# Patient Record
Sex: Female | Born: 1950 | Race: White | Hispanic: No | Marital: Single | Smoking: Former smoker
Health system: Southern US, Community
[De-identification: ages and names within clinical notes are randomized; demographics above are authoritative.]

## PROBLEM LIST (undated history)

## (undated) DIAGNOSIS — G629 Polyneuropathy, unspecified: Secondary | ICD-10-CM

## (undated) DIAGNOSIS — R0602 Shortness of breath: Secondary | ICD-10-CM

## (undated) DIAGNOSIS — I509 Heart failure, unspecified: Secondary | ICD-10-CM

## (undated) DIAGNOSIS — J45909 Unspecified asthma, uncomplicated: Secondary | ICD-10-CM

## (undated) DIAGNOSIS — Z9289 Personal history of other medical treatment: Secondary | ICD-10-CM

## (undated) DIAGNOSIS — R000 Tachycardia, unspecified: Secondary | ICD-10-CM

## (undated) DIAGNOSIS — G8929 Other chronic pain: Secondary | ICD-10-CM

## (undated) DIAGNOSIS — J449 Chronic obstructive pulmonary disease, unspecified: Secondary | ICD-10-CM

## (undated) DIAGNOSIS — I10 Essential (primary) hypertension: Secondary | ICD-10-CM

## (undated) DIAGNOSIS — M1990 Unspecified osteoarthritis, unspecified site: Secondary | ICD-10-CM

## (undated) DIAGNOSIS — F419 Anxiety disorder, unspecified: Secondary | ICD-10-CM

## (undated) DIAGNOSIS — IMO0002 Reserved for concepts with insufficient information to code with codable children: Secondary | ICD-10-CM

## (undated) DIAGNOSIS — I4891 Unspecified atrial fibrillation: Secondary | ICD-10-CM

## (undated) DIAGNOSIS — E785 Hyperlipidemia, unspecified: Secondary | ICD-10-CM

## (undated) HISTORY — DX: Unspecified atrial fibrillation: I48.91

## (undated) HISTORY — DX: Other chronic pain: G89.29

## (undated) HISTORY — DX: Reserved for concepts with insufficient information to code with codable children: IMO0002

## (undated) HISTORY — DX: Hyperlipidemia, unspecified: E78.5

## (undated) HISTORY — DX: Tachycardia, unspecified: R00.0

## (undated) HISTORY — DX: Essential (primary) hypertension: I10

## (undated) HISTORY — PX: OTHER SURGICAL HISTORY: SHX169

## (undated) HISTORY — PX: ABDOMINAL HYSTERECTOMY: SHX81

## (undated) HISTORY — DX: Personal history of other medical treatment: Z92.89

## (undated) HISTORY — DX: Unspecified osteoarthritis, unspecified site: M19.90

## (undated) HISTORY — PX: CARPAL TUNNEL RELEASE: SHX101

## (undated) HISTORY — DX: Chronic obstructive pulmonary disease, unspecified: J44.9

## (undated) HISTORY — DX: Anxiety disorder, unspecified: F41.9

## (undated) HISTORY — DX: Unspecified asthma, uncomplicated: J45.909

## (undated) HISTORY — PX: TUBAL LIGATION: SHX77

## (undated) HISTORY — PX: TONSILLECTOMY: SUR1361

---

## 2002-04-16 ENCOUNTER — Encounter: Payer: Self-pay | Admitting: Emergency Medicine

## 2002-04-16 ENCOUNTER — Emergency Department (HOSPITAL_COMMUNITY): Admission: EM | Admit: 2002-04-16 | Discharge: 2002-04-17 | Payer: Self-pay | Admitting: Emergency Medicine

## 2002-08-14 ENCOUNTER — Encounter: Payer: Self-pay | Admitting: Family Medicine

## 2002-08-14 ENCOUNTER — Ambulatory Visit (HOSPITAL_COMMUNITY): Admission: RE | Admit: 2002-08-14 | Discharge: 2002-08-14 | Payer: Self-pay | Admitting: Family Medicine

## 2003-01-03 ENCOUNTER — Encounter: Payer: Self-pay | Admitting: Family Medicine

## 2003-01-03 ENCOUNTER — Ambulatory Visit (HOSPITAL_COMMUNITY): Admission: RE | Admit: 2003-01-03 | Discharge: 2003-01-03 | Payer: Self-pay | Admitting: Family Medicine

## 2003-01-25 ENCOUNTER — Encounter (HOSPITAL_COMMUNITY): Admission: RE | Admit: 2003-01-25 | Discharge: 2003-02-24 | Payer: Self-pay | Admitting: Specialist

## 2003-09-03 ENCOUNTER — Ambulatory Visit (HOSPITAL_COMMUNITY): Admission: RE | Admit: 2003-09-03 | Discharge: 2003-09-03 | Payer: Self-pay | Admitting: Family Medicine

## 2003-09-07 ENCOUNTER — Ambulatory Visit (HOSPITAL_COMMUNITY): Admission: RE | Admit: 2003-09-07 | Discharge: 2003-09-07 | Payer: Self-pay | Admitting: Family Medicine

## 2004-01-25 ENCOUNTER — Emergency Department (HOSPITAL_COMMUNITY): Admission: EM | Admit: 2004-01-25 | Discharge: 2004-01-26 | Payer: Self-pay | Admitting: Emergency Medicine

## 2004-05-22 ENCOUNTER — Ambulatory Visit (HOSPITAL_COMMUNITY)
Admission: RE | Admit: 2004-05-22 | Discharge: 2004-05-22 | Payer: Self-pay | Admitting: Physical Medicine and Rehabilitation

## 2005-01-29 ENCOUNTER — Ambulatory Visit (HOSPITAL_COMMUNITY)
Admission: RE | Admit: 2005-01-29 | Discharge: 2005-01-29 | Payer: Self-pay | Admitting: Physical Medicine and Rehabilitation

## 2005-07-27 ENCOUNTER — Ambulatory Visit (HOSPITAL_COMMUNITY): Admission: RE | Admit: 2005-07-27 | Discharge: 2005-07-27 | Payer: Self-pay | Admitting: Neurosurgery

## 2005-07-29 ENCOUNTER — Ambulatory Visit (HOSPITAL_COMMUNITY): Admission: RE | Admit: 2005-07-29 | Discharge: 2005-07-29 | Payer: Self-pay | Admitting: Neurosurgery

## 2007-02-21 ENCOUNTER — Emergency Department (HOSPITAL_COMMUNITY): Admission: EM | Admit: 2007-02-21 | Discharge: 2007-02-21 | Payer: Self-pay | Admitting: Emergency Medicine

## 2007-03-30 ENCOUNTER — Ambulatory Visit: Payer: Self-pay | Admitting: Orthopedic Surgery

## 2008-02-21 ENCOUNTER — Emergency Department (HOSPITAL_COMMUNITY): Admission: EM | Admit: 2008-02-21 | Discharge: 2008-02-21 | Payer: Self-pay | Admitting: Emergency Medicine

## 2008-05-30 ENCOUNTER — Ambulatory Visit (HOSPITAL_COMMUNITY): Admission: RE | Admit: 2008-05-30 | Discharge: 2008-05-30 | Payer: Self-pay | Admitting: Internal Medicine

## 2008-06-11 ENCOUNTER — Emergency Department (HOSPITAL_COMMUNITY): Admission: EM | Admit: 2008-06-11 | Discharge: 2008-06-11 | Payer: Self-pay | Admitting: Emergency Medicine

## 2008-09-14 ENCOUNTER — Inpatient Hospital Stay (HOSPITAL_COMMUNITY): Admission: EM | Admit: 2008-09-14 | Discharge: 2008-09-15 | Payer: Self-pay | Admitting: Emergency Medicine

## 2009-03-06 ENCOUNTER — Encounter: Admission: RE | Admit: 2009-03-06 | Discharge: 2009-03-06 | Payer: Self-pay | Admitting: Neurosurgery

## 2009-05-10 ENCOUNTER — Emergency Department (HOSPITAL_COMMUNITY): Admission: EM | Admit: 2009-05-10 | Discharge: 2009-05-10 | Payer: Self-pay | Admitting: Emergency Medicine

## 2009-11-16 IMAGING — RF IR MYELOGRAM [PERSON_NAME]
13 of 22 series · 13 of 22 positions shown · IV contrast (omnipaque)
Comparison: MRI examination lumbar spine 02/21/2008
COMPARISON: None

MYELOGRAM  INJECTION
TECHNIQUE: Informed consent was obtained from the patient prior to
the procedure, including potential complications of headache,
allergy, infection and pain. Specific instructions were given
regarding 24 hour bedrest post procedure to prevent post-LP
headache.  A timeout procedure was performed.  With the patient
prone, the lower back was prepped with Betadine.  1% Lidocaine was
used for local anesthesia.  Lumbar puncture was performed by the
radiologist at the L3 level using a 22 gauge needle with return of
clear CSF.  15 cc of Omnipaque 180 was injected into the
subarachnoid space .
CLINICAL DATA: The patient has undergone previous surgery.  She
has low back pain predominantly extending down her left leg.
TECHNIQUE: Multidetector CT imaging of the lumbar spine was
performed following myelography.  Multiplanar CT image
reconstructions were also generated.
CLINICAL DATA: Neck pain extending into the left arm.
TECHNIQUE: Multidetector CT imaging of the cervical spine was

[Series 1: (hospital) · 1 of 1 slices shown]
[im 1/1]
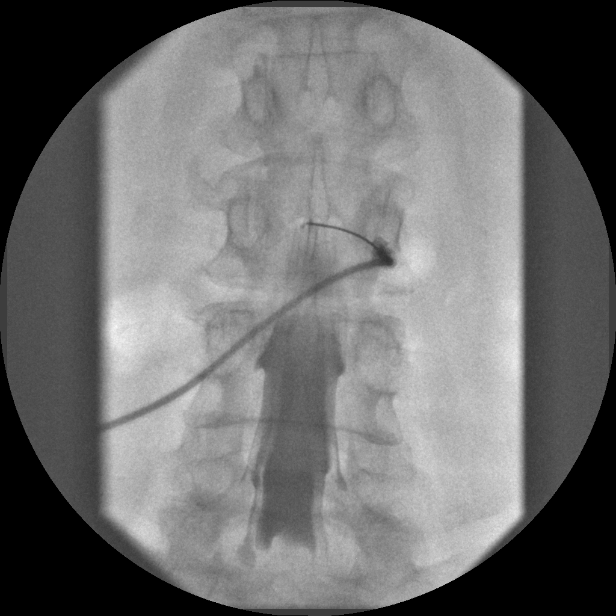

[Series 3: myelogram  white · 1 of 1 slices shown (1 of 12)]
[im 1/1]
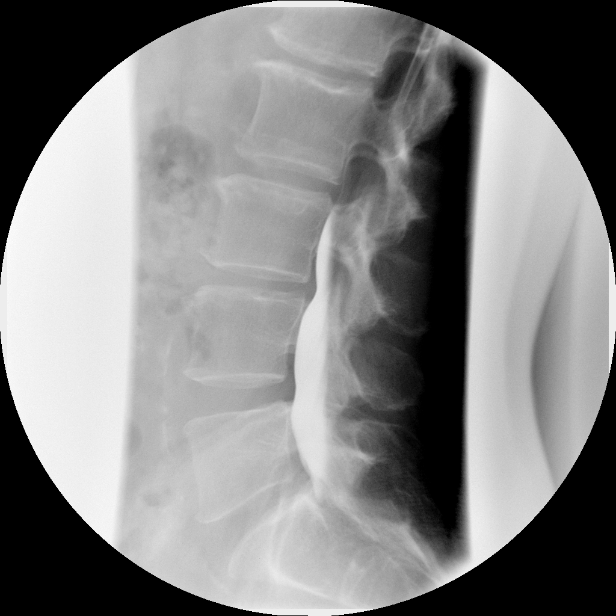

[Series 5: myelogram  white · 1 of 1 slices shown (2 of 12)]
[im 1/1]
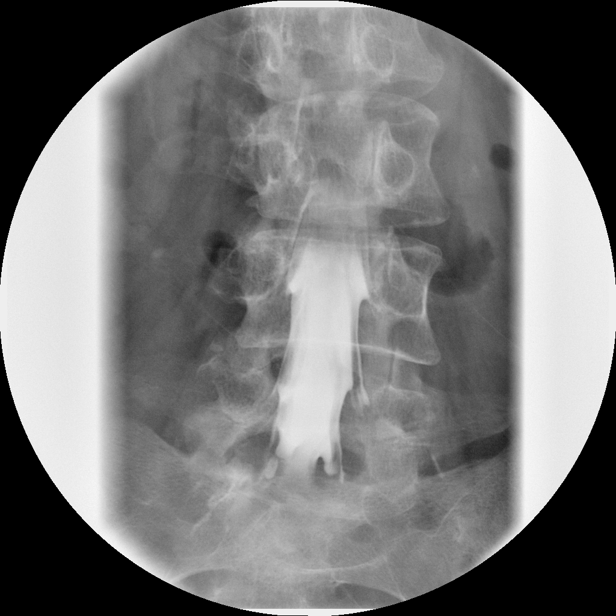

[Series 6: myelogram  white · 1 of 1 slices shown (3 of 12)]
[im 1/1]
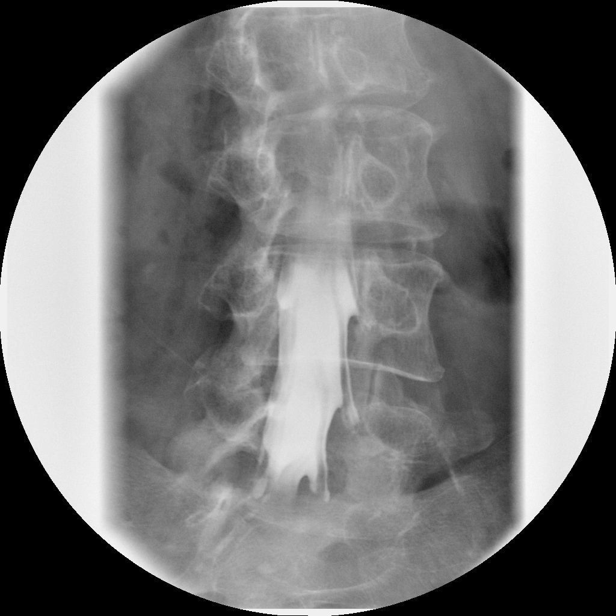

[Series 8: myelogram  white · 1 of 1 slices shown (4 of 12)]
[im 1/1]
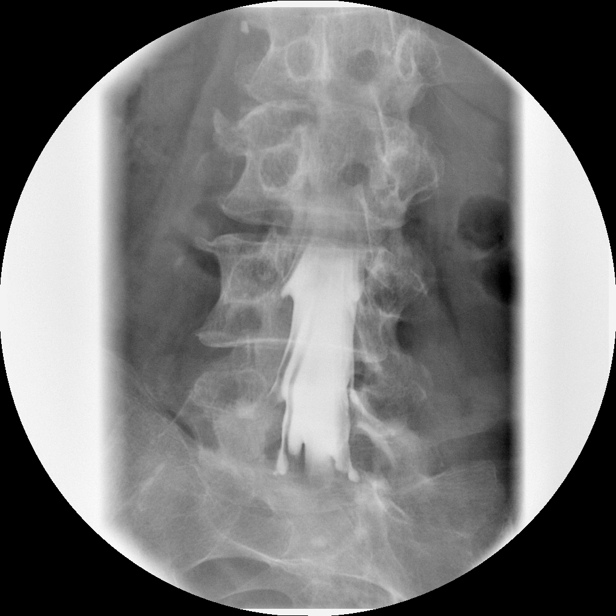

[Series 10: myelogram  white · 1 of 1 slices shown (5 of 12)]
[im 1/1]
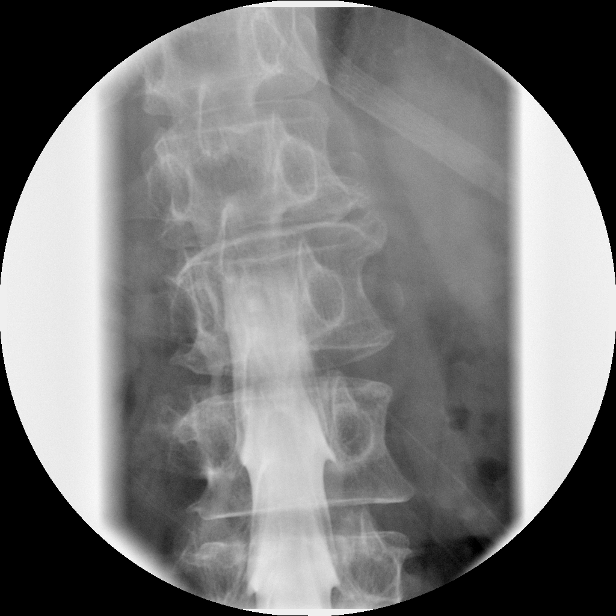

[Series 12: myelogram  white · 1 of 1 slices shown (6 of 12)]
[im 1/1]
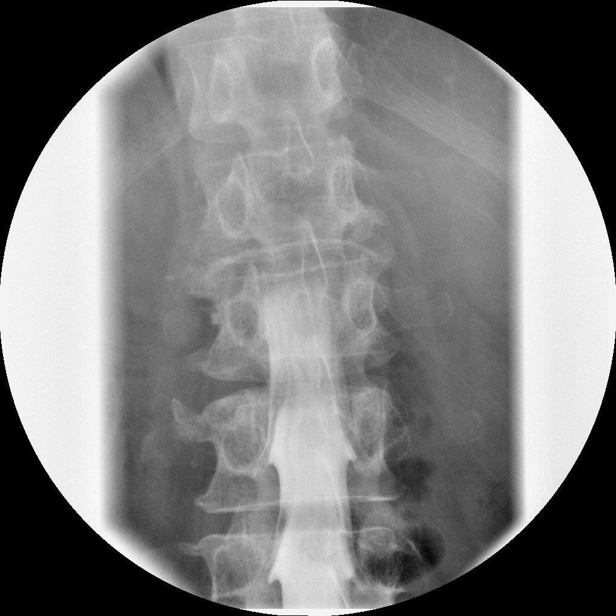

[Series 13: myelogram  white · 1 of 1 slices shown (7 of 12)]
[im 1/1]
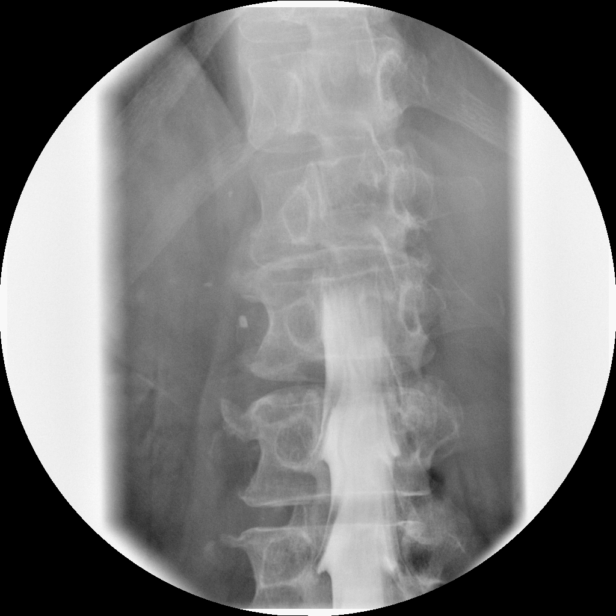

[Series 15: myelogram  white · 1 of 1 slices shown (8 of 12)]
[im 1/1]
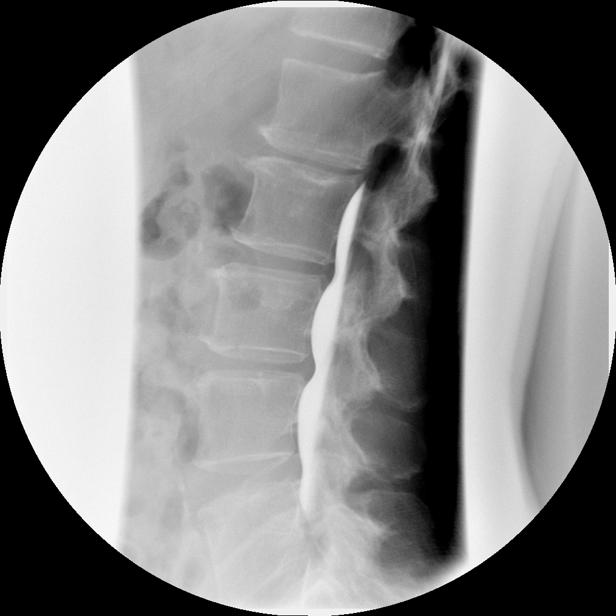

[Series 17: myelogram  white · 1 of 1 slices shown (9 of 12)]
[im 1/1]
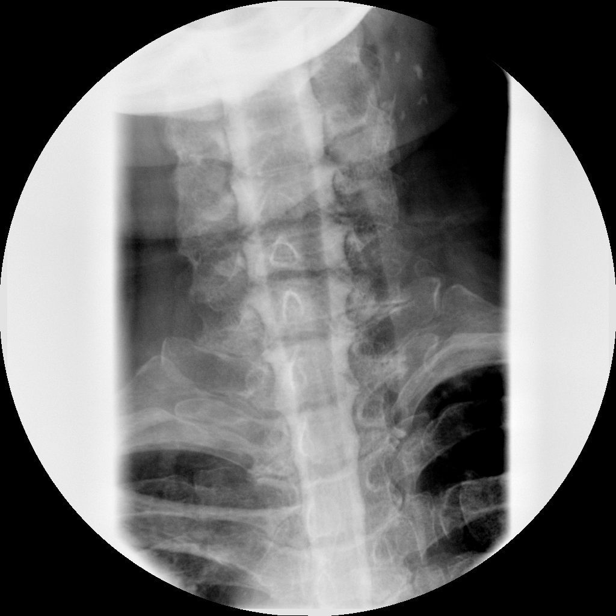

[Series 18: myelogram  white · 1 of 1 slices shown (10 of 12)]
[im 1/1]
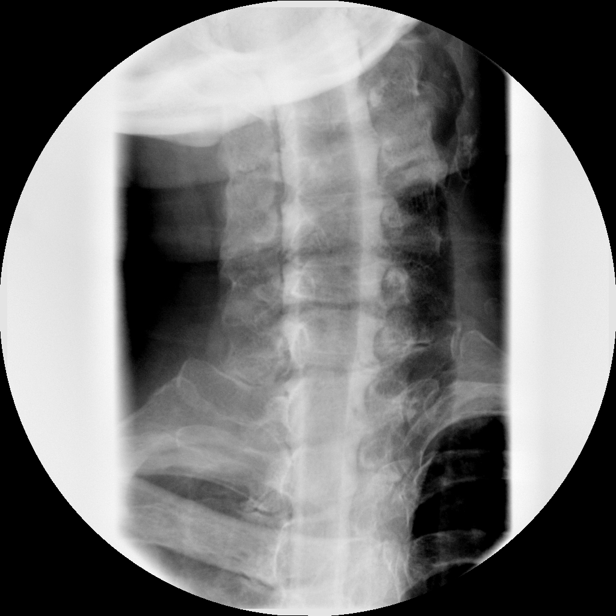

[Series 20: myelogram  white · 1 of 1 slices shown (11 of 12)]
[im 1/1]
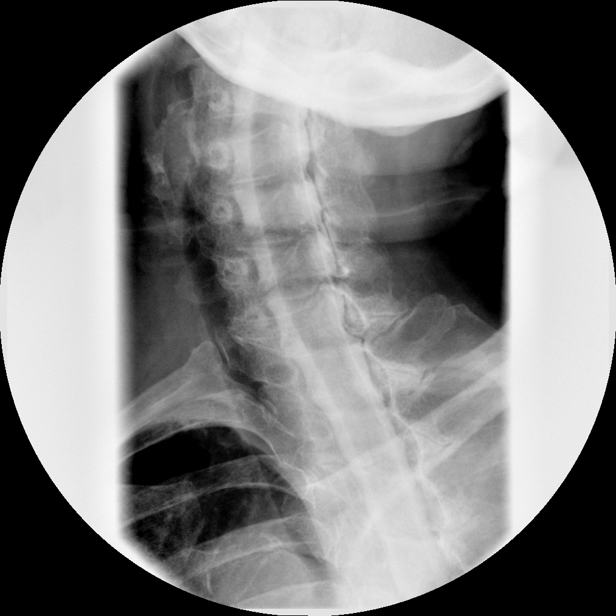

[Series 22: myelogram  white · 1 of 1 slices shown (12 of 12)]
[im 1/1]
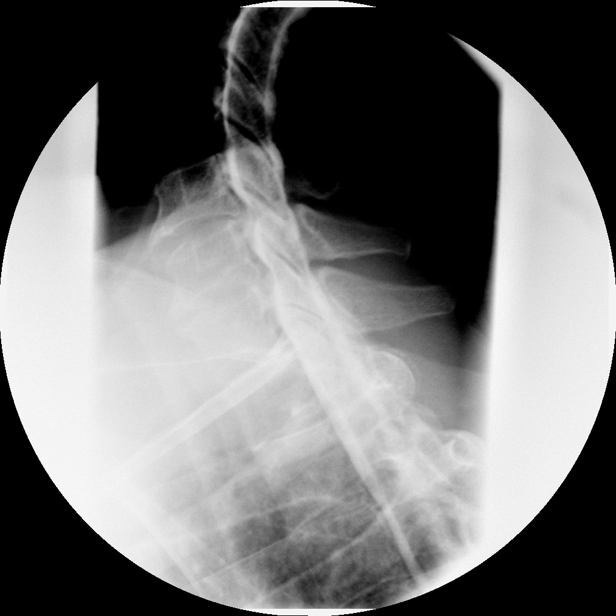

[13 of 22 positions shown; findings below may reference images not displayed]

IMPRESSION: Successful injection of  intrathecal contrast for myelography.

MYELOGRAM LUMBAR
FINDINGS: Disc protrusions L3-L4, L4-L5, L5-S1.  There is no
appreciable listhesis.  The nerve roots fill normally.  No
extradural defects.

Fluoroscopy Time: 2.04 minutes
IMPRESSION: 1.  Mild degenerative changes.  No extradural defects identified.
CT to follow.

CT MYELOGRAPHY LUMBAR SPINE
FINDINGS: Diffuse degenerative disc disease most notable L1-L2, L2-
L3, L3-L4.  At L5-S1, disc material extends into the right L5
foramen.

L1-2: Mild facet degenerative changes.

L2-3: Disc bulge with facet degenerative changes.

L3-4: Diffuse disc bulge.  Mild facet degenerative changes.

L4-5: Mild facet degenerative changes.  No central or foraminal
stenosis.

L5-S1: Right foraminal disc protrusion.  This abuts the L5 root.
This corresponds to the finding seen on the patient's MR
examination.  Facet degenerative changes bilaterally without
central stenosis.
IMPRESSION: 1. L5-S1 right foraminal disc protrusion.  This is similar
appearance to the MR examination.
2.  Mild diffuse degenerative changes.  No central or foraminal
stenosis.

MYELOGRAM CERVICAL
FINDINGS: Truncation of the C5 and C6 roots bilaterally.
Degenerative disc disease is appreciated at the C5-C6 level.  Disc
height loss with post uncinate spurring and central ventral defect
is identified.  The ventral defect is also appreciated at C6-C7.

Fluoroscopy Time: 2.04 minutes
IMPRESSION: Degenerative disc disease C5-C6, C6-C7.  Truncation of the
corresponding root sleeves.  CT to follow.

CT MYELOGRAPHY CERVICAL SPINE
The individual disc spaces were examined as follows:

C2-3:  Degenerative disc disease C5-C6, C6-C7 with post uncinate
spurring.  Mild osseous encroachment left C5-C6 and C6-C7 foramina.
On the right, there is no significant osseous encroachment.

C3-4:  Left-sided facet degenerative changes.

C4-5:  Facet degenerative changes right greater than left.

C5-6:  Left paracentral disc protrusion with uncinate spurring.
This does result in truncation of the left C6 root sleeve
centrally.  The right foramen is widely patent.  No central
stenosis.

C6-7:  Uncinate spurring results in mild truncation of the root
sleeves bilaterally.  No appreciable foraminal stenosis.  Shallow
central disc protrusion without mass effect.

C7-T1:  Facet degenerative changes.
IMPRESSION: 1.  C5-C6 disc osteophyte complex left results in truncation of the
left C6 root sleeve.
2.  C6-C7 uncinate spurring.  Mild truncation of the root sleeves
without appreciable foraminal stenosis.

## 2010-01-14 ENCOUNTER — Emergency Department (HOSPITAL_COMMUNITY): Admission: EM | Admit: 2010-01-14 | Discharge: 2010-01-14 | Payer: Self-pay | Admitting: Emergency Medicine

## 2011-01-18 ENCOUNTER — Emergency Department (HOSPITAL_COMMUNITY): Payer: MEDICARE

## 2011-01-18 ENCOUNTER — Emergency Department (HOSPITAL_COMMUNITY)
Admission: EM | Admit: 2011-01-18 | Discharge: 2011-01-18 | Disposition: A | Discharge status: Home / Self Care | Payer: MEDICARE | Attending: Emergency Medicine | Admitting: Emergency Medicine

## 2011-01-18 DIAGNOSIS — G8929 Other chronic pain: Secondary | ICD-10-CM | POA: Insufficient documentation

## 2011-01-18 DIAGNOSIS — M25559 Pain in unspecified hip: Secondary | ICD-10-CM | POA: Insufficient documentation

## 2011-01-18 DIAGNOSIS — M549 Dorsalgia, unspecified: Secondary | ICD-10-CM | POA: Insufficient documentation

## 2011-03-10 NOTE — H&P (Signed)
Mary [name], SPEZIA                   ACCOUNT NO.:  [id number]   MEDICAL RECORD NO.:  [id number]          PATIENT TYPE:  INP   LOCATION:  [id number]                          FACILITY:  [facility]   PHYSICIAN:  Barbette Merino, M.D.      DATE OF BIRTH:  04-08-1951   DATE OF ADMISSION:  09/14/2008  DATE OF DISCHARGE:  11/21/2009LH                              HISTORY & PHYSICAL   PRIMARY CARE PHYSICIAN:  Dr. Gerarda Fraction, cardiologist,  [facility].   PRESENTING COMPLAINT:  Chest pain and diaphoresis.   HISTORY OF PRESENT ILLNESS:  The patient is a 60 year old diabetic,  hypertensive, and with dyslipidemia who also has history of panic  attacks that woke up this morning with severe chest discomfort,  diaphoresis, and felt like she was going to pass out.  The patient has  had episodes of panic attacks, but they have not been this bad.  She  was worried and called her family members who brought her to the  hospital.  She denied any radiation of her pain.  Denied any headache.  No cough, but she did have some shortness of breath.  Denied any GI  symptoms and no fever.   PAST MEDICAL HISTORY:  Significant for the diabetes, hypertension,  dyslipidemia, panic attacks, degenerative disk disease, COPD, tobacco  abuse, previous chest pain about 7 years ago where she had a negative  stress test.   ALLERGIES:  She is allergic to BENADRYL.   MEDICATIONS:  1. Glucophage 500 mg daily.  2. Aspirin 81 mg daily.  3. Xanax 0.5 mg p.o. p.r.n.  4. Glipizide 2.5 mg daily.  5. Percocet 7.5/325 one tablet daily.  6. Albuterol inhaler.  7. Pravastatin 40 mg daily.   SOCIAL HISTORY:  The patient lives at home.  She lives alone.  Denied  any alcohol use.  No IV drug use.  She smokes about half-to-one pack per  day.   FAMILY HISTORY:  Significant for coronary artery disease and diabetes.   REVIEW OF SYSTEMS:  A 12-point review of systems is negative except per  HPI.   PHYSICAL EXAMINATION:   VITAL SIGNS:  Temperature is 98.1, blood pressure  122/57, pulse is 98, respiratory rate 20, and her sats 100% on room air.  GENERAL:  She is awake, alert, and oriented, in no acute distress, but  looks anxious.  HEENT:  PERRL.  EOMI.  NECK:  Supple.  No JVD, no lymphadenopathy.  RESPIRATORY:  She has good air entry bilaterally.  No wheezes, no rales.  CARDIOVASCULAR SYSTEM:  The patient has S1 and S2.  No murmur.  ABDOMEN:  Soft, nontender with positive bowel sounds.  EXTREMITIES:  No edema, cyanosis, or clubbing.   LABORATORY DATA:  White count of 11.2, hemoglobin 15.2 with platelet  count of 211.  She has normal differentials.  PT 13.1, INR 1.0.  Sodium  142, potassium 4.1, chloride 105, CO2 29, glucose 133, BUN 7, and  creatinine 0.52.  Initial cardiac enzymes are negative.  EKG is normal  sinus rhythm with no significant ST-T wave  changes.  Chest x-ray showed  minimal chronic bronchitic changes.   ASSESSMENT:  Therefore, this is a 60 year old female with significant  risk factors for coronary artery disease presenting with chest pain.  Her risk factors include diabetes, hypertension, tobacco smoking, family  history, and dyslipidemia.  The differential is panic attacks as the  patient has ongoing panic attacks and that could have caused the  patient's symptoms today.   PLAN:  1. Chest pain.  We will admit the patient for rule out.  She will be      in full observation.  If her cardiac enzymes come back as normal,      we will still schedule her with [facility] to have her stress test.  The patient has significant risk      factors for coronary artery disease and requires that she gets      further testing.  2. Diabetes.  We will check hemoglobin A1c and also continue with her      medications.  We will hold her metformin while in hospital, give      her glipizide instead of 2.5 I will give her 5 mg in the interim.  3. Dyslipidemia.  I will  check fasting lipid panel again and continue      with her current medications, mainly pravastatin.  4. Other medical problems are quite stable at this point and I will      continue with her home regimen.  I will discharge the patient when      she is finally stable.  We will try and get a 2D echo also,      possibly prior to discharge.      Barbette Merino, M.D.  Electronically Signed     LG/MEDQ  D:  09/15/2008  T:  09/15/2008  Job:  [id number]

## 2011-03-11 ENCOUNTER — Encounter (HOSPITAL_COMMUNITY): Payer: Self-pay

## 2011-03-11 ENCOUNTER — Other Ambulatory Visit (HOSPITAL_COMMUNITY): Payer: Self-pay | Admitting: Cardiovascular Disease

## 2011-03-11 ENCOUNTER — Ambulatory Visit (HOSPITAL_COMMUNITY)
Admission: RE | Admit: 2011-03-11 | Discharge: 2011-03-11 | Disposition: A | Discharge status: Home / Self Care | Payer: MEDICARE | Source: Ambulatory Visit | Attending: Cardiovascular Disease | Admitting: Cardiovascular Disease

## 2011-03-11 DIAGNOSIS — R0602 Shortness of breath: Secondary | ICD-10-CM | POA: Insufficient documentation

## 2011-03-11 DIAGNOSIS — F17200 Nicotine dependence, unspecified, uncomplicated: Secondary | ICD-10-CM | POA: Insufficient documentation

## 2011-05-26 ENCOUNTER — Encounter: Payer: Self-pay | Admitting: Family Medicine

## 2011-05-26 ENCOUNTER — Ambulatory Visit (INDEPENDENT_AMBULATORY_CARE_PROVIDER_SITE_OTHER): Payer: Medicare Other | Admitting: Family Medicine

## 2011-05-26 VITALS — BP 110/70 | HR 116 | Ht 67.0 in | Wt 114.0 lb

## 2011-05-26 DIAGNOSIS — IMO0002 Reserved for concepts with insufficient information to code with codable children: Secondary | ICD-10-CM

## 2011-05-26 DIAGNOSIS — R634 Abnormal weight loss: Secondary | ICD-10-CM | POA: Insufficient documentation

## 2011-05-26 DIAGNOSIS — E785 Hyperlipidemia, unspecified: Secondary | ICD-10-CM

## 2011-05-26 DIAGNOSIS — I4891 Unspecified atrial fibrillation: Secondary | ICD-10-CM

## 2011-05-26 DIAGNOSIS — E119 Type 2 diabetes mellitus without complications: Secondary | ICD-10-CM

## 2011-05-26 DIAGNOSIS — G8929 Other chronic pain: Secondary | ICD-10-CM

## 2011-05-26 LAB — COMPREHENSIVE METABOLIC PANEL
Alkaline Phosphatase: 44 U/L (ref 39–117)
BUN: 11 mg/dL (ref 6–23)
Calcium: 10.4 mg/dL (ref 8.4–10.5)
Chloride: 102 mEq/L (ref 96–112)
Potassium: 4.6 mEq/L (ref 3.5–5.3)
Sodium: 139 mEq/L (ref 135–145)

## 2011-05-26 LAB — HEMOGLOBIN A1C: Mean Plasma Glucose: 143 mg/dL — ABNORMAL HIGH (ref <117)

## 2011-05-26 LAB — TSH: TSH: 0.805 u[IU]/mL (ref 0.350–4.500)

## 2011-05-26 MED ORDER — ALBUTEROL SULFATE HFA 108 (90 BASE) MCG/ACT IN AERS: 2.0000 | INHALATION_SPRAY | Freq: Four times a day (QID) | RESPIRATORY_TRACT | Status: DC | PRN

## 2011-05-26 MED ORDER — OXYCODONE-ACETAMINOPHEN 5-325 MG PO TABS: 1.0000 | ORAL_TABLET | Freq: Four times a day (QID) | ORAL | Status: DC | PRN

## 2011-05-26 NOTE — Patient Instructions (Signed)
Please reschedule your colonoscopy this is very important Continue your current medications We will call your lab results Next visit in 1 month to discuss your sleep and mood

## 2011-05-26 NOTE — Progress Notes (Signed)
  Subjective:    Patient ID: Mary [name], female    DOB: 02-Jul-1951, 60 y.o.   MRN: [id number]  HPI  Here to establish care Weight loss- has lost approx 80 pounds in 8 months, pt was having work-up with her previous PCP, mammogram was normal, CXR- neg, labs unremarkablecolonscopy- has not been done, pt states weight loss is unintentional  A fib- followed by Dr. Claiborne Billings, last visit in June, currently on Pradaxa and ASA 81mg , feels heart fluttering occ, espc when she is anxious  Hyperlipidemia- pravastatin was increased to 80mg   A day by Dr, Claiborne Billings at last visit  Anxiety-Multiple deaths in family,  daughter passed in April, pt very upset about this , feels very anxious and feels like she has panic attacks, has been on Xanax, does not feel she is depressed and does not want to take daily medication at this time, denies SI, but is upset she only has one brother left in family and he is very sick Chronic pain- needs refill on meds, chronic back and leg pain, reviewed medical records  Review of Systems - per above, +fatigue and weakness, denies N/V, CP, SOB occ, denies change in bowel or bladder, no recent injury or illness     Objective:   Physical Exam GEN-Crying when discussing her history and family, no cardiopulomonary distress, alert and oriented Neck- no carotid bruit HEENT- PERRL, EOMI, patial dentures on bottom, edentoulous on top row, MMM CVS- Irregular, Irregular rhythem, tachycardic (HR 110) RSP-CTAB ABD- Soft, NT, ND, NABS, no masses felt EXT- no edema Psych- crying during visit,appears anxious, no evidence of SI or hallucinations      Assessment & Plan:   No problem-specific assessment & plan notes found for this encounter.

## 2011-05-27 ENCOUNTER — Encounter: Payer: Self-pay | Admitting: Family Medicine

## 2011-05-27 ENCOUNTER — Telehealth: Payer: Self-pay | Admitting: *Deleted

## 2011-05-27 DIAGNOSIS — G8929 Other chronic pain: Secondary | ICD-10-CM | POA: Insufficient documentation

## 2011-05-27 MED ORDER — SODIUM CHLORIDE 0.45 % IV SOLN: Freq: Once | INTRAVENOUS | Status: DC

## 2011-05-27 NOTE — Assessment & Plan Note (Signed)
Obtain A1C with labs No change to meds

## 2011-05-27 NOTE — Assessment & Plan Note (Signed)
Check Direct LDL on statin and LFT

## 2011-05-27 NOTE — Assessment & Plan Note (Addendum)
Placed on pain contract Refilled Perocoet If her pain is not controlled will send to pain management

## 2011-05-27 NOTE — Assessment & Plan Note (Signed)
No rate controlling meds taken this AM, explained importance of these meds Continue Pradaxa per cards

## 2011-05-27 NOTE — Telephone Encounter (Signed)
Message copied by Christia Reading on Wed May 27, 2011  1:43 PM ------      Message from: Vic Blackbird F      Created: Wed May 27, 2011 11:47 AM      Regarding: Lab results       Please let Ms. Bick know that her labs were normal.      Her thyroid level, kidney function, liver test were normal.      Her A1C was 6.6 which is very good- keep taking the same medications for diabetes      Her cholesterol number was good      No change to medications at this time      She needs to schedule the colonscopy

## 2011-05-27 NOTE — Assessment & Plan Note (Signed)
Unclear cause but she has multiple Co-morbidites, she needs to have Colonoscopy done and this will be rescheduled by pt CXR neg, in smoker Normal Mammo 4/12 per records Will obtain TSH, routine labs  Her weight may also be contributed to her mood

## 2011-05-28 ENCOUNTER — Ambulatory Visit (HOSPITAL_COMMUNITY): Admission: RE | Admit: 2011-05-28 | Payer: Medicare Other | Source: Ambulatory Visit | Admitting: Internal Medicine

## 2011-05-28 ENCOUNTER — Encounter (INDEPENDENT_AMBULATORY_CARE_PROVIDER_SITE_OTHER): Payer: Medicare Other | Admitting: Internal Medicine

## 2011-05-28 ENCOUNTER — Encounter (HOSPITAL_COMMUNITY): Admission: RE | Payer: Self-pay | Source: Ambulatory Visit

## 2011-05-28 ENCOUNTER — Telehealth: Payer: Self-pay | Admitting: Family Medicine

## 2011-05-28 SURGERY — COLONOSCOPY
Anesthesia: Moderate Sedation

## 2011-05-28 NOTE — Telephone Encounter (Signed)
Message copied by Christia Reading on Thu May 28, 2011  8:59 AM ------      Message from: Vic Blackbird F      Created: Wed May 27, 2011 11:47 AM      Regarding: Lab results       Please let Ms. Care know that her labs were normal.      Her thyroid level, kidney function, liver test were normal.      Her A1C was 6.6 which is very good- keep taking the same medications for diabetes      Her cholesterol number was good      No change to medications at this time      She needs to schedule the colonscopy

## 2011-06-26 ENCOUNTER — Encounter: Payer: Self-pay | Admitting: Family Medicine

## 2011-06-26 ENCOUNTER — Telehealth: Payer: Self-pay | Admitting: Family Medicine

## 2011-06-26 ENCOUNTER — Ambulatory Visit (INDEPENDENT_AMBULATORY_CARE_PROVIDER_SITE_OTHER): Payer: Medicare Other | Admitting: Family Medicine

## 2011-06-26 DIAGNOSIS — Z23 Encounter for immunization: Secondary | ICD-10-CM

## 2011-06-26 DIAGNOSIS — F419 Anxiety disorder, unspecified: Secondary | ICD-10-CM

## 2011-06-26 DIAGNOSIS — G8929 Other chronic pain: Secondary | ICD-10-CM

## 2011-06-26 DIAGNOSIS — E119 Type 2 diabetes mellitus without complications: Secondary | ICD-10-CM

## 2011-06-26 DIAGNOSIS — J449 Chronic obstructive pulmonary disease, unspecified: Secondary | ICD-10-CM

## 2011-06-26 DIAGNOSIS — F411 Generalized anxiety disorder: Secondary | ICD-10-CM

## 2011-06-26 DIAGNOSIS — Z293 Encounter for prophylactic fluoride administration: Secondary | ICD-10-CM

## 2011-06-26 MED ORDER — ALBUTEROL SULFATE HFA 108 (90 BASE) MCG/ACT IN AERS: 2.0000 | INHALATION_SPRAY | RESPIRATORY_TRACT | Status: DC | PRN

## 2011-06-26 MED ORDER — OXYCODONE-ACETAMINOPHEN 5-325 MG PO TABS: 1.0000 | ORAL_TABLET | Freq: Four times a day (QID) | ORAL | Status: DC | PRN

## 2011-06-26 MED ORDER — INFLUENZA VAC TYPES A & B PF IM SUSP: 0.5000 mL | Freq: Once | INTRAMUSCULAR | Status: DC

## 2011-06-26 MED ORDER — ALPRAZOLAM 1 MG PO TABS: 1.0000 mg | ORAL_TABLET | Freq: Two times a day (BID) | ORAL | Status: DC | PRN

## 2011-06-26 NOTE — Telephone Encounter (Signed)
She told me in the room she wanted a symbicort inhaler called in. She already has albuterol

## 2011-06-26 NOTE — Patient Instructions (Addendum)
Stop the Glipizide Continue the Metformin once a day For your evening blood sugar, make sure it is 2 hours after you eat- it should less than 180 Call me if the evening blood sugars are above 200 more than 3 days in a row Your A1C was 6.6%  F/U in November for your diabetes

## 2011-06-26 NOTE — Progress Notes (Signed)
  Subjective:    Patient ID: Mary [name], female    DOB: September 14, 1951, 60 y.o.   MRN: [id number]  HPI  Diabetes Mellitus- CBG fasting  90-140 , tolerating medications, she has been taking 1/4 tablet of the Glipizide , taking Metformin once  a day , notes bedtime CBG can be as low as 60, she states this happens after she takes the Glipizide and it makes her jittery and weak. Labs- reviewed - A1C is 6.6   Afib- started on Rythmol by her cardiologist  Anxiety - needs refill on Xanax and pain medication today. Feels like she is coping okay. Her brother is in  SNF for rehab s/p his amputation. She is trying to hand in there and help him during this time. She thinks going to visit him daily gives her something to do, and gives her purpose, she has also been using prayer and her bible to give her strength. Denies SI, does not feel very depressed at this time.  COPD- asked if she needs symbicort, she does not wheeze very often, is not SOB with daily activites, her friend told her about, no change in cough or sputum production She has rescheduled her colononscopy    Review of Systems        GEN- +weight gain, no recent illness         CVS- no chest pain, improved palpitations         Psych- per above    Objective:   Physical Exam         GEN- NAD, alert and oriented x 3         CVS- Irregular, Irregular rhythem, normal rate         RESP- occ wheeze with forced expiration, otherwise clear         Psych- Not depressed appearing, no overly anxious today, good eye contact, normal speech, normal affect.       Assessment & Plan:

## 2011-06-26 NOTE — Telephone Encounter (Signed)
We discussed this, she can use albuterol for now

## 2011-06-26 NOTE — Telephone Encounter (Signed)
Albuterol was already sent in

## 2011-06-27 ENCOUNTER — Encounter: Payer: Self-pay | Admitting: Family Medicine

## 2011-06-27 DIAGNOSIS — J449 Chronic obstructive pulmonary disease, unspecified: Secondary | ICD-10-CM | POA: Insufficient documentation

## 2011-06-27 DIAGNOSIS — Z9289 Personal history of other medical treatment: Secondary | ICD-10-CM

## 2011-06-27 DIAGNOSIS — F419 Anxiety disorder, unspecified: Secondary | ICD-10-CM | POA: Insufficient documentation

## 2011-06-27 HISTORY — DX: Personal history of other medical treatment: Z92.89

## 2011-06-27 NOTE — Assessment & Plan Note (Signed)
She does not have require typical COPD meds at this time, will monitor as her symptoms are rare, will use albuterol at this time  FLu shot given, if she has more problems would add inhaled corticosteroid

## 2011-06-27 NOTE — Assessment & Plan Note (Signed)
Refilled meds, pt mental state was much improved during todays visit. Encouraged family time as this seems to help

## 2011-06-27 NOTE — Assessment & Plan Note (Signed)
Refilled pain meds 

## 2011-06-27 NOTE — Assessment & Plan Note (Signed)
Will d/c her glipizide secondary to the hypoglycemia, A1C is at goal

## 2011-07-08 ENCOUNTER — Telehealth: Payer: Self-pay | Admitting: Family Medicine

## 2011-07-08 MED ORDER — ZOLPIDEM TARTRATE 10 MG PO TABS: 10.0000 mg | ORAL_TABLET | Freq: Every evening | ORAL | Status: AC | PRN

## 2011-07-08 NOTE — Telephone Encounter (Signed)
Called patient left message

## 2011-07-08 NOTE — Telephone Encounter (Signed)
Patient would like something to help her sleep at night called into [facility]

## 2011-07-08 NOTE — Telephone Encounter (Signed)
I will give her a short course of Ambien, she should only use as needed, not every night

## 2011-07-21 LAB — BASIC METABOLIC PANEL
Calcium: 9.7
Chloride: 101
Creatinine, Ser: 0.47
GFR calc Af Amer: >60
GFR calc non Af Amer: >60

## 2011-07-21 LAB — D-DIMER, QUANTITATIVE: D-Dimer, Quant: 0.29

## 2011-07-21 LAB — CK: Total CK: 47

## 2011-07-22 ENCOUNTER — Telehealth: Payer: Self-pay | Admitting: Family Medicine

## 2011-07-23 NOTE — Telephone Encounter (Signed)
Patient notified

## 2011-07-23 NOTE — Telephone Encounter (Signed)
Can she pick up rx on Friday?

## 2011-07-23 NOTE — Telephone Encounter (Signed)
She can pick it up Friday

## 2011-07-24 MED ORDER — OXYCODONE-ACETAMINOPHEN 5-325 MG PO TABS: 1.0000 | ORAL_TABLET | Freq: Four times a day (QID) | ORAL | Status: DC | PRN

## 2011-07-24 NOTE — Telephone Encounter (Signed)
Addended by: Vic Blackbird F on: 07/24/2011 08:06 AM   Modules accepted: Orders

## 2011-07-28 LAB — CBC
HCT: 36.3
HCT: 45.9
Hemoglobin: 12.2
Hemoglobin: 15.2 — ABNORMAL HIGH
MCHC: 33.2
MCV: 92
Platelets: 158
Platelets: 211
RBC: 4.99
RDW: 13.2
WBC: 11.2 — ABNORMAL HIGH
WBC: 8.9

## 2011-07-28 LAB — DIFFERENTIAL
Basophils Absolute: 0
Basophils Absolute: 0.1
Basophils Relative: 0
Basophils Relative: 1
Eosinophils Absolute: 0
Eosinophils Absolute: 0.1
Eosinophils Relative: 0
Eosinophils Relative: 1
Lymphocytes Relative: 27
Lymphs Abs: 3
Monocytes Absolute: 0.4
Monocytes Absolute: 0.5
Monocytes Relative: 5
Monocytes Relative: 5
Neutro Abs: 4.6
Neutro Abs: 7.6
Neutrophils Relative %: 68

## 2011-07-28 LAB — GLUCOSE, CAPILLARY
Glucose-Capillary: 137 — ABNORMAL HIGH
Glucose-Capillary: 149 — ABNORMAL HIGH
Glucose-Capillary: 156 — ABNORMAL HIGH
Glucose-Capillary: 56 — ABNORMAL LOW

## 2011-07-28 LAB — COMPREHENSIVE METABOLIC PANEL
ALT: 9
Albumin: 3.4 — ABNORMAL LOW
Alkaline Phosphatase: 48
BUN: 8
Chloride: 106
Potassium: 3.5
Sodium: 142
Total Bilirubin: 0.5
Total Protein: 5.5 — ABNORMAL LOW

## 2011-07-28 LAB — LIPID PANEL
Triglycerides: 206 — ABNORMAL HIGH
VLDL: 41 — ABNORMAL HIGH

## 2011-07-28 LAB — CARDIAC PANEL(CRET KIN+CKTOT+MB+TROPI)
CK, MB: 0.5
Relative Index: INVALID
Relative Index: INVALID
Total CK: 19
Total CK: 20
Troponin I: 0.02
Troponin I: 0.02

## 2011-07-28 LAB — BASIC METABOLIC PANEL WITH GFR
BUN: 7
Calcium: 10.2
Creatinine, Ser: 0.52
GFR calc Af Amer: >60
GFR calc non Af Amer: >60

## 2011-07-28 LAB — BASIC METABOLIC PANEL
CO2: 29
Chloride: 105
Glucose, Bld: 133 — ABNORMAL HIGH
Potassium: 4.1
Sodium: 142

## 2011-07-28 LAB — PROTIME-INR
INR: 1
Prothrombin Time: 13.2

## 2011-07-28 LAB — POCT CARDIAC MARKERS
CKMB, poc: <1 — ABNORMAL LOW
Myoglobin, poc: 17.8
Troponin i, poc: <0.05

## 2011-07-28 LAB — TSH: TSH: 0.879

## 2011-07-28 LAB — B-NATRIURETIC PEPTIDE (CONVERTED LAB): Pro B Natriuretic peptide (BNP): 66

## 2011-07-28 LAB — APTT: aPTT: 30

## 2011-07-28 LAB — RAPID URINE DRUG SCREEN, HOSP PERFORMED: Benzodiazepines: POSITIVE — AB

## 2011-08-18 ENCOUNTER — Telehealth: Payer: Self-pay | Admitting: Family Medicine

## 2011-08-19 MED ORDER — OXYCODONE-ACETAMINOPHEN 5-325 MG PO TABS: 1.0000 | ORAL_TABLET | Freq: Four times a day (QID) | ORAL | Status: DC | PRN

## 2011-08-19 NOTE — Telephone Encounter (Signed)
Placed in narcotics drawer

## 2011-08-25 ENCOUNTER — Other Ambulatory Visit: Payer: Self-pay | Admitting: Family Medicine

## 2011-08-26 LAB — CBC WITH DIFFERENTIAL/PLATELET
Basophils Absolute: 0.1 10*3/uL (ref 0.0–0.1)
Basophils Relative: 1 % (ref 0–1)
Eosinophils Relative: 1 % (ref 0–5)
HCT: 38.9 % (ref 36.0–46.0)
Lymphocytes Relative: 37 % (ref 12–46)
MCHC: 32.6 g/dL (ref 30.0–36.0)
Monocytes Absolute: 0.5 10*3/uL (ref 0.1–1.0)
Neutro Abs: 4.1 10*3/uL (ref 1.7–7.7)
Platelets: 162 10*3/uL (ref 150–400)
RDW: 14.1 % (ref 11.5–15.5)
WBC: 7.6 10*3/uL (ref 4.0–10.5)

## 2011-09-02 ENCOUNTER — Encounter: Payer: Self-pay | Admitting: Family Medicine

## 2011-09-03 ENCOUNTER — Ambulatory Visit (INDEPENDENT_AMBULATORY_CARE_PROVIDER_SITE_OTHER): Payer: Medicare Other | Admitting: Family Medicine

## 2011-09-03 ENCOUNTER — Encounter: Payer: Self-pay | Admitting: Family Medicine

## 2011-09-03 DIAGNOSIS — R634 Abnormal weight loss: Secondary | ICD-10-CM

## 2011-09-03 DIAGNOSIS — J449 Chronic obstructive pulmonary disease, unspecified: Secondary | ICD-10-CM

## 2011-09-03 DIAGNOSIS — R5381 Other malaise: Secondary | ICD-10-CM

## 2011-09-03 DIAGNOSIS — E119 Type 2 diabetes mellitus without complications: Secondary | ICD-10-CM

## 2011-09-03 DIAGNOSIS — E785 Hyperlipidemia, unspecified: Secondary | ICD-10-CM

## 2011-09-03 DIAGNOSIS — R5383 Other fatigue: Secondary | ICD-10-CM

## 2011-09-03 DIAGNOSIS — Z23 Encounter for immunization: Secondary | ICD-10-CM

## 2011-09-03 LAB — HEMOGLOBIN A1C
Hgb A1c MFr Bld: 7.4 % — ABNORMAL HIGH (ref <5.7)
Mean Plasma Glucose: 166 mg/dL — ABNORMAL HIGH (ref <117)

## 2011-09-03 LAB — BASIC METABOLIC PANEL
BUN: 8 mg/dL (ref 6–23)
Creat: 0.57 mg/dL (ref 0.50–1.10)
Potassium: 4 mEq/L (ref 3.5–5.3)

## 2011-09-03 NOTE — Patient Instructions (Signed)
F/U with Dr Buelah Manis in 19months  I suggest that you consider an anti depresant   No change in dose of the xanax, this will be refilled  For 3 months. Continue at the same pharmacy you get the other medications at  You need to commit to quitting smoking

## 2011-09-03 NOTE — Progress Notes (Signed)
  Subjective:    Patient ID: Mary [name], female    DOB: February 05, 1951, 60 y.o.   MRN: [id number]  HPI The PT is here for follow up and re-evaluation of chronic medical conditions, medication management and review of any available recent lab and radiology data.  Preventive health is updated, specifically  Cancer screening and Immunization.  Though she insists she had a mammogram this year no record currently in the chart, will f/u.Wants to "put off" colonscopy  Questions or concerns regarding consultations or procedures which the PT has had in the interim are  addressed. The PT denies any adverse reactions to current medications since the last visit.  There are no new concerns.  There are no specific complaints      Review of Systems See HPI Denies recent fever or chills. Denies sinus pressure, nasal congestion, ear pain or sore throat. Denies chest congestion, productive cough or wheezing.Continues to smoke, but trying to quit Denies chest pains, palpitations and leg swelling Denies abdominal pain, nausea, vomiting,diarrhea or constipation.   Denies dysuria, frequency, hesitancy or incontinence. Chronic back pain Denies headaches, seizures, numbness, or tingling. C/o  depression, anxiety and  Insomnia, all unchanged, only wants xanax, refuses antidepressant. Not suicidal or homicidal. Lost her daughter earlier this year at age 56 Denies skin break down or rash.        Objective:   Physical Exam Patient alert and oriented and in no cardiopulmonary distress.  HEENT: No facial asymmetry, EOMI, no sinus tenderness,  oropharynx pink and moist.  Neck supple no adenopathy.  Chest: Decreased air entry throughout, few wheezes, no crackles CVS: S1, S2 no murmurs, no S3.  ABD: Soft non tender. Bowel sounds normal.  Ext: No edema  MS: Adequate though decreased ROM spine, shoulders, hips and knees.  Skin: Intact, no ulcerations or rash noted.  Psych: Good eye contact, normal affect.  Memory intact mildly anxious not  depressed appearing.  CNS: CN 2-12 intact, power, tone and sensation normal throughout.        Assessment & Plan:

## 2011-09-04 MED ORDER — METFORMIN HCL 500 MG PO TABS: 500.0000 mg | ORAL_TABLET | Freq: Two times a day (BID) | ORAL | Status: DC

## 2011-09-04 NOTE — Assessment & Plan Note (Signed)
Uncontrolled, increase metformin to twice daily

## 2011-09-04 NOTE — Assessment & Plan Note (Signed)
Improved, weight gain since last visit

## 2011-09-04 NOTE — Assessment & Plan Note (Signed)
Low fat diet discussed an encouraged, fasting lipid panel past due

## 2011-09-04 NOTE — Assessment & Plan Note (Signed)
Deteriorating with persistence of smoking,trying to quit

## 2011-09-08 NOTE — Progress Notes (Signed)
Addended by: Eual Fines on: 09/08/2011 01:34 PM   Modules accepted: Orders

## 2011-09-15 ENCOUNTER — Telehealth: Payer: Self-pay | Admitting: Family Medicine

## 2011-09-15 NOTE — Telephone Encounter (Signed)
Patient will be able to collect on the 26th.

## 2011-09-21 ENCOUNTER — Telehealth: Payer: Self-pay | Admitting: Family Medicine

## 2011-09-21 ENCOUNTER — Other Ambulatory Visit: Payer: Self-pay

## 2011-09-21 MED ORDER — OXYCODONE-ACETAMINOPHEN 5-325 MG PO TABS: 1.0000 | ORAL_TABLET | Freq: Four times a day (QID) | ORAL | Status: DC | PRN

## 2011-09-21 NOTE — Telephone Encounter (Signed)
Pt aware it is ready

## 2011-10-09 ENCOUNTER — Other Ambulatory Visit: Payer: Self-pay

## 2011-10-09 MED ORDER — OXYCODONE-ACETAMINOPHEN 5-325 MG PO TABS: 1.0000 | ORAL_TABLET | Freq: Four times a day (QID) | ORAL | Status: DC | PRN

## 2011-10-12 ENCOUNTER — Telehealth: Payer: Self-pay | Admitting: Family Medicine

## 2011-10-12 NOTE — Telephone Encounter (Signed)
Aware she can come this week

## 2011-11-17 ENCOUNTER — Telehealth: Payer: Self-pay | Admitting: Family Medicine

## 2011-11-18 ENCOUNTER — Other Ambulatory Visit: Payer: Self-pay

## 2011-11-18 MED ORDER — OXYCODONE-ACETAMINOPHEN 5-325 MG PO TABS: 1.0000 | ORAL_TABLET | Freq: Four times a day (QID) | ORAL | Status: DC | PRN

## 2011-11-18 NOTE — Telephone Encounter (Signed)
Okay to fill? 

## 2011-11-18 NOTE — Telephone Encounter (Signed)
Came and collected

## 2011-11-18 NOTE — Telephone Encounter (Signed)
Can she get it today?

## 2011-12-01 ENCOUNTER — Telehealth: Payer: Self-pay | Admitting: Family Medicine

## 2011-12-01 MED ORDER — ALPRAZOLAM 1 MG PO TABS: 1.0000 mg | ORAL_TABLET | Freq: Two times a day (BID) | ORAL | Status: DC | PRN

## 2011-12-01 NOTE — Telephone Encounter (Signed)
Printed for Dr to sign

## 2011-12-03 ENCOUNTER — Ambulatory Visit: Payer: Medicare Other | Admitting: Family Medicine

## 2011-12-08 ENCOUNTER — Ambulatory Visit (INDEPENDENT_AMBULATORY_CARE_PROVIDER_SITE_OTHER): Payer: Medicare Other | Admitting: Family Medicine

## 2011-12-08 ENCOUNTER — Encounter: Payer: Self-pay | Admitting: Family Medicine

## 2011-12-08 DIAGNOSIS — R634 Abnormal weight loss: Secondary | ICD-10-CM

## 2011-12-08 DIAGNOSIS — F419 Anxiety disorder, unspecified: Secondary | ICD-10-CM

## 2011-12-08 DIAGNOSIS — IMO0002 Reserved for concepts with insufficient information to code with codable children: Secondary | ICD-10-CM

## 2011-12-08 DIAGNOSIS — J449 Chronic obstructive pulmonary disease, unspecified: Secondary | ICD-10-CM

## 2011-12-08 DIAGNOSIS — E119 Type 2 diabetes mellitus without complications: Secondary | ICD-10-CM

## 2011-12-08 DIAGNOSIS — G8929 Other chronic pain: Secondary | ICD-10-CM

## 2011-12-08 DIAGNOSIS — F411 Generalized anxiety disorder: Secondary | ICD-10-CM

## 2011-12-08 DIAGNOSIS — F329 Major depressive disorder, single episode, unspecified: Secondary | ICD-10-CM

## 2011-12-08 DIAGNOSIS — E785 Hyperlipidemia, unspecified: Secondary | ICD-10-CM

## 2011-12-08 DIAGNOSIS — I4891 Unspecified atrial fibrillation: Secondary | ICD-10-CM

## 2011-12-08 DIAGNOSIS — F3289 Other specified depressive episodes: Secondary | ICD-10-CM

## 2011-12-08 DIAGNOSIS — F32A Depression, unspecified: Secondary | ICD-10-CM

## 2011-12-08 MED ORDER — ALPRAZOLAM 1 MG PO TABS: 1.0000 mg | ORAL_TABLET | Freq: Three times a day (TID) | ORAL | Status: DC | PRN

## 2011-12-08 MED ORDER — OXYCODONE-ACETAMINOPHEN 5-325 MG PO TABS: 1.0000 | ORAL_TABLET | Freq: Four times a day (QID) | ORAL | Status: DC | PRN

## 2011-12-08 MED ORDER — FLUOXETINE HCL 10 MG PO TABS: 10.0000 mg | ORAL_TABLET | Freq: Every day | ORAL | Status: DC

## 2011-12-08 MED ORDER — BUDESONIDE-FORMOTEROL FUMARATE 160-4.5 MCG/ACT IN AERO: 2.0000 | INHALATION_SPRAY | Freq: Two times a day (BID) | RESPIRATORY_TRACT | Status: DC

## 2011-12-08 NOTE — Patient Instructions (Signed)
For your COPD start the new inhaler symbicort For your mood start the prozac, your xanax has been increased to three times a day Get your labs done- we will call with results and fax a copy to Dr. Evette Georges office F/U in 3 weeks

## 2011-12-08 NOTE — Progress Notes (Signed)
  Subjective:    Patient ID: Mary [name], female    DOB: 08-09-1951, 61 y.o.   MRN: [id number]  HPI  DM- takes CBG twice a day, fasting 120-130, Metformin twice a day  A fib- on pradaxa, CCB abd Beta Blocker, had A fib 2 weeks ago  Anxiety- would like medications increased, continues to deal with the stress of loosing her family members, her brother is very sick and he is the last family she has left, she continues to go to church to fellowship with members, feels depressed and sad a lot, no SI  Chornic pain- pain worse at times, taking meds as prescribed  Hyperlipidemia- pravastatin increased to 80mg  by cardiologist  COPD- wheezing more than usual especially at night, feels SOB, using albuterol more, no change in sputum production, continues to smoke   Review of Systems  GEN- denies fatigue, fever, weight loss,weakness, recent illness HEENT- denies eye drainage, change in vision, nasal discharge, CVS- denies chest pain,+ palpitations RESP- + SOB, +cough,+ wheeze ABD- denies N/V, change in stools, abd pain GU- denies dysuria, hematuria, dribbling, incontinence MSK- + joint pain, muscle aches, injury Neuro- denies headache, dizziness, syncope, seizure activity      Objective:   Physical Exam GEN-Crying when discussing her history and family, no cardiopulomonary distress, alert and oriented Neck- no carotid bruit HEENT- PERRL, EOMI, patial dentures on bottom, edentoulous on top row, MMM CVS- Irregular, Irregular rhythem,  RSP-bilateral wheeze- expiratory, normal WOB, no retractions, good air movement ABD- Soft, NT, ND, NABS, no masses felt EXT- no edema Psych- not anxious appearing, depressed affect, no evidence of SI or hallucinations         Assessment & Plan:

## 2011-12-09 ENCOUNTER — Encounter: Payer: Self-pay | Admitting: Family Medicine

## 2011-12-09 DIAGNOSIS — F329 Major depressive disorder, single episode, unspecified: Secondary | ICD-10-CM | POA: Insufficient documentation

## 2011-12-09 DIAGNOSIS — F32A Depression, unspecified: Secondary | ICD-10-CM | POA: Insufficient documentation

## 2011-12-09 NOTE — Assessment & Plan Note (Signed)
Pt is dealing with a lot family issues and her own health. Will start treatment for depression.

## 2011-12-09 NOTE — Assessment & Plan Note (Signed)
Deteriorated pt also depressed Start prozac Xanax increased to TID

## 2011-12-09 NOTE — Assessment & Plan Note (Signed)
Obtain A1C and routine labs, continue metformin

## 2011-12-09 NOTE — Assessment & Plan Note (Signed)
Weight improving, pt is putting off colonoscopy because she has no one to help her during procedure

## 2011-12-09 NOTE — Assessment & Plan Note (Signed)
Continue pravastatin 

## 2011-12-09 NOTE — Assessment & Plan Note (Signed)
No change to meds.

## 2011-12-09 NOTE — Assessment & Plan Note (Signed)
Followed by cardiology, rate controlled

## 2011-12-09 NOTE — Assessment & Plan Note (Signed)
Start symbicort

## 2011-12-11 LAB — BASIC METABOLIC PANEL
BUN: 7 mg/dL (ref 6–23)
Calcium: 9.8 mg/dL (ref 8.4–10.5)
Glucose, Bld: 126 mg/dL — ABNORMAL HIGH (ref 70–99)

## 2011-12-11 LAB — CBC
HCT: 38.9 % (ref 36.0–46.0)
Hemoglobin: 12.7 g/dL (ref 12.0–15.0)
MCH: 31.3 pg (ref 26.0–34.0)
MCV: 95.8 fL (ref 78.0–100.0)
RBC: 4.06 MIL/uL (ref 3.87–5.11)

## 2011-12-11 LAB — LIPID PANEL
Cholesterol: 115 mg/dL (ref 0–200)
Triglycerides: 51 mg/dL (ref <150)

## 2011-12-11 LAB — HEMOGLOBIN A1C: Hgb A1c MFr Bld: 6.9 % — ABNORMAL HIGH (ref <5.7)

## 2011-12-29 ENCOUNTER — Encounter: Payer: Self-pay | Admitting: Family Medicine

## 2011-12-29 ENCOUNTER — Ambulatory Visit (INDEPENDENT_AMBULATORY_CARE_PROVIDER_SITE_OTHER): Payer: Medicare Other | Admitting: Family Medicine

## 2011-12-29 VITALS — BP 104/74 | HR 78 | Resp 16 | Ht 67.0 in | Wt 131.4 lb

## 2011-12-29 DIAGNOSIS — F32A Depression, unspecified: Secondary | ICD-10-CM

## 2011-12-29 DIAGNOSIS — F419 Anxiety disorder, unspecified: Secondary | ICD-10-CM

## 2011-12-29 DIAGNOSIS — F411 Generalized anxiety disorder: Secondary | ICD-10-CM

## 2011-12-29 DIAGNOSIS — F329 Major depressive disorder, single episode, unspecified: Secondary | ICD-10-CM

## 2011-12-29 DIAGNOSIS — F3289 Other specified depressive episodes: Secondary | ICD-10-CM

## 2011-12-29 MED ORDER — OXYCODONE-ACETAMINOPHEN 5-325 MG PO TABS: 1.0000 | ORAL_TABLET | Freq: Four times a day (QID) | ORAL | Status: DC | PRN

## 2011-12-29 MED ORDER — FLUOXETINE HCL 20 MG PO TABS: 10.0000 mg | ORAL_TABLET | Freq: Every day | ORAL | Status: DC

## 2011-12-29 NOTE — Patient Instructions (Signed)
Increase your prozac to 20mg  a day, take 2 of the tablets until you run out then start the new prescription Pain medication refill given today Continue the xanax F/U 3 months

## 2011-12-30 ENCOUNTER — Telehealth: Payer: Self-pay | Admitting: Family Medicine

## 2011-12-30 ENCOUNTER — Other Ambulatory Visit: Payer: Self-pay

## 2011-12-30 MED ORDER — FUROSEMIDE 20 MG PO TABS: 20.0000 mg | ORAL_TABLET | ORAL | Status: DC | PRN

## 2011-12-30 NOTE — Telephone Encounter (Signed)
Med refilled.

## 2011-12-31 ENCOUNTER — Encounter: Payer: Self-pay | Admitting: Family Medicine

## 2011-12-31 NOTE — Progress Notes (Signed)
  Subjective:    Patient ID: Mary [name], female    DOB: 1951-02-13, 61 y.o.   MRN: [id number]  HPI   Patient presents to followup anxiety and depression. Her last visit she had increased feelings of sadness and felt very overwhelmed with her family situation. Her brother is very ill and she has lost 2 other family members within the past year including her daughter. She was started on Prozac low-dose however does not feel any different. She is taking her Xanax at the increased dose of 1 mg 3 times a day which is helping.She continues to rely on her church members for support as well as prayer. She has a lot of stress from her brothers wife as she calls a lot and is often helpless because of his many medical problems. Her brother and 1 other sister are the only family she has left and this makes her very fearful she will have no one left for support, family gatherings etc.   Review of Systems - per above      Objective:   Physical Exam GEN- NAD, alert and oriented x 3, pleasant Psych- normal affect, not anxious appearing, no depressed affect today, no apparent SI       Assessment & Plan:

## 2011-12-31 NOTE — Assessment & Plan Note (Signed)
Continue xanax at increased dose. Pt tolerating medication well.

## 2011-12-31 NOTE — Assessment & Plan Note (Signed)
>   15 minutes spent on counseling Increase prozac to 20mg , I think she is doing well overall in the setting of many deaths so close to her. She continues to get up daily and perform her activities and she is striving to keep her health in order

## 2012-02-06 ENCOUNTER — Other Ambulatory Visit: Payer: Self-pay | Admitting: Family Medicine

## 2012-02-09 ENCOUNTER — Telehealth: Payer: Self-pay | Admitting: Family Medicine

## 2012-02-09 MED ORDER — OXYCODONE-ACETAMINOPHEN 5-325 MG PO TABS: 1.0000 | ORAL_TABLET | Freq: Four times a day (QID) | ORAL | Status: DC | PRN

## 2012-02-09 NOTE — Telephone Encounter (Signed)
Noted and will print

## 2012-03-11 ENCOUNTER — Telehealth: Payer: Self-pay | Admitting: Family Medicine

## 2012-03-14 ENCOUNTER — Other Ambulatory Visit: Payer: Self-pay

## 2012-03-14 MED ORDER — OXYCODONE-ACETAMINOPHEN 5-325 MG PO TABS: 1.0000 | ORAL_TABLET | Freq: Four times a day (QID) | ORAL | Status: DC | PRN

## 2012-03-15 ENCOUNTER — Other Ambulatory Visit: Payer: Self-pay

## 2012-03-15 MED ORDER — ALPRAZOLAM 1 MG PO TABS: 1.0000 mg | ORAL_TABLET | Freq: Three times a day (TID) | ORAL | Status: DC | PRN

## 2012-03-15 NOTE — Telephone Encounter (Signed)
Printed and available

## 2012-03-30 ENCOUNTER — Other Ambulatory Visit: Payer: Self-pay

## 2012-03-30 ENCOUNTER — Telehealth: Payer: Self-pay | Admitting: Family Medicine

## 2012-03-30 MED ORDER — FLUOXETINE HCL 20 MG PO TABS: 10.0000 mg | ORAL_TABLET | Freq: Every day | ORAL | Status: DC

## 2012-03-30 NOTE — Telephone Encounter (Signed)
Med refilled.

## 2012-04-12 ENCOUNTER — Telehealth: Payer: Self-pay | Admitting: Family Medicine

## 2012-04-12 NOTE — Telephone Encounter (Signed)
Noted  

## 2012-04-14 ENCOUNTER — Other Ambulatory Visit: Payer: Self-pay | Admitting: Family Medicine

## 2012-04-14 ENCOUNTER — Ambulatory Visit (INDEPENDENT_AMBULATORY_CARE_PROVIDER_SITE_OTHER): Payer: Medicare Other | Admitting: Family Medicine

## 2012-04-14 ENCOUNTER — Encounter: Payer: Self-pay | Admitting: Family Medicine

## 2012-04-14 VITALS — BP 130/82 | HR 76 | Resp 18 | Ht 67.0 in | Wt 135.0 lb

## 2012-04-14 DIAGNOSIS — Z720 Tobacco use: Secondary | ICD-10-CM

## 2012-04-14 DIAGNOSIS — L821 Other seborrheic keratosis: Secondary | ICD-10-CM | POA: Insufficient documentation

## 2012-04-14 DIAGNOSIS — G8929 Other chronic pain: Secondary | ICD-10-CM

## 2012-04-14 DIAGNOSIS — F32A Depression, unspecified: Secondary | ICD-10-CM

## 2012-04-14 DIAGNOSIS — Z79899 Other long term (current) drug therapy: Secondary | ICD-10-CM

## 2012-04-14 DIAGNOSIS — F3289 Other specified depressive episodes: Secondary | ICD-10-CM

## 2012-04-14 DIAGNOSIS — J449 Chronic obstructive pulmonary disease, unspecified: Secondary | ICD-10-CM

## 2012-04-14 DIAGNOSIS — F17200 Nicotine dependence, unspecified, uncomplicated: Secondary | ICD-10-CM

## 2012-04-14 DIAGNOSIS — F329 Major depressive disorder, single episode, unspecified: Secondary | ICD-10-CM

## 2012-04-14 DIAGNOSIS — I4891 Unspecified atrial fibrillation: Secondary | ICD-10-CM

## 2012-04-14 DIAGNOSIS — E785 Hyperlipidemia, unspecified: Secondary | ICD-10-CM

## 2012-04-14 DIAGNOSIS — D229 Melanocytic nevi, unspecified: Secondary | ICD-10-CM

## 2012-04-14 DIAGNOSIS — E119 Type 2 diabetes mellitus without complications: Secondary | ICD-10-CM

## 2012-04-14 DIAGNOSIS — D239 Other benign neoplasm of skin, unspecified: Secondary | ICD-10-CM

## 2012-04-14 DIAGNOSIS — IMO0002 Reserved for concepts with insufficient information to code with codable children: Secondary | ICD-10-CM

## 2012-04-14 LAB — CBC
MCH: 29.5 pg (ref 26.0–34.0)
MCV: 92.8 fL (ref 78.0–100.0)
Platelets: 196 10*3/uL (ref 150–400)
RDW: 15.3 % (ref 11.5–15.5)
WBC: 8.8 10*3/uL (ref 4.0–10.5)

## 2012-04-14 LAB — COMPREHENSIVE METABOLIC PANEL
ALT: 8 U/L (ref 0–35)
CO2: 29 mEq/L (ref 19–32)
Chloride: 100 mEq/L (ref 96–112)
Potassium: 4.8 mEq/L (ref 3.5–5.3)
Sodium: 139 mEq/L (ref 135–145)
Total Bilirubin: 0.3 mg/dL (ref 0.3–1.2)
Total Protein: 7.1 g/dL (ref 6.0–8.3)

## 2012-04-14 MED ORDER — OXYCODONE-ACETAMINOPHEN 5-325 MG PO TABS: 1.0000 | ORAL_TABLET | Freq: Four times a day (QID) | ORAL | Status: DC | PRN

## 2012-04-14 NOTE — Assessment & Plan Note (Signed)
Currently stable.

## 2012-04-14 NOTE — Assessment & Plan Note (Signed)
I think the "liver spots" that she is worried about are  keratoses

## 2012-04-14 NOTE — Assessment & Plan Note (Signed)
She's been doing well with good rate control. She is followup with cardiology next month continue pradaxa

## 2012-04-14 NOTE — Assessment & Plan Note (Addendum)
Last A1c less than 7. Will repeat today continue Glucophage Allergies to discuss started on low-dose ACE inhibitor for renal protection after following up her labs

## 2012-04-14 NOTE — Assessment & Plan Note (Addendum)
Urine drug screen sent,  pain medications refill

## 2012-04-14 NOTE — Assessment & Plan Note (Signed)
Check direct LDL. Her last LDL was 61 which was at goal

## 2012-04-14 NOTE — Assessment & Plan Note (Signed)
I think that she should stay on the SSRI in the setting of her brother's recent passing. She has been doing well on both Prozac and Xanax. I'll reassess her in another 3 months and see she can start to taper off of the Prozac

## 2012-04-14 NOTE — Assessment & Plan Note (Signed)
She has a few atypical moles. She's had some removed greater than 20 years ago. They're to on her lower left back and I think need to be removed. Will send her to dermatology to be evaluated

## 2012-04-14 NOTE — Progress Notes (Signed)
  Subjective:    Patient ID: Mary [name], female    DOB: February 08, 1951, 61 y.o.   MRN: [id number]  HPI Patient here to followup chronic medical problems. Medications reviewed. She is followup with her cardiologist next month. She's been doing well overall. She's concerned about some spots on her leg which she thinks her liver spots.    Review of Systems   GEN- denies fatigue, fever, weight loss,weakness, recent illness HEENT- denies eye drainage, change in vision, nasal discharge, CVS- denies chest pain, palpitations RESP- denies SOB, cough, wheeze ABD- denies N/V, change in stools, abd pain GU- denies dysuria, hematuria, dribbling, incontinence MSK-+joint pain, muscle aches, injury Neuro- denies headache, dizziness, syncope, seizure activity      Objective:   Physical Exam GEN- NAD alert and oriented x3  HEENT- PERRL, EOMI, patial dentures on bottom, edentoulous on top row, MMM CVS- Irregular, Irregular rhythem, no murmur RSP-few expiratory wheze, normal WOB, no retractions, good air movement ABD- Soft, NT, ND, NABS, no masses felt EXT- no edema Psych- not anxious appearing, depressed affect, no evidence of SI or hallucinations Skin- multiple nevi, seborrhoic keratotis, left lower back 2 atypical moles with clearing on borders, increased pigementation       Assessment & Plan:

## 2012-04-14 NOTE — Patient Instructions (Signed)
Get the labs done we will call with results Continue current medications Referral to dermatology will be done for the moles F/U 3 months Cancel July appt

## 2012-04-15 ENCOUNTER — Other Ambulatory Visit: Payer: Self-pay

## 2012-04-15 LAB — LIPID PANEL
HDL: 43 mg/dL (ref >39)
Total CHOL/HDL Ratio: 2.9 Ratio
Triglycerides: 121 mg/dL (ref <150)

## 2012-04-15 LAB — HEMOGLOBIN A1C
Hgb A1c MFr Bld: 7.4 % — ABNORMAL HIGH (ref <5.7)
Mean Plasma Glucose: 160 mg/dL — ABNORMAL HIGH (ref <117)

## 2012-04-15 MED ORDER — GLUCOSE BLOOD VI STRP: ORAL_STRIP | Status: DC

## 2012-04-15 MED ORDER — ACCU-CHEK MULTICLIX LANCETS MISC: Status: DC

## 2012-04-15 NOTE — Addendum Note (Signed)
Addended by: Denman George B on: 04/15/2012 10:15 AM   Modules accepted: Orders

## 2012-04-16 LAB — DRUG SCREEN, URINE
Barbiturate Quant, Ur: NEGATIVE
Cocaine Metabolites: NEGATIVE
Creatinine,U: 107.39 mg/dL
Marijuana Metabolite: NEGATIVE
Opiates: POSITIVE — AB
Phencyclidine (PCP): NEGATIVE

## 2012-04-22 ENCOUNTER — Other Ambulatory Visit: Payer: Self-pay

## 2012-04-22 MED ORDER — ONETOUCH ULTRASOFT LANCETS MISC: Status: DC

## 2012-04-26 ENCOUNTER — Ambulatory Visit: Payer: Medicare Other | Admitting: Family Medicine

## 2012-05-02 ENCOUNTER — Other Ambulatory Visit: Payer: Self-pay

## 2012-05-02 MED ORDER — METFORMIN HCL 500 MG PO TABS: ORAL_TABLET | ORAL | Status: DC

## 2012-05-09 ENCOUNTER — Other Ambulatory Visit: Payer: Self-pay

## 2012-05-09 ENCOUNTER — Telehealth: Payer: Self-pay | Admitting: Family Medicine

## 2012-05-09 MED ORDER — OXYCODONE-ACETAMINOPHEN 5-325 MG PO TABS: 1.0000 | ORAL_TABLET | Freq: Four times a day (QID) | ORAL | Status: DC | PRN

## 2012-05-09 MED ORDER — ALPRAZOLAM 1 MG PO TABS
1.0000 mg | ORAL_TABLET | Freq: Three times a day (TID) | ORAL | Status: DC | PRN
Start: 2012-05-09 — End: 2012-06-08

## 2012-05-09 NOTE — Telephone Encounter (Signed)
Script ready for pick up. Called and left message for pt.

## 2012-06-06 ENCOUNTER — Telehealth: Payer: Self-pay | Admitting: Family Medicine

## 2012-06-06 NOTE — Telephone Encounter (Signed)
message

## 2012-06-07 ENCOUNTER — Telehealth: Payer: Self-pay | Admitting: Family Medicine

## 2012-06-08 ENCOUNTER — Other Ambulatory Visit: Payer: Self-pay

## 2012-06-08 MED ORDER — ALPRAZOLAM 1 MG PO TABS: 1.0000 mg | ORAL_TABLET | Freq: Three times a day (TID) | ORAL | Status: DC | PRN

## 2012-06-08 MED ORDER — OXYCODONE-ACETAMINOPHEN 5-325 MG PO TABS: 1.0000 | ORAL_TABLET | Freq: Four times a day (QID) | ORAL | Status: DC | PRN

## 2012-06-08 NOTE — Telephone Encounter (Signed)
Scripts printed and pt can collect on 8/15

## 2012-06-28 ENCOUNTER — Other Ambulatory Visit: Payer: Self-pay | Admitting: Family Medicine

## 2012-07-01 ENCOUNTER — Other Ambulatory Visit: Payer: Self-pay

## 2012-07-01 MED ORDER — ALPRAZOLAM 1 MG PO TABS: 1.0000 mg | ORAL_TABLET | Freq: Three times a day (TID) | ORAL | Status: DC | PRN

## 2012-07-01 MED ORDER — OXYCODONE-ACETAMINOPHEN 5-325 MG PO TABS: 1.0000 | ORAL_TABLET | Freq: Four times a day (QID) | ORAL | Status: DC | PRN

## 2012-07-05 ENCOUNTER — Telehealth: Payer: Self-pay | Admitting: Family Medicine

## 2012-07-05 NOTE — Telephone Encounter (Signed)
Patient aware that rx is ready.

## 2012-07-15 ENCOUNTER — Encounter: Payer: Self-pay | Admitting: Family Medicine

## 2012-07-15 ENCOUNTER — Ambulatory Visit (INDEPENDENT_AMBULATORY_CARE_PROVIDER_SITE_OTHER): Payer: Medicare Other | Admitting: Family Medicine

## 2012-07-15 VITALS — BP 118/80 | HR 73 | Resp 16 | Ht 67.0 in | Wt 142.1 lb

## 2012-07-15 DIAGNOSIS — F411 Generalized anxiety disorder: Secondary | ICD-10-CM

## 2012-07-15 DIAGNOSIS — F17200 Nicotine dependence, unspecified, uncomplicated: Secondary | ICD-10-CM

## 2012-07-15 DIAGNOSIS — R0981 Nasal congestion: Secondary | ICD-10-CM

## 2012-07-15 DIAGNOSIS — J449 Chronic obstructive pulmonary disease, unspecified: Secondary | ICD-10-CM

## 2012-07-15 DIAGNOSIS — E119 Type 2 diabetes mellitus without complications: Secondary | ICD-10-CM

## 2012-07-15 DIAGNOSIS — G8929 Other chronic pain: Secondary | ICD-10-CM

## 2012-07-15 DIAGNOSIS — Z23 Encounter for immunization: Secondary | ICD-10-CM

## 2012-07-15 DIAGNOSIS — F419 Anxiety disorder, unspecified: Secondary | ICD-10-CM

## 2012-07-15 DIAGNOSIS — E785 Hyperlipidemia, unspecified: Secondary | ICD-10-CM

## 2012-07-15 DIAGNOSIS — J3489 Other specified disorders of nose and nasal sinuses: Secondary | ICD-10-CM

## 2012-07-15 DIAGNOSIS — Z720 Tobacco use: Secondary | ICD-10-CM

## 2012-07-15 MED ORDER — ALPRAZOLAM 1 MG PO TABS: 1.0000 mg | ORAL_TABLET | Freq: Three times a day (TID) | ORAL | Status: DC | PRN

## 2012-07-15 MED ORDER — OXYCODONE-ACETAMINOPHEN 5-325 MG PO TABS: 1.0000 | ORAL_TABLET | Freq: Four times a day (QID) | ORAL | Status: DC | PRN

## 2012-07-15 NOTE — Patient Instructions (Addendum)
Continue current medications A1C will be needed before next visit Flu shot given Use nasal saline for nasal stuffiness, try humidifier if air dry Oct pain and nerve medications given today, not to be filled until Oct 5th  F/U 4 months

## 2012-07-16 LAB — BASIC METABOLIC PANEL
BUN: 6 mg/dL (ref 6–23)
CO2: 29 mEq/L (ref 19–32)
Chloride: 100 mEq/L (ref 96–112)
Creat: 0.57 mg/dL (ref 0.50–1.10)
Glucose, Bld: 103 mg/dL — ABNORMAL HIGH (ref 70–99)

## 2012-07-17 ENCOUNTER — Encounter: Payer: Self-pay | Admitting: Family Medicine

## 2012-07-17 DIAGNOSIS — R0981 Nasal congestion: Secondary | ICD-10-CM | POA: Insufficient documentation

## 2012-07-17 MED ORDER — METFORMIN HCL 1000 MG PO TABS: ORAL_TABLET | ORAL | Status: DC

## 2012-07-17 NOTE — Assessment & Plan Note (Signed)
Currently stable, she continues to smoke

## 2012-07-17 NOTE — Assessment & Plan Note (Signed)
Unchanged, continue to encourage cessation

## 2012-07-17 NOTE — Assessment & Plan Note (Signed)
Cholesterol at goal, LDL 66

## 2012-07-17 NOTE — Progress Notes (Signed)
  Subjective:    Patient ID: Mary [name], female    DOB: 1951-04-14, 61 y.o.   MRN: [id number]  HPI  PT here to f/u chronic medical problems, she has had some nasal stuffiness and feels fullness in right ear, no fever, no drainag, no cough. Needs flu shot today No recent chest pain. Breathing has been good DM- blood sugars have been good, no readings with her today.  Review of Systems  GEN- denies fatigue, fever, weight loss,weakness, recent illness HEENT- denies eye drainage, change in vision, nasal discharge, CVS- denies chest pain, palpitations RESP- denies SOB, cough, wheeze ABD- denies N/V, change in stools, abd pain GU- denies dysuria, hematuria, dribbling, incontinence MSK- + joint pain, muscle aches, injury Neuro- denies headache, dizziness, syncope, seizure activity      Objective:   Physical Exam GEN- NAD alert and oriented x3  HEENT- PERRL, EOMI, oropharynx clear, MMM, nares clear, TM clear bilat, no effusion, no sinus tenderness Neck- supple, no LAD CVS- Irregular, Irregular rhythm, no murmur RSP-CTAB no wheeze EXT- no edema Psych- not anxious appearing, depressed affect, no evidence of SI or hallucinations       Assessment & Plan:

## 2012-07-17 NOTE — Assessment & Plan Note (Signed)
Stable on meds, no change

## 2012-07-17 NOTE — Assessment & Plan Note (Signed)
Pt has been compliant, meds refilled

## 2012-07-17 NOTE — Assessment & Plan Note (Signed)
A1C shows deterioration, will plan to increase metformin to 1000mg  BID as tolerated

## 2012-07-17 NOTE — Assessment & Plan Note (Signed)
Supportive care, humidier, nasal saline

## 2012-08-22 ENCOUNTER — Telehealth: Payer: Self-pay | Admitting: Family Medicine

## 2012-08-22 MED ORDER — OXYCODONE-ACETAMINOPHEN 5-325 MG PO TABS: 1.0000 | ORAL_TABLET | Freq: Four times a day (QID) | ORAL | Status: DC | PRN

## 2012-08-22 MED ORDER — ALPRAZOLAM 1 MG PO TABS: 1.0000 mg | ORAL_TABLET | Freq: Three times a day (TID) | ORAL | Status: DC | PRN

## 2012-08-22 NOTE — Telephone Encounter (Signed)
Printed for Dr to sign

## 2012-08-22 NOTE — Telephone Encounter (Signed)
Pt aware.

## 2012-08-26 ENCOUNTER — Ambulatory Visit (INDEPENDENT_AMBULATORY_CARE_PROVIDER_SITE_OTHER): Payer: Medicare Other | Admitting: Family Medicine

## 2012-08-26 ENCOUNTER — Encounter: Payer: Self-pay | Admitting: Family Medicine

## 2012-08-26 VITALS — BP 110/82 | HR 87 | Resp 15 | Ht 67.0 in | Wt 139.0 lb

## 2012-08-26 DIAGNOSIS — M5137 Other intervertebral disc degeneration, lumbosacral region: Secondary | ICD-10-CM

## 2012-08-26 DIAGNOSIS — M5136 Other intervertebral disc degeneration, lumbar region: Secondary | ICD-10-CM

## 2012-08-26 DIAGNOSIS — M51369 Other intervertebral disc degeneration, lumbar region without mention of lumbar back pain or lower extremity pain: Secondary | ICD-10-CM

## 2012-08-26 DIAGNOSIS — E119 Type 2 diabetes mellitus without complications: Secondary | ICD-10-CM

## 2012-08-26 DIAGNOSIS — M51379 Other intervertebral disc degeneration, lumbosacral region without mention of lumbar back pain or lower extremity pain: Secondary | ICD-10-CM

## 2012-08-26 DIAGNOSIS — J449 Chronic obstructive pulmonary disease, unspecified: Secondary | ICD-10-CM

## 2012-08-26 DIAGNOSIS — K529 Noninfective gastroenteritis and colitis, unspecified: Secondary | ICD-10-CM

## 2012-08-26 DIAGNOSIS — G8929 Other chronic pain: Secondary | ICD-10-CM

## 2012-08-26 DIAGNOSIS — J4489 Other specified chronic obstructive pulmonary disease: Secondary | ICD-10-CM

## 2012-08-26 DIAGNOSIS — K5289 Other specified noninfective gastroenteritis and colitis: Secondary | ICD-10-CM

## 2012-08-26 MED ORDER — OXYCODONE-ACETAMINOPHEN 7.5-325 MG PO TABS: 1.0000 | ORAL_TABLET | Freq: Four times a day (QID) | ORAL | Status: DC | PRN

## 2012-08-26 MED ORDER — PROMETHAZINE HCL 12.5 MG PO TABS: 12.5000 mg | ORAL_TABLET | Freq: Four times a day (QID) | ORAL | Status: DC | PRN

## 2012-08-26 MED ORDER — PROMETHAZINE HCL 25 MG/ML IJ SOLN: 25.0000 mg | Freq: Once | INTRAMUSCULAR | Status: DC

## 2012-08-26 MED ORDER — PROMETHAZINE HCL 25 MG/ML IJ SOLN
25.0000 mg | Freq: Once | INTRAMUSCULAR | Status: AC
  Administered 2012-08-26: 25 mg via INTRAMUSCULAR

## 2012-08-26 NOTE — Progress Notes (Signed)
  Subjective:    Patient ID: Mary [name], female    DOB: 06-28-1951, 61 y.o.   MRN: [id number]  HPI Patient presents with nausea with emesis times one this morning nonbloody/ nonbilious. She's also had some mild diarrhea for the past week. In the setting of this she denies fever but has had increased body aches and worsening back pain. Her pain is worse on the left side with radiation to the left hip and down the left leg. She's had episodes like this before. She's currently on Percocet 5 325 states has not helped. She's also used Voltaren gel with no improvement.    Review of Systems  GEN- + fatigue, fever, weight loss,weakness, recent illness HEENT- denies eye drainage, change in vision, nasal discharge, CVS- denies chest pain, palpitations RESP- denies SOB, cough, wheeze ABD-+ N/V,+ change in stools, abd pain GU- denies dysuria, hematuria, dribbling, incontinence MSK- + joint pain, muscle aches, injury Neuro- denies headache, dizziness, syncope, seizure activity      Objective:   Physical Exam GEN- NAD alert and oriented x3 , fatigued appearing, walks with cane HEENT- PERRL, EOMI, oropharynx clear, MMM, nares clear, TM clear bilat, no effusion,  Neck- supple, no LAD CVS- Irregular, Irregular rhythm, no murmur RSP-few bilateral wheeze, no rhonchi, normal WOB ABD-NABS,soft, NT,ND Back- TTP lumbar and into left buttocks, decreased ROM ,+ SLR left side  EXT- no edema       Assessment & Plan:

## 2012-08-26 NOTE — Assessment & Plan Note (Signed)
Metformin was increased to 1000 mg twice a day recently. Her blood sugars were doing well before her illness started

## 2012-08-26 NOTE — Assessment & Plan Note (Signed)
Chronic back pain. She is a nonsurgical candidate. I will increase her pain medications of Percocet 7.5 mg. I believe her pain is worse in the setting of her viral illness

## 2012-08-26 NOTE — Assessment & Plan Note (Signed)
Mild viral gastroenteritis. Will give her Phenergan here in the office for nausea. She is tolerating fluids and small meals. We'll also start her on Phenergan tablets as needed. Exam benign

## 2012-08-26 NOTE — Assessment & Plan Note (Signed)
She's currently symptom-free however her exam shows some wheezing. Continue current medications advised her to use Symbicort twice a day

## 2012-08-26 NOTE — Patient Instructions (Addendum)
Nausea medication given New higher dose of pain medication Call if you do not improve Keep up with fluids to avoid dehydration  Viral Gastroenteritis Viral gastroenteritis is also called stomach flu. This illness is caused by a certain type of germ (virus). It can cause sudden watery poop (diarrhea) and throwing up (vomiting). This can cause you to lose body fluids (dehydration). This illness usually lasts for 3 to 8 days. It usually goes away on its own. HOME CARE    Drink enough fluids to keep your pee (urine) clear or pale yellow. Drink small amounts of fluids often.   Ask your doctor how to replace body fluid losses (rehydration).   Avoid:   Foods high in sugar.   Alcohol.   Bubbly (carbonated) drinks.   Tobacco.   Juice.   Caffeine drinks.   Very hot or cold fluids.   Fatty, greasy foods.   Eating too much at one time.   Dairy products until 24 to 48 hours after your watery poop stops.   You may eat foods with active cultures (probiotics). They can be found in some yogurts and supplements.   Wash your hands well to avoid spreading the illness.   Only take medicines as told by your doctor. Do not give aspirin to children. Do not take medicines for watery poop (antidiarrheals).   Ask your doctor if you should keep taking your regular medicines.   Keep all doctor visits as told.  GET HELP RIGHT AWAY IF:    You cannot keep fluids down.   You do not pee at least once every 6 to 8 hours.   You are short of breath.   You see blood in your poop or throw up. This may look like coffee grounds.   You have belly (abdominal) pain that gets worse or is just in one small spot (localized).   You keep throwing up or having watery poop.   You have a fever.   The patient is a child younger than 3 months, and he or she has a fever.   The patient is a child older than 3 months, and he or she has a fever and problems that do not go away.   The patient is a child older  than 3 months, and he or she has a fever and problems that suddenly get worse.   The patient is a baby, and he or she has no tears when crying.  MAKE SURE YOU:    Understand these instructions.   Will watch your condition.   Will get help right away if you are not doing well or get worse.  Document Released: 03/30/2008 Document Revised: 01/04/2012 Document Reviewed: 07/29/2011 [facility] Patient Information 2013 [place].

## 2012-08-26 NOTE — Assessment & Plan Note (Signed)
Per above increase Percocet

## 2012-08-26 NOTE — Addendum Note (Signed)
Addended by: Eual Fines on: 08/26/2012 02:04 PM   Modules accepted: Orders

## 2012-08-29 ENCOUNTER — Ambulatory Visit: Payer: Medicare Other | Admitting: Family Medicine

## 2012-09-10 ENCOUNTER — Encounter (HOSPITAL_COMMUNITY): Payer: Self-pay

## 2012-09-10 ENCOUNTER — Inpatient Hospital Stay (HOSPITAL_COMMUNITY): Payer: Medicare Other

## 2012-09-10 ENCOUNTER — Emergency Department (HOSPITAL_COMMUNITY): Payer: Medicare Other

## 2012-09-10 ENCOUNTER — Inpatient Hospital Stay (HOSPITAL_COMMUNITY)
Admission: EM | Admit: 2012-09-10 | Discharge: 2012-09-13 | DRG: 291 | Disposition: A | Discharge status: Home / Self Care | Payer: Medicare Other | Attending: Pulmonary Disease | Admitting: Pulmonary Disease

## 2012-09-10 DIAGNOSIS — Z720 Tobacco use: Secondary | ICD-10-CM

## 2012-09-10 DIAGNOSIS — L821 Other seborrheic keratosis: Secondary | ICD-10-CM

## 2012-09-10 DIAGNOSIS — J811 Chronic pulmonary edema: Secondary | ICD-10-CM

## 2012-09-10 DIAGNOSIS — Z79899 Other long term (current) drug therapy: Secondary | ICD-10-CM

## 2012-09-10 DIAGNOSIS — E876 Hypokalemia: Secondary | ICD-10-CM | POA: Diagnosis present

## 2012-09-10 DIAGNOSIS — E785 Hyperlipidemia, unspecified: Secondary | ICD-10-CM

## 2012-09-10 DIAGNOSIS — I5030 Unspecified diastolic (congestive) heart failure: Secondary | ICD-10-CM

## 2012-09-10 DIAGNOSIS — R0981 Nasal congestion: Secondary | ICD-10-CM

## 2012-09-10 DIAGNOSIS — K529 Noninfective gastroenteritis and colitis, unspecified: Secondary | ICD-10-CM

## 2012-09-10 DIAGNOSIS — R634 Abnormal weight loss: Secondary | ICD-10-CM

## 2012-09-10 DIAGNOSIS — I10 Essential (primary) hypertension: Secondary | ICD-10-CM | POA: Diagnosis present

## 2012-09-10 DIAGNOSIS — F411 Generalized anxiety disorder: Secondary | ICD-10-CM | POA: Diagnosis present

## 2012-09-10 DIAGNOSIS — I4891 Unspecified atrial fibrillation: Secondary | ICD-10-CM | POA: Diagnosis present

## 2012-09-10 DIAGNOSIS — R0609 Other forms of dyspnea: Secondary | ICD-10-CM

## 2012-09-10 DIAGNOSIS — G8929 Other chronic pain: Secondary | ICD-10-CM

## 2012-09-10 DIAGNOSIS — I5033 Acute on chronic diastolic (congestive) heart failure: Principal | ICD-10-CM | POA: Diagnosis present

## 2012-09-10 DIAGNOSIS — F419 Anxiety disorder, unspecified: Secondary | ICD-10-CM

## 2012-09-10 DIAGNOSIS — R0902 Hypoxemia: Secondary | ICD-10-CM

## 2012-09-10 DIAGNOSIS — J9600 Acute respiratory failure, unspecified whether with hypoxia or hypercapnia: Secondary | ICD-10-CM

## 2012-09-10 DIAGNOSIS — J45901 Unspecified asthma with (acute) exacerbation: Secondary | ICD-10-CM | POA: Diagnosis present

## 2012-09-10 DIAGNOSIS — D229 Melanocytic nevi, unspecified: Secondary | ICD-10-CM

## 2012-09-10 DIAGNOSIS — J441 Chronic obstructive pulmonary disease with (acute) exacerbation: Secondary | ICD-10-CM | POA: Diagnosis present

## 2012-09-10 DIAGNOSIS — F17200 Nicotine dependence, unspecified, uncomplicated: Secondary | ICD-10-CM | POA: Diagnosis present

## 2012-09-10 DIAGNOSIS — F329 Major depressive disorder, single episode, unspecified: Secondary | ICD-10-CM

## 2012-09-10 DIAGNOSIS — I509 Heart failure, unspecified: Secondary | ICD-10-CM | POA: Diagnosis present

## 2012-09-10 DIAGNOSIS — J449 Chronic obstructive pulmonary disease, unspecified: Secondary | ICD-10-CM

## 2012-09-10 DIAGNOSIS — D649 Anemia, unspecified: Secondary | ICD-10-CM | POA: Diagnosis present

## 2012-09-10 DIAGNOSIS — E119 Type 2 diabetes mellitus without complications: Secondary | ICD-10-CM | POA: Diagnosis present

## 2012-09-10 DIAGNOSIS — R0603 Acute respiratory distress: Secondary | ICD-10-CM

## 2012-09-10 DIAGNOSIS — M5136 Other intervertebral disc degeneration, lumbar region: Secondary | ICD-10-CM

## 2012-09-10 HISTORY — DX: Heart failure, unspecified: I50.9

## 2012-09-10 HISTORY — DX: Shortness of breath: R06.02

## 2012-09-10 LAB — CBC WITH DIFFERENTIAL/PLATELET
Basophils Absolute: 0 10*3/uL (ref 0.0–0.1)
Eosinophils Relative: 1 % (ref 0–5)
Lymphocytes Relative: 16 % (ref 12–46)
Neutro Abs: 6.8 10*3/uL (ref 1.7–7.7)
Platelets: 209 10*3/uL (ref 150–400)
RDW: 15.2 % (ref 11.5–15.5)
WBC: 8.9 10*3/uL (ref 4.0–10.5)

## 2012-09-10 LAB — BLOOD GAS, ARTERIAL
Acid-Base Excess: 4.8 mmol/L — ABNORMAL HIGH (ref 0.0–2.0)
Delivery systems: POSITIVE
Expiratory PAP: 6
FIO2: 40 %
O2 Content: 4 L/min
O2 Saturation: 95.4 %
Patient temperature: 37
Patient temperature: 37
pH, Arterial: 7.41 (ref 7.350–7.450)

## 2012-09-10 LAB — COMPREHENSIVE METABOLIC PANEL
ALT: 6 U/L (ref 0–35)
AST: 10 U/L (ref 0–37)
Albumin: 3.7 g/dL (ref 3.5–5.2)
CO2: 30 mEq/L (ref 19–32)
Calcium: 9.7 mg/dL (ref 8.4–10.5)
Chloride: 100 mEq/L (ref 96–112)
GFR calc non Af Amer: >90 mL/min (ref >90)
Sodium: 138 mEq/L (ref 135–145)

## 2012-09-10 LAB — URINALYSIS, ROUTINE W REFLEX MICROSCOPIC
Bilirubin Urine: NEGATIVE
Leukocytes, UA: NEGATIVE
Nitrite: NEGATIVE
Specific Gravity, Urine: 1.01 (ref 1.005–1.030)
pH: 7 (ref 5.0–8.0)

## 2012-09-10 LAB — TROPONIN I: Troponin I: <0.3 ng/mL (ref <0.30)

## 2012-09-10 LAB — LACTIC ACID, PLASMA: Lactic Acid, Venous: 1.6 mmol/L (ref 0.5–2.2)

## 2012-09-10 LAB — PRO B NATRIURETIC PEPTIDE: Pro B Natriuretic peptide (BNP): 4239 pg/mL — ABNORMAL HIGH (ref 0–125)

## 2012-09-10 MED ORDER — DABIGATRAN ETEXILATE MESYLATE 150 MG PO CAPS
150.0000 mg | ORAL_CAPSULE | Freq: Two times a day (BID) | ORAL | Status: DC
Start: 2012-09-10 — End: 2012-09-13
  Administered 2012-09-10 – 2012-09-13 (×6): 150 mg via ORAL
  Filled 2012-09-10 (×7): qty 1

## 2012-09-10 MED ORDER — METHYLPREDNISOLONE SODIUM SUCC 125 MG IJ SOLR
125.0000 mg | Freq: Once | INTRAMUSCULAR | Status: AC
  Administered 2012-09-10: 125 mg via INTRAVENOUS
  Filled 2012-09-10: qty 2

## 2012-09-10 MED ORDER — IPRATROPIUM BROMIDE 0.02 % IN SOLN
0.5000 mg | Freq: Once | RESPIRATORY_TRACT | Status: AC
  Administered 2012-09-10: 0.5 mg via RESPIRATORY_TRACT
  Filled 2012-09-10: qty 2.5

## 2012-09-10 MED ORDER — DILTIAZEM HCL 60 MG PO TABS
60.0000 mg | ORAL_TABLET | Freq: Three times a day (TID) | ORAL | Status: DC
  Administered 2012-09-10 – 2012-09-11 (×2): 60 mg via ORAL
  Filled 2012-09-10 (×5): qty 1

## 2012-09-10 MED ORDER — FUROSEMIDE 10 MG/ML IJ SOLN
40.0000 mg | Freq: Three times a day (TID) | INTRAMUSCULAR | Status: AC
  Administered 2012-09-10 – 2012-09-11 (×2): 40 mg via INTRAVENOUS
  Filled 2012-09-10 (×2): qty 4

## 2012-09-10 MED ORDER — SODIUM CHLORIDE 0.9 % IV BOLUS (SEPSIS)
250.0000 mL | Freq: Once | INTRAVENOUS | Status: AC
  Administered 2012-09-10: 10:00:00 via INTRAVENOUS

## 2012-09-10 MED ORDER — LORAZEPAM 2 MG/ML IJ SOLN
INTRAMUSCULAR | Status: AC
  Administered 2012-09-10: 0.5 mg
  Filled 2012-09-10: qty 1

## 2012-09-10 MED ORDER — ALPRAZOLAM 0.5 MG PO TABS
0.5000 mg | ORAL_TABLET | Freq: Three times a day (TID) | ORAL | Status: DC | PRN
  Administered 2012-09-10 – 2012-09-13 (×5): 0.5 mg via ORAL
  Filled 2012-09-10 (×5): qty 1

## 2012-09-10 MED ORDER — ALBUTEROL SULFATE (5 MG/ML) 0.5% IN NEBU
5.0000 mg | INHALATION_SOLUTION | Freq: Once | RESPIRATORY_TRACT | Status: AC
  Administered 2012-09-10: 5 mg via RESPIRATORY_TRACT
  Filled 2012-09-10: qty 1

## 2012-09-10 MED ORDER — DILTIAZEM HCL 25 MG/5ML IV SOLN
10.0000 mg | Freq: Once | INTRAVENOUS | Status: AC
  Administered 2012-09-10: 10 mg via INTRAVENOUS

## 2012-09-10 MED ORDER — PANTOPRAZOLE SODIUM 40 MG IV SOLR
40.0000 mg | INTRAVENOUS | Status: DC
  Administered 2012-09-10: 40 mg via INTRAVENOUS
  Filled 2012-09-10 (×2): qty 40

## 2012-09-10 MED ORDER — ACETAMINOPHEN 325 MG PO TABS
650.0000 mg | ORAL_TABLET | Freq: Four times a day (QID) | ORAL | Status: DC | PRN
  Administered 2012-09-10 – 2012-09-11 (×2): 650 mg via ORAL
  Filled 2012-09-10 (×2): qty 2

## 2012-09-10 MED ORDER — ALBUTEROL SULFATE (5 MG/ML) 0.5% IN NEBU
2.5000 mg | INHALATION_SOLUTION | RESPIRATORY_TRACT | Status: DC
  Administered 2012-09-10 – 2012-09-11 (×4): 2.5 mg via RESPIRATORY_TRACT
  Filled 2012-09-10 (×4): qty 0.5

## 2012-09-10 MED ORDER — SODIUM CHLORIDE 0.9 % IV SOLN
INTRAVENOUS | Status: DC
  Administered 2012-09-10: 23:00:00 via INTRAVENOUS
  Administered 2012-09-11: 100 mL/h via INTRAVENOUS

## 2012-09-10 MED ORDER — DILTIAZEM HCL 25 MG/5ML IV SOLN
10.0000 mg | Freq: Once | INTRAVENOUS | Status: AC
  Administered 2012-09-10: 10 mg via INTRAVENOUS
  Filled 2012-09-10: qty 5

## 2012-09-10 MED ORDER — DILTIAZEM HCL 100 MG IV SOLR
5.0000 mg/h | Freq: Once | INTRAVENOUS | Status: AC
  Administered 2012-09-10: 10 mg/h via INTRAVENOUS
  Filled 2012-09-10: qty 100

## 2012-09-10 MED ORDER — SODIUM CHLORIDE 0.9 % IV SOLN: INTRAVENOUS | Status: DC

## 2012-09-10 MED ORDER — POTASSIUM CHLORIDE CRYS ER 20 MEQ PO TBCR
40.0000 meq | EXTENDED_RELEASE_TABLET | Freq: Once | ORAL | Status: AC
  Administered 2012-09-10: 40 meq via ORAL
  Filled 2012-09-10: qty 2

## 2012-09-10 MED ORDER — FUROSEMIDE 10 MG/ML IJ SOLN
80.0000 mg | Freq: Once | INTRAMUSCULAR | Status: AC
  Administered 2012-09-10: 80 mg via INTRAVENOUS
  Filled 2012-09-10: qty 8

## 2012-09-10 MED ORDER — DEXTROSE 5 % IV SOLN
5.0000 mg/h | INTRAVENOUS | Status: DC
  Administered 2012-09-10: 15 mg/h via INTRAVENOUS
  Administered 2012-09-11: 10 mg/h via INTRAVENOUS
  Administered 2012-09-11: 15 mg/h via INTRAVENOUS
  Filled 2012-09-10 (×5): qty 100

## 2012-09-10 MED ORDER — MAGNESIUM SULFATE 40 MG/ML IJ SOLN
2.0000 g | Freq: Once | INTRAMUSCULAR | Status: AC
  Administered 2012-09-10: 2 g via INTRAVENOUS
  Filled 2012-09-10: qty 50

## 2012-09-10 MED ORDER — IPRATROPIUM BROMIDE 0.02 % IN SOLN
0.5000 mg | RESPIRATORY_TRACT | Status: DC
  Administered 2012-09-10 – 2012-09-11 (×4): 0.5 mg via RESPIRATORY_TRACT
  Filled 2012-09-10 (×4): qty 2.5

## 2012-09-10 MED ORDER — ALBUTEROL SULFATE (5 MG/ML) 0.5% IN NEBU: 2.5000 mg | INHALATION_SOLUTION | RESPIRATORY_TRACT | Status: DC | PRN

## 2012-09-10 MED ORDER — BUDESONIDE-FORMOTEROL FUMARATE 160-4.5 MCG/ACT IN AERO
2.0000 | INHALATION_SPRAY | Freq: Two times a day (BID) | RESPIRATORY_TRACT | Status: DC
  Administered 2012-09-10 – 2012-09-13 (×6): 2 via RESPIRATORY_TRACT
  Filled 2012-09-10: qty 6

## 2012-09-10 NOTE — ED Notes (Signed)
[facility] here to get PT.

## 2012-09-10 NOTE — Progress Notes (Signed)
PULMONARY  / CRITICAL CARE MEDICINE  Name: Mary [name] MRN: [id number] DOB: 09-28-1951    LOS: 0  REFERRING MD :  EDP at [facility]   CHIEF COMPLAINT:  Resp Distress   BRIEF PATIENT DESCRIPTION:  61 yo female presented to [facility] ER w/ resp distress found to have COPD exacerbation complicated by A-Fib RVR , requiring BIPAP and Cardizem drip .  Transferred to [facility] w/ PCCM admission to ICU 11/16   LINES / TUBES:   CULTURES:   ANTIBIOTICS:   SIGNIFICANT EVENTS:  11/16 >tranx to [facility] from [facility]   LEVEL OF CARE:  ICU  PRIMARY SERVICE:  PCCM  CONSULTANTS:   CODE STATUS: Full  DIET:  NPO  DVT Px:  HEP  GI Px:  PPI   HISTORY OF PRESENT ILLNESS:   The patient presented with the shortness of breath via  EMS to [facility] ED  Upon arrival to the ED she was in atrial fib with a heart rate around 133. Atrial fib was addressed with Cardizem 10 mg IV push heart rate came down to around 100. Patient at home had oxygen saturation on room air 86%. Started on 4 L and when she arrived here she was in or above 90%. Patient felt better from her breathing standpoint. After the Cardizem as her heart rate got better. Patient slowly started to get more short of breath she was given albuterol Atrovent and she started with some wheezing she progressively got worse patient was reevaluated chest x-ray repeated lungs sounded wet chest x-ray confirms mild congestive heart failure. Her heart rate was back up to the 130s. Patient's oxygen saturation on 4 L of oxygen was dropping down into the 81% range.  At this point patient started on BiPAP IV Lasix 80 mg given, repeat Cardizem at 10 mL grams IV push and started on a Cardizem drip to control heart rate. Patient's initial blood gas showed a pH of 7.35 PCO2 of 55. That was when the patient was feeling fine and not having any raw breathing problems on the oxygen.  At this point in time patient was ready for admission, after getting the Lasix the BiPAP and repeat Cardizem heart rate still  around upper 120s however patient's breathing improved significantly. Repeat arterial blood gas showed improvement in the pH is 7.4 and improvement in the PCO2 to 47.  Pt was transferred to [facility] for ICU for further evaluation/Tx / w/ PCCM admission     PAST MEDICAL HISTORY :  Past Medical History  Diagnosis Date  . Diabetes mellitus   . Arthritis   . Anxiety   . Chronic pain     Bacl pain, Disc L5-S1- Dr. Joya Salm in [place]  . Hyperlipidemia   . Hypertension   . Tachycardia   . Bronchial asthma   . Atrial fibrillation   . COPD (chronic obstructive pulmonary disease)   . DDD (degenerative disc disease)     Radicular symptoms   Past Surgical History  Procedure Date  . Carpal tunnel release     x 2  . Breast cyst removed   . Abdominal hysterectomy     partial  . Tubal ligation   . Tonsillectomy    Prior to Admission medications   Medication Sig Start Date End Date Taking? Authorizing Provider  albuterol (PROVENTIL HFA;VENTOLIN HFA) 108 (90 BASE) MCG/ACT inhaler Inhale 2 puffs into the lungs every 4 (four) hours as needed. Shortness of Breath 06/26/11  Yes Alycia Rossetti, MD  ALPRAZolam Duanne Moron)  1 MG tablet Take 1 tablet (1 mg total) by mouth 3 (three) times daily as needed. 08/22/12  Yes Alycia Rossetti, MD  aspirin 81 MG tablet Take 81 mg by mouth daily.     Yes Historical Provider, MD  budesonide-formoterol (SYMBICORT) 160-4.5 MCG/ACT inhaler Inhale 2 puffs into the lungs 2 (two) times daily. 12/08/11 12/07/12 Yes Alycia Rossetti, MD  calcium gluconate 500 MG tablet Take 500 mg by mouth 2 (two) times daily.     Yes Historical Provider, MD  dabigatran (PRADAXA) 150 MG CAPS Take 150 mg by mouth 2 (two) times daily.     Yes Historical Provider, MD  diltiazem (TIAZAC) 240 MG 24 hr capsule Take 240 mg by mouth daily.   Yes Historical Provider, MD  FLUoxetine (PROZAC) 10 MG capsule Take 10 mg by mouth daily.   Yes Historical Provider, MD  furosemide (LASIX) 20 MG tablet Take 20 mg by  mouth as needed. Swelling 12/30/11  Yes Alycia Rossetti, MD  Liniments [facility] PAIN RELIEF PATCH) PADS Apply 1 each topically daily as needed. Pain   Yes Historical Provider, MD  metFORMIN (GLUCOPHAGE) 500 MG tablet Take 500 mg by mouth 2 (two) times daily.   Yes Historical Provider, MD  metoprolol tartrate (LOPRESSOR) 25 MG tablet Take 25-50 mg by mouth 2 (two) times daily. Takes 50mg  in the morning and 25mg  in the evening.   Yes Historical Provider, MD  Multiple Vitamin (MULTIVITAMIN WITH MINERALS) TABS Take 1 tablet by mouth daily.   Yes Historical Provider, MD  oxyCODONE-acetaminophen (PERCOCET/ROXICET) 5-325 MG per tablet Take 1 tablet by mouth every 4 (four) hours as needed. Pain   Yes Historical Provider, MD  pravastatin (PRAVACHOL) 80 MG tablet Take 80 mg by mouth at bedtime.     Yes Historical Provider, MD  promethazine (PHENERGAN) 12.5 MG tablet Take 1 tablet (12.5 mg total) by mouth every 6 (six) hours as needed for nausea. 08/26/12  Yes Alycia Rossetti, MD  sodium chloride (OCEAN) 0.65 % nasal spray Place 1 spray into the nose as needed. Congestion   Yes Historical Provider, MD   Allergies  Allergen Reactions  . Adhesive (Tape) Other (See Comments)    Tears skin off.   . Latex Other (See Comments)    In tape, tears skin off.  . Zocor (Simvastatin - High Dose) Nausea And Vomiting    FAMILY HISTORY:  Family History  Problem Relation Age of Onset  . Diabetes Mother   . Heart disease Mother   . Diabetes Sister   . Hypertension Sister   . Hyperlipidemia Sister   . Diabetes Brother   . Heart disease Brother   . Diabetes Brother   . Heart disease Brother    SOCIAL HISTORY:  reports that she has been smoking.  She does not have any smokeless tobacco history on file. She reports that she does not drink alcohol or use illicit drugs.  REVIEW OF SYSTEMS:   Unable to obtain , as she is on BIPAP    INTERVAL HISTORY:   VITAL SIGNS: Temp:  [97.6 F (36.4 C)] 97.6 F (36.4 C)  (11/16 0852) Pulse Rate:  [94-145] 115  (11/16 1629) Resp:  [18-32] 22  (11/16 1629) BP: (108-161)/(84-110) 140/87 mmHg (11/16 1629) SpO2:  [86 %-98 %] 95 % (11/16 1629) FiO2 (%):  [40 %] 40 % (11/16 1629) Weight:  [136 lb (61.689 kg)] 136 lb (61.689 kg) (11/16 0848) HEMODYNAMICS:   VENTILATOR SETTINGS: Vent Mode:  [-]  FiO2 (%):  [40 %] 40 % INTAKE / OUTPUT: Intake/Output      11/15 0701 - 11/16 0700 11/16 0701 - 11/17 0700   Urine (mL/kg/hr)  2250 (3.4)   Total Output  2250   Net  -2250          PHYSICAL EXAMINATION: General:  Alert on BIPAP  Neuro:  A/ox 3 , follows commands  HEENT:  BIPAP  Cardiovascular: Irreg  Lungs:  Decreased BS , bibasilar crackles  Abdomen:  Obese , soft /nt  Musculoskeletal:  Intact  Skin:  Intact    LABS:  Lab 09/10/12 1445 09/10/12 1322 09/10/12 1249 09/10/12 1131 09/10/12 0943  HGB -- -- -- -- 11.7*  WBC -- -- -- -- 8.9  PLT -- -- -- -- 209  NA -- -- -- -- 138  K -- -- -- -- 4.4  CL -- -- -- -- 100  CO2 -- -- -- -- 30  GLUCOSE -- -- -- -- 107*  BUN -- -- -- -- 5*  CREATININE -- -- -- -- 0.39*  CALCIUM -- -- -- -- 9.7  MG -- -- -- -- --  PHOS -- -- -- -- --  AST -- -- -- -- 10  ALT -- -- -- -- 6  ALKPHOS -- -- -- -- 61  BILITOT -- -- -- -- 0.5  PROT -- -- -- -- 7.0  ALBUMIN -- -- -- -- 3.7  APTT -- -- -- -- --  INR -- -- -- -- 1.38  LATICACIDVEN -- -- 1.6 -- --  TROPONINI -- <0.30 -- -- <0.30  PROCALCITON -- -- -- -- --  PROBNP -- -- -- -- 4239.0*  O2SATVEN -- -- -- -- --  PHART 7.410 -- -- 7.352 --  PCO2ART 47.2* -- -- 55.7* --  PO2ART 70.5* -- -- 78.0* --    Lab 09/10/12 1627 09/10/12 1151  GLUCAP 185* 116*    IMAGING: Cxr Appearance of the chest suggests mild congestive heart failure   ECG: atrial fib   DIAGNOSES: Active Problems:  * No active hospital problems. *    ASSESSMENT / PLAN:  PULMONARY  ASSESSMENT : Acute Resp failure w/ COPD exacerbation and volume overload   PLAN:   BIPAP support prn    Check ABG  Pulmonary hygiene w/ nebs  Hold on abx for now , watch fever /wbc       CARDIOVASCULAR  ASSESSMENT: Atrial Fib w/ RVR  Enzymes neg   PLAN:  Cont  cardizem IV  Cont on Pradaxa  Lasix x 2   RENAL  ASSESSMENT:  Monitor for electrolyte imbalances  PLAN:   Monitor bmet   GASTROINTESTINAL  ASSESSMENT:   PLAN:   PPI   HEMATOLOGIC  ASSESSMENT:  No active issues  PLAN:  Monitor cbc   INFECTIOUS  ASSESSMENT:  No obvious infectious source for now  PLAN:   Monitor wbc /fever curve Hold abx for now   ENDOCRINE  ASSESSMENT:  Hx of DM  PLAN:   SSI  Hold oral agents for now   NEUROLOGIC   ASSESSMENT:  Anxiety  PLAN:   Xanax As needed  (decreased dose)    CLINICAL SUMMARY:  I have personally obtained a history, examined the patient, evaluated laboratory and imaging results, formulated the assessment and plan and placed orders.  09/10/2012, 5:52 PM  Will admit to the ICU for observation overnight, start PO cardizem to attempt and get off the drip.  Will start anti-HTN as needed  depending on stabilization of rhythem, no need for abx or steroids, this is all cardiac, diureses, K replacement and AM labs.  Echo and BNP ordered and pending.  Patient seen and examined, agree with above note.  I dictated the care and orders written for this patient under my direction.  Jennet Maduro, M.D. [phone]

## 2012-09-10 NOTE — ED Notes (Signed)
Pt called out and stated, " Im having trouble breathing" Sats 88% on 4L at this time. Pt received 1 breathing tx. resp 30 and short. Dr. Rogene Houston made aware.

## 2012-09-10 NOTE — ED Notes (Signed)
Pt breathing still labored and sats ranging 83-84%. Dr. Rogene Houston in to assess pt. Resp now here for bi-pap placement.

## 2012-09-10 NOTE — ED Notes (Signed)
Patients CBG was 116

## 2012-09-10 NOTE — ED Notes (Signed)
EMS reports pt c/o sob x 2 days.  Pt also c/o feeling like something is stuck in throat.  Reprots tried to swallow a pill this morning but vomited.  Pt also c/o "soreness in chest"   Since yesterday.  EMS reports pt was afib with HR  Up to 146.  Also reports expiratory wheeze.  EMS arrived to find pt's o2 on room air was 86% and with 4 liters 02 increased to 100%.  CBG 176

## 2012-09-10 NOTE — ED Notes (Signed)
Dr.zackowski to see pt.

## 2012-09-10 NOTE — Progress Notes (Signed)
Pt shob and increased HR with on and off bed pan. HR into the 140s pt desats to 90. Placed foley cath

## 2012-09-10 NOTE — ED Notes (Signed)
Pt resting in bed, heart rate decreased into 90's

## 2012-09-10 NOTE — ED Provider Notes (Addendum)
History   This chart was scribed for Mary Kung, MD, by Farrel Conners scribe. The patient was seen in room APA14/APA14 and the patient's care was started at [place].    CSN: [id number]  Arrival date & time 09/10/12  [id number]   First MD Initiated Contact with Patient 09/10/12 [phone]      Chief Complaint  Patient presents with  . Shortness of Breath    (Consider location/radiation/quality/duration/timing/severity/associated sxs/prior treatment) HPI Comments: Mary [name] is a 61 y.o. female brought in by ambulance, who presents to the Emergency Department complaining of gradually worsening, moderate SOB that began yesterday afternoon. She reports associated tachycardia that began at 7 am. She reports an associated headache, productive cough, and 5 episodes of diarrhea yesterday and 1 today. Her sister reports that her legs started swelling a few months ago. Her sister reports that she has associated intermittent chest pain that began about a week ago. She denies any associated fevers, vomiting, or dysuria. She has a h/o of asthma, but no h/o of MI. She states that takes aspirin, diltiazem, and albuterol, but she does not use oxygen at home.   PCP is Dr. Buelah Manis. Cardiologist is Dr. Georgina Peer.   Past Medical History  Diagnosis Date  . Diabetes mellitus   . Arthritis   . Anxiety   . Chronic pain     Bacl pain, Disc L5-S1- Dr. Joya Salm in [place]  . Hyperlipidemia   . Hypertension   . Tachycardia   . Bronchial asthma   . Atrial fibrillation   . COPD (chronic obstructive pulmonary disease)   . DDD (degenerative disc disease)     Radicular symptoms    Past Surgical History  Procedure Date  . Carpal tunnel release     x 2  . Breast cyst removed   . Abdominal hysterectomy     partial  . Tubal ligation   . Tonsillectomy     Family History  Problem Relation Age of Onset  . Diabetes Mother   . Heart disease Mother   . Diabetes Sister   . Hypertension Sister   . Hyperlipidemia  Sister   . Diabetes Brother   . Heart disease Brother   . Diabetes Brother   . Heart disease Brother     History  Substance Use Topics  . Smoking status: Current Every Day Smoker -- 0.5 packs/day  . Smokeless tobacco: Not on file  . Alcohol Use: No    OB History    Grav Para Term Preterm Abortions TAB SAB Ect Mult Living                  Review of Systems  Constitutional: Negative for fever.  Respiratory: Positive for shortness of breath.   Cardiovascular: Positive for chest pain.  Gastrointestinal: Positive for diarrhea. Negative for vomiting.  Genitourinary: Negative for dysuria.  All other systems reviewed and are negative.    Allergies  Adhesive; Latex; and Zocor  Home Medications   Current Outpatient Rx  Name  Route  Sig  Dispense  Refill  . ALBUTEROL SULFATE HFA 108 (90 BASE) MCG/ACT IN AERS   Inhalation   Inhale 2 puffs into the lungs every 4 (four) hours as needed. Shortness of Breath         . ALPRAZOLAM 1 MG PO TABS   Oral   Take 1 tablet (1 mg total) by mouth 3 (three) times daily as needed.   90 tablet   0   . ASPIRIN 81  MG PO TABS   Oral   Take 81 mg by mouth daily.           . BUDESONIDE-FORMOTEROL FUMARATE 160-4.5 MCG/ACT IN AERO   Inhalation   Inhale 2 puffs into the lungs 2 (two) times daily.   1 Inhaler   6   . CALCIUM GLUCONATE 500 MG PO TABS   Oral   Take 500 mg by mouth 2 (two) times daily.           Marland Kitchen DABIGATRAN ETEXILATE MESYLATE 150 MG PO CAPS   Oral   Take 150 mg by mouth 2 (two) times daily.           Marland Kitchen DILTIAZEM HCL ER BEADS 240 MG PO CP24   Oral   Take 240 mg by mouth daily.         Marland Kitchen FLUOXETINE HCL 10 MG PO CAPS   Oral   Take 10 mg by mouth daily.         . FUROSEMIDE 20 MG PO TABS   Oral   Take 20 mg by mouth as needed. Swelling         . SALONPAS PAIN RELIEF PATCH EX PADS   Apply externally   Apply 1 each topically daily as needed. Pain         . METFORMIN HCL 500 MG PO TABS   Oral   Take  500 mg by mouth 2 (two) times daily.         Marland Kitchen METOPROLOL TARTRATE 25 MG PO TABS   Oral   Take 25-50 mg by mouth 2 (two) times daily. Takes 50mg  in the morning and 25mg  in the evening.         . ADULT MULTIVITAMIN W/MINERALS CH   Oral   Take 1 tablet by mouth daily.         . OXYCODONE-ACETAMINOPHEN 5-325 MG PO TABS   Oral   Take 1 tablet by mouth every 4 (four) hours as needed. Pain         . PRAVASTATIN SODIUM 80 MG PO TABS   Oral   Take 80 mg by mouth at bedtime.           Marland Kitchen PROMETHAZINE HCL 12.5 MG PO TABS   Oral   Take 1 tablet (12.5 mg total) by mouth every 6 (six) hours as needed for nausea.   30 tablet   0   . SALINE NASAL SPRAY 0.65 % NA SOLN   Nasal   Place 1 spray into the nose as needed. Congestion           BP 154/99  Pulse 123  Temp 97.6 F (36.4 C) (Oral)  Resp 28  Ht 5\' 7"  (1.702 m)  Wt 136 lb (61.689 kg)  BMI 21.30 kg/m2  SpO2 94%  Physical Exam  Constitutional: She is oriented to person, place, and time. She appears well-developed and well-nourished.  HENT:  Mouth/Throat: Mucous membranes are dry.  Cardiovascular: Regular rhythm.  Tachycardia present.   Pulmonary/Chest: She has no wheezes. She has rhonchi.       She has bilateral rhonchi. She is using some accessory muscles when she is breathing.  Musculoskeletal:       She has trace swelling to her ankles.  Lymphadenopathy:    She has no cervical adenopathy.  Neurological: She is alert and oriented to person, place, and time. No cranial nerve deficit. She exhibits normal muscle tone. Coordination normal.  Psychiatric: She has a  normal mood and affect. Her behavior is normal.    ED Course  Procedures (including critical care time)  DIAGNOSTIC STUDIES: Oxygen Saturation is 98% on 4 L of O2 at 100%, normal by my interpretation.    COORDINATION OF CARE:  09:30- Discussed planned course of treatment with the patient, including who is agreeable at this time.  09:45- Medication  Orders- sodium chloride 0.9% bolus 250 mL- Once, diltiazem (CARDIZEM) injection 10 mg- Once.  11:05- Recheck- Her oxygen levels are still the same. Her heart rate is down to 104.  12:41- Recheck- She is still complaining of pain when she breathes. Her heart rate is back up to upper 120s / low 130s. She had some wheezing and some rales present. Her O2 saturation levels are 88% on O2.  Results for orders placed during the hospital encounter of 09/10/12  CBC WITH DIFFERENTIAL      Component Value Range   WBC 8.9  4.0 - 10.5 K/uL   RBC 3.90  3.87 - 5.11 MIL/uL   Hemoglobin 11.7 (*) 12.0 - 15.0 g/dL   HCT 36.2  36.0 - 46.0 %   MCV 92.8  78.0 - 100.0 fL   MCH 30.0  26.0 - 34.0 pg   MCHC 32.3  30.0 - 36.0 g/dL   RDW 15.2  11.5 - 15.5 %   Platelets 209  150 - 400 K/uL   Neutrophils Relative 77  43 - 77 %   Neutro Abs 6.8  1.7 - 7.7 K/uL   Lymphocytes Relative 16  12 - 46 %   Lymphs Abs 1.5  0.7 - 4.0 K/uL   Monocytes Relative 6  3 - 12 %   Monocytes Absolute 0.5  0.1 - 1.0 K/uL   Eosinophils Relative 1  0 - 5 %   Eosinophils Absolute 0.1  0.0 - 0.7 K/uL   Basophils Relative 0  0 - 1 %   Basophils Absolute 0.0  0.0 - 0.1 K/uL  COMPREHENSIVE METABOLIC PANEL      Component Value Range   Sodium 138  135 - 145 mEq/L   Potassium 4.4  3.5 - 5.1 mEq/L   Chloride 100  96 - 112 mEq/L   CO2 30  19 - 32 mEq/L   Glucose, Bld 107 (*) 70 - 99 mg/dL   BUN 5 (*) 6 - 23 mg/dL   Creatinine, Ser 0.39 (*) 0.50 - 1.10 mg/dL   Calcium 9.7  8.4 - 10.5 mg/dL   Total Protein 7.0  6.0 - 8.3 g/dL   Albumin 3.7  3.5 - 5.2 g/dL   AST 10  0 - 37 U/L   ALT 6  0 - 35 U/L   Alkaline Phosphatase 61  39 - 117 U/L   Total Bilirubin 0.5  0.3 - 1.2 mg/dL   GFR calc non Af Amer >90  >90 mL/min   GFR calc Af Amer >90  >90 mL/min  PROTIME-INR      Component Value Range   Prothrombin Time 16.6 (*) 11.6 - 15.2 seconds   INR 1.38  0.00 - 1.49  PRO B NATRIURETIC PEPTIDE      Component Value Range   Pro B Natriuretic  peptide (BNP) 4239.0 (*) 0 - 125 pg/mL  URINALYSIS, ROUTINE W REFLEX MICROSCOPIC      Component Value Range   Color, Urine YELLOW  YELLOW   APPearance CLEAR  CLEAR   Specific Gravity, Urine 1.010  1.005 - 1.030   pH 7.0  5.0 - 8.0   Glucose, UA NEGATIVE  NEGATIVE mg/dL   Hgb urine dipstick NEGATIVE  NEGATIVE   Bilirubin Urine NEGATIVE  NEGATIVE   Ketones, ur NEGATIVE  NEGATIVE mg/dL   Protein, ur NEGATIVE  NEGATIVE mg/dL   Urobilinogen, UA 0.2  0.0 - 1.0 mg/dL   Nitrite NEGATIVE  NEGATIVE   Leukocytes, UA NEGATIVE  NEGATIVE  TROPONIN I      Component Value Range   Troponin I <0.30  <0.30 ng/mL  BLOOD GAS, ARTERIAL      Component Value Range   O2 Content 4.0     Delivery systems NASAL CANNULA     pH, Arterial 7.352  7.350 - 7.450   pCO2 arterial 55.7 (*) 35.0 - 45.0 mmHg   pO2, Arterial 78.0 (*) 80.0 - 100.0 mmHg   Bicarbonate 30.1 (*) 20.0 - 24.0 mEq/L   TCO2 27.8  0 - 100 mmol/L   Acid-Base Excess 4.8 (*) 0.0 - 2.0 mmol/L   O2 Saturation 95.4     Patient temperature 37.0     Collection site RIGHT BRACHIAL     Drawn by COLLECTED BY RT     Sample type ARTERIAL     Allens test (pass/fail) NOT INDICATED (*) PASS  GLUCOSE, CAPILLARY      Component Value Range   Glucose-Capillary 116 (*) 70 - 99 mg/dL   Labs Reviewed  CBC WITH DIFFERENTIAL - Abnormal; Notable for the following:    Hemoglobin 11.7 (*)     All other components within normal limits  COMPREHENSIVE METABOLIC PANEL - Abnormal; Notable for the following:    Glucose, Bld 107 (*)     BUN 5 (*)     Creatinine, Ser 0.39 (*)     All other components within normal limits  PROTIME-INR - Abnormal; Notable for the following:    Prothrombin Time 16.6 (*)     All other components within normal limits  PRO B NATRIURETIC PEPTIDE - Abnormal; Notable for the following:    Pro B Natriuretic peptide (BNP) 4239.0 (*)     All other components within normal limits  BLOOD GAS, ARTERIAL - Abnormal; Notable for the following:     pCO2 arterial 55.7 (*)     pO2, Arterial 78.0 (*)     Bicarbonate 30.1 (*)     Acid-Base Excess 4.8 (*)     Allens test (pass/fail) NOT INDICATED (*)     All other components within normal limits  GLUCOSE, CAPILLARY - Abnormal; Notable for the following:    Glucose-Capillary 116 (*)     All other components within normal limits  URINALYSIS, ROUTINE W REFLEX MICROSCOPIC  TROPONIN I  CULTURE, BLOOD (ROUTINE X 2)  CULTURE, BLOOD (ROUTINE X 2)  LACTIC ACID, PLASMA  TROPONIN I   Dg Chest Port 1 View  09/10/2012  *RADIOLOGY REPORT*  Clinical Data: Shortness of breath  PORTABLE CHEST - 1 VIEW  Comparison: 03/11/2011  Findings: Chronic interstitial markings.  Mild patchy right upper lobe opacities, atelectasis versus pneumonia.  Bibasilar opacities, likely atelectasis.  No pleural effusion or pneumothorax.  The heart is top normal in size.  IMPRESSION: Mild patchy right upper lobe opacities, atelectasis versus pneumonia.   Original Report Authenticated By: Julian Hy, M.D.    Dg Chest Port 1v Same Day  09/10/2012  *RADIOLOGY REPORT*  Clinical Data: Decreased O2 sats.  PORTABLE CHEST - 1 VIEW SAME DAY  Comparison: Chest x-ray 09/10/2012.  Findings: There is cephalization of the pulmonary vasculature  and slight indistinctness of the interstitial markings suggestive of mild pulmonary edema. Probable trace left pleural effusion.  Heart size is borderline to mildly enlarged.  Atherosclerosis in the thoracic aorta.  IMPRESSION: 1.  Appearance of the chest suggests mild congestive heart failure. 2.  Atherosclerosis.   Original Report Authenticated By: Vinnie Langton, M.D.     Date: 09/10/2012  Rate: 127  Rhythm: atrial fibrillation  QRS Axis: normal  Intervals: normal  ST/T Wave abnormalities: nonspecific ST/T changes  Conduction Disutrbances:none  Narrative Interpretation:   Old EKG Reviewed: none available    1. Respiratory distress   2. Atrial fibrillation with rapid ventricular  response     CRITICAL CARE Performed by: Mary [name].   Total critical care time: 90  Critical care time was exclusive of separately billable procedures and treating other patients.  Critical care was necessary to treat or prevent imminent or life-threatening deterioration.  Critical care was time spent personally by me on the following activities: development of treatment plan with patient and/or surrogate as well as nursing, discussions with consultants, evaluation of patient's response to treatment, examination of patient, obtaining history from patient or surrogate, ordering and performing treatments and interventions, ordering and review of laboratory studies, ordering and review of radiographic studies, pulse oximetry and re-evaluation of patient's condition.    MDM   The patient presented with the shortness of breath the EMS. Upon arrival to the ED she was in atrial fib with a heart rate around 133. Atrial fib was addressed with Cardizem 10 mg IV push heart rate came down to around 100. Patient at home had oxygen saturation on room air 86%. Started on 4 L and when she arrived here she was in or above 90%. Patient felt better from her breathing standpoint. After the Cardizem as her heart rate got better. Patient slowly started to get more short of breath she was given albuterol Atrovent and she started with some wheezing she progressively got worse patient was reevaluated chest x-ray repeated lungs sounded wet chest x-ray confirms mild congestive heart failure. Her heart rate was back up to the 130s. Patient's oxygen saturation on 4 L of oxygen was dropping down into the 81% range.  At this point patient started on BiPAP IV Lasix 80 mg given, repeat Cardizem at 10 mL grams IV push and started on a Cardizem drip to control heart rate. Patient's initial blood gas showed a pH of 7.35 PCO2 of 55. That was when the patient was feeling fine and not having any raw breathing problems on the  oxygen.  At this point in time patient was ready for admission, after getting the Lasix the BiPAP and repeat Cardizem heart rate still around upper 120s however patient's breathing improved significantly. Repeat arterial blood gas showed improvement in the pH is 7.4 and improvement in the PCO2 to 47.  Discussed with hospitalist here felt that she should not be admitted here since she is followed by [facility] and [facility]. Discussed with the hospitalist at [facility] [place] they refuse to patient telling that she needed to be a critical care patient in the ICU. Clinically I disagreed with this statement. Discussed with on-call critical care Dr. Nelda Marseille, who concurred the patient was stable for stepdown and should be admitted by internal medicine hospitalist service. We called the internal medicine hospitalist they refuse to take the patient. [facility] then intervened and spoke with critical care and Dr. Nelda Marseille has agreed is to take  the patient will transfer the patient to the ICU and treat appropriately from that point on. Time spent trying to get the patient transferred accounted for well over 60 minutes a time.  Suspect that patient's problems have been mostly related to the persistent atrial fib she's had shortness of breath and chest discomfort for the past week. No evidence of acute cardiac event. Atrial fib remained poorly controlled and resulted in loss of atrial kick and patient went into congestive heart failure. Repeat troponin drawn at the surgeon and was also negative.   I personally performed the services described in this documentation, which was scribed in my presence. The recorded information has been reviewed and is accurate.          Mary Kung, MD 09/10/12 [place], MD 09/10/12 [phone]

## 2012-09-10 NOTE — ED Notes (Signed)
Dr. Rogene Houston in to assess pt.

## 2012-09-10 NOTE — ED Notes (Signed)
Pt breathing less labored. Pt states, " I feel better"

## 2012-09-10 NOTE — ED Notes (Signed)
[facility] called with bed assignment for Pt.  Rm 2115.  Will send a truck.  Nurse informed.

## 2012-09-10 NOTE — Progress Notes (Signed)
[facility] Progress Note Patient Name: Mary [name] DOB: 24-Jul-1951 MRN: [id number]  Date of Service  09/10/2012   HPI/Events of Note  Hypomagnesemia   eICU Interventions  Mg replaced      ALBON,DANA 09/10/2012, 10:40 PM

## 2012-09-11 ENCOUNTER — Inpatient Hospital Stay (HOSPITAL_COMMUNITY): Payer: Medicare Other

## 2012-09-11 LAB — BASIC METABOLIC PANEL
BUN: 11 mg/dL (ref 6–23)
CO2: 29 mEq/L (ref 19–32)
CO2: 32 mEq/L (ref 19–32)
Calcium: 9.5 mg/dL (ref 8.4–10.5)
Chloride: 94 mEq/L — ABNORMAL LOW (ref 96–112)
Creatinine, Ser: 0.53 mg/dL (ref 0.50–1.10)
GFR calc Af Amer: >90 mL/min (ref >90)
GFR calc non Af Amer: >90 mL/min (ref >90)
Glucose, Bld: 109 mg/dL — ABNORMAL HIGH (ref 70–99)
Sodium: 135 mEq/L (ref 135–145)

## 2012-09-11 LAB — CBC
MCH: 29.4 pg (ref 26.0–34.0)
MCHC: 33.1 g/dL (ref 30.0–36.0)
Platelets: 229 10*3/uL (ref 150–400)
RBC: 4.01 MIL/uL (ref 3.87–5.11)
RDW: 14.7 % (ref 11.5–15.5)

## 2012-09-11 LAB — MAGNESIUM: Magnesium: 1.8 mg/dL (ref 1.5–2.5)

## 2012-09-11 LAB — GLUCOSE, CAPILLARY
Glucose-Capillary: 239 mg/dL — ABNORMAL HIGH (ref 70–99)
Glucose-Capillary: 166 mg/dL — ABNORMAL HIGH (ref 70–99)

## 2012-09-11 LAB — PHOSPHORUS: Phosphorus: 2.6 mg/dL (ref 2.3–4.6)

## 2012-09-11 MED ORDER — INSULIN ASPART 100 UNIT/ML ~~LOC~~ SOLN
0.0000 [IU] | Freq: Three times a day (TID) | SUBCUTANEOUS | Status: DC
  Administered 2012-09-11: 5 [IU] via SUBCUTANEOUS
  Administered 2012-09-11: 2 [IU] via SUBCUTANEOUS
  Administered 2012-09-11: 5 [IU] via SUBCUTANEOUS
  Administered 2012-09-12 (×2): 1 [IU] via SUBCUTANEOUS
  Administered 2012-09-12: 3 [IU] via SUBCUTANEOUS
  Administered 2012-09-13 (×2): 2 [IU] via SUBCUTANEOUS

## 2012-09-11 MED ORDER — ONDANSETRON HCL 4 MG/2ML IJ SOLN
INTRAMUSCULAR | Status: AC
  Administered 2012-09-11: 4 mg
  Filled 2012-09-11: qty 2

## 2012-09-11 MED ORDER — METOPROLOL TARTRATE 1 MG/ML IV SOLN
INTRAVENOUS | Status: AC
  Filled 2012-09-11: qty 5

## 2012-09-11 MED ORDER — METOPROLOL TARTRATE 1 MG/ML IV SOLN
2.5000 mg | Freq: Once | INTRAVENOUS | Status: AC
  Administered 2012-09-11: 2.5 mg via INTRAVENOUS

## 2012-09-11 MED ORDER — SALINE SPRAY 0.65 % NA SOLN
1.0000 | NASAL | Status: DC | PRN
  Filled 2012-09-11: qty 44

## 2012-09-11 MED ORDER — IPRATROPIUM BROMIDE 0.02 % IN SOLN
0.5000 mg | Freq: Four times a day (QID) | RESPIRATORY_TRACT | Status: DC
  Administered 2012-09-11 – 2012-09-13 (×7): 0.5 mg via RESPIRATORY_TRACT
  Filled 2012-09-11 (×8): qty 2.5

## 2012-09-11 MED ORDER — ONDANSETRON HCL 4 MG/2ML IJ SOLN
4.0000 mg | Freq: Four times a day (QID) | INTRAMUSCULAR | Status: DC | PRN
  Administered 2012-09-11: 4 mg via INTRAVENOUS
  Filled 2012-09-11: qty 2

## 2012-09-11 MED ORDER — OXYCODONE-ACETAMINOPHEN 5-325 MG PO TABS
1.0000 | ORAL_TABLET | Freq: Four times a day (QID) | ORAL | Status: DC | PRN
  Administered 2012-09-11: 1 via ORAL
  Filled 2012-09-11: qty 1

## 2012-09-11 MED ORDER — DILTIAZEM HCL 60 MG PO TABS
60.0000 mg | ORAL_TABLET | Freq: Four times a day (QID) | ORAL | Status: AC
  Administered 2012-09-11 – 2012-09-13 (×8): 60 mg via ORAL
  Filled 2012-09-11 (×8): qty 1

## 2012-09-11 MED ORDER — PANTOPRAZOLE SODIUM 40 MG PO TBEC
40.0000 mg | DELAYED_RELEASE_TABLET | Freq: Every day | ORAL | Status: DC
  Administered 2012-09-11 – 2012-09-12 (×2): 40 mg via ORAL
  Filled 2012-09-11 (×2): qty 1

## 2012-09-11 MED ORDER — INSULIN GLARGINE 100 UNIT/ML ~~LOC~~ SOLN
10.0000 [IU] | Freq: Every day | SUBCUTANEOUS | Status: DC
  Administered 2012-09-11 – 2012-09-12 (×2): 10 [IU] via SUBCUTANEOUS

## 2012-09-11 MED ORDER — LEVALBUTEROL HCL 0.63 MG/3ML IN NEBU
0.6300 mg | INHALATION_SOLUTION | Freq: Four times a day (QID) | RESPIRATORY_TRACT | Status: DC
  Administered 2012-09-11 – 2012-09-13 (×7): 0.63 mg via RESPIRATORY_TRACT
  Filled 2012-09-11 (×12): qty 3

## 2012-09-11 MED ORDER — POTASSIUM CHLORIDE CRYS ER 20 MEQ PO TBCR
20.0000 meq | EXTENDED_RELEASE_TABLET | ORAL | Status: AC
Start: 2012-09-11 — End: 2012-09-11
  Administered 2012-09-11 (×2): 20 meq via ORAL
  Filled 2012-09-11 (×2): qty 1

## 2012-09-11 MED ORDER — LEVALBUTEROL HCL 0.63 MG/3ML IN NEBU
0.6300 mg | INHALATION_SOLUTION | RESPIRATORY_TRACT | Status: DC | PRN
  Filled 2012-09-11: qty 3

## 2012-09-11 MED ORDER — METOPROLOL TARTRATE 25 MG PO TABS
25.0000 mg | ORAL_TABLET | Freq: Two times a day (BID) | ORAL | Status: DC
  Administered 2012-09-11 – 2012-09-13 (×5): 25 mg via ORAL
  Filled 2012-09-11 (×6): qty 1

## 2012-09-11 NOTE — Progress Notes (Signed)
[facility] ADULT ICU REPLACEMENT PROTOCOL FOR AM LAB REPLACEMENT ONLY  The patient does apply for the [facility] Adult ICU Electrolyte Replacment Protocol based on the criteria listed below:   1. Is GFR >/= 50 ml/min? yes  Patient's GFR today is 90 2. Is urine output >/= 0.5 ml/kg/hr for the last 8 hours? yes Patient's UOP is 3.8 ml/kg/hr 3. Is BUN < 30 mg/dL? yes  Patient's BUN today is 10 4. Abnormal electrolyte(s): K 3.5 5. Ordered repletion with: protocol 6. If a panic level lab has been reported, has the CCM MD in charge been notified? no.   Physician:  Dr Orvan July, Eddie Dibbles [facility] 09/11/2012 6:14 AM

## 2012-09-11 NOTE — Progress Notes (Signed)
  Echocardiogram 2D Echocardiogram has been performed.  Kasey Ewings FRANCES 09/11/2012, 12:21 PM

## 2012-09-11 NOTE — Progress Notes (Signed)
Patient states she's not feeling well, states she usually takes metformin 2 x a day. No metformin noted in orders FSBS noted to be 295, states she "does not run so high with her sugar."  Dr. Chase Caller notified and aware, patient placed on sliding scale, 5 units of insulin coverage administered as per orders for 0400, Dr. Chase Caller notified and aware. Continued with present management and care.

## 2012-09-11 NOTE — Progress Notes (Signed)
[facility] Physician-Brief Progress Note Patient Name: Mary [name] DOB: 1950/12/07 MRN: [id number]  Date of Service  09/11/2012   HPI/Events of Note   sgars 200s and patient asking for her metformin because "I do not run this high on my sugars"  eICU Interventions  RN advised no metformin in ICU Started sensitive sliding scale      Jerry Haugen 09/11/2012, 4:38 AM

## 2012-09-11 NOTE — Progress Notes (Signed)
PULMONARY  / CRITICAL CARE MEDICINE  Name: Mary [name] MRN: [id number] DOB: 07-04-1951    LOS: 59  REFERRING MD :  EDP at [place]:  Resp Distress   BRIEF PATIENT DESCRIPTION:  61 yo female presented to [facility] ER w/ resp distress found to have COPD exacerbation complicated by A-Fib RVR , requiring BIPAP and Cardizem drip .  Transferred to [facility] w/ PCCM admission to ICU 11/16   LINES / TUBES:   CULTURES:   ANTIBIOTICS:   SIGNIFICANT EVENTS:  11/16 >tranx to [facility] from [facility]   LEVEL OF CARE:  ICU  PRIMARY SERVICE:  PCCM  CONSULTANTS:   CODE STATUS: Full  DIET:  NPO  DVT Px:  HEP  GI Px:  PPI    INTERVAL HISTORY:  Weaned off BIPAP , sats adequate Remains in AFIB w/ RVR  IVF at 100cc/hr  started overnight ?  Neg 5L w/ diuresis     VITAL SIGNS: Temp:  [97.5 F (36.4 C)-97.8 F (36.6 C)] 97.8 F (36.6 C) (11/17 0800) Pulse Rate:  [94-145] 117  (11/17 0800) Resp:  [8-32] 20  (11/17 0800) BP: (115-161)/(62-110) 131/71 mmHg (11/17 0800) SpO2:  [86 %-99 %] 99 % (11/17 0806) FiO2 (%):  [40 %] 40 % (11/16 1629) Weight:  [145 lb 4.5 oz (65.9 kg)] 145 lb 4.5 oz (65.9 kg) (11/17 0401) HEMODYNAMICS:   VENTILATOR SETTINGS: Vent Mode:  [-]  FiO2 (%):  [40 %] 40 % INTAKE / OUTPUT: Intake/Output      11/16 0701 - 11/17 0700 11/17 0701 - 11/18 0700   P.O. 60    I.V. (mL/kg) 1027.1 (15.6) 220 (3.3)   IV Piggyback 120    Total Intake(mL/kg) 1207.1 (18.3) 220 (3.3)   Urine (mL/kg/hr) 6125 (3.9)    Total Output 6125    Net -4917.9 +220          PHYSICAL EXAMINATION: General:  Alert on BIPAP  Neuro:  A/ox 3 , follows commands , anxious  HEENT:  Intact  Cardiovascular: Irreg w/ afib /rvr  Lungs:  Decreased BS , bibasilar crackles  Abdomen:  Obese , soft /nt  Musculoskeletal:  Intact  Skin:  Intact    LABS:  Lab 09/11/12 0440 09/10/12 1919 09/10/12 1918 09/10/12 1445 09/10/12 1322 09/10/12 1249 09/10/12 1131 09/10/12 0943  HGB 11.8* -- -- -- -- -- -- 11.7*    WBC 8.6 -- -- -- -- -- -- 8.9  PLT 229 -- -- -- -- -- -- 209  NA 135 -- -- -- -- -- -- 138  K 3.5 -- -- -- -- -- -- 4.4  CL 92* -- -- -- -- -- -- 100  CO2 29 -- -- -- -- -- -- 30  GLUCOSE 353* -- -- -- -- -- -- 107*  BUN 10 -- -- -- -- -- -- 5*  CREATININE 0.41* -- -- -- -- -- -- 0.39*  CALCIUM 9.5 -- -- -- -- -- -- 9.7  MG 1.8 -- 1.2* -- -- -- -- --  PHOS 2.6 -- 3.7 -- -- -- -- --  AST -- -- -- -- -- -- -- 10  ALT -- -- -- -- -- -- -- 6  ALKPHOS -- -- -- -- -- -- -- 61  BILITOT -- -- -- -- -- -- -- 0.5  PROT -- -- -- -- -- -- -- 7.0  ALBUMIN -- -- -- -- -- -- -- 3.7  APTT -- -- -- -- -- -- -- --  INR -- -- -- -- -- -- -- 1.38  LATICACIDVEN -- -- -- -- -- 1.6 -- --  TROPONINI -- -- -- -- <0.30 -- -- <0.30  PROCALCITON -- -- -- -- -- -- -- --  PROBNP -- 4158.0* -- -- -- -- -- 4239.0*  O2SATVEN -- -- -- -- -- -- -- --  PHART -- -- -- 7.410 -- -- 7.352 --  PCO2ART -- -- -- 47.2* -- -- 55.7* --  PO2ART -- -- -- 70.5* -- -- 78.0* --    Lab 09/11/12 0917 09/11/12 0352 09/10/12 1756 09/10/12 1627 09/10/12 1151  GLUCAP 239* 295* 190* 185* 116*   IMAGING: Cxr  Increased interstitial markings, no frank edema.    ECG: atrial fib   DIAGNOSES: Active Problems:  Acute respiratory failure  Hypoxemia  CHF (congestive heart failure)  Pulmonary edema  Atrial fibrillation   ASSESSMENT / PLAN:  PULMONARY  ASSESSMENT : Acute Resp failure w/ COPD exacerbation and volume overload   PLAN:   BIPAP support prn   Pulmonary hygiene w/ nebs -change to xopenex  Hold on abx for now , remains afebrile/nml wbc       CARDIOVASCULAR  ASSESSMENT: Atrial Fib w/ RVR  Enzymes neg , BNP elevated ~4100  Good UOP w/ neg 5 l bal w/ diuresis   PLAN:  Cont  cardizem IV -wean off then transfer to SDU/Tele w/ [facility]  Cont on Pradaxa  Increase po cardizem  Add home metoprolol  2 D echo pending    RENAL  ASSESSMENT:  Hypokalemia , Hypomagnesium   PLAN:   Mg and K replaced  Monitor bmet    GASTROINTESTINAL  ASSESSMENT:   PLAN:   PPI -change to po   HEMATOLOGIC  ASSESSMENT:  Mild Anemia -no sign of active bleeding  PLAN:  Monitor cbc   INFECTIOUS  ASSESSMENT:  No obvious infectious source for now  PLAN:   Monitor wbc /fever curve Hold abx for now   ENDOCRINE  ASSESSMENT:  Hx of DM , BS tr up  PLAN:   SSI  Add lantus  10  Hold oral agents for now  Tolerating po diet   NEUROLOGIC   ASSESSMENT:  Anxiety  PLAN:   Xanax As needed  (decreased dose)  Add home pain meds w/ percocet As needed    CLINICAL SUMMARY:  PARRETT,TAMMY NP -C  PCCM  [phone]   Increased cardizem to 60 mg PO q6 hours, add metoprolol 25 mg PO BID, attempt to get off the drip and once off transfer to [facility].  But hold in the ICU overnight.  Patient seen and examined, agree with above note.  I dictated the care and orders written for this patient under my direction.  Jennet Maduro, M.D. [phone]

## 2012-09-12 ENCOUNTER — Telehealth: Payer: Self-pay | Admitting: Family Medicine

## 2012-09-12 DIAGNOSIS — F17200 Nicotine dependence, unspecified, uncomplicated: Secondary | ICD-10-CM

## 2012-09-12 LAB — URINE CULTURE
Colony Count: NO GROWTH
Culture: NO GROWTH

## 2012-09-12 LAB — BASIC METABOLIC PANEL
Chloride: 95 mEq/L — ABNORMAL LOW (ref 96–112)
GFR calc non Af Amer: >90 mL/min (ref >90)
Glucose, Bld: 199 mg/dL — ABNORMAL HIGH (ref 70–99)
Potassium: 4 mEq/L (ref 3.5–5.1)
Sodium: 135 mEq/L (ref 135–145)

## 2012-09-12 LAB — GLUCOSE, CAPILLARY
Glucose-Capillary: 217 mg/dL — ABNORMAL HIGH (ref 70–99)
Glucose-Capillary: 126 mg/dL — ABNORMAL HIGH (ref 70–99)
Glucose-Capillary: 170 mg/dL — ABNORMAL HIGH (ref 70–99)

## 2012-09-12 MED ORDER — SIMVASTATIN 40 MG PO TABS
40.0000 mg | ORAL_TABLET | Freq: Every day | ORAL | Status: DC
  Filled 2012-09-12: qty 1

## 2012-09-12 MED ORDER — FLUOXETINE HCL 10 MG PO CAPS
10.0000 mg | ORAL_CAPSULE | Freq: Every day | ORAL | Status: DC
  Administered 2012-09-12 – 2012-09-13 (×2): 10 mg via ORAL
  Filled 2012-09-12 (×2): qty 1

## 2012-09-12 MED ORDER — ASPIRIN 81 MG PO CHEW
81.0000 mg | CHEWABLE_TABLET | Freq: Every day | ORAL | Status: DC
  Administered 2012-09-12 – 2012-09-13 (×2): 81 mg via ORAL
  Filled 2012-09-12 (×2): qty 1

## 2012-09-12 MED ORDER — METFORMIN HCL 500 MG PO TABS
500.0000 mg | ORAL_TABLET | Freq: Two times a day (BID) | ORAL | Status: DC
  Administered 2012-09-12 – 2012-09-13 (×2): 500 mg via ORAL
  Filled 2012-09-12 (×4): qty 1

## 2012-09-12 MED ORDER — DILTIAZEM HCL ER COATED BEADS 240 MG PO CP24
240.0000 mg | ORAL_CAPSULE | Freq: Every day | ORAL | Status: DC
  Administered 2012-09-13: 240 mg via ORAL
  Filled 2012-09-12: qty 1

## 2012-09-12 MED ORDER — ATORVASTATIN CALCIUM 20 MG PO TABS
20.0000 mg | ORAL_TABLET | Freq: Every day | ORAL | Status: DC
  Filled 2012-09-12 (×2): qty 1

## 2012-09-12 NOTE — Evaluation (Signed)
Physical Therapy Evaluation Patient Details Name: Mary [name] MRN: [id number] DOB: 1950-10-30 Today's Date: 09/12/2012 Time: [id number] PT Time Calculation (min): 23 min  PT Assessment / Plan / Recommendation Clinical Impression  pt admitted with SOB, afib with RVR due in part from COPD exacerbation.  Pt can benefit from PT on acute to maximize I and activity tolerance.    PT Assessment  Patient needs continued PT services    Follow Up Recommendations  No PT follow up    Does the patient have the potential to tolerate intense rehabilitation      Barriers to Discharge None;Other (comment) (if pt's sister comes to stay for a few days)      Equipment Recommendations  None recommended by PT    Recommendations for Other Services     Frequency Min 3X/week    Precautions / Restrictions Precautions Precautions: None   Pertinent Vitals/Pain EHR with gait max incr to 119 bpm; SaO2 maintain on 2L [place] at 97%     Mobility  Bed Mobility Bed Mobility: Not assessed Transfers Transfers: Sit to Stand;Stand to Sit Sit to Stand: 6: Modified independent (Device/Increase time) Stand to Sit: 6: Modified independent (Device/Increase time) Ambulation/Gait Ambulation/Gait Assistance: 5: Supervision Ambulation Distance (Feet): 200 Feet Assistive device: None Ambulation/Gait Assistance Details: steady gait with no overt LOB with challenges to balance incl. scanning, abrupt turns, incr/decr in gait speed. Gait Pattern: Within Functional Limits;Decreased step length - right;Decreased step length - left;Decreased stride length Gait velocity: slowed, but able to speed up with duing Stairs: No    Shoulder Instructions     Exercises     PT Diagnosis: Other (comment) (decr activity tolerance)  PT Problem List: Decreased activity tolerance;Cardiopulmonary status limiting activity PT Treatment Interventions:     PT Goals Acute Rehab PT Goals PT Goal Formulation: With patient Time For Goal  Achievement: 09/19/12 Potential to Achieve Goals: Good Pt will go Supine/Side to Sit: Independently PT Goal: Supine/Side to Sit - Progress: Goal set today Pt will go Sit to Stand: Independently PT Goal: Sit to Stand - Progress: Goal set today Pt will Transfer Bed to Chair/Chair to Bed: Independently PT Transfer Goal: Bed to Chair/Chair to Bed - Progress: Goal set today Pt will Ambulate: >150 feet;Independently PT Goal: Ambulate - Progress: Goal set today Pt will Go Up / Down Stairs: 1-2 stairs;with modified independence;with rail(s);with least restrictive assistive device PT Goal: Up/Down Stairs - Progress: Goal set today  Visit Information  Last PT Received On: 09/12/12 Assistance Needed: +1    Subjective Data  Subjective: No, I don't do anything that I don't feel I can do at that time....my sister will come stay with me. Patient Stated Goal: Home as soon as I can   Prior [place] Lives With: Alone;Other (Comment) (but sister coming to stay) Available Help at Discharge: Family Type of Home: Apartment Home Access: Stairs to enter Technical brewer of Steps: 1 Entrance Stairs-Rails: None Home Layout: One level Bathroom Shower/Tub: Product/process development scientist: Standard Home Adaptive Equipment: Bedside commode/3-in-1;Shower chair without back;Straight cane;Walker - rolling Prior Function Level of Independence: Independent (occaisionally needs cane when her back pain acts up) Driving: Yes Communication Communication: No difficulties Dominant Hand: Right    Cognition  Overall Cognitive Status: Appears within functional limits for tasks assessed/performed Arousal/Alertness: Awake/alert Orientation Level: Appears intact for tasks assessed Behavior During Session: [facility] for tasks performed (anxious)    Extremity/Trunk Assessment Right Lower Extremity Assessment RLE ROM/Strength/Tone: Within functional levels RLE  Sensation: WFL - Light Touch RLE  Coordination: WFL - gross/fine motor Left Lower Extremity Assessment LLE ROM/Strength/Tone: Within functional levels LLE Sensation: WFL - Light Touch LLE Coordination: WFL - gross/fine motor Trunk Assessment Trunk Assessment: Normal   Balance Balance Balance Assessed: No (no LOB during gait with balance challenges)  End of Session PT - End of Session Activity Tolerance: Patient tolerated treatment well Patient left: in chair;with call bell/phone within reach;with family/visitor present Nurse Communication: Mobility status  GP     Lexey Fletes, [name] 09/12/2012, 10:58 AM  09/12/2012  [name], PT [phone] (pager)

## 2012-09-12 NOTE — Progress Notes (Addendum)
Clinically equivalent dose of Lipitor has been substituted for Pravachol (instead of substituting Zocor) per [facility] P&T committee policy.   Lipitor 20mg  (formulary) is being substituted for Pravachol 80mg  (nonformulary) instead of our usual formulary substitution of Zocor 40 mg due to dose-related risk of myopathy/rhabdomyolysis with Simvastatin (Zocor) doses >10mg /day in patients receiving diltiazem.  Thank you, Nicole Cella, RPh Clinical Pharmacist Pager: [phone] 09/12/2012 15:51

## 2012-09-12 NOTE — Progress Notes (Signed)
Assist patient to bathroom, HR max128 bpm, no shortness of breath and appears to have tolerated well. Assist patient back to bed. Richardean Canal

## 2012-09-12 NOTE — Progress Notes (Signed)
Inpatient Diabetes Program Recommendations  AACE/ADA: New Consensus Statement on Inpatient Glycemic Control (2013)  Target Ranges:  Prepandial:   less than 140 mg/dL      Peak postprandial:   less than 180 mg/dL (1-2 hours)      Critically ill patients:  140 - 180 mg/dL   Reason for Visit: Sub-optimal glycemic control.  Diabetes not included on problem list.  Inpatient Diabetes Program Recommendations Insulin - Basal: Current dose of Lantus is 10 units.  Before breakfast CBG still suboptimal.  Request MD increase Lantus dosage.  Note: Request MD include diabetes on problem list.  See above suggestion regarding increasing Lantus dosage.  Thank you.  Gerhardt Gleed S. Marcelline Mates, RN, CNS, CDE Inpatient Diabetes Program, team pager [phone]

## 2012-09-12 NOTE — Progress Notes (Signed)
PULMONARY  / CRITICAL CARE MEDICINE  Name: Mary [name] MRN: [id number] DOB: 1951-01-10    LOS: 2  REFERRING MD :  EDP at [facility]:  Resp Distress   BRIEF PATIENT DESCRIPTION:  61 yo female presented to [facility] ER w/ resp distress found to have COPD exacerbation complicated by A-Fib RVR , requiring BIPAP and Cardizem drip. Transferred to [facility] w/ PCCM admission to ICU 11/16   LINES / TUBES: none  CULTURES: Reviewed - all neg to date  ANTIBIOTICS: none     SUBJ:  No distress. No complaints. Ambulating without assistance   VITAL SIGNS: Temp:  [98 F (36.7 C)-99.2 F (37.3 C)] 99.1 F (37.3 C) (11/18 0400) Pulse Rate:  [90-124] 103  (11/18 1200) Resp:  [18-27] 22  (11/18 1200) BP: (109-155)/(63-100) 129/71 mmHg (11/18 1200) SpO2:  [90 %-98 %] 93 % (11/18 1200) Weight:  [65.3 kg (143 lb 15.4 oz)] 65.3 kg (143 lb 15.4 oz) (11/18 0400)   INTAKE / OUTPUT: Intake/Output      11/17 0701 - 11/18 0700 11/18 0701 - 11/19 0700   P.O.     I.V. (mL/kg) 736.5 (11.3) 60 (0.9)   IV Piggyback     Total Intake(mL/kg) 736.5 (11.3) 60 (0.9)   Urine (mL/kg/hr) 2485 (1.6) 575 (1.6)   Total Output 2485 575   Net -1748.5 -515        Stool Occurrence  1 x     PHYSICAL EXAMINATION: General:  NAD Neuro:  No focal deficits HEENT:  Intact  Cardiovascular: IRIR, rate controlled  Lungs:  No wheezes  Abdomen:  Obese , soft /nt  Musculoskeletal: no edema Skin:  Intact    LABS:  Lab 09/12/12 0435 09/11/12 1926 09/11/12 0440 09/10/12 1919 09/10/12 1918 09/10/12 1445 09/10/12 1322 09/10/12 1249 09/10/12 1131 09/10/12 0943  HGB -- -- 11.8* -- -- -- -- -- -- 11.7*  WBC -- -- 8.6 -- -- -- -- -- -- 8.9  PLT -- -- 229 -- -- -- -- -- -- 209  NA 135 137 135 -- -- -- -- -- -- --  K 4.0 4.1 -- -- -- -- -- -- -- --  CL 95* 94* 92* -- -- -- -- -- -- --  CO2 29 32 29 -- -- -- -- -- -- --  GLUCOSE 199* 109* 353* -- -- -- -- -- -- --  BUN 9 11 10  -- -- -- -- -- -- --  CREATININE 0.49*  0.53 0.41* -- -- -- -- -- -- --  CALCIUM 9.4 9.9 9.5 -- -- -- -- -- -- --  MG -- -- 1.8 -- 1.2* -- -- -- -- --  PHOS -- -- 2.6 -- 3.7 -- -- -- -- --  AST -- -- -- -- -- -- -- -- -- 10  ALT -- -- -- -- -- -- -- -- -- 6  ALKPHOS -- -- -- -- -- -- -- -- -- 61  BILITOT -- -- -- -- -- -- -- -- -- 0.5  PROT -- -- -- -- -- -- -- -- -- 7.0  ALBUMIN -- -- -- -- -- -- -- -- -- 3.7  APTT -- -- -- -- -- -- -- -- -- --  INR -- -- -- -- -- -- -- -- -- 1.38  LATICACIDVEN -- -- -- -- -- -- -- 1.6 -- --  TROPONINI -- -- -- -- -- -- <0.30 -- -- <0.30  PROCALCITON -- -- -- -- -- -- -- -- -- --  PROBNP -- -- -- 4158.0* -- -- -- -- -- 4239.0*  O2SATVEN -- -- -- -- -- -- -- -- -- --  PHART -- -- -- -- -- 7.410 -- -- 7.352 --  PCO2ART -- -- -- -- -- 47.2* -- -- 55.7* --  PO2ART -- -- -- -- -- 70.5* -- -- 78.0* --    Lab 09/12/12 0816 09/11/12 2158 09/11/12 1929 09/11/12 1524 09/11/12 1130  GLUCAP 217* 166* 110* 191* 254*   IMAGING: No new cxr    DIAGNOSES: Active Problems:  Acute respiratory failure  AECOPD - resolving  Pulmonary edema  CHF/Elevated BNP  Hx of mild diastolic dysfxn  Atrial fibrillation  Rate now controlled  Smoker - last cigarette one wk PTA  Hypokalemia, resolved  Hypomagnesium  Mild Anemia -no sign of active bleeding  Anxiety - improved  DM 2   PLAN: Transfer to Tele Restart home meds inc metformin Cont SSI Transition diltiazem to CD formulation Counseled re: abstinence from smoking Recheck BNP AM 11/19 Anticipate D/C home AM 11/19 Dr Claiborne Billings is cardiologist - need to make sure he is aware of her hospitalization  She will need f/u after D/C home   Merton Border, MD ; [facility] service Mobile [phone].  After 5:30 PM or weekends, call [phone]

## 2012-09-13 DIAGNOSIS — J449 Chronic obstructive pulmonary disease, unspecified: Secondary | ICD-10-CM

## 2012-09-13 LAB — BASIC METABOLIC PANEL
Calcium: 9.7 mg/dL (ref 8.4–10.5)
Chloride: 101 mEq/L (ref 96–112)
Creatinine, Ser: 0.46 mg/dL — ABNORMAL LOW (ref 0.50–1.10)
GFR calc Af Amer: >90 mL/min (ref >90)

## 2012-09-13 LAB — CBC
Platelets: 238 10*3/uL (ref 150–400)
RDW: 15.3 % (ref 11.5–15.5)
WBC: 12.1 10*3/uL — ABNORMAL HIGH (ref 4.0–10.5)

## 2012-09-13 NOTE — Discharge Summary (Signed)
Physician Discharge Summary  Patient ID: Mary [name] MRN: [id number] DOB/AGE: 05-10-1951 61 y.o.  Admit date: 09/10/2012 Discharge date: 09/13/2012    Discharge Diagnoses:  Acute respiratory failure Acute Exacerbation of COPD Hypoxemia Pulmonary Edema Tobacco Abuse CHF (congestive heart failure) Atrial fibrillation Diabetes Hypokalemia Hypomagnesemia Anemia Anxiety Depression  Brief Summary: Mary [name] is a 61 y.o. y/o female with a PMH of Afib, COPD, DM who presented to [facility] ER w/ resp distress found to have COPD exacerbation complicated by A-Fib RVR , requiring BIPAP and Cardizem drip. Transferred to [facility] w/ PCCM admission to ICU 11/16.      Microbiology/Sepsis markers: MRSA PCR 11/16>>>neg BCx2 11/16>>>ng UC 11/16>>>neg BCx2 11/16>>>ng                                                                      Hospital Summary by Discharge Diagnosis  Acute respiratory failure Acute Exacerbation of COPD Hypoxemia Pulmonary Edema Tobacco Abuse Acute Respiratory Failure in setting of exacerbation of COPD compounded by Afib with RVR & volume overload.  Initially presented to [facility] ER with complaints of progressively worsening shortness of breath over 48 hours, heart racing, headache, productive cough, lower extremity swelling and intermittent chest pain.  Noted to have AECOPD & Afib with RVR - placed on cardizem gtt, BiPAP and transferred to [facility] for evaluation.  Monitored in ICU briefly.  Responded well to diuresis, rate control & BiPAP.  No evidence of acute infectious process noted during admit.  Cultures to date are negative. Patient was weaned off O2 prior to discharge.  No home needs identified.   Discharge Plan: -smoking cessation -continue daily symbicort & PRN albuterol (unsure patient is using properly as she indicates she only used symbicort PRN prior to admit).  Needs continued reinforcement.  -follow up with PCP in [place] -consider outpatient PFT's   -follow up with Dr. Elsworth Soho in office   CHF (congestive heart failure) Atrial fibrillation HLD Known chronic afib on Pradaxa and metoprolol.  Admitted with AFIB with RVR, CHF decompensation.  Required IV cardizem for rate control and aggressive diuresis.  BNP elevated on admit (4100).  She responded well to diuresis with greater than 7L output during admit. Troponin was negative during admit.    Discharge Plan: -consider follow up 2D ECHO -follow up with Dr. Claiborne Billings as outpatient (called office, they indicate they will call pt with appt) -continue pravachol -continue home PRN lasix dosing as outpatient -continue ASA, Pradaxa, Cardizem, Metoprolol  Diabetes Known history prior to admit.  On metformin.    Discharge Plan: -continue metformin -follow up with PCP   Hypokalemia Hypomagnesemia Transient, noted during admit with diuresis.    Discharge Plan: -no further interventions  Anemia Mild, no transfusions required.   Discharge Plan: -follow up per PCP  Anxiety Depression  Discharge Plan: -continue home PRN xanax -continue prozac   Discharge Exam: General: NAD  Neuro: No focal deficits  HEENT: MM pink / moist, no JVD Cardiovascular: s1s2 IRIR, rate controlled  Lungs: No wheezes  Abdomen: Obese , soft /nt  Musculoskeletal: no edema  Skin: Intact   Filed Vitals:   09/12/12 1619 09/12/12 2100 09/13/12 0500 09/13/12 0900  BP: 143/94 112/67 135/89   Pulse: 108 98 109  Temp: 98.3 F (36.8 C) 98.5 F (36.9 C) 98.7 F (37.1 C)   TempSrc:  Oral Oral   Resp: 17 18 18    Height: 5\' 7"  (1.702 m)     Weight: 139 lb 11.2 oz (63.368 kg)  136 lb 8 oz (61.916 kg)   SpO2: 93% 97% 98% 97%     Discharge Labs  BMET  Lab 09/13/12 0510 09/12/12 0435 09/11/12 1926 09/11/12 0440 09/10/12 1918 09/10/12 0943  NA 140 135 137 135 -- 138  K 3.5 4.0 -- -- -- --  CL 101 95* 94* 92* -- 100  CO2 29 29 32 29 -- 30  GLUCOSE 151* 199* 109* 353* -- 107*  BUN 8 9 11 10  -- 5*    CREATININE 0.46* 0.49* 0.53 0.41* -- 0.39*  CALCIUM 9.7 9.4 9.9 9.5 -- 9.7  MG -- -- -- 1.8 1.2* --  PHOS -- -- -- 2.6 3.7 --   CBC  Lab 09/13/12 0510 09/11/12 0440 09/10/12 0943  HGB 12.6 11.8* 11.7*  HCT 38.7 35.7* 36.2  WBC 12.1* 8.6 8.9  PLT 238 229 209   Anti-Coagulation  Lab 09/10/12 0943  INR 1.38    Discharge Orders    Future Appointments: Provider: Department: Dept Phone: Center:   09/16/2012 9:15 AM Alycia Rossetti, MD [facility] [phone] [facility]   10/13/2012 3:15 PM Rigoberto Noel, MD [facility] [phone] None   11/10/2012 11:00 AM Alycia Rossetti, MD [facility] [phone] [facility]     Future Orders Please Complete By Expires   Diet - low sodium heart healthy      Increase activity slowly      Discharge instructions      Comments:   Weigh yourself daily and record - take with you to see Dr. Claiborne Billings. STOP SMOKING!   Call MD for:  temperature >100.4      Call MD for:  difficulty breathing, headache or visual disturbances      (HEART FAILURE PATIENTS) Call MD:  Anytime you have any of the following symptoms: 1) 3 pound weight gain in 24 hours or 5 pounds in 1 week 2) shortness of breath, with or without a dry hacking cough 3) swelling in the hands, feet or stomach 4) if you have to sleep on extra pillows at night in order to breathe.            Follow-up Information    Follow up with Vic Blackbird, MD. On 09/16/2012. (Appt at 9:15 am)    Contact information:   [address] [place] [address] [phone]       Follow up with Troy Sine, MD. (Office will call you with an appointment.  )    Contact information:   [address] [place] [address] [phone]       Follow up with Rigoberto Noel., MD. On 10/13/2012. (Appt at 3:15 )    Contact information:   [address] [address] [phone]           Medication List     As of 09/13/2012 11:41 AM    CONTINUE  taking these medications         albuterol 108 (90 BASE) MCG/ACT inhaler   Commonly known as: PROVENTIL HFA;VENTOLIN HFA      ALPRAZolam 1 MG tablet   Commonly known as: XANAX   Take 1 tablet (1 mg total) by mouth 3 (three) times daily  as needed.      aspirin 81 MG tablet      budesonide-formoterol 160-4.5 MCG/ACT inhaler   Commonly known as: SYMBICORT   Inhale 2 puffs into the lungs 2 (two) times daily.      calcium gluconate 500 MG tablet      diltiazem 240 MG 24 hr capsule   Commonly known as: TIAZAC      FLUoxetine 10 MG capsule   Commonly known as: PROZAC      furosemide 20 MG tablet   Commonly known as: LASIX      metFORMIN 500 MG tablet   Commonly known as: GLUCOPHAGE      metoprolol tartrate 25 MG tablet   Commonly known as: LOPRESSOR      multivitamin with minerals Tabs      oxyCODONE-acetaminophen 5-325 MG per tablet   Commonly known as: PERCOCET/ROXICET      PRADAXA 150 MG Caps   Generic drug: dabigatran      pravastatin 80 MG tablet   Commonly known as: PRAVACHOL      promethazine 12.5 MG tablet   Commonly known as: PHENERGAN   Take 1 tablet (12.5 mg total) by mouth every 6 (six) hours as needed for nausea.      SALONPAS PAIN RELIEF PATCH Pads      sodium chloride 0.65 % nasal spray   Commonly known as: OCEAN         Disposition: 01-Home or Self Care  Discharged Condition: Mary [name] has met maximum benefit of inpatient care and is medically stable and cleared for discharge.  Patient is pending follow up as above.      Time spent on disposition:  Greater than 35 minutes.   Signed: Noe Gens, NP-C [facility] Pgr: [phone]  Kara Mead MD

## 2012-09-13 NOTE — Progress Notes (Signed)
Physical Therapy Treatment Patient Details Name: Mary [name] MRN: [id number] DOB: 1951-05-05 Today's Date: 09/13/2012 Time: [id number] PT Time Calculation (min): 15 min  PT Assessment / Plan / Recommendation Comments on Treatment Session  pt rpesents with COPD Exacerbation.  pt moving well and should continue to progress at home.      Follow Up Recommendations  No PT follow up     Does the patient have the potential to tolerate intense rehabilitation     Barriers to Discharge        Equipment Recommendations  None recommended by PT    Recommendations for Other Services    Frequency Min 3X/week   Plan Discharge plan remains appropriate;Frequency remains appropriate    Precautions / Restrictions Precautions Precautions: None Restrictions Weight Bearing Restrictions: No   Pertinent Vitals/Pain Denies pain.    Mobility  Bed Mobility Bed Mobility: Not assessed Transfers Transfers: Sit to Stand;Stand to Sit Sit to Stand: 6: Modified independent (Device/Increase time) Stand to Sit: 6: Modified independent (Device/Increase time) Ambulation/Gait Ambulation/Gait Assistance: 5: Supervision Ambulation Distance (Feet): 250 Feet Assistive device: None Ambulation/Gait Assistance Details: pt moves slowly, but steadily.  Cues needs for less talking and better pursed lip breathing.   Gait Pattern: Within Functional Limits;Decreased step length - right;Decreased step length - left;Decreased stride length Stairs: Yes Stairs Assistance: 5: Supervision Stairs Assistance Details (indicate cue type and reason): pt moves slowly and uses rail, but demos good technique.   Stair Management Technique: One rail Right;Forwards Number of Stairs: 11  Wheelchair Mobility Wheelchair Mobility: No    Exercises     PT Diagnosis:    PT Problem List:   PT Treatment Interventions:     PT Goals Acute Rehab PT Goals Time For Goal Achievement: 09/19/12 PT Goal: Sit to Stand - Progress: Progressing  toward goal PT Goal: Ambulate - Progress: Progressing toward goal PT Goal: Up/Down Stairs - Progress: Progressing toward goal  Visit Information  Last PT Received On: 09/13/12 Assistance Needed: +1    Subjective Data  Subjective: I hope I'm going home today.     Cognition  Overall Cognitive Status: Appears within functional limits for tasks assessed/performed Arousal/Alertness: Awake/alert Orientation Level: Appears intact for tasks assessed Behavior During Session: [facility] for tasks performed    Balance  Balance Balance Assessed: No  End of Session PT - End of Session Equipment Utilized During Treatment: Gait belt Activity Tolerance: Patient tolerated treatment well Patient left: in bed;with call bell/phone within reach (sitting EOB) Nurse Communication: Mobility status   GP     [name], [place] 09/13/2012, 11:58 AM

## 2012-09-15 LAB — CULTURE, BLOOD (ROUTINE X 2)

## 2012-09-16 ENCOUNTER — Ambulatory Visit: Payer: Medicare Other | Admitting: Family Medicine

## 2012-09-17 LAB — CULTURE, BLOOD (ROUTINE X 2): Culture: NO GROWTH

## 2012-09-19 ENCOUNTER — Encounter: Payer: Self-pay | Admitting: Family Medicine

## 2012-09-19 ENCOUNTER — Ambulatory Visit (INDEPENDENT_AMBULATORY_CARE_PROVIDER_SITE_OTHER): Payer: Medicare Other | Admitting: Family Medicine

## 2012-09-19 VITALS — BP 110/70 | HR 74 | Resp 15 | Ht 67.0 in | Wt 138.0 lb

## 2012-09-19 DIAGNOSIS — F411 Generalized anxiety disorder: Secondary | ICD-10-CM

## 2012-09-19 DIAGNOSIS — F419 Anxiety disorder, unspecified: Secondary | ICD-10-CM

## 2012-09-19 DIAGNOSIS — J449 Chronic obstructive pulmonary disease, unspecified: Secondary | ICD-10-CM

## 2012-09-19 DIAGNOSIS — B370 Candidal stomatitis: Secondary | ICD-10-CM

## 2012-09-19 DIAGNOSIS — J4489 Other specified chronic obstructive pulmonary disease: Secondary | ICD-10-CM

## 2012-09-19 DIAGNOSIS — I4891 Unspecified atrial fibrillation: Secondary | ICD-10-CM

## 2012-09-19 DIAGNOSIS — Z720 Tobacco use: Secondary | ICD-10-CM

## 2012-09-19 DIAGNOSIS — F17200 Nicotine dependence, unspecified, uncomplicated: Secondary | ICD-10-CM

## 2012-09-19 MED ORDER — NYSTATIN 100000 UNIT/ML MT SUSP: 500000.0000 [IU] | Freq: Four times a day (QID) | OROMUCOSAL | Status: DC

## 2012-09-19 MED ORDER — ALBUTEROL SULFATE HFA 108 (90 BASE) MCG/ACT IN AERS: 2.0000 | INHALATION_SPRAY | RESPIRATORY_TRACT | Status: DC | PRN

## 2012-09-19 MED ORDER — ALPRAZOLAM 1 MG PO TABS: 1.0000 mg | ORAL_TABLET | Freq: Three times a day (TID) | ORAL | Status: DC | PRN

## 2012-09-19 MED ORDER — OXYCODONE-ACETAMINOPHEN 7.5-325 MG PO TABS: 1.0000 | ORAL_TABLET | Freq: Four times a day (QID) | ORAL | Status: DC | PRN

## 2012-09-19 NOTE — Telephone Encounter (Signed)
Pt in today for OV.

## 2012-09-19 NOTE — Patient Instructions (Signed)
Use the nystatin for thrush Small sips of water as needed Pain meds refilled can not get until December  Albuterol refilled Keep previous f/u appt

## 2012-09-19 NOTE — Progress Notes (Signed)
  Subjective:    Patient ID: Mary [name], female    DOB: [birth year], 62 y.o.   MRN: [id number]  HPI  Patient here to follow-up hospital admission for COPD and A. fib with RVR. She was also found to have mild pulmonary edema after A. fib with RVR. She is doing well and her weight has been stable. She denies any shortness of breath. She's been using her inhalers as prescribed. She does complain of soreness in her mouth and her tongue feels like it's raw  Review of Systems  GEN- + fatigue, fever, weight loss,weakness, recent illness HEENT- denies eye drainage, change in vision, nasal discharge, CVS- denies chest pain, palpitations RESP- denies SOB, cough, wheeze ABD- denies N/V, change in stools, abd pain GU- denies dysuria, hematuria, dribbling, incontinence MSK- + joint pain, muscle aches, injury Neuro- denies headache, dizziness, syncope, seizure activity      Objective:   Physical Exam GEN- NAD alert and oriented x3 , fatigued appearing, walks with cane HEENT- PERRL, EOMI, +thrush, MMM, nares clear,  Neck- supple, no LAD CVS- Irregular, Irregular rhythm, no murmur RSP-CTAB no rhonchi, normal WOB EXT- no edema Pulse- radial and DP 2+       Assessment & Plan:

## 2012-09-20 ENCOUNTER — Encounter: Payer: Self-pay | Admitting: Family Medicine

## 2012-09-20 DIAGNOSIS — B370 Candidal stomatitis: Secondary | ICD-10-CM | POA: Insufficient documentation

## 2012-09-20 NOTE — Assessment & Plan Note (Signed)
She will establish with [facility] pulmonary as an outpatient she is using her symbicort, albuterol refilled

## 2012-09-20 NOTE — Assessment & Plan Note (Signed)
Treat with nystatin. 

## 2012-09-20 NOTE — Assessment & Plan Note (Signed)
Medications refilled, continue prozac and xanax

## 2012-09-20 NOTE — Assessment & Plan Note (Signed)
Discussed importance of smoking cessation. 

## 2012-09-20 NOTE — Assessment & Plan Note (Signed)
Afib followed by cards, now rate controlled, on ASA and pradaxa, doing well with exception of some mild fatigue

## 2012-10-13 ENCOUNTER — Other Ambulatory Visit: Payer: Self-pay

## 2012-10-13 ENCOUNTER — Inpatient Hospital Stay: Payer: Medicare Other | Admitting: Pulmonary Disease

## 2012-10-13 ENCOUNTER — Telehealth: Payer: Self-pay | Admitting: Family Medicine

## 2012-10-13 MED ORDER — ALPRAZOLAM 1 MG PO TABS: 1.0000 mg | ORAL_TABLET | Freq: Three times a day (TID) | ORAL | Status: DC | PRN

## 2012-10-13 MED ORDER — OXYCODONE-ACETAMINOPHEN 7.5-325 MG PO TABS: 1.0000 | ORAL_TABLET | Freq: Four times a day (QID) | ORAL | Status: DC | PRN

## 2012-10-13 NOTE — Telephone Encounter (Signed)
Patient aware that she can come and get rx the week of 12/23.

## 2012-11-08 ENCOUNTER — Other Ambulatory Visit: Payer: Self-pay | Admitting: Family Medicine

## 2012-11-10 ENCOUNTER — Ambulatory Visit (INDEPENDENT_AMBULATORY_CARE_PROVIDER_SITE_OTHER): Payer: Medicare Other | Admitting: Family Medicine

## 2012-11-10 ENCOUNTER — Encounter: Payer: Self-pay | Admitting: Family Medicine

## 2012-11-10 ENCOUNTER — Other Ambulatory Visit: Payer: Self-pay | Admitting: Family Medicine

## 2012-11-10 VITALS — BP 124/62 | HR 86 | Resp 18 | Ht 67.0 in | Wt 139.0 lb

## 2012-11-10 DIAGNOSIS — Z79899 Other long term (current) drug therapy: Secondary | ICD-10-CM

## 2012-11-10 DIAGNOSIS — Z1211 Encounter for screening for malignant neoplasm of colon: Secondary | ICD-10-CM

## 2012-11-10 DIAGNOSIS — I10 Essential (primary) hypertension: Secondary | ICD-10-CM

## 2012-11-10 DIAGNOSIS — E785 Hyperlipidemia, unspecified: Secondary | ICD-10-CM

## 2012-11-10 DIAGNOSIS — Z720 Tobacco use: Secondary | ICD-10-CM

## 2012-11-10 DIAGNOSIS — E119 Type 2 diabetes mellitus without complications: Secondary | ICD-10-CM

## 2012-11-10 DIAGNOSIS — I4891 Unspecified atrial fibrillation: Secondary | ICD-10-CM

## 2012-11-10 DIAGNOSIS — J449 Chronic obstructive pulmonary disease, unspecified: Secondary | ICD-10-CM

## 2012-11-10 DIAGNOSIS — G8929 Other chronic pain: Secondary | ICD-10-CM

## 2012-11-10 DIAGNOSIS — F17200 Nicotine dependence, unspecified, uncomplicated: Secondary | ICD-10-CM

## 2012-11-10 MED ORDER — OXYCODONE-ACETAMINOPHEN 7.5-325 MG PO TABS: 1.0000 | ORAL_TABLET | Freq: Four times a day (QID) | ORAL | Status: DC | PRN

## 2012-11-10 MED ORDER — ALPRAZOLAM 1 MG PO TABS: 1.0000 mg | ORAL_TABLET | Freq: Three times a day (TID) | ORAL | Status: DC | PRN

## 2012-11-10 NOTE — Patient Instructions (Addendum)
Get the labs done fasting Bring the stool cards back for colon cancer screening I recommend shingles vaccine- get from pharmacy Pain medications refilled  F/U 3 months

## 2012-11-11 LAB — CBC
MCV: 89.2 fL (ref 78.0–100.0)
Platelets: 210 10*3/uL (ref 150–400)
RDW: 14.9 % (ref 11.5–15.5)
WBC: 9.6 10*3/uL (ref 4.0–10.5)

## 2012-11-11 LAB — ESTIMATED GFR: GFR, Est Non African American: >89 mL/min

## 2012-11-11 LAB — BASIC METABOLIC PANEL
BUN: 9 mg/dL (ref 6–23)
Chloride: 102 mEq/L (ref 96–112)
Glucose, Bld: 80 mg/dL (ref 70–99)
Potassium: 5 mEq/L (ref 3.5–5.3)

## 2012-11-11 LAB — LIPID PANEL
HDL: 48 mg/dL (ref >39)
LDL Cholesterol: 58 mg/dL (ref 0–99)
Total CHOL/HDL Ratio: 2.5 Ratio

## 2012-11-13 ENCOUNTER — Encounter: Payer: Self-pay | Admitting: Family Medicine

## 2012-11-13 DIAGNOSIS — Z1211 Encounter for screening for malignant neoplasm of colon: Secondary | ICD-10-CM | POA: Insufficient documentation

## 2012-11-13 NOTE — Assessment & Plan Note (Signed)
Well controlled, will send labs to cardiology

## 2012-11-13 NOTE — Assessment & Plan Note (Addendum)
She is unable to establish with pulmonary at this time, continue inhalers, she needs to continue to work on cessation

## 2012-11-13 NOTE — Assessment & Plan Note (Signed)
Rate controlled on pradaxa

## 2012-11-13 NOTE — Assessment & Plan Note (Signed)
She continues to cut back on tobacco, finding it difficult to quit entirely

## 2012-11-13 NOTE — Assessment & Plan Note (Signed)
She declines colonoscopy, agrees to FOBT with stool cards

## 2012-11-13 NOTE — Assessment & Plan Note (Addendum)
Fasting labs show good control A1C 6.8%, no change to meds

## 2012-11-13 NOTE — Progress Notes (Signed)
  Subjective:    Patient ID: Mary [name], female    DOB: April 01, 1951, 62 y.o.   MRN: [id number]  HPI Pt here to f/u chronic medical problems, doing well has no specific concerns DM- tolerating metformin, no hypoglycemia, CBG < 120 fasting Due for fasting labs  Afib- followed by cardiology, on pradaxa  COPD- breathing has been stable, has not seen pulmonary as recommended, unable to afford co pay Chronic pain- needs refill on pain medications, pain worse in cold weather Declines colon cancer screening   Review of Systems   GEN- denies fatigue, fever, weight loss,weakness, recent illness HEENT- denies eye drainage, change in vision, nasal discharge, CVS- denies chest pain, palpitations RESP- denies SOB, cough, wheeze ABD- denies N/V, change in stools, abd pain GU- denies dysuria, hematuria, dribbling, incontinence MSK- + joint pain, muscle aches, injury Neuro- denies headache, dizziness, syncope, seizure activity      Objective:   Physical Exam GEN- NAD alert and oriented x3 ,  HEENT- PERRL, EOMI, oropharynx,clear MMM, nares clear,  Neck- supple, no bruit CVS- Irregular, Irregular rhythm, no murmur RSP-CTAB  EXT- no edema Pulse- radial and DP 2+         Assessment & Plan:

## 2012-11-13 NOTE — Assessment & Plan Note (Signed)
UDS obtained, pain meds given

## 2012-11-24 ENCOUNTER — Other Ambulatory Visit: Payer: Self-pay

## 2012-11-24 ENCOUNTER — Other Ambulatory Visit: Payer: Self-pay | Admitting: Family Medicine

## 2012-11-24 MED ORDER — BUDESONIDE-FORMOTEROL FUMARATE 160-4.5 MCG/ACT IN AERO: 2.0000 | INHALATION_SPRAY | Freq: Two times a day (BID) | RESPIRATORY_TRACT | Status: DC

## 2012-11-28 ENCOUNTER — Telehealth: Payer: Self-pay | Admitting: Family Medicine

## 2012-11-28 MED ORDER — NYSTATIN 100000 UNIT/ML MT SUSP: 500000.0000 [IU] | Freq: Four times a day (QID) | OROMUCOSAL | Status: DC

## 2012-11-28 NOTE — Telephone Encounter (Signed)
Refill sent in

## 2012-12-02 ENCOUNTER — Telehealth: Payer: Self-pay | Admitting: Family Medicine

## 2012-12-02 NOTE — Telephone Encounter (Signed)
States her feet and ankles have been swelling. Both are swollen but especially her right one. Swollen all the way down to her toes. Swollen just as bad when she wakes up in the am than when she went to bed.

## 2012-12-02 NOTE — Telephone Encounter (Signed)
Called patient and left message for them to return call at the office   

## 2012-12-02 NOTE — Telephone Encounter (Signed)
Tell her to increase lasix to 20mg  twice a day for the next 3 days, Call Monday if not improved for appointment If she gets chest pain or SOB go to ER No salt

## 2012-12-02 NOTE — Telephone Encounter (Signed)
Patient aware.

## 2012-12-05 ENCOUNTER — Telehealth: Payer: Self-pay | Admitting: Family Medicine

## 2012-12-06 ENCOUNTER — Ambulatory Visit (INDEPENDENT_AMBULATORY_CARE_PROVIDER_SITE_OTHER): Payer: Medicare Other | Admitting: Family Medicine

## 2012-12-06 ENCOUNTER — Encounter: Payer: Self-pay | Admitting: Family Medicine

## 2012-12-06 ENCOUNTER — Emergency Department (HOSPITAL_COMMUNITY): Payer: Medicare Other

## 2012-12-06 ENCOUNTER — Observation Stay (HOSPITAL_COMMUNITY)
Admission: EM | Admit: 2012-12-06 | Discharge: 2012-12-07 | Disposition: A | Discharge status: Home / Self Care | Payer: Medicare Other | Attending: Internal Medicine | Admitting: Internal Medicine

## 2012-12-06 ENCOUNTER — Encounter (HOSPITAL_COMMUNITY): Payer: Self-pay | Admitting: *Deleted

## 2012-12-06 VITALS — BP 136/84 | HR 120 | Resp 18 | Ht 67.0 in | Wt 141.0 lb

## 2012-12-06 DIAGNOSIS — R4182 Altered mental status, unspecified: Secondary | ICD-10-CM | POA: Diagnosis present

## 2012-12-06 DIAGNOSIS — F17200 Nicotine dependence, unspecified, uncomplicated: Secondary | ICD-10-CM | POA: Insufficient documentation

## 2012-12-06 DIAGNOSIS — J4489 Other specified chronic obstructive pulmonary disease: Secondary | ICD-10-CM | POA: Insufficient documentation

## 2012-12-06 DIAGNOSIS — F419 Anxiety disorder, unspecified: Secondary | ICD-10-CM | POA: Diagnosis present

## 2012-12-06 DIAGNOSIS — E119 Type 2 diabetes mellitus without complications: Secondary | ICD-10-CM | POA: Diagnosis present

## 2012-12-06 DIAGNOSIS — F411 Generalized anxiety disorder: Secondary | ICD-10-CM | POA: Insufficient documentation

## 2012-12-06 DIAGNOSIS — E785 Hyperlipidemia, unspecified: Secondary | ICD-10-CM | POA: Diagnosis present

## 2012-12-06 DIAGNOSIS — I4891 Unspecified atrial fibrillation: Secondary | ICD-10-CM

## 2012-12-06 DIAGNOSIS — J449 Chronic obstructive pulmonary disease, unspecified: Secondary | ICD-10-CM | POA: Insufficient documentation

## 2012-12-06 DIAGNOSIS — F3289 Other specified depressive episodes: Secondary | ICD-10-CM | POA: Insufficient documentation

## 2012-12-06 DIAGNOSIS — I509 Heart failure, unspecified: Secondary | ICD-10-CM | POA: Insufficient documentation

## 2012-12-06 DIAGNOSIS — Z720 Tobacco use: Secondary | ICD-10-CM | POA: Diagnosis present

## 2012-12-06 DIAGNOSIS — F329 Major depressive disorder, single episode, unspecified: Secondary | ICD-10-CM | POA: Diagnosis present

## 2012-12-06 DIAGNOSIS — F32A Depression, unspecified: Secondary | ICD-10-CM | POA: Diagnosis present

## 2012-12-06 DIAGNOSIS — I5030 Unspecified diastolic (congestive) heart failure: Principal | ICD-10-CM | POA: Diagnosis present

## 2012-12-06 LAB — CBC WITH DIFFERENTIAL/PLATELET
Basophils Relative: 0 % (ref 0–1)
HCT: 37.7 % (ref 36.0–46.0)
Hemoglobin: 12.4 g/dL (ref 12.0–15.0)
Lymphocytes Relative: 20 % (ref 12–46)
Lymphs Abs: 1.9 10*3/uL (ref 0.7–4.0)
Monocytes Absolute: 0.5 10*3/uL (ref 0.1–1.0)
Monocytes Relative: 5 % (ref 3–12)
Neutro Abs: 7.2 10*3/uL (ref 1.7–7.7)
Neutrophils Relative %: 75 % (ref 43–77)
RBC: 4.09 MIL/uL (ref 3.87–5.11)
WBC: 9.7 10*3/uL (ref 4.0–10.5)

## 2012-12-06 LAB — COMPREHENSIVE METABOLIC PANEL
Albumin: 4.7 g/dL (ref 3.5–5.2)
Alkaline Phosphatase: 55 U/L (ref 39–117)
BUN: 6 mg/dL (ref 6–23)
CO2: 30 mEq/L (ref 19–32)
Chloride: 101 mEq/L (ref 96–112)
Creatinine, Ser: 0.47 mg/dL — ABNORMAL LOW (ref 0.50–1.10)
GFR calc non Af Amer: >90 mL/min (ref >90)
Glucose, Bld: 154 mg/dL — ABNORMAL HIGH (ref 70–99)
Potassium: 3.8 mEq/L (ref 3.5–5.1)
Total Bilirubin: 0.4 mg/dL (ref 0.3–1.2)

## 2012-12-06 LAB — LIPASE, BLOOD: Lipase: 23 U/L (ref 11–59)

## 2012-12-06 LAB — ETHANOL: Alcohol, Ethyl (B): <11 mg/dL (ref 0–11)

## 2012-12-06 MED ORDER — ASPIRIN 81 MG PO CHEW
81.0000 mg | CHEWABLE_TABLET | Freq: Every day | ORAL | Status: DC
Start: 2012-12-06 — End: 2012-12-07
  Administered 2012-12-07: 81 mg via ORAL
  Filled 2012-12-06: qty 1

## 2012-12-06 MED ORDER — DEXTROSE 5 % IV SOLN
5.0000 mg/h | Freq: Once | INTRAVENOUS | Status: AC
  Administered 2012-12-06: 5 mg/h via INTRAVENOUS
  Filled 2012-12-06: qty 100

## 2012-12-06 MED ORDER — SIMVASTATIN 40 MG PO TABS: 40.0000 mg | ORAL_TABLET | Freq: Every day | ORAL | Status: DC

## 2012-12-06 MED ORDER — DILTIAZEM HCL 30 MG PO TABS
30.0000 mg | ORAL_TABLET | Freq: Once | ORAL | Status: AC
  Administered 2012-12-06: 30 mg via ORAL

## 2012-12-06 MED ORDER — OXYCODONE-ACETAMINOPHEN 5-325 MG PO TABS
1.5000 | ORAL_TABLET | Freq: Four times a day (QID) | ORAL | Status: DC | PRN
  Administered 2012-12-07: 1.5 via ORAL
  Filled 2012-12-06: qty 2

## 2012-12-06 MED ORDER — BUDESONIDE-FORMOTEROL FUMARATE 160-4.5 MCG/ACT IN AERO
INHALATION_SPRAY | RESPIRATORY_TRACT | Status: AC
  Filled 2012-12-06: qty 6

## 2012-12-06 MED ORDER — DILTIAZEM HCL ER BEADS 240 MG PO CP24
240.0000 mg | ORAL_CAPSULE | Freq: Every day | ORAL | Status: DC
  Filled 2012-12-06 (×2): qty 1

## 2012-12-06 MED ORDER — METOPROLOL TARTRATE 50 MG PO TABS
50.0000 mg | ORAL_TABLET | Freq: Every day | ORAL | Status: DC
  Administered 2012-12-07: 50 mg via ORAL
  Filled 2012-12-06: qty 1

## 2012-12-06 MED ORDER — SODIUM CHLORIDE 0.9 % IV SOLN
INTRAVENOUS | Status: DC
  Administered 2012-12-06: 14:00:00 via INTRAVENOUS

## 2012-12-06 MED ORDER — NALOXONE HCL 0.4 MG/ML IJ SOLN
INTRAMUSCULAR | Status: AC
  Administered 2012-12-06: 0.4 mg via INTRAVENOUS
  Filled 2012-12-06: qty 1

## 2012-12-06 MED ORDER — INSULIN ASPART 100 UNIT/ML ~~LOC~~ SOLN: 0.0000 [IU] | Freq: Every day | SUBCUTANEOUS | Status: DC

## 2012-12-06 MED ORDER — ADULT MULTIVITAMIN W/MINERALS CH
1.0000 | ORAL_TABLET | Freq: Every day | ORAL | Status: DC
  Administered 2012-12-07: 1 via ORAL
  Filled 2012-12-06: qty 1

## 2012-12-06 MED ORDER — SODIUM CHLORIDE 0.9 % IJ SOLN: 3.0000 mL | INTRAMUSCULAR | Status: DC | PRN

## 2012-12-06 MED ORDER — SODIUM CHLORIDE 0.9 % IV SOLN
INTRAVENOUS | Status: AC
  Administered 2012-12-06: 22:00:00 via INTRAVENOUS

## 2012-12-06 MED ORDER — SODIUM CHLORIDE 0.9 % IV SOLN: 250.0000 mL | INTRAVENOUS | Status: DC | PRN

## 2012-12-06 MED ORDER — DABIGATRAN ETEXILATE MESYLATE 150 MG PO CAPS
150.0000 mg | ORAL_CAPSULE | Freq: Two times a day (BID) | ORAL | Status: DC
  Administered 2012-12-06 – 2012-12-07 (×2): 150 mg via ORAL
  Filled 2012-12-06 (×7): qty 1

## 2012-12-06 MED ORDER — NALOXONE HCL 0.4 MG/ML IJ SOLN
0.4000 mg | Freq: Once | INTRAMUSCULAR | Status: AC
  Administered 2012-12-06: 0.4 mg via INTRAVENOUS

## 2012-12-06 MED ORDER — BUDESONIDE-FORMOTEROL FUMARATE 160-4.5 MCG/ACT IN AERO
2.0000 | INHALATION_SPRAY | Freq: Two times a day (BID) | RESPIRATORY_TRACT | Status: DC
  Administered 2012-12-06 – 2012-12-07 (×2): 2 via RESPIRATORY_TRACT
  Filled 2012-12-06: qty 6

## 2012-12-06 MED ORDER — OXYCODONE-ACETAMINOPHEN 7.5-325 MG PO TABS: 1.0000 | ORAL_TABLET | Freq: Four times a day (QID) | ORAL | Status: DC | PRN

## 2012-12-06 MED ORDER — ATORVASTATIN CALCIUM 20 MG PO TABS
20.0000 mg | ORAL_TABLET | Freq: Every day | ORAL | Status: DC
  Administered 2012-12-06: 20 mg via ORAL
  Filled 2012-12-06 (×3): qty 1

## 2012-12-06 MED ORDER — PROMETHAZINE HCL 12.5 MG PO TABS: 12.5000 mg | ORAL_TABLET | Freq: Four times a day (QID) | ORAL | Status: DC | PRN

## 2012-12-06 MED ORDER — DABIGATRAN ETEXILATE MESYLATE 150 MG PO CAPS
ORAL_CAPSULE | ORAL | Status: AC
  Filled 2012-12-06: qty 1

## 2012-12-06 MED ORDER — ALPRAZOLAM 1 MG PO TABS
1.0000 mg | ORAL_TABLET | Freq: Three times a day (TID) | ORAL | Status: DC | PRN
  Administered 2012-12-06: 1 mg via ORAL
  Filled 2012-12-06: qty 1

## 2012-12-06 MED ORDER — ALBUTEROL SULFATE HFA 108 (90 BASE) MCG/ACT IN AERS: 2.0000 | INHALATION_SPRAY | RESPIRATORY_TRACT | Status: DC | PRN

## 2012-12-06 MED ORDER — METOPROLOL TARTRATE 25 MG PO TABS
25.0000 mg | ORAL_TABLET | Freq: Every day | ORAL | Status: DC
  Filled 2012-12-06: qty 1

## 2012-12-06 MED ORDER — FLUOXETINE HCL 10 MG PO CAPS
10.0000 mg | ORAL_CAPSULE | Freq: Every day | ORAL | Status: DC
  Administered 2012-12-06 – 2012-12-07 (×2): 10 mg via ORAL
  Filled 2012-12-06 (×5): qty 1

## 2012-12-06 MED ORDER — INSULIN ASPART 100 UNIT/ML ~~LOC~~ SOLN
0.0000 [IU] | Freq: Three times a day (TID) | SUBCUTANEOUS | Status: DC
  Administered 2012-12-07: 1 [IU] via SUBCUTANEOUS

## 2012-12-06 MED ORDER — FUROSEMIDE 10 MG/ML IJ SOLN
20.0000 mg | Freq: Two times a day (BID) | INTRAMUSCULAR | Status: DC
  Administered 2012-12-07: 20 mg via INTRAVENOUS
  Filled 2012-12-06: qty 2

## 2012-12-06 MED ORDER — SODIUM CHLORIDE 0.9 % IJ SOLN
3.0000 mL | Freq: Two times a day (BID) | INTRAMUSCULAR | Status: DC
  Administered 2012-12-06: 3 mL via INTRAVENOUS

## 2012-12-06 MED ORDER — ONDANSETRON HCL 4 MG/2ML IJ SOLN
4.0000 mg | Freq: Four times a day (QID) | INTRAMUSCULAR | Status: DC | PRN
Start: 2012-12-06 — End: 2012-12-07

## 2012-12-06 MED ORDER — ACETAMINOPHEN 325 MG PO TABS: 650.0000 mg | ORAL_TABLET | ORAL | Status: DC | PRN

## 2012-12-06 MED ORDER — FLUOXETINE HCL 10 MG PO CAPS
ORAL_CAPSULE | ORAL | Status: AC
  Filled 2012-12-06: qty 1

## 2012-12-06 MED ORDER — FUROSEMIDE 10 MG/ML IJ SOLN
40.0000 mg | Freq: Once | INTRAMUSCULAR | Status: AC
  Administered 2012-12-06: 40 mg via INTRAVENOUS
  Filled 2012-12-06: qty 4

## 2012-12-06 MED ORDER — DILTIAZEM HCL 30 MG PO TABS
240.0000 mg | ORAL_TABLET | ORAL | Status: DC
  Filled 2012-12-06: qty 1

## 2012-12-06 MED ORDER — METOPROLOL TARTRATE 25 MG PO TABS: 25.0000 mg | ORAL_TABLET | Freq: Two times a day (BID) | ORAL | Status: DC

## 2012-12-06 MED ORDER — METOPROLOL TARTRATE 50 MG PO TABS
50.0000 mg | ORAL_TABLET | ORAL | Status: AC
  Administered 2012-12-06: 50 mg via ORAL
  Filled 2012-12-06: qty 1

## 2012-12-06 NOTE — ED Notes (Signed)
Pt becoming increasingly confused. Pt trying to eat telemetry wires, trying to get out of bed. Pt asking for ash tray. Pt lethargic.

## 2012-12-06 NOTE — ED Notes (Signed)
Verbal order received from Dr. Maryland Pink to stop cardizem gtt.

## 2012-12-06 NOTE — Assessment & Plan Note (Addendum)
Patient is acutely altered today for differentials include A. fib RVR versus narcotic induced as she is on Percocet versus possible uremia as we did increase her Lasix over the weekend. I did speak with her daughter-in-law who is listed as her emergency contact and she tells me she thinks she's been taking too much pain medication She will be sent to [facility] for further workup- transport via EMS

## 2012-12-06 NOTE — ED Notes (Signed)
Pt arrived to er by [facility] from Dr. Buelah Manis office for evaluation of elevated heart rate and altered mental status, on ems arrival at dr office pt a-fib rhythm with rate of 118, has answered all questions with ems appropriately , does admits to taking one percocet 7.5/325mg  this am at 06:30.  ems reports that Dr. Buelah Manis asked pt where she was at in front of them at dr office, pt winked at ems, smiled and reported that she was at [place]. Pt alert able answer all questions appropriately, admits to lower back pain, denies any other complaints.

## 2012-12-06 NOTE — Telephone Encounter (Signed)
Spoke with patient and her leg went down but she is concerned about what a lot of lasix will do to her kidneys. Advised she come in for appt

## 2012-12-06 NOTE — ED Notes (Signed)
Cardizem gtt increased to 7 cc/hr.

## 2012-12-06 NOTE — H&P (Signed)
[facility] History and Physical  Mary [name] [id number] DOB: Aug 21, 1951 DOA: 12/06/2012  Referring physician: Lurene Shadow, ER physician PCP: Vic Blackbird, MD  Specialists: None  Chief Complaint: Confusion  HPI: Mary [name] is a 62 y.o. female  With past medical history of atrial fibrillation and diastolic heart failure (although she does not know that she has this diagnosis) who presented to her primary care physician's office and was reportedly having some nonspecific confusion. Discussed with the patient, she stated that she been from very anxious as of late and is taking increased doses of her Xanax. When her primary care physician saw her noted her to be in mild elevated atrial fibrillation and and patient was sent over to the emergency room. The emergency room she was noted to be in mild rapid atrial fibrillation with a heart rate slightly greater than 100. The rest of her labs are unremarkable. I asked for a BNP after reviewing her previous records and found to be mildly elevated at 2262. Patient was started on Cardizem drip briefly as well as IV Lasix. Rest of her labs were unremarkable including normal kidney function, glucose and electrolytes plus white count.  Review of Systems: Patient seen down emergency room. She states she's somewhat better. She is somewhat drowsy. States that she's been taking increased dose of Xanax for feelings of anxiety. Denies any headaches, vision changes, chest pain, palpitations, shortness of breath, wheeze, cough, abdominal pain, hematuria, dysuria, constipation, diarrhea, focal extremity numbness or weakness or pain. Review systems otherwise negative.  Past Medical History  Diagnosis Date  . Diabetes mellitus   . Arthritis   . Anxiety   . Chronic pain     Bacl pain, Disc L5-S1- Dr. Joya Salm in [place]  . Hyperlipidemia   . Hypertension   . Tachycardia   . Bronchial asthma   . Atrial fibrillation   . COPD (chronic obstructive pulmonary  disease)   . DDD (degenerative disc disease)     Radicular symptoms  . Shortness of breath   . CHF (congestive heart failure)    Past Surgical History  Procedure Laterality Date  . Carpal tunnel release      x 2  . Breast cyst removed    . Abdominal hysterectomy      partial  . Tubal ligation    . Tonsillectomy     Social History:  reports that she has been smoking Cigarettes.  She has been smoking about 0.25 packs per day. She does not have any smokeless tobacco history on file. She reports that she does not drink alcohol or use illicit drugs. Patient was at home by herself, but her sister stays with her some nights of the week. In addition she ambulates using a walker or cane and participates in most activities of daily living   Allergies  Allergen Reactions  . Adhesive (Tape) Other (See Comments)    Tears skin off.   . Latex Other (See Comments)    In tape, tears skin off.  . Zocor (Simvastatin - High Dose) Nausea And Vomiting    Family History  Problem Relation Age of Onset  . Diabetes Mother   . Heart disease Mother   . Diabetes Sister   . Hypertension Sister   . Hyperlipidemia Sister   . Diabetes Brother   . Heart disease Brother   . Diabetes Brother   . Heart disease Brother     Prior to Admission medications   Medication Sig Start Date End Date  Taking? Authorizing Provider  albuterol (PROVENTIL HFA;VENTOLIN HFA) 108 (90 BASE) MCG/ACT inhaler Inhale 2 puffs into the lungs every 4 (four) hours as needed. Shortness of Breath 09/19/12  Yes Alycia Rossetti, MD  ALPRAZolam Duanne Moron) 1 MG tablet Take 1 tablet (1 mg total) by mouth 3 (three) times daily as needed. 11/10/12  Yes Alycia Rossetti, MD  aspirin 81 MG tablet Take 81 mg by mouth daily.     Yes Historical Provider, MD  budesonide-formoterol (SYMBICORT) 160-4.5 MCG/ACT inhaler Inhale 2 puffs into the lungs 2 (two) times daily. 11/24/12 11/24/13 Yes Alycia Rossetti, MD  calcium gluconate 500 MG tablet Take 500 mg  by mouth 2 (two) times daily.     Yes Historical Provider, MD  dabigatran (PRADAXA) 150 MG CAPS Take 150 mg by mouth 2 (two) times daily.     Yes Historical Provider, MD  diltiazem (TIAZAC) 240 MG 24 hr capsule Take 240 mg by mouth daily.   Yes Historical Provider, MD  FLUoxetine (PROZAC) 10 MG capsule Take 10 mg by mouth daily.   Yes Historical Provider, MD  furosemide (LASIX) 20 MG tablet Take 20 mg by mouth as needed. Swelling 12/30/11  Yes Alycia Rossetti, MD  Liniments [facility] PAIN RELIEF PATCH) PADS Apply 1 each topically daily as needed. Pain   Yes Historical Provider, MD  metFORMIN (GLUCOPHAGE) 500 MG tablet Take 500 mg by mouth 2 (two) times daily.   Yes Historical Provider, MD  metoprolol tartrate (LOPRESSOR) 25 MG tablet Take 25-50 mg by mouth 2 (two) times daily. Takes 50mg  in the morning and 25mg  in the evening.   Yes Historical Provider, MD  Multiple Vitamin (MULTIVITAMIN WITH MINERALS) TABS Take 1 tablet by mouth daily.   Yes Historical Provider, MD  nystatin (MYCOSTATIN) 100000 UNIT/ML suspension Take 5 mLs (500,000 Units total) by mouth 4 (four) times daily. 11/28/12  Yes Alycia Rossetti, MD  oxyCODONE-acetaminophen (PERCOCET) 7.5-325 MG per tablet Take 1 tablet by mouth 4 (four) times daily as needed for pain. 11/10/12 11/10/13 Yes Alycia Rossetti, MD  pravastatin (PRAVACHOL) 80 MG tablet Take 80 mg by mouth at bedtime.     Yes Historical Provider, MD  promethazine (PHENERGAN) 12.5 MG tablet Take 1 tablet (12.5 mg total) by mouth every 6 (six) hours as needed for nausea. 08/26/12  Yes Alycia Rossetti, MD  sodium chloride (OCEAN) 0.65 % nasal spray Place 1 spray into the nose as needed. Congestion   Yes Historical Provider, MD   Physical Exam: Filed Vitals:   12/06/12 1329 12/06/12 1600 12/06/12 1634 12/06/12 1703  BP: 133/104 94/82  140/74  Pulse: 130 101 104 99  Temp:   97.1 F (36.2 C)   TempSrc:   Oral   Resp: 18 26 16    SpO2: 98% 93% 93%      General:  Alert and  oriented, although slightly drowsy, no acute distress  Eyes: Sclera nonicteric, extraocular movements are intact  ENT: Normocephalic, atraumatic, mucous membranes are moist  Neck: Supple no JVD  Cardiovascular: Irregular rhythm, rate controlled  Respiratory: Decreased breath sounds throughout  Abdomen: Soft, nontender, nondistended, positive bowel sounds  Skin: No skin breaks, tears or lesions  Musculoskeletal: No clubbing or cyanosis or edema  Psychiatric: Patient slightly drowsy,  Neurologic: No focal deficits  Labs on Admission:  Basic Metabolic Panel:  Recent Labs Lab 12/06/12 1340  NA 142  K 3.8  CL 101  CO2 30  GLUCOSE 154*  BUN 6  CREATININE  0.47*  CALCIUM 10.5   Liver Function Tests:  Recent Labs Lab 12/06/12 1340  AST 13  ALT 8  ALKPHOS 55  BILITOT 0.4  PROT 8.5*  ALBUMIN 4.7    Recent Labs Lab 12/06/12 1340  LIPASE 23   CBC:  Recent Labs Lab 12/06/12 1340  WBC 9.7  NEUTROABS 7.2  HGB 12.4  HCT 37.7  MCV 92.2  PLT 234   Cardiac Enzymes:  Recent Labs Lab 12/06/12 1340  TROPONINI <0.30    BNP (last 3 results)  Recent Labs  09/10/12 1919 09/13/12 0510 12/06/12 1340  PROBNP 4158.0* 1402.0* 2262.0*    Radiological Exams on Admission: Dg Chest 2 View  12/06/2012   IMPRESSION: No active cardiopulmonary disease.  Stable chest.   Original Report Authenticated By: Rolla Flatten, M.D.    Ct Head Wo Contrast  12/06/2012   IMPRESSION: Motion degraded exam.  No acute intracranial findings.  Cannot exclude Chiari I malformation.  See comments above.   Original Report Authenticated By: Rolla Flatten, M.D.     EKG: Independently reviewed. Atrial fibrillation with rapid ventricular rate  Assessment/Plan Principal Problem:   Diastolic congestive heart failure: Problem. Echocardiogram done in November of 2013 minutes grade 1 diastolic dysfunction confirmed by elevated BNP today. This is what is causing her heart rate to stay elevated.  We'll diurese and educate. Patient is not well informed and actually does not know that she has this. Active Problems:   Diabetes mellitus: Discontinue metformin because of heart failure issues. Plan to discharge her glipizide. Meantime cover with sliding scale   Hyperlipidemia   COPD (chronic obstructive pulmonary disease): Currently stable   Anxiety: Suspect increased anxiety "felt"with increased heart rate from atrial fibrillation.   Depression   Tobacco use: Declined nicotine patch   Atrial fibrillation: Already on Pradaxa. Suspect increased heart rate is from underlying volume overload and should improve nicely with diuresis. Already better without having to continue Cardizem drip    Altered mental status: Slightly odd behavior I suspect is from taking additional doses of Xanax because of increasing anxiety. See above    Code Status: Full code  Family Communication:  Family present earlier. None now. We'll discuss tomorrow  Disposition Plan: Could go home as early as tomorrow depending on heart rate and diuresis Time spent: 25 minutes  [facility] Pager [phone]  If 7PM-7AM, please contact night-coverage [email or web address] Password [facility] 12/06/2012, 5:42 PM

## 2012-12-06 NOTE — Progress Notes (Signed)
  Subjective:    Patient ID: Mary [name], female    DOB: [birth year], 62 y.o.   MRN: [id number]  HPI   Patient presented with complaint of worsening pain and feeling off balance. Over the weekend she had significant leg swelling and her Lasix was increased to 20 mg twice a day for 2 days. She began to then ramble about death of her young child bitten by a snake in death of her nephew a few weeks ago then she thought she was in the hospital and did not know who I was over the nurse was. She states she has been short of breath but denied any chest pain and also stated her left ear was hurting.    Review of Systems   GEN-+ fatigue, fever, weight loss,weakness, recent illness HEENT- denies eye drainage, change in vision, nasal discharge, + ear pain  CVS- denies chest pain, palpitations RESP- + SOB, cough, wheeze ABD- denies N/V, change in stools, abd pain GU- denies dysuria, hematuria, dribbling, incontinence MSK- +joint pain, muscle aches, injury Neuro- denies headache, dizziness, syncope, seizure activity      Objective:   Physical Exam GEN- NAD alert and oriented x 1 HEENT- PERRL, EOMI, oropharynx,clear MMM, nares clear, TM clear bilat  Neck- supple,  CVS- Irregular, Irregular rhythm,Rate 120-130  RSP-CTAB  ABD-NABS,soft,NT,ND EXT- no edema Pulse- radial and DP 2+  Neuro- confused, oriented to person only, did  not know year, season, my name or nurse , no hallucinations  EKG- A fib, RVR, HR 128      Assessment & Plan:

## 2012-12-06 NOTE — Patient Instructions (Addendum)
EMS to transport to ER for AMS

## 2012-12-06 NOTE — Assessment & Plan Note (Signed)
[facility]

## 2012-12-06 NOTE — ED Provider Notes (Signed)
History     CSN: [id number]  Arrival date & time 12/06/12  1204   First MD Initiated Contact with Patient 12/06/12 1216      Chief Complaint  Patient presents with  . Tachycardia  . Altered Mental Status     Patient is a 62 y.o. female presenting with altered mental status. The history is provided by the EMS personnel and the patient (Pt's PMD). The history is limited by the condition of the patient (AMS).  Altered Mental Status  Pt was seen at 1300.  Per pt's PMD, EMS and pt report, pt with AMS in pt's PMD's office this morning PTA.  Pt came into her office c/o rapid HR, was noted to be confused to time, place, events.  Apparently did not recognize office staff.  On EMS arrival to scene, pt was confused in the office, but became A&O when placed in the ambulance for transport to the ED.  On arrival to ED, pt A&O, cooperative and calm, c/o "fast HR."  Denies CP/SOB, no abd pain, no N/V/D, no back pain, no headache, no focal motor weakness, no tinging/numbness in extremities.    Past Medical History  Diagnosis Date  . Diabetes mellitus   . Arthritis   . Anxiety   . Chronic pain     Bacl pain, Disc L5-S1- Dr. Joya Salm in [place]  . Hyperlipidemia   . Hypertension   . Tachycardia   . Bronchial asthma   . Atrial fibrillation   . COPD (chronic obstructive pulmonary disease)   . DDD (degenerative disc disease)     Radicular symptoms  . Shortness of breath   . CHF (congestive heart failure)     Past Surgical History  Procedure Laterality Date  . Carpal tunnel release      x 2  . Breast cyst removed    . Abdominal hysterectomy      partial  . Tubal ligation    . Tonsillectomy      Family History  Problem Relation Age of Onset  . Diabetes Mother   . Heart disease Mother   . Diabetes Sister   . Hypertension Sister   . Hyperlipidemia Sister   . Diabetes Brother   . Heart disease Brother   . Diabetes Brother   . Heart disease Brother     History  Substance Use Topics  .  Smoking status: Current Every Day Smoker -- 0.25 packs/day    Types: Cigarettes  . Smokeless tobacco: Not on file  . Alcohol Use: No    Review of Systems  Unable to perform ROS: Mental status change  Psychiatric/Behavioral: Positive for altered mental status.        Allergies  Adhesive; Latex; and Zocor  Home Medications   Current Outpatient Rx  Name  Route  Sig  Dispense  Refill  . albuterol (PROVENTIL HFA;VENTOLIN HFA) 108 (90 BASE) MCG/ACT inhaler   Inhalation   Inhale 2 puffs into the lungs every 4 (four) hours as needed. Shortness of Breath   1 Inhaler   6   . ALPRAZolam (XANAX) 1 MG tablet   Oral   Take 1 tablet (1 mg total) by mouth 3 (three) times daily as needed.   90 tablet   0   . aspirin 81 MG tablet   Oral   Take 81 mg by mouth daily.           . budesonide-formoterol (SYMBICORT) 160-4.5 MCG/ACT inhaler   Inhalation   Inhale 2 puffs  into the lungs 2 (two) times daily.   1 Inhaler   3   . calcium gluconate 500 MG tablet   Oral   Take 500 mg by mouth 2 (two) times daily.           . dabigatran (PRADAXA) 150 MG CAPS   Oral   Take 150 mg by mouth 2 (two) times daily.           Marland Kitchen diltiazem (TIAZAC) 240 MG 24 hr capsule   Oral   Take 240 mg by mouth daily.         Marland Kitchen FLUoxetine (PROZAC) 10 MG capsule   Oral   Take 10 mg by mouth daily.         . furosemide (LASIX) 20 MG tablet   Oral   Take 20 mg by mouth as needed. Swelling         . Liniments (SALONPAS PAIN RELIEF PATCH) PADS   Apply externally   Apply 1 each topically daily as needed. Pain         . metFORMIN (GLUCOPHAGE) 1000 MG tablet      TAKE ONE TABLET BY MOUTH TWICE A DAY WITH MEALS   60 tablet   2   . metFORMIN (GLUCOPHAGE) 500 MG tablet   Oral   Take 500 mg by mouth 2 (two) times daily.         . metoprolol tartrate (LOPRESSOR) 25 MG tablet   Oral   Take 25-50 mg by mouth 2 (two) times daily. Takes 50mg  in the morning and 25mg  in the evening.          . Multiple Vitamin (MULTIVITAMIN WITH MINERALS) TABS   Oral   Take 1 tablet by mouth daily.         Marland Kitchen nystatin (MYCOSTATIN) 100000 UNIT/ML suspension   Oral   Take 5 mLs (500,000 Units total) by mouth 4 (four) times daily.   60 mL   0   . oxyCODONE-acetaminophen (PERCOCET) 7.5-325 MG per tablet   Oral   Take 1 tablet by mouth 4 (four) times daily as needed for pain.   100 tablet   0   . pravastatin (PRAVACHOL) 80 MG tablet   Oral   Take 80 mg by mouth at bedtime.           . promethazine (PHENERGAN) 12.5 MG tablet   Oral   Take 1 tablet (12.5 mg total) by mouth every 6 (six) hours as needed for nausea.   30 tablet   0   . sodium chloride (OCEAN) 0.65 % nasal spray   Nasal   Place 1 spray into the nose as needed. Congestion           BP 133/104  Pulse 130  Temp(Src) 97.7 F (36.5 C) (Oral)  Resp 18  SpO2 98%  Physical Exam 1305: Physical examination:  Nursing notes reviewed; Vital signs and O2 SAT reviewed;  Constitutional: Well developed, Well nourished, Well hydrated, In no acute distress; Head:  Normocephalic, atraumatic; Eyes: EOMI, PERRL, No scleral icterus; ENMT: Mouth and pharynx normal, Mucous membranes moist; Neck: Supple, Full range of motion, No lymphadenopathy; Cardiovascular: Irregular irregular rate and rhythm, +tachycardia. No gallop; Respiratory: Breath sounds clear & equal bilaterally, No wheezes.  Speaking full sentences with ease, Normal respiratory effort/excursion; Chest: Nontender, Movement normal; Abdomen: Soft, Nontender, Nondistended, Normal bowel sounds; Genitourinary: No CVA tenderness; Extremities: Pulses normal, No tenderness, No edema, No calf edema or asymmetry.; Neuro: Awake, alert, vague  historian regarding events today, Major CN grossly intact.  Speech clear. No facial droop. Grips equal, strength 5/5 equal bilat UE's and LE's. No apparent gross focal motor deficits in extremities.; Skin: Color normal, Warm, Dry.   ED Course   Procedures   [id number]:  EMS noted pt to be A&O during transport to the ED, but then became confused on arrival to ED, attempting to eat the monitor wires per ED RN and Tech. IV narcan given with good effect.  HPI given to me by pt, though vague historian about events PTA in her PMD's ofc; denies she "takes drugs" and endorses she "takes all my heart medicines."  Monitor with afib with RVR, rate 130-140's.  Will start IV cardizem bolus and gtt.    1600:  Pt continues intermittently confused with family, but then reorients quickly when an ED staff member walks into the room.  No focal neuro deficits, no facial droop.  Doubt CVA/TIA and pt is already on pradaxa.  Family very concerned regarding "her drug abuse," stating to ED RN and myself that pt often takes "more of her narcotic pain medicines than she should" and "steals ours."  Pt denies this to me, stating that she "doesn't abuse drugs."  UDS continues pending.  Regardless, pt is not medically cleared for psych eval, needs medical admission for afib with RVR.  No need for more narcan, as pt is awake and alert with me, asked me very clearly "are you going to admit me to the hospital?" Then after I walked out of the room and pt's family went in, they told ED RN and myself that she "was talking out of her head" "about birds or something."  Question behavioral component to AMS.   1630:  Pt's HR 90-100's now, improved from 130-140's.  T/C to [facility]. [place] Pink, case discussed, including:  HPI, pertinent PM/SHx, VS/PE, dx testing, ED course and treatment:  Agreeable to observation admit, requests to write temporary orders, obtain tele bed to team 2.   MDM  MDM Reviewed: previous chart, nursing note and vitals Reviewed previous: ECG Interpretation: ECG, labs, x-ray and CT scan Total time providing critical care: 30-74 minutes. This excludes time spent performing separately reportable procedures and services. Consults: admitting MD   CRITICAL  CARE Performed by: Alfonzo Feller Total critical care time: 35 Critical care time was exclusive of separately billable procedures and treating other patients. Critical care was necessary to treat or prevent imminent or life-threatening deterioration. Critical care was time spent personally by me on the following activities: development of treatment plan with patient and/or surrogate as well as nursing, discussions with consultants, evaluation of patient's response to treatment, examination of patient, obtaining history from patient or surrogate, ordering and performing treatments and interventions, ordering and review of laboratory studies, ordering and review of radiographic studies, pulse oximetry and re-evaluation of patient's condition.    Date: 12/06/2012  Rate: 118  Rhythm: atrial fibrillation  QRS Axis: left  Intervals: normal  ST/T Wave abnormalities: nonspecific ST/T changes  Conduction Disutrbances:none  Narrative Interpretation:   Old EKG Reviewed: unchanged; no significant changes from previous EKG dated 09/10/2012.  Results for orders placed during the hospital encounter of 12/06/12  ETHANOL      Result Value Range   Alcohol, Ethyl (B) <11  0 - 11 mg/dL  CBC WITH DIFFERENTIAL      Result Value Range   WBC 9.7  4.0 - 10.5 K/uL   RBC 4.09  3.87 - 5.11 MIL/uL  Hemoglobin 12.4  12.0 - 15.0 g/dL   HCT 37.7  36.0 - 46.0 %   MCV 92.2  78.0 - 100.0 fL   MCH 30.3  26.0 - 34.0 pg   MCHC 32.9  30.0 - 36.0 g/dL   RDW 15.3  11.5 - 15.5 %   Platelets 234  150 - 400 K/uL   Neutrophils Relative 75  43 - 77 %   Neutro Abs 7.2  1.7 - 7.7 K/uL   Lymphocytes Relative 20  12 - 46 %   Lymphs Abs 1.9  0.7 - 4.0 K/uL   Monocytes Relative 5  3 - 12 %   Monocytes Absolute 0.5  0.1 - 1.0 K/uL   Eosinophils Relative 1  0 - 5 %   Eosinophils Absolute 0.1  0.0 - 0.7 K/uL   Basophils Relative 0  0 - 1 %   Basophils Absolute 0.0  0.0 - 0.1 K/uL  COMPREHENSIVE METABOLIC PANEL      Result  Value Range   Sodium 142  135 - 145 mEq/L   Potassium 3.8  3.5 - 5.1 mEq/L   Chloride 101  96 - 112 mEq/L   CO2 30  19 - 32 mEq/L   Glucose, Bld 154 (*) 70 - 99 mg/dL   BUN 6  6 - 23 mg/dL   Creatinine, Ser 0.47 (*) 0.50 - 1.10 mg/dL   Calcium 10.5  8.4 - 10.5 mg/dL   Total Protein 8.5 (*) 6.0 - 8.3 g/dL   Albumin 4.7  3.5 - 5.2 g/dL   AST 13  0 - 37 U/L   ALT 8  0 - 35 U/L   Alkaline Phosphatase 55  39 - 117 U/L   Total Bilirubin 0.4  0.3 - 1.2 mg/dL   GFR calc non Af Amer >90  >90 mL/min   GFR calc Af Amer >90  >90 mL/min  LIPASE, BLOOD      Result Value Range   Lipase 23  11 - 59 U/L  TROPONIN I      Result Value Range   Troponin I <0.30  <0.30 ng/mL   Dg Chest 2 View 12/06/2012  *RADIOLOGY REPORT*  Clinical Data: Cough, cough, evaluate for infiltrate, history of CHF.  CHEST - 2 VIEW  Comparison: 09/11/2012.  Findings: Normal cardiac and mediastinal silhouette.  Clear lung fields.  No bony abnormality.  No effusion or pneumothorax.  Slight calcification and tortuosity of the aorta.  IMPRESSION: No active cardiopulmonary disease.  Stable chest.   Original Report Authenticated By: Rolla Flatten, M.D.    Ct Head Wo Contrast 12/06/2012  *RADIOLOGY REPORT*  Clinical Data: Tachycardia, altered mental status, increased confusion.  CT HEAD WITHOUT CONTRAST  Technique:  Contiguous axial images were obtained from the base of the skull through the vertex without contrast.  Comparison: 01/26/2004  Findings: The patient had difficulty remaining motionless for the study.  Images are suboptimal.  Small or subtle lesions could be overlooked.  There is no evidence for acute infarction, intracranial hemorrhage, mass lesion, hydrocephalus, or extra-axial fluid.  There is no evidence for atrophy or white matter disease.  There appears to be slight inferior displacement the cerebellar tonsils at the level of the foramen magnum and Chiari I malformation is not excluded.  Elective MRI could be confirmatory.   Moderate vascular calcification noted in the carotid siphons and left vertebral. No acute sinus disease.  Mastoids clear.  Small collection of air within the right temporalis muscle region likely  iatrogenic as there is no underlying inflammatory process or visible laceration.  IMPRESSION: Motion degraded exam.  No acute intracranial findings.  Cannot exclude Chiari I malformation.  See comments above.   Original Report Authenticated By: Rolla Flatten, M.D.                Alfonzo Feller, DO 12/08/12 2251

## 2012-12-06 NOTE — ED Notes (Signed)
Pt in Radiology 

## 2012-12-07 DIAGNOSIS — F411 Generalized anxiety disorder: Secondary | ICD-10-CM

## 2012-12-07 LAB — GLUCOSE, CAPILLARY
Glucose-Capillary: 184 mg/dL — ABNORMAL HIGH (ref 70–99)
Glucose-Capillary: 172 mg/dL — ABNORMAL HIGH (ref 70–99)

## 2012-12-07 LAB — BASIC METABOLIC PANEL
CO2: 28 mEq/L (ref 19–32)
Glucose, Bld: 165 mg/dL — ABNORMAL HIGH (ref 70–99)
Potassium: 3.7 mEq/L (ref 3.5–5.1)
Sodium: 141 mEq/L (ref 135–145)

## 2012-12-07 LAB — HEMOGLOBIN A1C
Hgb A1c MFr Bld: 7 % — ABNORMAL HIGH (ref <5.7)
Mean Plasma Glucose: 154 mg/dL — ABNORMAL HIGH (ref <117)

## 2012-12-07 MED ORDER — SITAGLIPTIN PHOSPHATE 100 MG PO TABS: 100.0000 mg | ORAL_TABLET | Freq: Every day | ORAL | Status: DC

## 2012-12-07 MED ORDER — FUROSEMIDE 20 MG PO TABS: 20.0000 mg | ORAL_TABLET | Freq: Every day | ORAL | Status: DC

## 2012-12-07 MED ORDER — CARVEDILOL 3.125 MG PO TABS: 6.2500 mg | ORAL_TABLET | ORAL | Status: DC

## 2012-12-07 MED ORDER — LISINOPRIL 2.5 MG PO TABS: 2.5000 mg | ORAL_TABLET | Freq: Every day | ORAL | Status: DC

## 2012-12-07 MED ORDER — DILTIAZEM HCL ER COATED BEADS 240 MG PO CP24
240.0000 mg | ORAL_CAPSULE | Freq: Every day | ORAL | Status: DC
  Administered 2012-12-07: 240 mg via ORAL
  Filled 2012-12-07: qty 1

## 2012-12-07 MED ORDER — GLIPIZIDE 5 MG PO TABS: 2.5000 mg | ORAL_TABLET | Freq: Two times a day (BID) | ORAL | Status: DC

## 2012-12-07 MED ORDER — CARVEDILOL 12.5 MG PO TABS: 12.5000 mg | ORAL_TABLET | Freq: Two times a day (BID) | ORAL | Status: DC

## 2012-12-07 MED ORDER — LIVING BETTER WITH HEART FAILURE BOOK
Freq: Once | Status: DC
  Filled 2012-12-07: qty 1

## 2012-12-07 NOTE — Care Management Note (Signed)
    Page 1 of 1   12/07/2012     2:27:35 PM   CARE MANAGEMENT NOTE 12/07/2012  Patient:  Mary [name], Mary [name]   Account Number:  [id number]  Date Initiated:  12/07/2012  Documentation initiated by:  Claretha Cooper  Subjective/Objective Assessment:   Pt admitted with Dial. heart failure. [place] gome with [facility] RN and [name] referral to [facility].     Action/Plan:   Anticipated DC Date:  12/07/2012   Anticipated DC Plan:  [place]  CM consult      Choice offered to / List presented to:          [facility] arranged  HH-1 RN  [place].   Status of service:  Completed, signed off Medicare Important Message given?   (If response is "NO", the following Medicare IM given date fields will be blank) Date Medicare IM given:   Date Additional Medicare IM given:    Discharge Disposition:  [place]  Per UR Regulation:    If discussed at Long Length of Stay Meetings, dates discussed:    Comments:  12/07/12 Claretha Cooper RN BSN CM

## 2012-12-07 NOTE — Progress Notes (Signed)
UR Chart Review Completed  

## 2012-12-07 NOTE — Progress Notes (Signed)
D/c instructions reviewed with patient.  Verbalized understanding.  Pt dc'd to home with family. Schonewitz, Eulis Canner 12/07/2012

## 2012-12-07 NOTE — Discharge Summary (Signed)
Physician Discharge Summary  Mary [name] [id number] DOB: [birth year] DOA: 12/06/2012  PCP: Vic Blackbird, MD  Admit date: 12/06/2012 Discharge date: 12/07/2012  Time spent: 30 minutes  Recommendations for Outpatient Follow-up:  1. Patient will follow up with her primary care physician in the next few weeks. At that time, her PCP can determine if her heart rate is adequately controlled-changes made in her blood pressure medications 2. Patient is having both home health as well as [place] care managers come to her house for CHF assistance/education 3. Patient is going to have an appointment set up for cardiology for her CHF  Discharge Diagnoses:  Principal Problem:   Diastolic congestive heart failure Active Problems:   Diabetes mellitus   Hyperlipidemia   COPD (chronic obstructive pulmonary disease)   Anxiety   Depression   Tobacco use   Atrial fibrillation   Altered mental status   Discharge Condition: Improved, being discharged home  Diet recommendation: Heart healthy  Filed Weights   12/06/12 2007 12/07/12 0412  Weight: 60.4 kg (133 lb 2.5 oz) 62.3 kg (137 lb 5.6 oz)    History of present illness:  Mary [name] is a 62 y.o. Female w/ past medical history of atrial fibrillation and diastolic heart failure (although she does not know that she has this diagnosis) who presented to her primary care physician's office and was reportedly having some nonspecific confusion. Discussed with the patient, she stated that she been from very anxious as of late and was taking increased doses of her Xanax. She was also noted to be in mild elevated atrial fibrillation rate and and patient was sent over to the emergency room.. The rest of her labs are unremarkable. I asked for a BNP after reviewing her previous records and found to be mildly elevated at 2262. Patient was started on Cardizem drip briefly as well as IV Lasix.    Hospital Course:  Principal Problem:   Diastolic congestive  heart failure: Patient actually was diagnosed with diastolic heart failure in November of 2013. She states that she did not know she has this diagnosis. She is on when necessary daily Lasix plus metoprolol. We did diurese her in the hospital and she put out 1.5 L. This in part likely played will in her elevated heart rate. Have changed her metoprolol to Coreg plus low-dose lisinopril. We'll refer her to cardiology. In addition, for better education and support, have set up [facility] case management and a home health nurse to come see her for better education and support.  Active Problems:   Diabetes mellitus: A1c only mildly elevated at 7.0. However she is on metformin which I discontinued because of contraindications in association with congestive heart failure. Patient states that she is intolerant of glipizide so have started her on Januvia 100 daily    Hyperlipidemia: Stable    COPD (chronic obstructive pulmonary disease): Stable    Anxiety: See below    Depression: Stable    Tobacco use: Patient admits to occasionally still smoking.  Declined nicotine patch while in the hospital    Atrial fibrillation: As mentioned, patient was noted to be in mild rapid atrial fibrillation. Suspect that the increased tachycardia and palpitations were contributing to patient's feeling of anxiety. Patient already anticoagulated on Pradaxa and this was continued. She initially received IV Cardizem and we changed her over to her by mouth medications. She still had some borderline tachycardia and in review of her blood pressure and heart rate, felt she could be  better managed on Coreg (which would help her heart failure) so metoprolol was discontinued in place of Coreg (at slightly higher dose)    Altered mental status: Patient is a CT scan of the head done in the emergency room which was negative. He was less suspicious that she had a stroke from atrial fibrillation, given the fact that she is on Pradaxa. She also  appeared to be not completely delirious, but not 100% alert and oriented. After an extensive discussion, patient admitted to taking increased doses of her Xanax in the past few days prior to seeing her primary care physician because of an increased feeling of anxiety. See above. By limiting her Xanax in the hospital, patient return back to fully alert and oriented status by hospital day 2   Procedures:  Echocardiogram done 123XX123: Grade 1 diastolic dysfunction, moderate mitral regurg  Consultations:  None  Discharge Exam: Filed Vitals:   12/06/12 2047 12/06/12 2059 12/07/12 0412 12/07/12 0750  BP:  126/73 151/90   Pulse:  92 109   Temp:  98 F (36.7 C) 97.7 F (36.5 C)   TempSrc:  Oral Oral   Resp:  20 20   Height:      Weight:   62.3 kg (137 lb 5.6 oz)   SpO2: 92% 94% 93% 91%    General: Now fully alert and oriented x3, no acute distress Cardiovascular: Irregular rhythm, borderline tachycardia Respiratory: Clear to auscultation bilaterally Abdomen: Soft, nontender, nondistended, positive bowel sounds Extremities: No clubbing or cyanosis, trace pitting edema  Discharge Instructions  Discharge Orders   Future Appointments Provider Department Dept Phone   02/17/2013 11:00 AM Alycia Rossetti, MD [facility] [phone]   Future Orders Complete By Expires     Diet - low sodium heart healthy  As directed     Increase activity slowly  As directed         Medication List    STOP taking these medications       metFORMIN 1000 MG tablet  Commonly known as:  GLUCOPHAGE     metoprolol tartrate 25 MG tablet  Commonly known as:  LOPRESSOR      TAKE these medications       albuterol 108 (90 BASE) MCG/ACT inhaler  Commonly known as:  PROVENTIL HFA;VENTOLIN HFA  Inhale 2 puffs into the lungs every 4 (four) hours as needed. Shortness of Breath     ALPRAZolam 1 MG tablet  Commonly known as:  XANAX  Take 1 tablet (1 mg total) by mouth 3 (three) times daily as  needed.     aspirin 81 MG tablet  Take 81 mg by mouth daily.     budesonide-formoterol 160-4.5 MCG/ACT inhaler  Commonly known as:  SYMBICORT  Inhale 2 puffs into the lungs 2 (two) times daily.     calcium gluconate 500 MG tablet  Take 500 mg by mouth 2 (two) times daily.     carvedilol 12.5 MG tablet  Commonly known as:  COREG  Take 1 tablet (12.5 mg total) by mouth 2 (two) times daily with a meal.     diltiazem 240 MG 24 hr capsule  Commonly known as:  TIAZAC  Take 240 mg by mouth daily.     FLUoxetine 10 MG capsule  Commonly known as:  PROZAC  Take 10 mg by mouth daily.     furosemide 20 MG tablet  Commonly known as:  LASIX  Take 1 tablet (20 mg total) by mouth daily. Swelling  lisinopril 2.5 MG tablet  Commonly known as:  PRINIVIL  Take 1 tablet (2.5 mg total) by mouth daily.     multivitamin with minerals Tabs  Take 1 tablet by mouth daily.     nystatin 100000 UNIT/ML suspension  Commonly known as:  MYCOSTATIN  Take 5 mLs (500,000 Units total) by mouth 4 (four) times daily.     oxyCODONE-acetaminophen 7.5-325 MG per tablet  Commonly known as:  PERCOCET  Take 1 tablet by mouth 4 (four) times daily as needed for pain.     PRADAXA 150 MG Caps  Generic drug:  dabigatran  Take 150 mg by mouth 2 (two) times daily.     pravastatin 80 MG tablet  Commonly known as:  PRAVACHOL  Take 80 mg by mouth at bedtime.     promethazine 12.5 MG tablet  Commonly known as:  PHENERGAN  Take 1 tablet (12.5 mg total) by mouth every 6 (six) hours as needed for nausea.     SALONPAS PAIN RELIEF PATCH Pads  Apply 1 each topically daily as needed. Pain     sitaGLIPtin 100 MG tablet  Commonly known as:  JANUVIA  Take 1 tablet (100 mg total) by mouth daily.     sodium chloride 0.65 % nasal spray  Commonly known as:  OCEAN  Place 1 spray into the nose as needed. Congestion           Follow-up Information   Follow up with Vic Blackbird, MD In 2 weeks.   Contact  information:   [address], [place] [address] [phone]        The results of significant diagnostics from this hospitalization (including imaging, microbiology, ancillary and laboratory) are listed below for reference.    Significant Diagnostic Studies: Dg Chest 2 View  12/06/2012    IMPRESSION: No active cardiopulmonary disease.  Stable chest.   Original Report Authenticated By: Rolla Flatten, M.D.    Ct Head Wo Contrast  12/06/2012    IMPRESSION: Motion degraded exam.  No acute intracranial findings.  Cannot exclude Chiari I malformation.  See comments above.   Original Report Authenticated By: Rolla Flatten, M.D.       Labs: Basic Metabolic Panel:  Recent Labs Lab 12/06/12 1340 12/07/12 0603  NA 142 141  K 3.8 3.7  CL 101 102  CO2 30 28  GLUCOSE 154* 165*  BUN 6 7  CREATININE 0.47* 0.44*  CALCIUM 10.5 9.9   Liver Function Tests:  Recent Labs Lab 12/06/12 1340  AST 13  ALT 8  ALKPHOS 55  BILITOT 0.4  PROT 8.5*  ALBUMIN 4.7    Recent Labs Lab 12/06/12 1340  LIPASE 23   CBC:  Recent Labs Lab 12/06/12 1340  WBC 9.7  NEUTROABS 7.2  HGB 12.4  HCT 37.7  MCV 92.2  PLT 234   Cardiac Enzymes:  Recent Labs Lab 12/06/12 1340  TROPONINI <0.30   BNP: BNP (last 3 results)  Recent Labs  09/10/12 1919 09/13/12 0510 12/06/12 1340  PROBNP 4158.0* 1402.0* 2262.0*   CBG:  Recent Labs Lab 12/07/12 0741  GLUCAP 146*       Signed:  Saleema Weppler K  [facility] 12/07/2012, 11:39 AM

## 2012-12-09 ENCOUNTER — Telehealth: Payer: Self-pay | Admitting: Family Medicine

## 2012-12-12 ENCOUNTER — Telehealth: Payer: Self-pay | Admitting: Family Medicine

## 2012-12-12 MED ORDER — OXYCODONE-ACETAMINOPHEN 7.5-325 MG PO TABS: 1.0000 | ORAL_TABLET | Freq: Four times a day (QID) | ORAL | Status: DC | PRN

## 2012-12-12 MED ORDER — PROMETHAZINE HCL 12.5 MG PO TABS: 12.5000 mg | ORAL_TABLET | Freq: Four times a day (QID) | ORAL | Status: DC | PRN

## 2012-12-12 MED ORDER — CARVEDILOL 12.5 MG PO TABS: 12.5000 mg | ORAL_TABLET | Freq: Two times a day (BID) | ORAL | Status: DC

## 2012-12-12 MED ORDER — ALPRAZOLAM 1 MG PO TABS: 1.0000 mg | ORAL_TABLET | Freq: Three times a day (TID) | ORAL | Status: DC | PRN

## 2012-12-12 MED ORDER — SITAGLIPTIN PHOSPHATE 100 MG PO TABS: 100.0000 mg | ORAL_TABLET | Freq: Every day | ORAL | Status: DC

## 2012-12-12 NOTE — Telephone Encounter (Signed)
Spoke with patient states she has been sick on the stomach and nauseous, states she was running a fever in the hospital but this was not documented. She has some new medication changes from the hospital. She was taken off of metformin to 2 diastolic dysfunction. She was started on Januvia and glipizide however she has not been able to tolerate beside the past she did take a couple of doses of this medication has been sick. Her blood sugar was 170 this morning she has not taken the Januvia. I advised her to discontinue the glipizide intake Januvia one tablet daily starting tomorrow. She will also take Coreg and lisinopril. Phenergan refilled  She states she has not been abusing her medication as her sister in law told me, I advised her she told inpatient physician that she took a few extra xanax but denied this. She is out of her chronic pain meds and due for refill Discussed compliance with medications, she has been complaint with pain contract this far Will allow medication refill

## 2012-12-12 NOTE — Telephone Encounter (Signed)
meds collected

## 2012-12-13 NOTE — Telephone Encounter (Signed)
Patient collected  

## 2012-12-22 ENCOUNTER — Ambulatory Visit: Payer: Medicare Other | Admitting: Family Medicine

## 2012-12-26 ENCOUNTER — Encounter: Payer: Self-pay | Admitting: Family Medicine

## 2012-12-26 ENCOUNTER — Ambulatory Visit (INDEPENDENT_AMBULATORY_CARE_PROVIDER_SITE_OTHER): Payer: Medicare Other | Admitting: Family Medicine

## 2012-12-26 VITALS — BP 118/68 | HR 84 | Resp 18 | Ht 67.0 in | Wt 140.0 lb

## 2012-12-26 DIAGNOSIS — I5030 Unspecified diastolic (congestive) heart failure: Secondary | ICD-10-CM

## 2012-12-26 DIAGNOSIS — F411 Generalized anxiety disorder: Secondary | ICD-10-CM

## 2012-12-26 DIAGNOSIS — E119 Type 2 diabetes mellitus without complications: Secondary | ICD-10-CM

## 2012-12-26 DIAGNOSIS — Z720 Tobacco use: Secondary | ICD-10-CM

## 2012-12-26 DIAGNOSIS — I509 Heart failure, unspecified: Secondary | ICD-10-CM

## 2012-12-26 DIAGNOSIS — Z1211 Encounter for screening for malignant neoplasm of colon: Secondary | ICD-10-CM

## 2012-12-26 DIAGNOSIS — I4891 Unspecified atrial fibrillation: Secondary | ICD-10-CM

## 2012-12-26 DIAGNOSIS — F419 Anxiety disorder, unspecified: Secondary | ICD-10-CM

## 2012-12-26 DIAGNOSIS — F17200 Nicotine dependence, unspecified, uncomplicated: Secondary | ICD-10-CM

## 2012-12-26 DIAGNOSIS — G8929 Other chronic pain: Secondary | ICD-10-CM

## 2012-12-26 NOTE — Assessment & Plan Note (Signed)
Her rate is now well controlled. She's on diltiazem as well as Coreg I will have her schedule followup with her cardiologist no current chest pain

## 2012-12-26 NOTE — Assessment & Plan Note (Signed)
Unchanged ,continues to smoke

## 2012-12-26 NOTE — Assessment & Plan Note (Signed)
We discussed the importance of taking her medication as prescribed she is a widow thigh have any other episodes where she's not taking her medicine and only get any other concerns or complaints and then  I will discontinue her Xanax as well as her pain medication

## 2012-12-26 NOTE — Assessment & Plan Note (Signed)
We discussed the importance of taking her medication as prescribed she is on a pain contract. We will continue her current medication at this time

## 2012-12-26 NOTE — Patient Instructions (Addendum)
Continue januvia Continue current heart meds Take the lasix once a day  Call and schedule an appointment with Dr. Claiborne Billings for your heart Keep previous follow-up appointment

## 2012-12-26 NOTE — Assessment & Plan Note (Signed)
She has stage I diastolic dysfunction she will continue the Lasix daily instead of as needed as previous until she is seen by cardiology

## 2012-12-26 NOTE — Progress Notes (Signed)
  Subjective:    Patient ID: Mary [name], female    DOB: [birth year], 62 y.o.   MRN: [id number]  HPI  Patient presents for hospital followup. She was sent to the ER from our office after altered mental status as well as A. fib with RVR. She does admit to taking an extra pain pill because she had been sick before she came to my office. She denies any suicidal ideations states she has had some stress with her family members. She states she is much improved and is back at her baseline. Her blood sugars have been less than 150 fasting she is doing well on Januvia. She's been taking Lasix daily she still occasionally gets mild leg swelling. She's not had any chest pain or shortness of breath out of the ordinary  Review of Systems   GEN- denies fatigue, fever, weight loss,weakness, recent illness HEENT- denies eye drainage, change in vision, nasal discharge, CVS- denies chest pain, palpitations RESP- + dyspnea on exertion, cough, wheeze ABD- denies N/V, change in stools, abd pain GU- denies dysuria, hematuria, dribbling, incontinence MSK- + joint pain, muscle aches, injury Neuro- denies headache, dizziness, syncope, seizure activity      Objective:   Physical Exam  GEN- NAD alert and oriented x3 ,  HEENT- PERRL, EOMI, oropharynx clear, MMM, nares clear, TM clear bilat, no effusion,  Neck- supple, no LAD CVS- Irregular, Irregular rhythm, no murmur RSP-CTAB , decreased BS bases EXT- no edema Pulse- DP 2+, radial 2+ Psych-normal affect and mood        Assessment & Plan:

## 2012-12-26 NOTE — Assessment & Plan Note (Signed)
Blood sugars are doing well her A1c was 7%, continue Januvia

## 2013-01-02 ENCOUNTER — Other Ambulatory Visit: Payer: Self-pay | Admitting: Family Medicine

## 2013-01-04 ENCOUNTER — Encounter: Payer: Self-pay | Admitting: Family Medicine

## 2013-01-06 ENCOUNTER — Other Ambulatory Visit: Payer: Self-pay

## 2013-01-06 MED ORDER — ALPRAZOLAM 1 MG PO TABS: 1.0000 mg | ORAL_TABLET | Freq: Three times a day (TID) | ORAL | Status: DC | PRN

## 2013-01-06 MED ORDER — OXYCODONE-ACETAMINOPHEN 7.5-325 MG PO TABS: 1.0000 | ORAL_TABLET | Freq: Four times a day (QID) | ORAL | Status: DC | PRN

## 2013-01-11 LAB — HEMOCCULT GUIAC POC 1CARD (OFFICE): Card #2 Fecal Occult Blod, POC: NEGATIVE

## 2013-01-11 NOTE — Addendum Note (Signed)
Addended by: Denman George B on: 01/11/2013 08:46 AM   Modules accepted: Orders

## 2013-02-08 ENCOUNTER — Telehealth: Payer: Self-pay | Admitting: Family Medicine

## 2013-02-08 MED ORDER — ALPRAZOLAM 1 MG PO TABS: 1.0000 mg | ORAL_TABLET | Freq: Three times a day (TID) | ORAL | Status: DC | PRN

## 2013-02-08 MED ORDER — OXYCODONE-ACETAMINOPHEN 7.5-325 MG PO TABS: 1.0000 | ORAL_TABLET | Freq: Four times a day (QID) | ORAL | Status: DC | PRN

## 2013-02-08 NOTE — Telephone Encounter (Signed)
Advised she can pick them up tomorrow

## 2013-02-08 NOTE — Telephone Encounter (Signed)
I have already printed this to be collected tomorrow but now she is wanting the xanax increased

## 2013-02-08 NOTE — Telephone Encounter (Signed)
Xanax can not be increased

## 2013-02-10 ENCOUNTER — Ambulatory Visit: Payer: Medicare Other | Admitting: Family Medicine

## 2013-02-17 ENCOUNTER — Ambulatory Visit (INDEPENDENT_AMBULATORY_CARE_PROVIDER_SITE_OTHER): Payer: Medicare Other | Admitting: Family Medicine

## 2013-02-17 ENCOUNTER — Encounter: Payer: Self-pay | Admitting: Family Medicine

## 2013-02-17 VITALS — BP 110/80 | HR 86 | Resp 16 | Wt 155.0 lb

## 2013-02-17 DIAGNOSIS — L97509 Non-pressure chronic ulcer of other part of unspecified foot with unspecified severity: Secondary | ICD-10-CM

## 2013-02-17 DIAGNOSIS — R609 Edema, unspecified: Secondary | ICD-10-CM

## 2013-02-17 DIAGNOSIS — I4891 Unspecified atrial fibrillation: Secondary | ICD-10-CM

## 2013-02-17 DIAGNOSIS — R600 Localized edema: Secondary | ICD-10-CM

## 2013-02-17 DIAGNOSIS — I5030 Unspecified diastolic (congestive) heart failure: Secondary | ICD-10-CM

## 2013-02-17 DIAGNOSIS — H9319 Tinnitus, unspecified ear: Secondary | ICD-10-CM

## 2013-02-17 DIAGNOSIS — I509 Heart failure, unspecified: Secondary | ICD-10-CM

## 2013-02-17 DIAGNOSIS — H9312 Tinnitus, left ear: Secondary | ICD-10-CM

## 2013-02-17 DIAGNOSIS — L97511 Non-pressure chronic ulcer of other part of right foot limited to breakdown of skin: Secondary | ICD-10-CM

## 2013-02-17 MED ORDER — POTASSIUM CHLORIDE ER 10 MEQ PO TBCR: 10.0000 meq | EXTENDED_RELEASE_TABLET | Freq: Every day | ORAL | Status: DC

## 2013-02-17 NOTE — Progress Notes (Signed)
  Subjective:    Patient ID: Mary [name], female    DOB: 1950-12-31, 62 y.o.   MRN: [id number]  HPI  Patient presents with worsening leg swelling over the past couple weeks. She's had some short of breath but no chest pain. She's been taking her Lasix as prescribed she did see her cardiologist a couple weeks ago at that time her leg swelling was down. She also complains of ringing in her left ear which has not improved, sometimes her hearing is muffled  Review of Systems  GEN- denies fatigue, fever, weight loss,weakness, recent illness HEENT- denies eye drainage, change in vision, nasal discharge, CVS- denies chest pain, palpitations, + leg swelling  RESP- + SOB, cough, wheeze ABD- denies N/V, change in stools, abd pain GU- denies dysuria, hematuria, dribbling, incontinence MSK- + joint pain, muscle aches, injury Neuro- denies headache, dizziness, syncope, seizure activity      Objective:   Physical Exam GEN- NAD alert and oriented x3 , + 15 pound weight gain since our last visit HEENT- PERRL, EOMI, oropharynx clear, MMM, nares clear, TM clear bilat, no effusion,  Neck- supple, no LAD CVS- Irregular, Irregular rhythm, no murmur RSP-CTAB , decreased BS bases, breathing comfortably ABD-NABS,soft,NO ascites,ND EXT- 1+ pitting edema R >l , Right foot- small opening in skin no drainage, no erythema  Pulse- DP 2+, radial 2+ Psych-normal affect and mood         Assessment & Plan:

## 2013-02-17 NOTE — Patient Instructions (Addendum)
Increase lasix to 40mg  daily Take with the potassium Use the bacitracin twice a day, on foot ulcer, keep clean Elevate foot Referral to ENT for ringing in ears F/U MOnday for recheck and labs

## 2013-02-19 ENCOUNTER — Telehealth: Payer: Self-pay | Admitting: Family Medicine

## 2013-02-19 DIAGNOSIS — R600 Localized edema: Secondary | ICD-10-CM | POA: Insufficient documentation

## 2013-02-19 DIAGNOSIS — L97509 Non-pressure chronic ulcer of other part of unspecified foot with unspecified severity: Secondary | ICD-10-CM | POA: Insufficient documentation

## 2013-02-19 DIAGNOSIS — H9312 Tinnitus, left ear: Secondary | ICD-10-CM | POA: Insufficient documentation

## 2013-02-19 NOTE — Assessment & Plan Note (Addendum)
Decompensated  with her increased edema, she also has a fib. I will double her lasix, she declined BID dosing, potassium to be added, recheck her on Monday She has no signs of distress, vitals are good, given red flags Her weight shows a 15lb weight gain, though she has marked edema on legs, she does not look like she has gained this amount of weight

## 2013-02-19 NOTE — Telephone Encounter (Signed)
Called pt twice ,to check on leg swelling and medications, no answer has appt in AM

## 2013-02-19 NOTE — Telephone Encounter (Signed)
Finally spoke to pt this afternoon, swelling has improved but still has edema Right leg, also has neuropathic symptoms in feet. Advised to take 1 extra 20mg  of lasix this afternoon with potassium, total of 60mg  for today, appt in AM, labs to be drawn

## 2013-02-19 NOTE — Assessment & Plan Note (Signed)
Referral ENT

## 2013-02-19 NOTE — Assessment & Plan Note (Signed)
Due to edema in feet, no signs of infection, no draninge, topical antibiotic, recheck Monday, will improve with decrease in edema

## 2013-02-19 NOTE — Assessment & Plan Note (Signed)
Per above, change in diuretics, elevation Noted weight gain from fluid

## 2013-02-19 NOTE — Assessment & Plan Note (Signed)
Rate controlled , at risk for RVR with her CHF

## 2013-02-20 ENCOUNTER — Encounter: Payer: Self-pay | Admitting: Family Medicine

## 2013-02-20 ENCOUNTER — Ambulatory Visit (INDEPENDENT_AMBULATORY_CARE_PROVIDER_SITE_OTHER): Payer: Medicare Other | Admitting: Family Medicine

## 2013-02-20 VITALS — BP 108/70 | HR 89 | Resp 16 | Wt 149.0 lb

## 2013-02-20 DIAGNOSIS — H9312 Tinnitus, left ear: Secondary | ICD-10-CM

## 2013-02-20 DIAGNOSIS — H9319 Tinnitus, unspecified ear: Secondary | ICD-10-CM

## 2013-02-20 DIAGNOSIS — R609 Edema, unspecified: Secondary | ICD-10-CM

## 2013-02-20 DIAGNOSIS — R600 Localized edema: Secondary | ICD-10-CM

## 2013-02-20 MED ORDER — PROMETHAZINE HCL 12.5 MG PO TABS: 12.5000 mg | ORAL_TABLET | Freq: Four times a day (QID) | ORAL | Status: DC | PRN

## 2013-02-20 MED ORDER — MECLIZINE HCL 12.5 MG PO TABS: 12.5000 mg | ORAL_TABLET | Freq: Three times a day (TID) | ORAL | Status: DC | PRN

## 2013-02-20 NOTE — Assessment & Plan Note (Signed)
Referral made, add meclizine as it causes some vertigo symptoms

## 2013-02-20 NOTE — Patient Instructions (Addendum)
Try the meclizine for dizziness Appt with ENT - in May Nausea medication refilled Get the labs done today for your kidneys and potassium Call [facility] for information about transportation Continue lasix 40mg  once a day  Call me with your weight on Wed with your weights F/U 2 months at [facility]

## 2013-02-20 NOTE — Assessment & Plan Note (Signed)
Improved with increased lasix, continue 40mg  daily with potassium, she will check weight at home and call office with results BMET today

## 2013-02-20 NOTE — Progress Notes (Signed)
  Subjective:    Patient ID: Mary [name], female    DOB: [birth year], 62 y.o.   MRN: [id number]  HPI  Patient here to followup interim visit for her edema her legs have vastly improved. She is taking 40 mg of Lasix a day last Friday had her take an extra 20 mg dose with potassium. Her shortness of breath has improved she has lost 6 pounds since her visit. She continues to complain of the ringing in her ears and dizzy spells she has an appointment with ear nose and throat in May  Review of Systems =- per above  GEN- denies fatigue, fever, weight loss,weakness, recent illness HEENT- denies eye drainage, change in vision, nasal discharge, CVS- denies chest pain, palpitations RESP- denies SOB, cough, wheeze Neuro- denies headache, dizziness, syncope, seizure activity       Objective:   Physical Exam  GEN- NAD alert and oriented x3 ,  CVS- RRR, no murmur RSP-CTAB , decreased BS bases, breathing comfortably ABD-NABS,soft,NO ascites,ND EXT- + pitting edema R >l - much improved Right foot- small opening in skin no drainage, no erythema  Pulse- DP 2+, radial 2+      Assessment & Plan:

## 2013-02-21 LAB — BASIC METABOLIC PANEL
Calcium: 9.8 mg/dL (ref 8.4–10.5)
Potassium: 4.8 mEq/L (ref 3.5–5.3)
Sodium: 140 mEq/L (ref 135–145)

## 2013-02-22 ENCOUNTER — Telehealth: Payer: Self-pay | Admitting: Family Medicine

## 2013-02-23 NOTE — Telephone Encounter (Signed)
Have her continue lasix 40mg  daily with potassium to keep the fluid down  You can send in new dose of lasix 40mg  daily

## 2013-02-24 ENCOUNTER — Other Ambulatory Visit: Payer: Self-pay

## 2013-02-24 MED ORDER — FUROSEMIDE 40 MG PO TABS: 40.0000 mg | ORAL_TABLET | Freq: Every day | ORAL | Status: DC

## 2013-02-24 NOTE — Telephone Encounter (Signed)
Called and notified patient and sent in new rx for lasix.  Patient would like to know is she will able to get script for narcotics before you change offices.

## 2013-02-26 NOTE — Telephone Encounter (Signed)
Yes, she can pick up her regular may script when due, looks like 5/15

## 2013-02-27 ENCOUNTER — Other Ambulatory Visit: Payer: Self-pay | Admitting: Family Medicine

## 2013-02-27 NOTE — Telephone Encounter (Signed)
Noted  

## 2013-02-27 NOTE — Telephone Encounter (Signed)
Patient aware.

## 2013-03-03 ENCOUNTER — Other Ambulatory Visit: Payer: Self-pay

## 2013-03-03 MED ORDER — ALPRAZOLAM 1 MG PO TABS: 1.0000 mg | ORAL_TABLET | Freq: Three times a day (TID) | ORAL | Status: DC | PRN

## 2013-03-03 MED ORDER — OXYCODONE-ACETAMINOPHEN 7.5-325 MG PO TABS: 1.0000 | ORAL_TABLET | Freq: Four times a day (QID) | ORAL | Status: DC | PRN

## 2013-03-09 ENCOUNTER — Ambulatory Visit (INDEPENDENT_AMBULATORY_CARE_PROVIDER_SITE_OTHER): Payer: Medicare Other | Admitting: Otolaryngology

## 2013-04-06 ENCOUNTER — Telehealth: Payer: Self-pay | Admitting: Family Medicine

## 2013-04-06 NOTE — Telephone Encounter (Signed)
Need approval for controlled medication.

## 2013-04-06 NOTE — Telephone Encounter (Signed)
Okay to refill? 

## 2013-04-10 MED ORDER — ALPRAZOLAM 1 MG PO TABS: 1.0000 mg | ORAL_TABLET | Freq: Three times a day (TID) | ORAL | Status: DC | PRN

## 2013-04-10 MED ORDER — OXYCODONE-ACETAMINOPHEN 7.5-325 MG PO TABS: 1.0000 | ORAL_TABLET | Freq: Four times a day (QID) | ORAL | Status: DC | PRN

## 2013-04-10 NOTE — Telephone Encounter (Signed)
RX printed for provider signature

## 2013-04-17 ENCOUNTER — Ambulatory Visit: Payer: Self-pay | Admitting: Family Medicine

## 2013-04-18 ENCOUNTER — Telehealth: Payer: Self-pay | Admitting: Family Medicine

## 2013-04-18 MED ORDER — PROMETHAZINE HCL 12.5 MG PO TABS: 12.5000 mg | ORAL_TABLET | Freq: Four times a day (QID) | ORAL | Status: DC | PRN

## 2013-04-18 NOTE — Telephone Encounter (Signed)
Med refilled.

## 2013-04-23 ENCOUNTER — Other Ambulatory Visit: Payer: Self-pay | Admitting: Family Medicine

## 2013-04-24 ENCOUNTER — Telehealth: Payer: Self-pay | Admitting: Family Medicine

## 2013-04-24 MED ORDER — ALBUTEROL SULFATE HFA 108 (90 BASE) MCG/ACT IN AERS: INHALATION_SPRAY | RESPIRATORY_TRACT | Status: DC

## 2013-04-24 MED ORDER — CARVEDILOL 12.5 MG PO TABS: 12.5000 mg | ORAL_TABLET | Freq: Two times a day (BID) | ORAL | Status: DC

## 2013-04-24 MED ORDER — SITAGLIPTIN PHOSPHATE 100 MG PO TABS: ORAL_TABLET | ORAL | Status: DC

## 2013-04-24 MED ORDER — ALPRAZOLAM 1 MG PO TABS: 1.0000 mg | ORAL_TABLET | Freq: Three times a day (TID) | ORAL | Status: DC | PRN

## 2013-04-24 NOTE — Telephone Encounter (Signed)
Med refilled.

## 2013-04-24 NOTE — Telephone Encounter (Signed)
ok 

## 2013-04-24 NOTE — Telephone Encounter (Signed)
Ok to refill Alprazolam?

## 2013-05-10 ENCOUNTER — Encounter: Payer: Self-pay | Admitting: Family Medicine

## 2013-05-10 ENCOUNTER — Ambulatory Visit (INDEPENDENT_AMBULATORY_CARE_PROVIDER_SITE_OTHER): Payer: Medicare Other | Admitting: Family Medicine

## 2013-05-10 ENCOUNTER — Other Ambulatory Visit: Payer: Self-pay | Admitting: Family Medicine

## 2013-05-10 VITALS — BP 110/64 | HR 80 | Temp 97.2°F | Resp 20 | Wt 154.0 lb

## 2013-05-10 DIAGNOSIS — Z79899 Other long term (current) drug therapy: Secondary | ICD-10-CM

## 2013-05-10 DIAGNOSIS — E119 Type 2 diabetes mellitus without complications: Secondary | ICD-10-CM

## 2013-05-10 DIAGNOSIS — I10 Essential (primary) hypertension: Secondary | ICD-10-CM

## 2013-05-10 DIAGNOSIS — F17200 Nicotine dependence, unspecified, uncomplicated: Secondary | ICD-10-CM

## 2013-05-10 DIAGNOSIS — L97511 Non-pressure chronic ulcer of other part of right foot limited to breakdown of skin: Secondary | ICD-10-CM

## 2013-05-10 DIAGNOSIS — I5032 Chronic diastolic (congestive) heart failure: Secondary | ICD-10-CM

## 2013-05-10 DIAGNOSIS — L97509 Non-pressure chronic ulcer of other part of unspecified foot with unspecified severity: Secondary | ICD-10-CM

## 2013-05-10 DIAGNOSIS — J209 Acute bronchitis, unspecified: Secondary | ICD-10-CM

## 2013-05-10 DIAGNOSIS — M5137 Other intervertebral disc degeneration, lumbosacral region: Secondary | ICD-10-CM

## 2013-05-10 DIAGNOSIS — M5136 Other intervertebral disc degeneration, lumbar region: Secondary | ICD-10-CM

## 2013-05-10 DIAGNOSIS — Z720 Tobacco use: Secondary | ICD-10-CM

## 2013-05-10 LAB — MICROALBUMIN / CREATININE URINE RATIO
Creatinine, Urine: 15.8 mg/dL
Microalb Creat Ratio: 31.6 mg/g — ABNORMAL HIGH (ref 0.0–30.0)

## 2013-05-10 MED ORDER — ALPRAZOLAM 1 MG PO TABS: 1.0000 mg | ORAL_TABLET | Freq: Three times a day (TID) | ORAL | Status: DC | PRN

## 2013-05-10 MED ORDER — OXYCODONE-ACETAMINOPHEN 7.5-325 MG PO TABS: 1.0000 | ORAL_TABLET | Freq: Four times a day (QID) | ORAL | Status: DC | PRN

## 2013-05-10 MED ORDER — PREDNISONE 10 MG PO TABS: 40.0000 mg | ORAL_TABLET | Freq: Every day | ORAL | Status: DC

## 2013-05-10 MED ORDER — DOXYCYCLINE HYCLATE 100 MG PO TABS: 100.0000 mg | ORAL_TABLET | Freq: Two times a day (BID) | ORAL | Status: DC

## 2013-05-10 NOTE — Patient Instructions (Addendum)
We will continue current medications Labs to be done today  Start antibiotics  Start the prednisone if you do not improve F/U 3 months

## 2013-05-11 DIAGNOSIS — J209 Acute bronchitis, unspecified: Secondary | ICD-10-CM | POA: Insufficient documentation

## 2013-05-11 LAB — DRUGS OF ABUSE SCREEN W/O ALC, ROUTINE URINE
Barbiturate Quant, Ur: NEGATIVE
Benzodiazepines.: POSITIVE — AB
Cocaine Metabolites: NEGATIVE
Creatinine,U: 15.8 mg/dL
Phencyclidine (PCP): NEGATIVE

## 2013-05-11 LAB — HEMOGLOBIN A1C
Hgb A1c MFr Bld: 7.4 % — ABNORMAL HIGH (ref <5.7)
Mean Plasma Glucose: 166 mg/dL — ABNORMAL HIGH (ref <117)

## 2013-05-11 LAB — CBC
HCT: 37.7 % (ref 36.0–46.0)
Hemoglobin: 12.4 g/dL (ref 12.0–15.0)
MCV: 91.3 fL (ref 78.0–100.0)
Platelets: 263 10*3/uL (ref 150–400)
RBC: 4.13 MIL/uL (ref 3.87–5.11)
WBC: 10.3 10*3/uL (ref 4.0–10.5)

## 2013-05-11 LAB — BASIC METABOLIC PANEL
Chloride: 94 mEq/L — ABNORMAL LOW (ref 96–112)
Potassium: 5.1 mEq/L (ref 3.5–5.3)
Sodium: 139 mEq/L (ref 135–145)

## 2013-05-11 NOTE — Assessment & Plan Note (Signed)
Currently compensated 

## 2013-05-11 NOTE — Assessment & Plan Note (Signed)
This does not appear to be a full on COPD flare, but she is likely headed at that way. Given mucinex samples, start antibiotics, if she begans to deteriorate she has instructions to start prednisone

## 2013-05-11 NOTE — Assessment & Plan Note (Signed)
Recurrence of tiny pinpoint opening,no signs of superinfection. Looks like the area is too moist. Advised Mary [name] to stop the creams/salves she was using. Wash and dry with hair dryer. She can use a bandaid . She did use liquid bandage before and it healed completely  She will call if not closed in 1 week, then I will refer Mary [name] to podiatry

## 2013-05-11 NOTE — Assessment & Plan Note (Signed)
Chronic pain unchanged, advised must get meds monthly, she asked for multiple fills at once

## 2013-05-11 NOTE — Progress Notes (Signed)
  Subjective:    Patient ID: Mary [name], female    DOB: [birth year], 62 y.o.   MRN: [id number]  HPI Pt here to f/u chronic medical problems. COPD- past 2 weeks has had increased cough with yellow sputum, occasional wheezing, using inhalers as typically. No increase albuterol need  CHF- weight has been stable, leg swelling has been down, weight around  Low 150's DM- Taking Januvia- CBG have been elevated 160's fasting , trying to watch diet  Right foot pain- small pin size ulcer closed up but 1 week ago began having pain that would awake her at night and she noticed it was open again, no drainage or redness, has been using neosporin on it  Review of Systems   GEN- denies fatigue, fever, weight loss,weakness, recent illness HEENT- denies eye drainage, change in vision, nasal discharge, CVS- denies chest pain, palpitations RESP- denies SOB, +cough, wheeze ABD- denies N/V, change in stools, abd pain GU- denies dysuria, hematuria, dribbling, incontinence MSK- + joint pain, muscle aches, injury Neuro- denies headache, dizziness, syncope, seizure activity      Objective:   Physical Exam GEN- NAD alert and oriented x3 ,  HEENT- PERRL, EOMI, oropharynx clear, MMM, nares clear,  Neck- supple, no LAD CVS- Irregular, Irregular rhythm, no murmur RSP- harsh cough , decreased BS bases, breathing comfortably, no wheeze EXT- No edema,  Skin- Right foot- small opening in skin between 4th and 5th digits,mild maceration of skin, no drainage, no erythema , normal warmth Pulse- DP 2+, radial 2+        Assessment & Plan:

## 2013-05-11 NOTE — Assessment & Plan Note (Signed)
A1C deteroriated some, unfortunately she had severe hypoglycemia with Glipizide, so she may be sensitive to other sulfonoureas For now will continue [place] and have her monitor diet very strictly Will not add any other agents On ACCEI

## 2013-05-11 NOTE — Assessment & Plan Note (Signed)
Unchanged , continues to smoke, cessation encouarged

## 2013-05-15 LAB — BENZODIAZEPINES (GC/LC/MS), URINE
Alprazolam (GC/LC/MS), ur confirm: 530 ng/mL
Clonazepam metabolite (GC/LC/MS), ur confirm: NEGATIVE ng/mL
Diazepam (GC/LC/MS), ur confirm: NEGATIVE ng/mL
Flunitrazepam metabolite (GC/LC/MS), ur confirm: NEGATIVE ng/mL
Lorazepam (GC/LC/MS), ur confirm: NEGATIVE ng/mL
Temazepam (GC/LC/MS), ur confirm: NEGATIVE ng/mL
Triazolam metabolite (GC/LC/MS), ur confirm: NEGATIVE ng/mL

## 2013-05-19 ENCOUNTER — Other Ambulatory Visit: Payer: Self-pay | Admitting: Family Medicine

## 2013-05-19 NOTE — Telephone Encounter (Signed)
OK refill?? 

## 2013-05-22 ENCOUNTER — Other Ambulatory Visit: Payer: Self-pay | Admitting: Cardiovascular Disease

## 2013-05-22 LAB — OPIATES/OPIOIDS (LC/MS-MS)
Codeine Urine: NEGATIVE ng/mL
Heroin (6-AM), UR: NEGATIVE ng/mL
Morphine Urine: NEGATIVE ng/mL
Noroxycodone, Ur: 373 ng/mL
Oxymorphone: 60 ng/mL

## 2013-05-22 NOTE — Telephone Encounter (Signed)
Rx was sent to pharmacy electronically. 

## 2013-05-22 NOTE — Telephone Encounter (Signed)
Ok to refill 

## 2013-05-22 NOTE — Telephone Encounter (Signed)
Okay to refill? 

## 2013-05-22 NOTE — Telephone Encounter (Signed)
Med refilled.

## 2013-05-24 ENCOUNTER — Telehealth: Payer: Self-pay | Admitting: Family Medicine

## 2013-05-24 MED ORDER — FUROSEMIDE 40 MG PO TABS: 40.0000 mg | ORAL_TABLET | Freq: Every day | ORAL | Status: DC

## 2013-05-24 NOTE — Telephone Encounter (Signed)
Med refilled.

## 2013-05-24 NOTE — Telephone Encounter (Signed)
Ok to refill 

## 2013-05-24 NOTE — Telephone Encounter (Signed)
Okay to refill, give 6 refills

## 2013-05-30 ENCOUNTER — Encounter: Payer: Self-pay | Admitting: Family Medicine

## 2013-06-01 ENCOUNTER — Telehealth: Payer: Self-pay | Admitting: Family Medicine

## 2013-06-01 NOTE — Telephone Encounter (Signed)
Ok to refill 

## 2013-06-02 MED ORDER — ALPRAZOLAM 1 MG PO TABS
1.0000 mg | ORAL_TABLET | Freq: Three times a day (TID) | ORAL | Status: DC | PRN
Start: 2013-06-02 — End: 2013-06-29

## 2013-06-02 MED ORDER — OXYCODONE-ACETAMINOPHEN 7.5-325 MG PO TABS: 1.0000 | ORAL_TABLET | Freq: Four times a day (QID) | ORAL | Status: DC | PRN

## 2013-06-02 NOTE — Telephone Encounter (Signed)
Med(s) refilled  And ready for pickup

## 2013-06-02 NOTE — Telephone Encounter (Signed)
YOu can print, make sure you write on there no fill until the 16th of August

## 2013-06-13 ENCOUNTER — Other Ambulatory Visit: Payer: Self-pay | Admitting: Family Medicine

## 2013-06-13 NOTE — Telephone Encounter (Signed)
?   OK to Refill - Last RF 05/19/2013

## 2013-06-14 ENCOUNTER — Telehealth: Payer: Self-pay | Admitting: Family Medicine

## 2013-06-14 MED ORDER — FLUOXETINE HCL 10 MG PO CAPS: ORAL_CAPSULE | ORAL | Status: DC

## 2013-06-14 MED ORDER — NYSTATIN 100000 UNIT/ML MT SUSP: OROMUCOSAL | Status: DC

## 2013-06-14 MED ORDER — POTASSIUM CHLORIDE ER 10 MEQ PO TBCR: 10.0000 meq | EXTENDED_RELEASE_TABLET | Freq: Every day | ORAL | Status: DC

## 2013-06-14 NOTE — Telephone Encounter (Signed)
Okay to refill? 

## 2013-06-14 NOTE — Telephone Encounter (Signed)
?   OK to Refill nystatin

## 2013-06-14 NOTE — Telephone Encounter (Signed)
Potassium Chloride 10 meq tab 1 QD #30 Fluoxetine 10 mg cap 1 QD #30 Nystatin Susp 100,000 units take 5 mls (500,000 units) QID #60

## 2013-06-14 NOTE — Telephone Encounter (Signed)
Rx Refilled  

## 2013-06-16 ENCOUNTER — Ambulatory Visit: Payer: Medicare Other | Admitting: Family Medicine

## 2013-06-29 ENCOUNTER — Telehealth: Payer: Self-pay | Admitting: Family Medicine

## 2013-06-29 MED ORDER — OXYCODONE-ACETAMINOPHEN 7.5-325 MG PO TABS: 1.0000 | ORAL_TABLET | Freq: Four times a day (QID) | ORAL | Status: DC | PRN

## 2013-06-29 MED ORDER — ALPRAZOLAM 1 MG PO TABS
1.0000 mg | ORAL_TABLET | Freq: Three times a day (TID) | ORAL | Status: DC | PRN
Start: 2013-06-29 — End: 2013-08-09

## 2013-06-29 NOTE — Telephone Encounter (Signed)
meds printed ready to be signed and pt to pick up on Wednesday

## 2013-07-06 ENCOUNTER — Telehealth: Payer: Self-pay | Admitting: Family Medicine

## 2013-07-06 NOTE — Telephone Encounter (Signed)
Okay to refill? 

## 2013-07-06 NOTE — Telephone Encounter (Signed)
Xanax 1 mg tab 1 TID prn last rf 06/07/13

## 2013-07-06 NOTE — Telephone Encounter (Signed)
Ok to refill 

## 2013-07-06 NOTE — Telephone Encounter (Signed)
Script was refilled on 06/29/13 stating do not refill until Sept 16

## 2013-07-15 ENCOUNTER — Encounter: Payer: Self-pay | Admitting: *Deleted

## 2013-07-20 ENCOUNTER — Encounter: Payer: Self-pay | Admitting: Cardiovascular Disease

## 2013-07-20 ENCOUNTER — Ambulatory Visit: Payer: Medicare Other | Admitting: Cardiovascular Disease

## 2013-07-20 ENCOUNTER — Ambulatory Visit (INDEPENDENT_AMBULATORY_CARE_PROVIDER_SITE_OTHER): Payer: Medicare Other | Admitting: Cardiovascular Disease

## 2013-07-20 VITALS — BP 120/80 | HR 77 | Ht 67.0 in | Wt 156.9 lb

## 2013-07-20 DIAGNOSIS — I119 Hypertensive heart disease without heart failure: Secondary | ICD-10-CM

## 2013-07-20 DIAGNOSIS — R609 Edema, unspecified: Secondary | ICD-10-CM

## 2013-07-20 DIAGNOSIS — G8929 Other chronic pain: Secondary | ICD-10-CM

## 2013-07-20 DIAGNOSIS — E119 Type 2 diabetes mellitus without complications: Secondary | ICD-10-CM

## 2013-07-20 DIAGNOSIS — R600 Localized edema: Secondary | ICD-10-CM

## 2013-07-20 DIAGNOSIS — Z720 Tobacco use: Secondary | ICD-10-CM

## 2013-07-20 DIAGNOSIS — F17200 Nicotine dependence, unspecified, uncomplicated: Secondary | ICD-10-CM

## 2013-07-20 MED ORDER — CARVEDILOL 12.5 MG PO TABS: 25.0000 mg | ORAL_TABLET | Freq: Two times a day (BID) | ORAL | Status: DC

## 2013-07-20 NOTE — Patient Instructions (Addendum)
Your physician has recommended you make the following change in your medication: STOP the tiazac ( cardizem). You can take xtra dose of furosemide in the afternoon. Increase the carvedilol to 25 mg twice daily. (2 of the 12.5 mg twice daily) until gone then start new prescription as directed on the bottle. This has already been sent to your pharmacy.  Your physician recommends that you schedule a follow-up appointment in: 6-8 WEEKS.

## 2013-07-21 ENCOUNTER — Other Ambulatory Visit: Payer: Self-pay | Admitting: *Deleted

## 2013-07-21 MED ORDER — CARVEDILOL 25 MG PO TABS: 25.0000 mg | ORAL_TABLET | Freq: Two times a day (BID) | ORAL | Status: DC

## 2013-07-22 ENCOUNTER — Encounter: Payer: Self-pay | Admitting: Cardiovascular Disease

## 2013-07-22 NOTE — Progress Notes (Signed)
Patient ID: [name], female   DOB: 1951-10-12, 62 y.o.   MRN: [id number]     HPI: CARLISLE TIMOTHY, is a 62 y.o. female who presents for six-month cardiology evaluation.  Is has a history of atrial fibrillation which now is permanent and as well as ongoing tobacco use. Has a history of COPD. was hospitalized in February 2014 and felt to have diastolic congestive heart failure. During that hospitalization she was taken off metformin for diabetes and started on Januvia. She has been on low-dose ACE inhibitor or an echo Doppler in November 2013 showed an ejection fraction of 55-60% with grade 1 diastolic dysfunction, moderate mitral regurgitation, moderate tricuspid regurgitation, biatrial enlargement. Over the past 6 months, she complains of increasing leg swelling the torso she continues to smoke she denies chest tightness the she denies any presyncope or syncope.  Past Medical History  Diagnosis Date  . Diabetes mellitus   . Arthritis   . Anxiety   . Chronic pain     Bacl pain, Disc L5-S1- Dr. Joya Salm in [place]  . Hyperlipidemia   . Hypertension   . Tachycardia   . Bronchial asthma   . Atrial fibrillation   . COPD (chronic obstructive pulmonary disease)   . DDD (degenerative disc disease)     Radicular symptoms  . Shortness of breath   . CHF (congestive heart failure)   . Hx of echocardiogram     Was interpreted by Dr Doylene Canard that showed an Ef in the 50-60% range with grade 1 diastolic dysfunction. she had moderate MR, biatrial enlargement, moderate TR and at that time estimated RV systolic pressure was 43 mm.  . History of stress test 06/2011    Abnormal myocardial perfusion study.    Past Surgical History  Procedure Laterality Date  . Carpal tunnel release      x 2  . Breast cyst removed    . Abdominal hysterectomy      partial  . Tubal ligation    . Tonsillectomy      Allergies  Allergen Reactions  . Adhesive [Tape] Other (See Comments)    Tears skin off.   . Latex Other  (See Comments)    In tape, tears skin off.  . Zocor [Simvastatin - High Dose] Nausea And Vomiting    Current Outpatient Prescriptions  Medication Sig Dispense Refill  . ACCU-CHEK AVIVA PLUS test strip USE TO TEST BLOOD SUGAR TWICE DAILY  100 each  4  . albuterol (PROAIR HFA) 108 (90 BASE) MCG/ACT inhaler INHALE TWO PUFFS BY MOUTH EVERY FOUR HOURS AS NEEDED SHORTNESS OFBREATH  9 each  2  . ALPRAZolam (XANAX) 1 MG tablet Take 1 tablet (1 mg total) by mouth 3 (three) times daily as needed.  90 tablet  1  . aspirin 81 MG tablet Take 81 mg by mouth daily.        . calcium gluconate 500 MG tablet Take 500 mg by mouth 2 (two) times daily.        Marland Kitchen doxycycline (VIBRA-TABS) 100 MG tablet Take 1 tablet (100 mg total) by mouth 2 (two) times daily.  14 tablet  0  . FLUoxetine (PROZAC) 10 MG capsule TAKE ONE CAPSULE BY MOUTH EVERY DAY  30 capsule  5  . furosemide (LASIX) 40 MG tablet Take 1 tablet (40 mg total) by mouth daily.  30 tablet  6  . Liniments (SALONPAS PAIN RELIEF PATCH) PADS Apply 1 each topically daily as needed. Pain      .  lisinopril (PRINIVIL) 2.5 MG tablet Take 1 tablet (2.5 mg total) by mouth daily.  30 tablet  0  . Multiple Vitamin (MULTIVITAMIN WITH MINERALS) TABS Take 1 tablet by mouth daily.      Marland Kitchen nystatin (MYCOSTATIN) 100000 UNIT/ML suspension as needed. 5 ml qid      . oxyCODONE-acetaminophen (PERCOCET) 7.5-325 MG per tablet Take 1 tablet by mouth 4 (four) times daily as needed for pain.  100 tablet  0  . potassium chloride (K-DUR) 10 MEQ tablet Take 1 tablet (10 mEq total) by mouth daily. With lasix  30 tablet  3  . PRADAXA 150 MG CAPS TAKE ONE CAPSULE BY MOUTH TWICE DAILY  60 capsule  0  . pravastatin (PRAVACHOL) 80 MG tablet TAKE ONE TABLET BY MOUTH NIGHTLY AT BEDTIME  30 tablet  0  . promethazine (PHENERGAN) 12.5 MG tablet TAKE ONE TABLET BY MOUTH EVERY SIX HOURS AS NEEDED NAUSEA  30 tablet  3  . sitaGLIPtin (JANUVIA) 100 MG tablet TAKE ONE TABLET BY MOUTH EVERY DAY  30 tablet   2  . sodium chloride (OCEAN) 0.65 % nasal spray Place 1 spray into the nose as needed. Congestion      . SYMBICORT 160-4.5 MCG/ACT inhaler INHALE 2 PUFFS INTO THE LUNGS 2 (TWO) TIMES DAILY.  1 Inhaler  2  . carvedilol (COREG) 25 MG tablet Take 1 tablet (25 mg total) by mouth 2 (two) times daily.  60 tablet  11   No current facility-administered medications for this visit.    History   Social History  . Marital Status: Divorced    Spouse Name: N/A    Number of Children: N/A  . Years of Education: N/A   Occupational History  . Not on file.   Social History Main Topics  . Smoking status: Current Every Day Smoker    Types: Cigarettes  . Smokeless tobacco: Not on file     Comment: smokes 3-4 cigs daily  . Alcohol Use: No  . Drug Use: No  . Sexual Activity: Not on file   Other Topics Concern  . Not on file   Social History Narrative  . No narrative on file    Socially she is divorce. She has one deceased child and 2 grandchildren. She continues to smoke. His alcohol use. She does routinely exercise.  Family History  Problem Relation Age of Onset  . Diabetes Mother   . Heart disease Mother   . Diabetes Sister   . Hypertension Sister   . Hyperlipidemia Sister   . Diabetes Brother   . Heart disease Brother   . Diabetes Brother   . Heart disease Brother     ROS is negative for fevers, chills or night sweats. She denies presyncope or syncope. She denies chest pressure. She denies nausea vomiting or diarrhea per she does note increasing leg swelling. She denies bleeding. She denies change in bowel or bladder habits.  Other system review is negative.  PE BP 120/80  Pulse 77  Ht 5\' 7"  (1.702 m)  Wt 156 lb 14.4 oz (71.169 kg)  BMI 24.57 kg/m2  General: Alert, oriented, no distress.  Skin: normal turgor, no rashes HEENT: Normocephalic, atraumatic. Pupils round and reactive; sclera anicteric;no lid lag.  Nose without nasal septal hypertrophy Mouth/Parynx benign;  Mallinpatti scale 2/3 Neck: No JVD, no carotid briuts Lungs: Decreased breath sound segmenter long-standing tobacco use. no wheezing or rales Heart: Irregularly irregular rhythm with a rate in the 70s, s1 s2 normal 1/6  systolic murmur Abdomen: soft, nontender; no hepatosplenomehaly, BS+; abdominal aorta nontender and not dilated by palpation. Pulses 2+ Extremities: 1-2+ leg edema no clubbing cyanosis, Homan's sign negative  Neurologic: grossly nonfocal  ECG: Atrial fibrillation at a rate in the 70s. Nonspecific ST changes.  LABS:  BMET    Component Value Date/Time   NA 139 05/10/2013 1251   K 5.1 05/10/2013 1251   CL 94* 05/10/2013 1251   CO2 33* 05/10/2013 1251   GLUCOSE 159* 05/10/2013 1251   BUN 8 05/10/2013 1251   CREATININE 0.69 05/10/2013 1251   CREATININE 0.44* 12/07/2012 0603   CALCIUM 10.7* 05/10/2013 1251   GFRNONAA >90 12/07/2012 0603   GFRAA >90 12/07/2012 0603     Hepatic Function Panel     Component Value Date/Time   PROT 8.5* 12/06/2012 1340   ALBUMIN 4.7 12/06/2012 1340   AST 13 12/06/2012 1340   ALT 8 12/06/2012 1340   ALKPHOS 55 12/06/2012 1340   BILITOT 0.4 12/06/2012 1340     CBC    Component Value Date/Time   WBC 10.3 05/10/2013 1251   RBC 4.13 05/10/2013 1251   HGB 12.4 05/10/2013 1251   HCT 37.7 05/10/2013 1251   PLT 263 05/10/2013 1251   MCV 91.3 05/10/2013 1251   MCH 30.0 05/10/2013 1251   MCHC 32.9 05/10/2013 1251   RDW 15.0 05/10/2013 1251   LYMPHSABS 1.9 12/06/2012 1340   MONOABS 0.5 12/06/2012 1340   EOSABS 0.1 12/06/2012 1340   BASOSABS 0.0 12/06/2012 1340     BNP    Component Value Date/Time   PROBNP 2262.0* 12/06/2012 1340    Lipid Panel     Component Value Date/Time   CHOL 122 11/10/2012 1157   TRIG 78 11/10/2012 1157   HDL 48 11/10/2012 1157   CHOLHDL 2.5 11/10/2012 1157   VLDL 16 11/10/2012 1157   LDLCALC 58 11/10/2012 1157     RADIOLOGY: No results found.    ASSESSMENT AND PLAN:  My impression is that Ms. Rhyner is a 62 year old  female with permanent fibrillation on digoxin for anticoagulation. Has a history of type 2 diabetes mellitus, hypertension, going tobacco use as well COPD. She does have increasing leg edema. I am recommending she discontinue her diltiazem dosage currently is 240 mg daily. So that heart rate does not increase I will further titrate her carvedilol from 12.5 twice a day to 25 mg twice a day. I also suggested that she can take an extra Lasix 20 mg on a when necessary basis in addition to her morning dose of 40 mg. We have discussed importance of smoking cessation.  I will see her back in the office in 6-8 weeks for followup evaluation.     Troy Sine, MD, [facility]  07/22/2013 8:05 PM

## 2013-07-27 ENCOUNTER — Telehealth: Payer: Self-pay | Admitting: Cardiovascular Disease

## 2013-07-27 MED ORDER — LISINOPRIL 2.5 MG PO TABS: 2.5000 mg | ORAL_TABLET | Freq: Every day | ORAL | Status: DC

## 2013-07-27 NOTE — Telephone Encounter (Signed)
Lisinopril prescription sent to [facility] in [place] per patient request. Change in pharmacy due to [facility] going out of business.

## 2013-07-27 NOTE — Telephone Encounter (Signed)
Currently gets meds at [facility] (going out of business) not restocking meds  .  They are out of lisinopril and she needs to have called in to [facility]

## 2013-07-28 ENCOUNTER — Telehealth: Payer: Self-pay | Admitting: Cardiovascular Disease

## 2013-07-28 MED ORDER — DABIGATRAN ETEXILATE MESYLATE 150 MG PO CAPS: ORAL_CAPSULE | ORAL | Status: DC

## 2013-07-28 NOTE — Telephone Encounter (Signed)
Refills sent to patient's new preferred pharmacy.

## 2013-07-28 NOTE — Telephone Encounter (Signed)
Changing pharmacy -need new prescription for Pradaxa 150 mg #60 with refills-Please call this to [facility].

## 2013-08-04 ENCOUNTER — Other Ambulatory Visit: Payer: Self-pay | Admitting: Family Medicine

## 2013-08-05 NOTE — Telephone Encounter (Signed)
Not due until 08/10/13

## 2013-08-08 ENCOUNTER — Ambulatory Visit: Payer: Medicare Other | Admitting: Family Medicine

## 2013-08-08 ENCOUNTER — Other Ambulatory Visit: Payer: Self-pay | Admitting: *Deleted

## 2013-08-08 MED ORDER — PRAVASTATIN SODIUM 80 MG PO TABS: 80.0000 mg | ORAL_TABLET | Freq: Every day | ORAL | Status: DC

## 2013-08-09 ENCOUNTER — Encounter: Payer: Self-pay | Admitting: Family Medicine

## 2013-08-09 ENCOUNTER — Ambulatory Visit (INDEPENDENT_AMBULATORY_CARE_PROVIDER_SITE_OTHER): Payer: Medicare Other | Admitting: Family Medicine

## 2013-08-09 VITALS — BP 102/73 | HR 78 | Temp 98.3°F | Resp 18 | Wt 152.5 lb

## 2013-08-09 DIAGNOSIS — F419 Anxiety disorder, unspecified: Secondary | ICD-10-CM

## 2013-08-09 DIAGNOSIS — F329 Major depressive disorder, single episode, unspecified: Secondary | ICD-10-CM

## 2013-08-09 DIAGNOSIS — Z23 Encounter for immunization: Secondary | ICD-10-CM

## 2013-08-09 DIAGNOSIS — E119 Type 2 diabetes mellitus without complications: Secondary | ICD-10-CM

## 2013-08-09 DIAGNOSIS — Z79899 Other long term (current) drug therapy: Secondary | ICD-10-CM

## 2013-08-09 DIAGNOSIS — F17200 Nicotine dependence, unspecified, uncomplicated: Secondary | ICD-10-CM

## 2013-08-09 DIAGNOSIS — Z720 Tobacco use: Secondary | ICD-10-CM

## 2013-08-09 DIAGNOSIS — J4489 Other specified chronic obstructive pulmonary disease: Secondary | ICD-10-CM

## 2013-08-09 DIAGNOSIS — F3289 Other specified depressive episodes: Secondary | ICD-10-CM

## 2013-08-09 DIAGNOSIS — F411 Generalized anxiety disorder: Secondary | ICD-10-CM

## 2013-08-09 DIAGNOSIS — J449 Chronic obstructive pulmonary disease, unspecified: Secondary | ICD-10-CM

## 2013-08-09 DIAGNOSIS — E785 Hyperlipidemia, unspecified: Secondary | ICD-10-CM

## 2013-08-09 DIAGNOSIS — F32A Depression, unspecified: Secondary | ICD-10-CM

## 2013-08-09 DIAGNOSIS — G8929 Other chronic pain: Secondary | ICD-10-CM

## 2013-08-09 MED ORDER — FLUOXETINE HCL 20 MG PO CAPS: ORAL_CAPSULE | ORAL | Status: DC

## 2013-08-09 MED ORDER — OXYCODONE-ACETAMINOPHEN 7.5-325 MG PO TABS: 1.0000 | ORAL_TABLET | Freq: Four times a day (QID) | ORAL | Status: DC | PRN

## 2013-08-09 MED ORDER — ALPRAZOLAM 1 MG PO TABS: 1.0000 mg | ORAL_TABLET | Freq: Three times a day (TID) | ORAL | Status: DC | PRN

## 2013-08-09 NOTE — Assessment & Plan Note (Signed)
Currently stable, needs to quit smoking Flu and pneumonia shot given, I can not see where she had pneumovax

## 2013-08-09 NOTE — Assessment & Plan Note (Signed)
Increase prozac to 20mg , ativan refilled

## 2013-08-09 NOTE — Assessment & Plan Note (Signed)
Plan to check fasting labs next month when she comes in to get her prescription

## 2013-08-09 NOTE — Assessment & Plan Note (Signed)
Discussed referral to pain management , she declines, will continue current dose of percocet UDS to be done

## 2013-08-09 NOTE — Assessment & Plan Note (Signed)
Increase prozac

## 2013-08-09 NOTE — Addendum Note (Signed)
Addended by: Vic Blackbird F on: 08/09/2013 04:29 PM   Modules accepted: Orders

## 2013-08-09 NOTE — Assessment & Plan Note (Signed)
Continue counsel on cessation

## 2013-08-09 NOTE — Progress Notes (Signed)
  Subjective:    Patient ID: Mary [name], female    DOB: 11-Sep-1951, 62 y.o.   MRN: [id number]  HPI Patient here to follow chronic medical problems. She has no specific concerns today. She was recently evaluated by cardiology and her rate controlling medications were adjusted. She is doing well with the change in medications is pending dizziness or chest pain. She's had minimal leg swelling.  Diabetes mellitus her blood sugars fasting running 120s in the evening time they run 130 to 160s. She's taken medication as prescribed no hypoglycemia  Chronic back pain-she states that she continues to have pain in her back especially when she tries to bend over it as well as her knees and her legs. No change in bowel or bladder  COPD her breathing has been doing well she continues to smoke about 2-3 cigarettes a day  Noted on Sunday she splashed hot grease on her right forearm which she cleaned using peroxide and has been applying Triple Antibiotic ointment  Depression/anxiety- been stressed out the past couple months. She moved in with her son-in-law and her grandchildren and states that this is better but her pain does get her down. She ran out of her Prozac a couple days ago and notices a difference. She continues to take her Ativan as prescribed. Declines colonoscopy  Overdue for mammogram    Review of Systems  GEN- denies fatigue, fever, weight loss,weakness, recent illness HEENT- denies eye drainage, change in vision, nasal discharge, CVS- denies chest pain, palpitations RESP- denies SOB, cough, wheeze ABD- denies N/V, change in stools, abd pain GU- denies dysuria, hematuria, dribbling, incontinence MSK- +joint pain, muscle aches, injury Neuro- denies headache, dizziness, syncope, seizure activity      Objective:   Physical Exam GEN- NAD alert and oriented x3 ,  HEENT- PERRL, EOMI, oropharynx clear, MMM, nares clear,  Neck- supple, no LAD CVS- Irregular, Irregular rhythm, no  murmur RSP-CTAB, normal WOB EXT- No edema,  Pulse- DP 2+, radial 2+ Psych- normal affect and mood       Assessment & Plan:

## 2013-08-09 NOTE — Patient Instructions (Signed)
FLu and pneumonia shot  Urine sample  Continue current medications Next month labs come fasting  Mammogram at [facility]  F/U 4 Months for Physical

## 2013-08-09 NOTE — Addendum Note (Signed)
Addended by: Sharmon Revere on: 08/09/2013 04:26 PM   Modules accepted: Orders

## 2013-08-11 ENCOUNTER — Ambulatory Visit: Payer: Medicare Other | Admitting: Family Medicine

## 2013-08-11 ENCOUNTER — Telehealth: Payer: Self-pay | Admitting: Family Medicine

## 2013-08-11 NOTE — Telephone Encounter (Signed)
Patient called in C/O getting sick after getting her Flu, and Pneumonia shots. I spoke with one of the nurses and she said that she if she was feeling fine

## 2013-08-29 ENCOUNTER — Telehealth: Payer: Self-pay | Admitting: *Deleted

## 2013-08-29 NOTE — Telephone Encounter (Signed)
Needs refill percocet and xanax?  Percocet RF 08/09/13  #100.  Xanax  Refilled twice 10/10 and 10/15  #90 each.  OK refill???

## 2013-08-29 NOTE — Telephone Encounter (Signed)
Do not refill xanax, she was given Refill script on 08/09/13 Okay to refill Percocet, she can pick up on Thursday, pharmacy will not fill until 11/15

## 2013-08-30 MED ORDER — OXYCODONE-ACETAMINOPHEN 7.5-325 MG PO TABS: 1.0000 | ORAL_TABLET | Freq: Four times a day (QID) | ORAL | Status: DC | PRN

## 2013-08-30 NOTE — Telephone Encounter (Signed)
Rx printed for provider signature

## 2013-09-06 ENCOUNTER — Other Ambulatory Visit: Payer: Self-pay | Admitting: Family Medicine

## 2013-09-08 ENCOUNTER — Other Ambulatory Visit: Payer: Self-pay | Admitting: Family Medicine

## 2013-09-08 DIAGNOSIS — Z139 Encounter for screening, unspecified: Secondary | ICD-10-CM

## 2013-09-11 ENCOUNTER — Ambulatory Visit (HOSPITAL_COMMUNITY): Payer: Medicare Other

## 2013-09-15 ENCOUNTER — Emergency Department (HOSPITAL_COMMUNITY): Payer: Medicare Other

## 2013-09-15 ENCOUNTER — Encounter (HOSPITAL_COMMUNITY): Payer: Self-pay | Admitting: Emergency Medicine

## 2013-09-15 ENCOUNTER — Emergency Department (HOSPITAL_COMMUNITY)
Admission: EM | Admit: 2013-09-15 | Discharge: 2013-09-15 | Disposition: A | Discharge status: Home / Self Care | Payer: Medicare Other | Attending: Emergency Medicine | Admitting: Emergency Medicine

## 2013-09-15 DIAGNOSIS — I10 Essential (primary) hypertension: Secondary | ICD-10-CM | POA: Insufficient documentation

## 2013-09-15 DIAGNOSIS — F17200 Nicotine dependence, unspecified, uncomplicated: Secondary | ICD-10-CM | POA: Insufficient documentation

## 2013-09-15 DIAGNOSIS — R42 Dizziness and giddiness: Secondary | ICD-10-CM | POA: Insufficient documentation

## 2013-09-15 DIAGNOSIS — W19XXXA Unspecified fall, initial encounter: Secondary | ICD-10-CM | POA: Insufficient documentation

## 2013-09-15 DIAGNOSIS — I509 Heart failure, unspecified: Secondary | ICD-10-CM | POA: Insufficient documentation

## 2013-09-15 DIAGNOSIS — Z91040 Latex allergy status: Secondary | ICD-10-CM | POA: Insufficient documentation

## 2013-09-15 DIAGNOSIS — Z7982 Long term (current) use of aspirin: Secondary | ICD-10-CM | POA: Insufficient documentation

## 2013-09-15 DIAGNOSIS — J4489 Other specified chronic obstructive pulmonary disease: Secondary | ICD-10-CM | POA: Insufficient documentation

## 2013-09-15 DIAGNOSIS — Y939 Activity, unspecified: Secondary | ICD-10-CM | POA: Insufficient documentation

## 2013-09-15 DIAGNOSIS — M129 Arthropathy, unspecified: Secondary | ICD-10-CM | POA: Insufficient documentation

## 2013-09-15 DIAGNOSIS — Z79899 Other long term (current) drug therapy: Secondary | ICD-10-CM | POA: Insufficient documentation

## 2013-09-15 DIAGNOSIS — R55 Syncope and collapse: Secondary | ICD-10-CM | POA: Insufficient documentation

## 2013-09-15 DIAGNOSIS — S5010XA Contusion of unspecified forearm, initial encounter: Secondary | ICD-10-CM | POA: Insufficient documentation

## 2013-09-15 DIAGNOSIS — Y929 Unspecified place or not applicable: Secondary | ICD-10-CM | POA: Insufficient documentation

## 2013-09-15 DIAGNOSIS — G8929 Other chronic pain: Secondary | ICD-10-CM | POA: Insufficient documentation

## 2013-09-15 DIAGNOSIS — E119 Type 2 diabetes mellitus without complications: Secondary | ICD-10-CM | POA: Insufficient documentation

## 2013-09-15 DIAGNOSIS — J449 Chronic obstructive pulmonary disease, unspecified: Secondary | ICD-10-CM | POA: Insufficient documentation

## 2013-09-15 DIAGNOSIS — R5381 Other malaise: Secondary | ICD-10-CM | POA: Insufficient documentation

## 2013-09-15 DIAGNOSIS — E7800 Pure hypercholesterolemia, unspecified: Secondary | ICD-10-CM | POA: Insufficient documentation

## 2013-09-15 DIAGNOSIS — F411 Generalized anxiety disorder: Secondary | ICD-10-CM | POA: Insufficient documentation

## 2013-09-15 DIAGNOSIS — R29898 Other symptoms and signs involving the musculoskeletal system: Secondary | ICD-10-CM

## 2013-09-15 LAB — BASIC METABOLIC PANEL
BUN: 16 mg/dL (ref 6–23)
Calcium: 10.4 mg/dL (ref 8.4–10.5)
Chloride: 93 mEq/L — ABNORMAL LOW (ref 96–112)
GFR calc Af Amer: 50 mL/min — ABNORMAL LOW (ref >90)
GFR calc non Af Amer: 43 mL/min — ABNORMAL LOW (ref >90)
Potassium: 4.2 mEq/L (ref 3.5–5.1)
Sodium: 134 mEq/L — ABNORMAL LOW (ref 135–145)

## 2013-09-15 LAB — PRO B NATRIURETIC PEPTIDE: Pro B Natriuretic peptide (BNP): 2719 pg/mL — ABNORMAL HIGH (ref 0–125)

## 2013-09-15 LAB — GLUCOSE, CAPILLARY: Glucose-Capillary: 107 mg/dL — ABNORMAL HIGH (ref 70–99)

## 2013-09-15 LAB — TROPONIN I: Troponin I: <0.3 ng/mL (ref <0.30)

## 2013-09-15 NOTE — ED Provider Notes (Signed)
CSN: [id number]     Arrival date & time 09/15/13  1533 History   First MD Initiated Contact with Patient 09/15/13 1809     Chief Complaint  Patient presents with  . Fall  . presyncopal    (Consider location/radiation/quality/duration/timing/severity/associated sxs/prior Treatment) The history is provided by the patient and a friend.   62 year old female with several month history of lightheadedness when rising from sitting. Here of late she's had several falls some attributed to the lightheadedness and dizziness and others treated to discharge legs giving out. No true syncope. Patient has a history of atrial fib no chest pain no palpitations currently no trouble breathing no nausea no vomiting. Here of late the falls occurred more often even though she been having trouble with these falls since February she is here to find out why this is occurring.  Past Medical History  Diagnosis Date  . Diabetes mellitus   . Arthritis   . Anxiety   . Chronic pain     Bacl pain, Disc L5-S1- Dr. Joya Salm in [place]  . Hyperlipidemia   . Hypertension   . Tachycardia   . Bronchial asthma   . Atrial fibrillation   . COPD (chronic obstructive pulmonary disease)   . DDD (degenerative disc disease)     Radicular symptoms  . Shortness of breath   . CHF (congestive heart failure)   . Hx of echocardiogram     Was interpreted by Dr Doylene Canard that showed an Ef in the 50-60% range with grade 1 diastolic dysfunction. she had moderate MR, biatrial enlargement, moderate TR and at that time estimated RV systolic pressure was 43 mm.  . History of stress test 06/2011    Abnormal myocardial perfusion study.   Past Surgical History  Procedure Laterality Date  . Carpal tunnel release      x 2  . Breast cyst removed    . Abdominal hysterectomy      partial  . Tubal ligation    . Tonsillectomy     Family History  Problem Relation Age of Onset  . Diabetes Mother   . Heart disease Mother   . Diabetes Sister   .  Hypertension Sister   . Hyperlipidemia Sister   . Diabetes Brother   . Heart disease Brother   . Diabetes Brother   . Heart disease Brother    History  Substance Use Topics  . Smoking status: Current Every Day Smoker    Types: Cigarettes  . Smokeless tobacco: Not on file     Comment: smokes 3-4 cigs daily  . Alcohol Use: No   OB History   Grav Para Term Preterm Abortions TAB SAB Ect Mult Living                 Review of Systems  Constitutional: Negative for fever.  HENT: Negative for congestion.   Eyes: Negative for redness.  Respiratory: Negative for shortness of breath.   Cardiovascular: Negative for chest pain.  Gastrointestinal: Negative for nausea, vomiting and abdominal pain.  Genitourinary: Negative for dysuria.  Musculoskeletal: Negative for back pain and neck pain.  Skin: Positive for wound.  Neurological: Positive for dizziness, weakness and light-headedness. Negative for seizures, syncope, speech difficulty and headaches.  Hematological: Bruises/bleeds easily.  Psychiatric/Behavioral: Negative for confusion.    Allergies  Adhesive; Latex; and Zocor  Home Medications   Current Outpatient Rx  Name  Route  Sig  Dispense  Refill  . albuterol (PROAIR HFA) 108 (90 BASE) MCG/ACT inhaler  Inhalation   Inhale 1 puff into the lungs every 6 (six) hours as needed for wheezing or shortness of breath.         . ALPRAZolam (XANAX) 1 MG tablet   Oral   Take 0.5-1 mg by mouth 3 (three) times daily as needed for anxiety.          Marland Kitchen aspirin 81 MG tablet   Oral   Take 81 mg by mouth daily.           . budesonide-formoterol (SYMBICORT) 160-4.5 MCG/ACT inhaler   Inhalation   Inhale 2 puffs into the lungs 2 (two) times daily.         . calcium gluconate 500 MG tablet   Oral   Take 500 mg by mouth 2 (two) times daily.           . carvedilol (COREG) 25 MG tablet   Oral   Take 25 mg by mouth 2 (two) times daily with a meal.         . dabigatran  (PRADAXA) 150 MG CAPS capsule   Oral   Take 150 mg by mouth 2 (two) times daily.         Marland Kitchen FLUoxetine (PROZAC) 20 MG capsule   Oral   Take 20 mg by mouth daily.         . furosemide (LASIX) 40 MG tablet   Oral   Take 1 tablet (40 mg total) by mouth daily.   30 tablet   6   . JANUVIA 100 MG tablet      take 1 tablet by mouth once daily   30 tablet   3   . lisinopril (PRINIVIL,ZESTRIL) 2.5 MG tablet   Oral   Take 1 tablet (2.5 mg total) by mouth daily.   30 tablet   6   . Multiple Vitamin (MULTIVITAMIN WITH MINERALS) TABS   Oral   Take 1 tablet by mouth daily.         Marland Kitchen nystatin (MYCOSTATIN) 100000 UNIT/ML suspension      as needed. 5 ml qid         . oxyCODONE-acetaminophen (PERCOCET) 7.5-325 MG per tablet   Oral   Take 1 tablet by mouth 4 (four) times daily as needed for pain.   100 tablet   0     DO NOT FILL UNTIL 09/09/2013   . potassium chloride (K-DUR) 10 MEQ tablet   Oral   Take 1 tablet (10 mEq total) by mouth daily. With lasix   30 tablet   3   . potassium chloride (K-DUR,KLOR-CON) 10 MEQ tablet   Oral   Take 1 tablet by mouth daily.         . pravastatin (PRAVACHOL) 80 MG tablet   Oral   Take 1 tablet (80 mg total) by mouth daily.   30 tablet   6   . promethazine (PHENERGAN) 12.5 MG tablet      take 1 tablet by mouth every 6 hours if needed for nausea   30 tablet   2   . sodium chloride (OCEAN) 0.65 % nasal spray   Nasal   Place 1 spray into the nose as needed. Congestion          BP 118/84  Pulse 98  Temp(Src) 97.9 F (36.6 C) (Oral)  Resp 17  Ht 5\' 7"  (1.702 m)  Wt 143 lb (64.864 kg)  BMI 22.39 kg/m2  SpO2 97% Physical Exam  Nursing note and  vitals reviewed. Constitutional: She is oriented to person, place, and time. She appears well-developed and well-nourished.  HENT:  Head: Normocephalic and atraumatic.  Mouth/Throat: Oropharynx is clear and moist.  Eyes: Conjunctivae and EOM are normal. Pupils are equal,  round, and reactive to light.  Neck: Normal range of motion. Neck supple.  Cardiovascular: Normal rate, regular rhythm and normal heart sounds.   No murmur heard. Pulmonary/Chest: Effort normal and breath sounds normal. No respiratory distress.  Abdominal: Soft. Bowel sounds are normal. There is no tenderness.  Musculoskeletal: Normal range of motion. She exhibits no tenderness.  Mild swelling to the right ankle only slight tenderness. No deformity.  Neurological: She is alert and oriented to person, place, and time. No cranial nerve deficit. She exhibits normal muscle tone. Coordination normal.  Skin: Skin is warm. No rash noted.  Patient with several scattered bruises also some healing scabs on her right forearm do to an adhesion tape allergy. Some mild swelling to her right ankle.    ED Course  Procedures (including critical care time) Labs Review Labs Reviewed  PRO B NATRIURETIC PEPTIDE - Abnormal; Notable for the following:    Pro B Natriuretic peptide (BNP) 2719.0 (*)    All other components within normal limits  BASIC METABOLIC PANEL - Abnormal; Notable for the following:    Sodium 134 (*)    Chloride 93 (*)    Glucose, Bld 104 (*)    Creatinine, Ser 1.30 (*)    GFR calc non Af Amer 43 (*)    GFR calc Af Amer 50 (*)    All other components within normal limits  GLUCOSE, CAPILLARY - Abnormal; Notable for the following:    Glucose-Capillary 107 (*)    All other components within normal limits  TROPONIN I   Results for orders placed during the hospital encounter of 09/15/13  TROPONIN I      Result Value Range   Troponin I <0.30  <0.30 ng/mL  PRO B NATRIURETIC PEPTIDE      Result Value Range   Pro B Natriuretic peptide (BNP) 2719.0 (*) 0 - 125 pg/mL  BASIC METABOLIC PANEL      Result Value Range   Sodium 134 (*) 135 - 145 mEq/L   Potassium 4.2  3.5 - 5.1 mEq/L   Chloride 93 (*) 96 - 112 mEq/L   CO2 30  19 - 32 mEq/L   Glucose, Bld 104 (*) 70 - 99 mg/dL   BUN 16  6 -  23 mg/dL   Creatinine, Ser 1.30 (*) 0.50 - 1.10 mg/dL   Calcium 10.4  8.4 - 10.5 mg/dL   GFR calc non Af Amer 43 (*) >90 mL/min   GFR calc Af Amer 50 (*) >90 mL/min  GLUCOSE, CAPILLARY      Result Value Range   Glucose-Capillary 107 (*) 70 - 99 mg/dL    Imaging Review Dg Chest 2 View  09/15/2013   CLINICAL DATA:  Syncope, weakness.  EXAM: CHEST  2 VIEW  COMPARISON:  12/06/2012  FINDINGS: The heart size and mediastinal contours are within normal limits. Both lungs are clear. The visualized skeletal structures are unremarkable.  IMPRESSION: No active cardiopulmonary disease.   Electronically Signed   By: Rolm Baptise M.D.   On: 09/15/2013 19:40   Ct Head Wo Contrast  09/15/2013   CLINICAL DATA:  Syncope.  EXAM: CT HEAD WITHOUT CONTRAST  TECHNIQUE: Contiguous axial images were obtained from the base of the skull through the vertex without  intravenous contrast.  COMPARISON:  December 06, 2012.  FINDINGS: Bony calvarium appears intact. No mass effect or midline shift is noted. Ventricular size is within normal limits. There is no evidence of mass lesion, hemorrhage or acute infarction. Mild chronic ischemic white matter disease is noted.  IMPRESSION: Mild chronic ischemic white matter disease. No acute intracranial abnormality seen.   Electronically Signed   By: Sabino Dick M.D.   On: 09/15/2013 19:40    EKG Interpretation    Date/Time:  Friday September 15 2013 16:02:22 EST Ventricular Rate:  95 PR Interval:    QRS Duration: 90 QT Interval:  354 QTC Calculation: 444 R Axis:   33 Text Interpretation:  Atrial fibrillation Abnormal ECG When compared with ECG of 06-Dec-2012 12:22, No significant change was found Confirmed by Lillion Elbert  MD, Merina Behrendt ([id number]) on 09/15/2013 6:18:36 PM            MDM   1. Fall, initial encounter   2. Near syncope   3. Lower extremity weakness    Patient with a chronic problem of frequent falls as being worse lately. She is on blood thinners pro DAC suffer  atrial fib. Patient's fall a lot recently. Could result in a head injury or hip injury. The cause of her falls are not clear if they're broken down to 2 components one component is a dizziness and lightheadedness this component and then other times her legs just give out its no true syncope. EKG here is negative chest x-ray is negative head CT is negative labs without significant abnormalities. Recommended that the patient the admitted due to fear of harming herself from the false the patient is refusing admission was to followup with her primary care Dr. She has been seen by neurology in the passenger followup with them. Patient understands the potential dangers of frequent falling and she will be careful.    Mervin Kung, MD 09/15/13 2025

## 2013-09-15 NOTE — ED Notes (Signed)
MD at the bedside  

## 2013-09-15 NOTE — ED Notes (Signed)
Patient transported to CT/XR ?

## 2013-09-15 NOTE — ED Notes (Signed)
Has had approximately 3-4 weeks of lightheadedness when rising from sitting.  Because of this she has had multiple falls over this time with 4 falls within 2 days. Denies CP, palpitations. Occasional nausea, no vomiting.

## 2013-09-18 ENCOUNTER — Ambulatory Visit (HOSPITAL_COMMUNITY): Payer: Medicare Other

## 2013-09-18 ENCOUNTER — Ambulatory Visit (INDEPENDENT_AMBULATORY_CARE_PROVIDER_SITE_OTHER): Payer: Medicare Other | Admitting: Family Medicine

## 2013-09-18 ENCOUNTER — Encounter (HOSPITAL_COMMUNITY): Payer: Self-pay | Admitting: Emergency Medicine

## 2013-09-18 ENCOUNTER — Observation Stay (HOSPITAL_COMMUNITY)
Admission: EM | Admit: 2013-09-18 | Discharge: 2013-09-19 | Disposition: A | Discharge status: Home / Self Care | Payer: Medicare Other | Attending: Internal Medicine | Admitting: Internal Medicine

## 2013-09-18 ENCOUNTER — Emergency Department (HOSPITAL_COMMUNITY): Payer: Medicare Other

## 2013-09-18 VITALS — BP 102/80 | HR 120 | Temp 97.4°F | Resp 20 | Ht 64.0 in | Wt 146.0 lb

## 2013-09-18 DIAGNOSIS — R600 Localized edema: Secondary | ICD-10-CM

## 2013-09-18 DIAGNOSIS — Z9181 History of falling: Secondary | ICD-10-CM

## 2013-09-18 DIAGNOSIS — R296 Repeated falls: Secondary | ICD-10-CM

## 2013-09-18 DIAGNOSIS — E785 Hyperlipidemia, unspecified: Secondary | ICD-10-CM | POA: Diagnosis present

## 2013-09-18 DIAGNOSIS — R079 Chest pain, unspecified: Secondary | ICD-10-CM | POA: Insufficient documentation

## 2013-09-18 DIAGNOSIS — J4489 Other specified chronic obstructive pulmonary disease: Secondary | ICD-10-CM | POA: Insufficient documentation

## 2013-09-18 DIAGNOSIS — I4891 Unspecified atrial fibrillation: Secondary | ICD-10-CM | POA: Diagnosis present

## 2013-09-18 DIAGNOSIS — R531 Weakness: Secondary | ICD-10-CM

## 2013-09-18 DIAGNOSIS — W19XXXA Unspecified fall, initial encounter: Secondary | ICD-10-CM

## 2013-09-18 DIAGNOSIS — F411 Generalized anxiety disorder: Secondary | ICD-10-CM

## 2013-09-18 DIAGNOSIS — L821 Other seborrheic keratosis: Secondary | ICD-10-CM

## 2013-09-18 DIAGNOSIS — F419 Anxiety disorder, unspecified: Secondary | ICD-10-CM

## 2013-09-18 DIAGNOSIS — H9312 Tinnitus, left ear: Secondary | ICD-10-CM

## 2013-09-18 DIAGNOSIS — Z720 Tobacco use: Secondary | ICD-10-CM

## 2013-09-18 DIAGNOSIS — F329 Major depressive disorder, single episode, unspecified: Secondary | ICD-10-CM

## 2013-09-18 DIAGNOSIS — D229 Melanocytic nevi, unspecified: Secondary | ICD-10-CM

## 2013-09-18 DIAGNOSIS — E860 Dehydration: Secondary | ICD-10-CM | POA: Diagnosis present

## 2013-09-18 DIAGNOSIS — W19XXXD Unspecified fall, subsequent encounter: Secondary | ICD-10-CM

## 2013-09-18 DIAGNOSIS — G8929 Other chronic pain: Secondary | ICD-10-CM

## 2013-09-18 DIAGNOSIS — R5381 Other malaise: Secondary | ICD-10-CM

## 2013-09-18 DIAGNOSIS — R0902 Hypoxemia: Secondary | ICD-10-CM

## 2013-09-18 DIAGNOSIS — M5136 Other intervertebral disc degeneration, lumbar region: Secondary | ICD-10-CM

## 2013-09-18 DIAGNOSIS — E119 Type 2 diabetes mellitus without complications: Secondary | ICD-10-CM | POA: Diagnosis present

## 2013-09-18 DIAGNOSIS — J449 Chronic obstructive pulmonary disease, unspecified: Secondary | ICD-10-CM | POA: Diagnosis present

## 2013-09-18 DIAGNOSIS — I959 Hypotension, unspecified: Principal | ICD-10-CM | POA: Diagnosis present

## 2013-09-18 DIAGNOSIS — I5030 Unspecified diastolic (congestive) heart failure: Secondary | ICD-10-CM

## 2013-09-18 DIAGNOSIS — R55 Syncope and collapse: Secondary | ICD-10-CM

## 2013-09-18 LAB — CBC WITH DIFFERENTIAL/PLATELET
Eosinophils Absolute: 0.1 10*3/uL (ref 0.0–0.7)
Eosinophils Relative: 2 % (ref 0–5)
Hemoglobin: 11.3 g/dL — ABNORMAL LOW (ref 12.0–15.0)
Lymphocytes Relative: 20 % (ref 12–46)
MCH: 29.8 pg (ref 26.0–34.0)
Monocytes Absolute: 0.6 10*3/uL (ref 0.1–1.0)
Monocytes Relative: 7 % (ref 3–12)
Neutro Abs: 5.7 10*3/uL (ref 1.7–7.7)
Platelets: 147 10*3/uL — ABNORMAL LOW (ref 150–400)
RBC: 3.79 MIL/uL — ABNORMAL LOW (ref 3.87–5.11)
RDW: 15.3 % (ref 11.5–15.5)

## 2013-09-18 LAB — TROPONIN I: Troponin I: <0.3 ng/mL (ref <0.30)

## 2013-09-18 LAB — COMPREHENSIVE METABOLIC PANEL
BUN: 13 mg/dL (ref 6–23)
CO2: 27 mEq/L (ref 19–32)
Calcium: 9.6 mg/dL (ref 8.4–10.5)
Chloride: 100 mEq/L (ref 96–112)
Creatinine, Ser: 1.25 mg/dL — ABNORMAL HIGH (ref 0.50–1.10)
GFR calc Af Amer: 52 mL/min — ABNORMAL LOW (ref >90)
GFR calc non Af Amer: 45 mL/min — ABNORMAL LOW (ref >90)
Glucose, Bld: 127 mg/dL — ABNORMAL HIGH (ref 70–99)
Sodium: 137 mEq/L (ref 135–145)
Total Bilirubin: 0.4 mg/dL (ref 0.3–1.2)

## 2013-09-18 LAB — PRO B NATRIURETIC PEPTIDE: Pro B Natriuretic peptide (BNP): 4591 pg/mL — ABNORMAL HIGH (ref 0–125)

## 2013-09-18 MED ORDER — LINAGLIPTIN 5 MG PO TABS
5.0000 mg | ORAL_TABLET | Freq: Every day | ORAL | Status: DC
  Administered 2013-09-19: 5 mg via ORAL
  Filled 2013-09-18: qty 1

## 2013-09-18 MED ORDER — ONDANSETRON HCL 4 MG/2ML IJ SOLN: 4.0000 mg | Freq: Four times a day (QID) | INTRAMUSCULAR | Status: DC | PRN

## 2013-09-18 MED ORDER — TEMAZEPAM 15 MG PO CAPS
15.0000 mg | ORAL_CAPSULE | Freq: Every evening | ORAL | Status: DC | PRN
  Administered 2013-09-19: 15 mg via ORAL
  Filled 2013-09-18: qty 1

## 2013-09-18 MED ORDER — BUDESONIDE-FORMOTEROL FUMARATE 160-4.5 MCG/ACT IN AERO
INHALATION_SPRAY | RESPIRATORY_TRACT | Status: AC
  Filled 2013-09-18: qty 6

## 2013-09-18 MED ORDER — ALPRAZOLAM 0.5 MG PO TABS
0.5000 mg | ORAL_TABLET | Freq: Three times a day (TID) | ORAL | Status: DC | PRN
  Administered 2013-09-18: 1 mg via ORAL
  Filled 2013-09-18: qty 2

## 2013-09-18 MED ORDER — ACETAMINOPHEN 325 MG PO TABS: 650.0000 mg | ORAL_TABLET | Freq: Four times a day (QID) | ORAL | Status: DC | PRN

## 2013-09-18 MED ORDER — DABIGATRAN ETEXILATE MESYLATE 150 MG PO CAPS
150.0000 mg | ORAL_CAPSULE | Freq: Two times a day (BID) | ORAL | Status: DC
  Administered 2013-09-18 – 2013-09-19 (×2): 150 mg via ORAL
  Filled 2013-09-18 (×4): qty 1

## 2013-09-18 MED ORDER — ACETAMINOPHEN 650 MG RE SUPP: 650.0000 mg | Freq: Four times a day (QID) | RECTAL | Status: DC | PRN

## 2013-09-18 MED ORDER — BUDESONIDE-FORMOTEROL FUMARATE 160-4.5 MCG/ACT IN AERO
2.0000 | INHALATION_SPRAY | Freq: Two times a day (BID) | RESPIRATORY_TRACT | Status: DC
  Administered 2013-09-18 – 2013-09-19 (×2): 2 via RESPIRATORY_TRACT
  Filled 2013-09-18: qty 6

## 2013-09-18 MED ORDER — ASPIRIN 81 MG PO CHEW
81.0000 mg | CHEWABLE_TABLET | Freq: Every day | ORAL | Status: DC
  Administered 2013-09-19: 81 mg via ORAL
  Filled 2013-09-18: qty 1

## 2013-09-18 MED ORDER — SODIUM CHLORIDE 0.9 % IV SOLN
INTRAVENOUS | Status: DC
  Administered 2013-09-18 – 2013-09-19 (×2): via INTRAVENOUS

## 2013-09-18 MED ORDER — HYDROCODONE-ACETAMINOPHEN 5-325 MG PO TABS
1.0000 | ORAL_TABLET | ORAL | Status: DC | PRN
  Administered 2013-09-18: 1 via ORAL
  Administered 2013-09-19 (×2): 2 via ORAL
  Filled 2013-09-18: qty 2
  Filled 2013-09-18: qty 1
  Filled 2013-09-18: qty 2

## 2013-09-18 MED ORDER — FLUOXETINE HCL 20 MG PO CAPS
20.0000 mg | ORAL_CAPSULE | Freq: Every day | ORAL | Status: DC
  Administered 2013-09-19: 20 mg via ORAL
  Filled 2013-09-18: qty 1

## 2013-09-18 MED ORDER — SIMVASTATIN 20 MG PO TABS: 40.0000 mg | ORAL_TABLET | Freq: Every day | ORAL | Status: DC

## 2013-09-18 MED ORDER — SODIUM CHLORIDE 0.9 % IV SOLN
Freq: Once | INTRAVENOUS | Status: AC
  Administered 2013-09-18: 16:00:00 via INTRAVENOUS

## 2013-09-18 MED ORDER — ALBUTEROL SULFATE HFA 108 (90 BASE) MCG/ACT IN AERS: 1.0000 | INHALATION_SPRAY | Freq: Four times a day (QID) | RESPIRATORY_TRACT | Status: DC | PRN

## 2013-09-18 MED ORDER — DABIGATRAN ETEXILATE MESYLATE 150 MG PO CAPS
ORAL_CAPSULE | ORAL | Status: AC
  Filled 2013-09-18: qty 1

## 2013-09-18 MED ORDER — SODIUM CHLORIDE 0.9 % IJ SOLN
3.0000 mL | Freq: Two times a day (BID) | INTRAMUSCULAR | Status: DC
  Administered 2013-09-18: 3 mL via INTRAVENOUS

## 2013-09-18 MED ORDER — ONDANSETRON HCL 4 MG PO TABS: 4.0000 mg | ORAL_TABLET | Freq: Four times a day (QID) | ORAL | Status: DC | PRN

## 2013-09-18 MED ORDER — ATORVASTATIN CALCIUM 20 MG PO TABS
20.0000 mg | ORAL_TABLET | Freq: Every day | ORAL | Status: DC
  Administered 2013-09-18: 20 mg via ORAL
  Filled 2013-09-18: qty 1

## 2013-09-18 MED ORDER — METOPROLOL TARTRATE 25 MG PO TABS
12.5000 mg | ORAL_TABLET | Freq: Two times a day (BID) | ORAL | Status: DC
  Administered 2013-09-18 – 2013-09-19 (×2): 12.5 mg via ORAL
  Filled 2013-09-18 (×2): qty 1

## 2013-09-18 MED ORDER — ALUM & MAG HYDROXIDE-SIMETH 200-200-20 MG/5ML PO SUSP: 30.0000 mL | Freq: Four times a day (QID) | ORAL | Status: DC | PRN

## 2013-09-18 NOTE — ED Notes (Signed)
Chest pain, sent from Dr Dorian Heckle office.for eval. Seen here on 11/21 after fall.Has fallen x 4 since then.

## 2013-09-18 NOTE — Assessment & Plan Note (Signed)
Chest pain due to A fib RVR, needs rule out as well

## 2013-09-18 NOTE — Patient Instructions (Addendum)
Go directly to ER for chest pain and abdominal pain Your Heart rate is too high

## 2013-09-18 NOTE — ED Notes (Signed)
MD at bedside during primary assessment.

## 2013-09-18 NOTE — ED Notes (Signed)
Pt was given a supper tray. Pt requesting pain medication for chronic pain. Pt made aware that her BP is still to low to receive narcotic pain medications. Pt also requesting something for her nerves. RN made aware.

## 2013-09-18 NOTE — ED Provider Notes (Signed)
CSN: [id number]     Arrival date & time 09/18/13  1528 History   First MD Initiated Contact with Patient 09/18/13 1549    Scribed for Veryl Speak, MD, the patient was seen in room APA04/APA04. This chart was scribed by Denice Bors, ED scribe. Patient's care was started at 4:05 PM  Chief Complaint  Patient presents with  . Chest Pain   (Consider location/radiation/quality/duration/timing/severity/associated sxs/prior Treatment) The history is provided by the patient. No language interpreter was used.   HPI Comments: Mary [name] is a 62 y.o. female who presents to the Emergency Department with PMHx of atrial fibrillation, COPD, DM, and CHF complaining of waxing and waning moderate chest pain onset yesterday. Reports associated multiple syncopal episodes, and "swimmy headed" for the past 4 days. Reports pain is exacerbated with exertion and certain positions. Denies any alleviating factors. Denies associated LOC, fever, abdominal pain, back pain, and cough. Reports she was sent here from PCP Dr. Dorian Heckle office for evaluation. Denies PMHx of CAD. Reports she was seen here for the same 09/15/13 and was offered admission, but did not want to stay.    Past Medical History  Diagnosis Date  . Diabetes mellitus   . Arthritis   . Anxiety   . Chronic pain     Bacl pain, Disc L5-S1- Dr. Joya Salm in [place]  . Hyperlipidemia   . Hypertension   . Tachycardia   . Bronchial asthma   . Atrial fibrillation   . COPD (chronic obstructive pulmonary disease)   . DDD (degenerative disc disease)     Radicular symptoms  . Shortness of breath   . CHF (congestive heart failure)   . Hx of echocardiogram     Was interpreted by Dr Doylene Canard that showed an Ef in the 50-60% range with grade 1 diastolic dysfunction. she had moderate MR, biatrial enlargement, moderate TR and at that time estimated RV systolic pressure was 43 mm.  . History of stress test 06/2011    Abnormal myocardial perfusion study.   Past  Surgical History  Procedure Laterality Date  . Carpal tunnel release      x 2  . Breast cyst removed    . Abdominal hysterectomy      partial  . Tubal ligation    . Tonsillectomy     Family History  Problem Relation Age of Onset  . Diabetes Mother   . Heart disease Mother   . Diabetes Sister   . Hypertension Sister   . Hyperlipidemia Sister   . Diabetes Brother   . Heart disease Brother   . Diabetes Brother   . Heart disease Brother    History  Substance Use Topics  . Smoking status: Former Smoker    Types: Cigarettes  . Smokeless tobacco: Not on file     Comment: smokes 3-4 cigs daily  . Alcohol Use: No   OB History   Grav Para Term Preterm Abortions TAB SAB Ect Mult Living                 Review of Systems  Cardiovascular: Positive for chest pain.  Neurological: Positive for syncope.  All other systems reviewed and are negative.   A complete 10 system review of systems was obtained and all systems are negative except as noted in the HPI and PMHx.    Allergies  Adhesive; Latex; and Zocor  Home Medications   Current Outpatient Rx  Name  Route  Sig  Dispense  Refill  . albuterol (  PROAIR HFA) 108 (90 BASE) MCG/ACT inhaler   Inhalation   Inhale 1 puff into the lungs every 6 (six) hours as needed for wheezing or shortness of breath.         . ALPRAZolam (XANAX) 1 MG tablet   Oral   Take 0.5-1 mg by mouth 3 (three) times daily as needed for anxiety.          Marland Kitchen aspirin 81 MG tablet   Oral   Take 81 mg by mouth daily.           . budesonide-formoterol (SYMBICORT) 160-4.5 MCG/ACT inhaler   Inhalation   Inhale 2 puffs into the lungs 2 (two) times daily.         . calcium gluconate 500 MG tablet   Oral   Take 500 mg by mouth 2 (two) times daily.           . carvedilol (COREG) 25 MG tablet   Oral   Take 25 mg by mouth 2 (two) times daily with a meal.         . dabigatran (PRADAXA) 150 MG CAPS capsule   Oral   Take 150 mg by mouth 2 (two)  times daily.         Marland Kitchen FLUoxetine (PROZAC) 20 MG capsule   Oral   Take 20 mg by mouth daily.         . furosemide (LASIX) 40 MG tablet   Oral   Take 1 tablet (40 mg total) by mouth daily.   30 tablet   6   . JANUVIA 100 MG tablet      take 1 tablet by mouth once daily   30 tablet   3   . lisinopril (PRINIVIL,ZESTRIL) 2.5 MG tablet   Oral   Take 1 tablet (2.5 mg total) by mouth daily.   30 tablet   6   . Multiple Vitamin (MULTIVITAMIN WITH MINERALS) TABS   Oral   Take 1 tablet by mouth daily.         Marland Kitchen oxyCODONE-acetaminophen (PERCOCET) 7.5-325 MG per tablet   Oral   Take 1 tablet by mouth 4 (four) times daily as needed for pain.   100 tablet   0     DO NOT FILL UNTIL 09/09/2013   . potassium chloride (K-DUR) 10 MEQ tablet   Oral   Take 1 tablet (10 mEq total) by mouth daily. With lasix   30 tablet   3   . potassium chloride (K-DUR,KLOR-CON) 10 MEQ tablet   Oral   Take 1 tablet by mouth daily.         . pravastatin (PRAVACHOL) 80 MG tablet   Oral   Take 1 tablet (80 mg total) by mouth daily.   30 tablet   6   . promethazine (PHENERGAN) 12.5 MG tablet      take 1 tablet by mouth every 6 hours if needed for nausea   30 tablet   2   . sodium chloride (OCEAN) 0.65 % nasal spray   Nasal   Place 1 spray into the nose as needed. Congestion          BP 72/40  Pulse 124  Temp(Src) 97.6 F (36.4 C) (Oral)  Resp 24  SpO2 98% Physical Exam  Nursing note and vitals reviewed. Constitutional: She is oriented to person, place, and time. She appears well-developed and well-nourished. No distress.  HENT:  Head: Normocephalic and atraumatic.  Eyes: EOM are normal.  Neck: Neck supple. No tracheal deviation present.  Cardiovascular: An irregularly irregular rhythm present. Tachycardia present.   Pulmonary/Chest: Effort normal. No respiratory distress.  Abdominal: Soft. There is no tenderness.  Musculoskeletal: Normal range of motion.  Neurological:  She is alert and oriented to person, place, and time.  Skin: Skin is warm and dry.  Psychiatric: She has a normal mood and affect. Her behavior is normal.    ED Course  Procedures (including critical care time) COORDINATION OF CARE:  Nursing notes reviewed. Vital signs reviewed. Initial pt interview and examination performed.   4:05 PM-Discussed work up plan with pt at bedside, which includes CBC with diff panel, CXR, CMP, troponin, BNP, and EKG. Pt agrees with plan.   Treatment plan initiated: Medications  0.9 %  sodium chloride infusion ( Intravenous New Bag/Given 09/18/13 1621)     Initial diagnostic testing ordered.    Labs Review Labs Reviewed  CBC WITH DIFFERENTIAL - Abnormal; Notable for the following:    RBC 3.79 (*)    Hemoglobin 11.3 (*)    HCT 34.7 (*)    Platelets 147 (*)    All other components within normal limits  COMPREHENSIVE METABOLIC PANEL - Abnormal; Notable for the following:    Glucose, Bld 127 (*)    Creatinine, Ser 1.25 (*)    GFR calc non Af Amer 45 (*)    GFR calc Af Amer 52 (*)    All other components within normal limits  PRO B NATRIURETIC PEPTIDE - Abnormal; Notable for the following:    Pro B Natriuretic peptide (BNP) 4591.0 (*)    All other components within normal limits  TROPONIN I   Imaging Review Dg Chest Port 1 View  09/18/2013   CLINICAL DATA:  Shortness of breath.  EXAM: PORTABLE CHEST - 1 VIEW  COMPARISON:  09/15/2013  FINDINGS: The heart size and mediastinal contours are within normal limits. Both lungs are clear. The visualized skeletal structures are unremarkable.  IMPRESSION: No active disease.   Electronically Signed   By: Aletta Edouard M.D.   On: 09/18/2013 16:56    EKG Interpretation    Date/Time:  Monday September 18 2013 15:35:03 EST Ventricular Rate:  118 PR Interval:    QRS Duration: 78 QT Interval:  338 QTC Calculation: 473 R Axis:   -16 Text Interpretation:  Atrial fibrillation with rapid ventricular response  Nonspecific ST abnormality Abnormal ECG When compared with ECG of 15-Sep-2013 16:02, Nonspecific T wave abnormality now evident in Lateral leads Confirmed by DELOS  MD, Grey Rakestraw ([id number]) on 09/18/2013 6:09:19 PM            MDM  No diagnosis found. Patient is a 62 year old female with extensive past medical history including diabetes, congestive heart failure, coronary artery disease, atrial fibrillation. She presents today with complaints of weakness and hypotension. She is also been passing out multiple times recently. She was here Friday and admission have been recommended however she decided this was not in her best interest and left. She has fallen 3 or 4 times over the weekend and went to see her primary Dr. this afternoon. Her blood pressure was found to be very low and she was found to be in atrial fibrillation with rapid ventricular response. Workup here reveals mild anemia with hemoglobin 11.3. Her BNP was also found to be 4600. She is in atrial fibrillation with a ventricular rate of 120 however there were no new EKG changes. As she was hypotensive I gave a 500 cc  bolus of saline which brought her blood pressure up somewhat. She was also given an additional 500 cc bolus and internal medicine was consulted for admission. I am certain as to the etiology of her hypotension, whether it is related to her atrial fibrillation or some other cause however I feel as though admission for further evaluation is in her best interest  I personally performed the services described in this documentation, which was scribed in my presence. The recorded information has been reviewed and is accurate.      Veryl Speak, MD 09/18/13 [phone]

## 2013-09-18 NOTE — Assessment & Plan Note (Signed)
To ED for further evaluation and likley admission, followed by cardiology on pradaxa

## 2013-09-18 NOTE — H&P (Signed)
[facility] History and Physical  Mary [name] [id number] DOB: 09-13-1951 DOA: 09/18/2013  Referring physician:  PCP: Vic Blackbird, MD  Specialists:   Chief Complaint: Recurrent falls, dizziness, weakness  HPI: Mary [name] is a 62 y.o. female with a past medical history of atrial fibrillation, diastolic congestive heart failure, chronic obstructive pulmonary disease who presents to the emergency department this evening with complaints of recurrent falls, weakness, dizziness, lightheadedness. She states started to feel that last Friday, reporting dizziness and lightheadedness particularly with standing and presented to the emergency department at [facility] then. Lab work included a CT scan of brain which showed no acute intracranial abnormality. At the time admission was offered however patient expressed desire to be discharged from the emergency department. Over the last several days she continues to do poorly having recurrent falls, feeling "swimmy headed" dizziness, lightheadedness, having generalized weakness. She saw her primary care provider today who referred her to the emergency department. She also complains of left-sided chest pain precipitated by movement, deep and inspiration, and applying pressure to the area. In the emergency department she was found to be hypotensive having a blood pressure of 69/47, improving to 113/58 after receiving a bolus of normal saline. Patient had been on Lasix 40 mg by mouth daily along with 25 mg by mouth twice a day of carvedilol and 2.5 mg by mouth daily of lisinopril.                                                                                 Review of Systems: The patient denies anorexia, fever, weight loss,, vision loss, decreased hearing, hoarseness, syncope, dyspnea on exertion, peripheral edema, hemoptysis, abdominal pain, melena, hematochezia, severe indigestion/heartburn, hematuria, incontinence, genital sores, suspicious skin lesions,  transient blindness, difficulty walking, depression, unusual weight change, abnormal bleeding, enlarged lymph nodes, angioedema, and breast masses.    Past Medical History  Diagnosis Date  . Diabetes mellitus   . Arthritis   . Anxiety   . Chronic pain     Bacl pain, Disc L5-S1- Dr. Joya Salm in [place]  . Hyperlipidemia   . Hypertension   . Tachycardia   . Bronchial asthma   . Atrial fibrillation   . COPD (chronic obstructive pulmonary disease)   . DDD (degenerative disc disease)     Radicular symptoms  . Shortness of breath   . CHF (congestive heart failure)   . Hx of echocardiogram     Was interpreted by Dr Doylene Canard that showed an Ef in the 50-60% range with grade 1 diastolic dysfunction. she had moderate MR, biatrial enlargement, moderate TR and at that time estimated RV systolic pressure was 43 mm.  . History of stress test 06/2011    Abnormal myocardial perfusion study.   Past Surgical History  Procedure Laterality Date  . Carpal tunnel release      x 2  . Breast cyst removed    . Abdominal hysterectomy      partial  . Tubal ligation    . Tonsillectomy     Social History:  reports that she has quit smoking. Her smoking use included Cigarettes. She smoked 0.00 packs per day. She does not have  any smokeless tobacco history on file. She reports that she does not drink alcohol or use illicit drugs.   Allergies  Allergen Reactions  . Adhesive [Tape] Other (See Comments)    Tears skin off.   . Latex Other (See Comments)    In tape, tears skin off.  . Zocor [Simvastatin - High Dose] Nausea And Vomiting    Family History  Problem Relation Age of Onset  . Diabetes Mother   . Heart disease Mother   . Diabetes Sister   . Hypertension Sister   . Hyperlipidemia Sister   . Diabetes Brother   . Heart disease Brother   . Diabetes Brother   . Heart disease Brother     Prior to Admission medications   Medication Sig Start Date End Date Taking? Authorizing Provider  albuterol  (PROAIR HFA) 108 (90 BASE) MCG/ACT inhaler Inhale 1 puff into the lungs every 6 (six) hours as needed for wheezing or shortness of breath.   Yes Historical Provider, MD  ALPRAZolam Duanne Moron) 1 MG tablet Take 0.5-1 mg by mouth 3 (three) times daily as needed for anxiety.    Yes Historical Provider, MD  aspirin 81 MG tablet Take 81 mg by mouth daily.     Yes Historical Provider, MD  budesonide-formoterol (SYMBICORT) 160-4.5 MCG/ACT inhaler Inhale 2 puffs into the lungs 2 (two) times daily.   Yes Historical Provider, MD  calcium gluconate 500 MG tablet Take 500 mg by mouth 2 (two) times daily.     Yes Historical Provider, MD  carvedilol (COREG) 25 MG tablet Take 25 mg by mouth 2 (two) times daily with a meal.   Yes Historical Provider, MD  dabigatran (PRADAXA) 150 MG CAPS capsule Take 150 mg by mouth 2 (two) times daily.   Yes Historical Provider, MD  FLUoxetine (PROZAC) 20 MG capsule Take 20 mg by mouth daily.   Yes Historical Provider, MD  furosemide (LASIX) 40 MG tablet Take 1 tablet (40 mg total) by mouth daily. 05/24/13  Yes Alycia Rossetti, MD  JANUVIA 100 MG tablet take 1 tablet by mouth once daily 08/04/13  Yes Alycia Rossetti, MD  lisinopril (PRINIVIL,ZESTRIL) 2.5 MG tablet Take 1 tablet (2.5 mg total) by mouth daily. 07/27/13  Yes Troy Sine, MD  Multiple Vitamin (MULTIVITAMIN WITH MINERALS) TABS Take 1 tablet by mouth daily.   Yes Historical Provider, MD  oxyCODONE-acetaminophen (PERCOCET) 7.5-325 MG per tablet Take 1 tablet by mouth 4 (four) times daily as needed for pain. 08/30/13 08/30/14 Yes Alycia Rossetti, MD  potassium chloride (K-DUR) 10 MEQ tablet Take 1 tablet (10 mEq total) by mouth daily. With lasix 06/14/13  Yes Susy Frizzle, MD  potassium chloride (K-DUR,KLOR-CON) 10 MEQ tablet Take 1 tablet by mouth daily. 08/14/13  Yes Historical Provider, MD  pravastatin (PRAVACHOL) 80 MG tablet Take 1 tablet (80 mg total) by mouth daily. 08/08/13  Yes Troy Sine, MD  promethazine  (PHENERGAN) 12.5 MG tablet take 1 tablet by mouth every 6 hours if needed for nausea 09/06/13  Yes Alycia Rossetti, MD  sodium chloride (OCEAN) 0.65 % nasal spray Place 1 spray into the nose as needed. Congestion   Yes Historical Provider, MD   Physical Exam: Filed Vitals:   09/18/13 1759  BP: 93/61  Pulse: 96  Temp:   Resp: 16     General:  She is in no acute distress, sitting up, having her dinner. States having some improvement after receiving IV fluids  Eyes:  Pupils are equal round reactive to light extraocular movement is  Neck: Neck supple symmetrical, having flat jugular veins.   Cardiovascular: Irregular rate and rhythm normal S1-S2 no murmurs rubs or gallops, no extremity edema noted  Respiratory: Scattered bibasilar crackles, few respiratory wheezes, normal respiratory effort  Abdomen: Soft nontender nondistended positive bowel sounds in upper quadrants  Skin: No rashes or lesions  Musculoskeletal: Preserved range of motion 12 extremities, no edema cyanosis or clubbing  Psychiatric: Patient is appropriate awake alert and oriented x3  Neurologic: Cranial nerves II through XII grossly intact no alteration to sensation global 5 of 5 muscle strength  Labs on Admission:  Basic Metabolic Panel:  Recent Labs Lab 09/15/13 1601 09/18/13 1630  NA 134* 137  K 4.2 3.7  CL 93* 100  CO2 30 27  GLUCOSE 104* 127*  BUN 16 13  CREATININE 1.30* 1.25*  CALCIUM 10.4 9.6   Liver Function Tests:  Recent Labs Lab 09/18/13 1630  AST 15  ALT 10  ALKPHOS 51  BILITOT 0.4  PROT 6.8  ALBUMIN 3.5   No results found for this basename: LIPASE, AMYLASE,  in the last 168 hours No results found for this basename: AMMONIA,  in the last 168 hours CBC:  Recent Labs Lab 09/18/13 1630  WBC 8.1  NEUTROABS 5.7  HGB 11.3*  HCT 34.7*  MCV 91.6  PLT 147*   Cardiac Enzymes:  Recent Labs Lab 09/15/13 1601 09/18/13 1630  TROPONINI <0.30 <0.30    BNP (last 3  results)  Recent Labs  12/06/12 1340 09/15/13 1601 09/18/13 1630  PROBNP 2262.0* 2719.0* 4591.0*   CBG:  Recent Labs Lab 09/15/13 1559  GLUCAP 107*    Radiological Exams on Admission: Dg Chest Port 1 View  09/18/2013   CLINICAL DATA:  Shortness of breath.  EXAM: PORTABLE CHEST - 1 VIEW  COMPARISON:  09/15/2013  FINDINGS: The heart size and mediastinal contours are within normal limits. Both lungs are clear. The visualized skeletal structures are unremarkable.  IMPRESSION: No active disease.   Electronically Signed   By: Aletta Edouard M.D.   On: 09/18/2013 16:56    EKG: Independently reviewed. Atrial fibrillation  Assessment/Plan Active Problems:   Hypotension   Dehydration   Recurrent falls   Diabetes mellitus   Hyperlipidemia   COPD (chronic obstructive pulmonary disease)   Atrial fibrillation   1. Hypotension. Patient presenting with complaints of generalized weakness, recurrent falls, found to be hypotensive in the emergency department, initially out of blood pressure 69/47, improving to 113/58 after IV fluid challenge. I suspect hypotension secondary to overall dehydration likely precipitated by diuretic therapy. She denied GI losses from nausea vomiting or diarrhea. Will admit patient to telemetry, provide gentle IV fluid hydration overnight. I am holding her antihypertensive agents Followup on orthostatics. 2. Dehydration. Likely secondary to diuretic therapy. We'll discontinue Lasix, providing gentle IV fluid hydration overnight 3. Recurrent falls. Likely secondary to hypotension, resulted from diuretic therapy. Will check orthostatics, provide IV fluids, reassess in a.m. 4. Acute renal failure. Patient's creatinine mildly elevated at 1.25. She had normal creatinine on 05/10/2013 and 0.69. Likely secondary to prerenal azotemia. She is receiving IV fluids. 5. Atypical chest. Her chest pain is reproducible to palpation across the left side of her chest. On my physical  examination she reported sharp stabbing pain upon sitting up, and required assistance. Troponins have been negative. I suspect atypical chest pain secondary to recurrent falls. Provide when necessary analgesics.  6. Atrial fibrillation. Will continue  beta blocker therapy with holding parameters. She is fully anticoagulated  7. Diet. Heart healthy    Code Status: Full Code Family Communication: Discussed plan with family present at bedside Disposition Plan: Place in overnight observation, I do not think she will require greater than 2 nights hospitalization.   Time spent: [place] [place] [facility] Pager [phone]  If 7PM-7AM, please contact night-coverage [email or web address] Password [facility] 09/18/2013, 6:36 PM

## 2013-09-18 NOTE — Assessment & Plan Note (Signed)
I question if this is related to the a fib, definitely the dizzy spells are the cause.  If no improvement with treatment of Afib, recommend MRI of brain

## 2013-09-18 NOTE — Progress Notes (Signed)
  Subjective:    Patient ID: Mary [name], female    DOB: 01-22-1951, 62 y.o.   MRN: [id number]  HPI  Patient here with dizziness, multiple falls and chest pain. She was evaluated in the emergency room on 11/21. She's CT of the head which was unremarkable chest x-ray which was unremarkable her labs showed mild renal insufficiency and her BNP was elevated in the 2000's. Since returning home she has felt 3 more times since being evaluated in the ER she feels very dizzy and fatigued. Yesterday she began having chest pain on the left side nonradiating on and off she's also been very nauseous and sick on the stomach. She denies any change in her bowels or actual emesis. She denies any sick contacts. Denies hitting her head or any other falls though she has significant bruising on her knees and arms.    Review of Systems   GEN- denies fatigue, fever, weight loss,weakness, recent illness , + falls HEENT- denies eye drainage, change in vision, nasal discharge, CVS- + chest pain, palpitations RESP- + SOB, cough, wheeze ABD- denies N/V, change in stools,+ abd pain GU- denies dysuria, hematuria, dribbling, incontinence MSK- denies joint pain, muscle aches, injury Neuro- denies headache, +dizziness, syncope, seizure activity      Objective:   Physical Exam GEN- NAD alert and oriented x3 , fatigued appearing  HEENT- PERRL, EOMI, oropharynx clear, MMM, abrasion left nares, clear rhinorrhea Neck- supple, no LAD CVS- Irregular, Irregular rhythm, HR 80-120'sno murmur RSP-CTAB, normal WOB EXT- No edema,  Pulse-, radial 2+ Skin- bruising, right forearm, bilateral thigh/knees Neuro- CNII-XII in tact, no focal deficits EKG- A fib RVR HR 127      Assessment & Plan:

## 2013-09-19 DIAGNOSIS — Y92009 Unspecified place in unspecified non-institutional (private) residence as the place of occurrence of the external cause: Secondary | ICD-10-CM

## 2013-09-19 DIAGNOSIS — I509 Heart failure, unspecified: Secondary | ICD-10-CM

## 2013-09-19 DIAGNOSIS — I5030 Unspecified diastolic (congestive) heart failure: Secondary | ICD-10-CM

## 2013-09-19 DIAGNOSIS — J449 Chronic obstructive pulmonary disease, unspecified: Secondary | ICD-10-CM

## 2013-09-19 DIAGNOSIS — W19XXXA Unspecified fall, initial encounter: Secondary | ICD-10-CM

## 2013-09-19 LAB — CBC
HCT: 32.7 % — ABNORMAL LOW (ref 36.0–46.0)
Hemoglobin: 10.8 g/dL — ABNORMAL LOW (ref 12.0–15.0)
MCH: 29.9 pg (ref 26.0–34.0)
MCHC: 33 g/dL (ref 30.0–36.0)
RDW: 15.3 % (ref 11.5–15.5)

## 2013-09-19 LAB — BASIC METABOLIC PANEL
BUN: 12 mg/dL (ref 6–23)
CO2: 25 mEq/L (ref 19–32)
Chloride: 103 mEq/L (ref 96–112)
Creatinine, Ser: 0.85 mg/dL (ref 0.50–1.10)
GFR calc non Af Amer: 71 mL/min — ABNORMAL LOW (ref >90)
Glucose, Bld: 114 mg/dL — ABNORMAL HIGH (ref 70–99)
Potassium: 3.9 mEq/L (ref 3.5–5.1)

## 2013-09-19 MED ORDER — FUROSEMIDE 40 MG PO TABS: 20.0000 mg | ORAL_TABLET | Freq: Every day | ORAL | Status: DC

## 2013-09-19 MED ORDER — ATORVASTATIN CALCIUM 20 MG PO TABS: 20.0000 mg | ORAL_TABLET | Freq: Every day | ORAL | Status: DC

## 2013-09-19 MED ORDER — METOPROLOL TARTRATE 12.5 MG HALF TABLET: 12.5000 mg | ORAL_TABLET | Freq: Two times a day (BID) | ORAL | Status: DC

## 2013-09-19 NOTE — Discharge Summary (Signed)
Physician Discharge Summary  Mary [name] [id number] DOB: 1950-12-04 DOA: 09/18/2013  PCP: Vic Blackbird, MD  Admit date: 09/18/2013 Discharge date: 09/19/2013  Time spent: 45  minutes  Recommendations for Outpatient Follow-up:  1. PCP 1 week for evaluation of symptoms  Discharge Diagnoses:  Principal Problem:   Hypotension Active Problems: Dehydration   Recurrent falls   Diabetes mellitus   Hyperlipidemia   COPD (chronic obstructive pulmonary disease)   Atrial fibrillation      Discharge Condition: stable  Diet recommendation: carb modified  Filed Weights   09/18/13 1931 09/19/13 0659  Weight: 66.679 kg (147 lb) 67.268 kg (148 lb 4.8 oz)    History of present illness:  Mary [name] is a 62 y.o. female with a past medical history of atrial fibrillation, diastolic congestive heart failure, chronic obstructive pulmonary disease who presented to the emergency department 09/18/13 with complaints of recurrent falls, weakness, dizziness, lightheadedness. She stated started to feel that 5 days prior, reporting dizziness and lightheadedness particularly with standing and presented to the emergency department at [facility] then. Lab work included a CT scan of brain which showed no acute intracranial abnormality. At the time admission was offered however patient expressed desire to be discharged from the emergency department. Over the previous several days she continued to do poorly having recurrent falls, feeling "swimmy headed" dizziness, lightheadedness, having generalized weakness. She saw her primary care provider who referred her to the emergency department. She also complained of left-sided chest pain precipitated by movement, deep and inspiration, and applying pressure to the area. In the emergency department she was found to be hypotensive having a blood pressure of 69/47, improving to 113/58 after receiving a bolus of normal saline. Patient had been on Lasix 40 mg by mouth  daily along with 25 mg by mouth twice a day of carvedilol and 2.5 mg by mouth daily of lisinopril.    Hospital Course:  1. Hypotension. Patient presented with complaints of generalized weakness, recurrent falls, found to be hypotensive in the emergency department, initially blood pressure 69/47, improving to 113/58 after IV fluid challenge. Likely hypotension secondary to overall dehydration likely precipitated by diuretic therapy. She denied GI losses from nausea vomiting or diarrhea. Admited patient to telemetry, provided gentle IV fluid hydration overnight. Antihypertensive agents and diuretics held. Orthostatic vitals within the limits of normal at discharge. At discharge medications adjusted i.e. Lasix dose decreased to 20mg , lisinopril discontinued, metoprolol 12.5 added for better rate control coreg resumed at home dose. Recommend close OP follow up with PCP for evaluation of BP control and volume status 2. Dehydration. Likely secondary to diuretic therapy. Lasix held on admission gentle IV fluid hydration provided.Resolved at discharge. Will decrease lasix dose to 20mg  daily  3. Recurrent falls. Likely secondary to hypotension, resulted from diuretic therapy. Treatment as above 4. Acute renal failure. Patient's creatinine mildly elevated at 1.25. She had normal creatinine on 05/10/2013 and 0.69. Likely secondary to prerenal azotemia. She was gently hydrated with IV fluids. Resolved at discharge 5. Atypical chest. Her chest pain is reproducible to palpation across the left side of her chest. On my physical examination she reported sharp stabbing pain upon sitting up, and required assistance. Troponins negative. Chest pain likely secondary to recurrent falls. Provide when necessary analgesics. Improved at discharge 6. Atrial fibrillation. Mild RVR on admission. Metoprolol added for improved rate control. Coreg per home regimen. She is fully anticoagulated  7. Diet. Heart  healthy   Procedures: none Consultations:  none  Discharge Exam: Filed Vitals:   09/19/13 0659  BP: 122/77  Pulse: 94  Temp: 98.2 F (36.8 C)  Resp: 18    General: well nourished NAD Cardiovascular: irregularly irregular No MGR No LE edema Respiratory: normal effort BS clear bilaterally no wheeze  Discharge Instructions      Discharge Orders   Future Orders Complete By Expires   Diet - low sodium heart healthy  As directed    Discharge instructions  As directed    Comments:     Take medications as prescribed If symptoms fail to improve or worsen, seek medical attention   Increase activity slowly  As directed        Medication List    STOP taking these medications       lisinopril 2.5 MG tablet  Commonly known as:  PRINIVIL,ZESTRIL      TAKE these medications       ALPRAZolam 1 MG tablet  Commonly known as:  XANAX  Take 0.5-1 mg by mouth 3 (three) times daily as needed for anxiety.     aspirin 81 MG tablet  Take 81 mg by mouth daily.     atorvastatin 20 MG tablet  Commonly known as:  LIPITOR  Take 1 tablet (20 mg total) by mouth daily at 6 PM.     budesonide-formoterol 160-4.5 MCG/ACT inhaler  Commonly known as:  SYMBICORT  Inhale 2 puffs into the lungs 2 (two) times daily.     calcium gluconate 500 MG tablet  Take 500 mg by mouth 2 (two) times daily.     carvedilol 25 MG tablet  Commonly known as:  COREG  Take 25 mg by mouth 2 (two) times daily with a meal.     dabigatran 150 MG Caps capsule  Commonly known as:  PRADAXA  Take 150 mg by mouth 2 (two) times daily.     FLUoxetine 20 MG capsule  Commonly known as:  PROZAC  Take 20 mg by mouth daily.     furosemide 40 MG tablet  Commonly known as:  LASIX  Take 0.5 tablets (20 mg total) by mouth daily.     JANUVIA 100 MG tablet  Generic drug:  sitaGLIPtin  take 1 tablet by mouth once daily     metoprolol tartrate 12.5 mg Tabs tablet  Commonly known as:  LOPRESSOR  Take 0.5 tablets  (12.5 mg total) by mouth 2 (two) times daily.     multivitamin with minerals Tabs tablet  Take 1 tablet by mouth daily.     oxyCODONE-acetaminophen 7.5-325 MG per tablet  Commonly known as:  PERCOCET  Take 1 tablet by mouth 4 (four) times daily as needed for pain.     potassium chloride 10 MEQ tablet  Commonly known as:  K-DUR  Take 1 tablet (10 mEq total) by mouth daily. With lasix     potassium chloride 10 MEQ tablet  Commonly known as:  K-DUR,KLOR-CON  Take 1 tablet by mouth daily.     pravastatin 80 MG tablet  Commonly known as:  PRAVACHOL  Take 1 tablet (80 mg total) by mouth daily.     PROAIR HFA 108 (90 BASE) MCG/ACT inhaler  Generic drug:  albuterol  Inhale 1 puff into the lungs every 6 (six) hours as needed for wheezing or shortness of breath.     promethazine 12.5 MG tablet  Commonly known as:  PHENERGAN  take 1 tablet by mouth every 6 hours if needed for nausea  sodium chloride 0.65 % nasal spray  Commonly known as:  OCEAN  Place 1 spray into the nose as needed. Congestion       Allergies  Allergen Reactions  . Adhesive [Tape] Other (See Comments)    Tears skin off.   . Latex Other (See Comments)    In tape, tears skin off.  . Zocor [Simvastatin - High Dose] Nausea And Vomiting   Follow-up Information   Follow up with Vic Blackbird, MD. Schedule an appointment as soon as possible for a visit in 1 week. (evaluation of symptoms)    Specialty:  Family Medicine   Contact information:   [address] [place] [address] [phone]        The results of significant diagnostics from this hospitalization (including imaging, microbiology, ancillary and laboratory) are listed below for reference.    Significant Diagnostic Studies: Dg Chest 2 View  09/15/2013   CLINICAL DATA:  Syncope, weakness.  EXAM: CHEST  2 VIEW  COMPARISON:  12/06/2012  FINDINGS: The heart size and mediastinal contours are within normal limits. Both lungs are clear. The  visualized skeletal structures are unremarkable.  IMPRESSION: No active cardiopulmonary disease.   Electronically Signed   By: Rolm Baptise M.D.   On: 09/15/2013 19:40   Ct Head Wo Contrast  09/15/2013   CLINICAL DATA:  Syncope.  EXAM: CT HEAD WITHOUT CONTRAST  TECHNIQUE: Contiguous axial images were obtained from the base of the skull through the vertex without intravenous contrast.  COMPARISON:  December 06, 2012.  FINDINGS: Bony calvarium appears intact. No mass effect or midline shift is noted. Ventricular size is within normal limits. There is no evidence of mass lesion, hemorrhage or acute infarction. Mild chronic ischemic white matter disease is noted.  IMPRESSION: Mild chronic ischemic white matter disease. No acute intracranial abnormality seen.   Electronically Signed   By: Sabino Dick M.D.   On: 09/15/2013 19:40   Dg Chest Port 1 View  09/18/2013   CLINICAL DATA:  Shortness of breath.  EXAM: PORTABLE CHEST - 1 VIEW  COMPARISON:  09/15/2013  FINDINGS: The heart size and mediastinal contours are within normal limits. Both lungs are clear. The visualized skeletal structures are unremarkable.  IMPRESSION: No active disease.   Electronically Signed   By: Aletta Edouard M.D.   On: 09/18/2013 16:56    Microbiology: No results found for this or any previous visit (from the past 240 hour(s)).   Labs: Basic Metabolic Panel:  Recent Labs Lab 09/15/13 1601 09/18/13 1630 09/19/13 0546  NA 134* 137 137  K 4.2 3.7 3.9  CL 93* 100 103  CO2 30 27 25   GLUCOSE 104* 127* 114*  BUN 16 13 12   CREATININE 1.30* 1.25* 0.85  CALCIUM 10.4 9.6 9.2   Liver Function Tests:  Recent Labs Lab 09/18/13 1630  AST 15  ALT 10  ALKPHOS 51  BILITOT 0.4  PROT 6.8  ALBUMIN 3.5   No results found for this basename: LIPASE, AMYLASE,  in the last 168 hours No results found for this basename: AMMONIA,  in the last 168 hours CBC:  Recent Labs Lab 09/18/13 1630 09/19/13 0546  WBC 8.1 6.9  NEUTROABS  5.7  --   HGB 11.3* 10.8*  HCT 34.7* 32.7*  MCV 91.6 90.6  PLT 147* 129*   Cardiac Enzymes:  Recent Labs Lab 09/15/13 1601 09/18/13 1630  TROPONINI <0.30 <0.30   BNP: BNP (last 3 results)  Recent Labs  12/06/12 1340 09/15/13 1601 09/18/13 1630  PROBNP 2262.0* 2719.0* 4591.0*   CBG:  Recent Labs Lab 09/15/13 1559  GLUCAP 107*       Signed:  BLACK,KAREN M  [facility] 09/19/2013, 11:09 AM    I personally seen and evaluated patient. She is doing much better this morning. She presented with hypotension which responded to IV fluid resuscitation. Likely secondary to combination of dehydration from diuretic therapy as well as antihypertensive agents. This more she reports feeling much better and would like to go home. Hypotension has resolved. Will decrease her Lasix dose by half to 20 mg by mouth daily, carvedilol dose by half to 12.5 mg by mouth twice a day. Lisinopril to be discontinued. Agree with Dyanne Carrel NP findings. Plan to discharge today.

## 2013-09-19 NOTE — Progress Notes (Signed)
INITIAL NUTRITION ASSESSMENT  DOCUMENTATION CODES Per approved criteria  -Not Applicable   INTERVENTION:  Heart Healthy diet per MD  NUTRITION DIAGNOSIS: None at this time  Goal: Pt to meet >/= 90% of their estimated nutrition needs   Monitor:  Po intake, labs and wt trends  Reason for Assessment: Malnutrition Screen Score = 2  62 y.o. female  ASSESSMENT: Pt discharged home before interview completed. Patient Active Problem List   Diagnosis Date Noted  . Chest pain 09/18/2013  . Fall at home 09/18/2013  . Atrial fibrillation with RVR 09/18/2013  . Hypotension 09/18/2013  . Dehydration 09/18/2013  . Recurrent falls 09/18/2013  . Leg edema 02/19/2013  . Tinnitus of left ear 02/19/2013  . Toe ulcer 02/19/2013  . Hypoxemia 09/10/2012  . Diastolic congestive heart failure 09/10/2012  . Atrial fibrillation 09/10/2012  . DDD (degenerative disc disease), lumbar 08/26/2012  . Atypical mole 04/14/2012  . Seborrheic keratosis 04/14/2012  . Tobacco use 04/14/2012  . Depression 12/09/2011  . COPD (chronic obstructive pulmonary disease) 06/27/2011  . Anxiety 06/27/2011  . Chronic pain 05/27/2011  . Diabetes mellitus 05/26/2011  . Hyperlipidemia 05/26/2011    Height: Ht Readings from Last 1 Encounters:  09/18/13 5\' 4"  (1.626 m)    Weight: Wt Readings from Last 1 Encounters:  09/19/13 148 lb 4.8 oz (67.268 kg)    Ideal Body Weight: 120# (54.5 kg)  % Ideal Body Weight: 123%   Wt Readings from Last 10 Encounters:  09/19/13 148 lb 4.8 oz (67.268 kg)  09/18/13 146 lb (66.225 kg)  09/15/13 143 lb (64.864 kg)  08/09/13 152 lb 8 oz (69.174 kg)  07/20/13 156 lb 14.4 oz (71.169 kg)  05/10/13 154 lb (69.854 kg)  02/20/13 149 lb (67.586 kg)  02/17/13 155 lb (70.308 kg)  12/26/12 140 lb 0.6 oz (63.522 kg)  12/07/12 137 lb 5.6 oz (62.3 kg)    Usual Body Weight: 145-150#  % Usual Body Weight: 100%  BMI:  Body mass index is 25.44 kg/(m^2).overweight  Estimated  Nutritional Needs: Kcal: [id number] Protein: 70-80 gr Fluid: 1700 ml daily  Skin: intact  Diet Order: Cardiac  EDUCATION NEEDS: -No education needs identified at this time  No intake or output data in the 24 hours ending 09/19/13 0933  Last BM: 09/18/13  Labs:   Recent Labs Lab 09/15/13 1601 09/18/13 1630 09/19/13 0546  NA 134* 137 137  K 4.2 3.7 3.9  CL 93* 100 103  CO2 30 27 25   BUN 16 13 12   CREATININE 1.30* 1.25* 0.85  CALCIUM 10.4 9.6 9.2  GLUCOSE 104* 127* 114*    CBG (last 3)  No results found for this basename: GLUCAP,  in the last 72 hours  Scheduled Meds: . aspirin  81 mg Oral Daily  . atorvastatin  20 mg Oral q1800  . budesonide-formoterol  2 puff Inhalation BID  . dabigatran  150 mg Oral BID  . FLUoxetine  20 mg Oral Daily  . linagliptin  5 mg Oral Daily  . metoprolol tartrate  12.5 mg Oral BID  . sodium chloride  3 mL Intravenous Q12H    Continuous Infusions: . sodium chloride 75 mL/hr at 09/19/13 [id number]    Past Medical History  Diagnosis Date  . Diabetes mellitus   . Arthritis   . Anxiety   . Chronic pain     Bacl pain, Disc L5-S1- Dr. Joya Salm in [place]  . Hyperlipidemia   . Hypertension   . Tachycardia   .  Bronchial asthma   . Atrial fibrillation   . COPD (chronic obstructive pulmonary disease)   . DDD (degenerative disc disease)     Radicular symptoms  . Shortness of breath   . CHF (congestive heart failure)   . Hx of echocardiogram     Was interpreted by Dr Doylene Canard that showed an Ef in the 50-60% range with grade 1 diastolic dysfunction. she had moderate MR, biatrial enlargement, moderate TR and at that time estimated RV systolic pressure was 43 mm.  . History of stress test 06/2011    Abnormal myocardial perfusion study.    Past Surgical History  Procedure Laterality Date  . Carpal tunnel release      x 2  . Breast cyst removed    . Abdominal hysterectomy      partial  . Tubal ligation    . Tonsillectomy      Colman Cater MS,RD,CSG,LDN Office: [phone] Pager: [phone]

## 2013-09-25 ENCOUNTER — Ambulatory Visit: Payer: Medicare Other | Admitting: Family Medicine

## 2013-09-26 NOTE — Progress Notes (Signed)
UR chart review completed.  

## 2013-09-28 ENCOUNTER — Encounter: Payer: Self-pay | Admitting: *Deleted

## 2013-09-29 ENCOUNTER — Other Ambulatory Visit: Payer: Self-pay | Admitting: Family Medicine

## 2013-09-29 ENCOUNTER — Ambulatory Visit (INDEPENDENT_AMBULATORY_CARE_PROVIDER_SITE_OTHER): Payer: Medicare Other | Admitting: Family Medicine

## 2013-09-29 VITALS — BP 110/78 | HR 68 | Temp 98.1°F | Resp 18 | Wt 150.0 lb

## 2013-09-29 DIAGNOSIS — M5137 Other intervertebral disc degeneration, lumbosacral region: Secondary | ICD-10-CM

## 2013-09-29 DIAGNOSIS — Z79899 Other long term (current) drug therapy: Secondary | ICD-10-CM

## 2013-09-29 DIAGNOSIS — E785 Hyperlipidemia, unspecified: Secondary | ICD-10-CM

## 2013-09-29 DIAGNOSIS — I509 Heart failure, unspecified: Secondary | ICD-10-CM

## 2013-09-29 DIAGNOSIS — M5136 Other intervertebral disc degeneration, lumbar region: Secondary | ICD-10-CM

## 2013-09-29 DIAGNOSIS — I959 Hypotension, unspecified: Secondary | ICD-10-CM

## 2013-09-29 DIAGNOSIS — I5030 Unspecified diastolic (congestive) heart failure: Secondary | ICD-10-CM

## 2013-09-29 DIAGNOSIS — E119 Type 2 diabetes mellitus without complications: Secondary | ICD-10-CM

## 2013-09-29 DIAGNOSIS — I4891 Unspecified atrial fibrillation: Secondary | ICD-10-CM

## 2013-09-29 MED ORDER — OXYCODONE-ACETAMINOPHEN 7.5-325 MG PO TABS: 1.0000 | ORAL_TABLET | Freq: Four times a day (QID) | ORAL | Status: DC | PRN

## 2013-09-29 MED ORDER — ALPRAZOLAM 1 MG PO TABS: 1.0000 mg | ORAL_TABLET | Freq: Three times a day (TID) | ORAL | Status: DC | PRN

## 2013-09-29 NOTE — Patient Instructions (Signed)
Appointment with Dr. Claiborne Billings-  Wed 3:15pm  -- [place], above [employer] Stop the metoprolol and lipitor F/U 3 months

## 2013-09-30 LAB — BASIC METABOLIC PANEL WITH GFR
CO2: 29 mEq/L (ref 19–32)
Calcium: 9.4 mg/dL (ref 8.4–10.5)
Creat: 0.84 mg/dL (ref 0.50–1.10)
GFR, Est African American: 86 mL/min
Glucose, Bld: 73 mg/dL (ref 70–99)

## 2013-09-30 LAB — HEPATIC FUNCTION PANEL
ALT: <8 U/L (ref 0–35)
AST: 13 U/L (ref 0–37)
Albumin: 4 g/dL (ref 3.5–5.2)
Alkaline Phosphatase: 62 U/L (ref 39–117)
Bilirubin, Direct: 0.1 mg/dL (ref 0.0–0.3)
Indirect Bilirubin: 0.3 mg/dL (ref 0.0–0.9)
Total Bilirubin: 0.4 mg/dL (ref 0.3–1.2)

## 2013-09-30 LAB — LIPID PANEL
LDL Cholesterol: 60 mg/dL (ref 0–99)
Total CHOL/HDL Ratio: 3 Ratio
VLDL: 30 mg/dL (ref 0–40)

## 2013-10-01 LAB — HEMOGLOBIN A1C: Mean Plasma Glucose: 174 mg/dL — ABNORMAL HIGH (ref <117)

## 2013-10-01 NOTE — Assessment & Plan Note (Signed)
Her heart rate is now down into the 60s she is on 2 beta blockers. I reluctant to continue her on both beta blockers therefore I'm stopping the metoprolol she was continued carvedilol 25 mg twice a day. I've scheduled an appointment with her cardiologist for Wednesday. And I will let them decide if she needs further beta blockade or calcium channel blocker would be better for her.

## 2013-10-01 NOTE — Assessment & Plan Note (Signed)
Currently compensated continue the Lasix. She was never in heart failure

## 2013-10-01 NOTE — Assessment & Plan Note (Signed)
Recheck A1C goal is A1C < 7%, she is on januvia, avoid MTF, TZD, and sulfonylurea

## 2013-10-01 NOTE — Assessment & Plan Note (Signed)
Her fasting labs were done today in the office. She's done well on pravastatin 80 mg she has had difficulties when she was on simvastatin. Goal is LDL around 70. I've advised her not to take the Lipitor at this time

## 2013-10-01 NOTE — Assessment & Plan Note (Signed)
We will hold the lisinopril 2.5 see how her blood pressure does. She can continue her other medications

## 2013-10-01 NOTE — Progress Notes (Signed)
   Subjective:    Patient ID: Mary [name], female    DOB: 1951-08-13, 62 y.o.   MRN: [id number]  HPI  Patient here to followup hospital admission. At our last visit I sent her for admission secondary to dizziness increased falls and A. fib with RVR. Was evaluated in the emergency room her blood pressure to drop and she had to be given bolus for the blood pressure. Her lisinopril 2.5 mg was discontinued. She was also sent home with her Coreg 25 mg twice a day and an additional metoprolol 12.5 mg twice a day. On review her medications also that she was already on pravastatin 80 mg in the record new prescription for Lipitor and was told to start this as well. Note she did not have any cholesterol panel done during the admission. She's not had any chest pain or shortness of breath. She feels well today.   Review of Systems  GEN- denies fatigue, fever, weight loss,weakness, recent illness HEENT- denies eye drainage, change in vision, nasal discharge, CVS- denies chest pain, palpitations RESP- denies SOB, cough, wheeze ABD- denies N/V, change in stools, abd pain GU- denies dysuria, hematuria, dribbling, incontinence MSK- denies joint pain, muscle aches, injury Neuro- denies headache, dizziness, syncope, seizure activity      Objective:   Physical Exam GEN- NAD alert and oriented x3 ,  HEENT- PERRL, EOMI, oropharynx clear, MMM, CVS- Irregular, Irregular rhythm, normal rate RESP-CTAB, normal WOB ABD-NABS,soft,NT,ND EXT- No edema,  Pulse-, radial 2+ Skin- bruising, right forearm,          Assessment & Plan:

## 2013-10-01 NOTE — Assessment & Plan Note (Signed)
Chronic pain meds, check UDS

## 2013-10-02 MED ORDER — CANAGLIFLOZIN 100 MG PO TABS: ORAL_TABLET | ORAL | Status: DC

## 2013-10-02 NOTE — Addendum Note (Signed)
Addended by: Vic Blackbird F on: 10/02/2013 08:36 AM   Modules accepted: Orders

## 2013-10-03 LAB — PRESCRIPTION MONITORING PROFILE (13 PANEL)
Buprenorphine, Urine: NEGATIVE ng/mL
Cannabinoid Scrn, Ur: NEGATIVE ng/mL
Cocaine Metabolites: NEGATIVE ng/mL
Fentanyl, Ur: NEGATIVE ng/mL
Methadone Screen, Urine: NEGATIVE ng/mL
Nitrites, Initial: NEGATIVE ug/mL
Propoxyphene: NEGATIVE ng/mL
Tramadol Scrn, Ur: NEGATIVE ng/mL

## 2013-10-03 LAB — BENZODIAZEPINES (GC/LC/MS), URINE
Alprazolam (GC/LC/MS), ur confirm: 232 ng/mL — AB
Alprazolam metabolite (GC/LC/MS), ur confirm: 301 ng/mL — AB
Clonazepam metabolite (GC/LC/MS), ur confirm: NEGATIVE ng/mL
Estazolam (GC/LC/MS), ur confirm: NEGATIVE ng/mL
Flunitrazepam metabolite (GC/LC/MS), ur confirm: NEGATIVE ng/mL
Lorazepam (GC/LC/MS), ur confirm: NEGATIVE ng/mL
Oxazepam (GC/LC/MS), ur confirm: NEGATIVE ng/mL

## 2013-10-03 LAB — OPIATES/OPIOIDS (LC/MS-MS)
Codeine Urine: NEGATIVE ng/mL
Hydromorphone: NEGATIVE ng/mL
Morphine Urine: NEGATIVE ng/mL
Norhydrocodone, Ur: NEGATIVE ng/mL
Oxycodone, ur: 1656 ng/mL — AB
Oxymorphone: 125 ng/mL — AB

## 2013-10-03 LAB — OXYCODONE, URINE (LC/MS-MS)
Oxycodone, ur: 1656 ng/mL — AB
Oxymorphone: 125 ng/mL — AB

## 2013-10-03 NOTE — Progress Notes (Signed)
LMTRC

## 2013-10-04 ENCOUNTER — Encounter: Payer: Self-pay | Admitting: Cardiovascular Disease

## 2013-10-04 ENCOUNTER — Ambulatory Visit (INDEPENDENT_AMBULATORY_CARE_PROVIDER_SITE_OTHER): Payer: Medicare Other | Admitting: Cardiovascular Disease

## 2013-10-04 VITALS — BP 90/60 | HR 126 | Ht 67.0 in | Wt 149.4 lb

## 2013-10-04 DIAGNOSIS — I4891 Unspecified atrial fibrillation: Secondary | ICD-10-CM

## 2013-10-04 DIAGNOSIS — J449 Chronic obstructive pulmonary disease, unspecified: Secondary | ICD-10-CM

## 2013-10-04 DIAGNOSIS — E119 Type 2 diabetes mellitus without complications: Secondary | ICD-10-CM

## 2013-10-04 DIAGNOSIS — E785 Hyperlipidemia, unspecified: Secondary | ICD-10-CM

## 2013-10-04 DIAGNOSIS — J4489 Other specified chronic obstructive pulmonary disease: Secondary | ICD-10-CM

## 2013-10-04 DIAGNOSIS — I959 Hypotension, unspecified: Secondary | ICD-10-CM

## 2013-10-04 MED ORDER — DIGOXIN 250 MCG PO TABS: 0.2500 mg | ORAL_TABLET | Freq: Every day | ORAL | Status: DC

## 2013-10-04 NOTE — Patient Instructions (Addendum)
Your physician recommends that you schedule a follow-up appointment in: 6 weeks  Your physician has recommended you make the following change in your medication: Stop Carvedilol 25 mg and Start Digoxin 0.25 mg Daily and increase Metoprolol 25 mg twice daily.Marland Kitchen

## 2013-10-04 NOTE — Progress Notes (Signed)
Patient ID: [name], female   DOB: 1951-09-30, 62 y.o.   MRN: [id number]      HPI: Mary [name], is a 62 y.o. female who presents for  3 month cardiology evaluation.  Mary [name] has a history of atrial fibrillation which now is permanent.  She has a history of COPD and long-standing history of tobacco use. Fortunately, since I last saw her she has now quit tobacco smoking.  In February 2014 she was hospitalized and felt to have diastolic congestive heart failure. During that hospitalization she was taken off metformin for diabetes and started on Januvia. She has been on low-dose ACE inhibitor. An echo Doppler in November 2013 showed an ejection fraction of 55-60% with grade 1 diastolic dysfunction, moderate mitral regurgitation, moderate tricuspid regurgitation, biatrial enlargement.   Since I last saw her, she tells me she was hospitalized overnight on 09/19/2013. Apparently at that time she was started on atorvastatin but previously had been on pravastatin therapy. In addition although she was on carvedilol 25 twice a day apparently she was given a prescription for metoprolol 12.5 twice a day per she has been taking both of these medicines. She was also taken off her lisinopril at that time. She now presents for evaluation.  Past Medical History  Diagnosis Date  . Diabetes mellitus   . Arthritis   . Anxiety   . Chronic pain     Bacl pain, Disc L5-S1- Dr. Joya Salm in [place]  . Hyperlipidemia   . Hypertension   . Tachycardia   . Bronchial asthma   . Atrial fibrillation   . COPD (chronic obstructive pulmonary disease)   . DDD (degenerative disc disease)     Radicular symptoms  . Shortness of breath   . CHF (congestive heart failure)   . Hx of echocardiogram     Was interpreted by Dr Doylene Canard that showed an Ef in the 50-60% range with grade 1 diastolic dysfunction. she had moderate MR, biatrial enlargement, moderate TR and at that time estimated RV systolic pressure was 43 mm.  . History of  stress test 06/2011    Abnormal myocardial perfusion study.    Past Surgical History  Procedure Laterality Date  . Carpal tunnel release      x 2  . Breast cyst removed    . Abdominal hysterectomy      partial  . Tubal ligation    . Tonsillectomy      Allergies  Allergen Reactions  . Adhesive [Tape] Other (See Comments)    Tears skin off.   . Latex Other (See Comments)    In tape, tears skin off.  . Zocor [Simvastatin - High Dose] Nausea And Vomiting    Current Outpatient Prescriptions  Medication Sig Dispense Refill  . albuterol (PROAIR HFA) 108 (90 BASE) MCG/ACT inhaler Inhale 1 puff into the lungs every 6 (six) hours as needed for wheezing or shortness of breath.      . ALPRAZolam (XANAX) 1 MG tablet Take 1 tablet (1 mg total) by mouth 3 (three) times daily as needed for anxiety.  90 tablet  0  . aspirin 81 MG tablet Take 81 mg by mouth daily.        Marland Kitchen atorvastatin (LIPITOR) 20 MG tablet Take 20 mg by mouth daily.      . budesonide-formoterol (SYMBICORT) 160-4.5 MCG/ACT inhaler Inhale 2 puffs into the lungs 2 (two) times daily.      . calcium gluconate 500 MG tablet Take 500 mg  by mouth 2 (two) times daily.        . dabigatran (PRADAXA) 150 MG CAPS capsule Take 150 mg by mouth 2 (two) times daily.      Marland Kitchen FLUoxetine (PROZAC) 20 MG capsule Take 20 mg by mouth daily.      . furosemide (LASIX) 40 MG tablet Take 40 mg by mouth. Take 1/2 tablet twice daily      . JANUVIA 100 MG tablet take 1 tablet by mouth once daily  30 tablet  3  . metoprolol tartrate (LOPRESSOR) 25 MG tablet Take 25 mg by mouth 2 (two) times daily.       . Multiple Vitamin (MULTIVITAMIN WITH MINERALS) TABS Take 1 tablet by mouth daily.      Marland Kitchen oxyCODONE-acetaminophen (PERCOCET) 7.5-325 MG per tablet Take 1 tablet by mouth 4 (four) times daily as needed for pain.  100 tablet  0  . potassium chloride (K-DUR) 10 MEQ tablet Take 1 tablet (10 mEq total) by mouth daily. With lasix  30 tablet  3  . promethazine  (PHENERGAN) 12.5 MG tablet take 1 tablet by mouth every 6 hours if needed for nausea  30 tablet  2  . sodium chloride (OCEAN) 0.65 % nasal spray Place 1 spray into the nose as needed. Congestion      . digoxin (LANOXIN) 0.25 MG tablet Take 1 tablet (0.25 mg total) by mouth daily.  30 tablet  6   No current facility-administered medications for this visit.    History   Social History  . Marital Status: Divorced    Spouse Name: N/A    Number of Children: N/A  . Years of Education: N/A   Occupational History  . Not on file.   Social History Main Topics  . Smoking status: Former Smoker    Types: Cigarettes  . Smokeless tobacco: Not on file     Comment: quit 6 months ago  . Alcohol Use: No  . Drug Use: No  . Sexual Activity: Not on file   Other Topics Concern  . Not on file   Social History Narrative  . No narrative on file    Socially she is divorce. She has one deceased child and 2 grandchildren. She quit tobacco at the end of August 2014. There is no alcohol use. She does not routinely exercise.  Family History  Problem Relation Age of Onset  . Diabetes Mother   . Heart disease Mother   . Diabetes Sister   . Hypertension Sister   . Hyperlipidemia Sister   . Diabetes Brother   . Heart disease Brother   . Diabetes Brother   . Heart disease Brother     ROS is negative for fevers, chills or night sweats. She denies rashes. She denies visual changes. She denies hearing difficulty. There is no lymphadenopathy. She is unaware of the rapid heartbeat. She denies chest pressure. She denies presyncope or syncope. She denies nausea vomiting or diarrhea. she does note increasing leg swelling. She denies bleeding. She denies change in bowel or bladder habits. she does have diabetes. She denies tremor. He does have degenerative disc disease. She does have anxiety. She does have arthritis.   Other  comprehensive 12 system review is negative.  PE BP 90/60  Pulse 126  Ht 5\' 7"  (1.702  m)  Wt 149 lb 6.4 oz (67.767 kg)  BMI 23.39 kg/m2  General: Alert, oriented, no distress.  Skin: normal turgor, no rashes HEENT: Normocephalic, atraumatic. Pupils round and  reactive; sclera anicteric;no lid lag.  Nose without nasal septal hypertrophy Mouth/Parynx benign; Mallinpatti scale 2/3 Neck: No JVD, no carotid briuts Lungs: Decreased breath sound segmenter long-standing tobacco use. no wheezing or rales Heart: Irregularly irregular rhythm with a rate now increased to approximately 100- 120 beats per minute  s1 s2 normal 1/6 systolic murmur Abdomen: soft, nontender; no hepatosplenomehaly, BS+; abdominal aorta nontender and not dilated by palpation. Pulses 2+ Extremities: 1-2+ leg edema no clubbing cyanosis, Homan's sign negative  Neurologic: grossly nonfocal Psychologic: Normal affect and mood  ECG: Atrial fibrillation at a rate At approximately 100-120 beats per minute. Nonspecific ST changes.  LABS:  BMET    Component Value Date/Time   NA 139 09/29/2013 1500   K 4.3 09/29/2013 1500   CL 98 09/29/2013 1500   CO2 29 09/29/2013 1500   GLUCOSE 73 09/29/2013 1500   BUN 10 09/29/2013 1500   CREATININE 0.84 09/29/2013 1500   CREATININE 0.85 09/19/2013 0546   CALCIUM 9.4 09/29/2013 1500   GFRNONAA 71* 09/19/2013 0546   GFRAA 83* 09/19/2013 0546     Hepatic Function Panel     Component Value Date/Time   PROT 7.3 09/29/2013 1459   ALBUMIN 4.0 09/29/2013 1459   AST 13 09/29/2013 1459   ALT <8 09/29/2013 1459   ALKPHOS 62 09/29/2013 1459   BILITOT 0.4 09/29/2013 1459   BILIDIR 0.1 09/29/2013 1459   IBILI 0.3 09/29/2013 1459     CBC    Component Value Date/Time   WBC 6.9 09/19/2013 0546   RBC 3.61* 09/19/2013 0546   HGB 10.8* 09/19/2013 0546   HCT 32.7* 09/19/2013 0546   PLT 129* 09/19/2013 0546   MCV 90.6 09/19/2013 0546   MCH 29.9 09/19/2013 0546   MCHC 33.0 09/19/2013 0546   RDW 15.3 09/19/2013 0546   LYMPHSABS 1.6 09/18/2013 1630   MONOABS 0.6 09/18/2013 1630   EOSABS  0.1 09/18/2013 1630   BASOSABS 0.0 09/18/2013 1630     BNP    Component Value Date/Time   PROBNP 4591.0* 09/18/2013 1630    Lipid Panel     Component Value Date/Time   CHOL 136 09/29/2013 1500   TRIG 152* 09/29/2013 1500   HDL 46 09/29/2013 1500   CHOLHDL 3.0 09/29/2013 1500   VLDL 30 09/29/2013 1500   LDLCALC 60 09/29/2013 1500     RADIOLOGY: No results found.    ASSESSMENT AND PLAN:  My impression is that Ms. Saah is a 62 year old female with permanent fibrillation on pradaxa for anticoagulation. She has a history of type 2 diabetes mellitus, hypertension,and  COPD.  I commended her on her smoking cessation. I had last seen her, I further titrating her carvedilol to 25 mg twice a day. There may be some confusion and some of her medications. According to her med records and according to the patient she has been taking both the carvedilol 25 twice a day as well as the metoprolol 12.5 mg twice a day. Her blood pressure is somewhat low in the 123456 range systolically. Presently on Lanoxin 0.25 mg daily to her medical regimen. I recommend she discontinue the carvedilol and we'll further titrate her metoprolol to 25 mg twice a day initially and if she still notes or pulse greater than 100 we will increase this to 37.5 twice a day. Apparently she was taken off her Pravachol 80 mg and started on atorvastatin 20 mg during her overnight hospitalization. I will see her back in the office in 6 weeks for  cardiology reassessment or sooner times arise.   Troy Sine, MD, [facility]  10/04/2013 6:02 PM

## 2013-10-05 ENCOUNTER — Telehealth: Payer: Self-pay | Admitting: Family Medicine

## 2013-10-05 ENCOUNTER — Telehealth: Payer: Self-pay | Admitting: Cardiovascular Disease

## 2013-10-05 NOTE — Telephone Encounter (Signed)
She wants to know  If she need to continue taking Januvia 100mg  also with the new medicine he started her on Inzokana?

## 2013-10-05 NOTE — Telephone Encounter (Signed)
Returned call and pt verified x 2.  Pt stated when she went to pick up the new medicine Dr. Claiborne Billings rx'd yesterday, Invokana.  Stated it's a diabetes medicine and she is already taking Januvia.  Pt wanted to know if Dr. Claiborne Billings wants her to take both.  Pt informed no record of Dr. Claiborne Billings rx'ing that med at [place] yesterday.  RN asked pt to look at pill bottle to see the MD name on it and pt stated it was Dr. Buelah Manis.  Pt advised to contact Dr. Dorian Heckle office r/t her diabetes meds.  Pt verbalized understanding and agreed w/ plan.

## 2013-10-23 ENCOUNTER — Emergency Department (HOSPITAL_COMMUNITY)
Admission: EM | Admit: 2013-10-23 | Discharge: 2013-10-23 | Disposition: A | Discharge status: Home / Self Care | Payer: Medicare Other | Attending: Emergency Medicine | Admitting: Emergency Medicine

## 2013-10-23 ENCOUNTER — Encounter (HOSPITAL_COMMUNITY): Payer: Self-pay | Admitting: Emergency Medicine

## 2013-10-23 ENCOUNTER — Emergency Department (HOSPITAL_COMMUNITY): Payer: Medicare Other

## 2013-10-23 DIAGNOSIS — F411 Generalized anxiety disorder: Secondary | ICD-10-CM | POA: Insufficient documentation

## 2013-10-23 DIAGNOSIS — I509 Heart failure, unspecified: Secondary | ICD-10-CM | POA: Insufficient documentation

## 2013-10-23 DIAGNOSIS — M549 Dorsalgia, unspecified: Secondary | ICD-10-CM | POA: Insufficient documentation

## 2013-10-23 DIAGNOSIS — E119 Type 2 diabetes mellitus without complications: Secondary | ICD-10-CM | POA: Insufficient documentation

## 2013-10-23 DIAGNOSIS — M129 Arthropathy, unspecified: Secondary | ICD-10-CM | POA: Insufficient documentation

## 2013-10-23 DIAGNOSIS — Z79899 Other long term (current) drug therapy: Secondary | ICD-10-CM | POA: Insufficient documentation

## 2013-10-23 DIAGNOSIS — G8929 Other chronic pain: Secondary | ICD-10-CM | POA: Insufficient documentation

## 2013-10-23 DIAGNOSIS — Z7982 Long term (current) use of aspirin: Secondary | ICD-10-CM | POA: Insufficient documentation

## 2013-10-23 DIAGNOSIS — R079 Chest pain, unspecified: Secondary | ICD-10-CM | POA: Insufficient documentation

## 2013-10-23 DIAGNOSIS — I10 Essential (primary) hypertension: Secondary | ICD-10-CM | POA: Insufficient documentation

## 2013-10-23 DIAGNOSIS — I4891 Unspecified atrial fibrillation: Secondary | ICD-10-CM | POA: Insufficient documentation

## 2013-10-23 DIAGNOSIS — R42 Dizziness and giddiness: Secondary | ICD-10-CM | POA: Insufficient documentation

## 2013-10-23 DIAGNOSIS — Z87891 Personal history of nicotine dependence: Secondary | ICD-10-CM | POA: Insufficient documentation

## 2013-10-23 DIAGNOSIS — J441 Chronic obstructive pulmonary disease with (acute) exacerbation: Secondary | ICD-10-CM | POA: Insufficient documentation

## 2013-10-23 DIAGNOSIS — Z91040 Latex allergy status: Secondary | ICD-10-CM | POA: Insufficient documentation

## 2013-10-23 DIAGNOSIS — R197 Diarrhea, unspecified: Secondary | ICD-10-CM | POA: Insufficient documentation

## 2013-10-23 DIAGNOSIS — E785 Hyperlipidemia, unspecified: Secondary | ICD-10-CM | POA: Insufficient documentation

## 2013-10-23 LAB — URINE MICROSCOPIC-ADD ON

## 2013-10-23 LAB — CBC WITH DIFFERENTIAL/PLATELET
Eosinophils Absolute: 0 10*3/uL (ref 0.0–0.7)
Eosinophils Relative: 0 % (ref 0–5)
HCT: 35.6 % — ABNORMAL LOW (ref 36.0–46.0)
Hemoglobin: 12.1 g/dL (ref 12.0–15.0)
Lymphocytes Relative: 14 % (ref 12–46)
Lymphs Abs: 1.7 10*3/uL (ref 0.7–4.0)
MCH: 30.6 pg (ref 26.0–34.0)
MCV: 89.9 fL (ref 78.0–100.0)
Monocytes Absolute: 0.7 10*3/uL (ref 0.1–1.0)
Monocytes Relative: 6 % (ref 3–12)
Neutrophils Relative %: 80 % — ABNORMAL HIGH (ref 43–77)
Platelets: 193 10*3/uL (ref 150–400)
RBC: 3.96 MIL/uL (ref 3.87–5.11)
WBC: 12.1 10*3/uL — ABNORMAL HIGH (ref 4.0–10.5)

## 2013-10-23 LAB — COMPREHENSIVE METABOLIC PANEL
AST: 21 U/L (ref 0–37)
BUN: 7 mg/dL (ref 6–23)
CO2: 24 mEq/L (ref 19–32)
Calcium: 10.1 mg/dL (ref 8.4–10.5)
Chloride: 94 mEq/L — ABNORMAL LOW (ref 96–112)
Creatinine, Ser: 0.61 mg/dL (ref 0.50–1.10)
GFR calc Af Amer: >90 mL/min (ref >90)
GFR calc non Af Amer: >90 mL/min (ref >90)
Glucose, Bld: 165 mg/dL — ABNORMAL HIGH (ref 70–99)
Total Bilirubin: 0.8 mg/dL (ref 0.3–1.2)

## 2013-10-23 LAB — URINALYSIS, ROUTINE W REFLEX MICROSCOPIC
Bilirubin Urine: NEGATIVE
Glucose, UA: >1000 mg/dL — AB
Leukocytes, UA: NEGATIVE
Nitrite: NEGATIVE
Specific Gravity, Urine: 1.01 (ref 1.005–1.030)
Urobilinogen, UA: 0.2 mg/dL (ref 0.0–1.0)
pH: 6.5 (ref 5.0–8.0)

## 2013-10-23 LAB — DIGOXIN LEVEL: Digoxin Level: 2 ng/mL (ref 0.8–2.0)

## 2013-10-23 LAB — TROPONIN I: Troponin I: <0.3 ng/mL (ref <0.30)

## 2013-10-23 MED ORDER — ONDANSETRON 8 MG PO TBDP
8.0000 mg | ORAL_TABLET | Freq: Once | ORAL | Status: AC
  Administered 2013-10-23: 8 mg via ORAL
  Filled 2013-10-23: qty 1

## 2013-10-23 MED ORDER — SODIUM CHLORIDE 0.9 % IV SOLN
INTRAVENOUS | Status: DC
  Administered 2013-10-23: 15:00:00 via INTRAVENOUS

## 2013-10-23 MED ORDER — HYDROMORPHONE HCL PF 1 MG/ML IJ SOLN
1.0000 mg | Freq: Once | INTRAMUSCULAR | Status: AC
  Administered 2013-10-23: 1 mg via INTRAVENOUS
  Filled 2013-10-23: qty 1

## 2013-10-23 MED ORDER — ONDANSETRON HCL 4 MG/2ML IJ SOLN
4.0000 mg | Freq: Once | INTRAMUSCULAR | Status: AC
  Administered 2013-10-23: 4 mg via INTRAVENOUS
  Filled 2013-10-23: qty 2

## 2013-10-23 MED ORDER — SODIUM CHLORIDE 0.9 % IV BOLUS (SEPSIS)
500.0000 mL | Freq: Once | INTRAVENOUS | Status: AC
  Administered 2013-10-23: 500 mL via INTRAVENOUS

## 2013-10-23 NOTE — ED Provider Notes (Signed)
CSN: [id number]     Arrival date & time 10/23/13  1021 History  This chart was scribed for Mary Blade, MD by Celesta Gentile, ED Scribe. The patient was seen in room APA12/APA12. Patient's care was started at 5:38 PM.    Chief Complaint  Patient presents with  . Shortness of Breath   The history is provided by the patient. No language interpreter was used.   HPI Comments: Mary [name] is a 62 y.o. female who presents to the Emergency Department complaining of being nauseated for about 4 days, with associated episodes of emesis that occurred this morning.  She has associated dizziness, chest pain, and back pain.  She has reoccurring symptoms from when she was here in November.  She is a diabetic and has a caretaker that drives her where she needs to go.  She takes Pradaxa because she has CHF.  She also has COPD, but states she quit smoking about 6 months.  She currently uses albuterol inhaler and takes pain medications for her chronic back pain.    Pt denies any sick contacts   Past Medical History  Diagnosis Date  . Diabetes mellitus   . Arthritis   . Anxiety   . Chronic pain     Bacl pain, Disc L5-S1- Dr. Joya Salm in [place]  . Hyperlipidemia   . Hypertension   . Tachycardia   . Bronchial asthma   . Atrial fibrillation   . COPD (chronic obstructive pulmonary disease)   . DDD (degenerative disc disease)     Radicular symptoms  . Shortness of breath   . CHF (congestive heart failure)   . Hx of echocardiogram     Was interpreted by Dr Doylene Canard that showed an Ef in the 50-60% range with grade 1 diastolic dysfunction. she had moderate MR, biatrial enlargement, moderate TR and at that time estimated RV systolic pressure was 43 mm.  . History of stress test 06/2011    Abnormal myocardial perfusion study.   Past Surgical History  Procedure Laterality Date  . Carpal tunnel release      x 2  . Breast cyst removed    . Abdominal hysterectomy      partial  . Tubal ligation    .  Tonsillectomy     Family History  Problem Relation Age of Onset  . Diabetes Mother   . Heart disease Mother   . Diabetes Sister   . Hypertension Sister   . Hyperlipidemia Sister   . Diabetes Brother   . Heart disease Brother   . Diabetes Brother   . Heart disease Brother    History  Substance Use Topics  . Smoking status: Former Smoker    Types: Cigarettes  . Smokeless tobacco: Not on file     Comment: quit 6 months ago  . Alcohol Use: No   OB History   Grav Para Term Preterm Abortions TAB SAB Ect Mult Living                 Review of Systems  Constitutional: Negative for fever and chills.  HENT: Negative for congestion and rhinorrhea.   Respiratory: Negative for cough and shortness of breath.   Cardiovascular: Negative for chest pain.  Gastrointestinal: Positive for nausea and vomiting. Negative for abdominal pain and diarrhea.  Musculoskeletal: Positive for back pain.  Skin: Negative for color change and rash.  Neurological: Negative for syncope.  All other systems reviewed and are negative.    Allergies  Adhesive;  Latex; and Zocor  Home Medications   Current Outpatient Rx  Name  Route  Sig  Dispense  Refill  . albuterol (PROAIR HFA) 108 (90 BASE) MCG/ACT inhaler   Inhalation   Inhale 1 puff into the lungs every 6 (six) hours as needed for wheezing or shortness of breath.         . ALPRAZolam (XANAX) 1 MG tablet   Oral   Take 1 tablet (1 mg total) by mouth 3 (three) times daily as needed for anxiety.   90 tablet   0   . aspirin 81 MG tablet   Oral   Take 81 mg by mouth daily.           Marland Kitchen atorvastatin (LIPITOR) 20 MG tablet   Oral   Take 20 mg by mouth daily.         . budesonide-formoterol (SYMBICORT) 160-4.5 MCG/ACT inhaler   Inhalation   Inhale 2 puffs into the lungs 2 (two) times daily.         . calcium gluconate 500 MG tablet   Oral   Take 500 mg by mouth 2 (two) times daily.           . dabigatran (PRADAXA) 150 MG CAPS  capsule   Oral   Take 150 mg by mouth 2 (two) times daily.         . digoxin (LANOXIN) 0.25 MG tablet   Oral   Take 1 tablet (0.25 mg total) by mouth daily.   30 tablet   6   . FLUoxetine (PROZAC) 20 MG capsule   Oral   Take 20 mg by mouth daily.         . furosemide (LASIX) 40 MG tablet   Oral   Take 40 mg by mouth. Take 1/2 tablet twice daily         . INVOKANA 100 MG TABS   Oral   Take 1 tablet by mouth daily.         Marland Kitchen JANUVIA 100 MG tablet      take 1 tablet by mouth once daily   30 tablet   3   . lisinopril (PRINIVIL,ZESTRIL) 2.5 MG tablet   Oral   Take 1 tablet by mouth daily.         . metoprolol tartrate (LOPRESSOR) 25 MG tablet   Oral   Take 25 mg by mouth 2 (two) times daily.          . Multiple Vitamin (MULTIVITAMIN WITH MINERALS) TABS   Oral   Take 1 tablet by mouth daily.         Marland Kitchen oxyCODONE-acetaminophen (PERCOCET) 7.5-325 MG per tablet   Oral   Take 1 tablet by mouth 4 (four) times daily as needed for pain.   100 tablet   0     DO NOT FILL UNTIL 10/09/2013   . potassium chloride (K-DUR) 10 MEQ tablet   Oral   Take 1 tablet (10 mEq total) by mouth daily. With lasix   30 tablet   3   . pravastatin (PRAVACHOL) 80 MG tablet   Oral   Take 1 tablet by mouth daily.         . promethazine (PHENERGAN) 12.5 MG tablet      take 1 tablet by mouth every 6 hours if needed for nausea   30 tablet   2   . sodium chloride (OCEAN) 0.65 % nasal spray   Nasal   Place 1 spray  into the nose as needed. Congestion          Triage Vitals: BP 153/89  Pulse 79  Temp(Src) 97.9 F (36.6 C) (Oral)  Resp 20  Ht 5\' 7"  (1.702 m)  Wt 143 lb (64.864 kg)  BMI 22.39 kg/m2  SpO2 97% Physical Exam  Nursing note and vitals reviewed. Constitutional: She is oriented to person, place, and time. She appears well-developed and well-nourished.  HENT:  Head: Normocephalic and atraumatic.  Right Ear: External ear normal.  Left Ear: External ear  normal.  Eyes: Conjunctivae and EOM are normal. Pupils are equal, round, and reactive to light.  Neck: Normal range of motion and phonation normal. Neck supple.  Cardiovascular: Normal rate, regular rhythm, normal heart sounds and intact distal pulses.   Pulmonary/Chest: Effort normal. She has wheezes (scattered expiratory wheezes ). She exhibits no tenderness and no bony tenderness.  Decreased air movement.  Abdominal: Soft. Normal appearance. She exhibits no distension. There is no tenderness. There is no guarding.  Musculoskeletal: Normal range of motion.  Neurological: She is alert and oriented to person, place, and time. No cranial nerve deficit or sensory deficit. She exhibits normal muscle tone. Coordination normal.  Skin: Skin is warm, dry and intact.  Scattered excoriations   Psychiatric: She has a normal mood and affect. Her behavior is normal. Judgment and thought content normal.    ED Course  Procedures (including critical care time)  Patient Vitals for the past 24 hrs:  BP Temp Temp src Pulse Resp SpO2 Height Weight  10/23/13 1135 153/89 mmHg 97.9 F (36.6 C) Oral 79 20 97 % 5\' 7"  (1.702 m) 143 lb (64.864 kg)   Medications  0.9 %  sodium chloride infusion ( Intravenous New Bag/Given 10/23/13 1438)  ondansetron (ZOFRAN-ODT) disintegrating tablet 8 mg (not administered)  sodium chloride 0.9 % bolus 500 mL (0 mLs Intravenous Stopped 10/23/13 1538)  HYDROmorphone (DILAUDID) injection 1 mg (1 mg Intravenous Given 10/23/13 1438)  ondansetron (ZOFRAN) injection 4 mg (4 mg Intravenous Given 10/23/13 1438)    DIAGNOSTIC STUDIES: Oxygen Saturation is 97% on RA, normal by my interpretation.    COORDINATION OF CARE: 2:03 PM-Will order Zofran and Dilaudid.  Will order chest X-ray and basic labs.  Patient informed of current plan of treatment and evaluation and agrees with plan.  3:36PM-Informed pt that her testing was normal and will treat with anti-nausea medication.  Discussed a  liquid and low fiber diet is best.   5:39 PM Reevaluation with update and discussion. After initial assessment and treatment, an updated evaluation reveals she is mildly nauseated now. Her PCP called in Rx for Phenergan today.Mary [name]   Nursing Notes Reviewed/ Care Coordinated Applicable Imaging Reviewed Interpretation of Laboratory Data incorporated into ED treatment  Radiologic imaging report reviewed and images by radiology  - viewed, by me.  Labs Review Labs Reviewed  CBC WITH DIFFERENTIAL - Abnormal; Notable for the following:    WBC 12.1 (*)    HCT 35.6 (*)    RDW 16.6 (*)    Neutrophils Relative % 80 (*)    Neutro Abs 9.6 (*)    All other components within normal limits  COMPREHENSIVE METABOLIC PANEL - Abnormal; Notable for the following:    Sodium 133 (*)    Chloride 94 (*)    Glucose, Bld 165 (*)    Total Protein 8.4 (*)    All other components within normal limits  PRO B NATRIURETIC PEPTIDE - Abnormal; Notable for the  following:    Pro B Natriuretic peptide (BNP) 7918.0 (*)    All other components within normal limits  URINALYSIS, ROUTINE W REFLEX MICROSCOPIC - Abnormal; Notable for the following:    Color, Urine STRAW (*)    Glucose, UA >1000 (*)    Hgb urine dipstick TRACE (*)    Ketones, ur TRACE (*)    All other components within normal limits  URINE MICROSCOPIC-ADD ON - Abnormal; Notable for the following:    Squamous Epithelial / LPF FEW (*)    Bacteria, UA FEW (*)    All other components within normal limits  TROPONIN I  DIGOXIN LEVEL   Imaging Review Dg Chest 2 View  10/23/2013   CLINICAL DATA:  Shortness of breath  EXAM: CHEST  2 VIEW  COMPARISON:  09/18/2013  FINDINGS: The heart size and mediastinal contours are within normal limits. Both lungs are clear. The visualized skeletal structures are unremarkable.  IMPRESSION: No active cardiopulmonary disease.   Electronically Signed   By: Inez Catalina M.D.   On: 10/23/2013 11:59    EKG  Interpretation    Date/Time:  Monday October 23 2013 11:37:24 EST Ventricular Rate:  60 PR Interval:    QRS Duration: 94 QT Interval:  396 QTC Calculation: 396 R Axis:   16 Text Interpretation:  Atrial fibrillation Abnormal ECG When compared with ECG of 18-Sep-2013 15:35, Vent. rate has decreased BY  58 BPM Nonspecific T wave abnormality no longer evident in Lateral leads Since last tracing rate slower Confirmed by [facility]  MD, Brenya Taulbee ([id number]) on 10/23/2013 2:54:40 PM            MDM   1. Diarrhea      Nonspecific diarrhea, improved with treatment in the emergency department. Her illness has short-term and there is no evidence for severe metabolic instability, or significant dehydration or suspected, serious bacterial infection. There is no indication for stool cultures, at this time.    Nursing Notes Reviewed/ Care Coordinated, and agree without changes. Applicable Imaging Reviewed.  Interpretation of Laboratory Data incorporated into ED treatment   Plan: Home Medications- Phenergan for nausea; Home Treatments and Observation- low fiber diet; return here if the recommended treatment, does not improve the symptoms; Recommended follow up- PCP for check up in 1 week     I personally performed the services described in this documentation, which was scribed in my presence. The recorded information has been reviewed and is accurate.     Mary Blade, MD 10/23/13 1739

## 2013-10-23 NOTE — ED Notes (Signed)
Discharge instructions reviewed with pt, questions answered. Pt verbalized understanding.  

## 2013-10-23 NOTE — ED Notes (Signed)
Sob, nausea, vomiting, chest pain,  For 1 week

## 2013-10-24 ENCOUNTER — Telehealth: Payer: Self-pay | Admitting: Family Medicine

## 2013-10-24 NOTE — Telephone Encounter (Signed)
Pt is wanting to talk to you about her medications she is not sure what she needs to be taking Call back number is [phone]

## 2013-10-25 ENCOUNTER — Emergency Department (HOSPITAL_COMMUNITY): Payer: Medicare Other

## 2013-10-25 ENCOUNTER — Inpatient Hospital Stay (HOSPITAL_COMMUNITY)
Admission: EM | Admit: 2013-10-25 | Discharge: 2013-10-27 | DRG: 918 | Disposition: A | Discharge status: Home / Self Care | Payer: Medicare Other | Attending: Family Medicine | Admitting: Family Medicine

## 2013-10-25 ENCOUNTER — Encounter (HOSPITAL_COMMUNITY): Payer: Self-pay | Admitting: Emergency Medicine

## 2013-10-25 DIAGNOSIS — R410 Disorientation, unspecified: Secondary | ICD-10-CM | POA: Diagnosis present

## 2013-10-25 DIAGNOSIS — T400X1A Poisoning by opium, accidental (unintentional), initial encounter: Secondary | ICD-10-CM | POA: Diagnosis present

## 2013-10-25 DIAGNOSIS — Z7901 Long term (current) use of anticoagulants: Secondary | ICD-10-CM

## 2013-10-25 DIAGNOSIS — E119 Type 2 diabetes mellitus without complications: Secondary | ICD-10-CM | POA: Diagnosis present

## 2013-10-25 DIAGNOSIS — Z87891 Personal history of nicotine dependence: Secondary | ICD-10-CM

## 2013-10-25 DIAGNOSIS — T426X1A Poisoning by other antiepileptic and sedative-hypnotic drugs, accidental (unintentional), initial encounter: Principal | ICD-10-CM | POA: Diagnosis present

## 2013-10-25 DIAGNOSIS — M129 Arthropathy, unspecified: Secondary | ICD-10-CM | POA: Diagnosis present

## 2013-10-25 DIAGNOSIS — I10 Essential (primary) hypertension: Secondary | ICD-10-CM | POA: Diagnosis present

## 2013-10-25 DIAGNOSIS — Z833 Family history of diabetes mellitus: Secondary | ICD-10-CM

## 2013-10-25 DIAGNOSIS — F19921 Other psychoactive substance use, unspecified with intoxication with delirium: Secondary | ICD-10-CM | POA: Diagnosis present

## 2013-10-25 DIAGNOSIS — J4489 Other specified chronic obstructive pulmonary disease: Secondary | ICD-10-CM | POA: Diagnosis present

## 2013-10-25 DIAGNOSIS — R0902 Hypoxemia: Secondary | ICD-10-CM | POA: Diagnosis present

## 2013-10-25 DIAGNOSIS — I4891 Unspecified atrial fibrillation: Secondary | ICD-10-CM | POA: Diagnosis present

## 2013-10-25 DIAGNOSIS — J449 Chronic obstructive pulmonary disease, unspecified: Secondary | ICD-10-CM | POA: Diagnosis present

## 2013-10-25 DIAGNOSIS — F419 Anxiety disorder, unspecified: Secondary | ICD-10-CM | POA: Diagnosis present

## 2013-10-25 DIAGNOSIS — E785 Hyperlipidemia, unspecified: Secondary | ICD-10-CM | POA: Diagnosis present

## 2013-10-25 DIAGNOSIS — F3289 Other specified depressive episodes: Secondary | ICD-10-CM | POA: Diagnosis present

## 2013-10-25 DIAGNOSIS — F411 Generalized anxiety disorder: Secondary | ICD-10-CM | POA: Diagnosis present

## 2013-10-25 DIAGNOSIS — Z8249 Family history of ischemic heart disease and other diseases of the circulatory system: Secondary | ICD-10-CM

## 2013-10-25 DIAGNOSIS — I5030 Unspecified diastolic (congestive) heart failure: Secondary | ICD-10-CM | POA: Diagnosis present

## 2013-10-25 DIAGNOSIS — G9340 Encephalopathy, unspecified: Secondary | ICD-10-CM

## 2013-10-25 DIAGNOSIS — IMO0002 Reserved for concepts with insufficient information to code with codable children: Secondary | ICD-10-CM | POA: Diagnosis present

## 2013-10-25 DIAGNOSIS — F32A Depression, unspecified: Secondary | ICD-10-CM | POA: Diagnosis present

## 2013-10-25 DIAGNOSIS — F329 Major depressive disorder, single episode, unspecified: Secondary | ICD-10-CM | POA: Diagnosis present

## 2013-10-25 DIAGNOSIS — Z7982 Long term (current) use of aspirin: Secondary | ICD-10-CM

## 2013-10-25 DIAGNOSIS — I509 Heart failure, unspecified: Secondary | ICD-10-CM | POA: Diagnosis present

## 2013-10-25 LAB — URINALYSIS, ROUTINE W REFLEX MICROSCOPIC
Glucose, UA: >1000 mg/dL — AB
Ketones, ur: 40 mg/dL — AB
Leukocytes, UA: NEGATIVE
Nitrite: NEGATIVE
Protein, ur: NEGATIVE mg/dL
Specific Gravity, Urine: 1.015 (ref 1.005–1.030)
Urobilinogen, UA: 0.2 mg/dL (ref 0.0–1.0)
pH: 6 (ref 5.0–8.0)

## 2013-10-25 LAB — RAPID URINE DRUG SCREEN, HOSP PERFORMED
Amphetamines: NOT DETECTED
Barbiturates: NOT DETECTED
Benzodiazepines: NOT DETECTED
Cocaine: NOT DETECTED
Opiates: NOT DETECTED
Tetrahydrocannabinol: NOT DETECTED

## 2013-10-25 LAB — CBC WITH DIFFERENTIAL/PLATELET
Basophils Absolute: 0 10*3/uL (ref 0.0–0.1)
Basophils Relative: 0 % (ref 0–1)
Eosinophils Absolute: 0 10*3/uL (ref 0.0–0.7)
Eosinophils Relative: 0 % (ref 0–5)
HCT: 34.6 % — ABNORMAL LOW (ref 36.0–46.0)
Hemoglobin: 11.4 g/dL — ABNORMAL LOW (ref 12.0–15.0)
Lymphocytes Relative: 7 % — ABNORMAL LOW (ref 12–46)
Lymphs Abs: 0.7 10*3/uL (ref 0.7–4.0)
MCH: 29.8 pg (ref 26.0–34.0)
MCHC: 32.9 g/dL (ref 30.0–36.0)
MCV: 90.6 fL (ref 78.0–100.0)
Monocytes Absolute: 0.5 10*3/uL (ref 0.1–1.0)
Monocytes Relative: 5 % (ref 3–12)
Neutro Abs: 9.9 10*3/uL — ABNORMAL HIGH (ref 1.7–7.7)
Neutrophils Relative %: 89 % — ABNORMAL HIGH (ref 43–77)
Platelets: 182 10*3/uL (ref 150–400)
RBC: 3.82 MIL/uL — ABNORMAL LOW (ref 3.87–5.11)
RDW: 16.7 % — ABNORMAL HIGH (ref 11.5–15.5)
WBC: 11.2 10*3/uL — ABNORMAL HIGH (ref 4.0–10.5)

## 2013-10-25 LAB — COMPREHENSIVE METABOLIC PANEL
ALT: 12 U/L (ref 0–35)
AST: 18 U/L (ref 0–37)
Albumin: 3.9 g/dL (ref 3.5–5.2)
Alkaline Phosphatase: 66 U/L (ref 39–117)
BUN: 10 mg/dL (ref 6–23)
CO2: 25 mEq/L (ref 19–32)
Calcium: 9.3 mg/dL (ref 8.4–10.5)
Chloride: 94 mEq/L — ABNORMAL LOW (ref 96–112)
Creatinine, Ser: 0.59 mg/dL (ref 0.50–1.10)
GFR calc Af Amer: >90 mL/min (ref >90)
GFR calc non Af Amer: >90 mL/min (ref >90)
Glucose, Bld: 131 mg/dL — ABNORMAL HIGH (ref 70–99)
Potassium: 3.6 mEq/L — ABNORMAL LOW (ref 3.7–5.3)
Sodium: 136 mEq/L — ABNORMAL LOW (ref 137–147)
Total Bilirubin: 0.7 mg/dL (ref 0.3–1.2)
Total Protein: 7.6 g/dL (ref 6.0–8.3)

## 2013-10-25 LAB — CARBOXYHEMOGLOBIN
Carboxyhemoglobin: 1 % (ref 0.5–1.5)
Methemoglobin: 0.9 % (ref 0.0–1.5)
O2 Saturation: 97.6 %
Total hemoglobin: 11.2 g/dL — ABNORMAL LOW (ref 12.0–16.0)
Total oxygen content: 15.2 mL/dL (ref 15.0–23.0)

## 2013-10-25 LAB — URINE MICROSCOPIC-ADD ON

## 2013-10-25 LAB — BLOOD GAS, ARTERIAL
Acid-Base Excess: 1.1 mmol/L (ref 0.0–2.0)
Bicarbonate: 25.6 mEq/L — ABNORMAL HIGH (ref 20.0–24.0)
Drawn by: 38235
FIO2: 0.28 %
O2 Saturation: 97.6 %
Patient temperature: 37
TCO2: 23.5 mmol/L (ref 0–100)
pCO2 arterial: 44.2 mmHg (ref 35.0–45.0)
pH, Arterial: 7.381 (ref 7.350–7.450)
pO2, Arterial: 108 mmHg — ABNORMAL HIGH (ref 80.0–100.0)

## 2013-10-25 LAB — TROPONIN I: Troponin I: <0.3 ng/mL (ref <0.30)

## 2013-10-25 LAB — ETHANOL: Alcohol, Ethyl (B): <11 mg/dL (ref 0–11)

## 2013-10-25 LAB — GLUCOSE, CAPILLARY: Glucose-Capillary: 117 mg/dL — ABNORMAL HIGH (ref 70–99)

## 2013-10-25 LAB — SALICYLATE LEVEL: Salicylate Lvl: <2 mg/dL — ABNORMAL LOW (ref 2.8–20.0)

## 2013-10-25 LAB — ACETAMINOPHEN LEVEL: Acetaminophen (Tylenol), Serum: <15 ug/mL (ref 10–30)

## 2013-10-25 MED ORDER — ONDANSETRON HCL 4 MG/2ML IJ SOLN
4.0000 mg | Freq: Once | INTRAMUSCULAR | Status: AC
  Administered 2013-10-25: 4 mg via INTRAVENOUS

## 2013-10-25 MED ORDER — LORAZEPAM 2 MG/ML IJ SOLN
1.0000 mg | Freq: Once | INTRAMUSCULAR | Status: AC
  Administered 2013-10-25: 1 mg via INTRAVENOUS

## 2013-10-25 MED ORDER — ONDANSETRON HCL 4 MG/2ML IJ SOLN
INTRAMUSCULAR | Status: AC
  Filled 2013-10-25: qty 2

## 2013-10-25 MED ORDER — IOHEXOL 350 MG/ML SOLN
100.0000 mL | Freq: Once | INTRAVENOUS | Status: AC | PRN
  Administered 2013-10-25: 100 mL via INTRAVENOUS

## 2013-10-25 MED ORDER — LORAZEPAM 2 MG/ML IJ SOLN
INTRAMUSCULAR | Status: AC
  Filled 2013-10-25: qty 1

## 2013-10-25 NOTE — ED Notes (Signed)
Patient resting in bed, awake with eyes open. Alarm going off on monitor, patient's oxygen saturation had dropped to 84-86% on room air. Good waveform and pulse from pulse oximetry. Patient placed on 2 liters of oxygen via nasal canula and oxygen saturation up to 97% on 2 liters. Dr. Wilson Singer notified.

## 2013-10-25 NOTE — Telephone Encounter (Signed)
Pt decided to wait to talk to Dr. Buelah Manis at office visit to talk about her medications.

## 2013-10-25 NOTE — ED Provider Notes (Signed)
CSN: [id number]     Arrival date & time 10/25/13  1815 History  This chart was scribed for Virgel Manifold, MD by Maree Erie, ED Scribe. The patient was seen in room APA14/APA14. Patient's care was started at 7:00 PM.    Chief Complaint  Patient presents with  . Shortness of Breath    Patient is a 62 y.o. female presenting with shortness of breath. The history is provided by the patient. No language interpreter was used.  Shortness of Breath  Level 5 caveat due to altered mental status.   HPI Comments: Mary [name] is a 62 y.o. female with a history of COPD, Diabetes, Hyperlipidemia, HTN, Atrial Fibrillation, CHF, and bronchial asthma who presents to the Emergency Department complaining of  Intermittent shortness of breath that began yesterday. She has associated cough and chest tightness. She also reports being nauseous but denies any vomiting. The patient states she is right handed.   She does not remember what year it is but her guesses were 1977, 1987 and 2005. She believes it is Wednesday or Thursday in January. She cannot name any recent holidays.   Her PCP is Dr. Buelah Manis.   Past Medical History  Diagnosis Date  . Diabetes mellitus   . Arthritis   . Anxiety   . Chronic pain     Bacl pain, Disc L5-S1- Dr. Joya Salm in [place]  . Hyperlipidemia   . Hypertension   . Tachycardia   . Bronchial asthma   . Atrial fibrillation   . COPD (chronic obstructive pulmonary disease)   . DDD (degenerative disc disease)     Radicular symptoms  . Shortness of breath   . CHF (congestive heart failure)   . Hx of echocardiogram     Was interpreted by Dr Doylene Canard that showed an Ef in the 50-60% range with grade 1 diastolic dysfunction. she had moderate MR, biatrial enlargement, moderate TR and at that time estimated RV systolic pressure was 43 mm.  . History of stress test 06/2011    Abnormal myocardial perfusion study.   Past Surgical History  Procedure Laterality Date  . Carpal tunnel release       x 2  . Breast cyst removed    . Abdominal hysterectomy      partial  . Tubal ligation    . Tonsillectomy     Family History  Problem Relation Age of Onset  . Diabetes Mother   . Heart disease Mother   . Diabetes Sister   . Hypertension Sister   . Hyperlipidemia Sister   . Diabetes Brother   . Heart disease Brother   . Diabetes Brother   . Heart disease Brother    History  Substance Use Topics  . Smoking status: Former Smoker    Types: Cigarettes  . Smokeless tobacco: Not on file     Comment: quit 6 months ago  . Alcohol Use: No   OB History   Grav Para Term Preterm Abortions TAB SAB Ect Mult Living                 Review of Systems  Unable to perform ROS: Mental status change    Allergies  Adhesive; Latex; and Zocor  Home Medications   Current Outpatient Rx  Name  Route  Sig  Dispense  Refill  . albuterol (PROAIR HFA) 108 (90 BASE) MCG/ACT inhaler   Inhalation   Inhale 1 puff into the lungs every 6 (six) hours as needed for wheezing or shortness  of breath.         . ALPRAZolam (XANAX) 1 MG tablet   Oral   Take 1 tablet (1 mg total) by mouth 3 (three) times daily as needed for anxiety.   90 tablet   0   . aspirin 81 MG tablet   Oral   Take 81 mg by mouth daily.           Marland Kitchen atorvastatin (LIPITOR) 20 MG tablet   Oral   Take 20 mg by mouth daily.         . budesonide-formoterol (SYMBICORT) 160-4.5 MCG/ACT inhaler   Inhalation   Inhale 2 puffs into the lungs 2 (two) times daily.         . calcium gluconate 500 MG tablet   Oral   Take 500 mg by mouth 2 (two) times daily.           . dabigatran (PRADAXA) 150 MG CAPS capsule   Oral   Take 150 mg by mouth 2 (two) times daily.         . digoxin (LANOXIN) 0.25 MG tablet   Oral   Take 1 tablet (0.25 mg total) by mouth daily.   30 tablet   6   . FLUoxetine (PROZAC) 20 MG capsule   Oral   Take 20 mg by mouth daily.         . furosemide (LASIX) 40 MG tablet   Oral   Take 40 mg by  mouth. Take 1/2 tablet twice daily         . INVOKANA 100 MG TABS   Oral   Take 1 tablet by mouth daily.         Marland Kitchen JANUVIA 100 MG tablet      take 1 tablet by mouth once daily   30 tablet   3   . lisinopril (PRINIVIL,ZESTRIL) 2.5 MG tablet   Oral   Take 1 tablet by mouth daily.         . metoprolol tartrate (LOPRESSOR) 25 MG tablet   Oral   Take 25 mg by mouth 2 (two) times daily.          . Multiple Vitamin (MULTIVITAMIN WITH MINERALS) TABS   Oral   Take 1 tablet by mouth daily.         Marland Kitchen oxyCODONE-acetaminophen (PERCOCET) 7.5-325 MG per tablet   Oral   Take 1 tablet by mouth 4 (four) times daily as needed for pain.   100 tablet   0     DO NOT FILL UNTIL 10/09/2013   . potassium chloride (K-DUR) 10 MEQ tablet   Oral   Take 1 tablet (10 mEq total) by mouth daily. With lasix   30 tablet   3   . pravastatin (PRAVACHOL) 80 MG tablet   Oral   Take 1 tablet by mouth daily.         . promethazine (PHENERGAN) 12.5 MG tablet      take 1 tablet by mouth every 6 hours if needed for nausea   30 tablet   2   . sodium chloride (OCEAN) 0.65 % nasal spray   Nasal   Place 1 spray into the nose as needed. Congestion          Triage Vitals: BP 141/69  Pulse 74  Temp(Src) 98.2 F (36.8 C) (Oral)  Resp 20  Ht 5\' 7"  (1.702 m)  Wt 143 lb (64.864 kg)  BMI 22.39 kg/m2  SpO2 100%  Physical Exam  Nursing note and vitals reviewed. Constitutional: She appears well-developed and well-nourished.  HENT:  Head: Normocephalic and atraumatic.  Eyes: Conjunctivae and EOM are normal. Pupils are equal, round, and reactive to light.  Neck: Normal range of motion. Neck supple.  No nuchal rigidity  Cardiovascular: Normal rate, regular rhythm and normal heart sounds.   Pulmonary/Chest: Effort normal and breath sounds normal.  Abdominal: Soft. Bowel sounds are normal.  Musculoskeletal: Normal range of motion.  Neurological: She is alert.  CN intact. Strength 5/5 b/l  u/l extremities. No increased tone. No inducible clonus. Patellar reflexes 1+.   Skin: Skin is warm and dry.  Subacute appearing excoriations to right forearm. No cellulitis.   Psychiatric:  Patient is very anxious. Tangential. Playing with her sheets and emesis bag. Disoriented to time. Answered some questions inappropriately.     ED Course  Procedures (including critical care time)  DIAGNOSTIC STUDIES: Oxygen Saturation is 100% on room air, normal by my interpretation.    COORDINATION OF CARE: 7:22 PM -Will order head CT, chest x-ray, CMP, CBC, urinalysis, drug screen panel, acetaminophen level, salicylate level, ethanol and capillary glucose labs. Patient verbalizes understanding and agrees with treatment plan.    Labs Review Labs Reviewed  GLUCOSE, CAPILLARY - Abnormal; Notable for the following:    Glucose-Capillary 117 (*)    All other components within normal limits  URINALYSIS, ROUTINE W REFLEX MICROSCOPIC - Abnormal; Notable for the following:    Glucose, UA >1000 (*)    Hgb urine dipstick TRACE (*)    Bilirubin Urine SMALL (*)    Ketones, ur 40 (*)    All other components within normal limits  CBC WITH DIFFERENTIAL - Abnormal; Notable for the following:    WBC 11.2 (*)    RBC 3.82 (*)    Hemoglobin 11.4 (*)    HCT 34.6 (*)    RDW 16.7 (*)    Neutrophils Relative % 89 (*)    Neutro Abs 9.9 (*)    Lymphocytes Relative 7 (*)    All other components within normal limits  COMPREHENSIVE METABOLIC PANEL - Abnormal; Notable for the following:    Sodium 136 (*)    Potassium 3.6 (*)    Chloride 94 (*)    Glucose, Bld 131 (*)    All other components within normal limits  SALICYLATE LEVEL - Abnormal; Notable for the following:    Salicylate Lvl 123456 (*)    All other components within normal limits  BLOOD GAS, ARTERIAL - Abnormal; Notable for the following:    pO2, Arterial 108.0 (*)    Bicarbonate 25.6 (*)    All other components within normal limits   CARBOXYHEMOGLOBIN - Abnormal; Notable for the following:    Total hemoglobin 11.2 (*)    All other components within normal limits  URINE RAPID DRUG SCREEN (HOSP PERFORMED)  ETHANOL  ACETAMINOPHEN LEVEL  TROPONIN I  URINE MICROSCOPIC-ADD ON  CBC WITH DIFFERENTIAL  DIGOXIN LEVEL  AMMONIA   Imaging Review Dg Chest 2 View  10/25/2013   CLINICAL DATA:  Shortness of breath.  Weakness and dizziness.  EXAM: CHEST  2 VIEW  COMPARISON:  10/23/2013  FINDINGS: Atherosclerotic aortic arch. Heart size within normal limits. Suspected coronary atherosclerosis. The lungs appear clear.  IMPRESSION: 1. The lungs appear clear. 2. Atherosclerosis.   Electronically Signed   By: Sherryl Barters M.D.   On: 10/25/2013 20:18   Ct Head Wo Contrast  10/25/2013   CLINICAL DATA:  Altered  mental status. Confusion. Shortness of breath.  EXAM: CT HEAD WITHOUT CONTRAST  TECHNIQUE: Contiguous axial images were obtained from the base of the skull through the vertex without intravenous contrast.  COMPARISON:  09/15/2013  FINDINGS: The brainstem, cerebellum, cerebral peduncles, thalamus, basal ganglia, basilar cisterns, and ventricular system appear within normal limits. Periventricular white matter and corona radiata hypodensities favor chronic ischemic microvascular white matter disease. No intracranial hemorrhage, mass lesion, or acute CVA.  IMPRESSION: 1. No acute intracranial findings. 2. Periventricular white matter and corona radiata hypodensities favor chronic ischemic microvascular white matter disease.   Electronically Signed   By: Sherryl Barters M.D.   On: 10/25/2013 20:27    EKG Interpretation    Date/Time:  Wednesday October 25 2013 19:27:08 EST Ventricular Rate:  71 PR Interval:    QRS Duration: 90 QT Interval:  362 QTC Calculation: 393 R Axis:   52 Text Interpretation:  Atrial fibrillation Abnormal ECG When compared with ECG of 23-Oct-2013 11:37, No significant change was found Confirmed by [place]  MD,  Davetta Olliff ([id number]) on 10/25/2013 10:58:09 PM            MDM   1. Acute encephalopathy     62 year old female with altered mental status. Patient is disoriented to time and very tangential. Unclear if this is an acute psychotic event versus an acute delirium. Her ED w/u thus far has not revealed and obvious medical explanation. Pt was prescribed phenergan on last ED visit. She says she only took two doses today though. Denies any inappropriate ingestion. Denies trauma. Does not appear to be infectious. Neuro exam is nonfocal aside from mental status. Will additionally check a digoxin and ammonia level. Psychiatric evaluation pending normal labs.   Virgel Manifold, MD 10/26/13 [phone]

## 2013-10-25 NOTE — ED Notes (Signed)
Pt up to bedside commode with assistance from NT.

## 2013-10-25 NOTE — ED Notes (Signed)
MD at bedside. 

## 2013-10-25 NOTE — ED Notes (Signed)
Cough, sob,nausea, vomiting, Seen here 12/29

## 2013-10-26 ENCOUNTER — Other Ambulatory Visit (HOSPITAL_COMMUNITY): Payer: Self-pay | Admitting: Family Medicine

## 2013-10-26 ENCOUNTER — Encounter (HOSPITAL_COMMUNITY): Payer: Self-pay | Admitting: Internal Medicine

## 2013-10-26 DIAGNOSIS — R0902 Hypoxemia: Secondary | ICD-10-CM

## 2013-10-26 DIAGNOSIS — R410 Disorientation, unspecified: Secondary | ICD-10-CM | POA: Diagnosis present

## 2013-10-26 DIAGNOSIS — R404 Transient alteration of awareness: Secondary | ICD-10-CM

## 2013-10-26 DIAGNOSIS — E119 Type 2 diabetes mellitus without complications: Secondary | ICD-10-CM

## 2013-10-26 DIAGNOSIS — I4891 Unspecified atrial fibrillation: Secondary | ICD-10-CM

## 2013-10-26 DIAGNOSIS — G9340 Encephalopathy, unspecified: Secondary | ICD-10-CM

## 2013-10-26 LAB — GLUCOSE, CAPILLARY
GLUCOSE-CAPILLARY: 123 mg/dL — AB (ref 70–99)
Glucose-Capillary: 142 mg/dL — ABNORMAL HIGH (ref 70–99)
Glucose-Capillary: 103 mg/dL — ABNORMAL HIGH (ref 70–99)
Glucose-Capillary: 136 mg/dL — ABNORMAL HIGH (ref 70–99)

## 2013-10-26 LAB — AMMONIA: Ammonia: 18 umol/L (ref 11–60)

## 2013-10-26 LAB — FOLATE: Folate: >20 ng/mL

## 2013-10-26 LAB — RETICULOCYTES
RBC.: 3.69 MIL/uL — ABNORMAL LOW (ref 3.87–5.11)
RETIC COUNT ABSOLUTE: 95.9 10*3/uL (ref 19.0–186.0)
RETIC CT PCT: 2.6 % (ref 0.4–3.1)

## 2013-10-26 LAB — DIGOXIN LEVEL: Digoxin Level: 1.4 ng/mL (ref 0.8–2.0)

## 2013-10-26 LAB — VITAMIN B12: Vitamin B-12: 758 pg/mL (ref 211–911)

## 2013-10-26 LAB — FERRITIN: FERRITIN: 24 ng/mL (ref 10–291)

## 2013-10-26 LAB — TSH: TSH: 0.105 u[IU]/mL — AB (ref 0.350–4.500)

## 2013-10-26 MED ORDER — ONDANSETRON HCL 4 MG PO TABS
4.0000 mg | ORAL_TABLET | Freq: Four times a day (QID) | ORAL | Status: DC | PRN
  Administered 2013-10-26 (×2): 4 mg via ORAL
  Filled 2013-10-26 (×2): qty 1

## 2013-10-26 MED ORDER — SODIUM CHLORIDE 0.9 % IJ SOLN
3.0000 mL | Freq: Two times a day (BID) | INTRAMUSCULAR | Status: DC
Start: 2013-10-26 — End: 2013-10-27
  Administered 2013-10-27: 3 mL via INTRAVENOUS

## 2013-10-26 MED ORDER — ONDANSETRON HCL 4 MG/2ML IJ SOLN: 4.0000 mg | Freq: Four times a day (QID) | INTRAMUSCULAR | Status: DC | PRN

## 2013-10-26 MED ORDER — ALPRAZOLAM 0.5 MG PO TABS
0.5000 mg | ORAL_TABLET | Freq: Three times a day (TID) | ORAL | Status: DC | PRN
  Administered 2013-10-26 (×2): 0.5 mg via ORAL
  Filled 2013-10-26 (×2): qty 1

## 2013-10-26 MED ORDER — FUROSEMIDE 20 MG PO TABS
20.0000 mg | ORAL_TABLET | Freq: Two times a day (BID) | ORAL | Status: DC
  Administered 2013-10-26 – 2013-10-27 (×3): 20 mg via ORAL
  Filled 2013-10-26 (×3): qty 1

## 2013-10-26 MED ORDER — ACETAMINOPHEN 650 MG RE SUPP: 650.0000 mg | Freq: Four times a day (QID) | RECTAL | Status: DC | PRN

## 2013-10-26 MED ORDER — INSULIN ASPART 100 UNIT/ML ~~LOC~~ SOLN: 0.0000 [IU] | Freq: Three times a day (TID) | SUBCUTANEOUS | Status: DC

## 2013-10-26 MED ORDER — VITAMIN B-1 100 MG PO TABS
100.0000 mg | ORAL_TABLET | Freq: Every day | ORAL | Status: DC
  Administered 2013-10-26 – 2013-10-27 (×2): 100 mg via ORAL
  Filled 2013-10-26 (×2): qty 1

## 2013-10-26 MED ORDER — ACETAMINOPHEN 325 MG PO TABS: 650.0000 mg | ORAL_TABLET | Freq: Four times a day (QID) | ORAL | Status: DC | PRN

## 2013-10-26 MED ORDER — OXYCODONE-ACETAMINOPHEN 5-325 MG PO TABS
1.5000 | ORAL_TABLET | Freq: Four times a day (QID) | ORAL | Status: DC | PRN
  Administered 2013-10-26: 1.5 via ORAL
  Filled 2013-10-26: qty 2

## 2013-10-26 MED ORDER — LISINOPRIL 5 MG PO TABS
2.5000 mg | ORAL_TABLET | Freq: Every day | ORAL | Status: DC
  Administered 2013-10-26 – 2013-10-27 (×2): 2.5 mg via ORAL
  Filled 2013-10-26 (×2): qty 1

## 2013-10-26 MED ORDER — ASPIRIN EC 81 MG PO TBEC
81.0000 mg | DELAYED_RELEASE_TABLET | Freq: Every day | ORAL | Status: DC
  Administered 2013-10-26 – 2013-10-27 (×2): 81 mg via ORAL
  Filled 2013-10-26 (×2): qty 1

## 2013-10-26 MED ORDER — SODIUM CHLORIDE 0.9 % IJ SOLN: 3.0000 mL | INTRAMUSCULAR | Status: DC | PRN

## 2013-10-26 MED ORDER — NALOXONE HCL 1 MG/ML IJ SOLN
INTRAMUSCULAR | Status: AC
  Filled 2013-10-26: qty 2

## 2013-10-26 MED ORDER — INSULIN ASPART 100 UNIT/ML ~~LOC~~ SOLN: 0.0000 [IU] | Freq: Every day | SUBCUTANEOUS | Status: DC

## 2013-10-26 MED ORDER — SODIUM CHLORIDE 0.9 % IV SOLN
250.0000 mL | INTRAVENOUS | Status: DC | PRN
Start: 2013-10-26 — End: 2013-10-27

## 2013-10-26 MED ORDER — METOPROLOL TARTRATE 25 MG PO TABS
25.0000 mg | ORAL_TABLET | Freq: Two times a day (BID) | ORAL | Status: DC
  Administered 2013-10-26 – 2013-10-27 (×3): 25 mg via ORAL
  Filled 2013-10-26 (×3): qty 1

## 2013-10-26 MED ORDER — DABIGATRAN ETEXILATE MESYLATE 150 MG PO CAPS
150.0000 mg | ORAL_CAPSULE | Freq: Two times a day (BID) | ORAL | Status: DC
  Administered 2013-10-26 – 2013-10-27 (×3): 150 mg via ORAL
  Filled 2013-10-26 (×9): qty 1

## 2013-10-26 MED ORDER — BUDESONIDE-FORMOTEROL FUMARATE 160-4.5 MCG/ACT IN AERO
2.0000 | INHALATION_SPRAY | Freq: Two times a day (BID) | RESPIRATORY_TRACT | Status: DC
  Administered 2013-10-26 – 2013-10-27 (×3): 2 via RESPIRATORY_TRACT
  Filled 2013-10-26: qty 6

## 2013-10-26 MED ORDER — SODIUM CHLORIDE 0.9 % IJ SOLN
3.0000 mL | Freq: Two times a day (BID) | INTRAMUSCULAR | Status: DC
  Administered 2013-10-26: 3 mL via INTRAVENOUS

## 2013-10-26 MED ORDER — FLUOXETINE HCL 20 MG PO CAPS
20.0000 mg | ORAL_CAPSULE | Freq: Every day | ORAL | Status: DC
  Administered 2013-10-26 – 2013-10-27 (×2): 20 mg via ORAL
  Filled 2013-10-26 (×2): qty 1

## 2013-10-26 NOTE — ED Notes (Signed)
Dr. Krishnan at bedside. 

## 2013-10-26 NOTE — ED Notes (Signed)
Pt agitated, pulled off monitor attachments. Stating "I don't know why I am here. Why did Earlie Server leave me here? I don't need to be here." Able to redirect pt back to bed, pt allowed monitor attachments to be replaced. Explained to pt need for hospital admission. Pt denies understanding but agrees to stay "until Earlie Server can come get me in the morning." Pt alert, orientated to self, but not to place, time, or situation.

## 2013-10-26 NOTE — ED Notes (Signed)
MD at bedside. 

## 2013-10-26 NOTE — ED Provider Notes (Signed)
2:31 AM I assumed care in signout Pt here with dyspnea as well as altered mental status, which is new for her I spoke to her friend Earlie Server [phone]) who reports this change in mental status is new for her On exam, pt does appear confused, reports she lives with friends (lives with family) she thought it was daytime, and her speech is tangential.  This appears to be delirium of unclear etiology.  Her workup thus far has been unremarkable.  She may need MRI.  She did have some hypoxia during ED stay, which is new for her, this could be contributing.  Will d/w hospitalist for medical admission  Sharyon Cable, MD 10/26/13 [phone]

## 2013-10-26 NOTE — Progress Notes (Signed)
Pt is oriented to self and at times, oriented to situation and place.  Pt. States that she does not know how she got here and why she is here.  Pt. State that she does not know where she was prior to coming here.

## 2013-10-26 NOTE — ED Notes (Signed)
Pt ambulated around nurses station on room air per Dr. Raechel Chute request. Pt ambulated with minimal distress, slight dyspnea with the exertion. Increased dyspnea upon arrival to room and pt sitting upon bed. Pt stated "I can't catch my breath". O2 sats at 100% on room air upon return. Pt breathing returned to normal, unlabored regular breaths. Nad noted at this time. Dr. Christy Gentles made aware.

## 2013-10-26 NOTE — H&P (Signed)
[facility] History and Physical  Mary [name] PJA:[id number] DOB: Jun 22, 1951 DOA: 10/25/2013   PCP: Vic Blackbird, MD  Specialists: Dr. Claiborne Billings is her cardiologist.  Chief Complaint: Confusion  HPI: Mary [name] is a 63 y.o. female with a past medical history of chronic atrial fibrillation on anticoagulation, anxiety disorder, and depression, diabetes, mellitus type II, who was in her usual state of health till the last day or 2 when her friend thought that the patient was getting very confused. Unfortunately, her friend has left. Patient is very delirious, and unable to answer any questions. Most of the information was obtained from previous notes. She originally presented with shortness of breath, but was noted to have confusion. She cannot answer the "Orientation" questions. According to her friend this is all new. She was also noted to be hypoxic initially in the emergency department. She underwent quite an extensive workup, including CT chest, which did not show any concerning findings. Her hypoxia, subsequently resolved on its own. Reason for her delirium is not entirely clear at this time. Subsequently, we were called for evaluation.  Home Medications: Prior to Admission medications   Medication Sig Start Date End Date Taking? Authorizing Provider  aspirin EC 81 MG tablet Take 81 mg by mouth daily.   Yes Historical Provider, MD  albuterol (PROAIR HFA) 108 (90 BASE) MCG/ACT inhaler Inhale 1 puff into the lungs every 6 (six) hours as needed for wheezing or shortness of breath.    Historical Provider, MD  ALPRAZolam Duanne Moron) 1 MG tablet Take 1 tablet (1 mg total) by mouth 3 (three) times daily as needed for anxiety. 09/29/13   Alycia Rossetti, MD  atorvastatin (LIPITOR) 20 MG tablet Take 20 mg by mouth daily.    Historical Provider, MD  budesonide-formoterol (SYMBICORT) 160-4.5 MCG/ACT inhaler Inhale 2 puffs into the lungs 2 (two) times daily.    Historical Provider, MD  calcium  gluconate 500 MG tablet Take 500 mg by mouth 2 (two) times daily.      Historical Provider, MD  dabigatran (PRADAXA) 150 MG CAPS capsule Take 150 mg by mouth 2 (two) times daily.    Historical Provider, MD  FLUoxetine (PROZAC) 20 MG capsule Take 20 mg by mouth daily.    Historical Provider, MD  furosemide (LASIX) 40 MG tablet Take 20 mg by mouth 2 (two) times daily. Take 1/2 tablet twice daily 09/19/13   Lezlie Octave Black, NP  INVOKANA 100 MG TABS Take 1 tablet by mouth daily. 10/02/13   Historical Provider, MD  JANUVIA 100 MG tablet take 1 tablet by mouth once daily 08/04/13   Alycia Rossetti, MD  lisinopril (PRINIVIL,ZESTRIL) 2.5 MG tablet Take 1 tablet by mouth daily. 09/23/13   Historical Provider, MD  metoprolol tartrate (LOPRESSOR) 25 MG tablet Take 25 mg by mouth 2 (two) times daily.     Historical Provider, MD  Multiple Vitamin (MULTIVITAMIN WITH MINERALS) TABS Take 1 tablet by mouth daily.    Historical Provider, MD  oxyCODONE-acetaminophen (PERCOCET) 7.5-325 MG per tablet Take 1 tablet by mouth 4 (four) times daily as needed for pain. 09/29/13 09/29/14  Alycia Rossetti, MD  potassium chloride (K-DUR) 10 MEQ tablet Take 1 tablet (10 mEq total) by mouth daily. With lasix 06/14/13   Susy Frizzle, MD  pravastatin (PRAVACHOL) 80 MG tablet Take 1 tablet by mouth daily. 10/06/13   Historical Provider, MD  promethazine (PHENERGAN) 12.5 MG tablet take 1 tablet by mouth every 6 hours if needed for  nausea 09/06/13   Alycia Rossetti, MD  sodium chloride (OCEAN) 0.65 % nasal spray Place 1 spray into the nose as needed. Congestion    Historical Provider, MD    Allergies:  Allergies  Allergen Reactions  . Adhesive [Tape] Other (See Comments)    Tears skin off.   . Latex Other (See Comments)    In tape, tears skin off.  . Zocor [Simvastatin - High Dose] Nausea And Vomiting    Past Medical History: Past Medical History  Diagnosis Date  . Diabetes mellitus   . Arthritis   . Anxiety   . Chronic  pain     Bacl pain, Disc L5-S1- Dr. Joya Salm in [place]  . Hyperlipidemia   . Hypertension   . Tachycardia   . Bronchial asthma   . Atrial fibrillation   . COPD (chronic obstructive pulmonary disease)   . DDD (degenerative disc disease)     Radicular symptoms  . Shortness of breath   . CHF (congestive heart failure)   . Hx of echocardiogram     Was interpreted by Dr Doylene Canard that showed an Ef in the 50-60% range with grade 1 diastolic dysfunction. she had moderate MR, biatrial enlargement, moderate TR and at that time estimated RV systolic pressure was 43 mm.  . History of stress test 06/2011    Abnormal myocardial perfusion study.    Past Surgical History  Procedure Laterality Date  . Carpal tunnel release      x 2  . Breast cyst removed    . Abdominal hysterectomy      partial  . Tubal ligation    . Tonsillectomy      Social History: Patient apparently lives with family members. She quit smoking about 6 months ago. She denies any alcohol use illicit drug use. Usually independent with daily activities.  Family History:  Family History  Problem Relation Age of Onset  . Diabetes Mother   . Heart disease Mother   . Diabetes Sister   . Hypertension Sister   . Hyperlipidemia Sister   . Diabetes Brother   . Heart disease Brother   . Diabetes Brother   . Heart disease Brother      Review of Systems - unable to do due to confusion and delirium  Physical Examination  Filed Vitals:   10/25/13 2230 10/25/13 2324 10/26/13 0000 10/26/13 0225  BP: 154/85 135/72 162/87 147/80  Pulse: 74 90 73 102  Temp:    98.1 F (36.7 C)  TempSrc:    Oral  Resp: _0 Height:      Weight:      SpO2: 98% 100% 99% 100%    General appearance: alert, appears stated age, distracted and no distress Head: Normocephalic, without obvious abnormality, atraumatic Eyes: conjunctivae/corneas clear. PERRL, EOM's intact. Neck: no adenopathy, no carotid bruit, no JVD, supple, symmetrical, trachea  midline and thyroid not enlarged, symmetric, no tenderness/mass/nodules Back: symmetric, no curvature. ROM normal. No CVA tenderness. Resp: clear to auscultation bilaterally Cardio: S1-S2 is irregularly irregular. No S3, S4. No rubs, murmurs, or bruits. No pedal edema GI: soft, non-tender; bowel sounds normal; no masses,  no organomegaly Extremities: extremities normal, atraumatic, no cyanosis or edema Pulses: 2+ and symmetric Skin: Skin color, texture, turgor normal. No rashes or lesions Lymph nodes: Cervical, supraclavicular, and axillary nodes normal. Neurologic: She is alert. Very confused. She did not know where she was. She didn't know the year. Cranial nerves were intact. Motor strength is  equal, bilateral upper and lower extremities. Gait was not assessed.  Laboratory Data: Results for orders placed during the hospital encounter of 10/25/13 (from the past 48 hour(s))  GLUCOSE, CAPILLARY     Status: Abnormal   Collection Time    10/25/13  7:16 PM      Result Value Range   Glucose-Capillary 117 (*) 70 - 99 mg/dL  URINALYSIS, ROUTINE W REFLEX MICROSCOPIC     Status: Abnormal   Collection Time    10/25/13  8:39 PM      Result Value Range   Color, Urine YELLOW  YELLOW   APPearance CLEAR  CLEAR   Specific Gravity, Urine 1.015  1.005 - 1.030   pH 6.0  5.0 - 8.0   Glucose, UA >1000 (*) NEGATIVE mg/dL   Hgb urine dipstick TRACE (*) NEGATIVE   Bilirubin Urine SMALL (*) NEGATIVE   Ketones, ur 40 (*) NEGATIVE mg/dL   Protein, ur NEGATIVE  NEGATIVE mg/dL   Urobilinogen, UA 0.2  0.0 - 1.0 mg/dL   Nitrite NEGATIVE  NEGATIVE   Leukocytes, UA NEGATIVE  NEGATIVE  URINE RAPID DRUG SCREEN (HOSP PERFORMED)     Status: None   Collection Time    10/25/13  8:39 PM      Result Value Range   Opiates NONE DETECTED  NONE DETECTED   Cocaine NONE DETECTED  NONE DETECTED   Benzodiazepines NONE DETECTED  NONE DETECTED   Amphetamines NONE DETECTED  NONE DETECTED   Tetrahydrocannabinol NONE DETECTED   NONE DETECTED   Barbiturates NONE DETECTED  NONE DETECTED   Comment:            DRUG SCREEN FOR MEDICAL PURPOSES     ONLY.  IF CONFIRMATION IS NEEDED     FOR ANY PURPOSE, NOTIFY LAB     WITHIN 5 DAYS.                LOWEST DETECTABLE LIMITS     FOR URINE DRUG SCREEN     Drug Class       Cutoff (ng/mL)     Amphetamine      1000     Barbiturate      200     Benzodiazepine   102     Tricyclics       585     Opiates          300     Cocaine          300     THC              50  URINE MICROSCOPIC-ADD ON     Status: None   Collection Time    10/25/13  8:39 PM      Result Value Range   Squamous Epithelial / LPF RARE  RARE   WBC, UA 0-2  <3 WBC/hpf   RBC / HPF 0-2  <3 RBC/hpf   Bacteria, UA RARE  RARE  ETHANOL     Status: None   Collection Time    10/25/13  8:58 PM      Result Value Range   Alcohol, Ethyl (B) <11  0 - 11 mg/dL   Comment:            LOWEST DETECTABLE LIMIT FOR     SERUM ALCOHOL IS 11 mg/dL     FOR MEDICAL PURPOSES ONLY  CBC WITH DIFFERENTIAL     Status: Abnormal   Collection Time    10/25/13  8:58 PM      Result Value Range   WBC 11.2 (*) 4.0 - 10.5 K/uL   RBC 3.82 (*) 3.87 - 5.11 MIL/uL   Hemoglobin 11.4 (*) 12.0 - 15.0 g/dL   HCT 34.6 (*) 36.0 - 46.0 %   MCV 90.6  78.0 - 100.0 fL   MCH 29.8  26.0 - 34.0 pg   MCHC 32.9  30.0 - 36.0 g/dL   RDW 16.7 (*) 11.5 - 15.5 %   Platelets 182  150 - 400 K/uL   Neutrophils Relative % 89 (*) 43 - 77 %   Neutro Abs 9.9 (*) 1.7 - 7.7 K/uL   Lymphocytes Relative 7 (*) 12 - 46 %   Lymphs Abs 0.7  0.7 - 4.0 K/uL   Monocytes Relative 5  3 - 12 %   Monocytes Absolute 0.5  0.1 - 1.0 K/uL   Eosinophils Relative 0  0 - 5 %   Eosinophils Absolute 0.0  0.0 - 0.7 K/uL   Basophils Relative 0  0 - 1 %   Basophils Absolute 0.0  0.0 - 0.1 K/uL  COMPREHENSIVE METABOLIC PANEL     Status: Abnormal   Collection Time    10/25/13  8:58 PM      Result Value Range   Sodium 136 (*) 137 - 147 mEq/L   Comment: Please note change in  reference range.   Potassium 3.6 (*) 3.7 - 5.3 mEq/L   Comment: Please note change in reference range.   Chloride 94 (*) 96 - 112 mEq/L   CO2 25  19 - 32 mEq/L   Glucose, Bld 131 (*) 70 - 99 mg/dL   BUN 10  6 - 23 mg/dL   Creatinine, Ser 0.59  0.50 - 1.10 mg/dL   Calcium 9.3  8.4 - 10.5 mg/dL   Total Protein 7.6  6.0 - 8.3 g/dL   Albumin 3.9  3.5 - 5.2 g/dL   AST 18  0 - 37 U/L   ALT 12  0 - 35 U/L   Alkaline Phosphatase 66  39 - 117 U/L   Total Bilirubin 0.7  0.3 - 1.2 mg/dL   GFR calc non Af Amer >90  >90 mL/min   GFR calc Af Amer >90  >90 mL/min   Comment: (NOTE)     The eGFR has been calculated using the CKD EPI equation.     This calculation has not been validated in all clinical situations.     eGFR's persistently <90 mL/min signify possible Chronic Kidney     Disease.  TROPONIN I     Status: None   Collection Time    10/25/13  8:58 PM      Result Value Range   Troponin I <0.30  <0.30 ng/mL   Comment:            Due to the release kinetics of cTnI,     a negative result within the first hours     of the onset of symptoms does not rule out     myocardial infarction with certainty.     If myocardial infarction is still suspected,     repeat the test at appropriate intervals.  ACETAMINOPHEN LEVEL     Status: None   Collection Time    10/25/13  8:59 PM      Result Value Range   Acetaminophen (Tylenol), Serum <15.0  10 - 30 ug/mL   Comment:  THERAPEUTIC CONCENTRATIONS VARY     SIGNIFICANTLY. A RANGE OF 10-30     ug/mL MAY BE AN EFFECTIVE     CONCENTRATION FOR MANY PATIENTS.     HOWEVER, SOME ARE BEST TREATED     AT CONCENTRATIONS OUTSIDE THIS     RANGE.     ACETAMINOPHEN CONCENTRATIONS     >150 ug/mL AT 4 HOURS AFTER     INGESTION AND >50 ug/mL AT 12     HOURS AFTER INGESTION ARE     OFTEN ASSOCIATED WITH TOXIC     REACTIONS.  SALICYLATE LEVEL     Status: Abnormal   Collection Time    10/25/13  8:59 PM      Result Value Range   Salicylate Lvl <8.5  (*) 2.8 - 20.0 mg/dL  BLOOD GAS, ARTERIAL     Status: Abnormal   Collection Time    10/25/13  9:20 PM      Result Value Range   FIO2 0.28     Delivery systems NASAL CANNULA     pH, Arterial 7.381  7.350 - 7.450   pCO2 arterial 44.2  35.0 - 45.0 mmHg   pO2, Arterial 108.0 (*) 80.0 - 100.0 mmHg   Bicarbonate 25.6 (*) 20.0 - 24.0 mEq/L   TCO2 23.5  0 - 100 mmol/L   Acid-Base Excess 1.1  0.0 - 2.0 mmol/L   O2 Saturation 97.6     Patient temperature 37.0     Collection site RIGHT RADIAL     Drawn by [phone]     Sample type ARTERIAL DRAW     Allens test (pass/fail) PASS  PASS  CARBOXYHEMOGLOBIN     Status: Abnormal   Collection Time    10/25/13  9:20 PM      Result Value Range   Total hemoglobin 11.2 (*) 12.0 - 16.0 g/dL   O2 Saturation 97.6     Carboxyhemoglobin 1.0  0.5 - 1.5 %   Methemoglobin 0.9  0.0 - 1.5 %   Total oxygen content 15.2  15.0 - 23.0 mL/dL  DIGOXIN LEVEL     Status: None   Collection Time    10/26/13 12:48 AM      Result Value Range   Digoxin Level 1.4  0.8 - 2.0 ng/mL  AMMONIA     Status: None   Collection Time    10/26/13 12:59 AM      Result Value Range   Ammonia 18  11 - 60 umol/L    Radiology Reports: Dg Chest 2 View  10/25/2013   CLINICAL DATA:  Shortness of breath.  Weakness and dizziness.  EXAM: CHEST  2 VIEW  COMPARISON:  10/23/2013  FINDINGS: Atherosclerotic aortic arch. Heart size within normal limits. Suspected coronary atherosclerosis. The lungs appear clear.  IMPRESSION: 1. The lungs appear clear. 2. Atherosclerosis.   Electronically Signed   By: Sherryl Barters M.D.   On: 10/25/2013 20:18   Ct Head Wo Contrast  10/25/2013   CLINICAL DATA:  Altered mental status. Confusion. Shortness of breath.  EXAM: CT HEAD WITHOUT CONTRAST  TECHNIQUE: Contiguous axial images were obtained from the base of the skull through the vertex without intravenous contrast.  COMPARISON:  09/15/2013  FINDINGS: The brainstem, cerebellum, cerebral peduncles, thalamus,  basal ganglia, basilar cisterns, and ventricular system appear within normal limits. Periventricular white matter and corona radiata hypodensities favor chronic ischemic microvascular white matter disease. No intracranial hemorrhage, mass lesion, or acute CVA.  IMPRESSION: 1. No acute intracranial findings.  2. Periventricular white matter and corona radiata hypodensities favor chronic ischemic microvascular white matter disease.   Electronically Signed   By: Sherryl Barters M.D.   On: 10/25/2013 20:27   Ct Angio Chest W/cm &/or Wo Cm  10/25/2013   CLINICAL DATA:  Shortness of breath.  Altered mental status.  EXAM: CT ANGIOGRAPHY CHEST WITH CONTRAST  TECHNIQUE: Multidetector CT imaging of the chest was performed using the standard protocol during bolus administration of intravenous contrast. Multiplanar CT image reconstructions including MIPs were obtained to evaluate the vascular anatomy.  CONTRAST:  130m OMNIPAQUE IOHEXOL 350 MG/ML SOLN  COMPARISON:  02/21/2007  FINDINGS: Technically adequate study with good opacification of the central and segmental pulmonary arteries. No focal filling defects are demonstrated. No evidence of significant pulmonary embolus.  Mild cardiac enlargement. Coronary artery calcifications. Normal caliber thoracic aorta with calcification. No evidence of dissection. No significant lymphadenopathy in the chest. Esophagus is mostly decompressed. There is a small esophageal hiatal hernia. No pleural effusion. Small calcification in the right thyroid gland without enlargement. Visualized portions of the upper abdominal organs are grossly unremarkable.  Evaluation of lung fields is limited due to respiratory motion artifact. Multiple bilateral pulmonary nodules are demonstrated, the largest probably on the right lower lobe measuring 6 mm diameter. These nodules were present on the previous study without apparent interval change. No focal consolidation or airspace disease in the lungs. No  pneumothorax. Airways appear patent. Degenerative changes in the spine. No destructive bone lesions. There is irregularity of the mid sternum but is probably due to motion artifact.  Review of the MIP images confirms the above findings.  IMPRESSION: No evidence of significant pulmonary embolus. Stable small bilateral pulmonary nodules. Due to long-term stability, these are likely to be benign. No evidence of active pulmonary disease.   Electronically Signed   By: WLucienne CapersM.D.   On: 10/25/2013 22:44    Electrocardiogram: Atrial fibrillation at 71 beats per minute. Normal axis. Intervals are normal. No concerning ST or T-wave changes are noted  Problem List  Principal Problem:   Delirium, acute Active Problems:   Diabetes mellitus   COPD (chronic obstructive pulmonary disease)   Anxiety   Depression   Diastolic congestive heart failure   Atrial fibrillation   Hypoxia   Acute delirium   Assessment: This is a 63year old, Caucasian female, who initially presented for shortness of breath. She has undergone an extensive evaluation for the same and no clear etiology has been found. Could be related to her COPD, but hypoxia seems to have resolved. However, she is found to be delirious. Most likely reason is recent initiation of digoxin.  Plan: #1 acute delirium, probably secondary to digoxin: Extensive review of her chart revealed that she was started on digoxin by her cardiologist earlier in December for control of atrial fibrillation in the setting of low blood pressures. She doesn't appear to be on any other new medications. No other medication was discontinued recently. Dose of of Prozac was increased by her primary care physician within the last 2-3 months. Digoxin can cause delirium. Digoxin level, however, was normal. We'll discontinue this medication at this time. We will evaluate for other reasons of delirium by checking TSH, B12, RPR. CT head did not reveal any acute findings, but  there may be a case to do MRI. Patient however, does not have any focal neurological deficits. No infectious etiology is found either. If patient's mental status does not improve, neurology, and/or psychiatry evaluation may be  necessary.  #2 history of atrial fibrillation: Continue with beta blocker. Monitor her on telemetry. She used to be on calcium channel blockers as well, but blood pressure was an issue, and it was discontinued. Plus it was also possible that she was taking both carvedilol and metoprolol at same time recently. Now she is only on metoprolol. Adjust her medications as determined by heart rate. Continue Pradaxa.  #3 diabetes mellitus, type II: Sliding scale insulin coverage will be prescribed. HbA1c will be checked.  #4 transient hypoxia: Most likely due to history of COPD. She's currently off oxygen and saturating about 93-94% on room air. Continue to monitor.  #5 history of anxiety and depression: Continue with her psychotropic agents.  DVT Prophylaxis: She is on full anticoagulation Code Status: Full code Family Communication: No family is available  Disposition Plan: Admit to telemetry   Further management decisions will depend on results of further testing and patient's response to treatment.  [facility]  [facility] Pager [phone]  If 7PM-7AM, please contact night-coverage [email or web address] Password [email or web address]  10/26/2013, 3:16 AM

## 2013-10-26 NOTE — Progress Notes (Signed)
Utilization Review Complete  

## 2013-10-26 NOTE — Progress Notes (Signed)
12:42 PM I agree with HPI/GPe and A/P per Dr. [place] Pink  63 y.o. ?, known perm Afib CHad2Vasc2 score~4, on Pradaxa, Htn, DM ty 2, COPD, recently d/c from hospital 09/18/13 with Afib c RVR + CP-Hypotension and diuretics stopped that admit, presented first 12/29 with non-specific diarrhea/N to ED and was triaged home with some phenergan-she took 2 doses and re-presented to [facility] ed 12/31 with delirium, thought hypoxic in nature  She was admitted with what was thought to be digoxin toxicity however her digoxin level was 1.4 uric albumin is normal so this is a real level. She admits to taking some more of her regular Percocet recently and I am not completely sure she did not take more Phenergan then she was supposed to take and from her emergency room visit on the 29th She is very tangential and talks continuously about things not related to what I am trying to ask and has to be redirected at least 10 times. She states that her grand-daughterJessica had to call her friend Earlie Server for her to come to the emergency room. Patient states she gives herself her medications but sometimes "gets off track" Tried to call the granddaughter that she lives with uncle cell phone as well as her home phone but did not get a response.  I called her friend Ernestine Conrad on [phone] called Earlie Server as she had had "been acting funny'  Was talking strangely.  She states that patient is still not at baseline.  She allegedly "it's not very happy at home". She moved in with her granddaughter is probably about 6 months ago when her 85 year old daughter died. I don't think she has recovered from that. She states that she gives herself her medications usually as above-her friend Earlie Server lives at about 15-20 minutes away from bursa there is no other supervision in the home.   admission 12/06/12 [facility]    admission 09/10/12 [facility], [facility]   Patient Active Problem List   Diagnosis Date Noted  . Delirium, acute 10/26/2013   . Hypoxia 10/26/2013  . Acute delirium 10/26/2013  . Chest pain 09/18/2013  . Fall at home 09/18/2013  . Hypotension 09/18/2013  . Dehydration 09/18/2013  . Recurrent falls 09/18/2013  . Leg edema 02/19/2013  . Tinnitus of left ear 02/19/2013  . Toe ulcer 02/19/2013  . Diastolic congestive heart failure 09/10/2012  . Atrial fibrillation 09/10/2012  . DDD (degenerative disc disease), lumbar 08/26/2012  . Atypical mole 04/14/2012  . Seborrheic keratosis 04/14/2012  . Tobacco use 04/14/2012  . Depression 12/09/2011  . COPD (chronic obstructive pulmonary disease) 06/27/2011  . Anxiety 06/27/2011  . Chronic pain 05/27/2011  . Diabetes mellitus 05/26/2011  . Hyperlipidemia 05/26/2011   As above as per note In addition we will get social worker involved to determine the actual status of things at home and case manager involvement with regards to medication management as well as RN visits at home. Patient will need to stay in the hospital until he can determine a very safe discharge plan.   Verneita Griffes, MD [facility] [phone]

## 2013-10-27 ENCOUNTER — Encounter (HOSPITAL_COMMUNITY): Payer: Self-pay | Admitting: Radiology

## 2013-10-27 ENCOUNTER — Inpatient Hospital Stay (HOSPITAL_COMMUNITY): Payer: Medicare Other

## 2013-10-27 LAB — CBC
HCT: 37 % (ref 36.0–46.0)
Hemoglobin: 12.1 g/dL (ref 12.0–15.0)
MCH: 30 pg (ref 26.0–34.0)
MCHC: 32.7 g/dL (ref 30.0–36.0)
MCV: 91.8 fL (ref 78.0–100.0)
Platelets: 202 10*3/uL (ref 150–400)
RBC: 4.03 MIL/uL (ref 3.87–5.11)
RDW: 17.1 % — AB (ref 11.5–15.5)
WBC: 8 10*3/uL (ref 4.0–10.5)

## 2013-10-27 LAB — GLUCOSE, CAPILLARY
GLUCOSE-CAPILLARY: 97 mg/dL (ref 70–99)
Glucose-Capillary: 183 mg/dL — ABNORMAL HIGH (ref 70–99)

## 2013-10-27 LAB — COMPREHENSIVE METABOLIC PANEL
ALK PHOS: 57 U/L (ref 39–117)
ALT: 10 U/L (ref 0–35)
AST: 18 U/L (ref 0–37)
Albumin: 3.5 g/dL (ref 3.5–5.2)
BILIRUBIN TOTAL: 0.5 mg/dL (ref 0.3–1.2)
BUN: 10 mg/dL (ref 6–23)
CHLORIDE: 99 meq/L (ref 96–112)
CO2: 32 meq/L (ref 19–32)
Calcium: 9.3 mg/dL (ref 8.4–10.5)
Creatinine, Ser: 0.64 mg/dL (ref 0.50–1.10)
GLUCOSE: 123 mg/dL — AB (ref 70–99)
POTASSIUM: 3.6 meq/L — AB (ref 3.7–5.3)
SODIUM: 141 meq/L (ref 137–147)
Total Protein: 6.9 g/dL (ref 6.0–8.3)

## 2013-10-27 LAB — HEMOGLOBIN A1C
HEMOGLOBIN A1C: 7 % — AB (ref <5.7)
MEAN PLASMA GLUCOSE: 154 mg/dL — AB (ref <117)

## 2013-10-27 LAB — IRON AND TIBC
Iron: 38 ug/dL — ABNORMAL LOW (ref 42–135)
Saturation Ratios: 11 % — ABNORMAL LOW (ref 20–55)
TIBC: 336 ug/dL (ref 250–470)
UIBC: 298 ug/dL (ref 125–400)

## 2013-10-27 LAB — RPR: RPR Ser Ql: NONREACTIVE

## 2013-10-27 MED ORDER — INSULIN ASPART 100 UNIT/ML ~~LOC~~ SOLN: 0.0000 [IU] | Freq: Three times a day (TID) | SUBCUTANEOUS | Status: DC

## 2013-10-27 MED ORDER — THIAMINE HCL 100 MG PO TABS: 100.0000 mg | ORAL_TABLET | Freq: Every day | ORAL | Status: DC

## 2013-10-27 MED ORDER — OXYCODONE-ACETAMINOPHEN 5-325 MG PO TABS: 1.5000 | ORAL_TABLET | Freq: Four times a day (QID) | ORAL | Status: DC | PRN

## 2013-10-27 MED ORDER — INSULIN ASPART 100 UNIT/ML ~~LOC~~ SOLN: 0.0000 [IU] | Freq: Every day | SUBCUTANEOUS | Status: DC

## 2013-10-27 MED ORDER — ONDANSETRON HCL 4 MG PO TABS: 4.0000 mg | ORAL_TABLET | Freq: Four times a day (QID) | ORAL | Status: DC | PRN

## 2013-10-27 MED ORDER — BUDESONIDE-FORMOTEROL FUMARATE 160-4.5 MCG/ACT IN AERO: 2.0000 | INHALATION_SPRAY | Freq: Two times a day (BID) | RESPIRATORY_TRACT | Status: DC

## 2013-10-27 MED ORDER — DIGOXIN 250 MCG PO TABS: 0.2500 mg | ORAL_TABLET | Freq: Every day | ORAL | Status: DC

## 2013-10-27 NOTE — Progress Notes (Signed)
Pt discharged home today per Dr. Verlon Au with home health. Pt's IV site D/C'd and WNL. Pt's VS stable at this time. Pt provided with home medication list, discharge instructions and prescriptions.  Refused to take SQ insulin at home. Dr. Verlon Au paged and made aware.  Verbalized understanding.  Pt verbalized understanding of before mentioned education. Pt left floor via WC in stable condition accompanied by NT.

## 2013-10-27 NOTE — Discharge Summary (Signed)
Physician Discharge Summary  Mary [name] [id number] DOB: 1951-05-17 DOA: 10/25/2013  PCP: Vic Blackbird, MD  Admit date: 10/25/2013 Discharge date: 10/27/2013  Time spent: 40 minutes  Recommendations for Outpatient Follow-up:  1. Needs TFT's followed as OP-might need endocrine referral and I have asked patient to already follow with Dr. Dorris Fetch  2. Needs OP psychiatric follow up 3. Advised Social worker to look into living situation as patient is living currently with granddaughter 4. Needs routine complete metabolic panel, CBC in about a week  Discharge Diagnoses:  Principal Problem:   Delirium, acute Active Problems:   Diabetes mellitus   COPD (chronic obstructive pulmonary disease)   Anxiety   Depression   Diastolic congestive heart failure   Atrial fibrillation   Hypoxia   Acute delirium   Discharge Condition: stable  Diet recommendation: Diabetic diet  Filed Weights   10/25/13 1828 10/26/13 0500  Weight: 64.864 kg (143 lb) 63.776 kg (140 lb 9.6 oz)    History of present illness:  63 y.o. ?, known perm Afib CHad2Vasc2 score~4, on Pradaxa, Htn, DM ty 2, COPD, recently d/c from hospital 09/18/13 with Afib c RVR + CP-Hypotension and diuretics stopped that admit, presented first 12/29 with non-specific diarrhea/N to ED and was triaged home with some phenergan-she took 2 doses and re-presented to [facility] ed 12/31 with delirium, thought hypoxic in nature  She was admitted with what was thought to be digoxin toxicity however her digoxin level was 1.4 uric albumin is normal so this is a real level.  She admits to taking some more of her regular Percocet recently and I am not completely sure she did not take more Phenergan then she was supposed to take and from her emergency room visit on the 29th  She is very tangential and talks continuously about things not related to what I am trying to ask and has to be redirected at least 10 times. She had a limited workup for delirium  including a non-reactive RPR, nonreactive HIV-curiously her TSH was 0.105 Given she came in with an acute issue and likely metabolic stress could have caused her TSH to be depressed as TSH in 2001 2014 were low normal It was thought prudent to rule out any structural abnormality enhanced MRI of the brain was performed on 10/27/13 showing no acute abnormality however moderate nonspecific white matter changes Her social situation was found to be interesting and social work was asked to look into safe. Patient contacted her sister who currently lives in [place] to come and stay with her. Ultimately it was thought that patient had iatrogenic self inflicted confusional state from probably taking too much of her Phenergan and her opiates. I tried to call her primary care physician Dr. Buelah Manis but the office is closed today. She will need close followup with both psychiatry as well as with endocrinology and her primary care physician to coordinate care   Discharge Exam: Filed Vitals:   10/27/13 1253  BP: 133/69  Pulse: 59  Temp: 99.6 F (37.6 C)  Resp: 20   Tangential, increased work output, pleasant Slightly unsteady on her feet but walks back and forth 10 steps without any gross unsteadiness noted. General: EOMI, NCAT Cardiovascular: S1-S2 atrial fibrillation rate controlled on monitor Respiratory: Clinically clear  Discharge Instructions  Discharge Orders   Future Appointments Provider Department Dept Phone   11/06/2013 2:30 PM Alycia Rossetti, MD [place] [phone]   Future Orders Complete By Expires   Diet -  low sodium heart healthy  As directed    Discharge instructions  As directed    Comments:     See Dr. Buelah Manis as an OP for consideration of psychiatry referral for mania You might need to see an endocrinologist for a decreased TSH-you might have thyroid issues causing increasing anxiety Please see a psychiatrist   Increase activity slowly  As directed         Medication List    STOP taking these medications       dabigatran 150 MG Caps capsule  Commonly known as:  PRADAXA     oxyCODONE-acetaminophen 7.5-325 MG per tablet  Commonly known as:  PERCOCET  Replaced by:  oxyCODONE-acetaminophen 5-325 MG per tablet     pravastatin 80 MG tablet  Commonly known as:  PRAVACHOL     promethazine 12.5 MG tablet  Commonly known as:  PHENERGAN      TAKE these medications       ALPRAZolam 1 MG tablet  Commonly known as:  XANAX  Take 1 tablet (1 mg total) by mouth 3 (three) times daily as needed for anxiety.     aspirin EC 81 MG tablet  Take 81 mg by mouth daily.     atorvastatin 20 MG tablet  Commonly known as:  LIPITOR  Take 20 mg by mouth daily.     budesonide-formoterol 160-4.5 MCG/ACT inhaler  Commonly known as:  SYMBICORT  Inhale 2 puffs into the lungs 2 (two) times daily.     digoxin 0.25 MG tablet  Commonly known as:  LANOXIN  Take 1 tablet (0.25 mg total) by mouth daily.     FLUoxetine 20 MG capsule  Commonly known as:  PROZAC  Take 20 mg by mouth daily.     furosemide 40 MG tablet  Commonly known as:  LASIX  Take 20 mg by mouth 2 (two) times daily. Take 1/2 tablet twice daily     insulin aspart 100 UNIT/ML injection  Commonly known as:  novoLOG  Inject 0-15 Units into the skin 3 (three) times daily with meals.     insulin aspart 100 UNIT/ML injection  Commonly known as:  novoLOG  Inject 0-5 Units into the skin at bedtime.     INVOKANA 100 MG Tabs  Generic drug:  Canagliflozin  Take 1 tablet by mouth daily.     JANUVIA 100 MG tablet  Generic drug:  sitaGLIPtin  take 1 tablet by mouth once daily     lisinopril 2.5 MG tablet  Commonly known as:  PRINIVIL,ZESTRIL  Take 1 tablet by mouth daily.     metoprolol tartrate 25 MG tablet  Commonly known as:  LOPRESSOR  Take 25 mg by mouth 2 (two) times daily.     ondansetron 4 MG tablet  Commonly known as:  ZOFRAN  Take 1 tablet (4 mg total) by mouth every 6  (six) hours as needed for nausea.     oxyCODONE-acetaminophen 5-325 MG per tablet  Commonly known as:  PERCOCET/ROXICET  Take 1.5 tablets by mouth every 6 (six) hours as needed for moderate pain.     potassium chloride 10 MEQ tablet  Commonly known as:  K-DUR  Take 1 tablet (10 mEq total) by mouth daily. With lasix     sodium chloride 0.65 % nasal spray  Commonly known as:  OCEAN  Place 1 spray into the nose as needed. Congestion     thiamine 100 MG tablet  Take 1 tablet (100 mg total) by mouth  daily.      ASK your doctor about these medications       calcium gluconate 500 MG tablet  Take 500 mg by mouth 2 (two) times daily.     multivitamin with minerals Tabs tablet  Take 1 tablet by mouth daily.     PROAIR HFA 108 (90 BASE) MCG/ACT inhaler  Generic drug:  albuterol  Inhale 1 puff into the lungs every 6 (six) hours as needed for wheezing or shortness of breath.       Allergies  Allergen Reactions  . Adhesive [Tape] Other (See Comments)    Tears skin off.   . Latex Other (See Comments)    In tape, tears skin off.  . Zocor [Simvastatin - High Dose] Nausea And Vomiting       Follow-up Information   Follow up with Vic Blackbird, MD.   Specialty:  Family Medicine   Contact information:   [address] [place] [address] [phone]       Follow up with NIDA,GEBRESELASSIE, MD. Schedule an appointment as soon as possible for a visit in 1 week.   Specialty:  Endocrinology   Contact information:   [place] [address] [phone]       Follow up with [facility]. Schedule an appointment as soon as possible for a visit in 1 week.   Specialty:  [facility] information:   [address] [place] [address] [phone]       The results of significant diagnostics from this hospitalization (including imaging, microbiology, ancillary and laboratory) are  listed below for reference.    Significant Diagnostic Studies: Dg Chest 2 View  10/25/2013   CLINICAL DATA:  Shortness of breath.  Weakness and dizziness.  EXAM: CHEST  2 VIEW  COMPARISON:  10/23/2013  FINDINGS: Atherosclerotic aortic arch. Heart size within normal limits. Suspected coronary atherosclerosis. The lungs appear clear.  IMPRESSION: 1. The lungs appear clear. 2. Atherosclerosis.   Electronically Signed   By: Sherryl Barters M.D.   On: 10/25/2013 20:18   Dg Chest 2 View  10/23/2013   CLINICAL DATA:  Shortness of breath  EXAM: CHEST  2 VIEW  COMPARISON:  09/18/2013  FINDINGS: The heart size and mediastinal contours are within normal limits. Both lungs are clear. The visualized skeletal structures are unremarkable.  IMPRESSION: No active cardiopulmonary disease.   Electronically Signed   By: Inez Catalina M.D.   On: 10/23/2013 11:59   Ct Head Wo Contrast  10/25/2013   CLINICAL DATA:  Altered mental status. Confusion. Shortness of breath.  EXAM: CT HEAD WITHOUT CONTRAST  TECHNIQUE: Contiguous axial images were obtained from the base of the skull through the vertex without intravenous contrast.  COMPARISON:  09/15/2013  FINDINGS: The brainstem, cerebellum, cerebral peduncles, thalamus, basal ganglia, basilar cisterns, and ventricular system appear within normal limits. Periventricular white matter and corona radiata hypodensities favor chronic ischemic microvascular white matter disease. No intracranial hemorrhage, mass lesion, or acute CVA.  IMPRESSION: 1. No acute intracranial findings. 2. Periventricular white matter and corona radiata hypodensities favor chronic ischemic microvascular white matter disease.   Electronically Signed   By: Sherryl Barters M.D.   On: 10/25/2013 20:27   Ct Angio Chest W/cm &/or Wo Cm  10/25/2013   CLINICAL DATA:  Shortness of breath.  Altered mental status.  EXAM: CT ANGIOGRAPHY CHEST WITH CONTRAST  TECHNIQUE: Multidetector CT imaging of the chest  was  performed using the standard protocol during bolus administration of intravenous contrast. Multiplanar CT image reconstructions including MIPs were obtained to evaluate the vascular anatomy.  CONTRAST:  129mL OMNIPAQUE IOHEXOL 350 MG/ML SOLN  COMPARISON:  02/21/2007  FINDINGS: Technically adequate study with good opacification of the central and segmental pulmonary arteries. No focal filling defects are demonstrated. No evidence of significant pulmonary embolus.  Mild cardiac enlargement. Coronary artery calcifications. Normal caliber thoracic aorta with calcification. No evidence of dissection. No significant lymphadenopathy in the chest. Esophagus is mostly decompressed. There is a small esophageal hiatal hernia. No pleural effusion. Small calcification in the right thyroid gland without enlargement. Visualized portions of the upper abdominal organs are grossly unremarkable.  Evaluation of lung fields is limited due to respiratory motion artifact. Multiple bilateral pulmonary nodules are demonstrated, the largest probably on the right lower lobe measuring 6 mm diameter. These nodules were present on the previous study without apparent interval change. No focal consolidation or airspace disease in the lungs. No pneumothorax. Airways appear patent. Degenerative changes in the spine. No destructive bone lesions. There is irregularity of the mid sternum but is probably due to motion artifact.  Review of the MIP images confirms the above findings.  IMPRESSION: No evidence of significant pulmonary embolus. Stable small bilateral pulmonary nodules. Due to long-term stability, these are likely to be benign. No evidence of active pulmonary disease.   Electronically Signed   By: Lucienne Capers M.D.   On: 10/25/2013 22:44   Mr Brain Wo Contrast  10/27/2013   CLINICAL DATA:  63 year old female with confusion, altered mental status, syncope, delete iridium. Initial encounter.  EXAM: MRI HEAD WITHOUT CONTRAST  TECHNIQUE:  Multiplanar, multiecho pulse sequences of the brain and surrounding structures were obtained without intravenous contrast.  COMPARISON:  Head CTs 10/25/2013 and earlier.  FINDINGS: Cerebral volume is normal. No restricted diffusion to suggest acute infarction. No midline shift, mass effect, evidence of mass lesion, ventriculomegaly, extra-axial collection or acute intracranial hemorrhage. Cervicomedullary junction and pituitary are within normal limits. Negative visualized cervical spine. Major intracranial vascular flow voids are preserved.  Patchy, nonspecific cerebral white matter T2 and FLAIR hyperintensity. Extent is moderate for age. No cortical encephalomalacia. Mild T2 heterogeneity in the pons. Deep gray matter nuclei and cerebellum within normal limits.  Visualized orbit soft tissues are within normal limits. Visualized paranasal sinuses and mastoids are clear. Normal bone marrow signal. Incidental asymmetric pneumatization of the left anterior clinoid process and right petrous apex. Normal bilateral cisternal and intracanalicular 7th and 8th cranial nerves segments. Visible internal auditory structures appear normal. Visualized scalp soft tissues are within normal limits.  IMPRESSION: 1.  No acute intracranial abnormality. 2. Moderate nonspecific white matter signal changes.   Electronically Signed   By: Lars Pinks M.D.   On: 10/27/2013 11:46    Microbiology: No results found for this or any previous visit (from the past 240 hour(s)).   Labs: Basic Metabolic Panel:  Recent Labs Lab 10/23/13 1214 10/25/13 2058 10/27/13 0550  NA 133* 136* 141  K 3.7 3.6* 3.6*  CL 94* 94* 99  CO2 24 25 32  GLUCOSE 165* 131* 123*  BUN 7 10 10   CREATININE 0.61 0.59 0.64  CALCIUM 10.1 9.3 9.3   Liver Function Tests:  Recent Labs Lab 10/23/13 1214 10/25/13 2058 10/27/13 0550  AST 21 18 18   ALT 13 12 10   ALKPHOS 74 66 57  BILITOT 0.8 0.7 0.5  PROT 8.4* 7.6 6.9  ALBUMIN 4.3  3.9 3.5   No results  found for this basename: LIPASE, AMYLASE,  in the last 168 hours  Recent Labs Lab 10/26/13 0059  AMMONIA 18   CBC:  Recent Labs Lab 10/23/13 1214 10/25/13 2058 10/27/13 0550  WBC 12.1* 11.2* 8.0  NEUTROABS 9.6* 9.9*  --   HGB 12.1 11.4* 12.1  HCT 35.6* 34.6* 37.0  MCV 89.9 90.6 91.8  PLT 193 182 202   Cardiac Enzymes:  Recent Labs Lab 10/23/13 1214 10/25/13 2058  TROPONINI <0.30 <0.30   BNP: BNP (last 3 results)  Recent Labs  09/15/13 1601 09/18/13 1630 10/23/13 1214  PROBNP 2719.0* 4591.0* 7918.0*   CBG:  Recent Labs Lab 10/26/13 1148 10/26/13 1633 10/26/13 2136 10/27/13 0731 10/27/13 1156  GLUCAP 103* 136* 123* 97 183*       Signed:  Nahima Ales, JAI-GURMUKH  [facility] 10/27/2013, 2:25 PM

## 2013-10-27 NOTE — Evaluation (Signed)
Physical Therapy Evaluation Patient Details Name: Mary [name] MRN: [id number] DOB: Jan 07, 1951 Today's Date: 10/27/2013 Time: [id number] PT Time Calculation (min): 17 min  PT Assessment / Plan / Recommendation History of Present Illness  This is a 63 year old pt admitted with delerium of unknown etiology.  It has resolved and pt is alert and oriented.  She has  a hx of Afib, DM, anxiety/depression and has had some social problems to deal with.  Clinical Impression   Pt was seen for evaluation prior to d/c.  She was found to be independent with all mobility.      PT Assessment  Patent does not need any further PT services    Follow Up Recommendations  No PT follow up    Does the patient have the potential to tolerate intense rehabilitation      Barriers to Discharge        Equipment Recommendations  None recommended by PT    Recommendations for Other Services     Frequency      Precautions / Restrictions Precautions Precautions: None Restrictions Weight Bearing Restrictions: No   Pertinent Vitals/Pain       Mobility  Bed Mobility Bed Mobility: Supine to Sit Supine to Sit: 7: Independent Transfers Transfers: Sit to Stand;Stand to Sit Sit to Stand: 7: Independent Stand to Sit: 7: Independent Ambulation/Gait Ambulation/Gait Assistance: 7: Independent Ambulation Distance (Feet): 250 Feet Assistive device: None Gait Pattern: Within Functional Limits Gait velocity: WNL Stairs: No Wheelchair Mobility Wheelchair Mobility: No    Exercises     PT Diagnosis:    PT Problem List:   PT Treatment Interventions:       PT Goals(Current goals can be found in the care plan section) Acute Rehab PT Goals PT Goal Formulation: No goals set, d/c therapy  Visit Information  Last PT Received On: 10/27/13 History of Present Illness: This is a 63 year old pt admitted with delerium of unknown etiology.  It has resolved and pt is alert and oriented.  She has  a hx of Afib, DM,  anxiety/depression and has had some social problems to deal with.       Prior [place] expects to be discharged to:: Private residence Living Arrangements: Children Available Help at Discharge: Family    Cognition  Cognition Arousal/Alertness: Awake/alert Behavior During Therapy: WFL for tasks assessed/performed Overall Cognitive Status: Within Functional Limits for tasks assessed    Extremity/Trunk Assessment Lower Extremity Assessment Lower Extremity Assessment: Overall WFL for tasks assessed   Balance Balance Balance Assessed: Yes High Level Balance High Level Balance Activites: Backward walking;Direction changes;Turns;Sudden stops;Head turns;Side stepping High Level Balance Comments: no LOB  End of Session PT - End of Session Equipment Utilized During Treatment: Gait belt Activity Tolerance: Patient tolerated treatment well Patient left: in bed;with call bell/phone within reach;with family/visitor present Nurse Communication: Mobility status  GP     Sable Feil 10/27/2013, 3:01 PM

## 2013-10-28 LAB — T3, FREE: T3, Free: 3 pg/mL (ref 2.3–4.2)

## 2013-10-28 LAB — T4, FREE: Free T4: 1.26 ng/dL (ref 0.80–1.80)

## 2013-11-01 ENCOUNTER — Other Ambulatory Visit: Payer: Self-pay | Admitting: Family Medicine

## 2013-11-01 ENCOUNTER — Encounter: Payer: Self-pay | Admitting: Family Medicine

## 2013-11-01 MED ORDER — POTASSIUM CHLORIDE ER 10 MEQ PO TBCR: 10.0000 meq | EXTENDED_RELEASE_TABLET | Freq: Every day | ORAL | Status: DC

## 2013-11-01 NOTE — Telephone Encounter (Signed)
Rx Refilled  

## 2013-11-02 ENCOUNTER — Telehealth: Payer: Self-pay | Admitting: Family Medicine

## 2013-11-02 NOTE — Telephone Encounter (Signed)
Mary [name] is calling from [facility] about this pt Call back number is [phone]

## 2013-11-03 NOTE — Telephone Encounter (Signed)
Called Miranda back and she wanted to let you know that she went over to pts house and pt refused services at this time..stating that she is moving and if she needed them she will let us know.

## 2013-11-03 NOTE — Telephone Encounter (Signed)
noted 

## 2013-11-06 ENCOUNTER — Ambulatory Visit (INDEPENDENT_AMBULATORY_CARE_PROVIDER_SITE_OTHER): Payer: Medicare Other | Admitting: Family Medicine

## 2013-11-06 ENCOUNTER — Encounter: Payer: Self-pay | Admitting: Family Medicine

## 2013-11-06 VITALS — BP 110/80 | HR 70 | Temp 97.8°F | Resp 18 | Ht 68.0 in | Wt 145.0 lb

## 2013-11-06 DIAGNOSIS — E119 Type 2 diabetes mellitus without complications: Secondary | ICD-10-CM

## 2013-11-06 DIAGNOSIS — F329 Major depressive disorder, single episode, unspecified: Secondary | ICD-10-CM

## 2013-11-06 DIAGNOSIS — F32A Depression, unspecified: Secondary | ICD-10-CM

## 2013-11-06 DIAGNOSIS — R946 Abnormal results of thyroid function studies: Secondary | ICD-10-CM

## 2013-11-06 DIAGNOSIS — F3289 Other specified depressive episodes: Secondary | ICD-10-CM

## 2013-11-06 DIAGNOSIS — R404 Transient alteration of awareness: Secondary | ICD-10-CM

## 2013-11-06 DIAGNOSIS — R410 Disorientation, unspecified: Secondary | ICD-10-CM

## 2013-11-06 DIAGNOSIS — I4891 Unspecified atrial fibrillation: Secondary | ICD-10-CM

## 2013-11-06 DIAGNOSIS — G8929 Other chronic pain: Secondary | ICD-10-CM

## 2013-11-06 LAB — COMPREHENSIVE METABOLIC PANEL
ALK PHOS: 66 U/L (ref 39–117)
ALT: 9 U/L (ref 0–35)
AST: 13 U/L (ref 0–37)
Albumin: 4.1 g/dL (ref 3.5–5.2)
BUN: 7 mg/dL (ref 6–23)
CHLORIDE: 98 meq/L (ref 96–112)
CO2: 31 mEq/L (ref 19–32)
Calcium: 10 mg/dL (ref 8.4–10.5)
Creat: 0.9 mg/dL (ref 0.50–1.10)
GLUCOSE: 106 mg/dL — AB (ref 70–99)
Potassium: 4.9 mEq/L (ref 3.5–5.3)
Sodium: 138 mEq/L (ref 135–145)
TOTAL PROTEIN: 7.2 g/dL (ref 6.0–8.3)
Total Bilirubin: 0.4 mg/dL (ref 0.3–1.2)

## 2013-11-06 LAB — CBC WITH DIFFERENTIAL/PLATELET
BASOS ABS: 0 10*3/uL (ref 0.0–0.1)
Basophils Relative: 0 % (ref 0–1)
Eosinophils Absolute: 0.2 10*3/uL (ref 0.0–0.7)
Eosinophils Relative: 2 % (ref 0–5)
HCT: 36 % (ref 36.0–46.0)
HEMOGLOBIN: 11.8 g/dL — AB (ref 12.0–15.0)
Lymphocytes Relative: 24 % (ref 12–46)
Lymphs Abs: 2.2 10*3/uL (ref 0.7–4.0)
MCH: 29.9 pg (ref 26.0–34.0)
MCHC: 32.8 g/dL (ref 30.0–36.0)
MCV: 91.4 fL (ref 78.0–100.0)
Monocytes Absolute: 0.7 10*3/uL (ref 0.1–1.0)
Monocytes Relative: 8 % (ref 3–12)
NEUTROS ABS: 6.2 10*3/uL (ref 1.7–7.7)
NEUTROS PCT: 66 % (ref 43–77)
Platelets: 222 10*3/uL (ref 150–400)
RBC: 3.94 MIL/uL (ref 3.87–5.11)
RDW: 17 % — ABNORMAL HIGH (ref 11.5–15.5)
WBC: 9.3 10*3/uL (ref 4.0–10.5)

## 2013-11-06 MED ORDER — ALPRAZOLAM 1 MG PO TABS: 1.0000 mg | ORAL_TABLET | Freq: Two times a day (BID) | ORAL | Status: DC | PRN

## 2013-11-06 MED ORDER — OXYCODONE-ACETAMINOPHEN 7.5-325 MG PO TABS: 1.0000 | ORAL_TABLET | Freq: Two times a day (BID) | ORAL | Status: DC | PRN

## 2013-11-06 MED ORDER — FLUOXETINE HCL 40 MG PO CAPS: 40.0000 mg | ORAL_CAPSULE | Freq: Every day | ORAL | Status: DC

## 2013-11-06 NOTE — Assessment & Plan Note (Signed)
This is the 2nd time pt has been admitted due to [place] and no other cause with exception of medications is the cause I discussed her medications Will decrease percocet to 1 tablet BID and xanax to 1 tablet BID Labs repeated

## 2013-11-06 NOTE — Progress Notes (Signed)
   Subjective:    Patient ID: Mary [name], female    DOB: [birth year], 63 y.o.   MRN: [id number]  HPI  Pt here to followup hospitalization. She was admitted for altered female status as well as some nausea vomiting. She is given a very different picture than what is described in the hospital records. She presented due to nausea per report. She states she was takening Phenergan tablets to help with this. The nursing note and physician notes states that she was very tangential and very confused. He was concerned that she's been taking too much pain medication. During her hospital admission there was a family friend and some family members that called states that she been acting very strange recently and had been going downhill since her daughter's death which was 3 years ago. There was also concern about the social living situation in which she states that she's been under a lot of stress as she moved in with her son and his wife and in the upper left her with the home and now she is trying to move out and she cannot afford it. She states that her family members are mean to her. She she states that she takes her pain medication Xanax as prescribed but rarely used it but then noted that she would have anxious around in case she did need them.    Review of Systems  GEN- denies fatigue, fever, weight loss,weakness, recent illness HEENT- denies eye drainage, change in vision, nasal discharge, CVS- denies chest pain, palpitations RESP- denies SOB, cough, wheeze ABD- denies N/V, change in stools, abd pain GU- denies dysuria, hematuria, dribbling, incontinence MSK- + joint pain, muscle aches, injury Neuro- denies headache, dizziness, syncope, seizure activity      Objective:   Physical Exam  GEN- NAD alert and oriented x3 ,  HEENT- PERRL, EOMI, oropharynx clear, MMM, CVS- Irregular, Irregular rhythm, normal rate RESP-CTAB, normal WOB ABD-NABS,soft,NT,ND EXT- No edema,  Pulse-, radial 2+ Skin-  bruising, right forearm,        Assessment & Plan:

## 2013-11-06 NOTE — Assessment & Plan Note (Addendum)
Rate controled on metoprolol I am not sure why pradaxa was stopped\    Update- I have discussed this with hospitality, there was concern about risk of fall, injury with her current state and being on the blood thinner, therefore pradaxa was stopped Risks outweighed benefits. She will continue ASA 81mg  I have adjusted some medications per above, and we will see how this affects her mental status and overall health.

## 2013-11-06 NOTE — Assessment & Plan Note (Signed)
Well controlled, A1C at goal continue januvia/invokana

## 2013-11-06 NOTE — Patient Instructions (Signed)
Schedule a visit with heart doctor for end of month I will call with lab results I will call about the blood thinner  Xanax twice a day  Percocet decreased to twice a day Prozac increased to 40mg  F/U 4 weeks

## 2013-11-06 NOTE — Assessment & Plan Note (Signed)
Per above, change in Percocet

## 2013-11-06 NOTE — Assessment & Plan Note (Signed)
Increase prozac ot 40mg  She will reschedule psych appt, not able to go at this time Note she also declined [place] at this time

## 2013-11-07 ENCOUNTER — Telehealth: Payer: Self-pay | Admitting: Family Medicine

## 2013-11-07 LAB — TSH: TSH: 0.715 u[IU]/mL (ref 0.350–4.500)

## 2013-11-07 NOTE — Telephone Encounter (Signed)
Pt sister is concerned about her, "she is out of control" Pt sister states that she is not living with her!  Call back number is [phone]

## 2013-11-08 NOTE — Telephone Encounter (Signed)
Contacted sister she feels that the pt has went "crazy" since last visit with Dr. Buelah Manis, states she is out of control, pt sister is trying to get pt out of house into another apartment and feels if she does move that will help her moods some., sister says that pt is talking about dying and wants to die and talks off the wall sometimes. Pt sister Velva Harman) is wanting to know if someone can do some test on her to see if she has dementa of Bipolar, I told her that we will have to talk with pt about this and that we cant just take test without pt consent.

## 2013-11-13 ENCOUNTER — Ambulatory Visit (HOSPITAL_COMMUNITY): Payer: Medicare Other | Admitting: Psychiatry

## 2013-11-14 ENCOUNTER — Telehealth: Payer: Self-pay | Admitting: Family Medicine

## 2013-11-14 NOTE — Telephone Encounter (Signed)
No phenergan, I discussed this with her during the visit She has zofran instead, less sedating

## 2013-11-14 NOTE — Telephone Encounter (Signed)
Requesting refill Phenergan 12.5 mg Q6hr prn nausea  #30  Last RF date 10/23/13

## 2013-11-22 ENCOUNTER — Other Ambulatory Visit: Payer: Self-pay | Admitting: Family Medicine

## 2013-11-22 MED ORDER — POTASSIUM CHLORIDE ER 10 MEQ PO TBCR: 10.0000 meq | EXTENDED_RELEASE_TABLET | Freq: Every day | ORAL | Status: DC

## 2013-11-22 NOTE — Telephone Encounter (Signed)
Rx Refilled  

## 2013-11-27 ENCOUNTER — Telehealth: Payer: Self-pay | Admitting: *Deleted

## 2013-11-27 MED ORDER — ATORVASTATIN CALCIUM 20 MG PO TABS: 20.0000 mg | ORAL_TABLET | Freq: Every day | ORAL | Status: DC

## 2013-11-27 NOTE — Telephone Encounter (Signed)
Pt aware to continue and med refilled

## 2013-11-27 NOTE — Telephone Encounter (Signed)
Yes, continue the atorvastatin/lipitor give 6 refills

## 2013-11-27 NOTE — Telephone Encounter (Signed)
Pt called stating she is needing refills on her atorvastatin and called her pharmacy to see if she can get it refilled and they had told her that it had been refused by provider, i looked in her med list and saw where she is taking med but you did not prescribe it. Do you want her to stop taking or continue with it. PLease advise!!

## 2013-11-28 ENCOUNTER — Telehealth: Payer: Self-pay | Admitting: *Deleted

## 2013-11-28 MED ORDER — METOPROLOL TARTRATE 25 MG PO TABS: 25.0000 mg | ORAL_TABLET | Freq: Two times a day (BID) | ORAL | Status: DC

## 2013-11-28 NOTE — Telephone Encounter (Signed)
Refills sent to pharmacy. 

## 2013-11-28 NOTE — Telephone Encounter (Signed)
Pt stated that she switched pharmacy's and [facility] needs a new Rx on Metoprolol 25 mg.   TK  [facility] in [place] [phone]

## 2013-12-04 ENCOUNTER — Telehealth: Payer: Self-pay | Admitting: *Deleted

## 2013-12-04 MED ORDER — SITAGLIPTIN PHOSPHATE 100 MG PO TABS: ORAL_TABLET | ORAL | Status: DC

## 2013-12-04 NOTE — Telephone Encounter (Signed)
Meds refilled.

## 2013-12-06 ENCOUNTER — Encounter: Payer: Self-pay | Admitting: Family Medicine

## 2013-12-06 ENCOUNTER — Ambulatory Visit (INDEPENDENT_AMBULATORY_CARE_PROVIDER_SITE_OTHER): Payer: Medicare Other | Admitting: Family Medicine

## 2013-12-06 VITALS — BP 118/60 | HR 64 | Temp 97.7°F | Resp 18 | Ht 68.0 in | Wt 144.0 lb

## 2013-12-06 DIAGNOSIS — G8929 Other chronic pain: Secondary | ICD-10-CM

## 2013-12-06 DIAGNOSIS — R410 Disorientation, unspecified: Secondary | ICD-10-CM

## 2013-12-06 DIAGNOSIS — R404 Transient alteration of awareness: Secondary | ICD-10-CM

## 2013-12-06 DIAGNOSIS — I4891 Unspecified atrial fibrillation: Secondary | ICD-10-CM

## 2013-12-06 MED ORDER — ALPRAZOLAM 1 MG PO TABS: 1.0000 mg | ORAL_TABLET | Freq: Two times a day (BID) | ORAL | Status: DC | PRN

## 2013-12-06 MED ORDER — FUROSEMIDE 40 MG PO TABS: 20.0000 mg | ORAL_TABLET | Freq: Two times a day (BID) | ORAL | Status: DC

## 2013-12-06 MED ORDER — OXYCODONE-ACETAMINOPHEN 7.5-325 MG PO TABS: 1.0000 | ORAL_TABLET | Freq: Two times a day (BID) | ORAL | Status: DC | PRN

## 2013-12-06 NOTE — Assessment & Plan Note (Signed)
Off pradaxa due to increase risk with multiple falls and recent AMS which she has been hospitalized twice for To see her primary cardiologist next week, will keep her on ASA

## 2013-12-06 NOTE — Assessment & Plan Note (Signed)
Resolved, I still think the pain meds and xanax were causing and issue, I will not change her dose, she will stay with low dose xanax and percocet She was not very happy with this decision but agreed

## 2013-12-06 NOTE — Patient Instructions (Signed)
Continue current medication Do not take Pradaxa No change to your xanax and percocet F/U 3 months

## 2013-12-06 NOTE — Progress Notes (Signed)
Patient ID: Mary [name], female   DOB: [birth year], 63 y.o.   MRN: [id number]   Subjective:    Patient ID: Mary [name], female    DOB: 1950-11-18, 63 y.o.   MRN: [id number]  Patient presents for hospital followup  Pt here to f/u last visit, was admitted for AMS, pradaxa stopped due to concern for fall and high risk of bleeding. At our last visit I increased prozac to 40mg  due to uncontrolled depression, she declined psychiatry and cancelled the appt.  Percocet and Xanax were also significantly decreased to BID dosing. Today she request increasing both xanax and percocet, but states she feels really good and she thinks it was the pradaxa making her altered and confused and causing abdominal pain.     Review Of Systems:  GEN- denies fatigue, fever, weight loss,weakness, recent illness HEENT- denies eye drainage, change in vision, nasal discharge, CVS- denies chest pain, palpitations RESP- denies SOB, cough, wheeze ABD- denies N/V, change in stools, abd pain MSK- + joint pain, muscle aches, injury Neuro- denies headache, dizziness, syncope, seizure activity       Objective:    BP 118/60  Pulse 64  Temp(Src) 97.7 F (36.5 C) (Oral)  Resp 18  Ht 5\' 8"  (1.727 m)  Wt 144 lb (65.318 kg)  BMI 21.90 kg/m2 GEN- NAD alert and oriented x3 ,  HEENT- PERRL, EOMI, oropharynx clear, MMM, CVS- Irregular, Irregular rhythm, normal rate RESP-CTAB, normal WOB EXT- No edema,  Psych- normal affect and mood, clear speech, gait at balance Pulse-, radial 2+       Assessment & Plan:      Problem List Items Addressed This Visit   None      Note: This dictation was prepared with Dragon dictation along with smaller phrase technology. Any transcriptional errors that result from this process are unintentional.

## 2013-12-06 NOTE — Assessment & Plan Note (Signed)
Per above, Percocet BID dosing, will not increase at this time

## 2013-12-13 ENCOUNTER — Ambulatory Visit: Payer: Medicare Other | Admitting: Cardiovascular Disease

## 2013-12-21 ENCOUNTER — Emergency Department (HOSPITAL_COMMUNITY): Payer: Medicare Other

## 2013-12-21 ENCOUNTER — Emergency Department (HOSPITAL_COMMUNITY)
Admission: EM | Admit: 2013-12-21 | Discharge: 2013-12-21 | Disposition: A | Discharge status: Home / Self Care | Payer: Medicare Other | Attending: Emergency Medicine | Admitting: Emergency Medicine

## 2013-12-21 ENCOUNTER — Encounter (HOSPITAL_COMMUNITY): Payer: Self-pay | Admitting: Emergency Medicine

## 2013-12-21 DIAGNOSIS — E785 Hyperlipidemia, unspecified: Secondary | ICD-10-CM | POA: Insufficient documentation

## 2013-12-21 DIAGNOSIS — Z79899 Other long term (current) drug therapy: Secondary | ICD-10-CM | POA: Insufficient documentation

## 2013-12-21 DIAGNOSIS — Z91040 Latex allergy status: Secondary | ICD-10-CM | POA: Insufficient documentation

## 2013-12-21 DIAGNOSIS — Z7982 Long term (current) use of aspirin: Secondary | ICD-10-CM | POA: Insufficient documentation

## 2013-12-21 DIAGNOSIS — I10 Essential (primary) hypertension: Secondary | ICD-10-CM | POA: Insufficient documentation

## 2013-12-21 DIAGNOSIS — Z87891 Personal history of nicotine dependence: Secondary | ICD-10-CM | POA: Insufficient documentation

## 2013-12-21 DIAGNOSIS — IMO0002 Reserved for concepts with insufficient information to code with codable children: Secondary | ICD-10-CM | POA: Insufficient documentation

## 2013-12-21 DIAGNOSIS — I4891 Unspecified atrial fibrillation: Secondary | ICD-10-CM | POA: Insufficient documentation

## 2013-12-21 DIAGNOSIS — J45901 Unspecified asthma with (acute) exacerbation: Secondary | ICD-10-CM

## 2013-12-21 DIAGNOSIS — R079 Chest pain, unspecified: Secondary | ICD-10-CM | POA: Insufficient documentation

## 2013-12-21 DIAGNOSIS — I509 Heart failure, unspecified: Secondary | ICD-10-CM | POA: Insufficient documentation

## 2013-12-21 DIAGNOSIS — M129 Arthropathy, unspecified: Secondary | ICD-10-CM | POA: Insufficient documentation

## 2013-12-21 DIAGNOSIS — J441 Chronic obstructive pulmonary disease with (acute) exacerbation: Secondary | ICD-10-CM | POA: Insufficient documentation

## 2013-12-21 DIAGNOSIS — G8929 Other chronic pain: Secondary | ICD-10-CM | POA: Insufficient documentation

## 2013-12-21 DIAGNOSIS — E119 Type 2 diabetes mellitus without complications: Secondary | ICD-10-CM | POA: Insufficient documentation

## 2013-12-21 DIAGNOSIS — F411 Generalized anxiety disorder: Secondary | ICD-10-CM | POA: Insufficient documentation

## 2013-12-21 LAB — COMPREHENSIVE METABOLIC PANEL
ALT: 10 U/L (ref 0–35)
AST: 18 U/L (ref 0–37)
Albumin: 4.1 g/dL (ref 3.5–5.2)
Alkaline Phosphatase: 91 U/L (ref 39–117)
BUN: 10 mg/dL (ref 6–23)
CO2: 28 mEq/L (ref 19–32)
Calcium: 10.1 mg/dL (ref 8.4–10.5)
Chloride: 93 mEq/L — ABNORMAL LOW (ref 96–112)
Creatinine, Ser: 0.63 mg/dL (ref 0.50–1.10)
GFR calc Af Amer: >90 mL/min (ref >90)
Glucose, Bld: 189 mg/dL — ABNORMAL HIGH (ref 70–99)
Potassium: 4.3 mEq/L (ref 3.7–5.3)
SODIUM: 136 meq/L — AB (ref 137–147)
Total Bilirubin: 0.4 mg/dL (ref 0.3–1.2)
Total Protein: 8.5 g/dL — ABNORMAL HIGH (ref 6.0–8.3)

## 2013-12-21 LAB — CBC WITH DIFFERENTIAL/PLATELET
BASOS ABS: 0 10*3/uL (ref 0.0–0.1)
Basophils Relative: 0 % (ref 0–1)
EOS PCT: 1 % (ref 0–5)
Eosinophils Absolute: 0.1 10*3/uL (ref 0.0–0.7)
HCT: 42.6 % (ref 36.0–46.0)
Hemoglobin: 14.2 g/dL (ref 12.0–15.0)
LYMPHS PCT: 16 % (ref 12–46)
Lymphs Abs: 2.1 10*3/uL (ref 0.7–4.0)
MCH: 31.4 pg (ref 26.0–34.0)
MCHC: 33.3 g/dL (ref 30.0–36.0)
MCV: 94.2 fL (ref 78.0–100.0)
Monocytes Absolute: 0.8 10*3/uL (ref 0.1–1.0)
Monocytes Relative: 6 % (ref 3–12)
NEUTROS ABS: 10.1 10*3/uL — AB (ref 1.7–7.7)
Neutrophils Relative %: 77 % (ref 43–77)
PLATELETS: 240 10*3/uL (ref 150–400)
RBC: 4.52 MIL/uL (ref 3.87–5.11)
RDW: 14 % (ref 11.5–15.5)
WBC: 13.1 10*3/uL — AB (ref 4.0–10.5)

## 2013-12-21 LAB — D-DIMER, QUANTITATIVE: D-Dimer, Quant: 0.58 ug/mL-FEU — ABNORMAL HIGH (ref 0.00–0.48)

## 2013-12-21 LAB — TROPONIN I: Troponin I: <0.3 ng/mL (ref <0.30)

## 2013-12-21 MED ORDER — IOHEXOL 350 MG/ML SOLN
100.0000 mL | Freq: Once | INTRAVENOUS | Status: AC | PRN
  Administered 2013-12-21: 100 mL via INTRAVENOUS

## 2013-12-21 MED ORDER — HYDROMORPHONE HCL 4 MG PO TABS: 4.0000 mg | ORAL_TABLET | Freq: Four times a day (QID) | ORAL | Status: DC | PRN

## 2013-12-21 MED ORDER — MORPHINE SULFATE 4 MG/ML IJ SOLN
4.0000 mg | Freq: Once | INTRAMUSCULAR | Status: AC
  Administered 2013-12-21: 4 mg via INTRAVENOUS
  Filled 2013-12-21: qty 1

## 2013-12-21 NOTE — ED Notes (Signed)
Pt c/o generalized chest pain and SOB since last night. Pt also reports intermittent nausea and lightheadedness. Pt has hx of a-fib.

## 2013-12-21 NOTE — Discharge Instructions (Signed)
Follow up with your family md or [facility] md in 1-2 weeks.

## 2013-12-21 NOTE — ED Provider Notes (Signed)
CSN: [id number]     Arrival date & time 12/21/13  1735 History   First MD Initiated Contact with Patient 12/21/13 1742     Chief Complaint  Patient presents with  . Chest Pain  . Shortness of Breath     (Consider location/radiation/quality/duration/timing/severity/associated sxs/prior Treatment) Patient is a 63 y.o. female presenting with chest pain and shortness of breath. The history is provided by the patient (the pt complains of chest pain.).  Chest Pain Pain location:  Substernal area Pain quality: aching   Pain radiates to:  Does not radiate Pain radiates to the back: no   Pain severity:  Moderate Onset quality:  Gradual Timing:  Constant Progression:  Unchanged Chronicity:  Recurrent Context: not breathing   Associated symptoms: no abdominal pain, no back pain, no cough, no fatigue and no headache   Shortness of Breath Associated symptoms: chest pain   Associated symptoms: no abdominal pain, no cough, no headaches and no rash     Past Medical History  Diagnosis Date  . Diabetes mellitus   . Arthritis   . Anxiety   . Chronic pain     Bacl pain, Disc L5-S1- Dr. Joya Salm in [place]  . Hyperlipidemia   . Hypertension   . Tachycardia   . Bronchial asthma   . Atrial fibrillation   . COPD (chronic obstructive pulmonary disease)   . DDD (degenerative disc disease)     Radicular symptoms  . Shortness of breath   . CHF (congestive heart failure)   . Hx of echocardiogram     Was interpreted by Dr Doylene Canard that showed an Ef in the 50-60% range with grade 1 diastolic dysfunction. she had moderate MR, biatrial enlargement, moderate TR and at that time estimated RV systolic pressure was 43 mm.  . History of stress test 06/2011    Abnormal myocardial perfusion study.   Past Surgical History  Procedure Laterality Date  . Carpal tunnel release      x 2  . Breast cyst removed    . Abdominal hysterectomy      partial  . Tubal ligation    . Tonsillectomy     Family History   Problem Relation Age of Onset  . Diabetes Mother   . Heart disease Mother   . Diabetes Sister   . Hypertension Sister   . Hyperlipidemia Sister   . Diabetes Brother   . Heart disease Brother   . Diabetes Brother   . Heart disease Brother    History  Substance Use Topics  . Smoking status: Former Smoker    Types: Cigarettes  . Smokeless tobacco: Not on file     Comment: quit 6 months ago  . Alcohol Use: No   OB History   Grav Para Term Preterm Abortions TAB SAB Ect Mult Living                 Review of Systems  Constitutional: Negative for appetite change and fatigue.  HENT: Negative for congestion, ear discharge and sinus pressure.   Eyes: Negative for discharge.  Respiratory: Negative for cough.   Cardiovascular: Positive for chest pain.  Gastrointestinal: Negative for abdominal pain and diarrhea.  Genitourinary: Negative for frequency and hematuria.  Musculoskeletal: Negative for back pain.  Skin: Negative for rash.  Neurological: Negative for seizures and headaches.  Psychiatric/Behavioral: Negative for hallucinations.      Allergies  Adhesive; Latex; and Zocor  Home Medications   Current Outpatient Rx  Name  Route  Sig  Dispense  Refill  . albuterol (PROAIR HFA) 108 (90 BASE) MCG/ACT inhaler   Inhalation   Inhale 2 puffs into the lungs every 6 (six) hours as needed for wheezing or shortness of breath.          Marland Kitchen aspirin EC 81 MG tablet   Oral   Take 81 mg by mouth daily.         Marland Kitchen atorvastatin (LIPITOR) 20 MG tablet   Oral   Take 1 tablet (20 mg total) by mouth daily.   30 tablet   6   . budesonide-formoterol (SYMBICORT) 160-4.5 MCG/ACT inhaler   Inhalation   Inhale 2 puffs into the lungs 2 (two) times daily.   1 Inhaler   12   . digoxin (LANOXIN) 0.25 MG tablet   Oral   Take 1 tablet (0.25 mg total) by mouth daily.   30 tablet   6   . FLUoxetine (PROZAC) 40 MG capsule   Oral   Take 1 capsule (40 mg total) by mouth daily.   30  capsule   3   . furosemide (LASIX) 40 MG tablet   Oral   Take 0.5 tablets (20 mg total) by mouth 2 (two) times daily.   30 tablet   3   . INVOKANA 100 MG TABS   Oral   Take 1 tablet by mouth daily.         Marland Kitchen lisinopril (PRINIVIL,ZESTRIL) 2.5 MG tablet   Oral   Take 1 tablet by mouth daily.         . metoprolol tartrate (LOPRESSOR) 25 MG tablet   Oral   Take 1 tablet (25 mg total) by mouth 2 (two) times daily. Keep appointment for refills.   60 tablet   0   . ondansetron (ZOFRAN) 4 MG tablet   Oral   Take 1 tablet (4 mg total) by mouth every 6 (six) hours as needed for nausea.   20 tablet   0   . oxyCODONE-acetaminophen (PERCOCET) 7.5-325 MG per tablet   Oral   Take 1 tablet by mouth 2 (two) times daily as needed for pain.   60 tablet   0   . potassium chloride (K-DUR) 10 MEQ tablet   Oral   Take 1 tablet (10 mEq total) by mouth daily. With lasix   30 tablet   3   . sitaGLIPtin (JANUVIA) 100 MG tablet      take 1 tablet by mouth once daily   30 tablet   3   . thiamine 100 MG tablet   Oral   Take 1 tablet (100 mg total) by mouth daily.   30 tablet   0   . ALPRAZolam (XANAX) 1 MG tablet   Oral   Take 1 tablet (1 mg total) by mouth 2 (two) times daily as needed for anxiety.   60 tablet   0   . HYDROmorphone (DILAUDID) 4 MG tablet   Oral   Take 1 tablet (4 mg total) by mouth every 6 (six) hours as needed for severe pain.   15 tablet   0   . sodium chloride (OCEAN) 0.65 % nasal spray   Nasal   Place 1 spray into the nose as needed. Congestion          BP 129/70  Pulse 77  Temp(Src) 98.2 F (36.8 C) (Oral)  Resp 22  Ht 5\' 7"  (1.702 m)  Wt 142 lb (64.411 kg)  BMI 22.24 kg/m2  SpO2 93% Physical Exam  Constitutional: She is oriented to person, place, and time. She appears well-developed.  HENT:  Head: Normocephalic.  Eyes: Conjunctivae and EOM are normal. No scleral icterus.  Neck: Neck supple. No thyromegaly present.  Cardiovascular:  Normal rate and regular rhythm.  Exam reveals no gallop and no friction rub.   No murmur heard. Pulmonary/Chest: No stridor. She has no wheezes. She has no rales. She exhibits tenderness.  Abdominal: She exhibits no distension. There is no tenderness. There is no rebound.  Musculoskeletal: Normal range of motion. She exhibits no edema.  Lymphadenopathy:    She has no cervical adenopathy.  Neurological: She is oriented to person, place, and time. She exhibits normal muscle tone. Coordination normal.  Skin: No rash noted. No erythema.  Psychiatric: She has a normal mood and affect. Her behavior is normal.    ED Course  Procedures (including critical care time) Labs Review Labs Reviewed  CBC WITH DIFFERENTIAL - Abnormal; Notable for the following:    WBC 13.1 (*)    Neutro Abs 10.1 (*)    All other components within normal limits  COMPREHENSIVE METABOLIC PANEL - Abnormal; Notable for the following:    Sodium 136 (*)    Chloride 93 (*)    Glucose, Bld 189 (*)    Total Protein 8.5 (*)    All other components within normal limits  D-DIMER, QUANTITATIVE - Abnormal; Notable for the following:    D-Dimer, Quant 0.58 (*)    All other components within normal limits  TROPONIN I  TROPONIN I   Imaging Review Ct Angio Chest Pe W/cm &/or Wo Cm  12/21/2013   CLINICAL DATA:  Chest pain and shortness of breath  EXAM: CT ANGIOGRAPHY CHEST WITH CONTRAST  TECHNIQUE: Multidetector CT imaging of the chest was performed using the standard protocol during bolus administration of intravenous contrast. Multiplanar CT image reconstructions and MIPs were obtained to evaluate the vascular anatomy.  CONTRAST:  156mL OMNIPAQUE IOHEXOL 350 MG/ML SOLN  COMPARISON:  10/25/2013  FINDINGS: Pulmonary arteries are well visualized. No filling defect or significant pulmonary embolus demonstrated by CTA. Atherosclerotic changes of the thoracic aorta the. Negative for aneurysm or dissection. Normal heart size. No pericardial  pleural effusion.  No significant adenopathy. Stable prominent mediastinal lymph nodes without definite enlargement. No supraclavicular or axillary adenopathy.  Included upper abdomen demonstrates no acute process.  Lung windows demonstrate apical scarring. No collapse, consolidation, pneumonia, acute airspace process, interstitial change, or edema. Trachea pain central airways are patent. Stable scattered bilateral pulmonary nodules, largest in the right lower lobe posteriorly measures 7 mm. These are stable dating back to 2008 compatible with benign nodules.  Review of the MIP images confirms the above findings.  IMPRESSION: Negative for significant acute pulmonary embolus by CTA.  Atherosclerotic changes of the aorta without aneurysm  No acute intra thoracic finding  Stable bilateral pulmonary nodules dating back to 2008, benign   Electronically Signed   By: Daryll Brod M.D.   On: 12/21/2013 20:05   Dg Chest Portable 1 View  12/21/2013   CLINICAL DATA:  Chest pain and shortness breath.  EXAM: PORTABLE CHEST - 1 VIEW  COMPARISON:  CT ANGIO CHEST W/CM &/OR WO/CM dated 10/25/2013; DG CHEST 2 VIEW dated 10/25/2013  FINDINGS: The heart size and mediastinal contours are within normal limits. Both lungs are clear. The visualized skeletal structures are unremarkable. No appreciable change from prior.  IMPRESSION: No active disease.   Electronically Signed   By:  Rolla Flatten M.D.   On: 12/21/2013 18:19    EKG Interpretation    Date/Time:  Thursday December 21 2013 17:46:15 EST Ventricular Rate:  83 PR Interval:    QRS Duration: 90 QT Interval:  362 QTC Calculation: 425 R Axis:   -6 Text Interpretation:  Atrial fibrillation Nonspecific ST and T wave abnormality Abnormal ECG When compared with ECG of 25-Oct-2013 19:27, Questionable change in QRS axis T wave inversion now evident in Inferior leads T wave inversion now evident in Anterior leads Confirmed by Meridian Scherger  MD, Jacobey Gura ([id number]) on 12/21/2013 9:42:10  PM            MDM   Final diagnoses:  Chest pain     Normal studies,  Pt improved will tx pain and close follow up   Maudry Diego, MD 12/21/13 2143

## 2013-12-22 ENCOUNTER — Other Ambulatory Visit: Payer: Self-pay | Admitting: *Deleted

## 2013-12-22 MED ORDER — GLUCOSE BLOOD VI STRP: ORAL_STRIP | Status: DC

## 2013-12-22 NOTE — Telephone Encounter (Signed)
Refill appropriate and filled per protocol. 

## 2013-12-25 ENCOUNTER — Telehealth: Payer: Self-pay | Admitting: Family Medicine

## 2013-12-25 MED ORDER — OXYCODONE-ACETAMINOPHEN 7.5-325 MG PO TABS: 1.0000 | ORAL_TABLET | Freq: Two times a day (BID) | ORAL | Status: DC | PRN

## 2013-12-25 MED ORDER — ALPRAZOLAM 1 MG PO TABS: 1.0000 mg | ORAL_TABLET | Freq: Two times a day (BID) | ORAL | Status: DC | PRN

## 2013-12-25 NOTE — Telephone Encounter (Signed)
Ok to refill?  Last office visit 12/06/2013.  Last refill 12/06/2013.

## 2013-12-25 NOTE — Telephone Encounter (Signed)
Call back number is [phone] Pt is needing percocet and xanax  Refilled She only has a ride for tomorrow

## 2013-12-25 NOTE — Telephone Encounter (Signed)
Patient is requesting to pick up script on 12/26/2013 due to transportation issues.

## 2013-12-25 NOTE — Telephone Encounter (Signed)
She can not get her meds until March 11th

## 2013-12-25 NOTE — Telephone Encounter (Signed)
Prescription printed and patient made aware to come to office to pick up.  

## 2013-12-25 NOTE — Telephone Encounter (Signed)
Okay to refill, put the date on it for 30 days since last fill

## 2013-12-27 ENCOUNTER — Other Ambulatory Visit: Payer: Self-pay

## 2013-12-27 MED ORDER — METOPROLOL TARTRATE 25 MG PO TABS: 25.0000 mg | ORAL_TABLET | Freq: Two times a day (BID) | ORAL | Status: DC

## 2013-12-27 NOTE — Telephone Encounter (Signed)
Rx was sent to pharmacy electronically. 

## 2014-01-06 ENCOUNTER — Inpatient Hospital Stay (HOSPITAL_COMMUNITY)
Admission: EM | Admit: 2014-01-06 | Discharge: 2014-01-15 | DRG: 233 | Disposition: A | Discharge status: Home Health | Payer: Medicare Other | Attending: Cardiothoracic Surgery | Admitting: Cardiothoracic Surgery

## 2014-01-06 ENCOUNTER — Inpatient Hospital Stay (HOSPITAL_COMMUNITY): Payer: Medicare Other

## 2014-01-06 ENCOUNTER — Emergency Department (HOSPITAL_COMMUNITY): Payer: Medicare Other

## 2014-01-06 ENCOUNTER — Encounter (HOSPITAL_COMMUNITY): Payer: Self-pay | Admitting: Emergency Medicine

## 2014-01-06 DIAGNOSIS — Z7982 Long term (current) use of aspirin: Secondary | ICD-10-CM

## 2014-01-06 DIAGNOSIS — F3289 Other specified depressive episodes: Secondary | ICD-10-CM | POA: Diagnosis present

## 2014-01-06 DIAGNOSIS — I10 Essential (primary) hypertension: Secondary | ICD-10-CM | POA: Diagnosis present

## 2014-01-06 DIAGNOSIS — F411 Generalized anxiety disorder: Secondary | ICD-10-CM | POA: Diagnosis present

## 2014-01-06 DIAGNOSIS — J441 Chronic obstructive pulmonary disease with (acute) exacerbation: Secondary | ICD-10-CM | POA: Diagnosis present

## 2014-01-06 DIAGNOSIS — D72829 Elevated white blood cell count, unspecified: Secondary | ICD-10-CM | POA: Diagnosis present

## 2014-01-06 DIAGNOSIS — Z833 Family history of diabetes mellitus: Secondary | ICD-10-CM

## 2014-01-06 DIAGNOSIS — Z9181 History of falling: Secondary | ICD-10-CM

## 2014-01-06 DIAGNOSIS — I2589 Other forms of chronic ischemic heart disease: Secondary | ICD-10-CM | POA: Diagnosis present

## 2014-01-06 DIAGNOSIS — J449 Chronic obstructive pulmonary disease, unspecified: Secondary | ICD-10-CM

## 2014-01-06 DIAGNOSIS — M5137 Other intervertebral disc degeneration, lumbosacral region: Secondary | ICD-10-CM | POA: Diagnosis present

## 2014-01-06 DIAGNOSIS — I5031 Acute diastolic (congestive) heart failure: Secondary | ICD-10-CM

## 2014-01-06 DIAGNOSIS — I498 Other specified cardiac arrhythmias: Secondary | ICD-10-CM | POA: Diagnosis not present

## 2014-01-06 DIAGNOSIS — J45901 Unspecified asthma with (acute) exacerbation: Secondary | ICD-10-CM

## 2014-01-06 DIAGNOSIS — I5043 Acute on chronic combined systolic (congestive) and diastolic (congestive) heart failure: Secondary | ICD-10-CM | POA: Diagnosis present

## 2014-01-06 DIAGNOSIS — Z951 Presence of aortocoronary bypass graft: Secondary | ICD-10-CM

## 2014-01-06 DIAGNOSIS — F329 Major depressive disorder, single episode, unspecified: Secondary | ICD-10-CM | POA: Diagnosis present

## 2014-01-06 DIAGNOSIS — M51379 Other intervertebral disc degeneration, lumbosacral region without mention of lumbar back pain or lower extremity pain: Secondary | ICD-10-CM | POA: Diagnosis present

## 2014-01-06 DIAGNOSIS — G8929 Other chronic pain: Secondary | ICD-10-CM | POA: Diagnosis present

## 2014-01-06 DIAGNOSIS — Z8249 Family history of ischemic heart disease and other diseases of the circulatory system: Secondary | ICD-10-CM

## 2014-01-06 DIAGNOSIS — R110 Nausea: Secondary | ICD-10-CM | POA: Diagnosis not present

## 2014-01-06 DIAGNOSIS — K5900 Constipation, unspecified: Secondary | ICD-10-CM | POA: Diagnosis not present

## 2014-01-06 DIAGNOSIS — E1169 Type 2 diabetes mellitus with other specified complication: Secondary | ICD-10-CM | POA: Diagnosis present

## 2014-01-06 DIAGNOSIS — E785 Hyperlipidemia, unspecified: Secondary | ICD-10-CM | POA: Diagnosis present

## 2014-01-06 DIAGNOSIS — I9589 Other hypotension: Secondary | ICD-10-CM | POA: Diagnosis not present

## 2014-01-06 DIAGNOSIS — F17200 Nicotine dependence, unspecified, uncomplicated: Secondary | ICD-10-CM | POA: Diagnosis present

## 2014-01-06 DIAGNOSIS — D62 Acute posthemorrhagic anemia: Secondary | ICD-10-CM | POA: Diagnosis not present

## 2014-01-06 DIAGNOSIS — E876 Hypokalemia: Secondary | ICD-10-CM | POA: Diagnosis not present

## 2014-01-06 DIAGNOSIS — Z602 Problems related to living alone: Secondary | ICD-10-CM

## 2014-01-06 DIAGNOSIS — I2510 Atherosclerotic heart disease of native coronary artery without angina pectoris: Secondary | ICD-10-CM | POA: Diagnosis present

## 2014-01-06 DIAGNOSIS — I739 Peripheral vascular disease, unspecified: Secondary | ICD-10-CM | POA: Diagnosis present

## 2014-01-06 DIAGNOSIS — R197 Diarrhea, unspecified: Secondary | ICD-10-CM | POA: Diagnosis not present

## 2014-01-06 DIAGNOSIS — I214 Non-ST elevation (NSTEMI) myocardial infarction: Principal | ICD-10-CM | POA: Diagnosis present

## 2014-01-06 DIAGNOSIS — I509 Heart failure, unspecified: Secondary | ICD-10-CM | POA: Diagnosis present

## 2014-01-06 DIAGNOSIS — Z9851 Tubal ligation status: Secondary | ICD-10-CM

## 2014-01-06 DIAGNOSIS — Z79899 Other long term (current) drug therapy: Secondary | ICD-10-CM

## 2014-01-06 DIAGNOSIS — I4891 Unspecified atrial fibrillation: Secondary | ICD-10-CM | POA: Diagnosis present

## 2014-01-06 DIAGNOSIS — R079 Chest pain, unspecified: Secondary | ICD-10-CM

## 2014-01-06 LAB — URINALYSIS, ROUTINE W REFLEX MICROSCOPIC
Bilirubin Urine: NEGATIVE
HGB URINE DIPSTICK: NEGATIVE
Ketones, ur: 15 mg/dL — AB
Leukocytes, UA: NEGATIVE
Nitrite: NEGATIVE
Protein, ur: NEGATIVE mg/dL
SPECIFIC GRAVITY, URINE: 1.01 (ref 1.005–1.030)
UROBILINOGEN UA: 0.2 mg/dL (ref 0.0–1.0)
pH: 6.5 (ref 5.0–8.0)

## 2014-01-06 LAB — COMPREHENSIVE METABOLIC PANEL
ALBUMIN: 3.7 g/dL (ref 3.5–5.2)
ALK PHOS: 83 U/L (ref 39–117)
ALT: 15 U/L (ref 0–35)
AST: 47 U/L — AB (ref 0–37)
BUN: 18 mg/dL (ref 6–23)
CALCIUM: 10.9 mg/dL — AB (ref 8.4–10.5)
CO2: 26 mEq/L (ref 19–32)
Chloride: 94 mEq/L — ABNORMAL LOW (ref 96–112)
Creatinine, Ser: 0.67 mg/dL (ref 0.50–1.10)
GFR calc Af Amer: >90 mL/min (ref >90)
GFR calc non Af Amer: >90 mL/min (ref >90)
GLUCOSE: 255 mg/dL — AB (ref 70–99)
POTASSIUM: 5.1 meq/L (ref 3.7–5.3)
SODIUM: 137 meq/L (ref 137–147)
Total Bilirubin: 0.4 mg/dL (ref 0.3–1.2)
Total Protein: 7.5 g/dL (ref 6.0–8.3)

## 2014-01-06 LAB — CBC WITH DIFFERENTIAL/PLATELET
Basophils Absolute: 0 10*3/uL (ref 0.0–0.1)
Basophils Absolute: 0 10*3/uL (ref 0.0–0.1)
Basophils Relative: 0 % (ref 0–1)
Basophils Relative: 0 % (ref 0–1)
Eosinophils Absolute: 0.1 10*3/uL (ref 0.0–0.7)
Eosinophils Absolute: 0 10*3/uL (ref 0.0–0.7)
Eosinophils Relative: 0 % (ref 0–5)
Eosinophils Relative: 0 % (ref 0–5)
HEMATOCRIT: 42.8 % (ref 36.0–46.0)
HEMATOCRIT: 42.5 % (ref 36.0–46.0)
HEMOGLOBIN: 13.5 g/dL (ref 12.0–15.0)
HEMOGLOBIN: 14.1 g/dL (ref 12.0–15.0)
LYMPHS ABS: 1.3 10*3/uL (ref 0.7–4.0)
LYMPHS PCT: 4 % — AB (ref 12–46)
Lymphocytes Relative: 8 % — ABNORMAL LOW (ref 12–46)
Lymphs Abs: 1 10*3/uL (ref 0.7–4.0)
MCH: 30.3 pg (ref 26.0–34.0)
MCH: 31.5 pg (ref 26.0–34.0)
MCHC: 31.5 g/dL (ref 30.0–36.0)
MCHC: 33.2 g/dL (ref 30.0–36.0)
MCV: 96.2 fL (ref 78.0–100.0)
MCV: 94.9 fL (ref 78.0–100.0)
MONO ABS: 0.5 10*3/uL (ref 0.1–1.0)
MONO ABS: 1 10*3/uL (ref 0.1–1.0)
Monocytes Relative: 3 % (ref 3–12)
Monocytes Relative: 5 % (ref 3–12)
NEUTROS PCT: 91 % — AB (ref 43–77)
Neutro Abs: 14.5 10*3/uL — ABNORMAL HIGH (ref 1.7–7.7)
Neutro Abs: 20.1 10*3/uL — ABNORMAL HIGH (ref 1.7–7.7)
Neutrophils Relative %: 89 % — ABNORMAL HIGH (ref 43–77)
Platelets: 194 10*3/uL (ref 150–400)
Platelets: 210 10*3/uL (ref 150–400)
RBC: 4.45 MIL/uL (ref 3.87–5.11)
RBC: 4.48 MIL/uL (ref 3.87–5.11)
RDW: 13.5 % (ref 11.5–15.5)
RDW: 13.4 % (ref 11.5–15.5)
WBC: 16.4 10*3/uL — ABNORMAL HIGH (ref 4.0–10.5)
WBC: 22.1 10*3/uL — AB (ref 4.0–10.5)

## 2014-01-06 LAB — TROPONIN I
Troponin I: 0.8 ng/mL (ref <0.30)
Troponin I: 2.29 ng/mL (ref <0.30)

## 2014-01-06 LAB — BASIC METABOLIC PANEL
BUN: 15 mg/dL (ref 6–23)
CO2: 30 meq/L (ref 19–32)
CREATININE: 0.68 mg/dL (ref 0.50–1.10)
Calcium: 11.5 mg/dL — ABNORMAL HIGH (ref 8.4–10.5)
Chloride: 93 mEq/L — ABNORMAL LOW (ref 96–112)
GFR calc Af Amer: >90 mL/min (ref >90)
GFR calc non Af Amer: >90 mL/min (ref >90)
Glucose, Bld: 317 mg/dL — ABNORMAL HIGH (ref 70–99)
Potassium: 4.6 mEq/L (ref 3.7–5.3)
Sodium: 137 mEq/L (ref 137–147)

## 2014-01-06 LAB — GLUCOSE, CAPILLARY
GLUCOSE-CAPILLARY: 258 mg/dL — AB (ref 70–99)
Glucose-Capillary: 205 mg/dL — ABNORMAL HIGH (ref 70–99)

## 2014-01-06 LAB — URINE MICROSCOPIC-ADD ON

## 2014-01-06 LAB — DIGOXIN LEVEL
Digoxin Level: 1.7 ng/mL (ref 0.8–2.0)
Digoxin Level: 1.9 ng/mL (ref 0.8–2.0)

## 2014-01-06 LAB — PROTIME-INR
INR: 0.98 (ref 0.00–1.49)
INR: 0.99 (ref 0.00–1.49)
Prothrombin Time: 12.8 seconds (ref 11.6–15.2)
Prothrombin Time: 12.9 seconds (ref 11.6–15.2)

## 2014-01-06 LAB — APTT
aPTT: 31 seconds (ref 24–37)
aPTT: 39 seconds — ABNORMAL HIGH (ref 24–37)

## 2014-01-06 LAB — PRO B NATRIURETIC PEPTIDE: Pro B Natriuretic peptide (BNP): 8105 pg/mL — ABNORMAL HIGH (ref 0–125)

## 2014-01-06 LAB — MRSA PCR SCREENING: MRSA by PCR: POSITIVE — AB

## 2014-01-06 LAB — MAGNESIUM: Magnesium: 2.1 mg/dL (ref 1.5–2.5)

## 2014-01-06 LAB — D-DIMER, QUANTITATIVE (NOT AT ARMC): D DIMER QUANT: 0.53 ug{FEU}/mL — AB (ref 0.00–0.48)

## 2014-01-06 LAB — HEPARIN LEVEL (UNFRACTIONATED): Heparin Unfractionated: 0.13 IU/mL — ABNORMAL LOW (ref 0.30–0.70)

## 2014-01-06 MED ORDER — SODIUM CHLORIDE 0.9 % IV SOLN
250.0000 mL | INTRAVENOUS | Status: DC | PRN
  Administered 2014-01-07: 20:00:00 via INTRAVENOUS

## 2014-01-06 MED ORDER — ASPIRIN EC 81 MG PO TBEC: 81.0000 mg | DELAYED_RELEASE_TABLET | Freq: Every day | ORAL | Status: DC

## 2014-01-06 MED ORDER — ALBUTEROL SULFATE (2.5 MG/3ML) 0.083% IN NEBU
2.5000 mg | INHALATION_SOLUTION | Freq: Once | RESPIRATORY_TRACT | Status: AC
  Administered 2014-01-06: 2.5 mg via RESPIRATORY_TRACT
  Filled 2014-01-06: qty 3

## 2014-01-06 MED ORDER — INSULIN ASPART 100 UNIT/ML ~~LOC~~ SOLN
0.0000 [IU] | Freq: Every day | SUBCUTANEOUS | Status: DC
  Administered 2014-01-07: 2 [IU] via SUBCUTANEOUS

## 2014-01-06 MED ORDER — LISINOPRIL 2.5 MG PO TABS
2.5000 mg | ORAL_TABLET | Freq: Every day | ORAL | Status: DC
  Administered 2014-01-06 – 2014-01-08 (×3): 2.5 mg via ORAL
  Filled 2014-01-06 (×3): qty 1

## 2014-01-06 MED ORDER — SODIUM CHLORIDE 0.9 % IV SOLN: 1000.0000 mL | Freq: Once | INTRAVENOUS | Status: DC

## 2014-01-06 MED ORDER — HEPARIN BOLUS VIA INFUSION
2000.0000 [IU] | Freq: Once | INTRAVENOUS | Status: AC
  Administered 2014-01-06: 2000 [IU] via INTRAVENOUS
  Filled 2014-01-06: qty 2000

## 2014-01-06 MED ORDER — FUROSEMIDE 10 MG/ML IJ SOLN
INTRAMUSCULAR | Status: AC
  Filled 2014-01-06: qty 4

## 2014-01-06 MED ORDER — OXYCODONE HCL 5 MG PO TABS
2.5000 mg | ORAL_TABLET | Freq: Two times a day (BID) | ORAL | Status: DC | PRN
  Administered 2014-01-07 – 2014-01-09 (×2): 2.5 mg via ORAL
  Filled 2014-01-06 (×3): qty 1

## 2014-01-06 MED ORDER — ONDANSETRON HCL 4 MG PO TABS
4.0000 mg | ORAL_TABLET | Freq: Four times a day (QID) | ORAL | Status: DC | PRN
  Administered 2014-01-08: 4 mg via ORAL
  Filled 2014-01-06: qty 1

## 2014-01-06 MED ORDER — LEVOFLOXACIN 500 MG PO TABS
500.0000 mg | ORAL_TABLET | ORAL | Status: DC
  Administered 2014-01-06 – 2014-01-08 (×3): 500 mg via ORAL
  Filled 2014-01-06 (×4): qty 1

## 2014-01-06 MED ORDER — SODIUM CHLORIDE 0.9 % IV SOLN: 1000.0000 mL | INTRAVENOUS | Status: DC

## 2014-01-06 MED ORDER — ACETAMINOPHEN 325 MG PO TABS: 650.0000 mg | ORAL_TABLET | ORAL | Status: DC | PRN

## 2014-01-06 MED ORDER — IOHEXOL 350 MG/ML SOLN
80.0000 mL | Freq: Once | INTRAVENOUS | Status: AC | PRN
  Administered 2014-01-06: 60 mL via INTRAVENOUS

## 2014-01-06 MED ORDER — MORPHINE SULFATE 4 MG/ML IJ SOLN
4.0000 mg | Freq: Once | INTRAMUSCULAR | Status: AC
  Administered 2014-01-06: 4 mg via INTRAVENOUS
  Filled 2014-01-06: qty 1

## 2014-01-06 MED ORDER — SODIUM CHLORIDE 0.9 % IJ SOLN
3.0000 mL | Freq: Two times a day (BID) | INTRAMUSCULAR | Status: DC
  Administered 2014-01-06 – 2014-01-07 (×2): 3 mL via INTRAVENOUS

## 2014-01-06 MED ORDER — DILTIAZEM HCL 100 MG IV SOLR
5.0000 mg/h | INTRAVENOUS | Status: DC
  Administered 2014-01-06: 5 mg/h via INTRAVENOUS
  Filled 2014-01-06: qty 100

## 2014-01-06 MED ORDER — LEVALBUTEROL HCL 0.63 MG/3ML IN NEBU
0.6300 mg | INHALATION_SOLUTION | Freq: Four times a day (QID) | RESPIRATORY_TRACT | Status: DC | PRN
  Administered 2014-01-06 – 2014-01-08 (×2): 0.63 mg via RESPIRATORY_TRACT
  Filled 2014-01-06 (×2): qty 3

## 2014-01-06 MED ORDER — PREDNISONE 20 MG PO TABS
40.0000 mg | ORAL_TABLET | Freq: Every day | ORAL | Status: DC
  Administered 2014-01-06 – 2014-01-08 (×3): 40 mg via ORAL
  Filled 2014-01-06 (×3): qty 2

## 2014-01-06 MED ORDER — DILTIAZEM LOAD VIA INFUSION
10.0000 mg | Freq: Once | INTRAVENOUS | Status: AC
  Administered 2014-01-06: 10 mg via INTRAVENOUS
  Filled 2014-01-06: qty 10

## 2014-01-06 MED ORDER — FLUOXETINE HCL 20 MG PO CAPS
40.0000 mg | ORAL_CAPSULE | Freq: Every day | ORAL | Status: DC
  Administered 2014-01-06 – 2014-01-15 (×9): 40 mg via ORAL
  Filled 2014-01-06 (×10): qty 2

## 2014-01-06 MED ORDER — HEPARIN (PORCINE) IN NACL 100-0.45 UNIT/ML-% IJ SOLN
950.0000 [IU]/h | INTRAMUSCULAR | Status: DC
  Administered 2014-01-06: 750 [IU]/h via INTRAVENOUS
  Filled 2014-01-06 (×2): qty 250

## 2014-01-06 MED ORDER — ALPRAZOLAM 0.5 MG PO TABS
1.0000 mg | ORAL_TABLET | Freq: Two times a day (BID) | ORAL | Status: DC | PRN
  Administered 2014-01-07: 1 mg via ORAL
  Filled 2014-01-06: qty 2

## 2014-01-06 MED ORDER — BUDESONIDE-FORMOTEROL FUMARATE 160-4.5 MCG/ACT IN AERO
2.0000 | INHALATION_SPRAY | Freq: Two times a day (BID) | RESPIRATORY_TRACT | Status: DC
  Administered 2014-01-06 – 2014-01-15 (×13): 2 via RESPIRATORY_TRACT
  Filled 2014-01-06 (×2): qty 6

## 2014-01-06 MED ORDER — ATORVASTATIN CALCIUM 20 MG PO TABS
20.0000 mg | ORAL_TABLET | Freq: Every day | ORAL | Status: DC
  Administered 2014-01-06 – 2014-01-14 (×8): 20 mg via ORAL
  Filled 2014-01-06 (×11): qty 1

## 2014-01-06 MED ORDER — SODIUM CHLORIDE 0.9 % IJ SOLN: 3.0000 mL | INTRAMUSCULAR | Status: DC | PRN

## 2014-01-06 MED ORDER — DIGOXIN 250 MCG PO TABS
0.2500 mg | ORAL_TABLET | Freq: Every day | ORAL | Status: DC
  Administered 2014-01-06 – 2014-01-07 (×2): 0.25 mg via ORAL
  Filled 2014-01-06 (×2): qty 1

## 2014-01-06 MED ORDER — CANAGLIFLOZIN 100 MG PO TABS
1.0000 | ORAL_TABLET | Freq: Every day | ORAL | Status: DC
  Administered 2014-01-06 – 2014-01-08 (×3): 100 mg via ORAL
  Filled 2014-01-06 (×6): qty 1

## 2014-01-06 MED ORDER — OXYCODONE-ACETAMINOPHEN 5-325 MG PO TABS
1.0000 | ORAL_TABLET | Freq: Once | ORAL | Status: AC
  Administered 2014-01-06: 1 via ORAL
  Filled 2014-01-06: qty 1

## 2014-01-06 MED ORDER — METOPROLOL TARTRATE 25 MG PO TABS
25.0000 mg | ORAL_TABLET | Freq: Two times a day (BID) | ORAL | Status: DC
  Administered 2014-01-06 – 2014-01-08 (×4): 25 mg via ORAL
  Filled 2014-01-06 (×5): qty 1

## 2014-01-06 MED ORDER — FUROSEMIDE 10 MG/ML IJ SOLN
20.0000 mg | Freq: Once | INTRAMUSCULAR | Status: AC
  Administered 2014-01-06: 20 mg via INTRAVENOUS

## 2014-01-06 MED ORDER — ASPIRIN 81 MG PO CHEW
324.0000 mg | CHEWABLE_TABLET | Freq: Once | ORAL | Status: AC
Start: 2014-01-06 — End: 2014-01-06
  Administered 2014-01-06: 324 mg via ORAL
  Filled 2014-01-06: qty 4

## 2014-01-06 MED ORDER — ONDANSETRON HCL 4 MG/2ML IJ SOLN
4.0000 mg | Freq: Four times a day (QID) | INTRAMUSCULAR | Status: DC | PRN
  Administered 2014-01-07: 4 mg via INTRAVENOUS
  Filled 2014-01-06: qty 2

## 2014-01-06 MED ORDER — ONDANSETRON HCL 4 MG/2ML IJ SOLN
4.0000 mg | Freq: Once | INTRAMUSCULAR | Status: AC
  Administered 2014-01-06: 4 mg via INTRAVENOUS
  Filled 2014-01-06: qty 2

## 2014-01-06 MED ORDER — LINAGLIPTIN 5 MG PO TABS
5.0000 mg | ORAL_TABLET | Freq: Every day | ORAL | Status: DC
  Administered 2014-01-06 – 2014-01-08 (×3): 5 mg via ORAL
  Filled 2014-01-06 (×4): qty 1

## 2014-01-06 MED ORDER — HEPARIN BOLUS VIA INFUSION
3000.0000 [IU] | Freq: Once | INTRAVENOUS | Status: AC
  Administered 2014-01-06: 3000 [IU] via INTRAVENOUS

## 2014-01-06 MED ORDER — ASPIRIN EC 81 MG PO TBEC
81.0000 mg | DELAYED_RELEASE_TABLET | Freq: Every day | ORAL | Status: DC
  Administered 2014-01-07: 81 mg via ORAL
  Filled 2014-01-06: qty 1

## 2014-01-06 MED ORDER — OXYCODONE-ACETAMINOPHEN 5-325 MG PO TABS
1.0000 | ORAL_TABLET | Freq: Two times a day (BID) | ORAL | Status: DC | PRN
  Administered 2014-01-07 – 2014-01-09 (×3): 1 via ORAL
  Filled 2014-01-06 (×5): qty 1

## 2014-01-06 MED ORDER — POTASSIUM CHLORIDE ER 10 MEQ PO TBCR
10.0000 meq | EXTENDED_RELEASE_TABLET | Freq: Every day | ORAL | Status: DC
  Administered 2014-01-06 – 2014-01-08 (×3): 10 meq via ORAL
  Filled 2014-01-06 (×4): qty 1

## 2014-01-06 MED ORDER — OXYCODONE-ACETAMINOPHEN 7.5-325 MG PO TABS: 1.0000 | ORAL_TABLET | Freq: Two times a day (BID) | ORAL | Status: DC | PRN

## 2014-01-06 MED ORDER — LEVALBUTEROL TARTRATE 45 MCG/ACT IN AERO: 2.0000 | INHALATION_SPRAY | Freq: Four times a day (QID) | RESPIRATORY_TRACT | Status: DC | PRN

## 2014-01-06 MED ORDER — METOPROLOL TARTRATE 1 MG/ML IV SOLN
INTRAVENOUS | Status: AC
  Administered 2014-01-06: 2.5 mg
  Filled 2014-01-06: qty 5

## 2014-01-06 MED ORDER — LORAZEPAM 2 MG/ML IJ SOLN
0.5000 mg | Freq: Four times a day (QID) | INTRAMUSCULAR | Status: DC | PRN
  Administered 2014-01-06: 0.5 mg via INTRAVENOUS
  Filled 2014-01-06: qty 1

## 2014-01-06 MED ORDER — NITROGLYCERIN IN D5W 200-5 MCG/ML-% IV SOLN
INTRAVENOUS | Status: AC
  Administered 2014-01-06: 50000 ug
  Filled 2014-01-06: qty 250

## 2014-01-06 MED ORDER — INSULIN ASPART 100 UNIT/ML ~~LOC~~ SOLN
0.0000 [IU] | Freq: Three times a day (TID) | SUBCUTANEOUS | Status: DC
  Administered 2014-01-07: 2 [IU] via SUBCUTANEOUS

## 2014-01-06 NOTE — ED Notes (Signed)
Attempted to call report to [facility] 3W. Pt c/o of chest pain 3/10. Receiving RN reported could not take pt due to c/o of active chest pain. Dr. Harmon Dun physician notified and reported would consult carelink and assess if stepdown bed available.
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
## 2014-01-06 NOTE — ED Notes (Signed)
MD at bedside. 

## 2014-01-06 NOTE — ED Notes (Addendum)
Pt heart rate now ranging from 80-115, Pt oxygen 90% on 3liters. Increased oxygen to 4liters and pt o2 saturation remain 90-91%. EDP aware and reported to give pt 80 mg of lasix IV and insert foley prior to transport. After consulting with EDP, repositioned pt. Pt oxygen 94%. EDP aware of new oxygen saturation and reported to d/c verbal order to admin IV lasix and foley insertion.

## 2014-01-06 NOTE — Progress Notes (Signed)
CRITICAL VALUE ALERT  Critical value received:  Trop 2.29 Date of notification:  01/06/14  Time of notification:  2100  Critical value read back:yes  Nurse who received alert:  Deloris Ping   MD notified (1st page):  Dr End  Time of first page:  2100  MD notified (2nd page):  Time of second page:  Responding MD:    Time MD responded:

## 2014-01-06 NOTE — ED Notes (Signed)
CRITICAL VALUE ALERT  Critical value received: trop 0.8  Date of notification:  01/06/14  Time of notification: [id number]  Critical value read back: yes  Nurse who received alert:  J.Sandee Bernath rn  MD notified (1st page):  1135  Time of first page:  1135  MD notified (2nd page):  Time of second page:  Responding MD:  I knapp  Time MD responded:  [id number]

## 2014-01-06 NOTE — ED Provider Notes (Signed)
CSN: [id number]     Arrival date & time 01/06/14  1007 History   First MD Initiated Contact with Patient 01/06/14 1016     Chief Complaint  Patient presents with  . Shortness of Breath     (Consider location/radiation/quality/duration/timing/severity/associated sxs/prior Treatment) HPI Comments: Patient is a 63 year old female patient of Dr.  Kevan Rosebush , who presents to the emergency department with complaint of shortness of breath. Patient states that she has some problems with shortness of breath from time to time because she has a history of COPD. She is not on home oxygen. The patient states that on last evening she began to have some pain primarily in her left chest going into her left arm. This was intermittent she is not sure as to how long the episodes would last. It was associated with some nausea, but no actual vomiting per the patient. This morning the patient noted that she was more short of breath with trying to get around. She continue to have some pain in her chest, but the pain was not as severe as on last evening. His been no loss of consciousness. The patient states that she has had some sweats last night, but is unsure if they were much different than her usual sweating. She's had nausea, but no actual vomiting. It is of note that the patient has a history of diabetes mellitus, atrial fibrillation, chronic obstructive pulmonary disease, diabetes mellitus, congestive heart failure, grade 1 diastolic dysfunction, is and hypertension.  Patient is a 63 y.o. female presenting with shortness of breath. The history is provided by the patient.  Shortness of Breath Associated symptoms: chest pain   Associated symptoms: no abdominal pain, no cough, no neck pain and no wheezing     Past Medical History  Diagnosis Date  . Diabetes mellitus   . Arthritis   . Anxiety   . Chronic pain     Bacl pain, Disc L5-S1- Dr. Joya Salm in [place]  . Hyperlipidemia   . Hypertension   . Tachycardia   .  Bronchial asthma   . Atrial fibrillation   . COPD (chronic obstructive pulmonary disease)   . DDD (degenerative disc disease)     Radicular symptoms  . Shortness of breath   . CHF (congestive heart failure)   . Hx of echocardiogram     Was interpreted by Dr Doylene Canard that showed an Ef in the 50-60% range with grade 1 diastolic dysfunction. she had moderate MR, biatrial enlargement, moderate TR and at that time estimated RV systolic pressure was 43 mm.  . History of stress test 06/2011    Abnormal myocardial perfusion study.   Past Surgical History  Procedure Laterality Date  . Carpal tunnel release      x 2  . Breast cyst removed    . Abdominal hysterectomy      partial  . Tubal ligation    . Tonsillectomy     Family History  Problem Relation Age of Onset  . Diabetes Mother   . Heart disease Mother   . Diabetes Sister   . Hypertension Sister   . Hyperlipidemia Sister   . Diabetes Brother   . Heart disease Brother   . Diabetes Brother   . Heart disease Brother    History  Substance Use Topics  . Smoking status: Former Smoker    Types: Cigarettes  . Smokeless tobacco: Not on file     Comment: quit 6 months ago  . Alcohol Use: No  OB History   Grav Para Term Preterm Abortions TAB SAB Ect Mult Living                 Review of Systems  Constitutional: Negative for activity change.       All ROS Neg except as noted in HPI  HENT: Negative for nosebleeds.   Eyes: Negative for photophobia and discharge.  Respiratory: Positive for shortness of breath. Negative for cough and wheezing.   Cardiovascular: Positive for chest pain and palpitations. Negative for leg swelling.  Gastrointestinal: Negative for abdominal pain and blood in stool.  Genitourinary: Negative for dysuria, frequency and hematuria.  Musculoskeletal: Negative for arthralgias, back pain and neck pain.  Skin: Negative.   Neurological: Negative for dizziness, seizures and speech difficulty.   Psychiatric/Behavioral: Negative for hallucinations and confusion.      Allergies  Adhesive; Latex; and Zocor  Home Medications   Current Outpatient Rx  Name  Route  Sig  Dispense  Refill  . albuterol (PROAIR HFA) 108 (90 BASE) MCG/ACT inhaler   Inhalation   Inhale 2 puffs into the lungs every 6 (six) hours as needed for wheezing or shortness of breath.          . ALPRAZolam (XANAX) 1 MG tablet   Oral   Take 1 tablet (1 mg total) by mouth 2 (two) times daily as needed for anxiety.   60 tablet   0     Do not fill before 01/03/2014   . aspirin EC 81 MG tablet   Oral   Take 81 mg by mouth daily.         Marland Kitchen atorvastatin (LIPITOR) 20 MG tablet   Oral   Take 1 tablet (20 mg total) by mouth daily.   30 tablet   6   . budesonide-formoterol (SYMBICORT) 160-4.5 MCG/ACT inhaler   Inhalation   Inhale 2 puffs into the lungs 2 (two) times daily.   1 Inhaler   12   . digoxin (LANOXIN) 0.25 MG tablet   Oral   Take 1 tablet (0.25 mg total) by mouth daily.   30 tablet   6   . FLUoxetine (PROZAC) 40 MG capsule   Oral   Take 1 capsule (40 mg total) by mouth daily.   30 capsule   3   . furosemide (LASIX) 40 MG tablet   Oral   Take 0.5 tablets (20 mg total) by mouth 2 (two) times daily.   30 tablet   3   . glucose blood (ACCU-CHEK AVIVA PLUS) test strip      Test FSBS twice a day.   100 each   12   . HYDROmorphone (DILAUDID) 4 MG tablet   Oral   Take 1 tablet (4 mg total) by mouth every 6 (six) hours as needed for severe pain.   15 tablet   0   . INVOKANA 100 MG TABS   Oral   Take 1 tablet by mouth daily.         Marland Kitchen lisinopril (PRINIVIL,ZESTRIL) 2.5 MG tablet   Oral   Take 1 tablet by mouth daily.         . metoprolol tartrate (LOPRESSOR) 25 MG tablet   Oral   Take 1 tablet (25 mg total) by mouth 2 (two) times daily. Keep appointment for refills.   60 tablet   9   . ondansetron (ZOFRAN) 4 MG tablet   Oral   Take 1 tablet (4 mg total) by mouth  every 6 (six) hours as needed for nausea.   20 tablet   0   . oxyCODONE-acetaminophen (PERCOCET) 7.5-325 MG per tablet   Oral   Take 1 tablet by mouth 2 (two) times daily as needed for pain.   60 tablet   0     Do no fill before 01/03/2014   . potassium chloride (K-DUR) 10 MEQ tablet   Oral   Take 1 tablet (10 mEq total) by mouth daily. With lasix   30 tablet   3   . sitaGLIPtin (JANUVIA) 100 MG tablet      take 1 tablet by mouth once daily   30 tablet   3   . sodium chloride (OCEAN) 0.65 % nasal spray   Nasal   Place 1 spray into the nose as needed. Congestion         . thiamine 100 MG tablet   Oral   Take 1 tablet (100 mg total) by mouth daily.   30 tablet   0    BP 116/75  Temp(Src) 98.6 F (37 C)  Resp 25  Ht 5\' 7"  (1.702 m)  Wt 142 lb (64.411 kg)  BMI 22.24 kg/m2  SpO2 95% Physical Exam  Nursing note and vitals reviewed. Constitutional: She is oriented to person, place, and time. She appears well-developed and well-nourished.  Non-toxic appearance.  HENT:  Head: Normocephalic.  Right Ear: Tympanic membrane and external ear normal.  Left Ear: Tympanic membrane and external ear normal.  Eyes: EOM and lids are normal. Pupils are equal, round, and reactive to light.  Neck: Normal range of motion. Neck supple. Carotid bruit is not present.  Cardiovascular: Normal pulses.  Exam reveals no friction rub.   Irregularly irregular rate and rhythm.  Pulmonary/Chest: Breath sounds normal. No respiratory distress.  Coarse breath sounds with scattered rhonchi. Lower sternal area tenderness to palpation.   Abdominal: Soft. Bowel sounds are normal. There is no tenderness. There is no guarding.  Musculoskeletal: Normal range of motion.  Lymphadenopathy:       Head (right side): No submandibular adenopathy present.       Head (left side): No submandibular adenopathy present.    She has no cervical adenopathy.  Neurological: She is alert and oriented to person, place,  and time. She has normal strength. No cranial nerve deficit or sensory deficit.  Skin: Skin is warm and dry. She is not diaphoretic.  Psychiatric: She has a normal mood and affect. Her speech is normal.    ED Course  Procedures (including critical care time) Labs Review Labs Reviewed  CBC WITH DIFFERENTIAL  BASIC METABOLIC PANEL  TROPONIN I  DIGOXIN LEVEL   Imaging Review No results found.   EKG Interpretation   Date/Time:  Saturday January 06 2014 10:20:03 EDT Ventricular Rate:  87 PR Interval:    QRS Duration: 92 QT Interval:  318 QTC Calculation: 382 R Axis:   6 Text Interpretation:  Atrial fibrillation Low voltage QRS Cannot rule out  Anterior infarct , age undetermined Marked ST abnormality, possible  inferior subendocardial injury When compared with ECG of 21-Dec-2013  17:46, Significant changes have occurred Since last tracing ST now  depressed in Inferolateral leads Confirmed by KNAPP  MD-I, IVA ([id number]) on  01/06/2014 10:32:46 AM      MDM Patient placed on oxygen at 2 L per minute by nasal cannula. The electrocardiogram was obtained and compared to a February 2015 electrocardiogram. There were some ST depression changes in the inferior lateral  leads. Patient continues to be in atrial fibrillation at a controlled rate.  Complete blood count re returned showing elevated white blood cell count of 16,400, hemoglobin and hematocrit were in normal limits, neutrophils were elevated at 89. The basic metabolic panel returned showing the chloride to be low at 93, glucose elevated at 317, potassium elevated at 11.5, otherwise within normal limits. Chest x-ray negative for any acute abnormality. The troponin returned elevated at 0.80. Patient was seen by Dr. Tomi Bamberger. Patient's care continued by Dr. Tomi Bamberger.    Final diagnoses:  None    **I have reviewed nursing notes, vital signs, and all appropriate lab and imaging results for this patient.Lenox Ahr, PA-C 01/06/14  1734

## 2014-01-06 NOTE — ED Notes (Signed)
Pt states her chest was hurting last night. Complain of being SOB today. States she has COPD

## 2014-01-06 NOTE — Progress Notes (Signed)
On-Call Cardiology Note  S: I was contacted earlier this evening by the patient's RN regarding increased agitation and dyspnea, as well as hypoxia.  The patient reports feeling more short of breath but otherwise does not offer any specific complaints.  O: Temp:  [96 F (35.6 C)-98.6 F (37 C)] 96 F (35.6 C) (03/14 1942) Pulse Rate:  [41-127] 93 (03/14 1942) Resp:  [20-28] 22 (03/14 1942) BP: (100-131)/(68-106) 100/68 mmHg (03/14 1900) SpO2:  [92 %-100 %] 93 % (03/14 1952) FiO2 (%):  [55 %] 55 % (03/14 1952) Weight:  [61.4 kg (135 lb 5.8 oz)-64.411 kg (142 lb)] 61.4 kg (135 lb 5.8 oz) (03/14 1515)  Gen: Elderly woman lying in bed with NRB mask on. CV: Tachy and irregular without murmurs.  Unable to assess JVP due to accessory muscle use for breathing. Resp: Increased WOB.  Poor air movement with faint expiratory wheezes.  CTA chest: No evidence of PE.  Mild pulmonary edema.  Coronary atherosclerosis.  Tn: 0.8  A/P: 63 y/o F admitted with pleuritic chest pain and shortness of breath, found to be in a-fib with RVR.  Patient had worsing respiratory distress and hypoxia, which has improved with 50% FiO2 by venturi mask. - Wean O2 to nasal cannula, as tolerated - Give Lasix 20 mg IV x 1 for likely pulmonary edema in the setting of a-fib with RVR and HFpEF - Initiate prednisone 40 mg daily and levofloxacin 500 mg daily for possible COPD exacerbation, given wheezing, diminished breath sounds, and worsening hypoxia. - Continue diltiazem gtt and heparin gtt for a-fib, as patient is well-rate controlled at this time.  Nelva Bush, MD Cardiology Fellow

## 2014-01-06 NOTE — Progress Notes (Signed)
[place] for Heparin Indication: chest pain/ACS  Allergies  Allergen Reactions  . Adhesive [Tape] Other (See Comments)    Tears skin off.   . Latex Other (See Comments)    In tape, tears skin off.  . Zocor [Simvastatin - High Dose] Nausea And Vomiting    Patient Measurements: Height: 5\' 7"  (170.2 cm) Weight: 135 lb 5.8 oz (61.4 kg) IBW/kg (Calculated) : 61.6 Heparin Dosing Weight: 64.4 kg  Vital Signs: Temp: 96 F (35.6 C) (03/14 1942) Temp src: Oral (03/14 1942) BP: 100/68 mmHg (03/14 1900) Pulse Rate: 93 (03/14 1942)  Labs:  Recent Labs  01/06/14 1025 01/06/14 1039 01/06/14 1955  HGB  --  13.5 14.1  HCT  --  42.8 42.5  PLT  --  194 210  APTT 31  --  39*  LABPROT 12.8  --  12.9  INR 0.98  --  0.99  HEPARINUNFRC  --   --  0.13*  CREATININE  --  0.68 0.67  TROPONINI  --  0.80* 2.29*    Estimated Creatinine Clearance: 68.9 ml/min (by C-G formula based on Cr of 0.67).   Assessment: 63yo female being treated for chest pain and rule out PE, transferred to [facility] for further work up. Baseline labs within normal limits. Initial heparin level low, will adjust.  Goal of Therapy:  Heparin level 0.3-0.7 units/ml Monitor platelets by anticoagulation protocol: Yes   Plan:  Rebolus heparin 2000 units Increase IV heparin gtt to 950 units/hr Recheck heparin level in am  Erin Hearing PharmD., BCPS Clinical Pharmacist Pager [phone] 01/06/2014 9:23 PM
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
## 2014-01-06 NOTE — ED Notes (Signed)
Pt friend at bedside had to leave and reported wanted to know what room pt going to. Pt give verbal consent to have friend updated on careplan. Pt friend, dorothy simpson, contacted at [phone].Recieving RN at [facility] given pt friends' contact information as well.

## 2014-01-06 NOTE — Progress Notes (Signed)
ANTICOAGULATION CONSULT NOTE - Initial Consult  Pharmacy Consult for Heparin Indication: chest pain/ACS  Allergies  Allergen Reactions  . Adhesive [Tape] Other (See Comments)    Tears skin off.   . Latex Other (See Comments)    In tape, tears skin off.  . Zocor [Simvastatin - High Dose] Nausea And Vomiting    Patient Measurements: Height: 5\' 7"  (170.2 cm) Weight: 142 lb (64.411 kg) IBW/kg (Calculated) : 61.6 Heparin Dosing Weight: 64.4 kg  Vital Signs: Temp: 98.6 F (37 C) (03/14 1025) BP: 128/84 mmHg (03/14 1100) Pulse Rate: 73 (03/14 1115)  Labs:  Recent Labs  01/06/14 1039  HGB 13.5  HCT 42.8  PLT 194  CREATININE 0.68  TROPONINI 0.80*    Estimated Creatinine Clearance: 69.1 ml/min (by C-G formula based on Cr of 0.68).   Medical History: Past Medical History  Diagnosis Date  . Diabetes mellitus   . Arthritis   . Anxiety   . Chronic pain     Bacl pain, Disc L5-S1- Dr. Joya Salm in [place]  . Hyperlipidemia   . Hypertension   . Tachycardia   . Bronchial asthma   . Atrial fibrillation   . COPD (chronic obstructive pulmonary disease)   . DDD (degenerative disc disease)     Radicular symptoms  . Shortness of breath   . CHF (congestive heart failure)   . Hx of echocardiogram     Was interpreted by Dr Doylene Canard that showed an Ef in the 50-60% range with grade 1 diastolic dysfunction. she had moderate MR, biatrial enlargement, moderate TR and at that time estimated RV systolic pressure was 43 mm.  . History of stress test 06/2011    Abnormal myocardial perfusion study.    Medications:   (Not in a hospital admission) Home Meds Reviewed  Assessment: Okay for Protocol, 63yo female being treated for chest pain.  Baseline anticoagulation labs pending.  Goal of Therapy:  Heparin level 0.3-0.7 units/ml Monitor platelets by anticoagulation protocol: Yes   Plan:  Give 3000 units bolus x 1 Start heparin infusion at 750 units/hr Check anti-Xa level in 6-8 hours  and daily while on heparin Continue to monitor H&H and platelets  Pricilla Larsson 01/06/2014,11:52 AM

## 2014-01-07 ENCOUNTER — Encounter (HOSPITAL_COMMUNITY)
Admission: EM | Disposition: A | Discharge status: Home Health | Payer: Self-pay | Source: Home / Self Care | Attending: Cardiothoracic Surgery

## 2014-01-07 DIAGNOSIS — J449 Chronic obstructive pulmonary disease, unspecified: Secondary | ICD-10-CM

## 2014-01-07 DIAGNOSIS — R0602 Shortness of breath: Secondary | ICD-10-CM

## 2014-01-07 DIAGNOSIS — I2510 Atherosclerotic heart disease of native coronary artery without angina pectoris: Secondary | ICD-10-CM

## 2014-01-07 DIAGNOSIS — R079 Chest pain, unspecified: Secondary | ICD-10-CM

## 2014-01-07 HISTORY — PX: LEFT HEART CATHETERIZATION WITH CORONARY ANGIOGRAM: SHX5451

## 2014-01-07 LAB — URINALYSIS, ROUTINE W REFLEX MICROSCOPIC
Bilirubin Urine: NEGATIVE
Glucose, UA: >1000 mg/dL — AB
Hgb urine dipstick: NEGATIVE
Ketones, ur: 15 mg/dL — AB
Leukocytes, UA: NEGATIVE
Nitrite: NEGATIVE
Protein, ur: NEGATIVE mg/dL
Specific Gravity, Urine: >1.046 — ABNORMAL HIGH (ref 1.005–1.030)
Urobilinogen, UA: 0.2 mg/dL (ref 0.0–1.0)
pH: 6 (ref 5.0–8.0)

## 2014-01-07 LAB — CBC
HEMATOCRIT: 41.1 % (ref 36.0–46.0)
Hemoglobin: 13.6 g/dL (ref 12.0–15.0)
MCH: 31.3 pg (ref 26.0–34.0)
MCHC: 33.1 g/dL (ref 30.0–36.0)
MCV: 94.7 fL (ref 78.0–100.0)
Platelets: 195 10*3/uL (ref 150–400)
RBC: 4.34 MIL/uL (ref 3.87–5.11)
RDW: 13.7 % (ref 11.5–15.5)
WBC: 17.9 10*3/uL — ABNORMAL HIGH (ref 4.0–10.5)

## 2014-01-07 LAB — CK TOTAL AND CKMB (NOT AT ARMC)
CK, MB: 68.6 ng/mL (ref 0.3–4.0)
Relative Index: 18.1 — ABNORMAL HIGH (ref 0.0–2.5)
Total CK: 380 U/L — ABNORMAL HIGH (ref 7–177)

## 2014-01-07 LAB — POCT I-STAT 3, ART BLOOD GAS (G3+)
ACID-BASE DEFICIT: 1 mmol/L (ref 0.0–2.0)
Bicarbonate: 23.8 mEq/L (ref 20.0–24.0)
O2 SAT: 96 %
PO2 ART: 82 mmHg (ref 80.0–100.0)
Patient temperature: 98.6
TCO2: 25 mmol/L (ref 0–100)
pCO2 arterial: 40 mmHg (ref 35.0–45.0)
pH, Arterial: 7.383 (ref 7.350–7.450)

## 2014-01-07 LAB — BASIC METABOLIC PANEL
BUN: 19 mg/dL (ref 6–23)
CHLORIDE: 95 meq/L — AB (ref 96–112)
CO2: 23 meq/L (ref 19–32)
CREATININE: 0.63 mg/dL (ref 0.50–1.10)
Calcium: 10.7 mg/dL — ABNORMAL HIGH (ref 8.4–10.5)
GFR calc Af Amer: >90 mL/min (ref >90)
GFR calc non Af Amer: >90 mL/min (ref >90)
Glucose, Bld: 135 mg/dL — ABNORMAL HIGH (ref 70–99)
Potassium: 4.9 mEq/L (ref 3.7–5.3)
Sodium: 138 mEq/L (ref 137–147)

## 2014-01-07 LAB — GLUCOSE, CAPILLARY
GLUCOSE-CAPILLARY: 184 mg/dL — AB (ref 70–99)
GLUCOSE-CAPILLARY: 236 mg/dL — AB (ref 70–99)
Glucose-Capillary: 157 mg/dL — ABNORMAL HIGH (ref 70–99)

## 2014-01-07 LAB — TSH: TSH: 0.274 u[IU]/mL — ABNORMAL LOW (ref 0.350–4.500)

## 2014-01-07 LAB — TROPONIN I
TROPONIN I: 7.42 ng/mL — AB (ref <0.30)
Troponin I: 10.9 ng/mL (ref <0.30)

## 2014-01-07 LAB — SURGICAL PCR SCREEN
MRSA, PCR: POSITIVE — AB
Staphylococcus aureus: POSITIVE — AB

## 2014-01-07 LAB — HEPARIN LEVEL (UNFRACTIONATED)
HEPARIN UNFRACTIONATED: 0.67 [IU]/mL (ref 0.30–0.70)
Heparin Unfractionated: 0.6 IU/mL (ref 0.30–0.70)

## 2014-01-07 LAB — T4, FREE: FREE T4: 1.16 ng/dL (ref 0.80–1.80)

## 2014-01-07 LAB — URINE MICROSCOPIC-ADD ON

## 2014-01-07 SURGERY — LEFT HEART CATHETERIZATION WITH CORONARY ANGIOGRAM
Anesthesia: LOCAL

## 2014-01-07 MED ORDER — AMIODARONE HCL 200 MG PO TABS
200.0000 mg | ORAL_TABLET | Freq: Two times a day (BID) | ORAL | Status: DC
  Administered 2014-01-07 – 2014-01-08 (×4): 200 mg via ORAL
  Filled 2014-01-07 (×6): qty 1

## 2014-01-07 MED ORDER — MORPHINE SULFATE 2 MG/ML IJ SOLN
1.0000 mg | INTRAMUSCULAR | Status: DC | PRN
  Administered 2014-01-07 (×3): 1 mg via INTRAVENOUS
  Filled 2014-01-07 (×3): qty 1

## 2014-01-07 MED ORDER — CHLORHEXIDINE GLUCONATE CLOTH 2 % EX PADS
6.0000 | MEDICATED_PAD | Freq: Every day | CUTANEOUS | Status: DC
  Administered 2014-01-07 – 2014-01-08 (×2): 6 via TOPICAL

## 2014-01-07 MED ORDER — PHENYLEPHRINE HCL 10 MG/ML IJ SOLN: 30.0000 ug/min | INTRAVENOUS | Status: DC

## 2014-01-07 MED ORDER — ONDANSETRON HCL 4 MG/2ML IJ SOLN
INTRAMUSCULAR | Status: AC
  Filled 2014-01-07: qty 2

## 2014-01-07 MED ORDER — HEPARIN (PORCINE) IN NACL 100-0.45 UNIT/ML-% IJ SOLN
850.0000 [IU]/h | INTRAMUSCULAR | Status: DC
  Administered 2014-01-07: 750 [IU]/h via INTRAVENOUS
  Filled 2014-01-07: qty 250

## 2014-01-07 MED ORDER — FENTANYL CITRATE 0.05 MG/ML IJ SOLN
INTRAMUSCULAR | Status: AC
  Filled 2014-01-07: qty 2

## 2014-01-07 MED ORDER — PHENYLEPHRINE HCL 10 MG/ML IJ SOLN
INTRAMUSCULAR | Status: AC
  Filled 2014-01-07: qty 1

## 2014-01-07 MED ORDER — FUROSEMIDE 10 MG/ML IJ SOLN
40.0000 mg | Freq: Every day | INTRAMUSCULAR | Status: DC
  Administered 2014-01-07 – 2014-01-08 (×2): 40 mg via INTRAVENOUS
  Filled 2014-01-07 (×2): qty 4

## 2014-01-07 MED ORDER — MUPIROCIN 2 % EX OINT
1.0000 "application " | TOPICAL_OINTMENT | Freq: Two times a day (BID) | CUTANEOUS | Status: DC
  Administered 2014-01-07 – 2014-01-08 (×5): 1 via NASAL
  Filled 2014-01-07 (×2): qty 22

## 2014-01-07 MED ORDER — MIDAZOLAM HCL 2 MG/2ML IJ SOLN
INTRAMUSCULAR | Status: AC
  Filled 2014-01-07: qty 2

## 2014-01-07 MED ORDER — LIDOCAINE HCL (PF) 1 % IJ SOLN
INTRAMUSCULAR | Status: AC
  Filled 2014-01-07: qty 30

## 2014-01-07 MED ORDER — VERAPAMIL HCL 2.5 MG/ML IV SOLN
INTRAVENOUS | Status: AC
  Filled 2014-01-07: qty 2

## 2014-01-07 MED ORDER — HEPARIN SODIUM (PORCINE) 1000 UNIT/ML IJ SOLN
INTRAMUSCULAR | Status: AC
  Filled 2014-01-07: qty 1

## 2014-01-07 MED ORDER — ASPIRIN 81 MG PO CHEW
81.0000 mg | CHEWABLE_TABLET | Freq: Every day | ORAL | Status: DC
  Administered 2014-01-07 – 2014-01-08 (×2): 81 mg via ORAL
  Filled 2014-01-07 (×2): qty 1

## 2014-01-07 MED ORDER — NITROGLYCERIN IN D5W 200-5 MCG/ML-% IV SOLN
INTRAVENOUS | Status: AC
Start: 2014-01-07 — End: 2014-01-07
  Filled 2014-01-07: qty 250

## 2014-01-07 MED ORDER — HEPARIN (PORCINE) IN NACL 2-0.9 UNIT/ML-% IJ SOLN
INTRAMUSCULAR | Status: AC
  Filled 2014-01-07: qty 1000

## 2014-01-07 MED ORDER — ATROPINE SULFATE 0.1 MG/ML IJ SOLN
INTRAMUSCULAR | Status: AC
  Filled 2014-01-07: qty 10

## 2014-01-07 MED ORDER — NITROGLYCERIN 0.2 MG/ML ON CALL CATH LAB
INTRAVENOUS | Status: AC
  Filled 2014-01-07: qty 1

## 2014-01-07 MED ORDER — SODIUM CHLORIDE 0.9 % IV SOLN: INTRAVENOUS | Status: AC

## 2014-01-07 NOTE — ED Provider Notes (Signed)
See prior note   CRITICAL CARE Performed by: Rolland Porter L Total critical care time: 31 min  Critical care time was exclusive of separately billable procedures and treating other patients. Critical care was necessary to treat or prevent imminent or life-threatening deterioration. Critical care was time spent personally by me on the following activities: development of treatment plan with patient and/or surrogate as well as nursing, discussions with consultants, evaluation of patient's response to treatment, examination of patient, obtaining history from patient or surrogate, ordering and performing treatments and interventions, ordering and review of laboratory studies, ordering and review of radiographic studies, pulse oximetry and re-evaluation of patient's condition.   Janice Norrie, MD 01/07/14 [phone]
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
## 2014-01-07 NOTE — Progress Notes (Signed)
SUBJECTIVE:  Still complains of 2/10 CP  OBJECTIVE:   Vitals:   Filed Vitals:   01/07/14 0700 01/07/14 0800 01/07/14 0822 01/07/14 0900  BP: 114/72 91/63  112/68  Pulse: 86   86  Temp:  98 F (36.7 C)    TempSrc:  Oral    Resp: 21   20  Height:      Weight:      SpO2: 100% 100% 100% 100%   I&O's:   Intake/Output Summary (Last 24 hours) at 01/07/14 1007 Last data filed at 01/07/14 0900  Gross per 24 hour  Intake 571.08 ml  Output    550 ml  Net  21.08 ml   TELEMETRY: Reviewed telemetry pt in Atrial fibrillation with HR 70bpm     PHYSICAL EXAM General: Well developed, well nourished, in no acute distress Head: Eyes PERRLA, No xanthomas.   Normal cephalic and atramatic  Lungs:   Scattered wheezes and crackles at bases Heart:   Irregularly irregular S1 S2 Pulses are 2+ & equal. Abdomen: Bowel sounds are positive, abdomen soft and non-tender without masses Extremities:   No clubbing, cyanosis or edema.  DP +1 Neuro: Alert and oriented X 3. Psych:  Good affect, responds appropriately   LABS: Basic Metabolic Panel:  Recent Labs  01/06/14 1955 01/07/14 0325  NA 137 138  K 5.1 4.9  CL 94* 95*  CO2 26 23  GLUCOSE 255* 135*  BUN 18 19  CREATININE 0.67 0.63  CALCIUM 10.9* 10.7*  MG 2.1  --    Liver Function Tests:  Recent Labs  01/06/14 1955  AST 47*  ALT 15  ALKPHOS 83  BILITOT 0.4  PROT 7.5  ALBUMIN 3.7   No results found for this basename: LIPASE, AMYLASE,  in the last 72 hours CBC:  Recent Labs  01/06/14 1039 01/06/14 1955 01/07/14 0325  WBC 16.4* 22.1* 17.9*  NEUTROABS 14.5* 20.1*  --   HGB 13.5 14.1 13.6  HCT 42.8 42.5 41.1  MCV 96.2 94.9 94.7  PLT 194 210 195   Cardiac Enzymes:  Recent Labs  01/06/14 1039 01/06/14 1955 01/07/14 0325  TROPONINI 0.80* 2.29* 7.42*   BNP: No components found with this basename: POCBNP,  D-Dimer:  Recent Labs  01/06/14 1112  DDIMER 0.53*   Hemoglobin A1C: No results found for this  basename: HGBA1C,  in the last 72 hours Fasting Lipid Panel: No results found for this basename: CHOL, HDL, LDLCALC, TRIG, CHOLHDL, LDLDIRECT,  in the last 72 hours Thyroid Function Tests:  Recent Labs  01/06/14 1955  TSH 0.274*   Anemia Panel: No results found for this basename: VITAMINB12, FOLATE, FERRITIN, TIBC, IRON, RETICCTPCT,  in the last 72 hours Coag Panel:   Lab Results  Component Value Date   INR 0.99 01/06/2014   INR 0.98 01/06/2014   INR 1.38 09/10/2012    RADIOLOGY: Ct Angio Chest Pe W/cm &/or Wo Cm  01/06/2014   CLINICAL DATA:  Sharp chest pain and shortness of breath. Diabetes. Hypertension. Atrial fibrillation. COPD. CHF. Hysterectomy.  EXAM: CT ANGIOGRAPHY CHEST WITH CONTRAST  TECHNIQUE: Multidetector CT imaging of the chest was performed using the standard protocol during bolus administration of intravenous contrast. Multiplanar CT image reconstructions and MIPs were obtained to evaluate the vascular anatomy.  CONTRAST:  50mL OMNIPAQUE IOHEXOL 350 MG/ML SOLN  COMPARISON:  DG CHEST 1V PORT dated 01/06/2014; CT ANGIO CHEST W/CM &/OR WO/CM dated 12/21/2013  FINDINGS: Lungs/Pleura: Lower lobe predominant bronchial wall thickening. New septal thickening,  most apparent at the apices. Scattered foci of ground-glass opacity.  Dependent bibasilar atelectasis. Scattered pulmonary nodules which were detailed on the prior study and stable back to 2008, consistent with a benign etiology. No lobar consolidation.  Trace bilateral pleural effusions.  Heart/Mediastinum: The quality of this examination for evaluation of pulmonary embolism is good to excellent. No evidence of pulmonary embolism. Subcentimeter partially calcified thyroid nodule, nonspecific.  Aortic and branch vessel atherosclerosis. Aorta poorly opacified, so dissection cannot be excluded/evaluated.  Cardiomegaly with advanced coronary artery atherosclerosis. No mediastinal or hilar adenopathy.  Upper Abdomen:  Reflux of contrast  into the IVC and hepatic veins.  Bones/Musculoskeletal:  Remote anterior left rib fractures.  Review of the MIP images confirms the above findings.  IMPRESSION: 1.  No evidence of pulmonary embolism. 2. Findings of congestive heart failure, mild. 3. Benign pulmonary nodules, similar back to 2008.   Electronically Signed   By: Abigail Miyamoto M.D.   On: 01/06/2014 19:12   Ct Angio Chest Pe W/cm &/or Wo Cm  12/21/2013   CLINICAL DATA:  Chest pain and shortness of breath  EXAM: CT ANGIOGRAPHY CHEST WITH CONTRAST  TECHNIQUE: Multidetector CT imaging of the chest was performed using the standard protocol during bolus administration of intravenous contrast. Multiplanar CT image reconstructions and MIPs were obtained to evaluate the vascular anatomy.  CONTRAST:  149mL OMNIPAQUE IOHEXOL 350 MG/ML SOLN  COMPARISON:  10/25/2013  FINDINGS: Pulmonary arteries are well visualized. No filling defect or significant pulmonary embolus demonstrated by CTA. Atherosclerotic changes of the thoracic aorta the. Negative for aneurysm or dissection. Normal heart size. No pericardial pleural effusion.  No significant adenopathy. Stable prominent mediastinal lymph nodes without definite enlargement. No supraclavicular or axillary adenopathy.  Included upper abdomen demonstrates no acute process.  Lung windows demonstrate apical scarring. No collapse, consolidation, pneumonia, acute airspace process, interstitial change, or edema. Trachea pain central airways are patent. Stable scattered bilateral pulmonary nodules, largest in the right lower lobe posteriorly measures 7 mm. These are stable dating back to 2008 compatible with benign nodules.  Review of the MIP images confirms the above findings.  IMPRESSION: Negative for significant acute pulmonary embolus by CTA.  Atherosclerotic changes of the aorta without aneurysm  No acute intra thoracic finding  Stable bilateral pulmonary nodules dating back to 2008, benign   Electronically Signed   By:  Daryll Brod M.D.   On: 12/21/2013 20:05   Dg Chest Portable 1 View  01/06/2014   CLINICAL DATA:  Shortness of breath and chest pain  EXAM: PORTABLE CHEST - 1 VIEW  COMPARISON:  12/21/2013  FINDINGS: The cardiac shadow is stable. Very mild interstitial changes are seen but stable from the prior exam. No focal infiltrate is noted. No acute bony abnormality is seen.  IMPRESSION: No acute abnormality noted.   Electronically Signed   By: Inez Catalina M.D.   On: 01/06/2014 10:58   Dg Chest Portable 1 View  12/21/2013   CLINICAL DATA:  Chest pain and shortness breath.  EXAM: PORTABLE CHEST - 1 VIEW  COMPARISON:  CT ANGIO CHEST W/CM &/OR WO/CM dated 10/25/2013; DG CHEST 2 VIEW dated 10/25/2013  FINDINGS: The heart size and mediastinal contours are within normal limits. Both lungs are clear. The visualized skeletal structures are unremarkable. No appreciable change from prior.  IMPRESSION: No active disease.   Electronically Signed   By: Rolla Flatten M.D.   On: 12/21/2013 18:19    ASSESSMENT:  1. Atrial fibrillation with RVR now rate  controlled. Unclear whether this is chronic for her.  She says that she had been on anticoagulation in the past and it was stopped and she has an appt to see Dr. Claiborne Billings to discuss restarting NOAC. EKG in February shows atrial fibrillation with CVR and nonspecific ST abnormality. Dig level is 1.9.   2. Acute SOB with normal chest xray and elevated BNP. She has chronic SOB which became acutely worse last night in setting of sudden onset of sharp CP.  Chest CT showed no evidence of PE.   BNP is elevated with chest xray showing mild CHF. 3. Atypical chest pain which is pleuritic with minimal elevation in troponin that can be explained by rapid afib and diastolic CHF and probably COPD exacerbation. 4. Acute on chronic diastolic CHF  5. Bronchial asthma and COPD - Probable COPD exacerbation with elevated WBC.  6. DM  7. HTN - controlled 8. Leukocytosis of ? etiology  9. NSTEMI -  troponin increased from 0.8 on admit now at 7.42 and has not peaked.  Of note Chest CT showed advanced coronary artery calcifications throughout.  PLAN:  1.  Discussed with Dr. Gwenlyn Found.  Her troponin has increased more than would be expected for type II demand ischemia from rapid afib and COPD.  She has advanced coronary artery calcifications on chest CT and ischemic ST changes in the lateral precordial leads which are new from EKG 10/25/2013.  Will plan to take to cath lab now since she continues to have low grade CP.  2.  IV Heparin gtt/ASA/statin/beta blocker/ACE I 3.  Cardiac catheterization was discussed with the patient fully including risks on myocardial infarction, death, stroke, bleeding, arrhythmia, dye allergy, renal insufficiency or bleeding.  All patient questions and concerns were discussed and the patient understands and is willing to proceed.   4.  2D echo to assess LVF down 5.  Continue antibiotics and steroids 6.  Hold digoxin secondary to high level 7.  Lasix 40mg  IV daily - start after cath     Sueanne Margarita, MD  01/07/2014  10:07 AM

## 2014-01-07 NOTE — Progress Notes (Signed)
ANTICOAGULATION CONSULT NOTE - Follow Up Consult  Pharmacy Consult for heparin Indication: chest pain/ACS and atrial fibrillation  Labs:  Recent Labs  01/06/14 1025  01/06/14 1039 01/06/14 1955 01/07/14 0325 01/07/14 0945 01/07/14 0948  HGB  --   < > 13.5 14.1 13.6  --   --   HCT  --   --  42.8 42.5 41.1  --   --   PLT  --   --  194 210 195  --   --   APTT 31  --   --  39*  --   --   --   LABPROT 12.8  --   --  12.9  --   --   --   INR 0.98  --   --  0.99  --   --   --   HEPARINUNFRC  --   --   --  0.13* 0.67 0.60  --   CREATININE  --   --  0.68 0.67 0.63  --   --   TROPONINI  --   < > 0.80* 2.29* 7.42*  --  10.90*  < > = values in this interval not displayed.   Assessment:  63 yo female transferred from [facility] to [facility] for chest pain and rule out PE. Patient also has atrial fibrillation. Pharmacy has been consulted to dose heparin for chest pain/ACS/atrial fibrillation. Heparin level has been therapeutic x 2 with last level of 0.60. Hg 13.6, platelets 195. No s/s of bleeding noted.   Goal of Therapy:  Heparin level 0.3-0.7 units/ml  Monitor platelets by anticoagulation protocol: Yes  Plan:  - Continue heparin IV 950 units/hr - Monitor daily CBC, heparin level, and s/s bleeding  Roderic Palau A. Pincus Badder, PharmD Clinical Pharmacist - Resident Pager: [phone] Pharmacy: [phone] 01/07/2014 11:36 AM

## 2014-01-07 NOTE — Interval H&P Note (Signed)
Cath Lab Visit (complete for each Cath Lab visit)  Clinical Evaluation Leading to the Procedure:   ACS: yes  Non-ACS:    Anginal Classification: CCS IV  Anti-ischemic medical therapy: Maximal Therapy (2 or more classes of medications)  Non-Invasive Test Results: No non-invasive testing performed  Prior CABG: No previous CABG      History and Physical Interval Note:  01/07/2014 11:30 AM  Mary [name]  has presented today for surgery, with the diagnosis of non-stemi  The various methods of treatment have been discussed with the patient and family. After consideration of risks, benefits and other options for treatment, the patient has consented to  Procedure(s): LEFT HEART CATHETERIZATION WITH CORONARY ANGIOGRAM (N/A) as a surgical intervention .  The patient's history has been reviewed, patient examined, no change in status, stable for surgery.  I have reviewed the patient's chart and labs.  Questions were answered to the patient's satisfaction.     Lorretta Harp

## 2014-01-07 NOTE — H&P (View-Only) (Signed)
SUBJECTIVE:  Still complains of 2/10 CP  OBJECTIVE:   Vitals:   Filed Vitals:   01/07/14 0700 01/07/14 0800 01/07/14 0822 01/07/14 0900  BP: 114/72 91/63  112/68  Pulse: 86   86  Temp:  98 F (36.7 C)    TempSrc:  Oral    Resp: 21   20  Height:      Weight:      SpO2: 100% 100% 100% 100%   I&O's:   Intake/Output Summary (Last 24 hours) at 01/07/14 1007 Last data filed at 01/07/14 0900  Gross per 24 hour  Intake 571.08 ml  Output    550 ml  Net  21.08 ml   TELEMETRY: Reviewed telemetry pt in Atrial fibrillation with HR 70bpm     PHYSICAL EXAM General: Well developed, well nourished, in no acute distress Head: Eyes PERRLA, No xanthomas.   Normal cephalic and atramatic  Lungs:   Scattered wheezes and crackles at bases Heart:   Irregularly irregular S1 S2 Pulses are 2+ & equal. Abdomen: Bowel sounds are positive, abdomen soft and non-tender without masses Extremities:   No clubbing, cyanosis or edema.  DP +1 Neuro: Alert and oriented X 3. Psych:  Good affect, responds appropriately   LABS: Basic Metabolic Panel:  Recent Labs  01/06/14 1955 01/07/14 0325  NA 137 138  K 5.1 4.9  CL 94* 95*  CO2 26 23  GLUCOSE 255* 135*  BUN 18 19  CREATININE 0.67 0.63  CALCIUM 10.9* 10.7*  MG 2.1  --    Liver Function Tests:  Recent Labs  01/06/14 1955  AST 47*  ALT 15  ALKPHOS 83  BILITOT 0.4  PROT 7.5  ALBUMIN 3.7   No results found for this basename: LIPASE, AMYLASE,  in the last 72 hours CBC:  Recent Labs  01/06/14 1039 01/06/14 1955 01/07/14 0325  WBC 16.4* 22.1* 17.9*  NEUTROABS 14.5* 20.1*  --   HGB 13.5 14.1 13.6  HCT 42.8 42.5 41.1  MCV 96.2 94.9 94.7  PLT 194 210 195   Cardiac Enzymes:  Recent Labs  01/06/14 1039 01/06/14 1955 01/07/14 0325  TROPONINI 0.80* 2.29* 7.42*   BNP: No components found with this basename: POCBNP,  D-Dimer:  Recent Labs  01/06/14 1112  DDIMER 0.53*   Hemoglobin A1C: No results found for this  basename: HGBA1C,  in the last 72 hours Fasting Lipid Panel: No results found for this basename: CHOL, HDL, LDLCALC, TRIG, CHOLHDL, LDLDIRECT,  in the last 72 hours Thyroid Function Tests:  Recent Labs  01/06/14 1955  TSH 0.274*   Anemia Panel: No results found for this basename: VITAMINB12, FOLATE, FERRITIN, TIBC, IRON, RETICCTPCT,  in the last 72 hours Coag Panel:   Lab Results  Component Value Date   INR 0.99 01/06/2014   INR 0.98 01/06/2014   INR 1.38 09/10/2012    RADIOLOGY: Ct Angio Chest Pe W/cm &/or Wo Cm  01/06/2014   CLINICAL DATA:  Sharp chest pain and shortness of breath. Diabetes. Hypertension. Atrial fibrillation. COPD. CHF. Hysterectomy.  EXAM: CT ANGIOGRAPHY CHEST WITH CONTRAST  TECHNIQUE: Multidetector CT imaging of the chest was performed using the standard protocol during bolus administration of intravenous contrast. Multiplanar CT image reconstructions and MIPs were obtained to evaluate the vascular anatomy.  CONTRAST:  9mL OMNIPAQUE IOHEXOL 350 MG/ML SOLN  COMPARISON:  DG CHEST 1V PORT dated 01/06/2014; CT ANGIO CHEST W/CM &/OR WO/CM dated 12/21/2013  FINDINGS: Lungs/Pleura: Lower lobe predominant bronchial wall thickening. New septal thickening,  most apparent at the apices. Scattered foci of ground-glass opacity.  Dependent bibasilar atelectasis. Scattered pulmonary nodules which were detailed on the prior study and stable back to 2008, consistent with a benign etiology. No lobar consolidation.  Trace bilateral pleural effusions.  Heart/Mediastinum: The quality of this examination for evaluation of pulmonary embolism is good to excellent. No evidence of pulmonary embolism. Subcentimeter partially calcified thyroid nodule, nonspecific.  Aortic and branch vessel atherosclerosis. Aorta poorly opacified, so dissection cannot be excluded/evaluated.  Cardiomegaly with advanced coronary artery atherosclerosis. No mediastinal or hilar adenopathy.  Upper Abdomen:  Reflux of contrast  into the IVC and hepatic veins.  Bones/Musculoskeletal:  Remote anterior left rib fractures.  Review of the MIP images confirms the above findings.  IMPRESSION: 1.  No evidence of pulmonary embolism. 2. Findings of congestive heart failure, mild. 3. Benign pulmonary nodules, similar back to 2008.   Electronically Signed   By: Abigail Miyamoto M.D.   On: 01/06/2014 19:12   Ct Angio Chest Pe W/cm &/or Wo Cm  12/21/2013   CLINICAL DATA:  Chest pain and shortness of breath  EXAM: CT ANGIOGRAPHY CHEST WITH CONTRAST  TECHNIQUE: Multidetector CT imaging of the chest was performed using the standard protocol during bolus administration of intravenous contrast. Multiplanar CT image reconstructions and MIPs were obtained to evaluate the vascular anatomy.  CONTRAST:  112mL OMNIPAQUE IOHEXOL 350 MG/ML SOLN  COMPARISON:  10/25/2013  FINDINGS: Pulmonary arteries are well visualized. No filling defect or significant pulmonary embolus demonstrated by CTA. Atherosclerotic changes of the thoracic aorta the. Negative for aneurysm or dissection. Normal heart size. No pericardial pleural effusion.  No significant adenopathy. Stable prominent mediastinal lymph nodes without definite enlargement. No supraclavicular or axillary adenopathy.  Included upper abdomen demonstrates no acute process.  Lung windows demonstrate apical scarring. No collapse, consolidation, pneumonia, acute airspace process, interstitial change, or edema. Trachea pain central airways are patent. Stable scattered bilateral pulmonary nodules, largest in the right lower lobe posteriorly measures 7 mm. These are stable dating back to 2008 compatible with benign nodules.  Review of the MIP images confirms the above findings.  IMPRESSION: Negative for significant acute pulmonary embolus by CTA.  Atherosclerotic changes of the aorta without aneurysm  No acute intra thoracic finding  Stable bilateral pulmonary nodules dating back to 2008, benign   Electronically Signed   By:  Daryll Brod M.D.   On: 12/21/2013 20:05   Dg Chest Portable 1 View  01/06/2014   CLINICAL DATA:  Shortness of breath and chest pain  EXAM: PORTABLE CHEST - 1 VIEW  COMPARISON:  12/21/2013  FINDINGS: The cardiac shadow is stable. Very mild interstitial changes are seen but stable from the prior exam. No focal infiltrate is noted. No acute bony abnormality is seen.  IMPRESSION: No acute abnormality noted.   Electronically Signed   By: Inez Catalina M.D.   On: 01/06/2014 10:58   Dg Chest Portable 1 View  12/21/2013   CLINICAL DATA:  Chest pain and shortness breath.  EXAM: PORTABLE CHEST - 1 VIEW  COMPARISON:  CT ANGIO CHEST W/CM &/OR WO/CM dated 10/25/2013; DG CHEST 2 VIEW dated 10/25/2013  FINDINGS: The heart size and mediastinal contours are within normal limits. Both lungs are clear. The visualized skeletal structures are unremarkable. No appreciable change from prior.  IMPRESSION: No active disease.   Electronically Signed   By: Rolla Flatten M.D.   On: 12/21/2013 18:19    ASSESSMENT:  1. Atrial fibrillation with RVR now rate  controlled. Unclear whether this is chronic for her.  She says that she had been on anticoagulation in the past and it was stopped and she has an appt to see Dr. Claiborne Billings to discuss restarting NOAC. EKG in February shows atrial fibrillation with CVR and nonspecific ST abnormality. Dig level is 1.9.   2. Acute SOB with normal chest xray and elevated BNP. She has chronic SOB which became acutely worse last night in setting of sudden onset of sharp CP.  Chest CT showed no evidence of PE.   BNP is elevated with chest xray showing mild CHF. 3. Atypical chest pain which is pleuritic with minimal elevation in troponin that can be explained by rapid afib and diastolic CHF and probably COPD exacerbation. 4. Acute on chronic diastolic CHF  5. Bronchial asthma and COPD - Probable COPD exacerbation with elevated WBC.  6. DM  7. HTN - controlled 8. Leukocytosis of ? etiology  9. NSTEMI -  troponin increased from 0.8 on admit now at 7.42 and has not peaked.  Of note Chest CT showed advanced coronary artery calcifications throughout.  PLAN:  1.  Discussed with Dr. Gwenlyn Found.  Her troponin has increased more than would be expected for type II demand ischemia from rapid afib and COPD.  She has advanced coronary artery calcifications on chest CT and ischemic ST changes in the lateral precordial leads which are new from EKG 10/25/2013.  Will plan to take to cath lab now since she continues to have low grade CP.  2.  IV Heparin gtt/ASA/statin/beta blocker/ACE I 3.  Cardiac catheterization was discussed with the patient fully including risks on myocardial infarction, death, stroke, bleeding, arrhythmia, dye allergy, renal insufficiency or bleeding.  All patient questions and concerns were discussed and the patient understands and is willing to proceed.   4.  2D echo to assess LVF down 5.  Continue antibiotics and steroids 6.  Hold digoxin secondary to high level 7.  Lasix 40mg  IV daily - start after cath     Sueanne Margarita, MD  01/07/2014  10:07 AM

## 2014-01-07 NOTE — Consult Note (Signed)
[address]       [place] [address]             [phone]        Monasia A Grim [place] Medical Record [id number] Date of Birth: 05-18-1951  Referring: No ref. provider found Primary Care: Vic Blackbird, MD  Chief Complaint:    Chief Complaint  Patient presents with  . Shortness of Breath   patient examined, cardiac catheterization reviewed  History of Present Illness:     63 year old Caucasian female ex-smoker with diabetes hypertension and history of atrial fibrillation for the past few months was admitted via the EEG complaining of pain in her chest and shortness of breath. The pain has been on and off for the past several weeks. CTA showed no evidence of pulmonary emboli. There is minimal pulmonary edema. Cardiac enzymes were positive however and she was transferred to this hospital for further cardiology consultation. She is found to be in rapid atrial fibrillation with nonspecific ST segment elevations and was felt to have had a non-ST elevation MI. She also had elevated white count of 22,000 with leftward shift. She is started on heparin and Cardizem drips. Cardiac catheterization was performed today which demonstrated a 80% left main stenosis and three-vessel CAD with ejection fraction of 30%--because of mild hypotension a balloon pump was placed. She is currently in sinus rhythm and a 2-D echocardiogram is pending. She is currently stable in the CCU without chest pain or shortness of breath but is fairly anxious. She was apparently felt to be having COPD flareup at the outside hospital and was placed on prednisone and Levaquin.   Current Activity/ Functional Status: She lives alone in [place], retired. She has a history of falls in the past as well as chronic pain  Zubrod Score: At the time of surgery this patient's most appropriate activity status/level should be described as: []     0    Normal activity, no symptoms []     1    Restricted in  physical strenuous activity but ambulatory, able to do out light work [x]     2    Ambulatory and capable of self care, unable to do work activities, up and about                 more than 50%  Of the time                            []     3    Only limited self care, in bed greater than 50% of waking hours []     4    Completely disabled, no self care, confined to bed or chair []     5    Moribund  Past Medical History  Diagnosis Date  . Diabetes mellitus   . Arthritis   . Anxiety   . Chronic pain     Bacl pain, Disc L5-S1- Dr. Joya Salm in [place]  . Hyperlipidemia   . Hypertension   . Tachycardia   . Bronchial asthma   . Atrial fibrillation   . COPD (chronic obstructive pulmonary disease)   . DDD (degenerative disc disease)     Radicular symptoms  . Shortness of breath   . CHF (congestive heart failure)   . Hx of echocardiogram     Was interpreted by Dr Doylene Canard that showed an Ef in the 50-60% range with grade 1 diastolic  dysfunction. she had moderate MR, biatrial enlargement, moderate TR and at that time estimated RV systolic pressure was 43 mm.  . History of stress test 06/2011    Abnormal myocardial perfusion study.    Past Surgical History  Procedure Laterality Date  . Carpal tunnel release      x 2  . Breast cyst removed    . Abdominal hysterectomy      partial  . Tubal ligation    . Tonsillectomy      History  Smoking status  . Former Smoker  . Types: Cigarettes  Smokeless tobacco  . Not on file    Comment: quit 6 months ago    History  Alcohol Use No    History   Social History  . Marital Status: Divorced    Spouse Name: N/A    Number of Children: N/A  . Years of Education: N/A   Occupational History  . Not on file.   Social History Main Topics  . Smoking status: Former Smoker    Types: Cigarettes  . Smokeless tobacco: Not on file     Comment: quit 6 months ago  . Alcohol Use: No  . Drug Use: No  . Sexual Activity: Yes    Birth Control/ Protection:  Surgical   Other Topics Concern  . Not on file   Social History Narrative  . No narrative on file    Allergies  Allergen Reactions  . Adhesive [Tape] Other (See Comments)    Tears skin off.   . Latex Other (See Comments)    In tape, tears skin off.  . Zocor [Simvastatin - High Dose] Nausea And Vomiting    Current Facility-Administered Medications  Medication Dose Route Frequency Provider Last Rate Last Dose  . 0.9 %  sodium chloride infusion  250 mL Intravenous PRN Sueanne Margarita, MD      . 0.9 %  sodium chloride infusion   Intravenous Continuous Lorretta Harp, MD 75 mL/hr at 01/07/14 1345    . acetaminophen (TYLENOL) tablet 650 mg  650 mg Oral Q4H PRN Sueanne Margarita, MD      . ALPRAZolam Duanne Moron) tablet 1 mg  1 mg Oral BID PRN Sueanne Margarita, MD      . amiodarone (PACERONE) tablet 200 mg  200 mg Oral BID Ivin Poot, MD      . aspirin chewable tablet 81 mg  81 mg Oral Daily Lorretta Harp, MD      . atorvastatin (LIPITOR) tablet 20 mg  20 mg Oral q1800 Sueanne Margarita, MD   20 mg at 01/06/14 1818  . budesonide-formoterol (SYMBICORT) 160-4.5 MCG/ACT inhaler 2 puff  2 puff Inhalation BID Sueanne Margarita, MD   2 puff at 01/07/14 [id number]  . Canagliflozin TABS 100 mg  1 tablet Oral Daily Sueanne Margarita, MD   100 mg at 01/06/14 2000  . Chlorhexidine Gluconate Cloth 2 % PADS 6 each  6 each Topical Q0600 Troy Sine, MD   6 each at 01/07/14 0600  . FLUoxetine (PROZAC) capsule 40 mg  40 mg Oral Daily Sueanne Margarita, MD   40 mg at 01/07/14 0946  . furosemide (LASIX) injection 40 mg  40 mg Intravenous Daily Sueanne Margarita, MD      . insulin aspart (novoLOG) injection 0-5 Units  0-5 Units Subcutaneous QHS Christopher A. End, MD      . insulin aspart (novoLOG) injection 0-9 Units  0-9 Units Subcutaneous  TID WC Christopher A. End, MD      . levalbuterol (XOPENEX) nebulizer solution 0.63 mg  0.63 mg Nebulization Q6H PRN Sueanne Margarita, MD   0.63 mg at 01/06/14 1743  . levofloxacin  (LEVAQUIN) tablet 500 mg  500 mg Oral Q24H Glena Norfolk End, MD   500 mg at 01/06/14 2238  . linagliptin (TRADJENTA) tablet 5 mg  5 mg Oral Daily Sueanne Margarita, MD   5 mg at 01/07/14 0946  . lisinopril (PRINIVIL,ZESTRIL) tablet 2.5 mg  2.5 mg Oral Daily Sueanne Margarita, MD   2.5 mg at 01/07/14 0946  . LORazepam (ATIVAN) injection 0.5 mg  0.5 mg Intravenous Q6H PRN Glena Norfolk End, MD   0.5 mg at 01/06/14 1818  . metoprolol tartrate (LOPRESSOR) tablet 25 mg  25 mg Oral BID Sueanne Margarita, MD   25 mg at 01/07/14 0946  . morphine 2 MG/ML injection 1 mg  1 mg Intravenous Q1H PRN Lorretta Harp, MD      . mupirocin ointment (BACTROBAN) 2 % 1 application  1 application Nasal BID Troy Sine, MD   1 application at [id number] [phone]  . ondansetron (ZOFRAN) injection 4 mg  4 mg Intravenous Q6H PRN Sueanne Margarita, MD      . ondansetron (ZOFRAN) tablet 4 mg  4 mg Oral Q6H PRN Sueanne Margarita, MD      . oxyCODONE-acetaminophen (PERCOCET/ROXICET) 5-325 MG per tablet 1 tablet  1 tablet Oral BID PRN Sueanne Margarita, MD       And  . oxyCODONE (Oxy IR/ROXICODONE) immediate release tablet 2.5 mg  2.5 mg Oral BID PRN Sueanne Margarita, MD      . potassium chloride (K-DUR) CR tablet 10 mEq  10 mEq Oral Daily Sueanne Margarita, MD   10 mEq at 01/07/14 0946  . predniSONE (DELTASONE) tablet 40 mg  40 mg Oral Daily Glena Norfolk End, MD   40 mg at 01/07/14 0946  . sodium chloride 0.9 % injection 3 mL  3 mL Intravenous PRN Sueanne Margarita, MD        Prescriptions prior to admission  Medication Sig Dispense Refill  . albuterol (PROAIR HFA) 108 (90 BASE) MCG/ACT inhaler Inhale 2 puffs into the lungs every 6 (six) hours as needed for wheezing or shortness of breath.       . ALPRAZolam (XANAX) 1 MG tablet Take 1 tablet (1 mg total) by mouth 2 (two) times daily as needed for anxiety.  60 tablet  0  . aspirin EC 81 MG tablet Take 81 mg by mouth daily.      Marland Kitchen atorvastatin (LIPITOR) 20 MG tablet Take 1 tablet (20 mg total) by  mouth daily.  30 tablet  6  . budesonide-formoterol (SYMBICORT) 160-4.5 MCG/ACT inhaler Inhale 2 puffs into the lungs 2 (two) times daily.  1 Inhaler  12  . digoxin (LANOXIN) 0.25 MG tablet Take 1 tablet (0.25 mg total) by mouth daily.  30 tablet  6  . FLUoxetine (PROZAC) 40 MG capsule Take 1 capsule (40 mg total) by mouth daily.  30 capsule  3  . furosemide (LASIX) 40 MG tablet Take 0.5 tablets (20 mg total) by mouth 2 (two) times daily.  30 tablet  3  . HYDROmorphone (DILAUDID) 4 MG tablet Take 1 tablet (4 mg total) by mouth every 6 (six) hours as needed for severe pain.  15 tablet  0  . INVOKANA 100 MG TABS Take 1  tablet by mouth daily.      Marland Kitchen lisinopril (PRINIVIL,ZESTRIL) 2.5 MG tablet Take 1 tablet by mouth daily.      . metoprolol tartrate (LOPRESSOR) 25 MG tablet Take 1 tablet (25 mg total) by mouth 2 (two) times daily. Keep appointment for refills.  60 tablet  9  . ondansetron (ZOFRAN) 4 MG tablet Take 1 tablet (4 mg total) by mouth every 6 (six) hours as needed for nausea.  20 tablet  0  . oxyCODONE-acetaminophen (PERCOCET) 7.5-325 MG per tablet Take 1 tablet by mouth 2 (two) times daily as needed for pain.  60 tablet  0  . potassium chloride (K-DUR) 10 MEQ tablet Take 1 tablet (10 mEq total) by mouth daily. With lasix  30 tablet  3  . sitaGLIPtin (JANUVIA) 100 MG tablet take 1 tablet by mouth once daily  30 tablet  3  . sodium chloride (OCEAN) 0.65 % nasal spray Place 1 spray into the nose as needed. Congestion      . thiamine 100 MG tablet Take 1 tablet (100 mg total) by mouth daily.  30 tablet  0    Family History  Problem Relation Age of Onset  . Diabetes Mother   . Heart disease Mother   . Diabetes Sister   . Hypertension Sister   . Hyperlipidemia Sister   . Diabetes Brother   . Heart disease Brother   . Diabetes Brother   . Heart disease Brother      Review of Systems:     Cardiac Review of Systems: Y or N  Chest Pain [  Y.  ]  Resting SOB [  Y. ] Exertional SOB  [ Y.  ]  Orthopnea Aqua.Slicker.  ]   Pedal Edema [  and ]    Palpitations [Y. ] Syncope  [  ]   Presyncope [Y.   ]  General Review of Systems: [Y] = yes [  ]=no Constitional: recent weight change [  ]; anorexia [  ]; fatigue [  ]; nausea [  ]; night sweats [  ]; fever [  ]; or chills [  ]                                                               Dental: poor dentition[Y. few remaining teeth  ]; Last Dentist visit: Greater than one year  Eye : blurred vision [  ]; diplopia [   ]; vision changes [  ];  Amaurosis fugax[  ]; Resp: cough [  ];  wheezing[Y.  ];  hemoptysis[  ]; shortness of breath[Y.  ]; paroxysmal nocturnal dyspnea[  ]; dyspnea on exertion[Y.  ]; or orthopnea[  ];  GI:  gallstones[  ], vomiting[  ];  dysphagia[  ]; melena[  ];  hematochezia [  ]; heartburn[  ];   Hx of  Colonoscopy[  ]; GU: kidney stones [  ]; hematuria[  ];   dysuria [  ];  nocturia[  ];  history of     obstruction [  ]; urinary frequency [  ]             Skin: rash, swelling[  ];, hair loss[  ];  peripheral edema[  ];  or itching[  ]; Musculosketetal: myalgias[  ];  joint swelling[  ];  joint erythema[  ];  joint pain[  ];  back pain[Y.  ];  Heme/Lymph: bruising[  ];  bleeding[  ];  anemia[  ];  Neuro: TIA[  ];  headaches[  ];  stroke[  ];  vertigo[  ];  seizures[  ];   paresthesias[  ];  difficulty walking[  ];  Psych:depression[  ]; anxiety[ Y. ];  Endocrine: diabetes[  Y.];  thyroid dysfunction[  ];  Immunizations: Flu [  ]; Pneumococcal[  ];  Other:  Physical Exam: BP 158/82  Pulse 83  Temp(Src) 98 F (36.7 C) (Oral)  Resp 19  Ht 5\' 7"  (1.702 m)  Wt 135 lb 12.9 oz (61.6 kg)  BMI 21.26 kg/m2  SpO2 100%  General appearance-middle-aged Caucasian female with balloon pump in the CCU anxious but in no distress- HEENT-pupils equal normocephalic few remaining teeth in the mandible Neck-no JVD mass or adenopathy Chest-no deformity or tenderness breath sounds clear Cardiac-regular rhythm without gallop or  murmur Abdomen-without pulsatile mass or tenderness Extremities-mild clubbing and no cyanosis edema or tenderness, balloon pump in right femoral  Artery Neuro no focal motor deficit   Diagnostic Studies & Laboratory data:     Recent Radiology Findings:   Ct Angio Chest Pe W/cm &/or Wo Cm  01/06/2014   CLINICAL DATA:  Sharp chest pain and shortness of breath. Diabetes. Hypertension. Atrial fibrillation. COPD. CHF. Hysterectomy.  EXAM: CT ANGIOGRAPHY CHEST WITH CONTRAST  TECHNIQUE: Multidetector CT imaging of the chest was performed using the standard protocol during bolus administration of intravenous contrast. Multiplanar CT image reconstructions and MIPs were obtained to evaluate the vascular anatomy.  CONTRAST:  79mL OMNIPAQUE IOHEXOL 350 MG/ML SOLN  COMPARISON:  DG CHEST 1V PORT dated 01/06/2014; CT ANGIO CHEST W/CM &/OR WO/CM dated 12/21/2013  FINDINGS: Lungs/Pleura: Lower lobe predominant bronchial wall thickening. New septal thickening, most apparent at the apices. Scattered foci of ground-glass opacity.  Dependent bibasilar atelectasis. Scattered pulmonary nodules which were detailed on the prior study and stable back to 2008, consistent with a benign etiology. No lobar consolidation.  Trace bilateral pleural effusions.  Heart/Mediastinum: The quality of this examination for evaluation of pulmonary embolism is good to excellent. No evidence of pulmonary embolism. Subcentimeter partially calcified thyroid nodule, nonspecific.  Aortic and branch vessel atherosclerosis. Aorta poorly opacified, so dissection cannot be excluded/evaluated.  Cardiomegaly with advanced coronary artery atherosclerosis. No mediastinal or hilar adenopathy.  Upper Abdomen:  Reflux of contrast into the IVC and hepatic veins.  Bones/Musculoskeletal:  Remote anterior left rib fractures.  Review of the MIP images confirms the above findings.  IMPRESSION: 1.  No evidence of pulmonary embolism. 2. Findings of congestive heart failure,  mild. 3. Benign pulmonary nodules, similar back to 2008.   Electronically Signed   By: Abigail Miyamoto M.D.   On: 01/06/2014 19:12   Dg Chest Portable 1 View  01/06/2014   CLINICAL DATA:  Shortness of breath and chest pain  EXAM: PORTABLE CHEST - 1 VIEW  COMPARISON:  12/21/2013  FINDINGS: The cardiac shadow is stable. Very mild interstitial changes are seen but stable from the prior exam. No focal infiltrate is noted. No acute bony abnormality is seen.  IMPRESSION: No acute abnormality noted.   Electronically Signed   By: Inez Catalina M.D.   On: 01/06/2014 10:58      Recent Lab Findings: Lab Results  Component Value Date   WBC 17.9* 01/07/2014   HGB 13.6 01/07/2014   HCT 41.1  01/07/2014   PLT 195 01/07/2014   GLUCOSE 135* 01/07/2014   CHOL 136 09/29/2013   TRIG 152* 09/29/2013   HDL 46 09/29/2013   LDLDIRECT 66 04/14/2012   LDLCALC 60 09/29/2013   ALT 15 01/06/2014   AST 47* 01/06/2014   NA 138 01/07/2014   K 4.9 01/07/2014   CL 95* 01/07/2014   CREATININE 0.63 01/07/2014   BUN 19 01/07/2014   CO2 23 01/07/2014   TSH 0.274* 01/06/2014   INR 0.99 01/06/2014   HGBA1C 7.0* 10/26/2013      Assessment / Plan:      63 year old diabetic with severe coronary disease and rapid atrial fibrillation. She also has significant underlying COPD from history smoking and anxiety. She has a significant leukocytosis of unknown etiology. She has significant LV dysfunction and has not yet had a echocardiogram to assess valvular disease--2 years ago her echo showed moderate TR moderate MR.  Plan--follow cardiac enzymes and allow time for recovery from her non-ST elevation MI. Review echocardiogram. Obtain full cultures to assess significant leukocytosis. Probable CABG and 48 hours pending the above studies. Plan discussed with patient   @ME1 @ 01/07/2014 2:39 PM

## 2014-01-07 NOTE — Progress Notes (Signed)
[facility] for Heparin Indication: 3v CAD with IABP in place  Allergies  Allergen Reactions  . Adhesive [Tape] Other (See Comments)    Tears skin off.   . Latex Other (See Comments)    In tape, tears skin off.  . Zocor [Simvastatin - High Dose] Nausea And Vomiting    Patient Measurements: Height: 5\' 7"  (170.2 cm) Weight: 135 lb 12.9 oz (61.6 kg) IBW/kg (Calculated) : 61.6  Vital Signs: Temp: 99.2 F (37.3 C) (03/15 1600) Temp src: Oral (03/15 1600) BP: 150/62 mmHg (03/15 1800) Pulse Rate: 85 (03/15 1800)  Labs:  Recent Labs  01/06/14 1025  01/06/14 1039 01/06/14 1955 01/07/14 0325 01/07/14 0945 01/07/14 0948 01/07/14 1655  HGB  --   < > 13.5 14.1 13.6  --   --   --   HCT  --   --  42.8 42.5 41.1  --   --   --   PLT  --   --  194 210 195  --   --   --   APTT 31  --   --  39*  --   --   --   --   LABPROT 12.8  --   --  12.9  --   --   --   --   INR 0.98  --   --  0.99  --   --   --   --   HEPARINUNFRC  --   --   --  0.13* 0.67 0.60  --   --   CREATININE  --   --  0.68 0.67 0.63  --   --   --   CKTOTAL  --   --   --   --   --   --   --  380*  CKMB  --   --   --   --   --   --   --  68.6*  TROPONINI  --   < > 0.80* 2.29* 7.42*  --  10.90*  --   < > = values in this interval not displayed.  Estimated Creatinine Clearance: 69.1 ml/min (by C-G formula based on Cr of 0.63).   Assessment: 63yo F s/p cath 3/15 which showed L main/3v disease with severe LV dysfunction. IABP placed. Plan for CABG in next 48 hours. To restart heparin post cath.  Goal of Therapy:  Heparin level 0.2-0.5 units/ml with IABP in place Monitor platelets by anticoagulation protocol: Yes   Plan:  Start heparin gtt at 750 units/hr. No bolus F/u 6 hr heparin level Daily heparin level and CBC  Sherlon Handing, PharmD, BCPS Clinical pharmacist, pager [phone] 01/07/2014 7:34 PM

## 2014-01-07 NOTE — Progress Notes (Signed)
ANTICOAGULATION CONSULT NOTE - Follow Up Consult  Pharmacy Consult for heparin Indication: chest pain/ACS and atrial fibrillation  Labs:  Recent Labs  01/06/14 1025  01/06/14 1039 01/06/14 1955 01/07/14 0325  HGB  --   < > 13.5 14.1 13.6  HCT  --   --  42.8 42.5 41.1  PLT  --   --  194 210 195  APTT 31  --   --  39*  --   LABPROT 12.8  --   --  12.9  --   INR 0.98  --   --  0.99  --   HEPARINUNFRC  --   --   --  0.13* 0.67  CREATININE  --   --  0.68 0.67 0.63  TROPONINI  --   --  0.80* 2.29* 7.42*  < > = values in this interval not displayed.   Assessment/Plan:  63yo female now therapeutic on heparin after rate increase for Afib and CP, troponin continues to increase.  Will continue gtt at current rate and confirm stable with additional level.  Wynona Neat, PharmD, BCPS  01/07/2014,5:17 AM

## 2014-01-08 ENCOUNTER — Other Ambulatory Visit: Payer: Self-pay | Admitting: *Deleted

## 2014-01-08 ENCOUNTER — Inpatient Hospital Stay (HOSPITAL_COMMUNITY): Payer: Medicare Other

## 2014-01-08 DIAGNOSIS — I517 Cardiomegaly: Secondary | ICD-10-CM

## 2014-01-08 DIAGNOSIS — Z01810 Encounter for preprocedural cardiovascular examination: Secondary | ICD-10-CM

## 2014-01-08 DIAGNOSIS — I2510 Atherosclerotic heart disease of native coronary artery without angina pectoris: Secondary | ICD-10-CM

## 2014-01-08 LAB — COMPREHENSIVE METABOLIC PANEL
ALT: 17 U/L (ref 0–35)
AST: 52 U/L — ABNORMAL HIGH (ref 0–37)
Albumin: 3.2 g/dL — ABNORMAL LOW (ref 3.5–5.2)
Alkaline Phosphatase: 61 U/L (ref 39–117)
BUN: 31 mg/dL — ABNORMAL HIGH (ref 6–23)
CO2: 29 mEq/L (ref 19–32)
Calcium: 9.4 mg/dL (ref 8.4–10.5)
Chloride: 92 mEq/L — ABNORMAL LOW (ref 96–112)
Creatinine, Ser: 0.72 mg/dL (ref 0.50–1.10)
GFR calc Af Amer: >90 mL/min (ref >90)
GFR calc non Af Amer: 89 mL/min — ABNORMAL LOW (ref >90)
Glucose, Bld: 206 mg/dL — ABNORMAL HIGH (ref 70–99)
Potassium: 4.1 mEq/L (ref 3.7–5.3)
Sodium: 133 mEq/L — ABNORMAL LOW (ref 137–147)
Total Bilirubin: 0.3 mg/dL (ref 0.3–1.2)
Total Protein: 6.6 g/dL (ref 6.0–8.3)

## 2014-01-08 LAB — URINE CULTURE
Colony Count: NO GROWTH
Culture: NO GROWTH

## 2014-01-08 LAB — CBC
HEMATOCRIT: 38.9 % (ref 36.0–46.0)
Hemoglobin: 12.8 g/dL (ref 12.0–15.0)
MCH: 30.7 pg (ref 26.0–34.0)
MCHC: 32.9 g/dL (ref 30.0–36.0)
MCV: 93.3 fL (ref 78.0–100.0)
Platelets: 154 10*3/uL (ref 150–400)
RBC: 4.17 MIL/uL (ref 3.87–5.11)
RDW: 13.7 % (ref 11.5–15.5)
WBC: 16.2 10*3/uL — ABNORMAL HIGH (ref 4.0–10.5)

## 2014-01-08 LAB — GLUCOSE, CAPILLARY
GLUCOSE-CAPILLARY: 140 mg/dL — AB (ref 70–99)
GLUCOSE-CAPILLARY: 306 mg/dL — AB (ref 70–99)
Glucose-Capillary: 191 mg/dL — ABNORMAL HIGH (ref 70–99)
Glucose-Capillary: 194 mg/dL — ABNORMAL HIGH (ref 70–99)

## 2014-01-08 LAB — HEMOGLOBIN A1C
Hgb A1c MFr Bld: 7.3 % — ABNORMAL HIGH (ref <5.7)
Mean Plasma Glucose: 163 mg/dL — ABNORMAL HIGH (ref <117)

## 2014-01-08 LAB — CK TOTAL AND CKMB (NOT AT ARMC)
CK, MB: 30.2 ng/mL (ref 0.3–4.0)
Relative Index: 12.6 — ABNORMAL HIGH (ref 0.0–2.5)
Total CK: 239 U/L — ABNORMAL HIGH (ref 7–177)

## 2014-01-08 LAB — PREPARE RBC (CROSSMATCH)

## 2014-01-08 LAB — HEPARIN LEVEL (UNFRACTIONATED)
HEPARIN UNFRACTIONATED: 0.19 [IU]/mL — AB (ref 0.30–0.70)
Heparin Unfractionated: 0.1 IU/mL — ABNORMAL LOW (ref 0.30–0.70)

## 2014-01-08 LAB — TSH: TSH: 0.069 u[IU]/mL — ABNORMAL LOW (ref 0.350–4.500)

## 2014-01-08 LAB — ABO/RH: ABO/RH(D): O POS

## 2014-01-08 MED ORDER — DOPAMINE-DEXTROSE 3.2-5 MG/ML-% IV SOLN
2.0000 ug/kg/min | INTRAVENOUS | Status: AC
  Administered 2014-01-09: 3 ug/kg/min via INTRAVENOUS
  Filled 2014-01-08: qty 250

## 2014-01-08 MED ORDER — CHLORHEXIDINE GLUCONATE CLOTH 2 % EX PADS: 6.0000 | MEDICATED_PAD | Freq: Once | CUTANEOUS | Status: DC

## 2014-01-08 MED ORDER — INSULIN GLARGINE 100 UNIT/ML ~~LOC~~ SOLN
12.0000 [IU] | Freq: Every day | SUBCUTANEOUS | Status: DC
  Administered 2014-01-08: 12 [IU] via SUBCUTANEOUS
  Filled 2014-01-08 (×2): qty 0.12

## 2014-01-08 MED ORDER — TEMAZEPAM 15 MG PO CAPS
15.0000 mg | ORAL_CAPSULE | Freq: Once | ORAL | Status: AC | PRN
  Administered 2014-01-08: 15 mg via ORAL
  Filled 2014-01-08: qty 1

## 2014-01-08 MED ORDER — INSULIN ASPART 100 UNIT/ML ~~LOC~~ SOLN
0.0000 [IU] | SUBCUTANEOUS | Status: DC
  Administered 2014-01-08: 4 [IU] via SUBCUTANEOUS
  Administered 2014-01-08: 3 [IU] via SUBCUTANEOUS
  Administered 2014-01-08: 15 [IU] via SUBCUTANEOUS
  Administered 2014-01-08 (×2): 4 [IU] via SUBCUTANEOUS

## 2014-01-08 MED ORDER — PHENYLEPHRINE HCL 10 MG/ML IJ SOLN
30.0000 ug/min | INTRAVENOUS | Status: AC
  Administered 2014-01-09: 10 ug/min via INTRAVENOUS
  Filled 2014-01-08: qty 2

## 2014-01-08 MED ORDER — ENSURE COMPLETE PO LIQD
237.0000 mL | Freq: Two times a day (BID) | ORAL | Status: DC
  Administered 2014-01-08: 237 mL via ORAL

## 2014-01-08 MED ORDER — ALPRAZOLAM 0.25 MG PO TABS
0.2500 mg | ORAL_TABLET | ORAL | Status: DC | PRN
  Administered 2014-01-08: 0.5 mg via ORAL
  Filled 2014-01-08: qty 2

## 2014-01-08 MED ORDER — SODIUM CHLORIDE 0.9 % IV SOLN: INTRAVENOUS | Status: AC

## 2014-01-08 MED ORDER — NOREPINEPHRINE BITARTRATE 1 MG/ML IJ SOLN
2.0000 ug/min | INTRAVENOUS | Status: DC
  Administered 2014-01-08: 5 ug/min via INTRAVENOUS
  Filled 2014-01-08: qty 4

## 2014-01-08 MED ORDER — DEXTROSE 5 % IV SOLN
750.0000 mg | INTRAVENOUS | Status: DC
  Filled 2014-01-08: qty 750

## 2014-01-08 MED ORDER — BISACODYL 5 MG PO TBEC
5.0000 mg | DELAYED_RELEASE_TABLET | Freq: Once | ORAL | Status: DC
  Filled 2014-01-08: qty 1

## 2014-01-08 MED ORDER — METOPROLOL TARTRATE 12.5 MG HALF TABLET
12.5000 mg | ORAL_TABLET | Freq: Once | ORAL | Status: DC
  Filled 2014-01-08: qty 1

## 2014-01-08 MED ORDER — HYDROCORTISONE SOD SUCCINATE 100 MG PF FOR IT USE
100.0000 mg | Freq: Once | INTRAMUSCULAR | Status: DC
  Filled 2014-01-08: qty 1

## 2014-01-08 MED ORDER — SODIUM CHLORIDE 0.9 % IV SOLN
INTRAVENOUS | Status: AC
  Administered 2014-01-09: 69.8 mL/h via INTRAVENOUS
  Filled 2014-01-08: qty 40

## 2014-01-08 MED ORDER — CHLORHEXIDINE GLUCONATE CLOTH 2 % EX PADS
6.0000 | MEDICATED_PAD | Freq: Once | CUTANEOUS | Status: AC
  Administered 2014-01-08: 6 via TOPICAL

## 2014-01-08 MED ORDER — BIOTENE DRY MOUTH MT LIQD
15.0000 mL | Freq: Two times a day (BID) | OROMUCOSAL | Status: DC
  Administered 2014-01-08: 15 mL via OROMUCOSAL

## 2014-01-08 MED ORDER — NITROGLYCERIN IN D5W 200-5 MCG/ML-% IV SOLN
2.0000 ug/min | INTRAVENOUS | Status: AC
  Administered 2014-01-09: 5 ug/min via INTRAVENOUS
  Filled 2014-01-08: qty 250

## 2014-01-08 MED ORDER — HYDROCORTISONE NA SUCCINATE PF 100 MG IJ SOLR
100.0000 mg | Freq: Once | INTRAMUSCULAR | Status: AC
  Administered 2014-01-09: 100 mg via INTRAVENOUS
  Filled 2014-01-08: qty 2

## 2014-01-08 MED ORDER — SODIUM CHLORIDE 0.9 % IV SOLN
INTRAVENOUS | Status: DC
  Filled 2014-01-08: qty 30

## 2014-01-08 MED ORDER — DIAZEPAM 5 MG PO TABS
5.0000 mg | ORAL_TABLET | Freq: Once | ORAL | Status: AC
  Administered 2014-01-09: 5 mg via ORAL
  Filled 2014-01-08: qty 1

## 2014-01-08 MED ORDER — HEPARIN (PORCINE) IN NACL 100-0.45 UNIT/ML-% IJ SOLN
900.0000 [IU]/h | INTRAMUSCULAR | Status: DC
  Administered 2014-01-08: 900 [IU]/h via INTRAVENOUS
  Filled 2014-01-08: qty 250

## 2014-01-08 MED ORDER — EPINEPHRINE HCL 1 MG/ML IJ SOLN
0.5000 ug/min | INTRAMUSCULAR | Status: DC
  Filled 2014-01-08: qty 4

## 2014-01-08 MED ORDER — MAGNESIUM SULFATE 50 % IJ SOLN
40.0000 meq | INTRAMUSCULAR | Status: DC
  Filled 2014-01-08: qty 10

## 2014-01-08 MED ORDER — DEXTROSE 5 % IV SOLN
1.5000 g | INTRAVENOUS | Status: AC
  Administered 2014-01-09: .75 g via INTRAVENOUS
  Administered 2014-01-09: 1.5 g via INTRAVENOUS
  Filled 2014-01-08: qty 1.5

## 2014-01-08 MED ORDER — POTASSIUM CHLORIDE 2 MEQ/ML IV SOLN
80.0000 meq | INTRAVENOUS | Status: DC
  Filled 2014-01-08: qty 40

## 2014-01-08 MED ORDER — SODIUM CHLORIDE 0.9 % IV SOLN
INTRAVENOUS | Status: AC
  Administered 2014-01-09: 3.9 [IU]/h via INTRAVENOUS
  Filled 2014-01-08: qty 1

## 2014-01-08 MED ORDER — PLASMA-LYTE 148 IV SOLN
INTRAVENOUS | Status: AC
  Administered 2014-01-09: 09:00:00
  Filled 2014-01-08: qty 2.5

## 2014-01-08 MED ORDER — DEXMEDETOMIDINE HCL IN NACL 400 MCG/100ML IV SOLN
0.1000 ug/kg/h | INTRAVENOUS | Status: AC
  Administered 2014-01-09: .3 ug/kg/h via INTRAVENOUS
  Filled 2014-01-08: qty 100

## 2014-01-08 MED ORDER — SODIUM CHLORIDE 0.9 % IV SOLN
1250.0000 mg | INTRAVENOUS | Status: AC
  Administered 2014-01-09: 1250 mg via INTRAVENOUS
  Filled 2014-01-08: qty 1250

## 2014-01-08 NOTE — Progress Notes (Signed)
UR Completed.  Dally Oshel Jane [phone] 01/08/2014  

## 2014-01-08 NOTE — Progress Notes (Signed)
ANTICOAGULATION CONSULT NOTE - Follow Up Consult  Pharmacy Consult for heparin Indication: CAD/IABP  Labs:  Recent Labs  01/06/14 1025  01/06/14 1039  01/06/14 1955 01/07/14 0325 01/07/14 0945 01/07/14 0948 01/07/14 1655 01/08/14 0130  HGB  --   < > 13.5  --  14.1 13.6  --   --   --  12.8  HCT  --   < > 42.8  --  42.5 41.1  --   --   --  38.9  PLT  --   < > 194  --  210 195  --   --   --  154  APTT 31  --   --   --  39*  --   --   --   --   --   LABPROT 12.8  --   --   --  12.9  --   --   --   --   --   INR 0.98  --   --   --  0.99  --   --   --   --   --   HEPARINUNFRC  --   --   --   < > 0.13* 0.67 0.60  --   --  0.10*  CREATININE  --   --  0.68  --  0.67 0.63  --   --   --   --   CKTOTAL  --   --   --   --   --   --   --   --  380*  --   CKMB  --   --   --   --   --   --   --   --  68.6*  --   TROPONINI  --   < > 0.80*  --  2.29* 7.42*  --  10.90*  --   --   < > = values in this interval not displayed.   Assessment: 63yo female subtherapeutic on heparin after resumed at low rate s/p IABP.  Goal of Therapy:  Heparin level 0.2-0.5 units/ml   Plan:  Will increase heparin gtt slightly to 850 units/h (was 0.6 at 950 units/hr) and check level in 6hr.  Wynona Neat, PharmD, BCPS  01/08/2014,2:43 AM

## 2014-01-08 NOTE — Progress Notes (Signed)
ANTICOAGULATION CONSULT NOTE - Follow Up Consult  Pharmacy Consult for heparin Indication: CAD/IABP  Labs:  Recent Labs  01/06/14 1025  01/06/14 1955 01/07/14 0325 01/07/14 0945 01/07/14 0948 01/07/14 1655 01/08/14 0130 01/08/14 1010  HGB  --   < > 14.1 13.6  --   --   --  12.8  --   HCT  --   < > 42.5 41.1  --   --   --  38.9  --   PLT  --   < > 210 195  --   --   --  154  --   APTT 31  --  39*  --   --   --   --   --   --   LABPROT 12.8  --  12.9  --   --   --   --   --   --   INR 0.98  --  0.99  --   --   --   --   --   --   HEPARINUNFRC  --   < > 0.13* 0.67 0.60  --   --  0.10* 0.19*  CREATININE  --   < > 0.67 0.63  --   --   --   --  0.72  CKTOTAL  --   --   --   --   --   --  380* 239*  --   CKMB  --   --   --   --   --   --  68.6* 30.2*  --   TROPONINI  --   < > 2.29* 7.42*  --  10.90*  --   --   --   < > = values in this interval not displayed.   Assessment: 63yo female subtherapeutic on heparin  s/p IABP and heparin level increased (HL= 0.19) but below goal after heparin increase to 850 units/hr.  Goal of Therapy:  Heparin level 0.2-0.5 units/ml   Plan:  -Increase heparin to 900 units/hr -Heparin level in am  Hildred Laser, Pharm D 01/08/2014 1:17 PM

## 2014-01-08 NOTE — Progress Notes (Signed)
1 Day Post-Op Procedure(s) (LRB): LEFT HEART CATHETERIZATION WITH CORONARY ANGIOGRAM (N/A) Subjective: Severe three-vessel coronary disease with unstable angina Balloon pump in cath lab, currently on 2 mcg of norepinephrine White count improved to 16,000-cultures negative, chest x-ray without infiltrate 2-D echocardiogram shows no significant MR Patient has a greater than 10 year history of atrial fibrillation-will probably defer maze procedure and consider atrial clip   Objective: Vital signs in last 24 hours: Temp:  [98.3 F (36.8 C)-98.9 F (37.2 C)] 98.6 F (37 C) (03/16 1600) Pulse Rate:  [40-94] 79 (03/16 1800) Cardiac Rhythm:  [-] Atrial fibrillation (03/16 0800) Resp:  [12-23] 18 (03/16 1800) BP: (101-158)/(51-93) 144/60 mmHg (03/16 1800) SpO2:  [94 %-100 %] 100 % (03/16 1800) Weight:  [137 lb 2 oz (62.2 kg)] 137 lb 2 oz (62.2 kg) (03/16 0600)  Hemodynamic parameters for last 24 hours:   Blood pressure 90/50 with good balloon pump augmentation, heart rate Intake/Output from previous day: 03/15 0701 - 03/16 0700 In: 1406.6 [P.O.:610; I.V.:796.6] Out: 2515 [Urine:2515] Intake/Output this shift: Total I/O In: 2568.8 [I.V.:2568.8] Out: 1450 [Urine:1450]  Warm extremities Neuro intact  Lab Results:  Recent Labs  01/07/14 0325 01/08/14 0130  WBC 17.9* 16.2*  HGB 13.6 12.8  HCT 41.1 38.9  PLT 195 154   BMET:  Recent Labs  01/07/14 0325 01/08/14 1010  NA 138 133*  K 4.9 4.1  CL 95* 92*  CO2 23 29  GLUCOSE 135* 206*  BUN 19 31*  CREATININE 0.63 0.72  CALCIUM 10.7* 9.4    PT/INR:  Recent Labs  01/06/14 1955  LABPROT 12.9  INR 0.99   ABG    Component Value Date/Time   PHART 7.383 01/07/2014 1519   HCO3 23.8 01/07/2014 1519   TCO2 25 01/07/2014 1519   ACIDBASEDEF 1.0 01/07/2014 1519   O2SAT 96.0 01/07/2014 1519   CBG (last 3)   Recent Labs  01/08/14 0751 01/08/14 1108 01/08/14 1610  GLUCAP 140* 191* 306*    Assessment/Plan: S/P  Procedure(s) (LRB): LEFT HEART CATHETERIZATION WITH CORONARY ANGIOGRAM (N/A) Plan multivessel CABG in a.m. Procedure indications and risks discussed with patient. She understands she is at high risk into her underlying chronic lung disease, poorly controlled diabetes, diffuse severe coronary disease and reduced LVEF 30%   LOS: 2 days    VAN TRIGT III,PETER 01/08/2014

## 2014-01-08 NOTE — Progress Notes (Signed)
VASCULAR LAB PRELIMINARY  PRELIMINARY  PRELIMINARY  PRELIMINARY  Pre-op Cardiac Surgery  Carotid Findings:  Right:  1-39% ICA stenosis.  Left:  60-79% internal carotid artery stenosis.  Bilateral:  Vertebral artery flow is antegrade.     Ralene Cork, RVT 01/08/2014 12:19 PM     Upper Extremity Right Left  Brachial Pressures    Radial Waveforms pending   Ulnar Waveforms    Palmar Arch (Allen's Test)     Findings:      Lower  Extremity Right Left  Dorsalis Pedis    Anterior Tibial    Posterior Tibial    Ankle/Brachial Indices      Findings:

## 2014-01-08 NOTE — Progress Notes (Addendum)
       Patient Name: Mary [name] Date of Encounter: 01/08/2014    SUBJECTIVE: She denies angina. Her back is feeling stiff. She denies dyspnea.  TELEMETRY:  Atrial fibrillation with controlled rate: Filed Vitals:   01/08/14 0827 01/08/14 0900 01/08/14 1000 01/08/14 1100  BP:  146/85 152/70 128/64  Pulse:  40 85 72  Temp:      TempSrc:      Resp:  13 16 20   Height:      Weight:      SpO2: 100% 100% 100% 100%    Intake/Output Summary (Last 24 hours) at 01/08/14 1159 Last data filed at 01/08/14 1000  Gross per 24 hour  Intake 1353.13 ml  Output   1915 ml  Net -561.87 ml    LABS: Basic Metabolic Panel:  Recent Labs  01/06/14 1955 01/07/14 0325 01/08/14 1010  NA 137 138 133*  K 5.1 4.9 4.1  CL 94* 95* 92*  CO2 26 23 29   GLUCOSE 255* 135* 206*  BUN 18 19 31*  CREATININE 0.67 0.63 0.72  CALCIUM 10.9* 10.7* 9.4  MG 2.1  --   --    CBC:  Recent Labs  01/06/14 1039 01/06/14 1955 01/07/14 0325 01/08/14 0130  WBC 16.4* 22.1* 17.9* 16.2*  NEUTROABS 14.5* 20.1*  --   --   HGB 13.5 14.1 13.6 12.8  HCT 42.8 42.5 41.1 38.9  MCV 96.2 94.9 94.7 93.3  PLT 194 210 195 154   Cardiac Enzymes:  Recent Labs  01/06/14 1955 01/07/14 0325 01/07/14 0948 01/07/14 1655 01/08/14 0130  CKTOTAL  --   --   --  380* 239*  CKMB  --   --   --  68.6* 30.2*  TROPONINI 2.29* 7.42* 10.90*  --   --      Radiology/Studies:  No new data. Balloon pump was in good position on chest x-ray performed on 01/08/2014.  No overt evidence of heart failure.  Physical Exam: Blood pressure 128/64, pulse 72, temperature 98.4 F (36.9 C), temperature source Oral, resp. rate 20, height 5\' 7"  (1.702 m), weight 137 lb 2 oz (62.2 kg), SpO2 100.00%. Weight change: -4 lb 14 oz (-2.211 kg)   Neck veins not distended Chest is clear to auscultation and percussion Cardiac exam reveals no obvious murmur Intra-aortic balloon site in right femoral is unremarkable without  hematoma.  ASSESSMENT:  1. Severe three-vessel coronary disease with left main and non-ST elevation myocardial infarction. No recurrence of chest discomfort to this point.  2. Acute on chronic systolic heart failure, compensated with the patient lying flat. Functionally, the intra-aortic balloon pump is helpful  3. Hypotension improved with intra-aortic balloon pump  and pressor support    4. Chronic atrial fibrillation currently with rate control  Plan:  1. Continue hemodynamic support with the balloon pump. 2. May start IV nitroglycerin to give additional anti ischemic protection once off pressor therapy 3. Awaiting surgical revascularization pending her clinical course   Demetrios Isaacs 01/08/2014, 11:59 AM

## 2014-01-08 NOTE — Progress Notes (Signed)
[id number] Discussed with pt sternal precautions and how important it will be to mobilize after surgery without straining arms. Discussed importance of IS. Pt has OHS booklet and care guide given for family. Discussed with pt need for someone to be with her after surgery for at least the first week. Pt states her sister is in [place] and she has two grand daughters. Discharge planning will need to be discussed with granddaughters after surgery.  Graylon Good RN BSN 01/08/2014 3:09 PM

## 2014-01-08 NOTE — Progress Notes (Signed)
Echo Lab  2D Echocardiogram completed.  [place], [place] 01/08/2014 2:23 PM

## 2014-01-08 NOTE — Progress Notes (Signed)
Low augmented BP noted. Will add low dose levophed, check Hct, and hold any meds that can cause hypotension. Saline bolus

## 2014-01-08 NOTE — Progress Notes (Signed)
INITIAL NUTRITION ASSESSMENT  DOCUMENTATION CODES Per approved criteria  -Not Applicable   INTERVENTION: - Ensure Complete po BID, each supplement provides 350 kcal and 13 grams of protein - Continue to encourage meals and snacks  NUTRITION DIAGNOSIS: Inadequate oral intake related to chest pain as evidenced by 10 lb wt loss.   Goal: Pt to meet >/= 90% of their estimated nutrition needs   Monitor:  Wt trends, acceptance of supplements, labs  Reason for Assessment: MST  63 y.o. female  Admitting Dx: <principal problem not specified>  ASSESSMENT: 63 y.o. Female, with a hx of atrial fibrillation, HTN, COPD, tachycardia, CHF and SOB who presente to the Emergency Department complaining of constant, moderate, sharp left sided chest pain that has been getting progressively getting worse since MN last night (3/13).  Pt reports that she eats, but sometimes skips meals. She says that her appetite decreased over the last several days. Pt has had a 10 lb wt loss over the past thee months. She agreed to try Ensure Complete.   Height: Ht Readings from Last 1 Encounters:  01/06/14 5\' 7"  (1.702 m)    Weight: Wt Readings from Last 1 Encounters:  01/08/14 137 lb 2 oz (62.2 kg)    Ideal Body Weight: 61.6 kg  % Ideal Body Weight: 101%  Wt Readings from Last 10 Encounters:  01/08/14 137 lb 2 oz (62.2 kg)  01/08/14 137 lb 2 oz (62.2 kg)  12/21/13 142 lb (64.411 kg)  12/06/13 144 lb (65.318 kg)  11/06/13 145 lb (65.772 kg)  10/26/13 140 lb 9.6 oz (63.776 kg)  10/23/13 143 lb (64.864 kg)  10/04/13 149 lb 6.4 oz (67.767 kg)  09/29/13 150 lb (68.04 kg)  09/19/13 148 lb 4.8 oz (67.268 kg)    Usual Body Weight: 147 lbs  % Usual Body Weight: 93%  BMI:  Body mass index is 21.47 kg/(m^2).  Estimated Nutritional Needs: Kcal: 1550-1850 Protein: 75-85 g Fluid: ~1.9 L/day  Skin: WNL  Diet Order:    EDUCATION NEEDS: -Education needs addressed   Intake/Output Summary (Last 24  hours) at 01/08/14 1251 Last data filed at 01/08/14 1200  Gross per 24 hour  Intake 1390.13 ml  Output   1915 ml  Net -524.87 ml    Last BM: none recorded   Labs:   Recent Labs Lab 01/06/14 1955 01/07/14 0325 01/08/14 1010  NA 137 138 133*  K 5.1 4.9 4.1  CL 94* 95* 92*  CO2 26 23 29   BUN 18 19 31*  CREATININE 0.67 0.63 0.72  CALCIUM 10.9* 10.7* 9.4  MG 2.1  --   --   GLUCOSE 255* 135* 206*    CBG (last 3)   Recent Labs  01/07/14 2113 01/08/14 0751 01/08/14 1108  GLUCAP 236* 140* 191*    Scheduled Meds: . [START ON 01/09/2014] aminocaproic acid (AMICAR) for OHS   Intravenous To OR  . amiodarone  200 mg Oral BID  . antiseptic oral rinse  15 mL Mouth Rinse BID  . aspirin  81 mg Oral Daily  . atorvastatin  20 mg Oral q1800  . bisacodyl  5 mg Oral Once  . budesonide-formoterol  2 puff Inhalation BID  . Canagliflozin  1 tablet Oral Daily  . [START ON 01/09/2014] cefUROXime (ZINACEF)  IV  1.5 g Intravenous To OR  . [START ON 01/09/2014] cefUROXime (ZINACEF)  IV  750 mg Intravenous To OR  . Chlorhexidine Gluconate Cloth  6 each Topical Q0600  . Chlorhexidine Gluconate  Cloth  6 each Topical Once   And  . Chlorhexidine Gluconate Cloth  6 each Topical Once  . [START ON 01/09/2014] dexmedetomidine  0.1-0.7 mcg/kg/hr Intravenous To OR  . [START ON 01/09/2014] diazepam  5 mg Oral Once  . [START ON 01/09/2014] DOPamine  2-20 mcg/kg/min Intravenous To OR  . [START ON 01/09/2014] epinephrine  0.5-20 mcg/min Intravenous To OR  . FLUoxetine  40 mg Oral Daily  . [START ON 01/09/2014] heparin-papaverine-plasmalyte irrigation   Irrigation To OR  . [START ON 01/09/2014] heparin 30,000 units/NS 1000 mL solution for CELLSAVER   Other To OR  . insulin aspart  0-20 Units Subcutaneous 6 times per day  . insulin glargine  12 Units Subcutaneous QHS  . [START ON 01/09/2014] insulin (NOVOLIN-R) infusion   Intravenous To OR  . levofloxacin  500 mg Oral Q24H  . linagliptin  5 mg Oral Daily  .  [START ON 01/09/2014] magnesium sulfate  40 mEq Other To OR  . mupirocin ointment  1 application Nasal BID  . [START ON 01/09/2014] nitroGLYCERIN  2-200 mcg/min Intravenous To OR  . [START ON 01/09/2014] phenylephrine (NEO-SYNEPHRINE) Adult infusion  30-200 mcg/min Intravenous To OR  . potassium chloride  10 mEq Oral Daily  . [START ON 01/09/2014] potassium chloride  80 mEq Other To OR  . predniSONE  40 mg Oral Daily  . [START ON 01/09/2014] vancomycin  1,250 mg Intravenous To OR    Continuous Infusions: . sodium chloride    . heparin 850 Units/hr (01/08/14 0800)  . norepinephrine (LEVOPHED) Adult infusion    . phenylephrine (NEO-SYNEPHRINE) Adult infusion Stopped (01/07/14 1544)    Past Medical History  Diagnosis Date  . Diabetes mellitus   . Arthritis   . Anxiety   . Chronic pain     Bacl pain, Disc L5-S1- Dr. Joya Salm in [place]  . Hyperlipidemia   . Hypertension   . Tachycardia   . Bronchial asthma   . Atrial fibrillation   . COPD (chronic obstructive pulmonary disease)   . DDD (degenerative disc disease)     Radicular symptoms  . Shortness of breath   . CHF (congestive heart failure)   . Hx of echocardiogram     Was interpreted by Dr Doylene Canard that showed an Ef in the 50-60% range with grade 1 diastolic dysfunction. she had moderate MR, biatrial enlargement, moderate TR and at that time estimated RV systolic pressure was 43 mm.  . History of stress test 06/2011    Abnormal myocardial perfusion study.    Past Surgical History  Procedure Laterality Date  . Carpal tunnel release      x 2  . Breast cyst removed    . Abdominal hysterectomy      partial  . Tubal ligation    . Tonsillectomy      Terrace Arabia RD, LDN

## 2014-01-08 NOTE — Progress Notes (Signed)
Inpatient Diabetes Program Recommendations  AACE/ADA: New Consensus Statement on Inpatient Glycemic Control (2013)  Target Ranges:  Prepandial:   less than 140 mg/dL      Peak postprandial:   less than 180 mg/dL (1-2 hours)      Critically ill patients:  140 - 180 mg/dL   Results for MAREDITH, SLAVICK (MRN [id number]) as of 01/08/2014 09:15  Ref. Range 01/07/2014 08:10 01/07/2014 17:55 01/07/2014 21:13 01/08/2014 07:51  Glucose-Capillary Latest Range: 70-99 mg/dL 157 (H) 184 (H) 236 (H) 140 (H)   Diabetes history: DM2 Outpatient Diabetes medications: Invokana 100 mg daily, Januvia 100 mg daily Current orders for Inpatient glycemic control: Invokana 100 mb daily, Tradjenta 5 mg daily, Lantus 12 units QHS, and Novolog 0-20 units Q4H  Inpatient Diabetes Program Recommendations Insulin - IV drip/GlucoStabilizer: Noted IV insulin/GlucoStabilizer has been ordered to start tomorrow morning for CABG on 3/17. Insulin - Basal: Noted Lantus 12 units QHS was ordered this morning.  Please consider discontinuing Lantus order.  On 3/15 CBG ranged from 157-236 and patient only received a total of 4 units of Novolog for correction on 3/15. Fasting CBG this morning 140 mg/dl.  Note: Patient has a history of diabetes and takes Invokana 100 mg daily and Januvia 100 mg daily as an outpatient for diabetes control.  A1C on 10/26/13 was 7.0% which indicates fair control.  CBGs ranged from 157-236 mg/dl on 3/15 and patient only received a total of Novolog 4 units for correction on 3/15.  Fasting glucose this morning was 140 mg/dl.  Noted Lantus 12 units QHS was ordered this morning and Novolog correction scale was increased from sensitive to resistant and changed to Q4H. Concerned about potential for hypoglycemia if patient receives scheduled Lantus 12 units QHS tonight.  According to the chart, patient will start on IV insulin in the morning for CABG on 3/17. Please discontinue Lantus order.  Will continue to  follow.  Thanks, [name], RN, MSN, CCRN Diabetes Coordinator Inpatient Diabetes Program [phone] (Team Pager) [phone] ([facility] office) [phone] [facility] office)

## 2014-01-09 ENCOUNTER — Encounter (HOSPITAL_COMMUNITY): Payer: Medicare Other | Admitting: Anesthesiology

## 2014-01-09 ENCOUNTER — Inpatient Hospital Stay (HOSPITAL_COMMUNITY): Payer: Medicare Other | Admitting: Anesthesiology

## 2014-01-09 ENCOUNTER — Inpatient Hospital Stay (HOSPITAL_COMMUNITY): Payer: Medicare Other

## 2014-01-09 ENCOUNTER — Encounter (HOSPITAL_COMMUNITY)
Admission: EM | Disposition: A | Discharge status: Home Health | Payer: Medicare Other | Source: Home / Self Care | Attending: Cardiothoracic Surgery

## 2014-01-09 HISTORY — PX: CORONARY ARTERY BYPASS GRAFT: SHX141

## 2014-01-09 HISTORY — PX: INTRAOPERATIVE TRANSESOPHAGEAL ECHOCARDIOGRAM: SHX5062

## 2014-01-09 LAB — POCT I-STAT 3, ART BLOOD GAS (G3+)
ACID-BASE EXCESS: 5 mmol/L — AB (ref 0.0–2.0)
ACID-BASE EXCESS: 3 mmol/L — AB (ref 0.0–2.0)
Acid-Base Excess: 5 mmol/L — ABNORMAL HIGH (ref 0.0–2.0)
Acid-Base Excess: 3 mmol/L — ABNORMAL HIGH (ref 0.0–2.0)
Acid-Base Excess: 4 mmol/L — ABNORMAL HIGH (ref 0.0–2.0)
BICARBONATE: 28.4 meq/L — AB (ref 20.0–24.0)
BICARBONATE: 29.5 meq/L — AB (ref 20.0–24.0)
Bicarbonate: 29.5 mEq/L — ABNORMAL HIGH (ref 20.0–24.0)
Bicarbonate: 30.3 mEq/L — ABNORMAL HIGH (ref 20.0–24.0)
Bicarbonate: 28.5 mEq/L — ABNORMAL HIGH (ref 20.0–24.0)
O2 SAT: 94 %
O2 Saturation: 100 %
O2 Saturation: 100 %
O2 Saturation: 98 %
O2 Saturation: 95 %
PCO2 ART: 45.2 mmHg — AB (ref 35.0–45.0)
PCO2 ART: 47.2 mmHg — AB (ref 35.0–45.0)
PH ART: 7.399 (ref 7.350–7.450)
PH ART: 7.402 (ref 7.350–7.450)
Patient temperature: 36.7
TCO2: 31 mmol/L (ref 0–100)
TCO2: 32 mmol/L (ref 0–100)
TCO2: 30 mmol/L (ref 0–100)
TCO2: 30 mmol/L (ref 0–100)
TCO2: 31 mmol/L (ref 0–100)
pCO2 arterial: 41.5 mmHg (ref 35.0–45.0)
pCO2 arterial: 49 mmHg — ABNORMAL HIGH (ref 35.0–45.0)
pCO2 arterial: 50.7 mmHg — ABNORMAL HIGH (ref 35.0–45.0)
pH, Arterial: 7.461 — ABNORMAL HIGH (ref 7.350–7.450)
pH, Arterial: 7.387 (ref 7.350–7.450)
pH, Arterial: 7.372 (ref 7.350–7.450)
pO2, Arterial: 351 mmHg — ABNORMAL HIGH (ref 80.0–100.0)
pO2, Arterial: 204 mmHg — ABNORMAL HIGH (ref 80.0–100.0)
pO2, Arterial: 109 mmHg — ABNORMAL HIGH (ref 80.0–100.0)
pO2, Arterial: 76 mmHg — ABNORMAL LOW (ref 80.0–100.0)
pO2, Arterial: 75 mmHg — ABNORMAL LOW (ref 80.0–100.0)

## 2014-01-09 LAB — CBC
HCT: 26.7 % — ABNORMAL LOW (ref 36.0–46.0)
HEMATOCRIT: 36.2 % (ref 36.0–46.0)
HEMATOCRIT: 28.2 % — AB (ref 36.0–46.0)
HEMOGLOBIN: 11.9 g/dL — AB (ref 12.0–15.0)
HEMOGLOBIN: 9.4 g/dL — AB (ref 12.0–15.0)
Hemoglobin: 8.9 g/dL — ABNORMAL LOW (ref 12.0–15.0)
MCH: 30.8 pg (ref 26.0–34.0)
MCH: 31.1 pg (ref 26.0–34.0)
MCH: 31.2 pg (ref 26.0–34.0)
MCHC: 32.9 g/dL (ref 30.0–36.0)
MCHC: 33.3 g/dL (ref 30.0–36.0)
MCHC: 33.3 g/dL (ref 30.0–36.0)
MCV: 93.8 fL (ref 78.0–100.0)
MCV: 93.4 fL (ref 78.0–100.0)
MCV: 93.7 fL (ref 78.0–100.0)
Platelets: 99 10*3/uL — ABNORMAL LOW (ref 150–400)
Platelets: 124 10*3/uL — ABNORMAL LOW (ref 150–400)
Platelets: 128 10*3/uL — ABNORMAL LOW (ref 150–400)
RBC: 3.86 MIL/uL — ABNORMAL LOW (ref 3.87–5.11)
RBC: 3.02 MIL/uL — ABNORMAL LOW (ref 3.87–5.11)
RBC: 2.85 MIL/uL — ABNORMAL LOW (ref 3.87–5.11)
RDW: 13.9 % (ref 11.5–15.5)
RDW: 13.6 % (ref 11.5–15.5)
RDW: 13.6 % (ref 11.5–15.5)
WBC: 13.1 10*3/uL — ABNORMAL HIGH (ref 4.0–10.5)
WBC: 23.5 10*3/uL — ABNORMAL HIGH (ref 4.0–10.5)
WBC: 21.7 10*3/uL — ABNORMAL HIGH (ref 4.0–10.5)

## 2014-01-09 LAB — POCT I-STAT, CHEM 8
BUN: 13 mg/dL (ref 6–23)
Calcium, Ion: 1.21 mmol/L (ref 1.13–1.30)
Chloride: 100 mEq/L (ref 96–112)
Creatinine, Ser: 0.6 mg/dL (ref 0.50–1.10)
GLUCOSE: 91 mg/dL (ref 70–99)
HEMATOCRIT: 25 % — AB (ref 36.0–46.0)
Hemoglobin: 8.5 g/dL — ABNORMAL LOW (ref 12.0–15.0)
Potassium: 4.4 mEq/L (ref 3.7–5.3)
Sodium: 140 mEq/L (ref 137–147)
TCO2: 29 mmol/L (ref 0–100)

## 2014-01-09 LAB — POCT I-STAT 4, (NA,K, GLUC, HGB,HCT)
GLUCOSE: 190 mg/dL — AB (ref 70–99)
GLUCOSE: 133 mg/dL — AB (ref 70–99)
GLUCOSE: 120 mg/dL — AB (ref 70–99)
GLUCOSE: 161 mg/dL — AB (ref 70–99)
Glucose, Bld: 115 mg/dL — ABNORMAL HIGH (ref 70–99)
Glucose, Bld: 121 mg/dL — ABNORMAL HIGH (ref 70–99)
HCT: 34 % — ABNORMAL LOW (ref 36.0–46.0)
HCT: 30 % — ABNORMAL LOW (ref 36.0–46.0)
HCT: 24 % — ABNORMAL LOW (ref 36.0–46.0)
HCT: 26 % — ABNORMAL LOW (ref 36.0–46.0)
HCT: 28 % — ABNORMAL LOW (ref 36.0–46.0)
HEMATOCRIT: 26 % — AB (ref 36.0–46.0)
HEMOGLOBIN: 11.6 g/dL — AB (ref 12.0–15.0)
HEMOGLOBIN: 8.2 g/dL — AB (ref 12.0–15.0)
HEMOGLOBIN: 9.5 g/dL — AB (ref 12.0–15.0)
Hemoglobin: 10.2 g/dL — ABNORMAL LOW (ref 12.0–15.0)
Hemoglobin: 8.8 g/dL — ABNORMAL LOW (ref 12.0–15.0)
Hemoglobin: 8.8 g/dL — ABNORMAL LOW (ref 12.0–15.0)
POTASSIUM: 4 meq/L (ref 3.7–5.3)
POTASSIUM: 3.6 meq/L — AB (ref 3.7–5.3)
Potassium: 4 mEq/L (ref 3.7–5.3)
Potassium: 3.6 mEq/L — ABNORMAL LOW (ref 3.7–5.3)
Potassium: 3.5 mEq/L — ABNORMAL LOW (ref 3.7–5.3)
Potassium: 4.4 mEq/L (ref 3.7–5.3)
Sodium: 136 mEq/L — ABNORMAL LOW (ref 137–147)
Sodium: 137 mEq/L (ref 137–147)
Sodium: 134 mEq/L — ABNORMAL LOW (ref 137–147)
Sodium: 135 mEq/L — ABNORMAL LOW (ref 137–147)
Sodium: 139 mEq/L (ref 137–147)
Sodium: 140 mEq/L (ref 137–147)

## 2014-01-09 LAB — PLATELET COUNT: Platelets: 73 10*3/uL — ABNORMAL LOW (ref 150–400)

## 2014-01-09 LAB — GLUCOSE, CAPILLARY
GLUCOSE-CAPILLARY: 100 mg/dL — AB (ref 70–99)
GLUCOSE-CAPILLARY: 106 mg/dL — AB (ref 70–99)
GLUCOSE-CAPILLARY: 147 mg/dL — AB (ref 70–99)
Glucose-Capillary: 79 mg/dL (ref 70–99)
Glucose-Capillary: 110 mg/dL — ABNORMAL HIGH (ref 70–99)
Glucose-Capillary: 103 mg/dL — ABNORMAL HIGH (ref 70–99)
Glucose-Capillary: 100 mg/dL — ABNORMAL HIGH (ref 70–99)
Glucose-Capillary: 92 mg/dL (ref 70–99)
Glucose-Capillary: 98 mg/dL (ref 70–99)
Glucose-Capillary: 120 mg/dL — ABNORMAL HIGH (ref 70–99)
Glucose-Capillary: 103 mg/dL — ABNORMAL HIGH (ref 70–99)

## 2014-01-09 LAB — BASIC METABOLIC PANEL
BUN: 24 mg/dL — ABNORMAL HIGH (ref 6–23)
CO2: 29 mEq/L (ref 19–32)
Calcium: 9.1 mg/dL (ref 8.4–10.5)
Chloride: 99 mEq/L (ref 96–112)
Creatinine, Ser: 0.58 mg/dL (ref 0.50–1.10)
GFR calc Af Amer: >90 mL/min (ref >90)
GFR calc non Af Amer: >90 mL/min (ref >90)
Glucose, Bld: 94 mg/dL (ref 70–99)
Potassium: 4.2 mEq/L (ref 3.7–5.3)
Sodium: 140 mEq/L (ref 137–147)

## 2014-01-09 LAB — CREATININE, SERUM
Creatinine, Ser: 0.47 mg/dL — ABNORMAL LOW (ref 0.50–1.10)
GFR calc Af Amer: >90 mL/min (ref >90)
GFR calc non Af Amer: >90 mL/min (ref >90)

## 2014-01-09 LAB — PROTIME-INR
INR: 1.44 (ref 0.00–1.49)
Prothrombin Time: 17.2 seconds — ABNORMAL HIGH (ref 11.6–15.2)

## 2014-01-09 LAB — MAGNESIUM: Magnesium: 3 mg/dL — ABNORMAL HIGH (ref 1.5–2.5)

## 2014-01-09 LAB — HEMOGLOBIN AND HEMATOCRIT, BLOOD: HCT: 26.6 % — ABNORMAL LOW (ref 36.0–46.0)

## 2014-01-09 LAB — POCT I-STAT GLUCOSE
Glucose, Bld: 141 mg/dL — ABNORMAL HIGH (ref 70–99)
OPERATOR ID: 173792

## 2014-01-09 LAB — APTT: aPTT: 34 seconds (ref 24–37)

## 2014-01-09 SURGERY — CORONARY ARTERY BYPASS GRAFTING (CABG)
Anesthesia: General | Site: Chest

## 2014-01-09 MED ORDER — LACTATED RINGERS IV SOLN
INTRAVENOUS | Status: DC | PRN
  Administered 2014-01-09: 07:00:00 via INTRAVENOUS

## 2014-01-09 MED ORDER — MIDAZOLAM HCL 10 MG/2ML IJ SOLN
INTRAMUSCULAR | Status: AC
  Filled 2014-01-09: qty 2

## 2014-01-09 MED ORDER — ROCURONIUM BROMIDE 50 MG/5ML IV SOLN
INTRAVENOUS | Status: AC
  Filled 2014-01-09: qty 2

## 2014-01-09 MED ORDER — FENTANYL CITRATE 0.05 MG/ML IJ SOLN
INTRAMUSCULAR | Status: AC
  Filled 2014-01-09: qty 5

## 2014-01-09 MED ORDER — HEPARIN SODIUM (PORCINE) 1000 UNIT/ML IJ SOLN
INTRAMUSCULAR | Status: AC
  Filled 2014-01-09: qty 1

## 2014-01-09 MED ORDER — SODIUM CHLORIDE 0.9 % IV SOLN
INTRAVENOUS | Status: DC
  Filled 2014-01-09: qty 1

## 2014-01-09 MED ORDER — DIGOXIN 125 MCG PO TABS
0.1250 mg | ORAL_TABLET | Freq: Every day | ORAL | Status: DC
  Administered 2014-01-10 – 2014-01-15 (×6): 0.125 mg via ORAL
  Filled 2014-01-09 (×6): qty 1

## 2014-01-09 MED ORDER — PROPOFOL 10 MG/ML IV BOLUS
INTRAVENOUS | Status: AC
  Filled 2014-01-09: qty 20

## 2014-01-09 MED ORDER — ASPIRIN 81 MG PO CHEW: 324.0000 mg | CHEWABLE_TABLET | Freq: Every day | ORAL | Status: DC

## 2014-01-09 MED ORDER — AMIODARONE HCL IN DEXTROSE 360-4.14 MG/200ML-% IV SOLN
30.0000 mg/h | INTRAVENOUS | Status: DC
  Administered 2014-01-09 – 2014-01-10 (×4): 30 mg/h via INTRAVENOUS
  Filled 2014-01-09 (×8): qty 200

## 2014-01-09 MED ORDER — ARTIFICIAL TEARS OP OINT
TOPICAL_OINTMENT | OPHTHALMIC | Status: DC | PRN
  Administered 2014-01-09: 1 via OPHTHALMIC

## 2014-01-09 MED ORDER — MAGNESIUM SULFATE 4000MG/100ML IJ SOLN
4.0000 g | Freq: Once | INTRAMUSCULAR | Status: AC
  Administered 2014-01-09: 4 g via INTRAVENOUS
  Filled 2014-01-09: qty 100

## 2014-01-09 MED ORDER — MILRINONE IN DEXTROSE 20 MG/100ML IV SOLN
0.2500 ug/kg/min | INTRAVENOUS | Status: DC
  Administered 2014-01-10: 0.25 ug/kg/min via INTRAVENOUS
  Filled 2014-01-09: qty 100

## 2014-01-09 MED ORDER — 0.9 % SODIUM CHLORIDE (POUR BTL) OPTIME
TOPICAL | Status: DC | PRN
  Administered 2014-01-09: 6000 mL

## 2014-01-09 MED ORDER — FAMOTIDINE IN NACL 20-0.9 MG/50ML-% IV SOLN
20.0000 mg | Freq: Two times a day (BID) | INTRAVENOUS | Status: AC
Start: 2014-01-09 — End: 2014-01-10
  Administered 2014-01-09: 20 mg via INTRAVENOUS

## 2014-01-09 MED ORDER — SODIUM CHLORIDE 0.9 % IJ SOLN: 3.0000 mL | INTRAMUSCULAR | Status: DC | PRN

## 2014-01-09 MED ORDER — LACTATED RINGERS IV SOLN
INTRAVENOUS | Status: DC | PRN
  Administered 2014-01-09: 08:00:00 via INTRAVENOUS

## 2014-01-09 MED ORDER — BISACODYL 10 MG RE SUPP: 10.0000 mg | Freq: Every day | RECTAL | Status: DC

## 2014-01-09 MED ORDER — DEXTROSE 5 % IV SOLN
1.5000 g | Freq: Two times a day (BID) | INTRAVENOUS | Status: AC
  Administered 2014-01-09 – 2014-01-11 (×4): 1.5 g via INTRAVENOUS
  Filled 2014-01-09 (×4): qty 1.5

## 2014-01-09 MED ORDER — ACETAMINOPHEN 160 MG/5ML PO SOLN
1000.0000 mg | Freq: Four times a day (QID) | ORAL | Status: DC
  Filled 2014-01-09: qty 40

## 2014-01-09 MED ORDER — DOPAMINE-DEXTROSE 3.2-5 MG/ML-% IV SOLN: 2.5000 ug/kg/min | INTRAVENOUS | Status: DC

## 2014-01-09 MED ORDER — POTASSIUM CHLORIDE 10 MEQ/50ML IV SOLN
10.0000 meq | INTRAVENOUS | Status: AC
  Administered 2014-01-09 (×3): 10 meq via INTRAVENOUS

## 2014-01-09 MED ORDER — PROTAMINE SULFATE 10 MG/ML IV SOLN
INTRAVENOUS | Status: AC
  Filled 2014-01-09: qty 25

## 2014-01-09 MED ORDER — VANCOMYCIN HCL IN DEXTROSE 1-5 GM/200ML-% IV SOLN
1000.0000 mg | Freq: Once | INTRAVENOUS | Status: DC
  Filled 2014-01-09: qty 200

## 2014-01-09 MED ORDER — OXYCODONE HCL 5 MG PO TABS
5.0000 mg | ORAL_TABLET | ORAL | Status: DC | PRN
  Administered 2014-01-10 (×3): 10 mg via ORAL
  Administered 2014-01-10: 5 mg via ORAL
  Filled 2014-01-09: qty 1
  Filled 2014-01-09 (×3): qty 2

## 2014-01-09 MED ORDER — PHENYLEPHRINE HCL 10 MG/ML IJ SOLN
0.0000 ug/min | INTRAVENOUS | Status: DC
  Administered 2014-01-10: 20 ug/min via INTRAVENOUS
  Filled 2014-01-09 (×2): qty 2

## 2014-01-09 MED ORDER — HEMOSTATIC AGENTS (NO CHARGE) OPTIME
TOPICAL | Status: DC | PRN
  Administered 2014-01-09: 1 via TOPICAL

## 2014-01-09 MED ORDER — MIDAZOLAM HCL 5 MG/5ML IJ SOLN
INTRAMUSCULAR | Status: DC | PRN
  Administered 2014-01-09: 3 mg via INTRAVENOUS
  Administered 2014-01-09: 4 mg via INTRAVENOUS
  Administered 2014-01-09: 3 mg via INTRAVENOUS

## 2014-01-09 MED ORDER — MORPHINE SULFATE 2 MG/ML IJ SOLN
2.0000 mg | INTRAMUSCULAR | Status: AC | PRN
  Administered 2014-01-09: 2 mg via INTRAVENOUS
  Administered 2014-01-10: 4 mg via INTRAVENOUS
  Administered 2014-01-10: 2 mg via INTRAVENOUS
  Administered 2014-01-10 – 2014-01-11 (×3): 4 mg via INTRAVENOUS
  Filled 2014-01-09 (×2): qty 2
  Filled 2014-01-09: qty 1
  Filled 2014-01-09: qty 2
  Filled 2014-01-09: qty 1
  Filled 2014-01-09 (×2): qty 2

## 2014-01-09 MED ORDER — METOPROLOL TARTRATE 1 MG/ML IV SOLN: 2.5000 mg | INTRAVENOUS | Status: DC | PRN

## 2014-01-09 MED ORDER — AMIODARONE HCL IN DEXTROSE 360-4.14 MG/200ML-% IV SOLN
INTRAVENOUS | Status: AC
  Filled 2014-01-09: qty 200

## 2014-01-09 MED ORDER — CHLORHEXIDINE GLUCONATE CLOTH 2 % EX PADS
6.0000 | MEDICATED_PAD | Freq: Every day | CUTANEOUS | Status: AC
  Administered 2014-01-09 – 2014-01-13 (×5): 6 via TOPICAL

## 2014-01-09 MED ORDER — LIDOCAINE HCL (CARDIAC) 20 MG/ML IV SOLN
INTRAVENOUS | Status: AC
  Filled 2014-01-09: qty 5

## 2014-01-09 MED ORDER — ALBUMIN HUMAN 5 % IV SOLN
250.0000 mL | INTRAVENOUS | Status: AC | PRN
  Administered 2014-01-09: 250 mL via INTRAVENOUS

## 2014-01-09 MED ORDER — MUPIROCIN 2 % EX OINT
1.0000 "application " | TOPICAL_OINTMENT | Freq: Two times a day (BID) | CUTANEOUS | Status: AC
  Administered 2014-01-10 – 2014-01-14 (×9): 1 via NASAL
  Filled 2014-01-09: qty 22

## 2014-01-09 MED ORDER — MILRINONE IN DEXTROSE 20 MG/100ML IV SOLN
0.2500 ug/kg/min | INTRAVENOUS | Status: DC
  Filled 2014-01-09: qty 100

## 2014-01-09 MED ORDER — ACETAMINOPHEN 500 MG PO TABS
1000.0000 mg | ORAL_TABLET | Freq: Four times a day (QID) | ORAL | Status: AC
  Administered 2014-01-09 – 2014-01-13 (×13): 1000 mg via ORAL
  Filled 2014-01-09 (×23): qty 2

## 2014-01-09 MED ORDER — METOCLOPRAMIDE HCL 5 MG/ML IJ SOLN
10.0000 mg | Freq: Four times a day (QID) | INTRAMUSCULAR | Status: AC
  Administered 2014-01-09 – 2014-01-10 (×3): 10 mg via INTRAVENOUS
  Filled 2014-01-09 (×2): qty 2

## 2014-01-09 MED ORDER — PROTAMINE SULFATE 10 MG/ML IV SOLN
INTRAVENOUS | Status: DC | PRN
  Administered 2014-01-09: 200 mg via INTRAVENOUS

## 2014-01-09 MED ORDER — ROCURONIUM BROMIDE 100 MG/10ML IV SOLN
INTRAVENOUS | Status: DC | PRN
  Administered 2014-01-09: 70 mg via INTRAVENOUS
  Administered 2014-01-09: 30 mg via INTRAVENOUS
  Administered 2014-01-09: 20 mg via INTRAVENOUS

## 2014-01-09 MED ORDER — MIDAZOLAM HCL 2 MG/2ML IJ SOLN
2.0000 mg | INTRAMUSCULAR | Status: DC | PRN
Start: 2014-01-09 — End: 2014-01-12

## 2014-01-09 MED ORDER — PANTOPRAZOLE SODIUM 40 MG PO TBEC
40.0000 mg | DELAYED_RELEASE_TABLET | Freq: Every day | ORAL | Status: DC
  Administered 2014-01-11 – 2014-01-15 (×5): 40 mg via ORAL
  Filled 2014-01-09 (×6): qty 1

## 2014-01-09 MED ORDER — DIGOXIN 0.25 MG/ML IJ SOLN
0.2500 mg | Freq: Every day | INTRAMUSCULAR | Status: AC
  Administered 2014-01-09: 0.25 mg via INTRAVENOUS
  Filled 2014-01-09: qty 1

## 2014-01-09 MED ORDER — NITROGLYCERIN IN D5W 200-5 MCG/ML-% IV SOLN: 0.0000 ug/min | INTRAVENOUS | Status: DC

## 2014-01-09 MED ORDER — INSULIN REGULAR BOLUS VIA INFUSION
0.0000 [IU] | Freq: Three times a day (TID) | INTRAVENOUS | Status: DC
  Filled 2014-01-09: qty 10

## 2014-01-09 MED ORDER — MORPHINE SULFATE 2 MG/ML IJ SOLN
1.0000 mg | INTRAMUSCULAR | Status: AC | PRN
  Administered 2014-01-09: 2 mg via INTRAVENOUS
  Filled 2014-01-09: qty 1

## 2014-01-09 MED ORDER — AMIODARONE LOAD VIA INFUSION
150.0000 mg | Freq: Once | INTRAVENOUS | Status: AC
  Administered 2014-01-09: 150 mg via INTRAVENOUS

## 2014-01-09 MED ORDER — DOCUSATE SODIUM 100 MG PO CAPS
200.0000 mg | ORAL_CAPSULE | Freq: Every day | ORAL | Status: DC
  Administered 2014-01-10 – 2014-01-12 (×3): 200 mg via ORAL
  Filled 2014-01-09 (×4): qty 2

## 2014-01-09 MED ORDER — MILRINONE IN DEXTROSE 20 MG/100ML IV SOLN
INTRAVENOUS | Status: DC | PRN
  Administered 2014-01-09: .3 ug/kg/min via INTRAVENOUS

## 2014-01-09 MED ORDER — VANCOMYCIN HCL IN DEXTROSE 1-5 GM/200ML-% IV SOLN
1000.0000 mg | Freq: Two times a day (BID) | INTRAVENOUS | Status: AC
  Administered 2014-01-09 – 2014-01-10 (×2): 1000 mg via INTRAVENOUS
  Filled 2014-01-09 (×2): qty 200

## 2014-01-09 MED ORDER — LACTATED RINGERS IV SOLN
INTRAVENOUS | Status: DC
  Administered 2014-01-10: 07:00:00 via INTRAVENOUS

## 2014-01-09 MED ORDER — SODIUM CHLORIDE 0.9 % IJ SOLN
INTRAMUSCULAR | Status: DC | PRN
  Administered 2014-01-09 (×4): via TOPICAL

## 2014-01-09 MED ORDER — SODIUM CHLORIDE 0.9 % IV SOLN: INTRAVENOUS | Status: DC

## 2014-01-09 MED ORDER — FENTANYL CITRATE 0.05 MG/ML IJ SOLN
INTRAMUSCULAR | Status: DC | PRN
  Administered 2014-01-09 (×2): 250 ug via INTRAVENOUS
  Administered 2014-01-09: 100 ug via INTRAVENOUS
  Administered 2014-01-09: 500 ug via INTRAVENOUS
  Administered 2014-01-09 (×2): 250 ug via INTRAVENOUS

## 2014-01-09 MED ORDER — ACETAMINOPHEN 160 MG/5ML PO SOLN: 650.0000 mg | Freq: Once | ORAL | Status: AC

## 2014-01-09 MED ORDER — SODIUM CHLORIDE 0.45 % IV SOLN
INTRAVENOUS | Status: DC
  Administered 2014-01-09: 20 mL/h via INTRAVENOUS

## 2014-01-09 MED ORDER — ONDANSETRON HCL 4 MG/2ML IJ SOLN
4.0000 mg | Freq: Four times a day (QID) | INTRAMUSCULAR | Status: DC | PRN
  Administered 2014-01-10 – 2014-01-11 (×3): 4 mg via INTRAVENOUS
  Filled 2014-01-09 (×3): qty 2

## 2014-01-09 MED ORDER — LIDOCAINE HCL (CARDIAC) 20 MG/ML IV SOLN
INTRAVENOUS | Status: DC | PRN
  Administered 2014-01-09: 100 mg via INTRAVENOUS

## 2014-01-09 MED ORDER — METOPROLOL TARTRATE 25 MG/10 ML ORAL SUSPENSION
12.5000 mg | Freq: Two times a day (BID) | ORAL | Status: DC
  Filled 2014-01-09 (×9): qty 5

## 2014-01-09 MED ORDER — PROPOFOL 10 MG/ML IV BOLUS
INTRAVENOUS | Status: DC | PRN
  Administered 2014-01-09: 50 mg via INTRAVENOUS

## 2014-01-09 MED ORDER — METHYLPREDNISOLONE SODIUM SUCC 125 MG IJ SOLR
80.0000 mg | Freq: Once | INTRAMUSCULAR | Status: AC
  Administered 2014-01-09: 80 mg via INTRAVENOUS
  Filled 2014-01-09: qty 1.28

## 2014-01-09 MED ORDER — BISACODYL 5 MG PO TBEC
10.0000 mg | DELAYED_RELEASE_TABLET | Freq: Every day | ORAL | Status: DC
  Administered 2014-01-10 – 2014-01-12 (×3): 10 mg via ORAL
  Filled 2014-01-09 (×3): qty 2

## 2014-01-09 MED ORDER — SODIUM CHLORIDE 0.9 % IV SOLN: 250.0000 mL | INTRAVENOUS | Status: DC

## 2014-01-09 MED ORDER — METOPROLOL TARTRATE 12.5 MG HALF TABLET
12.5000 mg | ORAL_TABLET | Freq: Two times a day (BID) | ORAL | Status: DC
  Administered 2014-01-10 – 2014-01-15 (×11): 12.5 mg via ORAL
  Filled 2014-01-09 (×13): qty 1

## 2014-01-09 MED ORDER — SODIUM CHLORIDE 0.9 % IJ SOLN
3.0000 mL | Freq: Two times a day (BID) | INTRAMUSCULAR | Status: DC
  Administered 2014-01-10 – 2014-01-14 (×8): 3 mL via INTRAVENOUS

## 2014-01-09 MED ORDER — ASPIRIN EC 325 MG PO TBEC
325.0000 mg | DELAYED_RELEASE_TABLET | Freq: Every day | ORAL | Status: DC
  Administered 2014-01-10 – 2014-01-15 (×6): 325 mg via ORAL
  Filled 2014-01-09 (×6): qty 1

## 2014-01-09 MED ORDER — SODIUM CHLORIDE 0.9 % IJ SOLN
INTRAMUSCULAR | Status: AC
  Filled 2014-01-09: qty 10

## 2014-01-09 MED ORDER — ACETAMINOPHEN 650 MG RE SUPP
650.0000 mg | Freq: Once | RECTAL | Status: AC
  Administered 2014-01-09: 650 mg via RECTAL

## 2014-01-09 MED ORDER — POTASSIUM CHLORIDE 10 MEQ/50ML IV SOLN
10.0000 meq | Freq: Once | INTRAVENOUS | Status: AC
  Administered 2014-01-09: 10 meq via INTRAVENOUS

## 2014-01-09 MED ORDER — HEPARIN SODIUM (PORCINE) 1000 UNIT/ML IJ SOLN
INTRAMUSCULAR | Status: DC | PRN
  Administered 2014-01-09: 13000 [IU] via INTRAVENOUS
  Administered 2014-01-09: 2000 [IU] via INTRAVENOUS
  Administered 2014-01-09: 5000 [IU] via INTRAVENOUS

## 2014-01-09 MED ORDER — LACTATED RINGERS IV SOLN: 500.0000 mL | Freq: Once | INTRAVENOUS | Status: DC | PRN

## 2014-01-09 MED ORDER — DEXMEDETOMIDINE HCL IN NACL 200 MCG/50ML IV SOLN
0.1000 ug/kg/h | INTRAVENOUS | Status: DC
  Administered 2014-01-09: 0.7 ug/kg/h via INTRAVENOUS
  Filled 2014-01-09 (×2): qty 50

## 2014-01-09 SURGICAL SUPPLY — 115 items
ADAPTER CARDIO PERF ANTE/RETRO (ADAPTER) ×4 IMPLANT
ADPR PRFSN 84XANTGRD RTRGD (ADAPTER) ×2
APL SKNCLS STERI-STRIP NONHPOA (GAUZE/BANDAGES/DRESSINGS) ×2
ATTRACTOMAT 16X20 MAGNETIC DRP (DRAPES) ×4 IMPLANT
BAG DECANTER FOR FLEXI CONT (MISCELLANEOUS) ×4 IMPLANT
BANDAGE ELASTIC 4 VELCRO ST LF (GAUZE/BANDAGES/DRESSINGS) ×4 IMPLANT
BANDAGE ELASTIC 6 VELCRO ST LF (GAUZE/BANDAGES/DRESSINGS) ×4 IMPLANT
BANDAGE GAUZE ELAST BULKY 4 IN (GAUZE/BANDAGES/DRESSINGS) ×4 IMPLANT
BASKET HEART  (ORDER IN 25'S) (MISCELLANEOUS) ×1
BASKET HEART (ORDER IN 25'S) (MISCELLANEOUS) ×1
BASKET HEART (ORDER IN 25S) (MISCELLANEOUS) ×2 IMPLANT
BENZOIN TINCTURE PRP APPL 2/3 (GAUZE/BANDAGES/DRESSINGS) ×3 IMPLANT
BLADE MINI RND TIP GREEN BEAV (BLADE) ×3 IMPLANT
BLADE STERNUM SYSTEM 6 (BLADE) ×4 IMPLANT
BLADE SURG 11 STRL SS (BLADE) ×3 IMPLANT
BLADE SURG 12 STRL SS (BLADE) ×4 IMPLANT
BLADE SURG ROTATE 9660 (MISCELLANEOUS) ×3 IMPLANT
CANISTER SUCTION 2500CC (MISCELLANEOUS) ×4 IMPLANT
CANNULA GUNDRY RCSP 15FR (MISCELLANEOUS) ×8 IMPLANT
CANNULA VENOUS LOW PROF 32X40 (CANNULA) IMPLANT
CARDIAC SUCTION (MISCELLANEOUS) ×4 IMPLANT
CARDIOBLATE CARDIAC ABLATION (MISCELLANEOUS)
CATH CPB KIT VANTRIGT (MISCELLANEOUS) ×4 IMPLANT
CATH ROBINSON RED A/P 18FR (CATHETERS) ×12 IMPLANT
CATH THORACIC 36FR RT ANG (CATHETERS) ×4 IMPLANT
CLIP TI WIDE RED SMALL 24 (CLIP) IMPLANT
CLOSURE WOUND 1/2 X4 (GAUZE/BANDAGES/DRESSINGS) ×1
CONN 1/2X1/2X1/2  BEN (MISCELLANEOUS) ×2
CONN 1/2X1/2X1/2 BEN (MISCELLANEOUS) ×1 IMPLANT
CONN 3/8X1/2 ST GISH (MISCELLANEOUS) ×6 IMPLANT
COUNTER NEEDLE 20 DBL MAG RED (NEEDLE) ×3 IMPLANT
COVER SURGICAL LIGHT HANDLE (MISCELLANEOUS) ×4 IMPLANT
CRADLE DONUT ADULT HEAD (MISCELLANEOUS) ×4 IMPLANT
DEVICE CARDIOBLATE CARDIAC ABL (MISCELLANEOUS) IMPLANT
DRAIN CHANNEL 32F RND 10.7 FF (WOUND CARE) ×4 IMPLANT
DRAPE CARDIOVASCULAR INCISE (DRAPES) ×4
DRAPE SLUSH/WARMER DISC (DRAPES) ×4 IMPLANT
DRAPE SRG 135X102X78XABS (DRAPES) ×2 IMPLANT
DRSG AQUACEL AG ADV 3.5X14 (GAUZE/BANDAGES/DRESSINGS) ×4 IMPLANT
ELECT BLADE 4.0 EZ CLEAN MEGAD (MISCELLANEOUS) ×4
ELECT BLADE 6.5 EXT (BLADE) ×4 IMPLANT
ELECT CAUTERY BLADE 6.4 (BLADE) ×4 IMPLANT
ELECT REM PT RETURN 9FT ADLT (ELECTROSURGICAL) ×12
ELECTRODE BLDE 4.0 EZ CLN MEGD (MISCELLANEOUS) ×2 IMPLANT
ELECTRODE REM PT RTRN 9FT ADLT (ELECTROSURGICAL) ×5 IMPLANT
GLOVE BIO SURGEON STRL SZ7.5 (GLOVE) ×12 IMPLANT
GLOVE BIOGEL PI IND STRL 6.5 (GLOVE) ×10 IMPLANT
GLOVE BIOGEL PI INDICATOR 6.5 (GLOVE) ×20
GLOVE SURG SS PI 6.5 STRL IVOR (GLOVE) ×3 IMPLANT
GLOVE SURG SS PI 7.5 STRL IVOR (GLOVE) ×3 IMPLANT
GOWN STRL REUS W/ TWL LRG LVL3 (GOWN DISPOSABLE) ×8 IMPLANT
GOWN STRL REUS W/TWL LRG LVL3 (GOWN DISPOSABLE) ×16
HEMOSTAT POWDER SURGIFOAM 1G (HEMOSTASIS) ×15 IMPLANT
HEMOSTAT SURGICEL 2X14 (HEMOSTASIS) ×4 IMPLANT
INSERT FOGARTY XLG (MISCELLANEOUS) IMPLANT
KIT BASIN OR (CUSTOM PROCEDURE TRAY) ×4 IMPLANT
KIT ROOM TURNOVER OR (KITS) ×4 IMPLANT
KIT SUCTION CATH 14FR (SUCTIONS) ×13 IMPLANT
KIT VASOVIEW W/TROCAR VH 2000 (KITS) ×4 IMPLANT
LEAD PACING MYOCARDI (MISCELLANEOUS) ×4 IMPLANT
LINE VENT (MISCELLANEOUS) ×3 IMPLANT
MARKER GRAFT CORONARY BYPASS (MISCELLANEOUS) ×12 IMPLANT
NS IRRIG 1000ML POUR BTL (IV SOLUTION) ×20 IMPLANT
PACK OPEN HEART (CUSTOM PROCEDURE TRAY) ×4 IMPLANT
PAD ARMBOARD 7.5X6 YLW CONV (MISCELLANEOUS) ×8 IMPLANT
PAD ELECT DEFIB RADIOL ZOLL (MISCELLANEOUS) ×4 IMPLANT
PENCIL BUTTON HOLSTER BLD 10FT (ELECTRODE) ×4 IMPLANT
PROBE CRYO2-ABLATION MALLABLE (MISCELLANEOUS) IMPLANT
PUNCH AORTIC ROTATE 4.0MM (MISCELLANEOUS) IMPLANT
PUNCH AORTIC ROTATE 4.5MM 8IN (MISCELLANEOUS) ×3 IMPLANT
PUNCH AORTIC ROTATE 5MM 8IN (MISCELLANEOUS) IMPLANT
SET CARDIOPLEGIA MPS 5001102 (MISCELLANEOUS) ×3 IMPLANT
SPONGE GAUZE 4X4 12PLY (GAUZE/BANDAGES/DRESSINGS) ×8 IMPLANT
SPONGE GAUZE 4X4 12PLY STER LF (GAUZE/BANDAGES/DRESSINGS) ×6 IMPLANT
SPONGE LAP 18X18 X RAY DECT (DISPOSABLE) ×6 IMPLANT
SPONGE LAP 4X18 X RAY DECT (DISPOSABLE) ×3 IMPLANT
STRIP CLOSURE SKIN 1/2X4 (GAUZE/BANDAGES/DRESSINGS) ×2 IMPLANT
SURGIFLO W/THROMBIN 8M KIT (HEMOSTASIS) ×4 IMPLANT
SUT BONE WAX W31G (SUTURE) ×4 IMPLANT
SUT MNCRL AB 4-0 PS2 18 (SUTURE) ×3 IMPLANT
SUT PROLENE 3 0 SH DA (SUTURE) IMPLANT
SUT PROLENE 3 0 SH1 36 (SUTURE) IMPLANT
SUT PROLENE 4 0 RB 1 (SUTURE) ×4
SUT PROLENE 4 0 SH DA (SUTURE) ×4 IMPLANT
SUT PROLENE 4-0 RB1 .5 CRCL 36 (SUTURE) ×2 IMPLANT
SUT PROLENE 5 0 C 1 36 (SUTURE) IMPLANT
SUT PROLENE 6 0 C 1 30 (SUTURE) IMPLANT
SUT PROLENE 6 0 CC (SUTURE) ×12 IMPLANT
SUT PROLENE 8 0 BV175 6 (SUTURE) IMPLANT
SUT PROLENE BLUE 7 0 (SUTURE) ×7 IMPLANT
SUT SILK  1 MH (SUTURE)
SUT SILK 1 MH (SUTURE) IMPLANT
SUT SILK 2 0 SH CR/8 (SUTURE) ×6 IMPLANT
SUT SILK 3 0 SH CR/8 (SUTURE) IMPLANT
SUT STEEL 6MS V (SUTURE) ×11 IMPLANT
SUT STEEL SZ 6 DBL 3X14 BALL (SUTURE) ×7 IMPLANT
SUT VIC AB 1 CTX 36 (SUTURE) ×12
SUT VIC AB 1 CTX36XBRD ANBCTR (SUTURE) ×5 IMPLANT
SUT VIC AB 2-0 CT1 27 (SUTURE) ×4
SUT VIC AB 2-0 CT1 TAPERPNT 27 (SUTURE) ×1 IMPLANT
SUT VIC AB 2-0 CTX 27 (SUTURE) IMPLANT
SUT VIC AB 3-0 X1 27 (SUTURE) IMPLANT
SUTURE E-PAK OPEN HEART (SUTURE) ×4 IMPLANT
SYR 20CC LL (SYRINGE) ×3 IMPLANT
SYS ATRICLIP LAA EXCLUSION 35 (Clip) ×3 IMPLANT
SYS ATRICLIP LAA EXCLUSION 45 (CLIP) IMPLANT
SYSTEM SAHARA CHEST DRAIN ATS (WOUND CARE) ×4 IMPLANT
TAPE PAPER 3X10 WHT MICROPORE (GAUZE/BANDAGES/DRESSINGS) ×4 IMPLANT
TOWEL OR 17X24 6PK STRL BLUE (TOWEL DISPOSABLE) ×8 IMPLANT
TOWEL OR 17X26 10 PK STRL BLUE (TOWEL DISPOSABLE) ×8 IMPLANT
TRAY FOLEY IC TEMP SENS 16FR (CATHETERS) ×1 IMPLANT
TRAY FOLEY METER SIL LF 16FR (CATHETERS) ×6 IMPLANT
TUBING INSUFFLATION 10FT LAP (TUBING) ×4 IMPLANT
UNDERPAD 30X30 INCONTINENT (UNDERPADS AND DIAPERS) ×4 IMPLANT
WATER STERILE IRR 1000ML POUR (IV SOLUTION) ×8 IMPLANT

## 2014-01-09 NOTE — Anesthesia Preprocedure Evaluation (Signed)
Anesthesia Evaluation    Airway       Dental   Pulmonary shortness of breath, asthma , COPDformer smoker,          Cardiovascular hypertension, + Past MI and +CHF     Neuro/Psych PSYCHIATRIC DISORDERS Anxiety Depression    GI/Hepatic negative GI ROS, Neg liver ROS,   Endo/Other  diabetes  Renal/GU negative Renal ROS     Musculoskeletal   Abdominal   Peds  Hematology   Anesthesia Other Findings   Reproductive/Obstetrics                           Anesthesia Physical Anesthesia Plan  ASA: IV  Anesthesia Plan: General   Post-op Pain Management:    Induction: Intravenous  Airway Management Planned: Oral ETT  Additional Equipment: 3D TEE, PA Cath and Arterial line  Intra-op Plan:   Post-operative Plan: Post-operative intubation/ventilation  Informed Consent: I have reviewed the patients History and Physical, chart, labs and discussed the procedure including the risks, benefits and alternatives for the proposed anesthesia with the patient or authorized representative who has indicated his/her understanding and acceptance.   Dental advisory given  Plan Discussed with: CRNA, Anesthesiologist and Surgeon  Anesthesia Plan Comments:         Anesthesia Quick Evaluation

## 2014-01-09 NOTE — Progress Notes (Signed)
Intubated and sedated  BP 76/44  Pulse 80  Temp(Src) 97.3 F (36.3 C) (Oral)  Resp 16  Ht 5\' 7"  (1.702 m)  Wt 138 lb 7.2 oz (62.8 kg)  BMI 21.68 kg/m2  SpO2 98%  CI= 2.0 on dopamine, milrinone and IABP   Intake/Output Summary (Last 24 hours) at 01/09/14 1736 Last data filed at 01/09/14 1600  Gross per 24 hour  Intake 4324.68 ml  Output   4855 ml  Net -530.32 ml   Hct 28  Has faint doppler in right DP, she is moving the foot  Continue present care

## 2014-01-09 NOTE — Anesthesia Procedure Notes (Addendum)
Procedure Name: Intubation Date/Time: 01/09/2014 7:47 AM Performed by: Izora Gala Pre-anesthesia Checklist: Patient identified, Emergency Drugs available, Suction available and Patient being monitored Patient Re-evaluated:Patient Re-evaluated prior to inductionOxygen Delivery Method: Circle system utilized Preoxygenation: Pre-oxygenation with 100% oxygen Intubation Type: IV induction Laryngoscope Size: Miller and 3 Grade View: Grade I Tube type: Oral Tube size: 7.5 mm Number of attempts: 1 Airway Equipment and Method: Stylet Placement Confirmation: ETT inserted through vocal cords under direct vision,  positive ETCO2 and breath sounds checked- equal and bilateral Secured at: 21 cm Tube secured with: Tape Dental Injury: Teeth and Oropharynx as per pre-operative assessment      The patient was identified and consent obtained.  TO was performed, and full barrier precautions were used.  Once the vein was located with the 22 ga. needle using ultrasound guidance , the wire was inserted into the vein.  The wire location was confirmed with ultrasound.  The insertion site was dilated and the introducer was carefully inserted and sutured in place. The PAC was checked, and floated into the PA.  Once in the PA, the catheter was secured. The patient tolerated the procedure well.  CXR was ordered for PACU. Start: [id number] End: [id number] J. Tedra Senegal, MD

## 2014-01-09 NOTE — Progress Notes (Signed)
The patient was examined and preop studies reviewed. There has been no change from the prior exam and the patient is ready for surgery.   Plan CABG and possible Maze on [employer] today

## 2014-01-09 NOTE — Transfer of Care (Signed)
Immediate Anesthesia Transfer of Care Note  Patient: Mary [name]  Procedure(s) Performed: Procedure(s) with comments: CORONARY ARTERY BYPASS GRAFTING (CABG) x 3 using endoscopically harvested right saphenous vein and left internal mammary artery and closure of left atrial appendage (N/A) - patient has preop IA BP  INTRAOPERATIVE TRANSESOPHAGEAL ECHOCARDIOGRAM (N/A)  Patient Location: SICU  Anesthesia Type:General  Level of Consciousness: Patient remains intubated per anesthesia plan  Airway & Oxygen Therapy: Patient remains intubated per anesthesia plan and Patient placed on Ventilator (see vital sign flow sheet for setting)  Post-op Assessment: Report given to PACU RN and Post -op Vital signs reviewed and stable  Post vital signs: Reviewed and stable  Complications: No apparent anesthesia complications

## 2014-01-09 NOTE — OR Nursing (Signed)
13:30 - 2nd call to SICU

## 2014-01-09 NOTE — Procedures (Signed)
Extubation Procedure Note  Patient Details:   Name: Mary [name] DOB: 12-04-1950 MRN: [id number]   Airway Documentation:     Evaluation  O2 sats: stable throughout Complications: No apparent complications Patient did tolerate procedure well. Bilateral Breath Sounds: Clear;Diminished   Yes  NIF -32, VC 703  Hope Pigeon, [place] 01/09/2014, 6:55 PM

## 2014-01-09 NOTE — Brief Op Note (Signed)
01/06/2014 - 01/09/2014  11:46 AM  PATIENT:  Mary [name]  63 y.o. female  PRE-OPERATIVE DIAGNOSIS:  CAD  POST-OPERATIVE DIAGNOSIS:  CAD  PROCEDURE:   CORONARY ARTERY BYPASS GRAFTING x 3 (LIMA-LAD, SVG-OM, SVG-Ramus) ENDOSCOPIC VEIN HARVEST RIGHT THIGH LEFT ATRIAL CLIP  SURGEON:  Surgeon(s): Ivin Poot, MD  ASSISTANT: Suzzanne Cloud, PA-C   ANESTHESIA:   general  PATIENT CONDITION:  ICU - intubated and hemodynamically stable.  PRE-OPERATIVE WEIGHT: 63 kg

## 2014-01-09 NOTE — Anesthesia Postprocedure Evaluation (Signed)
  Anesthesia Post-op Note  Patient: Mary [name]  Procedure(s) Performed: Procedure(s) with comments: CORONARY ARTERY BYPASS GRAFTING (CABG) x 3 using endoscopically harvested right saphenous vein and left internal mammary artery and closure of left atrial appendage (N/A) - patient has preop IA BP  INTRAOPERATIVE TRANSESOPHAGEAL ECHOCARDIOGRAM (N/A)  Patient Location: SICU  Anesthesia Type:General  Level of Consciousness: Patient remains intubated per anesthesia plan  Airway and Oxygen Therapy: Patient placed on Ventilator (see vital sign flow sheet for setting)  Post-op Pain: none  Post-op Assessment: Post-op Vital signs reviewed, Patient's Cardiovascular Status Stable, Respiratory Function Stable and Patent Airway  Post-op Vital Signs: Reviewed and stable  Complications: No apparent anesthesia complications

## 2014-01-09 NOTE — Clinical Documentation Improvement (Signed)
Possible Clinical Conditions?  Severe Malnutrition   Protein Calorie Malnutrition Severe Protein Calorie Malnutrition Other Condition Cannot clinically determine   Risk Factors: Per 01/08/14 RD's evaluation: Patient with inadequate oral intake related to chest pain as evidenced by a 10 pound weight loss over the past 3 months.  Treatment: Ensure Complete po BID  Thank You, Theron Arista, Clinical Documentation Specialist:  [phone]  [employer]

## 2014-01-09 NOTE — Preoperative (Signed)
Beta Blockers   Reason not to administer Beta Blockers:BB this a.m.

## 2014-01-09 NOTE — Progress Notes (Signed)
Patient states that she wants her granddaughter Amber to be the lead in making decisions, not her sister. Advised patient and family (ganddaughter and grandson-in-law) that medical power of attorney would be the best way to accomplish this. Patient is leaving for CABG surgery in the next 30 minutes and not a possibility of getting that done beforehand and patient has already been given presurgical Valium. Offered to write a note on the chart but did let them know without power of attorney it was who is the legal next of kin. Encouraged them to establish medical POA when patient was able to after surgery.

## 2014-01-10 ENCOUNTER — Inpatient Hospital Stay (HOSPITAL_COMMUNITY): Payer: Medicare Other

## 2014-01-10 LAB — POCT I-STAT, CHEM 8
BUN: 11 mg/dL (ref 6–23)
CALCIUM ION: 1.21 mmol/L (ref 1.13–1.30)
Chloride: 97 mEq/L (ref 96–112)
Creatinine, Ser: 0.5 mg/dL (ref 0.50–1.10)
Glucose, Bld: 98 mg/dL (ref 70–99)
HEMATOCRIT: 31 % — AB (ref 36.0–46.0)
HEMOGLOBIN: 10.5 g/dL — AB (ref 12.0–15.0)
Potassium: 4.1 mEq/L (ref 3.7–5.3)
SODIUM: 137 meq/L (ref 137–147)
TCO2: 24 mmol/L (ref 0–100)

## 2014-01-10 LAB — CREATININE, SERUM
Creatinine, Ser: 0.41 mg/dL — ABNORMAL LOW (ref 0.50–1.10)
GFR calc Af Amer: >90 mL/min (ref >90)

## 2014-01-10 LAB — GLUCOSE, CAPILLARY
GLUCOSE-CAPILLARY: 109 mg/dL — AB (ref 70–99)
GLUCOSE-CAPILLARY: 121 mg/dL — AB (ref 70–99)
GLUCOSE-CAPILLARY: 133 mg/dL — AB (ref 70–99)
GLUCOSE-CAPILLARY: 135 mg/dL — AB (ref 70–99)
GLUCOSE-CAPILLARY: 151 mg/dL — AB (ref 70–99)
GLUCOSE-CAPILLARY: 98 mg/dL (ref 70–99)
Glucose-Capillary: 132 mg/dL — ABNORMAL HIGH (ref 70–99)
Glucose-Capillary: 146 mg/dL — ABNORMAL HIGH (ref 70–99)
Glucose-Capillary: 146 mg/dL — ABNORMAL HIGH (ref 70–99)
Glucose-Capillary: 85 mg/dL (ref 70–99)
Glucose-Capillary: 94 mg/dL (ref 70–99)

## 2014-01-10 LAB — MAGNESIUM
MAGNESIUM: 2.5 mg/dL (ref 1.5–2.5)
Magnesium: 2.5 mg/dL (ref 1.5–2.5)

## 2014-01-10 LAB — BASIC METABOLIC PANEL
BUN: 11 mg/dL (ref 6–23)
CO2: 25 meq/L (ref 19–32)
CREATININE: 0.44 mg/dL — AB (ref 0.50–1.10)
Calcium: 7.8 mg/dL — ABNORMAL LOW (ref 8.4–10.5)
Chloride: 103 mEq/L (ref 96–112)
GFR calc Af Amer: >90 mL/min (ref >90)
GFR calc non Af Amer: >90 mL/min (ref >90)
GLUCOSE: 153 mg/dL — AB (ref 70–99)
Potassium: 4.7 mEq/L (ref 3.7–5.3)
Sodium: 139 mEq/L (ref 137–147)

## 2014-01-10 LAB — PREPARE PLATELET PHERESIS: Unit division: 0

## 2014-01-10 LAB — POCT ACTIVATED CLOTTING TIME: Activated Clotting Time: 127 seconds

## 2014-01-10 LAB — CBC
HEMATOCRIT: 27.1 % — AB (ref 36.0–46.0)
HEMATOCRIT: 30.1 % — AB (ref 36.0–46.0)
HEMOGLOBIN: 9 g/dL — AB (ref 12.0–15.0)
Hemoglobin: 9.9 g/dL — ABNORMAL LOW (ref 12.0–15.0)
MCH: 31.5 pg (ref 26.0–34.0)
MCH: 30.7 pg (ref 26.0–34.0)
MCHC: 33.2 g/dL (ref 30.0–36.0)
MCHC: 32.9 g/dL (ref 30.0–36.0)
MCV: 94.8 fL (ref 78.0–100.0)
MCV: 93.2 fL (ref 78.0–100.0)
Platelets: 105 10*3/uL — ABNORMAL LOW (ref 150–400)
Platelets: 113 10*3/uL — ABNORMAL LOW (ref 150–400)
RBC: 2.86 MIL/uL — ABNORMAL LOW (ref 3.87–5.11)
RBC: 3.23 MIL/uL — ABNORMAL LOW (ref 3.87–5.11)
RDW: 14.1 % (ref 11.5–15.5)
RDW: 14 % (ref 11.5–15.5)
WBC: 19.8 10*3/uL — AB (ref 4.0–10.5)
WBC: 28.4 10*3/uL — ABNORMAL HIGH (ref 4.0–10.5)

## 2014-01-10 MED ORDER — INSULIN ASPART 100 UNIT/ML ~~LOC~~ SOLN
0.0000 [IU] | SUBCUTANEOUS | Status: DC
  Administered 2014-01-10: 2 [IU] via SUBCUTANEOUS
  Administered 2014-01-10: 0 [IU] via SUBCUTANEOUS
  Administered 2014-01-11: 2 [IU] via SUBCUTANEOUS

## 2014-01-10 MED ORDER — TRAMADOL HCL 50 MG PO TABS
50.0000 mg | ORAL_TABLET | Freq: Four times a day (QID) | ORAL | Status: DC | PRN
  Administered 2014-01-10 – 2014-01-14 (×11): 50 mg via ORAL
  Filled 2014-01-10 (×11): qty 1

## 2014-01-10 MED ORDER — AMIODARONE IV BOLUS ONLY 150 MG/100ML
150.0000 mg | Freq: Once | INTRAVENOUS | Status: AC
  Administered 2014-01-10: 150 mg via INTRAVENOUS

## 2014-01-10 MED ORDER — INSULIN ASPART 100 UNIT/ML ~~LOC~~ SOLN
3.0000 [IU] | Freq: Three times a day (TID) | SUBCUTANEOUS | Status: DC
  Administered 2014-01-10: 3 [IU] via SUBCUTANEOUS

## 2014-01-10 MED ORDER — INSULIN DETEMIR 100 UNIT/ML ~~LOC~~ SOLN
14.0000 [IU] | Freq: Two times a day (BID) | SUBCUTANEOUS | Status: DC
  Administered 2014-01-10 – 2014-01-11 (×3): 14 [IU] via SUBCUTANEOUS
  Filled 2014-01-10 (×4): qty 0.14

## 2014-01-10 MED ORDER — METHYLPREDNISOLONE SODIUM SUCC 40 MG IJ SOLR
40.0000 mg | Freq: Two times a day (BID) | INTRAMUSCULAR | Status: AC
  Administered 2014-01-10 (×2): 40 mg via INTRAVENOUS
  Filled 2014-01-10 (×2): qty 1

## 2014-01-10 MED ORDER — METOPROLOL TARTRATE 1 MG/ML IV SOLN
2.5000 mg | Freq: Once | INTRAVENOUS | Status: AC
  Administered 2014-01-10: 2.5 mg via INTRAVENOUS

## 2014-01-10 MED ORDER — FUROSEMIDE 10 MG/ML IJ SOLN
20.0000 mg | Freq: Two times a day (BID) | INTRAMUSCULAR | Status: AC
  Administered 2014-01-10 – 2014-01-11 (×4): 20 mg via INTRAVENOUS
  Filled 2014-01-10 (×3): qty 2

## 2014-01-10 MED FILL — Magnesium Sulfate Inj 50%: INTRAMUSCULAR | Qty: 10 | Status: AC

## 2014-01-10 MED FILL — Heparin Sodium (Porcine) Inj 1000 Unit/ML: INTRAMUSCULAR | Qty: 30 | Status: AC

## 2014-01-10 MED FILL — Potassium Chloride Inj 2 mEq/ML: INTRAVENOUS | Qty: 40 | Status: AC

## 2014-01-10 NOTE — Progress Notes (Signed)
UR Completed.  Mary [name] [id number] 01/10/2014

## 2014-01-10 NOTE — Progress Notes (Signed)
1 Day Post-Op Procedure(s) (LRB): CORONARY ARTERY BYPASS GRAFTING (CABG) x 3 using endoscopically harvested right saphenous vein and left internal mammary artery and closure of left atrial appendage (N/A) INTRAOPERATIVE TRANSESOPHAGEAL ECHOCARDIOGRAM (N/A) Subjective: Postop day 1 multivessel CABG for acute MI, preop balloon pump for hypotension, severe LV dysfunction History of COPD and diabetes Patient extubated breathing comfortably Balloon pump 1-3 augmentation-will remove today Minimal chest tube drainage-leave chest tubes intact until she is angled Stable hemodynamics and cardiac output, continue renal dose dopamine Objective: Vital signs in last 24 hours: Temp:  [95.2 F (35.1 C)-98.6 F (37 C)] 97.5 F (36.4 C) (03/18 0900) Pulse Rate:  [29-106] 34 (03/18 0900) Cardiac Rhythm:  [-] Atrial paced (03/18 0800) Resp:  [4-25] 12 (03/18 0900) BP: (76-153)/(42-90) 99/52 mmHg (03/18 0900) SpO2:  [93 %-100 %] 96 % (03/18 0900) Arterial Line BP: (87-152)/(43-77) 119/49 mmHg (03/18 0900) FiO2 (%):  [40 %-50 %] 40 % (03/17 1820) Weight:  [157 lb 10.1 oz (71.5 kg)] 157 lb 10.1 oz (71.5 kg) (03/18 0500)  Hemodynamic parameters for last 24 hours: PAP: (28-49)/(14-29) 41/19 mmHg CO:  [3.1 L/min-4.6 L/min] 4.2 L/min CI:  [1.8 L/min/m2-2.7 L/min/m2] 2.4 L/min/m2  Intake/Output from previous day: 03/17 0701 - 03/18 0700 In: 5918.6 [P.O.:200; I.V.:4236.6; Blood:582; IV [id number] Out: [id number] [Urine:3440; Blood:700; Chest Tube:650] Intake/Output this shift: Total I/O In: 341.8 [I.V.:141.8; IV Piggyback:200] Out: 525 [Urine:500; Chest Tube:25]  Alert and comfortable Lungs with scattered rhonchi Extremities cool Balloon pump in place right femoral artery  Lab Results:  Recent Labs  01/09/14 2000 01/09/14 2009 01/10/14 0420  WBC 21.7*  --  19.8*  HGB 8.9* 8.5* 9.0*  HCT 26.7* 25.0* 27.1*  PLT 128*  --  105*   BMET:  Recent Labs  01/09/14 0359  01/09/14 2009  01/10/14 0420  NA 140  < > 140 139  K 4.2  < > 4.4 4.7  CL 99  --  100 103  CO2 29  --   --  25  GLUCOSE 94  < > 91 153*  BUN 24*  --  13 11  CREATININE 0.58  < > 0.60 0.44*  CALCIUM 9.1  --   --  7.8*  < > = values in this interval not displayed.  PT/INR:  Recent Labs  01/09/14 1300  LABPROT 17.2*  INR 1.44   ABG    Component Value Date/Time   PHART 7.372 01/09/2014 2005   HCO3 29.5* 01/09/2014 2005   TCO2 29 01/09/2014 2009   ACIDBASEDEF 1.0 01/07/2014 1519   O2SAT 94.0 01/09/2014 2005   CBG (last 3)   Recent Labs  01/10/14 0458 01/10/14 0556 01/10/14 0639  GLUCAP 135* 151* 146*    Assessment/Plan: S/P Procedure(s) (LRB): CORONARY ARTERY BYPASS GRAFTING (CABG) x 3 using endoscopically harvested right saphenous vein and left internal mammary artery and closure of left atrial appendage (N/A) INTRAOPERATIVE TRANSESOPHAGEAL ECHOCARDIOGRAM (N/A) Plan Remove balloon pump Mobilize out of bed later today Low-dose Lasix diuresis Continue low dosed dopamine, DC Swan before she is tangled   LOS: 4 days    VAN TRIGT III,PETER 01/10/2014

## 2014-01-10 NOTE — Progress Notes (Signed)
CT surgery p.m. Rounds  Postop day 1 multivessel CABG Preop MI, preoperative balloon pump Hemodynamically stable on low-dose dopamine Patient was out of bed to chair today Evening labs reviewed and are satisfactory, hematocrit 31 potassium 4.1

## 2014-01-10 NOTE — Progress Notes (Signed)
Came to see pt - POD #1 - weaning pressors.  BP more stable.  Progression orders in place per CVTS.  On Amio after brief run of SVT -- now in NSR.    Will be available if needed for assistance.  Leonie Man, MD

## 2014-01-10 NOTE — Progress Notes (Signed)
ACT 127- VO received from MD Prescott Gum to removed IABP. Then pull swan after 4 hours into bedrest if hemodynamics remain stable.   Mary [name]

## 2014-01-10 NOTE — Progress Notes (Signed)
Pt converted to SVT with rates sustaining in the 140's. Pt is asymptomatic and VSS. MD Prescott Gum paged to make aware. TVO received for 2.5 ml IV lopressor and a 150mg  amio bolus.   Will monitor  Mary [name]

## 2014-01-10 NOTE — Progress Notes (Signed)
PT Cancellation Note  Patient Details Name: Mary [name] MRN: [id number] DOB: 14-Feb-1951   Cancelled Treatment:    Reason Eval Not Completed: Medical issues which prohibited therapy. Pt on bedrest x 6 hours (per RN) s/p line removal. Will see 3/19   Ashely Joshua 01/10/2014, 9:36 AM Pager [phone]

## 2014-01-10 NOTE — Progress Notes (Signed)
RFA IABP catheter and sheath removed at [id number] without complications. Pressure to site x 30 min. Site level zero.Right  DP pulse palpable post procedure. Pt teaching done.

## 2014-01-11 ENCOUNTER — Inpatient Hospital Stay (HOSPITAL_COMMUNITY): Payer: Medicare Other

## 2014-01-11 LAB — GLUCOSE, CAPILLARY
GLUCOSE-CAPILLARY: 119 mg/dL — AB (ref 70–99)
GLUCOSE-CAPILLARY: 105 mg/dL — AB (ref 70–99)
GLUCOSE-CAPILLARY: 67 mg/dL — AB (ref 70–99)
GLUCOSE-CAPILLARY: 121 mg/dL — AB (ref 70–99)
GLUCOSE-CAPILLARY: 116 mg/dL — AB (ref 70–99)
Glucose-Capillary: 165 mg/dL — ABNORMAL HIGH (ref 70–99)
Glucose-Capillary: 123 mg/dL — ABNORMAL HIGH (ref 70–99)
Glucose-Capillary: 103 mg/dL — ABNORMAL HIGH (ref 70–99)
Glucose-Capillary: 108 mg/dL — ABNORMAL HIGH (ref 70–99)
Glucose-Capillary: 91 mg/dL (ref 70–99)
Glucose-Capillary: 99 mg/dL (ref 70–99)
Glucose-Capillary: 139 mg/dL — ABNORMAL HIGH (ref 70–99)
Glucose-Capillary: 83 mg/dL (ref 70–99)

## 2014-01-11 LAB — CBC
HCT: 28.9 % — ABNORMAL LOW (ref 36.0–46.0)
Hemoglobin: 9.5 g/dL — ABNORMAL LOW (ref 12.0–15.0)
MCH: 30.7 pg (ref 26.0–34.0)
MCHC: 32.9 g/dL (ref 30.0–36.0)
MCV: 93.5 fL (ref 78.0–100.0)
Platelets: 139 10*3/uL — ABNORMAL LOW (ref 150–400)
RBC: 3.09 MIL/uL — ABNORMAL LOW (ref 3.87–5.11)
RDW: 14.3 % (ref 11.5–15.5)
WBC: 23.7 10*3/uL — ABNORMAL HIGH (ref 4.0–10.5)

## 2014-01-11 LAB — BASIC METABOLIC PANEL
BUN: 11 mg/dL (ref 6–23)
CO2: 25 mEq/L (ref 19–32)
Calcium: 7.9 mg/dL — ABNORMAL LOW (ref 8.4–10.5)
Chloride: 100 mEq/L (ref 96–112)
Creatinine, Ser: 0.32 mg/dL — ABNORMAL LOW (ref 0.50–1.10)
GFR calc Af Amer: >90 mL/min (ref >90)
GFR calc non Af Amer: >90 mL/min (ref >90)
Glucose, Bld: 100 mg/dL — ABNORMAL HIGH (ref 70–99)
Potassium: 4.3 mEq/L (ref 3.7–5.3)
Sodium: 137 mEq/L (ref 137–147)

## 2014-01-11 MED ORDER — ENOXAPARIN SODIUM 30 MG/0.3ML ~~LOC~~ SOLN
30.0000 mg | SUBCUTANEOUS | Status: DC
  Administered 2014-01-11 – 2014-01-14 (×4): 30 mg via SUBCUTANEOUS
  Filled 2014-01-11 (×5): qty 0.3

## 2014-01-11 MED ORDER — SODIUM CHLORIDE 0.9 % IJ SOLN: 3.0000 mL | INTRAMUSCULAR | Status: DC | PRN

## 2014-01-11 MED ORDER — SODIUM CHLORIDE 0.9 % IJ SOLN
3.0000 mL | Freq: Two times a day (BID) | INTRAMUSCULAR | Status: DC
  Administered 2014-01-12 – 2014-01-15 (×6): 3 mL via INTRAVENOUS

## 2014-01-11 MED ORDER — SODIUM CHLORIDE 0.9 % IV SOLN: 250.0000 mL | INTRAVENOUS | Status: DC | PRN

## 2014-01-11 MED ORDER — FUROSEMIDE 40 MG PO TABS
40.0000 mg | ORAL_TABLET | Freq: Every day | ORAL | Status: DC
  Administered 2014-01-12 – 2014-01-15 (×4): 40 mg via ORAL
  Filled 2014-01-11 (×4): qty 1

## 2014-01-11 MED ORDER — INSULIN ASPART 100 UNIT/ML ~~LOC~~ SOLN
0.0000 [IU] | Freq: Three times a day (TID) | SUBCUTANEOUS | Status: DC
  Administered 2014-01-11 – 2014-01-14 (×3): 2 [IU] via SUBCUTANEOUS
  Administered 2014-01-14: 4 [IU] via SUBCUTANEOUS

## 2014-01-11 MED ORDER — ALPRAZOLAM 0.25 MG PO TABS
0.5000 mg | ORAL_TABLET | Freq: Two times a day (BID) | ORAL | Status: DC | PRN
  Administered 2014-01-13 – 2014-01-14 (×4): 0.5 mg via ORAL
  Filled 2014-01-11 (×5): qty 2

## 2014-01-11 MED ORDER — MORPHINE SULFATE 4 MG/ML IJ SOLN
4.0000 mg | Freq: Once | INTRAMUSCULAR | Status: AC
  Administered 2014-01-11: 4 mg via INTRAVENOUS

## 2014-01-11 MED ORDER — PROMETHAZINE HCL 25 MG/ML IJ SOLN: 6.2500 mg | Freq: Three times a day (TID) | INTRAMUSCULAR | Status: DC | PRN

## 2014-01-11 MED ORDER — FUROSEMIDE 10 MG/ML IJ SOLN: 20.0000 mg | Freq: Two times a day (BID) | INTRAMUSCULAR | Status: DC

## 2014-01-11 MED ORDER — METOCLOPRAMIDE HCL 5 MG/ML IJ SOLN
10.0000 mg | Freq: Four times a day (QID) | INTRAMUSCULAR | Status: AC
  Administered 2014-01-11 – 2014-01-13 (×10): 10 mg via INTRAVENOUS
  Filled 2014-01-11 (×11): qty 2

## 2014-01-11 MED ORDER — AMIODARONE HCL 200 MG PO TABS
200.0000 mg | ORAL_TABLET | Freq: Two times a day (BID) | ORAL | Status: DC
Start: 2014-01-11 — End: 2014-01-11
  Administered 2014-01-11: 200 mg via ORAL
  Filled 2014-01-11 (×2): qty 1

## 2014-01-11 MED ORDER — INSULIN DETEMIR 100 UNIT/ML ~~LOC~~ SOLN
14.0000 [IU] | Freq: Every day | SUBCUTANEOUS | Status: DC
  Filled 2014-01-11: qty 0.14

## 2014-01-11 MED ORDER — MOVING RIGHT ALONG BOOK
Freq: Once | Status: AC
  Administered 2014-01-11: 09:00:00
  Filled 2014-01-11: qty 1

## 2014-01-11 MED ORDER — FE FUMARATE-B12-VIT C-FA-IFC PO CAPS
1.0000 | ORAL_CAPSULE | Freq: Every morning | ORAL | Status: DC
  Administered 2014-01-12 – 2014-01-15 (×4): 1 via ORAL
  Filled 2014-01-11 (×4): qty 1

## 2014-01-11 MED ORDER — FUROSEMIDE 40 MG PO TABS: 40.0000 mg | ORAL_TABLET | Freq: Every day | ORAL | Status: DC

## 2014-01-11 NOTE — Progress Notes (Addendum)
2 Days Post-Op Procedure(s) (LRB): CORONARY ARTERY BYPASS GRAFTING (CABG) x 3 using endoscopically harvested right saphenous vein and left internal mammary artery and closure of left atrial appendage (N/A) INTRAOPERATIVE TRANSESOPHAGEAL ECHOCARDIOGRAM (N/A) Subjective: CABG for SEMI, EF .30 preop IABP Hx COPD, DM LBP, PVD Postop afib>> NSR w/ amio  Objective: Vital signs in last 24 hours: Temp:  [97.2 F (36.2 C)-98.4 F (36.9 C)] 97.7 F (36.5 C) (03/19 0715) Pulse Rate:  [89-140] 90 (03/19 0900) Cardiac Rhythm:  [-] Atrial paced (03/19 0730) Resp:  [0-24] 16 (03/19 0900) BP: (86-125)/(50-94) 96/58 mmHg (03/19 0900) SpO2:  [92 %-100 %] 100 % (03/19 0900) Arterial Line BP: (95-142)/(38-66) 142/53 mmHg (03/19 0900) Weight:  [155 lb 13.8 oz (70.7 kg)] 155 lb 13.8 oz (70.7 kg) (03/19 0500)  Hemodynamic parameters for last 24 hours: PAP: (31-49)/(10-26) 31/12 mmHg CO:  [3.6 L/min] 3.6 L/min CI:  [2.1 L/min/m2] 2.1 L/min/m2  Intake/Output from previous day: 03/18 0701 - 03/19 0700 In: 1621.8 [P.O.:180; I.V.:1141.8; IV Piggyback:300] Out: 2710 [id number]; Chest Tube:225] Intake/Output this shift: Total I/O In: 79.2 [I.V.:79.2] Out: 570 [Urine:530; Chest Tube:40]  EXAM  Alert and comfortable Mild edema  Lab Results:  Recent Labs  01/10/14 1700 01/10/14 1714 01/11/14 0210  WBC 28.4*  --  23.7*  HGB 9.9* 10.5* 9.5*  HCT 30.1* 31.0* 28.9*  PLT 113*  --  139*   BMET:  Recent Labs  01/10/14 0420  01/10/14 1714 01/11/14 0210  NA 139  --  137 137  K 4.7  --  4.1 4.3  CL 103  --  97 100  CO2 25  --   --  25  GLUCOSE 153*  --  98 100*  BUN 11  --  11 11  CREATININE 0.44*  < > 0.50 0.32*  CALCIUM 7.8*  --   --  7.9*  < > = values in this interval not displayed.  PT/INR:  Recent Labs  01/09/14 1300  LABPROT 17.2*  INR 1.44   ABG    Component Value Date/Time   PHART 7.372 01/09/2014 2005   HCO3 29.5* 01/09/2014 2005   TCO2 24 01/10/2014 1714   ACIDBASEDEF  1.0 01/07/2014 1519   O2SAT 94.0 01/09/2014 2005   CBG (last 3)   Recent Labs  01/10/14 2344 01/11/14 0335 01/11/14 0712  GLUCAP 132* 91 99    Assessment/Plan: S/P Procedure(s) (LRB): CORONARY ARTERY BYPASS GRAFTING (CABG) x 3 using endoscopically harvested right saphenous vein and left internal mammary artery and closure of left atrial appendage (N/A) INTRAOPERATIVE TRANSESOPHAGEAL ECHOCARDIOGRAM (N/A) tx to stepdown Diuresis Stop amio because of nausea Expected blood loss anemia- start po Fe  LOS: 5 days    VAN TRIGT III,Jaelee Laughter 01/11/2014

## 2014-01-11 NOTE — OR Nursing (Signed)
Late entry: Patient allergic to latex, MD notified.  MD decided to wear latex gloves for the surgery.

## 2014-01-11 NOTE — Progress Notes (Signed)
Hypoglycemic Event  CBG: 67  Treatment: 15 GM carbohydrate snack  Symptoms: Sweaty and Shaky  Follow-up CBG: Time:1808 CBG Result:83  Possible Reasons for Event: Inadequate meal intake  Comments/MD notified:Van Trigt    Dominyck Reser L  Remember to initiate Hypoglycemia Order Set & complete

## 2014-01-11 NOTE — Progress Notes (Signed)
Patient ID: Mary [name], female   DOB: Jul 02, 1951, 63 y.o.   MRN: [id number] EVENING ROUNDS NOTE :     [address]       [place] [address]             [phone]                 2 Days Post-Op Procedure(s) (LRB): CORONARY ARTERY BYPASS GRAFTING (CABG) x 3 using endoscopically harvested right saphenous vein and left internal mammary artery and closure of left atrial appendage (N/A) INTRAOPERATIVE TRANSESOPHAGEAL ECHOCARDIOGRAM (N/A)  Total Length of Stay:  LOS: 5 days  BP 102/55  Pulse 88  Temp(Src) 98.3 F (36.8 C) (Oral)  Resp 17  Ht 5\' 7"  (1.702 m)  Wt 155 lb 13.8 oz (70.7 kg)  BMI 24.41 kg/m2  SpO2 95%  .Intake/Output     03/19 0701 - 03/20 0700   P.O.    I.V. (mL/kg) 347.5 (4.9)   IV Piggyback    Total Intake(mL/kg) 347.5 (4.9)   Urine (mL/kg/hr) 923 (1)   Chest Tube 130 (0.1)   Total Output 1053   Net -705.5         . sodium chloride 20 mL/hr (01/09/14 1427)  . sodium chloride 20 mL/hr at 01/09/14 1500  . sodium chloride    . lactated ringers 20 mL/hr at 01/11/14 0400     Lab Results  Component Value Date   WBC 23.7* 01/11/2014   HGB 9.5* 01/11/2014   HCT 28.9* 01/11/2014   PLT 139* 01/11/2014   GLUCOSE 100* 01/11/2014   CHOL 136 09/29/2013   TRIG 152* 09/29/2013   HDL 46 09/29/2013   LDLDIRECT 66 04/14/2012   LDLCALC 60 09/29/2013   ALT 17 01/08/2014   AST 52* 01/08/2014   NA 137 01/11/2014   K 4.3 01/11/2014   CL 100 01/11/2014   CREATININE 0.32* 01/11/2014   BUN 11 01/11/2014   CO2 25 01/11/2014   TSH 0.069* 01/08/2014   INR 1.44 01/09/2014   HGBA1C 7.3* 01/08/2014   MICROALBUR 0.50 05/10/2013   Wbc elevated Reduced dose of insulin   Grace Isaac MD  Beeper [phone] Office [id number] 01/11/2014 7:43 PM

## 2014-01-11 NOTE — Progress Notes (Signed)
Inpatient Diabetes Program Recommendations  AACE/ADA: New Consensus Statement on Inpatient Glycemic Control (2013)  Target Ranges:  Prepandial:   less than 140 mg/dL      Peak postprandial:   less than 180 mg/dL (1-2 hours)      Critically ill patients:  140 - 180 mg/dL   Reason for Visit: Results for CLIDA, [place] (MRN [id number]) as of 01/11/2014 10:54  Ref. Range 01/10/2014 23:44 01/11/2014 03:35 01/11/2014 07:12  Glucose-Capillary Latest Range: 70-99 mg/dL 132 (H) 91 99   Consider reducing Levemir to 14 units daily.  Thanks, Adah Perl, RN, BC-ADM Inpatient Diabetes Coordinator Pager [phone]

## 2014-01-11 NOTE — Progress Notes (Signed)
Pt transferred to 2w rm24. Vitals B/P 110/68(77), Temp 98, RR 18,hr 77, sat 98% on RA.

## 2014-01-11 NOTE — Op Note (Signed)
[name], [name] NO.:  [id number]  MEDICAL RECORD NO.:  [id number]  LOCATION:                                 FACILITY:  PHYSICIAN:  Ivin Poot, M.D.  DATE OF BIRTH:  11-Jul-1951  DATE OF PROCEDURE:  01/09/2014 DATE OF DISCHARGE:                              OPERATIVE REPORT   PROCEDURE: 1. Coronary artery bypass grafting x3 (left internal mammary artery to     LAD, saphenous vein graft to ramus intermediate, saphenous vein     graft to circumflex marginal). 2. Application of left atrial clip. 3. Endoscopic harvest of right leg greater saphenous vein.  PREOPERATIVE DIAGNOSIS:  Severe 3-vessel coronary artery disease, ischemic cardiomyopathy with EF of 30%, history of paroxysmal atrial fibrillation for excess of 10 years.  POSTOPERATIVE DIAGNOSIS:  Severe 3-vessel coronary artery disease, ischemic cardiomyopathy with EF of 30%, history of paroxysmal atrial fibrillation for excess of 10 years.  SURGEON:  Ivin Poot, M.D.  ASSISTANT:  Suzzanne Cloud, PA-C.  ANESTHESIA:  General by Dr. Tedra Senegal.  INDICATIONS:  The patient is a 63 year old Caucasian female smoker with COPD, hypertension, and diabetes, who presented with chest pain and shortness of breath and positive cardiac enzymes.  She was transferred to this facility and underwent echocardiogram, which demonstrated EF of 30% with anterior and inferior apical hypokinesia.  She was in atrial fibrillation rhythm on presentation and was started on Cardizem and heparin.  She was taken to the cardiac cath lab because of her cardiac enzymes being positive and was found to have left main and multivessel CAD with a small nondominant right coronary.  A balloon pump was placed for low blood pressure.  An echocardiogram revealed no significant MR, TR, or aortic stenosis.  She was stabilized and allowed to recover from her MI.  Her white count was elevated at presentation and gradually declined  towards normal and cultures were negative, so she was prepared for surgery on January 09, 2014.  Prior to surgery, I discussed the full details of coronary artery bypass grafting with the patient.  I discussed the location of the surgical incisions, the use of general anesthesia and cardiopulmonary bypass, and the expected postoperative hospital recovery.  I reviewed with the patient the risks to her or coronary artery bypass surgery including risks of MI, stroke, bleeding, pleural effusions, postoperative arrhythmias, infection, and death.  After reviewing these issues, she demonstrated her understanding and agreed to proceed with surgery under what I felt was an informed consent.  OPERATIVE FINDINGS: 1. Small mammary artery, but with good flow, endoscopic vein harvest,     vein good quality. 2. Small coronary targets. 3. Scarring of the distal anterior wall and distal inferior wall with     fairly well-preserved myocardium at the base of the heart without     significant MR.  OPERATIVE PROCEDURE:  The patient was brought to the operating room and placed supine on the operating table where general anesthesia was induced.  A transesophageal echo probe was placed by the anesthesiologist.  The patient was prepped and draped as a sterile field.  The balloon pump was prepped out of  the sterile field.  A proper time-out was performed.  A sternal incision was made and the saphenous vein was harvested endoscopically from the right leg.  The left internal mammary artery was harvested as a pedicle graft from its origin at the subclavian vessels. It was a small vessel, but with good flow.  The sternal retractor was placed and the pericardium was opened and suspended.  Heparin was administered and pursestrings were placed in the ascending aorta and right atrium.  After the ACT was documented as being therapeutic, the patient was cannulated and placed on cardiopulmonary bypass.  The coronary  arteries were identified for grafting and the mammary artery and vein grafts were prepared for the distal anastomoses.  Cardioplegia cannulas were placed for both antegrade and retrograde cold blood cardioplegia.  The patient was cooled 32 degrees and aortic cross-clamp was applied.  One liter of cold blood cardioplegia was delivered in split doses between the antegrade aortic and retrograde coronary sinus catheters.  There was good cardioplegic arrest and septal temperature dropped less than 12 degrees.  Cardioplegia was delivered every 20 minutes or less while the crossclamp was in place.  The distal coronary anastomoses were then performed.  The first distal anastomosis was to the OM branch of the circumflex.  This was totally occluded.  It was 1.5-mm vessel.  A reverse saphenous vein was sewn end- to-side with running 7-0 Prolene with excellent flow through the graft. Cardioplegia was redosed.  The 2nd distal anastomosis was the ramus intermediate branch of left coronary.  This was a 1.4-mm vessel proximal 80% stenosis.  A reverse saphenous vein was sewn end-to-side with running 7-0 Prolene with good flow through the graft.  Cardioplegia was redosed.  Third distal anastomosis was to the distal third of the LAD.  The LAD more proximally was deeply intramyocardial.  It had a proximal 95% stenosis.  The vessel was 1.4 mm in diameter.  The left IMA pedicle was brought through an opening created in the left lateral pericardium, and was brought down onto the LAD and sewn end-to-side with running 8-0 Prolene.  There was good flow through the anastomosis after briefly releasing the pedicle bulldog on the mammary artery.  The bulldog was reapplied and the pedicle was secured epicardium.  Cardioplegia was redosed.  The peripheral crossclamp was still in place, 2 proximal vein anastomoses were performed on the ascending aorta using a 4.5 mm punch running 6-0 Prolene.  Prior to tying down the  final proximal anastomosis, air was vented from the coronaries with a dose of retrograde warm blood cardioplegia.  Prior to releasing the cross-clamp, a 3.5 cm atrial clip was placed to occlude the left atrial appendage completely.  The crossclamp was then removed.  The heart resumed a spontaneous rhythm.  Air was aspirated from the vein grafts and each for open and had good flow.  Hemostasis was documented at the proximal distal anastomoses.  The patient was rewarmed to 37 degrees.  Temporary pacing wires were applied.  The lungs were expanded and the ventilator was resumed.  When the patient was adequately reperfused, she was weaned off cardiopulmonary bypass on low-dose dopamine and milrinone. Echocardiogram showed improved global LV function.  Protamine was administered without adverse reaction.  The cannulas were removed.  The mediastinum was irrigated with warm saline.  The superior pericardial fat was closed over the aorta.  Anterior mediastinal and left pleural chest tube were placed and brought out through separate incisions.  The sternum was closed with  wire.  The balloon pump remained augmented one-to-one.  The pectoralis fascia was closed in running #1 Vicryl.  The subcutaneous layer and skin were closed in running Vicryl. Sterile dressings were applied.  Total cardiopulmonary bypass time was 103 minutes.  The patient returned to the ICU in critical, but stable condition.     Ivin Poot, M.D.     PV/MEDQ  D:  01/10/2014  T:  01/10/2014  Job:  [id number]  cc:   Shelva Majestic, M.D.

## 2014-01-11 NOTE — Plan of Care (Signed)
Problem: Phase I - Pre-Op Goal: Point person for discharge identified Outcome: Completed/Met Date Met:  01/11/14 Sister will be able to stay with patient upon discharge

## 2014-01-11 NOTE — Evaluation (Signed)
Physical Therapy Evaluation Patient Details Name: Mary [name] MRN: [id number] DOB: 14-Sep-1951 Today's Date: 01/11/2014 Time: [id number] PT Time Calculation (min): 32 min  PT Assessment / Plan / Recommendation History of Present Illness  s/p Cabg x3  Clinical Impression  Pt admitted with/for CABG.  Pt currently limited functionally due to the problems listed below.  (see problems list.)  Pt will benefit from PT to maximize function and safety to be able to get home safely with available assist of family.     PT Assessment       Follow Up Recommendations  Home health PT;Other (comment) (may not progress enough to get directly home and need ST SNF)    Does the patient have the potential to tolerate intense rehabilitation      Barriers to Discharge        Equipment Recommendations  None recommended by PT    Recommendations for Other Services OT consult   Frequency      Precautions / Restrictions Precautions Precautions: Fall;Sternal   Pertinent Vitals/Pain VSS with HR in 80's and sats in mid to upper 90's      Mobility  Bed Mobility Overal bed mobility: Needs Assistance Bed Mobility: Supine to Sit Supine to sit: HOB elevated;Mod assist General bed mobility comments: reinforced sternal precautions; needed cues and truncal assist Transfers Overall transfer level: Needs assistance Transfers: Sit to/from Stand;Stand Pivot Transfers Sit to Stand: Mod assist Stand pivot transfers: Mod assist General transfer comment: cued for sequencing and steadying assist    Exercises     PT Diagnosis:    PT Problem List:   PT Treatment Interventions:       PT Goals(Current goals can be found in the care plan section) Acute Rehab PT Goals Patient Stated Goal: Get back home PT Goal Formulation: With patient Time For Goal Achievement: 01/25/14 Potential to Achieve Goals: Good  Visit Information  Last PT Received On: 01/11/14 Assistance Needed: +2 History of Present Illness:  s/p Cabg x3       Prior [place] expects to be discharged to:: Private residence Living Arrangements: Other relatives Available Help at Discharge: Family (sister coming to live with her while needed) Type of Home: House Home Access: Stairs to enter [employer] of Steps: several Entrance Stairs-Rails: [place]: One [place]: Environmental consultant - 2 wheels;Bedside commode;Shower seat Prior Function Level of Independence: Independent with assistive device(s) Communication Communication: No difficulties    Cognition  Cognition Arousal/Alertness: Awake/alert Behavior During Therapy: WFL for tasks assessed/performed Overall Cognitive Status: Within Functional Limits for tasks assessed    Extremity/Trunk Assessment Upper Extremity Assessment Upper Extremity Assessment: Defer to OT evaluation Lower Extremity Assessment Lower Extremity Assessment: Generalized weakness   Balance Balance Overall balance assessment: Needs assistance Sitting-balance support: No upper extremity supported Sitting balance-Leahy Scale: Fair Standing balance support: Bilateral upper extremity supported Standing balance-Leahy Scale: Poor  End of Session PT - End of Session Equipment Utilized During Treatment: Oxygen Activity Tolerance: Patient tolerated treatment well Patient left: in chair;with call bell/phone within reach Nurse Communication: Mobility status  GP     Brigette Hopfer, [name] 01/11/2014, 11:39 AM 01/11/2014  [name], PT [phone]  (pager)

## 2014-01-11 NOTE — Clinical Documentation Improvement (Signed)
01/11/14  Dear Dr.Van Kerby Less Rolley Sims  Possible Clinical Conditions?    Expected Acute Blood Loss Anemia  Acute Blood Loss Anemia  Acute on chronic blood loss anemia  Precipitous drop in Hematocrit  Other Condition  Cannot Clinically Determine     Risk Factors:  Patient with anemia per 3/15 progress notes. EBL of 700 ml per 3/17 Anesthesia record.  Diagnostics: H&H on 3/14:  14.1/42.5 H&H on 3/17:  8.2/24.0  IV fluids / plasma expanders: Per 3/17 Anesthesia record: Cell saver: 331 ml. Platelets,pheresed: 251 ml. LR: 2300 ml. 0.9% NaCl: 150 ml. 3/17:  Order for Albumin 5%. Medications (Fe, Procrit)  Thank Sherian Maroon Documentation Specialist [place] [employer]

## 2014-01-12 ENCOUNTER — Inpatient Hospital Stay (HOSPITAL_COMMUNITY): Payer: Medicare Other

## 2014-01-12 ENCOUNTER — Encounter (HOSPITAL_COMMUNITY): Payer: Self-pay | Admitting: Cardiothoracic Surgery

## 2014-01-12 LAB — TYPE AND SCREEN
ABO/RH(D): O POS
Antibody Screen: NEGATIVE
Unit division: 0
Unit division: 0
Unit division: 0
Unit division: 0

## 2014-01-12 LAB — GLUCOSE, CAPILLARY
GLUCOSE-CAPILLARY: 96 mg/dL (ref 70–99)
GLUCOSE-CAPILLARY: 56 mg/dL — AB (ref 70–99)
GLUCOSE-CAPILLARY: 135 mg/dL — AB (ref 70–99)
Glucose-Capillary: 65 mg/dL — ABNORMAL LOW (ref 70–99)
Glucose-Capillary: 95 mg/dL (ref 70–99)
Glucose-Capillary: 101 mg/dL — ABNORMAL HIGH (ref 70–99)
Glucose-Capillary: 96 mg/dL (ref 70–99)

## 2014-01-12 LAB — CBC
HCT: 29.4 % — ABNORMAL LOW (ref 36.0–46.0)
Hemoglobin: 9.8 g/dL — ABNORMAL LOW (ref 12.0–15.0)
MCH: 31.2 pg (ref 26.0–34.0)
MCHC: 33.3 g/dL (ref 30.0–36.0)
MCV: 93.6 fL (ref 78.0–100.0)
Platelets: 129 10*3/uL — ABNORMAL LOW (ref 150–400)
RBC: 3.14 MIL/uL — ABNORMAL LOW (ref 3.87–5.11)
RDW: 14.6 % (ref 11.5–15.5)
WBC: 26.1 10*3/uL — ABNORMAL HIGH (ref 4.0–10.5)

## 2014-01-12 LAB — BASIC METABOLIC PANEL
BUN: 20 mg/dL (ref 6–23)
CO2: 30 mEq/L (ref 19–32)
Calcium: 8.9 mg/dL (ref 8.4–10.5)
Chloride: 99 mEq/L (ref 96–112)
Creatinine, Ser: 0.52 mg/dL (ref 0.50–1.10)
GFR calc Af Amer: >90 mL/min (ref >90)
GFR calc non Af Amer: >90 mL/min (ref >90)
Glucose, Bld: 83 mg/dL (ref 70–99)
Potassium: 3.7 mEq/L (ref 3.7–5.3)
Sodium: 140 mEq/L (ref 137–147)

## 2014-01-12 MED ORDER — INSULIN DETEMIR 100 UNIT/ML ~~LOC~~ SOLN
10.0000 [IU] | Freq: Every day | SUBCUTANEOUS | Status: DC
  Administered 2014-01-12: 10 [IU] via SUBCUTANEOUS
  Filled 2014-01-12 (×2): qty 0.1

## 2014-01-12 NOTE — Progress Notes (Signed)
CARDIAC REHAB PHASE I   PRE:  Rate/Rhythm: 84 SR  BP:  Supine:   Sitting: 141/74  Standing:    SaO2: 96%RA  MODE:  Ambulation: 150 ft   POST:  Rate/Rhythm: 89  BP:  Supine: 136/71  Sitting:   Standing:    SaO2: 98%RA 1500-1530 Pt requesting to walk. Walked 150 ft on RA with rollator and sat once to rest. Generalized weakness. To bed after walk. Tolerated well. Asst x 1.   Graylon Good, RN BSN  01/12/2014 3:25 PM

## 2014-01-12 NOTE — Progress Notes (Signed)
HYPOGLYCEMIC EVENT: CBG 56 Treatment with apple juice and bedtime snack Re-check CBG was 135 Symptomatic, patient was cold and clammy Reason for event could include lack of adequate meal intake  Randell Patient

## 2014-01-12 NOTE — Progress Notes (Signed)
Physical Therapy Treatment Patient Details Name: Mary [name]: [id number] DOB: 1951-09-19 Today's Date: 01/12/2014 Time: [id number] PT Time Calculation (min): 17 min  PT Assessment / Plan / Recommendation  History of Present Illness s/p Cabg x3   PT Comments   Improving with pain, following precautions with minor cuing  Follow Up Recommendations  Home health PT;Other (comment)     Does the patient have the potential to tolerate intense rehabilitation     Barriers to Discharge        Equipment Recommendations       Recommendations for Other Services    Frequency Min 3X/week   Progress towards PT Goals Progress towards PT goals: Progressing toward goals  Plan Current plan remains appropriate    Precautions / Restrictions Precautions Precautions: Fall;Sternal Restrictions Weight Bearing Restrictions: No   Pertinent Vitals/Pain VSS    Mobility  Transfers Overall transfer level: Needs assistance Equipment used: Rolling walker (2 wheeled) Transfers: Sit to/from Stand Sit to Stand: Min assist General transfer comment: cued for sequencing and steadying assist  sternal precaution reinforcement Ambulation/Gait Ambulation/Gait assistance: Min assist Ambulation Distance (Feet): 150 Feet Assistive device: Rolling walker (2 wheeled) Gait Pattern/deviations: Step-through pattern;Wide base of support Gait velocity: slowed Gait velocity interpretation: Below normal speed for age/gender    Exercises     PT Diagnosis:    PT Problem List:   PT Treatment Interventions:     PT Goals (current goals can now be found in the care plan section) Acute Rehab PT Goals Patient Stated Goal: Get back home PT Goal Formulation: With patient Time For Goal Achievement: 01/25/14 Potential to Achieve Goals: Good  Visit Information  Last PT Received On: 01/12/14 Assistance Needed: +1 History of Present Illness: s/p Cabg x3    Subjective Data  Patient Stated Goal: Get back home    Cognition  Cognition Arousal/Alertness: Awake/alert Behavior During Therapy: WFL for tasks assessed/performed Overall Cognitive Status: Within Functional Limits for tasks assessed    Balance  Balance Overall balance assessment: Needs assistance Sitting-balance support: Feet supported;No upper extremity supported Sitting balance-Leahy Scale: Good Standing balance support: Bilateral upper extremity supported Standing balance-Leahy Scale: Poor  End of Session PT - End of Session Equipment Utilized During Treatment: Oxygen Activity Tolerance: Patient tolerated treatment well Patient left: in chair;with call bell/phone within reach Nurse Communication: Mobility status   GP     Mary [name], Mary [name] 01/12/2014, 3:27 PM 01/12/2014  Mary [name], [place] [name]  (pager)

## 2014-01-12 NOTE — Progress Notes (Signed)
D/C Central line per order. Pressure held. Occlusive dsg applied. Pt instructed of bedrest for 30 mins. Verbalized understanding. Will continue to monitor pt closely.

## 2014-01-12 NOTE — Progress Notes (Addendum)
      [address]       [place] [address]             [phone]      3 Days Post-Op Procedure(s) (LRB): CORONARY ARTERY BYPASS GRAFTING (CABG) x 3 using endoscopically harvested right saphenous vein and left internal mammary artery and closure of left atrial appendage (N/A) INTRAOPERATIVE TRANSESOPHAGEAL ECHOCARDIOGRAM (N/A)  Subjective:  Ms. [name] complains of pain with moving around this morning. Needs to ambulate, No BM  Objective: Vital signs in last 24 hours: Temp:  [97.4 F (36.3 C)-98.3 F (36.8 C)] 98.2 F (36.8 C) (03/20 0628) Pulse Rate:  [74-91] 78 (03/20 0628) Cardiac Rhythm:  [-] Normal sinus rhythm (03/19 2130) Resp:  [12-24] 18 (03/20 0628) BP: (96-133)/(52-79) 119/66 mmHg (03/20 0628) SpO2:  [93 %-100 %] 93 % (03/20 0628) Arterial Line BP: (99-150)/(43-118) 125/59 mmHg (03/19 1430) Weight:  [148 lb 5.9 oz (67.3 kg)-152 lb 8.9 oz (69.2 kg)] 148 lb 5.9 oz (67.3 kg) (03/20 0628)  Intake/Output from previous day: 03/19 0701 - 03/20 0700 In: 347.5 [I.V.:347.5] Out: 1178 [Urine:1048; Chest Tube:130]  General appearance: alert, cooperative and no distress Heart: regular rate and rhythm Lungs: clear to auscultation bilaterally Abdomen: soft, non-tender; bowel sounds normal; no masses,  no organomegaly Extremities: edema trace Wound: clean and dry  Lab Results:  Recent Labs  01/11/14 0210 01/12/14 0416  WBC 23.7* 26.1*  HGB 9.5* 9.8*  HCT 28.9* 29.4*  PLT 139* 129*   BMET:  Recent Labs  01/11/14 0210 01/12/14 0416  NA 137 140  K 4.3 3.7  CL 100 99  CO2 25 30  GLUCOSE 100* 83  BUN 11 20  CREATININE 0.32* 0.52  CALCIUM 7.9* 8.9    PT/INR:  Recent Labs  01/09/14 1300  LABPROT 17.2*  INR 1.44   ABG    Component Value Date/Time   PHART 7.372 01/09/2014 2005   HCO3 29.5* 01/09/2014 2005   TCO2 24 01/10/2014 1714   ACIDBASEDEF 1.0 01/07/2014 1519   O2SAT 94.0 01/09/2014 2005   CBG (last 3)   Recent Labs   01/11/14 1943 01/11/14 2233 01/12/14 0641  GLUCAP 121* 116* 95    Assessment/Plan: S/P Procedure(s) (LRB): CORONARY ARTERY BYPASS GRAFTING (CABG) x 3 using endoscopically harvested right saphenous vein and left internal mammary artery and closure of left atrial appendage (N/A) INTRAOPERATIVE TRANSESOPHAGEAL ECHOCARDIOGRAM (N/A)  1. CV- previous A. Fib, currently NSR- off amiodarone due to nausea- continue Digoxin and Lopressor 2. Pulm- no acute issues, off oxygen, CXR order but not completed 3. Renal- creatinine WNL, weight is up 13 lbs since admission on Lasix 4. LOC constipation, will order Lactulose 5. DM- sugars controlled, some continued hypoglycemia- will decrease insulin 6. Dispo- patient progressing, no further A. Fib, will d/c EPW in AM   LOS: 6 days    BARRETT, Mary 01/12/2014  Plan DC home with HHN, HPT She cannot tolerate coumadin or Pradaxa- ECASA only for hx AFIB  patient examined and medical record reviewed,agree with above note. Mary [name],Mary [name] 01/12/2014

## 2014-01-12 NOTE — Care Management Note (Signed)
    Page 1 of 2   01/15/2014     5:31:39 PM   CARE MANAGEMENT NOTE 01/15/2014  Patient:  Mary [name], Mary [name]   Account Number:  [id number]  Date Initiated:  01/08/2014  Documentation initiated by:  [facility]  Subjective/Objective Assessment:   NSTEMI -  S/P CABG X 3 ON 3/17     Action/Plan:   Anticipated DC Date:  01/14/2014   Anticipated DC Plan:  [place]  CM consult      [facility] Choice  HOME HEALTH   Choice offered to / List presented to:  C-1 Patient   DME arranged  [place]      DME agency  [place] arranged  HH-1 RN  [place].   Status of service:  Completed, signed off Medicare Important Message given?   (If response is "NO", the following Medicare IM given date fields will be blank) Date Medicare IM given:   Date Additional Medicare IM given:    Discharge Disposition:  [place]  Per UR Regulation:  Reviewed for med. necessity/level of care/duration of stay  If discussed at [place] of Stay Meetings, dates discussed:    Comments:  Contact:  Lalena, Salas [phone]                 Simpson,Dorothy Friend [phone]   01/15/14 Kairie Vangieson,RN,BSN [phone] PT [place]; MET WITH PT TO DISCUSS HH FOLLOW UP. SHE IS AGREEABLE TO HH FOLLOW UP, AND CHOOSES [facility] FOR HH NEEDS.  SHE REQUESTS ROLLATOR WALKER AND 3 IN 1 FOR HOME. REFERRAL TO [facility] FOR [facility] AND DME NEEDS.  START OF CARE 24-48H POST DC DATE.  01/12/14 Heath Tesler,RN,BSN [phone] ATTEMPTED TO MEET WITH PT X 2 TO DISCUSS HH ARRANGEMENTS. PT AMBULATING WITH REHAB, AND UP TO BSC.  WILL ASK WEEKEND CM TO FOLLOW.  01-10-14 7:45am [place] [phone] Post op CABG x3 on 01-09-14.

## 2014-01-12 NOTE — Progress Notes (Signed)
Pt amb 150 ft pushing rolling walker. No SOB or dizziness. Will continue to monitor pt closely.

## 2014-01-13 LAB — CBC
HCT: 28.6 % — ABNORMAL LOW (ref 36.0–46.0)
Hemoglobin: 9.6 g/dL — ABNORMAL LOW (ref 12.0–15.0)
MCH: 30.7 pg (ref 26.0–34.0)
MCHC: 33.6 g/dL (ref 30.0–36.0)
MCV: 91.4 fL (ref 78.0–100.0)
Platelets: 161 10*3/uL (ref 150–400)
RBC: 3.13 MIL/uL — ABNORMAL LOW (ref 3.87–5.11)
RDW: 14.1 % (ref 11.5–15.5)
WBC: 20.1 10*3/uL — ABNORMAL HIGH (ref 4.0–10.5)

## 2014-01-13 LAB — GLUCOSE, CAPILLARY
GLUCOSE-CAPILLARY: 55 mg/dL — AB (ref 70–99)
Glucose-Capillary: 98 mg/dL (ref 70–99)
Glucose-Capillary: 112 mg/dL — ABNORMAL HIGH (ref 70–99)
Glucose-Capillary: 150 mg/dL — ABNORMAL HIGH (ref 70–99)
Glucose-Capillary: 117 mg/dL — ABNORMAL HIGH (ref 70–99)

## 2014-01-13 LAB — BASIC METABOLIC PANEL
BUN: 14 mg/dL (ref 6–23)
CO2: 29 mEq/L (ref 19–32)
Calcium: 8.7 mg/dL (ref 8.4–10.5)
Chloride: 93 mEq/L — ABNORMAL LOW (ref 96–112)
Creatinine, Ser: 0.43 mg/dL — ABNORMAL LOW (ref 0.50–1.10)
GFR calc Af Amer: >90 mL/min (ref >90)
GFR calc non Af Amer: >90 mL/min (ref >90)
Glucose, Bld: 95 mg/dL (ref 70–99)
Potassium: 3.4 mEq/L — ABNORMAL LOW (ref 3.7–5.3)
Sodium: 134 mEq/L — ABNORMAL LOW (ref 137–147)

## 2014-01-13 LAB — CLOSTRIDIUM DIFFICILE BY PCR: CDIFFPCR: NEGATIVE

## 2014-01-13 MED ORDER — INSULIN DETEMIR 100 UNIT/ML ~~LOC~~ SOLN
5.0000 [IU] | Freq: Every day | SUBCUTANEOUS | Status: DC
  Administered 2014-01-13 – 2014-01-14 (×2): 5 [IU] via SUBCUTANEOUS
  Filled 2014-01-13 (×3): qty 0.05

## 2014-01-13 MED ORDER — LINAGLIPTIN 5 MG PO TABS
5.0000 mg | ORAL_TABLET | Freq: Every day | ORAL | Status: DC
  Administered 2014-01-13 – 2014-01-15 (×3): 5 mg via ORAL
  Filled 2014-01-13 (×3): qty 1

## 2014-01-13 MED ORDER — ONDANSETRON HCL 4 MG/2ML IJ SOLN: 4.0000 mg | Freq: Four times a day (QID) | INTRAMUSCULAR | Status: DC | PRN

## 2014-01-13 NOTE — Progress Notes (Signed)
EPws removed per Md order and protocol. Wire ends intact. Pt tolerated procedure well. VSS. Pt resting in bed X 1 hour with call bell within reach. Will continue to montior

## 2014-01-13 NOTE — Progress Notes (Signed)
Hypoglycemic Event  CBG: 55  Treatment: 15 GM carbohydrate snack  Symptoms: Sweaty  Follow-up CBG: Time: 21:44 CBG Result: 117  Possible Reasons for Event: Unknown     Freddy Jaksch  Remember to initiate Hypoglycemia Order Set & complete

## 2014-01-13 NOTE — Progress Notes (Signed)
CARDIAC REHAB PHASE I   PRE:  Rate/Rhythm: Sinus Rhythm 89  BP:    Sitting: 145/74    SaO2: 97% room air  MODE:  Ambulation: 350 ft   POST:  Rate/Rhythem: 92 Sinus Rhythm  BP:    Sitting: 140/82     SaO2: 99% Room Air  1040-1100 Patient ambulated in hallway without difficulty with rollator. Stopped times one to rest. Patient assisted back to bed with call light within reach encouraged patient to use her incentive spirometer.  Whitaker, Christa See RN BSN

## 2014-01-13 NOTE — Progress Notes (Signed)
Patient ambulated approximately 150 ft this morning with rolling walker and RN.  Tolerated fairly well, steady on her feet and HR stable.  No SOB.  Patient left resting in room with call bell within reach.  Will continue to monitor.  Mary [name] Patient

## 2014-01-13 NOTE — Progress Notes (Addendum)
      [address]       [place] [address]             [phone]      4 Days Post-Op Procedure(s) (LRB): CORONARY ARTERY BYPASS GRAFTING (CABG) x 3 using endoscopically harvested right saphenous vein and left internal mammary artery and closure of left atrial appendage (N/A) INTRAOPERATIVE TRANSESOPHAGEAL ECHOCARDIOGRAM (N/A)  Subjective:  Mary [name] does not feel well this morning.  She states she has nausea and diarrhea. She denies any vomiting   Objective: Vital signs in last 24 hours: Temp:  [97.4 F (36.3 C)-98.4 F (36.9 C)] 98.3 F (36.8 C) (03/21 0406) Pulse Rate:  [77-89] 77 (03/21 0406) Cardiac Rhythm:  [-] Normal sinus rhythm (03/20 2030) Resp:  [18] 18 (03/21 0406) BP: (113-167)/(69-88) 113/69 mmHg (03/21 0406) SpO2:  [94 %-97 %] 94 % (03/21 0406) Weight:  [149 lb 7.6 oz (67.8 kg)] 149 lb 7.6 oz (67.8 kg) (03/21 0406)  Intake/Output from previous day: 03/20 0701 - 03/21 0700 In: 120 [P.O.:120] Out: 450 [Urine:450]  General appearance: alert, cooperative and no distress Heart: regular rate and rhythm Lungs: clear to auscultation bilaterally Abdomen: soft, non-tender; bowel sounds normal; no masses,  no organomegaly Extremities: edema trace Wound: clean and dr  Lab Results:  Recent Labs  01/11/14 0210 01/12/14 0416  WBC 23.7* 26.1*  HGB 9.5* 9.8*  HCT 28.9* 29.4*  PLT 139* 129*   BMET:  Recent Labs  01/11/14 0210 01/12/14 0416  NA 137 140  K 4.3 3.7  CL 100 99  CO2 25 30  GLUCOSE 100* 83  BUN 11 20  CREATININE 0.32* 0.52  CALCIUM 7.9* 8.9    PT/INR: No results found for this basename: LABPROT, INR,  in the last 72 hours ABG    Component Value Date/Time   PHART 7.372 01/09/2014 2005   HCO3 29.5* 01/09/2014 2005   TCO2 24 01/10/2014 1714   ACIDBASEDEF 1.0 01/07/2014 1519   O2SAT 94.0 01/09/2014 2005   CBG (last 3)   Recent Labs  01/12/14 2113 01/12/14 2231 01/13/14 0645  GLUCAP 46* 135* 98     Assessment/Plan: S/P Procedure(s) (LRB): CORONARY ARTERY BYPASS GRAFTING (CABG) x 3 using endoscopically harvested right saphenous vein and left internal mammary artery and closure of left atrial appendage (N/A) INTRAOPERATIVE TRANSESOPHAGEAL ECHOCARDIOGRAM (N/A)  1. CV- previous A. Fib, maintaining NSR- continue Digoxin, Lopressor- will d/c EPW 2. Pulm- no acute issues 3. Renal- weight is up 14 lbs, continue diuretic 4. GI- nausea, diarrhea- on phenergan, will add Zofran, send C.Diff, stop all stool softners 5. DM- continues to have some hypoglycemia will stop insulin, cover with SSIP and restart home Januvia 6. Dispo- patient with nausea, diarrhea today, d/c EPW   LOS: 7 days    Mary [name], Mary [name] 01/13/2014  I have seen and examined the patient and agree with the assessment and plan as outlined.  Wajiha Versteeg H 01/13/2014 11:36 AM

## 2014-01-13 NOTE — Progress Notes (Signed)
Pt ambulated in hallway 400 ft on room air with rolling walker. Pt tolerated activity well. Will continue to monitor.

## 2014-01-14 DIAGNOSIS — Z951 Presence of aortocoronary bypass graft: Secondary | ICD-10-CM

## 2014-01-14 LAB — GLUCOSE, CAPILLARY
GLUCOSE-CAPILLARY: 92 mg/dL (ref 70–99)
GLUCOSE-CAPILLARY: 139 mg/dL — AB (ref 70–99)
Glucose-Capillary: 130 mg/dL — ABNORMAL HIGH (ref 70–99)
Glucose-Capillary: 176 mg/dL — ABNORMAL HIGH (ref 70–99)
Glucose-Capillary: 106 mg/dL — ABNORMAL HIGH (ref 70–99)

## 2014-01-14 LAB — CULTURE, BLOOD (SINGLE): Culture: NO GROWTH

## 2014-01-14 NOTE — Progress Notes (Signed)
Pt ambulated in hallway 500 ft with rolling walker on room air and tolerated activity well. Will continue to monitor.

## 2014-01-14 NOTE — Progress Notes (Addendum)
      [address]       [name],Mary [address]             [phone]       5 Days Post-Op Procedure(s) (LRB): CORONARY ARTERY BYPASS GRAFTING (CABG) x 3 using endoscopically harvested right saphenous vein and left internal mammary artery and closure of left atrial appendage (N/A) INTRAOPERATIVE TRANSESOPHAGEAL ECHOCARDIOGRAM (N/A)  Subjective:  Mary [name] is feeling better this morning.  She is no longer experiencing nausea, but has had a little more diarrhea.  She is ambulating some and was encouraged to walk at least 3 times today.  Objective: Vital signs in last 24 hours: Temp:  [96.7 F (35.9 C)-98.4 F (36.9 C)] 98.4 F (36.9 C) (03/22 0302) Pulse Rate:  [82-91] 82 (03/22 0302) Cardiac Rhythm:  [-] Normal sinus rhythm (03/21 2040) Resp:  [18] 18 (03/22 0302) BP: (116-140)/(56-84) 129/77 mmHg (03/22 0302) SpO2:  [94 %-95 %] 95 % (03/22 0302) Weight:  [144 lb 10 oz (65.6 kg)] 144 lb 10 oz (65.6 kg) (03/22 0258)  Intake/Output from previous day: 03/21 0701 - 03/22 0700 In: 120 [P.O.:120] Out: -   General appearance: alert, cooperative and no distress Heart: regular rate and rhythm Lungs: clear to auscultation bilaterally Abdomen: soft, non-tender; bowel sounds normal; no masses,  no organomegaly Extremities: edema trace Wound: clean and dry  Lab Results:  Recent Labs  01/12/14 0416 01/13/14 0800  WBC 26.1* 20.1*  HGB 9.8* 9.6*  HCT 29.4* 28.6*  PLT 129* 161   BMET:  Recent Labs  01/12/14 0416 01/13/14 0800  NA 140 134*  K 3.7 3.4*  CL 99 93*  CO2 30 29  GLUCOSE 83 95  BUN 20 14  CREATININE 0.52 0.43*  CALCIUM 8.9 8.7    PT/INR: No results found for this basename: LABPROT, INR,  in the last 72 hours ABG    Component Value Date/Time   PHART 7.372 01/09/2014 2005   HCO3 29.5* 01/09/2014 2005   TCO2 24 01/10/2014 1714   ACIDBASEDEF 1.0 01/07/2014 1519   O2SAT 94.0 01/09/2014 2005   CBG (last 3)   Recent Labs  01/13/14 2059  01/13/14 2144 01/14/14 0640  GLUCAP 55* 117* 130*    Assessment/Plan: S/P Procedure(s) (LRB): CORONARY ARTERY BYPASS GRAFTING (CABG) x 3 using endoscopically harvested right saphenous vein and left internal mammary artery and closure of left atrial appendage (N/A) INTRAOPERATIVE TRANSESOPHAGEAL ECHOCARDIOGRAM (N/A)  1.  CV- maintaining NSR- on Digoxin, Lopressor 2. Pulm- no acute issues 3. Renal- remains volume overloaded, but improving, weight up 9lbs 4. GI - nausea resolved, diarrhea improving 5. DM- sugars remain controlled, on home Januvia 6. Dipso- patient needs to ambulate, feeling better, plan to discharge in AM   LOS: 8 days    BARRETT, ERIN 01/14/2014  I have seen and examined the patient and agree with the assessment and plan as outlined.  OWEN,CLARENCE H 01/14/2014 11:17 AM

## 2014-01-14 NOTE — Discharge Summary (Signed)
Physician Discharge Summary  Patient ID: Mary [name] MRN: [id number] DOB/AGE: [birth year] 63 y.o.  Admit date: 01/06/2014 Discharge date: 01/15/2014  Admission Diagnoses:  Patient Active Problem List   Diagnosis Date Noted  . NSTEMI (non-ST elevated myocardial infarction) 01/06/2014  . Atrial fibrillation with rapid ventricular response 01/06/2014  . Leukocytosis 01/06/2014  . Acute diastolic heart failure Q000111Q  . Acute delirium 10/26/2013  . Chest pain 09/18/2013  . Fall at home 09/18/2013  . Dehydration 09/18/2013  . Recurrent falls 09/18/2013  . Tinnitus of left ear 02/19/2013  . Diastolic congestive heart failure 09/10/2012  . Atrial fibrillation 09/10/2012  . DDD (degenerative disc disease), lumbar 08/26/2012  . Atypical mole 04/14/2012  . Seborrheic keratosis 04/14/2012  . Tobacco use 04/14/2012  . Depression 12/09/2011  . COPD (chronic obstructive pulmonary disease) 06/27/2011  . Anxiety 06/27/2011  . Chronic pain 05/27/2011  . Diabetes mellitus 05/26/2011  . Hyperlipidemia 05/26/2011    Discharge Diagnoses:   Patient Active Problem List   Diagnosis Date Noted  . 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 NSTEMI (non-ST elevated myocardial infarction) 01/06/2014  . Atrial fibrillation with rapid ventricular response 01/06/2014  . Leukocytosis 01/06/2014  . Acute diastolic heart failure Q000111Q  . Acute delirium 10/26/2013  . Chest pain 09/18/2013  . Fall at home 09/18/2013  . Dehydration 09/18/2013  . Recurrent falls 09/18/2013  . Tinnitus of left ear 02/19/2013  . Diastolic congestive heart failure 09/10/2012  . Atrial fibrillation 09/10/2012  . DDD (degenerative disc disease), lumbar 08/26/2012  . Atypical mole 04/14/2012  . Seborrheic keratosis 04/14/2012  . Tobacco use 04/14/2012  . Depression 12/09/2011  . COPD (chronic obstructive pulmonary disease) 06/27/2011  . Anxiety 06/27/2011  . Chronic pain 05/27/2011  . Diabetes mellitus 05/26/2011  .  Hyperlipidemia 05/26/2011   Discharged Condition: good    History of Present Illness:   Ms. Mary [name] is a 63 yo white female with known history of diabetes, HTN, and Atrial Fibrillation.  Over the past several months the patient developed complaints of chest pain and shortness of breath.  The pain has progressed off and on over the past several weeks.  She presented to an OSH Emergency Department with similar complaints.  CTA of the chest was obtained and ruled out the presence of Pulmonary Embolism.  Cardiac enzymes were obtained and were positive.  Therefore, she was transferred to [facility] for further care.      Hospital Course:   Repeat EKG was obtained and showed the patient to be in Atrial Fibrillation and no ST changes were identified.  She was ruled in for NSTEMI and admitted by Cardiology for further care.  Cardiac catheterization was performed and showed 3V CAD with LM involvement.  Her EF was reduced at  30%.  During the procedure the patient developed mild hypotension and a balloon pump was inserted.  It was felt Coronary Bypass Grafting was the best treatment option and TCTS was consulted.  The patient was evaluated by Dr. Prescott Gum who was in agreement that she would benefit from Coronary Bypass procedure.  However it was felt her enzymes should be cycled and surgery would occur once her enzymes had peaked.  The risks and benefits of the procedure were explained to the patient and she was agreeable to proceed.    The patient was taken to the operating room on 01/09/14.  She underwent CABG x 3 utilizing LIMA to LAD, SVG to OM, and SVG to Ramus Intermediate.  She also underwent Endoscopic saphenous vein harvest of her right thigh.  She tolerated the procedure well and was taken to the SICU in stable condition.  The patient was extubated the evening of surgery.  During her stay in the ICU the patient was weaned off Dopamine and Milrinone as tolerated.  Her balloon pump was removed on  POD #1.  The patient developed SVT and was placed on IV Amiodarone with conversion to NSR.  The patient as started on diuretics for hypervolemia.  She was ambulating without difficulty and was felt medically stable and was transferred to the step down unit.  The patient continued to progress.  She was maintaining NSR and her pacing wires were removed without difficulty.  She was having issues with hypoglycemia and was taken off insulin.  She was restarted on her home Januvia.  She developed nausea and diarrhea.  C. Diff was obtained and was negative.  The patient remains on Lasix for hypervolemia.  She is ambulating with minimal difficulty.  She is medically stable at this time and we anticipate discharge home in the next 24-48 hours.  She will follow up with Dr. Prescott Gum in 3 weeks with a CXR prior to the appointment.             Significant Diagnostic Studies:   Cardiac Catheterization:  AO SYSTOLIC/AO DIASTOLIC: [id number]  LV SYSTOLIC/LV DIASTOLIC: 99991111   ANGIOGRAPHIC RESULTS:  1. Left main; 80% tapered, calcified with damping using 6 French catheter  2. LAD; 95-99% proximal to mid LAD with TIMI 2 flow  3. Left circumflex; occluded after the first large bifurcating obtuse marginal branch. The filled out left left collaterals. There was 70% middle stenosis in the AV groove prior to the LM branch as well as disease in the proximal OM.Marland Kitchen  4. Right coronary artery; comment the small caliber occluded in the midportion with bidirectional collaterals  5.LIMA was subselectively visualized and widely patent. It was suitable for use during coronary bypass grafting if necessary  6. Left ventriculography; RAO left ventriculogram was performed using  25 mL of Visipaque dye at 12 mL/second. The overall LVEF estimated  25-30 % without focal wall motion abnormalitie   Treatments:  1. Coronary artery bypass grafting x3 (left internal mammary artery to  LAD, saphenous vein graft to ramus intermediate,  saphenous vein  graft to circumflex marginal).  2. Application of left atrial clip.  3. Endoscopic harvest of right leg greater saphenous vein.    Disposition: 01-Home or Self Care    Discharge Medications:    Medication List         ALPRAZolam 1 MG tablet  Commonly known as:  XANAX  Take 1 tablet (1 mg total) by mouth 2 (two) times daily as needed for anxiety.     aspirin EC 325 MG tablet  Take 1 tablet (325 mg total) by mouth daily.     atorvastatin 20 MG tablet  Commonly known as:  LIPITOR  Take 1 tablet (20 mg total) by mouth daily.     budesonide-formoterol 160-4.5 MCG/ACT inhaler  Commonly known as:  SYMBICORT  Inhale 2 puffs into the lungs 2 (two) times daily.     digoxin 0.25 MG tablet  Commonly known as:  LANOXIN  Take 1 tablet (0.25 mg total) by mouth daily.     ferrous Q000111Q C-folic acid capsule  Commonly known as:  [name] / FOLTRIN  Take 1 capsule by mouth daily.     FLUoxetine 40 MG capsule  Commonly known as:  PROZAC  Take 1 capsule (40 mg total) by mouth daily.     furosemide 40 MG tablet  Commonly known as:  LASIX  Take 1 tablet (40 mg total) by mouth daily. X 1 week     HYDROmorphone 4 MG tablet  Commonly known as:  DILAUDID  Take 1 tablet (4 mg total) by mouth every 6 (six) hours as needed for severe pain.     INVOKANA 100 MG Tabs  Generic drug:  Canagliflozin  Take 1 tablet by mouth daily.     lisinopril 2.5 MG tablet  Commonly known as:  PRINIVIL,ZESTRIL  Take 1 tablet by mouth daily.     metoprolol tartrate 25 MG tablet  Commonly known as:  LOPRESSOR  Take 0.5 tablets (12.5 mg total) by mouth 2 (two) times daily.     ondansetron 4 MG tablet  Commonly known as:  ZOFRAN  Take 1 tablet (4 mg total) by mouth every 6 (six) hours as needed for nausea.     oxyCODONE-acetaminophen 7.5-325 MG per tablet  Commonly known as:  PERCOCET  Take 1 tablet by mouth 2 (two) times daily as needed for pain.     Potassium  Chloride ER 20 MEQ Tbcr  Take 20 mEq by mouth daily. X 1 week     PROAIR HFA 108 (90 BASE) MCG/ACT inhaler  Generic drug:  albuterol  Inhale 2 puffs into the lungs every 6 (six) hours as needed for wheezing or shortness of breath.     sitaGLIPtin 100 MG tablet  Commonly known as:  JANUVIA  take 1 tablet by mouth once daily     sodium chloride 0.65 % nasal spray  Commonly known as:  OCEAN  Place 1 spray into the nose as needed. Congestion     thiamine 100 MG tablet  Take 1 tablet (100 mg total) by mouth daily.     traMADol 50 MG tablet  Commonly known as:  ULTRAM  Take 1 tablet (50 mg total) by mouth every 6 (six) hours as needed for moderate pain.        The patient has been discharged on:   1.Beta Blocker:  Yes [x   ]                              No   [   ]                              If No, reason:  2.Ace Inhibitor/ARB: Yes [ x  ]                                     No  [    ]                                     If No, reason::  3.Statin:   Yes [x   ]                  No  [   ]                  If No, reason:  4.Ecasa:  Yes  [   x]  No   [   ]                  If No, reason    Future Appointments Provider Department Dept Phone   01/24/2014 2:30 PM Troy Sine, MD [facility] [phone]       Signed: Ellwood Handler 01/14/2014, 11:14 AM

## 2014-01-14 NOTE — Progress Notes (Signed)
Pt ambulated in hallway 500 ft with rolling walker. Pt tolerated activity well. Will continue to monitor

## 2014-01-15 LAB — BASIC METABOLIC PANEL
BUN: 7 mg/dL (ref 6–23)
CO2: 31 mEq/L (ref 19–32)
Calcium: 8.5 mg/dL (ref 8.4–10.5)
Chloride: 95 mEq/L — ABNORMAL LOW (ref 96–112)
Creatinine, Ser: 0.46 mg/dL — ABNORMAL LOW (ref 0.50–1.10)
GLUCOSE: 119 mg/dL — AB (ref 70–99)
POTASSIUM: 3.1 meq/L — AB (ref 3.7–5.3)
SODIUM: 137 meq/L (ref 137–147)

## 2014-01-15 LAB — SPIROMETRY WITH GRAPH
FEF 25-75 Post: 0.66 L/sec
FEF 25-75 Pre: 0.66 L/sec
FEF2575-%Change-Post: 0 %
FEF2575-%Pred-Post: 28 %
FEF2575-%Pred-Pre: 28 %
FEV1-%Change-Post: 0 %
FEV1-%Pred-Post: 41 %
FEV1-%Pred-Pre: 41 %
FEV1-Post: 1.13 L
FEV1-Pre: 1.12 L
FEV1FVC-%Change-Post: -5 %
FEV1FVC-%Pred-Pre: 88 %
FEV6-%Change-Post: 5 %
FEV6-%Pred-Post: 50 %
FEV6-%Pred-Pre: 47 %
FEV6-Post: 1.73 L
FEV6-Pre: 1.65 L
FEV6FVC-%Change-Post: -1 %
FEV6FVC-%Pred-Post: 103 %
FEV6FVC-%Pred-Pre: 104 %
FVC-%Change-Post: 6 %
FVC-%Pred-Post: 49 %
FVC-%Pred-Pre: 46 %
FVC-Post: 1.75 L
Post FEV1/FVC ratio: 65 %
Post FEV6/FVC ratio: 99 %
Pre FEV1/FVC ratio: 68 %
Pre FEV6/FVC Ratio: 100 %

## 2014-01-15 LAB — CBC
HCT: 28.5 % — ABNORMAL LOW (ref 36.0–46.0)
HEMOGLOBIN: 9.6 g/dL — AB (ref 12.0–15.0)
MCH: 31.2 pg (ref 26.0–34.0)
MCHC: 33.7 g/dL (ref 30.0–36.0)
MCV: 92.5 fL (ref 78.0–100.0)
Platelets: 221 10*3/uL (ref 150–400)
RBC: 3.08 MIL/uL — ABNORMAL LOW (ref 3.87–5.11)
RDW: 14.2 % (ref 11.5–15.5)
WBC: 14.3 10*3/uL — ABNORMAL HIGH (ref 4.0–10.5)

## 2014-01-15 LAB — GLUCOSE, CAPILLARY
GLUCOSE-CAPILLARY: 174 mg/dL — AB (ref 70–99)
Glucose-Capillary: 112 mg/dL — ABNORMAL HIGH (ref 70–99)

## 2014-01-15 MED ORDER — POTASSIUM CHLORIDE ER 10 MEQ PO TBCR
40.0000 meq | EXTENDED_RELEASE_TABLET | Freq: Every day | ORAL | Status: DC
Start: 2014-01-15 — End: 2014-01-15
  Filled 2014-01-15: qty 4

## 2014-01-15 MED ORDER — METOPROLOL TARTRATE 25 MG PO TABS: 12.5000 mg | ORAL_TABLET | Freq: Two times a day (BID) | ORAL | Status: DC

## 2014-01-15 MED ORDER — ASPIRIN EC 325 MG PO TBEC: 325.0000 mg | DELAYED_RELEASE_TABLET | Freq: Every day | ORAL | Status: DC

## 2014-01-15 MED ORDER — FE FUMARATE-B12-VIT C-FA-IFC PO CAPS: 1.0000 | ORAL_CAPSULE | Freq: Every day | ORAL | Status: DC

## 2014-01-15 MED ORDER — FUROSEMIDE 40 MG PO TABS: 40.0000 mg | ORAL_TABLET | Freq: Every day | ORAL | Status: DC

## 2014-01-15 MED ORDER — POTASSIUM CHLORIDE ER 20 MEQ PO TBCR: 20.0000 meq | EXTENDED_RELEASE_TABLET | Freq: Every day | ORAL | Status: DC

## 2014-01-15 MED ORDER — TRAMADOL HCL 50 MG PO TABS: 50.0000 mg | ORAL_TABLET | Freq: Four times a day (QID) | ORAL | Status: DC | PRN

## 2014-01-15 NOTE — Progress Notes (Signed)
[id number] Cardiac Rehab Completed discharge education with pt. We discussed smoking cessation. Pt stated that she is not ever going to smoke again. I gave pt tips for quitting, quit smart class information and coaching contact number. Pt states that it has almost been a year since she has smoked.She seems very motivated to not smoking. I put surgery discharge video for her to watch. Pt agrees to [place]. CRP in [place], will send referral. Deon Pilling, RN 01/15/2014 10:17 AM

## 2014-01-15 NOTE — Progress Notes (Signed)
Physical Therapy Treatment Patient Details Name: Mary [name] MRN: [id number] DOB: [birth year] Today's Date: 01/15/2014 Time: [id number] PT Time Calculation (min): 23 min  PT Assessment / Plan / Recommendation  History of Present Illness s/p Cabg x3   PT Comments   Pt states she is to d/c today and finishing up informative video when PT enters. Pt progressing well towards physical therapy goals and was able to improve ambulation distance today. Discussed general safety awareness and sternal precautions and pt reports understanding with no further questions for therapist. Will continue to follow until D/C.   Follow Up Recommendations  Home health PT;Other (comment)     Does the patient have the potential to tolerate intense rehabilitation     Barriers to Discharge        Equipment Recommendations  3in1 (PT);Other (comment) (Rollator - RW if rollator unavailable.)    Recommendations for Other Services    Frequency Min 3X/week   Progress towards PT Goals Progress towards PT goals: Progressing toward goals  Plan Current plan remains appropriate    Precautions / Restrictions Precautions Precautions: Fall;Sternal Precaution Comments: Reviewed sternal precautions with pt using teach back method.  Restrictions Weight Bearing Restrictions: No   Pertinent Vitals/Pain On RA throughout session. Pt reports minimal SOB throughout gait training. Reports standing rest break was "just in case", and did not want to "over-do it".     Mobility  Bed Mobility Overal bed mobility: Needs Assistance Bed Mobility: Supine to Sit Supine to sit: Supervision;HOB elevated General bed mobility comments: VC's for hand placement to maintain sternal precautions. Pt able to transition to EOB with no physical assist.  Transfers Overall transfer level: Needs assistance Equipment used: Rolling walker (2 wheeled) Transfers: Sit to/from Stand Sit to Stand: Min guard;From elevated surface General transfer  comment: Bed height raised to simulate bed/chair height at home. Pt able to power-up to full standing with min guard from elevated surface but could not do so from original lowered position.  Ambulation/Gait Ambulation/Gait assistance: Supervision;Min guard Ambulation Distance (Feet): 200 Feet Assistive device: Rolling walker (2 wheeled) Gait Pattern/deviations: Step-through pattern;Decreased stride length;Trunk flexed Gait velocity: slowed Gait velocity interpretation: Below normal speed for age/gender General Gait Details: VC's for safety awareness with the RW. Pt requesting rollator for home and discussed differences between the 2 from a safety standpoint. Pt also cued for pursed-lip breathing. One standing rest break ~1 minute during.  Stairs:  (Pt has no steps to enter apartment)    Exercises     PT Diagnosis:    PT Problem List:   PT Treatment Interventions:     PT Goals (current goals can now be found in the care plan section) Acute Rehab PT Goals Patient Stated Goal: Get back home PT Goal Formulation: With patient Time For Goal Achievement: 01/25/14 Potential to Achieve Goals: Good  Visit Information  Last PT Received On: 01/15/14 Assistance Needed: +1 History of Present Illness: s/p Cabg x3    Subjective Data  Subjective: "It wouldn't hurt to walk a little more before d/c." Patient Stated Goal: Get back home   Cognition  Cognition Arousal/Alertness: Awake/alert Behavior During Therapy: WFL for tasks assessed/performed Overall Cognitive Status: Within Functional Limits for tasks assessed    Balance  Balance Overall balance assessment: Needs assistance Sitting-balance support: Feet supported Sitting balance-Leahy Scale: Good Standing balance support: Bilateral upper extremity supported Standing balance-Leahy Scale: Fair  End of Session PT - End of Session Equipment Utilized During Treatment: Gait belt Activity Tolerance: Patient tolerated  treatment well Patient  left: in chair;with call bell/phone within reach Nurse Communication: Mobility status   GP     Jolyn Lent 01/15/2014, 11:03 AM  Jolyn Lent, PT, DPT Acute Rehabilitation Services Pager: [phone]

## 2014-01-15 NOTE — Progress Notes (Addendum)
       [address]       [place] [address]             [phone]          6 Days Post-Op Procedure(s) (LRB): CORONARY ARTERY BYPASS GRAFTING (CABG) x 3 using endoscopically harvested right saphenous vein and left internal mammary artery and closure of left atrial appendage (N/A) INTRAOPERATIVE TRANSESOPHAGEAL ECHOCARDIOGRAM (N/A)  Subjective: Feels well, wants to go home.   Objective: Vital signs in last 24 hours: Patient Vitals for the past 24 hrs:  BP Temp Temp src Pulse Resp SpO2 Weight  01/15/14 0624 - - - - - - 142 lb 4.8 oz (64.547 kg)  01/15/14 0615 134/65 mmHg 98.6 F (37 C) Oral 90 18 92 % -  01/14/14 2038 107/67 mmHg 98.4 F (36.9 C) Oral 86 19 95 % -  01/14/14 1950 - - - - - 93 % -  01/14/14 1505 105/58 mmHg 98.6 F (37 C) Oral 95 17 98 % -   Current Weight  01/15/14 142 lb 4.8 oz (64.547 kg)  PRE-OPERATIVE WEIGHT: 63 kg    Intake/Output from previous day: 03/22 0701 - 03/23 0700 In: 720 [P.O.:720] Out: -     PHYSICAL EXAM:  Heart: RRR Lungs: Clear Wound: Clean and dry Extremities: Mild LE edema    Lab Results: CBC: Recent Labs  01/13/14 0800 01/15/14 0535  WBC 20.1* 14.3*  HGB 9.6* 9.6*  HCT 28.6* 28.5*  PLT 161 221   BMET:  Recent Labs  01/13/14 0800 01/15/14 0535  NA 134* 137  K 3.4* 3.1*  CL 93* 95*  CO2 29 31  GLUCOSE 95 119*  BUN 14 7  CREATININE 0.43* 0.46*  CALCIUM 8.7 8.5    PT/INR: No results found for this basename: LABPROT, INR,  in the last 72 hours    Assessment/Plan: S/P Procedure(s) (LRB): CORONARY ARTERY BYPASS GRAFTING (CABG) x 3 using endoscopically harvested right saphenous vein and left internal mammary artery and closure of left atrial appendage (N/A) INTRAOPERATIVE TRANSESOPHAGEAL ECHOCARDIOGRAM (N/A) CV- stable,SR. Continue current meds. Hypokalemia- replace K+. Vol overload- diurese. Plan d/c home today- instructions reviewed with patient.    LOS: 9 days     COLLINS,GINA H 01/15/2014

## 2014-01-15 NOTE — Discharge Summary (Signed)
patient examined and medical record reviewed,agree with above note. VAN TRIGT III,PETER 01/15/2014

## 2014-01-24 ENCOUNTER — Ambulatory Visit: Payer: Medicare Other | Admitting: Cardiovascular Disease

## 2014-01-24 DIAGNOSIS — I2510 Atherosclerotic heart disease of native coronary artery without angina pectoris: Secondary | ICD-10-CM

## 2014-01-24 DIAGNOSIS — Z48812 Encounter for surgical aftercare following surgery on the circulatory system: Secondary | ICD-10-CM

## 2014-01-29 ENCOUNTER — Telehealth: Payer: Self-pay | Admitting: Family Medicine

## 2014-01-29 ENCOUNTER — Other Ambulatory Visit: Payer: Self-pay | Admitting: *Deleted

## 2014-01-29 DIAGNOSIS — I2510 Atherosclerotic heart disease of native coronary artery without angina pectoris: Secondary | ICD-10-CM

## 2014-01-29 MED ORDER — OXYCODONE-ACETAMINOPHEN 7.5-325 MG PO TABS
1.0000 | ORAL_TABLET | Freq: Two times a day (BID) | ORAL | Status: DC | PRN
Start: 2014-01-29 — End: 2014-02-21

## 2014-01-29 MED ORDER — ALPRAZOLAM 1 MG PO TABS: 1.0000 mg | ORAL_TABLET | Freq: Two times a day (BID) | ORAL | Status: DC | PRN

## 2014-01-29 NOTE — Telephone Encounter (Signed)
Call back number is [phone] Pt is needing a refill on oxyCODONE-acetaminophen (PERCOCET) 7.5-325 MG per tablet And ALPRAZolam (XANAX) 1 MG tablet

## 2014-01-29 NOTE — Telephone Encounter (Signed)
Prescription printed and patient made aware to come to office to pick up.  

## 2014-01-29 NOTE — Telephone Encounter (Signed)
Okay to refill? 

## 2014-01-29 NOTE — Telephone Encounter (Signed)
Ok to refill??  Last office visit 12/06/2013.  Last refill 12/25/2013.

## 2014-01-31 ENCOUNTER — Ambulatory Visit (INDEPENDENT_AMBULATORY_CARE_PROVIDER_SITE_OTHER): Payer: Self-pay | Admitting: Cardiothoracic Surgery

## 2014-01-31 ENCOUNTER — Encounter: Payer: Self-pay | Admitting: Cardiothoracic Surgery

## 2014-01-31 ENCOUNTER — Ambulatory Visit
Admission: RE | Admit: 2014-01-31 | Discharge: 2014-01-31 | Disposition: A | Discharge status: Home / Self Care | Payer: Medicare Other | Source: Ambulatory Visit | Attending: Cardiothoracic Surgery | Admitting: Cardiothoracic Surgery

## 2014-01-31 VITALS — BP 94/60 | HR 54 | Resp 20 | Ht 67.0 in | Wt 135.0 lb

## 2014-01-31 DIAGNOSIS — I2510 Atherosclerotic heart disease of native coronary artery without angina pectoris: Secondary | ICD-10-CM

## 2014-01-31 DIAGNOSIS — Z951 Presence of aortocoronary bypass graft: Secondary | ICD-10-CM

## 2014-01-31 NOTE — Progress Notes (Signed)
PCP is Vic Blackbird, MD Referring Provider is Lorretta Harp, MD  Chief Complaint  Patient presents with  . Routine Post Op    F/U from surgery with CXR, S/P CABG x 3 on 01/09/2014    HPI: The patient returns for her first postop visit after multivessel CABG for severe CAD, unstable angina, and preoperative atrial fibrillation. Patient did well after surgery and is maintained sinus rhythm. She had some pulmonary issues from her COPD from long term smoking but is been able to successfully stop smoking. Since returning home she has had no recurrent angina and the surgical incisions are healing well. She is not resumed smoking. She denies any edema or symptoms of CHF. Her sister is caring for her at home.  Past Medical History  Diagnosis Date  . Diabetes mellitus   . Arthritis   . Anxiety   . Chronic pain     Bacl pain, Disc L5-S1- Dr. Joya Salm in [place]  . Hyperlipidemia   . Hypertension   . Tachycardia   . Bronchial asthma   . Atrial fibrillation   . COPD (chronic obstructive pulmonary disease)   . DDD (degenerative disc disease)     Radicular symptoms  . Shortness of breath   . CHF (congestive heart failure)   . Hx of echocardiogram     Was interpreted by Dr Doylene Canard that showed an Ef in the 50-60% range with grade 1 diastolic dysfunction. she had moderate MR, biatrial enlargement, moderate TR and at that time estimated RV systolic pressure was 43 mm.  . History of stress test 06/2011    Abnormal myocardial perfusion study.    Past Surgical History  Procedure Laterality Date  . Carpal tunnel release      x 2  . Breast cyst removed    . Abdominal hysterectomy      partial  . Tubal ligation    . Tonsillectomy    . Coronary artery bypass graft N/A 01/09/2014    Procedure: CORONARY ARTERY BYPASS GRAFTING (CABG) x 3 using endoscopically harvested right saphenous vein and left internal mammary artery and closure of left atrial appendage;  Surgeon: Ivin Poot, MD;  Location:  [facility];  Service: Open Heart Surgery;  Laterality: N/A;  patient has preop IA BP   . Intraoperative transesophageal echocardiogram N/A 01/09/2014    Procedure: INTRAOPERATIVE TRANSESOPHAGEAL ECHOCARDIOGRAM;  Surgeon: Ivin Poot, MD;  Location: [facility];  Service: Open Heart Surgery;  Laterality: N/A;    Family History  Problem Relation Age of Onset  . Diabetes Mother   . Heart disease Mother   . Diabetes Sister   . Hypertension Sister   . Hyperlipidemia Sister   . Diabetes Brother   . Heart disease Brother   . Diabetes Brother   . Heart disease Brother     Social History History  Substance Use Topics  . Smoking status: Former Smoker    Types: Cigarettes  . Smokeless tobacco: Not on file     Comment: quit 6 months ago  . Alcohol Use: No    Current Outpatient Prescriptions  Medication Sig Dispense Refill  . albuterol (PROAIR HFA) 108 (90 BASE) MCG/ACT inhaler Inhale 2 puffs into the lungs every 6 (six) hours as needed for wheezing or shortness of breath.       . ALPRAZolam (XANAX) 1 MG tablet Take 1 tablet (1 mg total) by mouth 2 (two) times daily as needed for anxiety.  60 tablet  0  . aspirin  EC 325 MG tablet Take 1 tablet (325 mg total) by mouth daily.      Marland Kitchen atorvastatin (LIPITOR) 20 MG tablet Take 1 tablet (20 mg total) by mouth daily.  30 tablet  6  . budesonide-formoterol (SYMBICORT) 160-4.5 MCG/ACT inhaler Inhale 2 puffs into the lungs 2 (two) times daily.  1 Inhaler  12  . digoxin (LANOXIN) 0.25 MG tablet Take 1 tablet (0.25 mg total) by mouth daily.  30 tablet  6  . ferrous Q000111Q C-folic acid ([name] / FOLTRIN) capsule Take 1 capsule by mouth daily.  30 capsule  1  . FLUoxetine (PROZAC) 40 MG capsule Take 1 capsule (40 mg total) by mouth daily.  30 capsule  3  . HYDROmorphone (DILAUDID) 4 MG tablet Take 1 tablet (4 mg total) by mouth every 6 (six) hours as needed for severe pain.  15 tablet  0  . INVOKANA 100 MG TABS Take 1 tablet by mouth daily.       Marland Kitchen lisinopril (PRINIVIL,ZESTRIL) 2.5 MG tablet Take 1 tablet by mouth daily.      . metoprolol tartrate (LOPRESSOR) 25 MG tablet Take 0.5 tablets (12.5 mg total) by mouth 2 (two) times daily.  60 tablet  1  . ondansetron (ZOFRAN) 4 MG tablet Take 1 tablet (4 mg total) by mouth every 6 (six) hours as needed for nausea.  20 tablet  0  . oxyCODONE-acetaminophen (PERCOCET) 7.5-325 MG per tablet Take 1 tablet by mouth 2 (two) times daily as needed for pain.  60 tablet  0  . sitaGLIPtin (JANUVIA) 100 MG tablet take 1 tablet by mouth once daily  30 tablet  3  . sodium chloride (OCEAN) 0.65 % nasal spray Place 1 spray into the nose as needed. Congestion      . thiamine 100 MG tablet Take 1 tablet (100 mg total) by mouth daily.  30 tablet  0  . traMADol (ULTRAM) 50 MG tablet Take 1 tablet (50 mg total) by mouth every 6 (six) hours as needed for moderate pain.  30 tablet  0   No current facility-administered medications for this visit.    Allergies  Allergen Reactions  . Adhesive [Tape] Other (See Comments)    Tears skin off.   . Latex Other (See Comments)    In tape, tears skin off.  . Zocor [Simvastatin - High Dose] Nausea And Vomiting    Review of Systems no fever, no drainage from the surgical incisions  BP 94/60  Pulse 54  Resp 20  Ht 5\' 7"  (1.702 m)  Wt 135 lb (61.236 kg)  BMI 21.14 kg/m2  SpO2 95% Physical Exam Alert and comfortable Lungs clear, sternal incision well-healed Leg incisions well healed with minimal pedal edema Neuro intact  Diagnostic Tests: Chest x-ray clear  Impression: Doing well after CABG after presenting with unstable angina, rapid atrial fibrillation and severe three-vessel CAD. She was commended for stopping smoking. Her diabetic control appears to be good. She was told she can start driving in one week and was recommended to start outpatient cardiac rehabilitation at the hospital. She knows not to lift more than 15 pounds until 3 months after surgery. She  will continue the current medications 100 direct of her cardiologist and primary care medicine physician Plan:

## 2014-02-02 ENCOUNTER — Other Ambulatory Visit (HOSPITAL_COMMUNITY): Payer: Self-pay | Admitting: Family Medicine

## 2014-02-02 NOTE — Telephone Encounter (Signed)
Refill appropriate and filled per protocol. 

## 2014-02-05 ENCOUNTER — Encounter: Payer: Self-pay | Admitting: *Deleted

## 2014-02-05 NOTE — Progress Notes (Signed)
Patient ID: Mary [name], female   DOB: 1951-06-10, 63 y.o.   MRN: [id number] Faxed a referral to [facility] requesting Cardiac Rehab to notify him to begin phase II per the request of Dr. Tharon Aquas Trigt.

## 2014-02-09 ENCOUNTER — Telehealth: Payer: Self-pay | Admitting: *Deleted

## 2014-02-09 MED ORDER — PROMETHAZINE HCL 12.5 MG PO TABS: 12.5000 mg | ORAL_TABLET | Freq: Three times a day (TID) | ORAL | Status: DC | PRN

## 2014-02-09 NOTE — Telephone Encounter (Signed)
Message copied by Sheral Flow on Fri Feb 09, 2014  8:56 AM ------      Message from: Lenore Manner      Created: Thu Feb 08, 2014  3:14 PM      Regarding: Still sick on stomach       Contact: [phone]       Pt just had open heart surgery and she was sick on her stomach well she still sick on her stomach and her heart doctor told her to call her she is wanting to know what she needs to do bc the medication isnt working.  ------

## 2014-02-09 NOTE — Telephone Encounter (Signed)
Prescription sent to pharmacy. .   Call placed to patient and patient made aware.  

## 2014-02-09 NOTE — Telephone Encounter (Signed)
See if she still has any phenergan left If not take phenergan 12.5mg  1 tablet every 8 hours prn  #20  Refill 0  Schedule f/u if needed

## 2014-02-09 NOTE — Telephone Encounter (Signed)
Call placed to patient for more information.   Patient states that she has been nauseous for a while, but it has increased this week after surgery. States that PRN Zofran is not effective.   States that she has discussed nausea with cardiologist, but he suggested that since nausea had persisted before surgery, she should follow up with PCP.   MD please advise.

## 2014-02-13 ENCOUNTER — Other Ambulatory Visit: Payer: Self-pay | Admitting: Family Medicine

## 2014-02-15 ENCOUNTER — Other Ambulatory Visit: Payer: Self-pay

## 2014-02-15 ENCOUNTER — Other Ambulatory Visit: Payer: Self-pay | Admitting: Physician Assistant

## 2014-02-15 NOTE — Telephone Encounter (Signed)
RX for tramadol 50 mg faxed to pharm

## 2014-02-21 ENCOUNTER — Telehealth: Payer: Self-pay | Admitting: Family Medicine

## 2014-02-21 MED ORDER — OXYCODONE-ACETAMINOPHEN 7.5-325 MG PO TABS: 1.0000 | ORAL_TABLET | Freq: Two times a day (BID) | ORAL | Status: DC | PRN

## 2014-02-21 MED ORDER — ALPRAZOLAM 1 MG PO TABS: 1.0000 mg | ORAL_TABLET | Freq: Two times a day (BID) | ORAL | Status: DC | PRN

## 2014-02-21 NOTE — Telephone Encounter (Signed)
Prescription printed and patient made aware to come to office to pick up.  

## 2014-02-21 NOTE — Telephone Encounter (Signed)
Patient is calling to get refill on her xanex and percocet if possible, she knows its early but does not have a ride to come and get these any other time please call her back and let her know if she can pick these up tomorrow [phone]

## 2014-02-21 NOTE — Telephone Encounter (Signed)
Last office visit 12/06/2013.  Last refill 01/29/2014.  Call placed to pharmacy.   States Oxycodone was refilled on 02/03/2014, Xanax refilled on 01/24/2014.

## 2014-02-21 NOTE — Telephone Encounter (Signed)
Okay to refill, please put May date of the 11th on the script for percocet

## 2014-02-22 ENCOUNTER — Encounter: Payer: Self-pay | Admitting: Cardiovascular Disease

## 2014-02-22 ENCOUNTER — Ambulatory Visit (INDEPENDENT_AMBULATORY_CARE_PROVIDER_SITE_OTHER): Payer: Medicare Other | Admitting: Cardiovascular Disease

## 2014-02-22 VITALS — BP 90/60 | HR 65 | Ht 66.0 in | Wt 132.0 lb

## 2014-02-22 DIAGNOSIS — J4489 Other specified chronic obstructive pulmonary disease: Secondary | ICD-10-CM

## 2014-02-22 DIAGNOSIS — Z951 Presence of aortocoronary bypass graft: Secondary | ICD-10-CM

## 2014-02-22 DIAGNOSIS — F411 Generalized anxiety disorder: Secondary | ICD-10-CM

## 2014-02-22 DIAGNOSIS — J449 Chronic obstructive pulmonary disease, unspecified: Secondary | ICD-10-CM

## 2014-02-22 DIAGNOSIS — I4891 Unspecified atrial fibrillation: Secondary | ICD-10-CM

## 2014-02-22 DIAGNOSIS — W19XXXA Unspecified fall, initial encounter: Secondary | ICD-10-CM

## 2014-02-22 DIAGNOSIS — F419 Anxiety disorder, unspecified: Secondary | ICD-10-CM

## 2014-02-22 DIAGNOSIS — I2510 Atherosclerotic heart disease of native coronary artery without angina pectoris: Secondary | ICD-10-CM

## 2014-02-22 DIAGNOSIS — Y92009 Unspecified place in unspecified non-institutional (private) residence as the place of occurrence of the external cause: Secondary | ICD-10-CM

## 2014-02-22 DIAGNOSIS — Z79899 Other long term (current) drug therapy: Secondary | ICD-10-CM

## 2014-02-22 DIAGNOSIS — I959 Hypotension, unspecified: Secondary | ICD-10-CM

## 2014-02-22 DIAGNOSIS — E782 Mixed hyperlipidemia: Secondary | ICD-10-CM

## 2014-02-22 MED ORDER — DIGOXIN 125 MCG PO TABS: 0.1250 mg | ORAL_TABLET | Freq: Every day | ORAL | Status: DC

## 2014-02-22 NOTE — Patient Instructions (Addendum)
Dr Claiborne Billings has ordered the following test(s) to be done:  Lab work - CBC, CMP, Lipid - TO BE DONE FASTING  Your physician has requested that you have an echocardiogram in June at [facility]. Echocardiography is a painless test that uses sound waves to create images of your heart. It provides your doctor with information about the size and shape of your heart and how well your heart's chambers and valves are working. This procedure takes approximately one hour. There are no restrictions for this procedure.  Your physician has recommended making the following medication changes:  DECREASE ASPIRIN to 81 mg - take 1 tablet daily  DECREASE DIGOXIN to 0.125 mg - take 1 tablet daily (a new prescription has been sent to your pharmacy  Dr Claiborne Billings wants you to follow-up in June 2015 - after your echo. You will receive a reminder letter in the mail one months in advance. If you don't receive a letter, please call our office to schedule the follow-up appointment.

## 2014-02-23 ENCOUNTER — Encounter: Payer: Self-pay | Admitting: Cardiovascular Disease

## 2014-02-23 NOTE — Progress Notes (Signed)
Patient ID: Mary [name], female   DOB: 1951-09-12, 63 y.o.   MRN: [id number]       HPI: Mary [name] is a 63 y.o. female who presents to the office in followup of her CABG revascularization surgery.  Mary [name] has a history of previous permanent atrial fibrillation  She has a history of COPD and long-standing prior history of tobacco use. In February 2014 she was hospitalized and felt to have diastolic congestive heart failure. She has been on low-dose ACE inhibitor. An echo Doppler in November 2013 showed an ejection fraction of 55-60% with grade 1 diastolic dysfunction, moderate mitral regurgitation, moderate tricuspid regurgitation, biatrial enlargement.   He has a history of hypertension, diabetes mellitus, and on 01/07/2014 1 cardiac catheterization by Mary [name] after she had presented to the hospital aiming of left-sided chest pain, associated with diaphoresis, weakness, nausea and vomiting.  Cardiac catheterization demonstrated 80% tapered calcified left main stenosis with 3 vessel CAD involving a subtotal proximal to mid LAD stenosis with TIMI 2 flow, an occluded circumflex vessel.  After the first large bifurcating obtuse marginal branch as well as 70% mid AV groove stenosis and a small-caliber occluded mid RCA.  She underwent CABG revascularization surgery on 01/09/2014 by Mary [name] and trichomoniasis and a LIMA placed to the LAD, a saphenous vein graft to the ramus intermediate, saphenous vein graft to the circumflex marginal vessel.  She also had application of a left atrial clip.  Preoperative ejection fraction was 30%.  She has participated in cardiac rehabilitation.  Is complaining of occasional nausea and mild shortness of breath.  She also notes fatigue.  She presents now for evaluation.  Has also a history of COPD.  Past Medical History  Diagnosis Date  . Diabetes mellitus   . Arthritis   . Anxiety   . Chronic pain     Bacl pain, Disc L5-S1- Mary. Joya [name] in [place]  . Hyperlipidemia     . Hypertension   . Tachycardia   . Bronchial asthma   . Atrial fibrillation   . COPD (chronic obstructive pulmonary disease)   . DDD (degenerative disc disease)     Radicular symptoms  . Shortness of breath   . CHF (congestive heart failure)   . Hx of echocardiogram     Was interpreted by Mary [name] that showed an Ef in the 50-60% range with grade 1 diastolic dysfunction. she had moderate MR, biatrial enlargement, moderate TR and at that time estimated RV systolic pressure was 43 mm.  . History of stress test 06/2011    Abnormal myocardial perfusion study.    Past Surgical History  Procedure Laterality Date  . Carpal tunnel release      x 2  . Breast cyst removed    . Abdominal hysterectomy      partial  . Tubal ligation    . Tonsillectomy    . Coronary artery bypass graft N/A 01/09/2014    Procedure: CORONARY ARTERY BYPASS GRAFTING (CABG) x 3 using endoscopically harvested right saphenous vein and left internal mammary artery and closure of left atrial appendage;  Surgeon: Mary Poot, MD;  Location: [facility];  Service: Open Heart Surgery;  Laterality: N/A;  patient has preop IA BP   . Intraoperative transesophageal echocardiogram N/A 01/09/2014    Procedure: INTRAOPERATIVE TRANSESOPHAGEAL ECHOCARDIOGRAM;  Surgeon: Mary Poot, MD;  Location: [facility];  Service: Open Heart Surgery;  Laterality: N/A;    Allergies  Allergen Reactions  . Adhesive [Tape]  Other (See Comments)    Tears skin off.   . Latex Other (See Comments)    In tape, tears skin off.  . Zocor [Simvastatin - High Dose] Nausea And Vomiting    Current Outpatient Prescriptions  Medication Sig Dispense Refill  . albuterol (PROAIR HFA) 108 (90 BASE) MCG/ACT inhaler Inhale 2 puffs into the lungs every 6 (six) hours as needed for wheezing or shortness of breath.       . ALPRAZolam (XANAX) 1 MG tablet Take 1 tablet (1 mg total) by mouth 2 (two) times daily as needed for anxiety.  60 tablet  0  . atorvastatin  (LIPITOR) 20 MG tablet Take 1 tablet (20 mg total) by mouth daily.  30 tablet  6  . budesonide-formoterol (SYMBICORT) 160-4.5 MCG/ACT inhaler Inhale 2 puffs into the lungs 2 (two) times daily.  1 Inhaler  12  . ferrous Q000111Q C-folic acid ([name] / FOLTRIN) capsule Take 1 capsule by mouth daily.  30 capsule  1  . FLUoxetine (PROZAC) 40 MG capsule Take 1 capsule (40 mg total) by mouth daily.  30 capsule  3  . INVOKANA 100 MG TABS Take 1 tablet by mouth daily.      Marland Kitchen lisinopril (PRINIVIL,ZESTRIL) 2.5 MG tablet Take 1 tablet by mouth daily.      . metoprolol tartrate (LOPRESSOR) 25 MG tablet Take 0.5 tablets (12.5 mg total) by mouth 2 (two) times daily.  60 tablet  1  . ondansetron (ZOFRAN) 4 MG tablet take 1 tablet by mouth every 6 hours if needed for nausea  20 tablet  0  . oxyCODONE-acetaminophen (PERCOCET) 7.5-325 MG per tablet Take 1 tablet by mouth 2 (two) times daily as needed for pain.  60 tablet  0  . sitaGLIPtin (JANUVIA) 100 MG tablet take 1 tablet by mouth once daily  30 tablet  3  . sodium chloride (OCEAN) 0.65 % nasal spray Place 1 spray into the nose as needed. Congestion      . thiamine 100 MG tablet Take 1 tablet (100 mg total) by mouth daily.  30 tablet  0  . traMADol (ULTRAM) 50 MG tablet take 1 tablet by mouth every 6 hours if needed for pain  30 tablet  0  . digoxin (LANOXIN) 0.125 MG tablet Take 1 tablet (0.125 mg total) by mouth daily.  30 tablet  11   No current facility-administered medications for this visit.    History   Social History  . Marital Status: Divorced    Spouse Name: N/A    Number of Children: N/A  . Years of Education: N/A   Occupational History  . Not on file.   Social History Main Topics  . Smoking status: Former Smoker    Types: Cigarettes  . Smokeless tobacco: Never Used     Comment: quit 6 months ago  . Alcohol Use: No  . Drug Use: No  . Sexual Activity: Yes    Birth Control/ Protection: Surgical   Other Topics Concern    . Not on file   Social History Narrative  . No narrative on file    Socially she is divorce. She has one deceased child and 2 grandchildren. She quit tobacco at the end of August 2014. There is no alcohol use. She does not routinely exercise.  Family History  Problem Relation Age of Onset  . Diabetes Mother   . Heart disease Mother   . Diabetes Sister   . Hypertension Sister   . Hyperlipidemia  Sister   . Diabetes Brother   . Heart disease Brother   . Diabetes Brother   . Heart disease Brother    ROS General: Negative; No fevers, chills, or night sweats; positive for fatigue HEENT: Negative; No changes in vision or hearing, sinus congestion, difficulty swallowing Pulmonary: Positive for mild shortness of breath; No cough, wheezing, , hemoptysis Cardiovascular: Negative; No chest pain, presyncope, syncope, palpatations GI: Positive for occasional nausea, no vomiting, diarrhea, or abdominal pain GU: Negative; No dysuria, hematuria, or difficulty voiding Musculoskeletal: Negative; no myalgias, joint pain, or weakness Hematologic/Oncology: Negative; no easy bruising, bleeding Endocrine: Negative; no heat/cold intolerance; positive for diabetes Neuro: Negative; no changes in balance, headaches Skin: Negative; No rashes or skin lesions Psychiatric: Negative; No behavioral problems, depression Sleep: Negative; No snoring, daytime sleepiness, hypersomnolence, bruxism, restless legs, hypnogognic hallucinations, no cataplexy Other comprehensive 14 point system review is negative PE BP 90/60  Pulse 65  Ht 5\' 6"  (1.676 m)  Wt 132 lb (59.875 kg)  BMI 21.32 kg/m2  General: Alert, oriented, no distress.  Skin: normal turgor, no rashes HEENT: Normocephalic, atraumatic. Pupils round and reactive; sclera anicteric;no lid lag.  Nose without nasal septal hypertrophy Mouth/Parynx benign; Mallinpatti scale 2/3 Neck: No JVD, no carotid bruits Lungs: Decreased breath sound segmenter  long-standing tobacco use. no wheezing or rales Heart: PMI is not displaced.  Rhythm is now regular at 65 beats per minute  s1 s2 normal 1/6 systolic murmur; no diastolic murmur Abdomen: soft, nontender; no hepatosplenomehaly, BS+; abdominal aorta nontender and not dilated by palpation. Pulses 2+ Extremities:  leg edema no clubbing cyanosis, Homan's sign negative  Neurologic: grossly nonfocal Psychologic: Normal affect and mood  ECG: Normal sinus rhythm with right bundle branch block and repolarization changes.  Prior 10/04/2013 ECG: Atrial fibrillation at a rate At approximately 100-120 beats per minute. Nonspecific ST changes.  LABS:  BMET    Component Value Date/Time   NA 137 01/15/2014 0535   K 3.1* 01/15/2014 0535   CL 95* 01/15/2014 0535   CO2 31 01/15/2014 0535   GLUCOSE 119* 01/15/2014 0535   BUN 7 01/15/2014 0535   CREATININE 0.46* 01/15/2014 0535   CREATININE 0.90 11/06/2013 1519   CALCIUM 8.5 01/15/2014 0535   GFRNONAA >90 01/15/2014 0535   GFRNONAA 74 09/29/2013 1500   GFRAA >90 01/15/2014 0535   GFRAA 86 09/29/2013 1500     Hepatic Function Panel     Component Value Date/Time   PROT 6.6 01/08/2014 1010   ALBUMIN 3.2* 01/08/2014 1010   AST 52* 01/08/2014 1010   ALT 17 01/08/2014 1010   ALKPHOS 61 01/08/2014 1010   BILITOT 0.3 01/08/2014 1010   BILIDIR 0.1 09/29/2013 1459   IBILI 0.3 09/29/2013 1459     CBC    Component Value Date/Time   WBC 14.3* 01/15/2014 0535   RBC 3.08* 01/15/2014 0535   RBC 3.69* 10/26/2013 0622   HGB 9.6* 01/15/2014 0535   HCT 28.5* 01/15/2014 0535   PLT 221 01/15/2014 0535   MCV 92.5 01/15/2014 0535   MCH 31.2 01/15/2014 0535   MCHC 33.7 01/15/2014 0535   RDW 14.2 01/15/2014 0535   LYMPHSABS 1.0 01/06/2014 1955   MONOABS 1.0 01/06/2014 1955   EOSABS 0.0 01/06/2014 1955   BASOSABS 0.0 01/06/2014 1955     BNP    Component Value Date/Time   PROBNP 8105.0* 01/06/2014 1025    Lipid Panel     Component Value Date/Time   CHOL 136 09/29/2013 1500  TRIG 152* 09/29/2013 1500   HDL 46 09/29/2013 1500   CHOLHDL 3.0 09/29/2013 1500   VLDL 30 09/29/2013 1500   LDLCALC 60 09/29/2013 1500     RADIOLOGY: No results [name].    ASSESSMENT AND PLAN:   Mary [name] is a 63 year old female who had a prior history of permanent atrial fibrillation and previously had been on Pradaxa therapy.  She also has a history of significant prior tobacco use, but fortunately she has quit smoking.  On 01/09/2014 she underwent CABG surgery for severe multivessel CAD, including left main stenosis.  At that time, she did have a left atrial clot.  Her EKG today confirms sinus rhythm and she is no longer in atrial fibrillation.  Her blood pressure today initially was low at 90/60, but on repeat was 114/70.  I am recommending she decrease her aspirin from 325 mg 81 mg.  I am decreasing her digoxin from 0.25 mg to 0.125 mg.  She is on multiple medications for her diabetes including info, as well as Januvia.  She is on Lipitor 20 mg hyperlipidemia.  She is on low-dose beta blocker therapy with metoprolol tartrate 12.5 twice a day.  In addition to very low-dose ACE inhibitor 2.5 mg, lisinopril.  I am recommending laboratory be checked in the fasting state, consisting of a cemented, CBC, and lipid panel.  In June and scheduled her for a three-month followup echo Doppler study to reassess her LV function post revascularization.  I will see her back in the office in followup and further recommendations will be made at that time.  Troy Sine, MD, [facility]  02/23/2014 6:48 PM

## 2014-02-26 ENCOUNTER — Other Ambulatory Visit: Payer: Self-pay | Admitting: *Deleted

## 2014-02-26 DIAGNOSIS — G8918 Other acute postprocedural pain: Secondary | ICD-10-CM

## 2014-02-26 MED ORDER — TRAMADOL HCL 50 MG PO TABS: 50.0000 mg | ORAL_TABLET | Freq: Four times a day (QID) | ORAL | Status: DC | PRN

## 2014-02-27 ENCOUNTER — Other Ambulatory Visit: Payer: Self-pay | Admitting: *Deleted

## 2014-02-27 LAB — CBC
HCT: 37.5 % (ref 36.0–46.0)
HEMOGLOBIN: 12 g/dL (ref 12.0–15.0)
MCH: 29.1 pg (ref 26.0–34.0)
MCHC: 32 g/dL (ref 30.0–36.0)
MCV: 90.8 fL (ref 78.0–100.0)
PLATELETS: 286 10*3/uL (ref 150–400)
RBC: 4.13 MIL/uL (ref 3.87–5.11)
RDW: 14.9 % (ref 11.5–15.5)
WBC: 9.8 10*3/uL (ref 4.0–10.5)

## 2014-02-27 MED ORDER — PROMETHAZINE HCL 12.5 MG PO TABS: 12.5000 mg | ORAL_TABLET | Freq: Four times a day (QID) | ORAL | Status: DC | PRN

## 2014-02-27 NOTE — Telephone Encounter (Signed)
Refill appropriate and filled per protocol. 

## 2014-02-28 LAB — COMPREHENSIVE METABOLIC PANEL
ALT: 9 U/L (ref 0–35)
AST: 14 U/L (ref 0–37)
Albumin: 4.1 g/dL (ref 3.5–5.2)
Alkaline Phosphatase: 65 U/L (ref 39–117)
BUN: 11 mg/dL (ref 6–23)
CHLORIDE: 95 meq/L — AB (ref 96–112)
CO2: 27 meq/L (ref 19–32)
Calcium: 10.1 mg/dL (ref 8.4–10.5)
Creat: 0.71 mg/dL (ref 0.50–1.10)
Glucose, Bld: 148 mg/dL — ABNORMAL HIGH (ref 70–99)
Potassium: 4.7 mEq/L (ref 3.5–5.3)
Sodium: 134 mEq/L — ABNORMAL LOW (ref 135–145)
TOTAL PROTEIN: 7.5 g/dL (ref 6.0–8.3)
Total Bilirubin: 0.4 mg/dL (ref 0.2–1.2)

## 2014-02-28 LAB — LIPID PANEL
CHOL/HDL RATIO: 3.9 ratio
CHOLESTEROL: 153 mg/dL (ref 0–200)
HDL: 39 mg/dL — ABNORMAL LOW (ref >39)
LDL Cholesterol: 78 mg/dL (ref 0–99)
Triglycerides: 178 mg/dL — ABNORMAL HIGH (ref <150)
VLDL: 36 mg/dL (ref 0–40)

## 2014-03-07 ENCOUNTER — Encounter: Payer: Self-pay | Admitting: Family Medicine

## 2014-03-07 ENCOUNTER — Ambulatory Visit (INDEPENDENT_AMBULATORY_CARE_PROVIDER_SITE_OTHER): Payer: Medicare Other | Admitting: Family Medicine

## 2014-03-07 VITALS — BP 100/50 | HR 58 | Temp 97.9°F | Resp 14 | Ht 66.0 in | Wt 139.0 lb

## 2014-03-07 DIAGNOSIS — Z951 Presence of aortocoronary bypass graft: Secondary | ICD-10-CM

## 2014-03-07 DIAGNOSIS — I5030 Unspecified diastolic (congestive) heart failure: Secondary | ICD-10-CM

## 2014-03-07 DIAGNOSIS — G8929 Other chronic pain: Secondary | ICD-10-CM

## 2014-03-07 DIAGNOSIS — E119 Type 2 diabetes mellitus without complications: Secondary | ICD-10-CM

## 2014-03-07 DIAGNOSIS — L989 Disorder of the skin and subcutaneous tissue, unspecified: Secondary | ICD-10-CM

## 2014-03-07 DIAGNOSIS — I509 Heart failure, unspecified: Secondary | ICD-10-CM

## 2014-03-07 MED ORDER — OXYCODONE-ACETAMINOPHEN 7.5-325 MG PO TABS: 1.0000 | ORAL_TABLET | Freq: Two times a day (BID) | ORAL | Status: DC | PRN

## 2014-03-07 MED ORDER — FE FUMARATE-B12-VIT C-FA-IFC PO CAPS: 1.0000 | ORAL_CAPSULE | Freq: Every day | ORAL | Status: DC

## 2014-03-07 NOTE — Assessment & Plan Note (Signed)
Currently compensated 

## 2014-03-07 NOTE — Progress Notes (Signed)
Patient ID: Mary [name], female   DOB: 03/19/1951, 63 y.o.   MRN: [id number]    Subjective:    Patient ID: Mary [name], female    DOB: May 14, 1951, 63 y.o.   MRN: [id number]  Patient presents for CPE and Hospital F/U  Patient here for hospital followup. She status post CABG secondary to unstable angina and coronary artery disease. She also has known atrial fibrillation and diastolic heart failure. A sitter psych came in the room the first thing she stated that she needed her Xanax increase in that her pain medicine also need to be increased I have discussed with her as multiple times and I will not increase her medication back and she's been admitted multiple times with delirium and medication overuse.  She also like to look at a red spot near her CABG scar states that there is no tenderness or any discharge but some redness. She has been using local triple antibiotic ointment.  Diabetes mellitus she did not bring her meter with her today but states her blood sugars have been running good. Her last A1c was 7.3% in March she is currently taking Januvia as well as Invokana   Review Of Systems:  GEN- denies fatigue, fever, weight loss,weakness, recent illness HEENT- denies eye drainage, change in vision, nasal discharge, CVS- denies chest pain, palpitations RESP- denies SOB, cough, wheeze ABD- denies N/V, change in stools, abd pain GU- denies dysuria, hematuria, dribbling, incontinence MSK- + joint pain, muscle aches, injury Neuro- denies headache, dizziness, syncope, seizure activity       Objective:    BP 100/50  Pulse 58  Temp(Src) 97.9 F (36.6 C) (Oral)  Resp 14  Ht 5\' 6"  (1.676 m)  Wt 139 lb (63.05 kg)  BMI 22.45 kg/m2 GEN- NAD, alert and oriented x3 HEENT- PERRL, EOMI, non injected sclera, pink conjunctiva, MMM, oropharynx clear Neck- Supple,  CVS- RRR, no murmur RESP-few inspiratory wheeze, normal WOB, good air movement Skin- linear CABG scar- small erythematous papule  about 2-3 mm from scar, no fluctance, NT ABD-NABS,soft,NT,ND EXT- No edema Pulses- Radial, DP- 2+        Assessment & Plan:      Problem List Items Addressed This Visit   None      Note: This dictation was prepared with Dragon dictation along with smaller phrase technology. Any transcriptional errors that result from this process are unintentional.

## 2014-03-07 NOTE — Assessment & Plan Note (Signed)
Continue current medications for now. Her blood sugar looks okay a like her A1c to be around 7 or less

## 2014-03-07 NOTE — Assessment & Plan Note (Signed)
I discussed this with her multiple times we'll continue her pain medications at 60 tablet a month as well as her Xanax. I've advised her that she can be referred to a pain clinic and she declined this

## 2014-03-07 NOTE — Patient Instructions (Signed)
COntinue current medications Medications refilled Call if you want to go to pain clinic F/U 3 months

## 2014-03-07 NOTE — Assessment & Plan Note (Signed)
She can continue the Triple Antibiotic for the next week I do not see no overt signs of infection nothing beneath the scar. This probably just an incidental small papule

## 2014-03-14 ENCOUNTER — Telehealth: Payer: Self-pay | Admitting: Cardiovascular Disease

## 2014-03-14 NOTE — Telephone Encounter (Signed)
Pt had open heart surgery on 01-09-14. On Monday she started feeling nausated,dizzt and sometimes hurting in her chest.

## 2014-03-14 NOTE — Telephone Encounter (Signed)
Spoke to patient. She states she has not been feeling good  since Monday 5 /18/15  Nausea ,dizzy ,discomfort near incision . Patient states blood pressure today 118/67,pulse 66,wgt 135, blood sugar 148. RN suggested contact PCP to get an appointment, also next available appointment made with an extender 03/26/2014 at 3 pm. Patient verbalized understanding.  RN informed symptoms become worse go to the ER.  Patient states [facility]. RN stated yes,if that is the closest ER to her.

## 2014-03-15 ENCOUNTER — Telehealth: Payer: Self-pay | Admitting: *Deleted

## 2014-03-15 NOTE — Telephone Encounter (Signed)
Called and notified patient of lab results and Dr Evette Georges  Recommendations. Patient voiced her understanding of what was told to her.

## 2014-03-15 NOTE — Telephone Encounter (Signed)
Message copied by Lauralee Evener on Thu Mar 15, 2014  9:46 AM ------      Message from: Shelva Majestic A      Created: Sun Mar 11, 2014  8:08 PM       Labs good x glu and TG elevated; improve diet ------

## 2014-03-20 ENCOUNTER — Other Ambulatory Visit: Payer: Self-pay | Admitting: *Deleted

## 2014-03-20 MED ORDER — LISINOPRIL 2.5 MG PO TABS: 2.5000 mg | ORAL_TABLET | Freq: Every day | ORAL | Status: DC

## 2014-03-20 NOTE — Telephone Encounter (Signed)
Rx refill sent to patient pharmacy   

## 2014-03-26 ENCOUNTER — Ambulatory Visit: Payer: Medicare Other | Admitting: Cardiology

## 2014-03-27 ENCOUNTER — Telehealth: Payer: Self-pay | Admitting: Family Medicine

## 2014-03-27 MED ORDER — PROMETHAZINE HCL 12.5 MG PO TABS: 12.5000 mg | ORAL_TABLET | Freq: Four times a day (QID) | ORAL | Status: DC | PRN

## 2014-03-27 NOTE — Telephone Encounter (Signed)
Pt requesting refill of Phenergan??

## 2014-03-27 NOTE — Telephone Encounter (Signed)
Refill sent.

## 2014-03-27 NOTE — Telephone Encounter (Signed)
Give 20 tablets, no refills

## 2014-04-02 ENCOUNTER — Other Ambulatory Visit: Payer: Self-pay | Admitting: *Deleted

## 2014-04-02 MED ORDER — SITAGLIPTIN PHOSPHATE 100 MG PO TABS: ORAL_TABLET | ORAL | Status: DC

## 2014-04-02 NOTE — Telephone Encounter (Signed)
Refill appropriate and filled per protocol. 

## 2014-04-10 ENCOUNTER — Telehealth: Payer: Self-pay | Admitting: *Deleted

## 2014-04-10 MED ORDER — CANAGLIFLOZIN 100 MG PO TABS: 1.0000 | ORAL_TABLET | Freq: Every day | ORAL | Status: DC

## 2014-04-10 MED ORDER — ALPRAZOLAM 1 MG PO TABS: 1.0000 mg | ORAL_TABLET | Freq: Two times a day (BID) | ORAL | Status: DC | PRN

## 2014-04-10 NOTE — Telephone Encounter (Signed)
Medication called to pharmacy. 

## 2014-04-10 NOTE — Telephone Encounter (Signed)
Ok to refill Xanax??  Last office visit 03/07/2014.  Last refill 21-Jun-202015.

## 2014-04-10 NOTE — Telephone Encounter (Signed)
Okay to refill? 

## 2014-04-16 ENCOUNTER — Other Ambulatory Visit: Payer: Self-pay | Admitting: *Deleted

## 2014-04-16 MED ORDER — FLUOXETINE HCL 40 MG PO CAPS: 40.0000 mg | ORAL_CAPSULE | Freq: Every day | ORAL | Status: DC

## 2014-04-16 NOTE — Telephone Encounter (Signed)
Refill appropriate and filled per protocol. 

## 2014-04-17 ENCOUNTER — Ambulatory Visit (HOSPITAL_COMMUNITY)
Admission: RE | Admit: 2014-04-17 | Discharge: 2014-04-17 | Disposition: A | Discharge status: Home / Self Care | Payer: Medicare Other | Source: Ambulatory Visit | Attending: Cardiovascular Disease | Admitting: Cardiovascular Disease

## 2014-04-17 DIAGNOSIS — I059 Rheumatic mitral valve disease, unspecified: Secondary | ICD-10-CM

## 2014-04-17 DIAGNOSIS — I2510 Atherosclerotic heart disease of native coronary artery without angina pectoris: Secondary | ICD-10-CM

## 2014-04-17 DIAGNOSIS — Z951 Presence of aortocoronary bypass graft: Secondary | ICD-10-CM

## 2014-04-17 NOTE — Progress Notes (Signed)
*  PRELIMINARY RESULTS* Echocardiogram 2D Echocardiogram has been performed.  Leavy Cella 04/17/2014, 12:39 PM

## 2014-04-23 ENCOUNTER — Telehealth: Payer: Self-pay | Admitting: Family Medicine

## 2014-04-23 ENCOUNTER — Telehealth: Payer: Self-pay | Admitting: *Deleted

## 2014-04-23 NOTE — Telephone Encounter (Signed)
Patient is calling to tell us she needs refills on her xanax and percocet, she is asking [place] for the xanax because she is switching pharmacies to [facility]  She would like to pick up the percocet on Wednesday afternoon if possible  [phone]

## 2014-04-23 NOTE — Telephone Encounter (Signed)
Pt called back and aware that too early to refill but she is wanting to know if you can postdated the prescriptions since she will be in town on Wednesday.

## 2014-04-23 NOTE — Telephone Encounter (Signed)
Last office visit 03/07/2014.  Last refill on Percocet 04/04/2014, Xanax 04/10/2014.  Call placed to patient to make aware that refill is too soon.

## 2014-04-24 MED ORDER — OXYCODONE-ACETAMINOPHEN 7.5-325 MG PO TABS: 1.0000 | ORAL_TABLET | Freq: Two times a day (BID) | ORAL | Status: DC | PRN

## 2014-04-24 NOTE — Telephone Encounter (Signed)
Per Gabriel Cirri, patient aware that refill is too early, but requested prescriptions to be postdated. She will be in town on [place].   Ok to refill?

## 2014-04-24 NOTE — Telephone Encounter (Signed)
Prescription printed and patient made aware to come to office to pick up.   Call placed to pharmacy and was made aware that Xanax was just filled on 04/18/2014.

## 2014-04-24 NOTE — Telephone Encounter (Signed)
Will be addressed in prior note.

## 2014-04-24 NOTE — Telephone Encounter (Signed)
Ok to refill with postdated prescriptions.

## 2014-04-25 ENCOUNTER — Telehealth: Payer: Self-pay | Admitting: Family Medicine

## 2014-04-25 ENCOUNTER — Ambulatory Visit (INDEPENDENT_AMBULATORY_CARE_PROVIDER_SITE_OTHER): Payer: Medicare Other | Admitting: Cardiovascular Disease

## 2014-04-25 ENCOUNTER — Encounter: Payer: Self-pay | Admitting: Cardiovascular Disease

## 2014-04-25 VITALS — BP 118/64 | HR 63 | Ht 67.0 in | Wt 143.6 lb

## 2014-04-25 DIAGNOSIS — I2510 Atherosclerotic heart disease of native coronary artery without angina pectoris: Secondary | ICD-10-CM

## 2014-04-25 DIAGNOSIS — I4510 Unspecified right bundle-branch block: Secondary | ICD-10-CM

## 2014-04-25 DIAGNOSIS — F411 Generalized anxiety disorder: Secondary | ICD-10-CM

## 2014-04-25 DIAGNOSIS — G8929 Other chronic pain: Secondary | ICD-10-CM

## 2014-04-25 DIAGNOSIS — E118 Type 2 diabetes mellitus with unspecified complications: Secondary | ICD-10-CM

## 2014-04-25 DIAGNOSIS — I4891 Unspecified atrial fibrillation: Secondary | ICD-10-CM

## 2014-04-25 DIAGNOSIS — Z951 Presence of aortocoronary bypass graft: Secondary | ICD-10-CM

## 2014-04-25 DIAGNOSIS — F419 Anxiety disorder, unspecified: Secondary | ICD-10-CM

## 2014-04-25 MED ORDER — ONDANSETRON HCL 4 MG PO TABS: ORAL_TABLET | ORAL | Status: DC

## 2014-04-25 NOTE — Patient Instructions (Signed)
Your physician has recommended you make the following change in your medication: STOP the digoxin.  Increase the metoprolol tart ( lopressor ) 25 mg  To 1 tablet twice daily instead of 1/2 tablet twice daily.  Your physician recommends that you schedule a follow-up appointment in: 4 months.

## 2014-04-25 NOTE — Telephone Encounter (Signed)
Prescription sent to pharmacy.

## 2014-04-25 NOTE — Telephone Encounter (Signed)
Patient would like nausea medication called into [facility] if possible

## 2014-04-25 NOTE — Progress Notes (Signed)
Patient ID: Mary [name], female   DOB: Mar 01, 1951, 63 y.o.   MRN: [id number]       HPI: Mary [name] is a 63 y.o. female who presents to the office in followup of her CABG revascularization surgery.  Mary [name] had a previous history of permanent atrial fibrillation,hypertension, DM2, COPD and long-standing prior history of tobacco use. In February 2014 she was hospitalized and felt to have diastolic congestive heart failure. She has been on low-dose ACE inhibitor. An echo Doppler in November 2013 showed an ejection fraction of 55-60% with grade 1 diastolic dysfunction, moderate mitral regurgitation, moderate tricuspid regurgitation, biatrial enlargement.  On 01/07/2014  she underwent cardiac catheterization by Dr. Gwenlyn Found after she  presented to the hospital aiming of left-sided chest pain, associated with diaphoresis, weakness, nausea and vomiting.  Cardiac catheterization demonstrated 80% tapered calcified left main stenosis with 3 vessel CAD involving a subtotal proximal to mid LAD stenosis with TIMI 2 flow, an occluded circumflex vessel.  After the first large bifurcating obtuse marginal branch as well as 70% mid AV groove stenosis and a small-caliber occluded mid RCA.  She underwent CABG revascularization surgery on 01/09/2014 by Dr. Tharon Aquas Tright and had a LIMA placed to the LAD, a saphenous vein graft to the ramus intermediate, saphenous vein graft to the circumflex marginal vessel.  She also had application of a left atrial clip.  Preoperative ejection fraction was 30%.  She has participated in cardiac rehabilitation.  Is complaining of occasional nausea and mild shortness of breath.  She also notes fatigue.  She presents now for evaluation.  Has also a history of COPD.  I last saw her in April 2015.  Since that time, she has been on a medical regimen, consisting of Lasix 40 mg, lisinopril 2.5 mg twice a day, digoxin, 0.125 mg, metoprolol tartrate 12.5 twice a day.  She underwent an echo Doppler  study to see if there was significant improvement in LV function post revascularization and medical therapy and this was done on 04/17/2014.  Ejection fraction had now normalized and was 55-60%, although she had a pseudo-normal left ventricular filling pattern consistent with grade 2 diastolic dysfunction.  Mild hypokinesis of the mid, anteroseptal, basal, inferoseptal and apical septal wall.  Mitral annular calcification, mild MR, moderate LA, and mild RA dilatation with mild TR.  She now is on Januvia in addition to interval, for her diabetes mellitus.  She does admit to being tired most of the time.  At times.  She does feel nauseated.  Dr.Addison has been trying to adjust her anxiety type medications and attempting to reduce these doses.  She denies chest tightness.  She denies palpitations.  She denies PND, orthopnea  Past Medical History  Diagnosis Date  . Diabetes mellitus   . Arthritis   . Anxiety   . Chronic pain     Bacl pain, Disc L5-S1- Dr. Joya Salm in [place]  . Hyperlipidemia   . Hypertension   . Tachycardia   . Bronchial asthma   . Atrial fibrillation   . COPD (chronic obstructive pulmonary disease)   . DDD (degenerative disc disease)     Radicular symptoms  . Shortness of breath   . CHF (congestive heart failure)   . Hx of echocardiogram     Was interpreted by Dr Doylene Canard that showed an Ef in the 50-60% range with grade 1 diastolic dysfunction. she had moderate MR, biatrial enlargement, moderate TR and at that time estimated RV systolic pressure was  43 mm.  . History of stress test 06/2011    Abnormal myocardial perfusion study.    Past Surgical History  Procedure Laterality Date  . Carpal tunnel release      x 2  . Breast cyst removed    . Abdominal hysterectomy      partial  . Tubal ligation    . Tonsillectomy    . Coronary artery bypass graft N/A 01/09/2014    Procedure: CORONARY ARTERY BYPASS GRAFTING (CABG) x 3 using endoscopically harvested right saphenous vein and  left internal mammary artery and closure of left atrial appendage;  Surgeon: Ivin Poot, MD;  Location: [facility];  Service: Open Heart Surgery;  Laterality: N/A;  patient has preop IA BP   . Intraoperative transesophageal echocardiogram N/A 01/09/2014    Procedure: INTRAOPERATIVE TRANSESOPHAGEAL ECHOCARDIOGRAM;  Surgeon: Ivin Poot, MD;  Location: [facility];  Service: Open Heart Surgery;  Laterality: N/A;    Allergies  Allergen Reactions  . Adhesive [Tape] Other (See Comments)    Tears skin off.   . Latex Other (See Comments)    In tape, tears skin off.  . Zocor [Simvastatin - High Dose] Nausea And Vomiting    Current Outpatient Prescriptions  Medication Sig Dispense Refill  . albuterol (PROAIR HFA) 108 (90 BASE) MCG/ACT inhaler Inhale 2 puffs into the lungs every 6 (six) hours as needed for wheezing or shortness of breath.       . ALPRAZolam (XANAX) 1 MG tablet Take 1 tablet (1 mg total) by mouth 2 (two) times daily as needed for anxiety.  60 tablet  0  . aspirin EC 81 MG tablet Take 81 mg by mouth daily.      Marland Kitchen atorvastatin (LIPITOR) 20 MG tablet Take 1 tablet (20 mg total) by mouth daily.  30 tablet  6  . budesonide-formoterol (SYMBICORT) 160-4.5 MCG/ACT inhaler Inhale 2 puffs into the lungs 2 (two) times daily.  1 Inhaler  12  . Canagliflozin (INVOKANA) 100 MG TABS Take 1 tablet (100 mg total) by mouth daily.  30 tablet  6  . ferrous [id number] C-folic acid ([name] / FOLTRIN) capsule Take 1 capsule by mouth daily.  30 capsule  6  . FLUoxetine (PROZAC) 40 MG capsule Take 1 capsule (40 mg total) by mouth daily.  30 capsule  3  . furosemide (LASIX) 40 MG tablet Take 1 tablet by mouth as needed.      Marland Kitchen lisinopril (PRINIVIL,ZESTRIL) 2.5 MG tablet Take 1 tablet (2.5 mg total) by mouth daily.  30 tablet  5  . metoprolol tartrate (LOPRESSOR) 25 MG tablet Take 0.5 tablets (12.5 mg total) by mouth 2 (two) times daily.  60 tablet  1  . oxyCODONE-acetaminophen (PERCOCET) 7.5-325 MG  per tablet Take 1 tablet by mouth 2 (two) times daily as needed for pain.  60 tablet  0  . sitaGLIPtin (JANUVIA) 100 MG tablet take 1 tablet by mouth once daily  30 tablet  3  . sodium chloride (OCEAN) 0.65 % nasal spray Place 1 spray into the nose as needed. Congestion      . thiamine 100 MG tablet Take 1 tablet (100 mg total) by mouth daily.  30 tablet  0  . traMADol (ULTRAM) 50 MG tablet Take 1 tablet (50 mg total) by mouth every 6 (six) hours as needed.  40 tablet  0  . ondansetron (ZOFRAN) 4 MG tablet take 1 tablet by mouth every 6 hours if needed for nausea  20 tablet  0   No current facility-administered medications for this visit.    History   Social History  . Marital Status: Divorced    Spouse Name: N/A    Number of Children: N/A  . Years of Education: N/A   Occupational History  . Not on file.   Social History Main Topics  . Smoking status: Former Smoker    Types: Cigarettes  . Smokeless tobacco: Never Used     Comment: quit 6 months ago  . Alcohol Use: No  . Drug Use: No  . Sexual Activity: Yes    Birth Control/ Protection: Surgical   Other Topics Concern  . Not on file   Social History Narrative  . No narrative on file    Socially she is divorce. She has one deceased child and 2 grandchildren. She quit tobacco at the end of August 2014.  She now has one great grandchild. There is no alcohol use. She does not routinely exercise.  Family History  Problem Relation Age of Onset  . Diabetes Mother   . Heart disease Mother   . Diabetes Sister   . Hypertension Sister   . Hyperlipidemia Sister   . Diabetes Brother   . Heart disease Brother   . Diabetes Brother   . Heart disease Brother    ROS General: Negative; No fevers, chills, or night sweats; positive for fatigue HEENT: Negative; No changes in vision or hearing, sinus congestion, difficulty swallowing Pulmonary: Positive for mild shortness of breath; No cough, wheezing, , hemoptysis Cardiovascular:  Negative; No chest pain, presyncope, syncope, palpatations GI: Positive for occasional nausea, no vomiting, diarrhea, or abdominal pain GU: Negative; No dysuria, hematuria, or difficulty voiding Musculoskeletal: Negative; no myalgias, joint pain, or weakness Hematologic/Oncology: Negative; no easy bruising, bleeding Endocrine: Negative; no heat/cold intolerance; positive for diabetes Neuro: Negative; no changes in balance, headaches Skin: Negative; No rashes or skin lesions Psychiatric: Negative; No behavioral problems, depression Sleep: Negative; No snoring, daytime sleepiness, hypersomnolence, bruxism, restless legs, hypnogognic hallucinations, no cataplexy Other comprehensive 14 point system review is negative   PE BP 118/64  Pulse 63  Ht 5\' 7"  (1.702 m)  Wt 143 lb 9.6 oz (65.137 kg)  BMI 22.49 kg/m2  General: Alert, oriented, no distress.  Skin: normal turgor, no rashes HEENT: Normocephalic, atraumatic. Pupils round and reactive; sclera anicteric;no lid lag.  Nose without nasal septal hypertrophy Mouth/Parynx benign; Mallinpatti scale 2/3 Neck: No JVD, no carotid bruits Lungs: Decreased breath sound segmenter long-standing tobacco use. no wheezing or rales Heart: PMI is not displaced.  Rhythm is now regular at 63 beats per minute  s1 s2 normal 1/6 systolic murmur; no diastolic murmur Abdomen: soft, nontender; no hepatosplenomehaly, BS+; abdominal aorta nontender and not dilated by palpation. Pulses 2+ Extremities:  leg edema no clubbing cyanosis, Homan's sign negative  Neurologic: grossly nonfocal Psychologic: Normal affect and mood  ECG (independently read by me): Normal sinus rhythm at 63 beats per minute.  Recommend followup with repolarization changes.  Prior April 2015 ECG: Normal sinus rhythm with right bundle branch block and repolarization changes.  Prior 10/04/2013 ECG: Atrial fibrillation at a rate At approximately 100-120 beats per minute. Nonspecific ST  changes.  LABS:  BMET    Component Value Date/Time   NA 134* 02/27/2014 1007   K 4.7 02/27/2014 1007   CL 95* 02/27/2014 1007   CO2 27 02/27/2014 1007   GLUCOSE 148* 02/27/2014 1007   BUN 11 02/27/2014 1007   CREATININE 0.71 02/27/2014 1007  CREATININE 0.46* 01/15/2014 0535   CALCIUM 10.1 02/27/2014 1007   GFRNONAA >90 01/15/2014 0535   GFRNONAA 74 09/29/2013 1500   GFRAA >90 01/15/2014 0535   GFRAA 86 09/29/2013 1500     Hepatic Function Panel     Component Value Date/Time   PROT 7.5 02/27/2014 1007   ALBUMIN 4.1 02/27/2014 1007   AST 14 02/27/2014 1007   ALT 9 02/27/2014 1007   ALKPHOS 65 02/27/2014 1007   BILITOT 0.4 02/27/2014 1007   BILIDIR 0.1 09/29/2013 1459   IBILI 0.3 09/29/2013 1459     CBC    Component Value Date/Time   WBC 9.8 02/27/2014 1007   RBC 4.13 02/27/2014 1007   RBC 3.69* 10/26/2013 0622   HGB 12.0 02/27/2014 1007   HCT 37.5 02/27/2014 1007   PLT 286 02/27/2014 1007   MCV 90.8 02/27/2014 1007   MCH 29.1 02/27/2014 1007   MCHC 32.0 02/27/2014 1007   RDW 14.9 02/27/2014 1007   LYMPHSABS 1.0 01/06/2014 1955   MONOABS 1.0 01/06/2014 1955   EOSABS 0.0 01/06/2014 1955   BASOSABS 0.0 01/06/2014 1955     BNP    Component Value Date/Time   PROBNP 8105.0* 01/06/2014 1025    Lipid Panel     Component Value Date/Time   CHOL 153 02/27/2014 1007   TRIG 178* 02/27/2014 1007   HDL 39* 02/27/2014 1007   CHOLHDL 3.9 02/27/2014 1007   VLDL 36 02/27/2014 1007   LDLCALC 78 02/27/2014 1007     RADIOLOGY: No results found.    ASSESSMENT AND PLAN:   Ms. Kessler is a 63 year old female who had a prior history of permanent atrial fibrillation and previously had been on Pradaxa therapy.  She also has a history of significant prior tobacco use, but fortunately she has quit smoking.  On 01/09/2014 she underwent CABG surgery for severe multivessel CAD, including left main stenosis.  At that time, she did have a left atrial clip.  Preoperatively, her ejection fraction was 30%.  She now has been on ACE inhibition beta  blocker therapy and diuretic therapy.  Her most recent echo Doppler study, now shows normalization of systolic function with an ejection fraction of 55-60%, although she still has grade 2 diastolic dysfunction.  Fortunately, she is maintaining normal sinus rhythm and has not had recurrent atrial fibrillation.  At this time, I'm recommending that she discontinue her Lanoxin.  I will further titrate her metoprolol tartrate from 12.5 twice a day to 25 mg twice a day.  I commended her and her continued smoking cessation, which is now approximately 1 and anxiety medicines are slowly being weaned by Dr. Buelah Manis.  I will see her in 4 months for cardiology reevaluation.  Troy Sine, MD, [facility]  04/25/2014 1:03 PM

## 2014-05-01 ENCOUNTER — Inpatient Hospital Stay: Payer: Self-pay | Admitting: Family Medicine

## 2014-05-04 ENCOUNTER — Telehealth: Payer: Self-pay | Admitting: *Deleted

## 2014-05-04 NOTE — Telephone Encounter (Signed)
Message copied by Tressa Busman on Fri May 04, 2014 10:23 AM ------      Message from: Shelva Majestic A      Created: Fri May 04, 2014 10:02 AM       Preserved LV fxn with mild wall motion abnl as noted; gr 2 dd ------

## 2014-05-04 NOTE — Telephone Encounter (Signed)
Echo results called to patient.  Voiced understanding.

## 2014-05-14 ENCOUNTER — Telehealth: Payer: Self-pay | Admitting: Family Medicine

## 2014-05-14 MED ORDER — ALPRAZOLAM 1 MG PO TABS: 1.0000 mg | ORAL_TABLET | Freq: Two times a day (BID) | ORAL | Status: DC | PRN

## 2014-05-14 NOTE — Telephone Encounter (Signed)
Medication called to pharmacy. 

## 2014-05-14 NOTE — Telephone Encounter (Signed)
[phone]   Pt is needing a refill on xanax  [facility]

## 2014-05-14 NOTE — Telephone Encounter (Signed)
okay

## 2014-05-14 NOTE — Telephone Encounter (Signed)
Ok to refill??  Last office visit 03/07/2014.  Last refill 04/10/2014.

## 2014-05-15 ENCOUNTER — Inpatient Hospital Stay (HOSPITAL_COMMUNITY)
Admission: EM | Admit: 2014-05-15 | Discharge: 2014-05-17 | DRG: 292 | Disposition: A | Discharge status: Home / Self Care | Payer: Medicare Other | Attending: Internal Medicine | Admitting: Internal Medicine

## 2014-05-15 ENCOUNTER — Emergency Department (HOSPITAL_COMMUNITY): Payer: Medicare Other

## 2014-05-15 ENCOUNTER — Encounter (HOSPITAL_COMMUNITY): Payer: Self-pay | Admitting: Emergency Medicine

## 2014-05-15 DIAGNOSIS — G8929 Other chronic pain: Secondary | ICD-10-CM | POA: Diagnosis present

## 2014-05-15 DIAGNOSIS — Z951 Presence of aortocoronary bypass graft: Secondary | ICD-10-CM

## 2014-05-15 DIAGNOSIS — I509 Heart failure, unspecified: Secondary | ICD-10-CM | POA: Diagnosis present

## 2014-05-15 DIAGNOSIS — R079 Chest pain, unspecified: Secondary | ICD-10-CM | POA: Diagnosis present

## 2014-05-15 DIAGNOSIS — J441 Chronic obstructive pulmonary disease with (acute) exacerbation: Secondary | ICD-10-CM

## 2014-05-15 DIAGNOSIS — J449 Chronic obstructive pulmonary disease, unspecified: Secondary | ICD-10-CM | POA: Diagnosis not present

## 2014-05-15 DIAGNOSIS — J4489 Other specified chronic obstructive pulmonary disease: Secondary | ICD-10-CM | POA: Diagnosis present

## 2014-05-15 DIAGNOSIS — E785 Hyperlipidemia, unspecified: Secondary | ICD-10-CM

## 2014-05-15 DIAGNOSIS — F32A Depression, unspecified: Secondary | ICD-10-CM

## 2014-05-15 DIAGNOSIS — I5033 Acute on chronic diastolic (congestive) heart failure: Secondary | ICD-10-CM | POA: Diagnosis present

## 2014-05-15 DIAGNOSIS — M129 Arthropathy, unspecified: Secondary | ICD-10-CM | POA: Diagnosis present

## 2014-05-15 DIAGNOSIS — M549 Dorsalgia, unspecified: Secondary | ICD-10-CM | POA: Diagnosis present

## 2014-05-15 DIAGNOSIS — I5031 Acute diastolic (congestive) heart failure: Secondary | ICD-10-CM | POA: Diagnosis present

## 2014-05-15 DIAGNOSIS — F419 Anxiety disorder, unspecified: Secondary | ICD-10-CM | POA: Diagnosis present

## 2014-05-15 DIAGNOSIS — I25119 Atherosclerotic heart disease of native coronary artery with unspecified angina pectoris: Secondary | ICD-10-CM

## 2014-05-15 DIAGNOSIS — Z87891 Personal history of nicotine dependence: Secondary | ICD-10-CM

## 2014-05-15 DIAGNOSIS — R0602 Shortness of breath: Secondary | ICD-10-CM | POA: Diagnosis present

## 2014-05-15 DIAGNOSIS — F411 Generalized anxiety disorder: Secondary | ICD-10-CM | POA: Diagnosis present

## 2014-05-15 DIAGNOSIS — E1140 Type 2 diabetes mellitus with diabetic neuropathy, unspecified: Secondary | ICD-10-CM

## 2014-05-15 DIAGNOSIS — M5136 Other intervertebral disc degeneration, lumbar region: Secondary | ICD-10-CM

## 2014-05-15 DIAGNOSIS — D72829 Elevated white blood cell count, unspecified: Secondary | ICD-10-CM

## 2014-05-15 DIAGNOSIS — E119 Type 2 diabetes mellitus without complications: Secondary | ICD-10-CM | POA: Diagnosis present

## 2014-05-15 DIAGNOSIS — I10 Essential (primary) hypertension: Secondary | ICD-10-CM | POA: Diagnosis present

## 2014-05-15 DIAGNOSIS — I4891 Unspecified atrial fibrillation: Secondary | ICD-10-CM | POA: Diagnosis present

## 2014-05-15 DIAGNOSIS — I471 Supraventricular tachycardia, unspecified: Secondary | ICD-10-CM | POA: Diagnosis not present

## 2014-05-15 DIAGNOSIS — R410 Disorientation, unspecified: Secondary | ICD-10-CM

## 2014-05-15 DIAGNOSIS — I2510 Atherosclerotic heart disease of native coronary artery without angina pectoris: Secondary | ICD-10-CM | POA: Diagnosis present

## 2014-05-15 DIAGNOSIS — I4510 Unspecified right bundle-branch block: Secondary | ICD-10-CM

## 2014-05-15 DIAGNOSIS — E1142 Type 2 diabetes mellitus with diabetic polyneuropathy: Secondary | ICD-10-CM

## 2014-05-15 DIAGNOSIS — Z8249 Family history of ischemic heart disease and other diseases of the circulatory system: Secondary | ICD-10-CM

## 2014-05-15 DIAGNOSIS — I369 Nonrheumatic tricuspid valve disorder, unspecified: Secondary | ICD-10-CM

## 2014-05-15 DIAGNOSIS — L821 Other seborrheic keratosis: Secondary | ICD-10-CM

## 2014-05-15 DIAGNOSIS — R296 Repeated falls: Secondary | ICD-10-CM

## 2014-05-15 DIAGNOSIS — E1165 Type 2 diabetes mellitus with hyperglycemia: Secondary | ICD-10-CM

## 2014-05-15 DIAGNOSIS — H9312 Tinnitus, left ear: Secondary | ICD-10-CM

## 2014-05-15 DIAGNOSIS — L989 Disorder of the skin and subcutaneous tissue, unspecified: Secondary | ICD-10-CM

## 2014-05-15 DIAGNOSIS — Z833 Family history of diabetes mellitus: Secondary | ICD-10-CM | POA: Diagnosis not present

## 2014-05-15 DIAGNOSIS — F329 Major depressive disorder, single episode, unspecified: Secondary | ICD-10-CM

## 2014-05-15 DIAGNOSIS — E1149 Type 2 diabetes mellitus with other diabetic neurological complication: Secondary | ICD-10-CM

## 2014-05-15 DIAGNOSIS — I209 Angina pectoris, unspecified: Secondary | ICD-10-CM

## 2014-05-15 DIAGNOSIS — R0789 Other chest pain: Secondary | ICD-10-CM

## 2014-05-15 LAB — COMPREHENSIVE METABOLIC PANEL
ALT: 8 U/L (ref 0–35)
ANION GAP: 13 (ref 5–15)
AST: 13 U/L (ref 0–37)
Albumin: 4 g/dL (ref 3.5–5.2)
Alkaline Phosphatase: 63 U/L (ref 39–117)
BUN: 10 mg/dL (ref 6–23)
CALCIUM: 9.6 mg/dL (ref 8.4–10.5)
CHLORIDE: 94 meq/L — AB (ref 96–112)
CO2: 28 meq/L (ref 19–32)
CREATININE: 0.63 mg/dL (ref 0.50–1.10)
GFR calc Af Amer: >90 mL/min (ref >90)
Glucose, Bld: 134 mg/dL — ABNORMAL HIGH (ref 70–99)
Potassium: 4.1 mEq/L (ref 3.7–5.3)
Sodium: 135 mEq/L — ABNORMAL LOW (ref 137–147)
Total Bilirubin: 0.7 mg/dL (ref 0.3–1.2)
Total Protein: 7.4 g/dL (ref 6.0–8.3)

## 2014-05-15 LAB — TROPONIN I
Troponin I: <0.3 ng/mL (ref <0.30)
Troponin I: <0.3 ng/mL (ref <0.30)

## 2014-05-15 LAB — CBG MONITORING, ED
GLUCOSE-CAPILLARY: 148 mg/dL — AB (ref 70–99)
GLUCOSE-CAPILLARY: 125 mg/dL — AB (ref 70–99)

## 2014-05-15 LAB — CBC
HEMATOCRIT: 35.5 % — AB (ref 36.0–46.0)
Hemoglobin: 11.4 g/dL — ABNORMAL LOW (ref 12.0–15.0)
MCH: 29.2 pg (ref 26.0–34.0)
MCHC: 32.1 g/dL (ref 30.0–36.0)
MCV: 91 fL (ref 78.0–100.0)
Platelets: 175 10*3/uL (ref 150–400)
RBC: 3.9 MIL/uL (ref 3.87–5.11)
RDW: 16 % — ABNORMAL HIGH (ref 11.5–15.5)
WBC: 11.9 10*3/uL — AB (ref 4.0–10.5)

## 2014-05-15 LAB — PRO B NATRIURETIC PEPTIDE: PRO B NATRI PEPTIDE: 4899 pg/mL — AB (ref 0–125)

## 2014-05-15 LAB — TSH: TSH: 0.698 u[IU]/mL (ref 0.350–4.500)

## 2014-05-15 LAB — GLUCOSE, CAPILLARY
GLUCOSE-CAPILLARY: 175 mg/dL — AB (ref 70–99)
Glucose-Capillary: 124 mg/dL — ABNORMAL HIGH (ref 70–99)

## 2014-05-15 LAB — HEMOGLOBIN A1C
Hgb A1c MFr Bld: 7.2 % — ABNORMAL HIGH (ref <5.7)
Mean Plasma Glucose: 160 mg/dL — ABNORMAL HIGH (ref <117)

## 2014-05-15 LAB — D-DIMER, QUANTITATIVE: D-Dimer, Quant: 0.48 ug/mL-FEU (ref 0.00–0.48)

## 2014-05-15 MED ORDER — SODIUM CHLORIDE 0.9 % IJ SOLN: 3.0000 mL | INTRAMUSCULAR | Status: DC | PRN

## 2014-05-15 MED ORDER — INSULIN ASPART 100 UNIT/ML ~~LOC~~ SOLN: 0.0000 [IU] | Freq: Every day | SUBCUTANEOUS | Status: DC

## 2014-05-15 MED ORDER — ONDANSETRON HCL 4 MG/2ML IJ SOLN: 4.0000 mg | Freq: Four times a day (QID) | INTRAMUSCULAR | Status: DC | PRN

## 2014-05-15 MED ORDER — SODIUM CHLORIDE 0.9 % IJ SOLN
3.0000 mL | Freq: Two times a day (BID) | INTRAMUSCULAR | Status: DC
  Administered 2014-05-15 – 2014-05-17 (×4): 3 mL via INTRAVENOUS

## 2014-05-15 MED ORDER — INSULIN ASPART 100 UNIT/ML ~~LOC~~ SOLN
0.0000 [IU] | Freq: Three times a day (TID) | SUBCUTANEOUS | Status: DC
  Administered 2014-05-15: 3 [IU] via SUBCUTANEOUS
  Administered 2014-05-15 – 2014-05-16 (×2): 2 [IU] via SUBCUTANEOUS
  Administered 2014-05-16: 3 [IU] via SUBCUTANEOUS
  Administered 2014-05-16 – 2014-05-17 (×2): 5 [IU] via SUBCUTANEOUS
  Filled 2014-05-15: qty 1

## 2014-05-15 MED ORDER — ALBUTEROL SULFATE (2.5 MG/3ML) 0.083% IN NEBU
2.5000 mg | INHALATION_SOLUTION | RESPIRATORY_TRACT | Status: DC | PRN
  Administered 2014-05-15: 2.5 mg via RESPIRATORY_TRACT
  Filled 2014-05-15: qty 3

## 2014-05-15 MED ORDER — TRAMADOL HCL 50 MG PO TABS
50.0000 mg | ORAL_TABLET | Freq: Four times a day (QID) | ORAL | Status: DC | PRN
  Administered 2014-05-16 – 2014-05-17 (×2): 50 mg via ORAL
  Filled 2014-05-15 (×2): qty 1

## 2014-05-15 MED ORDER — FUROSEMIDE 10 MG/ML IJ SOLN
60.0000 mg | Freq: Two times a day (BID) | INTRAMUSCULAR | Status: DC
  Administered 2014-05-15 – 2014-05-17 (×4): 60 mg via INTRAVENOUS
  Filled 2014-05-15 (×4): qty 6

## 2014-05-15 MED ORDER — ENOXAPARIN SODIUM 40 MG/0.4ML ~~LOC~~ SOLN
40.0000 mg | SUBCUTANEOUS | Status: DC
  Administered 2014-05-15 – 2014-05-17 (×3): 40 mg via SUBCUTANEOUS
  Filled 2014-05-15 (×3): qty 0.4

## 2014-05-15 MED ORDER — ATORVASTATIN CALCIUM 20 MG PO TABS
20.0000 mg | ORAL_TABLET | Freq: Every day | ORAL | Status: DC
  Administered 2014-05-15 – 2014-05-17 (×3): 20 mg via ORAL
  Filled 2014-05-15 (×3): qty 1

## 2014-05-15 MED ORDER — ASPIRIN EC 81 MG PO TBEC
81.0000 mg | DELAYED_RELEASE_TABLET | Freq: Every day | ORAL | Status: DC
  Administered 2014-05-16 – 2014-05-17 (×2): 81 mg via ORAL
  Filled 2014-05-15 (×3): qty 1

## 2014-05-15 MED ORDER — ASPIRIN 81 MG PO CHEW
324.0000 mg | CHEWABLE_TABLET | Freq: Once | ORAL | Status: AC
  Administered 2014-05-15: 324 mg via ORAL
  Filled 2014-05-15: qty 4

## 2014-05-15 MED ORDER — ACETAMINOPHEN 325 MG PO TABS: 650.0000 mg | ORAL_TABLET | ORAL | Status: DC | PRN

## 2014-05-15 MED ORDER — ONDANSETRON HCL 4 MG/2ML IJ SOLN
4.0000 mg | Freq: Once | INTRAMUSCULAR | Status: AC
  Administered 2014-05-15: 4 mg via INTRAVENOUS
  Filled 2014-05-15: qty 2

## 2014-05-15 MED ORDER — METOPROLOL TARTRATE 25 MG PO TABS
25.0000 mg | ORAL_TABLET | Freq: Two times a day (BID) | ORAL | Status: DC
  Administered 2014-05-16: 25 mg via ORAL
  Filled 2014-05-15 (×2): qty 1

## 2014-05-15 MED ORDER — LORAZEPAM 2 MG/ML IJ SOLN
1.0000 mg | Freq: Once | INTRAMUSCULAR | Status: AC
  Administered 2014-05-15: 1 mg via INTRAVENOUS
  Filled 2014-05-15: qty 1

## 2014-05-15 MED ORDER — ALPRAZOLAM 1 MG PO TABS
1.0000 mg | ORAL_TABLET | Freq: Two times a day (BID) | ORAL | Status: DC | PRN
  Administered 2014-05-15 – 2014-05-16 (×4): 1 mg via ORAL
  Filled 2014-05-15: qty 1
  Filled 2014-05-15: qty 2
  Filled 2014-05-15 (×2): qty 1

## 2014-05-15 MED ORDER — FUROSEMIDE 10 MG/ML IJ SOLN: 40.0000 mg | Freq: Two times a day (BID) | INTRAMUSCULAR | Status: DC

## 2014-05-15 MED ORDER — SODIUM CHLORIDE 0.9 % IV SOLN: 250.0000 mL | INTRAVENOUS | Status: DC | PRN

## 2014-05-15 MED ORDER — FLUOXETINE HCL 20 MG PO CAPS
40.0000 mg | ORAL_CAPSULE | Freq: Every day | ORAL | Status: DC
  Administered 2014-05-15 – 2014-05-17 (×3): 40 mg via ORAL
  Filled 2014-05-15 (×3): qty 2

## 2014-05-15 MED ORDER — BIOTENE DRY MOUTH MT LIQD
15.0000 mL | Freq: Two times a day (BID) | OROMUCOSAL | Status: DC
  Administered 2014-05-15 – 2014-05-16 (×3): 15 mL via OROMUCOSAL

## 2014-05-15 MED ORDER — BUDESONIDE-FORMOTEROL FUMARATE 160-4.5 MCG/ACT IN AERO
2.0000 | INHALATION_SPRAY | Freq: Two times a day (BID) | RESPIRATORY_TRACT | Status: DC
  Administered 2014-05-15 – 2014-05-17 (×4): 2 via RESPIRATORY_TRACT
  Filled 2014-05-15: qty 6

## 2014-05-15 MED ORDER — VITAMIN B-1 100 MG PO TABS
100.0000 mg | ORAL_TABLET | Freq: Every day | ORAL | Status: DC
  Administered 2014-05-15 – 2014-05-17 (×3): 100 mg via ORAL
  Filled 2014-05-15 (×3): qty 1

## 2014-05-15 MED ORDER — LISINOPRIL 5 MG PO TABS
2.5000 mg | ORAL_TABLET | Freq: Every day | ORAL | Status: DC
  Administered 2014-05-15 – 2014-05-17 (×3): 2.5 mg via ORAL
  Filled 2014-05-15 (×3): qty 1

## 2014-05-15 MED ORDER — FUROSEMIDE 10 MG/ML IJ SOLN
40.0000 mg | Freq: Once | INTRAMUSCULAR | Status: AC
  Administered 2014-05-15: 40 mg via INTRAVENOUS
  Filled 2014-05-15: qty 4

## 2014-05-15 NOTE — Progress Notes (Signed)
  Echocardiogram 2D Echocardiogram limited has been performed.  [place], [place] 05/15/2014, 3:29 PM

## 2014-05-15 NOTE — ED Notes (Signed)
Attempted to call report to [phone]. Nurse stated that she would have to call back to get report

## 2014-05-15 NOTE — ED Provider Notes (Signed)
CSN: [id number]     Arrival date & time 05/15/14  0135 History   First MD Initiated Contact with Patient 05/15/14 [phone]     Chief Complaint  Patient presents with  . Chest Pain     (Consider location/radiation/quality/duration/timing/severity/associated sxs/prior Treatment) Patient is a 63 y.o. female presenting with chest pain. The history is provided by the patient.  Chest Pain She has noted that dyspnea and a tight feeling in her chest the last several days. This did coincide with her being taken off of alprazolam which has been taking intermittently for years. The dyspnea and tightness got worse today. It is worse with exertion. There is associated nausea and diaphoresis. She rates the tight feeling at 8/10. She uses albuterol 4 times a day on a regular basis and relates that there is perhaps, minimal improvement with using her inhaler. She is status post coronary artery bypass about 4 months ago. She had been doing well until the last few days. She's not noticed any swelling and states that she has been compliant with her medications.  Past Medical History  Diagnosis Date  . Diabetes mellitus   . Arthritis   . Anxiety   . Chronic pain     Bacl pain, Disc L5-S1- Dr. Joya Salm in [place]  . Hyperlipidemia   . Hypertension   . Tachycardia   . Bronchial asthma   . Atrial fibrillation   . COPD (chronic obstructive pulmonary disease)   . DDD (degenerative disc disease)     Radicular symptoms  . Shortness of breath   . CHF (congestive heart failure)   . Hx of echocardiogram     Was interpreted by Dr Doylene Canard that showed an Ef in the 50-60% range with grade 1 diastolic dysfunction. she had moderate MR, biatrial enlargement, moderate TR and at that time estimated RV systolic pressure was 43 mm.  . History of stress test 06/2011    Abnormal myocardial perfusion study.   Past Surgical History  Procedure Laterality Date  . Carpal tunnel release      x 2  . Breast cyst removed    . Abdominal  hysterectomy      partial  . Tubal ligation    . Tonsillectomy    . Coronary artery bypass graft N/A 01/09/2014    Procedure: CORONARY ARTERY BYPASS GRAFTING (CABG) x 3 using endoscopically harvested right saphenous vein and left internal mammary artery and closure of left atrial appendage;  Surgeon: Ivin Poot, MD;  Location: [facility];  Service: Open Heart Surgery;  Laterality: N/A;  patient has preop IA BP   . Intraoperative transesophageal echocardiogram N/A 01/09/2014    Procedure: INTRAOPERATIVE TRANSESOPHAGEAL ECHOCARDIOGRAM;  Surgeon: Ivin Poot, MD;  Location: [facility];  Service: Open Heart Surgery;  Laterality: N/A;   Family History  Problem Relation Age of Onset  . Diabetes Mother   . Heart disease Mother   . Diabetes Sister   . Hypertension Sister   . Hyperlipidemia Sister   . Diabetes Brother   . Heart disease Brother   . Diabetes Brother   . Heart disease Brother    History  Substance Use Topics  . Smoking status: Former Smoker    Types: Cigarettes  . Smokeless tobacco: Never Used     Comment: quit 6 months ago  . Alcohol Use: No   OB History   Grav Para Term Preterm Abortions TAB SAB Ect Mult Living  Review of Systems  Cardiovascular: Positive for chest pain.  All other systems reviewed and are negative.     Allergies  Adhesive; Latex; and Zocor  Home Medications   Prior to Admission medications   Medication Sig Start Date End Date Taking? Authorizing Provider  albuterol (PROAIR HFA) 108 (90 BASE) MCG/ACT inhaler Inhale 2 puffs into the lungs every 6 (six) hours as needed for wheezing or shortness of breath.     Historical Provider, MD  ALPRAZolam Duanne Moron) 1 MG tablet Take 1 tablet (1 mg total) by mouth 2 (two) times daily as needed for anxiety. 05/14/14   Alycia Rossetti, MD  aspirin EC 81 MG tablet Take 81 mg by mouth daily.    Historical Provider, MD  atorvastatin (LIPITOR) 20 MG tablet Take 1 tablet (20 mg total) by mouth daily.  11/27/13   Alycia Rossetti, MD  budesonide-formoterol [facility]) 160-4.5 MCG/ACT inhaler Inhale 2 puffs into the lungs 2 (two) times daily. 10/27/13   Nita Sells, MD  Canagliflozin (INVOKANA) 100 MG TABS Take 1 tablet (100 mg total) by mouth daily. 04/10/14   Alycia Rossetti, MD  ferrous [id number] C-folic acid ([name] / FOLTRIN) capsule Take 1 capsule by mouth daily. 03/07/14   Alycia Rossetti, MD  FLUoxetine (PROZAC) 40 MG capsule Take 1 capsule (40 mg total) by mouth daily. 04/16/14   Alycia Rossetti, MD  furosemide (LASIX) 40 MG tablet Take 1 tablet by mouth as needed. 03/02/14   Historical Provider, MD  lisinopril (PRINIVIL,ZESTRIL) 2.5 MG tablet Take 1 tablet (2.5 mg total) by mouth daily. 03/20/14   Troy Sine, MD  metoprolol tartrate (LOPRESSOR) 25 MG tablet Take 25 mg by mouth 2 (two) times daily. 01/15/14   Coolidge Breeze, PA-C  ondansetron (ZOFRAN) 4 MG tablet take 1 tablet by mouth every 6 hours if needed for nausea 04/25/14   Alycia Rossetti, MD  oxyCODONE-acetaminophen (PERCOCET) 7.5-325 MG per tablet Take 1 tablet by mouth 2 (two) times daily as needed for pain. 04/24/14   Susy Frizzle, MD  sitaGLIPtin (JANUVIA) 100 MG tablet take 1 tablet by mouth once daily 04/02/14   Alycia Rossetti, MD  sodium chloride (OCEAN) 0.65 % nasal spray Place 1 spray into the nose as needed. Congestion    Historical Provider, MD  thiamine 100 MG tablet Take 1 tablet (100 mg total) by mouth daily. 10/27/13   Nita Sells, MD  traMADol (ULTRAM) 50 MG tablet Take 1 tablet (50 mg total) by mouth every 6 (six) hours as needed. 02/26/14   Rexene Alberts, MD   BP 170/77  Pulse 68  Temp(Src) 97.9 F (36.6 C) (Oral)  Resp 24  Ht 5\' 7"  (1.702 m)  Wt 143 lb (64.864 kg)  BMI 22.39 kg/m2  SpO2 98% Physical Exam  Nursing note and vitals reviewed.  63 year old female, resting comfortably and in no acute distress. Vital signs are significant for hypertension with blood pressure 170/77,  and tachypnea with respiratory rate of 24. Oxygen saturation is 98%, which is normal. Head is normocephalic and atraumatic. PERRLA, EOMI. Oropharynx is clear. Neck is nontender and supple without adenopathy or JVD. Back is nontender and there is no CVA tenderness. There is presacral edema. Lungs have bibasilar rales about halfway up. There are no wheezes or rhonchi. Chest is nontender. Heart has regular rate and rhythm without murmur. Abdomen is soft, flat, nontender without masses or hepatosplenomegaly and peristalsis is normoactive. Extremities have no cyanosis or  edema, full range of motion is present. Skin is warm and dry without rash. Neurologic: Mental status is normal, cranial nerves are intact, there are no motor or sensory deficits.  ED Course  Procedures (including critical care time) Labs Review Results for orders placed during the hospital encounter of 05/15/14  CBC      Result Value Ref Range   WBC 11.9 (*) 4.0 - 10.5 K/uL   RBC 3.90  3.87 - 5.11 MIL/uL   Hemoglobin 11.4 (*) 12.0 - 15.0 g/dL   HCT 35.5 (*) 36.0 - 46.0 %   MCV 91.0  78.0 - 100.0 fL   MCH 29.2  26.0 - 34.0 pg   MCHC 32.1  30.0 - 36.0 g/dL   RDW 16.0 (*) 11.5 - 15.5 %   Platelets 175  150 - 400 K/uL  COMPREHENSIVE METABOLIC PANEL      Result Value Ref Range   Sodium 135 (*) 137 - 147 mEq/L   Potassium 4.1  3.7 - 5.3 mEq/L   Chloride 94 (*) 96 - 112 mEq/L   CO2 28  19 - 32 mEq/L   Glucose, Bld 134 (*) 70 - 99 mg/dL   BUN 10  6 - 23 mg/dL   Creatinine, Ser 0.63  0.50 - 1.10 mg/dL   Calcium 9.6  8.4 - 10.5 mg/dL   Total Protein 7.4  6.0 - 8.3 g/dL   Albumin 4.0  3.5 - 5.2 g/dL   AST 13  0 - 37 U/L   ALT 8  0 - 35 U/L   Alkaline Phosphatase 63  39 - 117 U/L   Total Bilirubin 0.7  0.3 - 1.2 mg/dL   GFR calc non Af Amer >90  >90 mL/min   GFR calc Af Amer >90  >90 mL/min   Anion gap 13  5 - 15  PRO B NATRIURETIC PEPTIDE      Result Value Ref Range   Pro B Natriuretic peptide (BNP) 4899.0 (*) 0 - 125  pg/mL  D-DIMER, QUANTITATIVE      Result Value Ref Range   D-Dimer, Quant 0.48  0.00 - 0.48 ug/mL-FEU  TROPONIN I      Result Value Ref Range   Troponin I <0.30  <0.30 ng/mL   Imaging Review Dg Chest 2 View  05/15/2014   CLINICAL DATA:  Chest pain tonight. History of diabetes, hypertension, COPD and CABG.  EXAM: CHEST  2 VIEW  COMPARISON:  01/31/2014 and 01/12/2014 radiographs.  FINDINGS: The heart size and mediastinal contours are stable status post CABG. Left atrial appendage closure device is noted. The vascularity is stable without edema. There is no confluent airspace opacity or pleural effusion. The osseous structures appear unchanged.  IMPRESSION: Stable postoperative chest. No acute cardiopulmonary process identified.   Electronically Signed   By: Camie Patience M.D.   On: 05/15/2014 02:59   Images viewed by me.   EKG Interpretation   Date/Time:  Tuesday May 15 2014 01:41:05 EDT Ventricular Rate:  68 PR Interval:  167 QRS Duration: 120 QT Interval:  417 QTC Calculation: 443 R Axis:   60 Text Interpretation:  Sinus rhythm Probable left atrial enlargement IVCD,  consider atypical RBBB ST depr, consider ischemia, inferior leads  Borderline ST elevation, lateral leads When compared with ECG of  01/10/2014, T wave inversion in the Anterior leads is less evident  Confirmed by [facility]  MD, Nakita Santerre ([id number]) on 05/15/2014 2:19:44 AM      MDM   Final diagnoses:  CHF exacerbation    Dyspnea of uncertain cause. This could be exacerbation of her underlying COPD, congestive heart failure, angina equivalent. ECG does not show any acute changes and troponin will be checked. She'll also be screened for pulmonary embolism with d-dimer. Chest x-ray does not show evidence of pneumonia or significant pulmonary vascular congestion, but BNP will be checked to evaluate for possible CHF. Old records are reviewed and she did have a coronary artery bypass earlier this year. She had apparently been doing  fairly well postop.  BNP has come back significantly elevated although not as high as it was in the immediate postoperative period. She's given a dose of furosemide in the ED. Case is discussed with Dr. [place] Pink of hospitalists who agrees to admit the patient.  Delora Fuel, MD [id number]

## 2014-05-15 NOTE — ED Notes (Signed)
Pt arrives via ems. Pt reports several days of "chest tightness" and feeling like she cannot catch her breath. Pt also reports she has been xanax for 3 days.

## 2014-05-15 NOTE — Progress Notes (Signed)
Patient seen/examined and chart reviewed.   She reports feeling better than she did when she came in. Complains of being "sore all over".   VSS, afebrile non-toxic appearing. Oxygen saturation level 96% on 2L. She does not wear oxygen at home.    Gen: well nourished somewhat chronically ill appearing NAD Resp: mild increased work of breathing with conversation, very diminished breath sounds throughout. No wheeze Cardio: RRR no MGR No LE edema MS: somewhat frail appearing. No clubbing no cyanosis  Plan #1 acute diastolic congestive heart failure: Continue IV Lasix. Continue with the ACE inhibitor. Continue with beta blocker. Echocardiogram was done recently in June, which showed normal systolic function with grade 1 diastolic dysfunction. Await cardiology recommendation. Strict ins and outs and daily weights.   #2 history of CAD, status post CABG recently: remains stable. No chest pain.  Continue with antiplatelet agents and beta blocker as well as lipid therapy. Serial troponins   #3 history of diabetes mellitus, type II: Sliding scale insulin coverage will be initiated. Await HbA1c.   #4 history of COPD: Continue with the nebulizer treatments as needed. Inhalers on a scheduled basis.   #5 history of atrial fibrillation: She hasn't had any further episodes since her cardiac surgery. She's not on any anticoagulation at this time. And is currently in sinus rhythm.   Dyanne Carrel NP

## 2014-05-15 NOTE — Progress Notes (Signed)
I have seen and examined this patient and I agree with the above assessment and plan.  - D dimer negative as well, unlikely PE.  Marzetta Board, MD [facility] [phone]

## 2014-05-15 NOTE — Consult Note (Signed)
Primary Physician: Primary Cardiologist:  Corky Downs     HPI: Patient is a 63 yo who has history of afib, HTM, tob use, COPD, diastolic CHF.  She also has 3 V CAD and underwent CABG in 01/09/14 (LIMA to LAD; SVG to ramus, SVG to LCx;  L atrial clip applied.  Post op LVEF 30% that normalized by last echo.   She was just seen by Corky Downs in clinic 04/25/14  He increased lopressor to 25 bid.   The patinet presented to [facility] today with SOB x 3 days.  Says she had been SOB since surgery but this was worse.  Some chest pressure.  Diaphraetic, nauseaated.  Some dizziness.  Some LE swelling.   BNP was 4899    patinet denies change in diet.   Weighs every am  Wt up 3 lb yesterdy  No LE edema or bloating.   No cough       Past Medical History  Diagnosis Date  . Diabetes mellitus   . Arthritis   . Anxiety   . Chronic pain     Bacl pain, Disc L5-S1- Dr. Joya Salm in [place]  . Hyperlipidemia   . Hypertension   . Tachycardia   . Bronchial asthma   . Atrial fibrillation   . COPD (chronic obstructive pulmonary disease)   . DDD (degenerative disc disease)     Radicular symptoms  . Shortness of breath   . CHF (congestive heart failure)   . Hx of echocardiogram     Was interpreted by Dr Doylene Canard that showed an Ef in the 50-60% range with grade 1 diastolic dysfunction. she had moderate MR, biatrial enlargement, moderate TR and at that time estimated RV systolic pressure was 43 mm.  . History of stress test 06/2011    Abnormal myocardial perfusion study.     (Not in a hospital admission)   . enoxaparin (LOVENOX) injection  40 mg Subcutaneous Q24H  . insulin aspart  0-15 Units Subcutaneous TID WC    Infusions:    Allergies  Allergen Reactions  . Adhesive [Tape] Other (See Comments)    Tears skin off.   . Latex Other (See Comments)    In tape, tears skin off.  . Zocor [Simvastatin - High Dose] Nausea And Vomiting    History   Social History  . Marital Status: Divorced    Spouse Name:  N/A    Number of Children: N/A  . Years of Education: N/A   Occupational History  . Not on file.   Social History Main Topics  . Smoking status: Former Smoker    Types: Cigarettes  . Smokeless tobacco: Never Used     Comment: quit 6 months ago  . Alcohol Use: No  . Drug Use: No  . Sexual Activity: Yes    Birth Control/ Protection: Surgical   Other Topics Concern  . Not on file   Social History Narrative  . No narrative on file    Family History  Problem Relation Age of Onset  . Diabetes Mother   . Heart disease Mother   . Diabetes Sister   . Hypertension Sister   . Hyperlipidemia Sister   . Diabetes Brother   . Heart disease Brother   . Diabetes Brother   . Heart disease Brother     REVIEW OF SYSTEMS:  All systems reviewed  Negative to the above problem except as noted above.    PHYSICAL EXAM: Filed Vitals:   05/15/14 0830  BP: 141/79  Pulse: 73  Temp:   Resp:      Intake/Output Summary (Last 24 hours) at 05/15/14 1049 Last data filed at 05/15/14 [id number]  Gross per 24 hour  Intake      0 ml  Output   1000 ml  Net  -1000 ml    General:  Patient is a 63 yo who does not appear comfortable  HEENT: normal Neck: supple. JVP is increased.  . Carotids 2+ bilat; no bruits. No lymphadenopathy or thryomegaly appreciated. Cor: PMI nondisplaced. Regular rate & rhythm. No rubs, gallops or murmurs. Lungs: Mild crackles on L   Abdomen: soft, nontender, nondistended. No hepatosplenomegaly. No bruits or masses. Good bowel sounds. Extremities: no cyanosis, clubbing, rash, edema Neuro: alert & oriented x 3, cranial nerves grossly intact. moves all 4 extremities w/o difficulty. Affect pleasant.  ECG:  SR  RBBB  Nonspeciific ST T wave changes    Results for orders placed during the hospital encounter of 05/15/14 (from the past 24 hour(s))  CBC     Status: Abnormal   Collection Time    05/15/14  2:29 AM      Result Value Ref Range   WBC 11.9 (*) 4.0 - 10.5 K/uL   RBC  3.90  3.87 - 5.11 MIL/uL   Hemoglobin 11.4 (*) 12.0 - 15.0 g/dL   HCT 35.5 (*) 36.0 - 46.0 %   MCV 91.0  78.0 - 100.0 fL   MCH 29.2  26.0 - 34.0 pg   MCHC 32.1  30.0 - 36.0 g/dL   RDW 16.0 (*) 11.5 - 15.5 %   Platelets 175  150 - 400 K/uL  COMPREHENSIVE METABOLIC PANEL     Status: Abnormal   Collection Time    05/15/14  2:29 AM      Result Value Ref Range   Sodium 135 (*) 137 - 147 mEq/L   Potassium 4.1  3.7 - 5.3 mEq/L   Chloride 94 (*) 96 - 112 mEq/L   CO2 28  19 - 32 mEq/L   Glucose, Bld 134 (*) 70 - 99 mg/dL   BUN 10  6 - 23 mg/dL   Creatinine, Ser 0.63  0.50 - 1.10 mg/dL   Calcium 9.6  8.4 - 10.5 mg/dL   Total Protein 7.4  6.0 - 8.3 g/dL   Albumin 4.0  3.5 - 5.2 g/dL   AST 13  0 - 37 U/L   ALT 8  0 - 35 U/L   Alkaline Phosphatase 63  39 - 117 U/L   Total Bilirubin 0.7  0.3 - 1.2 mg/dL   GFR calc non Af Amer >90  >90 mL/min   GFR calc Af Amer >90  >90 mL/min   Anion gap 13  5 - 15  PRO B NATRIURETIC PEPTIDE     Status: Abnormal   Collection Time    05/15/14  3:05 AM      Result Value Ref Range   Pro B Natriuretic peptide (BNP) 4899.0 (*) 0 - 125 pg/mL  D-DIMER, QUANTITATIVE     Status: None   Collection Time    05/15/14  3:05 AM      Result Value Ref Range   D-Dimer, Quant 0.48  0.00 - 0.48 ug/mL-FEU  TROPONIN I     Status: None   Collection Time    05/15/14  3:44 AM      Result Value Ref Range   Troponin I <0.30  <0.30 ng/mL  CBG MONITORING, ED  Status: Abnormal   Collection Time    05/15/14  8:04 AM      Result Value Ref Range   Glucose-Capillary 148 (*) 70 - 99 mg/dL   Dg Chest 2 View  05/15/2014   CLINICAL DATA:  Chest pain tonight. History of diabetes, hypertension, COPD and CABG.  EXAM: CHEST  2 VIEW  COMPARISON:  01/31/2014 and 01/12/2014 radiographs.  FINDINGS: The heart size and mediastinal contours are stable status post CABG. Left atrial appendage closure device is noted. The vascularity is stable without edema. There is no confluent airspace  opacity or pleural effusion. The osseous structures appear unchanged.  IMPRESSION: Stable postoperative chest. No acute cardiopulmonary process identified.   Electronically Signed   By: Camie Patience M.D.   On: 05/15/2014 02:59     ASSESSMENT: Patient is a 63 yo with history of CAD  She is s/p CABG in March  Was doing well on 7/1 when seen by Corky Downs  Now a 3 day history of feeling bad.  On exam, evid of some increased volume  She denies Change in diet.  Recomm:  Admit  R/O  MI  Will review anatomy   Diurese  Strict I/O.   Would get limited echo to reassess LVEF  Not sure what has happened to cause clinical change.

## 2014-05-15 NOTE — ED Notes (Signed)
Patient resting quietly at this time, with family at bedside

## 2014-05-15 NOTE — H&P (Addendum)
[facility] History and Physical  Mary [name] EBR:[id number] DOB: 09-16-1951 DOA: 05/15/2014   PCP: Vic Blackbird, MD  Specialists: Dr. Claiborne Billings is her cardiologist  Chief Complaint: Shortness of breath for 3 days  HPI: Mary [name] is a 63 y.o. female with a past medical history of coronary artery disease, status post CABG in March of this year, diabetes on oral agents, COPD, peripheral neuropathy, history of diastolic dysfunction, who also has chronic back pain, and who was in her usual state of health till about 3 days ago, when she started developing worsening shortness of breath. She tells me that ever since her cardiac surgery she has been short of breath everyday, but this has gotten worse in the last 3 days. She has also experienced chest pressure but is unable to quantify it. She's had symptoms suggestive of orthopnea. Denies any cough. Has had nausea, but no vomiting. Has been diaphoretic. Had some dizziness. Has some leg swelling and weakness. Denies any weight gain. Has felt extremely anxious. She was seen by a cardiologist earlier this month and her digoxin was discontinued.  Home Medications: Medication list needs to be reconciled by pharmacy. Prior to Admission medications   Medication Sig Start Date End Date Taking? Authorizing Provider  albuterol (PROAIR HFA) 108 (90 BASE) MCG/ACT inhaler Inhale 2 puffs into the lungs every 6 (six) hours as needed for wheezing or shortness of breath.     Historical Provider, MD  ALPRAZolam Duanne Moron) 1 MG tablet Take 1 tablet (1 mg total) by mouth 2 (two) times daily as needed for anxiety. 05/14/14   Alycia Rossetti, MD  aspirin EC 81 MG tablet Take 81 mg by mouth daily.    Historical Provider, MD  atorvastatin (LIPITOR) 20 MG tablet Take 1 tablet (20 mg total) by mouth daily. 11/27/13   Alycia Rossetti, MD  budesonide-formoterol [facility]) 160-4.5 MCG/ACT inhaler Inhale 2 puffs into the lungs 2 (two) times daily. 10/27/13   Nita Sells,  MD  Canagliflozin (INVOKANA) 100 MG TABS Take 1 tablet (100 mg total) by mouth daily. 04/10/14   Alycia Rossetti, MD  ferrous [id number] C-folic acid ([name] / FOLTRIN) capsule Take 1 capsule by mouth daily. 03/07/14   Alycia Rossetti, MD  FLUoxetine (PROZAC) 40 MG capsule Take 1 capsule (40 mg total) by mouth daily. 04/16/14   Alycia Rossetti, MD  furosemide (LASIX) 40 MG tablet Take 1 tablet by mouth as needed. 03/02/14   Historical Provider, MD  lisinopril (PRINIVIL,ZESTRIL) 2.5 MG tablet Take 1 tablet (2.5 mg total) by mouth daily. 03/20/14   Troy Sine, MD  metoprolol tartrate (LOPRESSOR) 25 MG tablet Take 25 mg by mouth 2 (two) times daily. 01/15/14   Coolidge Breeze, PA-C  ondansetron (ZOFRAN) 4 MG tablet take 1 tablet by mouth every 6 hours if needed for nausea 04/25/14   Alycia Rossetti, MD  oxyCODONE-acetaminophen (PERCOCET) 7.5-325 MG per tablet Take 1 tablet by mouth 2 (two) times daily as needed for pain. 04/24/14   Susy Frizzle, MD  sitaGLIPtin (JANUVIA) 100 MG tablet take 1 tablet by mouth once daily 04/02/14   Alycia Rossetti, MD  sodium chloride (OCEAN) 0.65 % nasal spray Place 1 spray into the nose as needed. Congestion    Historical Provider, MD  thiamine 100 MG tablet Take 1 tablet (100 mg total) by mouth daily. 10/27/13   Nita Sells, MD  traMADol (ULTRAM) 50 MG tablet Take 1 tablet (50 mg total) by mouth  every 6 (six) hours as needed. 02/26/14   Rexene Alberts, MD    Allergies:  Allergies  Allergen Reactions  . Adhesive [Tape] Other (See Comments)    Tears skin off.   . Latex Other (See Comments)    In tape, tears skin off.  . Zocor [Simvastatin - High Dose] Nausea And Vomiting    Past Medical History: Past Medical History  Diagnosis Date  . Diabetes mellitus   . Arthritis   . Anxiety   . Chronic pain     Bacl pain, Disc L5-S1- Dr. Joya Salm in [place]  . Hyperlipidemia   . Hypertension   . Tachycardia   . Bronchial asthma   . Atrial fibrillation    . COPD (chronic obstructive pulmonary disease)   . DDD (degenerative disc disease)     Radicular symptoms  . Shortness of breath   . CHF (congestive heart failure)   . Hx of echocardiogram     Was interpreted by Dr Doylene Canard that showed an Ef in the 50-60% range with grade 1 diastolic dysfunction. she had moderate MR, biatrial enlargement, moderate TR and at that time estimated RV systolic pressure was 43 mm.  . History of stress test 06/2011    Abnormal myocardial perfusion study.    Past Surgical History  Procedure Laterality Date  . Carpal tunnel release      x 2  . Breast cyst removed    . Abdominal hysterectomy      partial  . Tubal ligation    . Tonsillectomy    . Coronary artery bypass graft N/A 01/09/2014    Procedure: CORONARY ARTERY BYPASS GRAFTING (CABG) x 3 using endoscopically harvested right saphenous vein and left internal mammary artery and closure of left atrial appendage;  Surgeon: Ivin Poot, MD;  Location: [place];  Service: Open Heart Surgery;  Laterality: N/A;  patient has preop IA BP   . Intraoperative transesophageal echocardiogram N/A 01/09/2014    Procedure: INTRAOPERATIVE TRANSESOPHAGEAL ECHOCARDIOGRAM;  Surgeon: Ivin Poot, MD;  Location: [place];  Service: Open Heart Surgery;  Laterality: N/A;    Social History: She lives in [name] with her sister. Quit smoking one year ago. Has a 60-pack-year history of smoking. No alcohol use. Independent with her daily activities. Denies any illicit drug use.  Family History:  Family History  Problem Relation Age of Onset  . Diabetes Mother   . Heart disease Mother   . Diabetes Sister   . Hypertension Sister   . Hyperlipidemia Sister   . Diabetes Brother   . Heart disease Brother   . Diabetes Brother   . Heart disease Brother      Review of Systems - History obtained from the patient General ROS: positive for  - fatigue Psychological ROS: negative Ophthalmic ROS: negative ENT ROS: negative Allergy  and Immunology ROS: negative Hematological and Lymphatic ROS: negative Endocrine ROS: negative Respiratory ROS: as in hpi Cardiovascular ROS: as in hpi Gastrointestinal ROS: no abdominal pain, change in bowel habits, or black or bloody stools Genito-Urinary ROS: no dysuria, trouble voiding, or hematuria Musculoskeletal ROS: negative Neurological ROS: no TIA or stroke symptoms Dermatological ROS: negative  Physical Examination  Filed Vitals:   05/15/14 0200 05/15/14 0300 05/15/14 0400 05/15/14 0430  BP: 164/98 90/68 151/81 145/88  Pulse: 68 66 66 66  Temp:      TempSrc:      Resp: 24 22 18 18   Height:      Weight:  SpO2: 99% 97% 96% 95%    BP 145/88  Pulse 66  Temp(Src) 97.9 F (36.6 C) (Oral)  Resp 18  Ht 5' 7"  (1.702 m)  Wt 64.864 kg (143 lb)  BMI 22.39 kg/m2  SpO2 95%  General appearance: alert, cooperative, appears stated age and no distress Head: Normocephalic, without obvious abnormality, atraumatic Eyes: conjunctivae/corneas clear. PERRL, EOM's intact.  Throat: lips, mucosa, and tongue normal; teeth and gums normal Neck: no adenopathy, no carotid bruit, no JVD, supple, symmetrical, trachea midline and thyroid not enlarged, symmetric, no tenderness/mass/nodules Resp: Chest crackles about one third of the lung fields bilaterally. No wheezing. Cardio: regular rate and rhythm, S1, S2 normal, no murmur, click, rub or gallop GI: soft, non-tender; bowel sounds normal; no masses,  no organomegaly Extremities: extremities normal, atraumatic, no cyanosis or edema Pulses: 2+ and symmetric Skin: Skin color, texture, turgor normal. No rashes or lesions Lymph nodes: Cervical, supraclavicular, and axillary nodes normal. Neurologic: No focal deficits  Laboratory Data: Results for orders placed during the hospital encounter of 05/15/14 (from the past 48 hour(s))  CBC     Status: Abnormal   Collection Time    05/15/14  2:29 AM      Result Value Ref Range   WBC 11.9 (*)  4.0 - 10.5 K/uL   RBC 3.90  3.87 - 5.11 MIL/uL   Hemoglobin 11.4 (*) 12.0 - 15.0 g/dL   HCT 35.5 (*) 36.0 - 46.0 %   MCV 91.0  78.0 - 100.0 fL   MCH 29.2  26.0 - 34.0 pg   MCHC 32.1  30.0 - 36.0 g/dL   RDW 16.0 (*) 11.5 - 15.5 %   Platelets 175  150 - 400 K/uL  COMPREHENSIVE METABOLIC PANEL     Status: Abnormal   Collection Time    05/15/14  2:29 AM      Result Value Ref Range   Sodium 135 (*) 137 - 147 mEq/L   Potassium 4.1  3.7 - 5.3 mEq/L   Chloride 94 (*) 96 - 112 mEq/L   CO2 28  19 - 32 mEq/L   Glucose, Bld 134 (*) 70 - 99 mg/dL   BUN 10  6 - 23 mg/dL   Creatinine, Ser 0.63  0.50 - 1.10 mg/dL   Calcium 9.6  8.4 - 10.5 mg/dL   Total Protein 7.4  6.0 - 8.3 g/dL   Albumin 4.0  3.5 - 5.2 g/dL   AST 13  0 - 37 U/L   ALT 8  0 - 35 U/L   Alkaline Phosphatase 63  39 - 117 U/L   Total Bilirubin 0.7  0.3 - 1.2 mg/dL   GFR calc non Af Amer >90  >90 mL/min   GFR calc Af Amer >90  >90 mL/min   Comment: (NOTE)     The eGFR has been calculated using the CKD EPI equation.     This calculation has not been validated in all clinical situations.     eGFR's persistently <90 mL/min signify possible Chronic Kidney     Disease.   Anion gap 13  5 - 15  PRO B NATRIURETIC PEPTIDE     Status: Abnormal   Collection Time    05/15/14  3:05 AM      Result Value Ref Range   Pro B Natriuretic peptide (BNP) 4899.0 (*) 0 - 125 pg/mL  D-DIMER, QUANTITATIVE     Status: None   Collection Time    05/15/14  3:05 AM  Result Value Ref Range   D-Dimer, Quant 0.48  0.00 - 0.48 ug/mL-FEU   Comment:            AT THE INHOUSE ESTABLISHED CUTOFF     VALUE OF 0.48 ug/mL FEU,     THIS ASSAY HAS BEEN DOCUMENTED     IN THE LITERATURE TO HAVE     A SENSITIVITY AND NEGATIVE     PREDICTIVE VALUE OF AT LEAST     98 TO 99%.  THE TEST RESULT     SHOULD BE CORRELATED WITH     AN ASSESSMENT OF THE CLINICAL     PROBABILITY OF DVT / VTE.  TROPONIN I     Status: None   Collection Time    05/15/14  3:44 AM       Result Value Ref Range   Troponin I <0.30  <0.30 ng/mL   Comment:            Due to the release kinetics of cTnI,     a negative result within the first hours     of the onset of symptoms does not rule out     myocardial infarction with certainty.     If myocardial infarction is still suspected,     repeat the test at appropriate intervals.    Radiology Reports: Dg Chest 2 View  05/15/2014   CLINICAL DATA:  Chest pain tonight. History of diabetes, hypertension, COPD and CABG.  EXAM: CHEST  2 VIEW  COMPARISON:  01/31/2014 and 01/12/2014 radiographs.  FINDINGS: The heart size and mediastinal contours are stable status post CABG. Left atrial appendage closure device is noted. The vascularity is stable without edema. There is no confluent airspace opacity or pleural effusion. The osseous structures appear unchanged.  IMPRESSION: Stable postoperative chest. No acute cardiopulmonary process identified.   Electronically Signed   By: Camie Patience M.D.   On: 05/15/2014 02:59    Electrocardiogram: Poor quality EKG shows sinus rhythm at 60 beats a minute. Normal axis. Possible intraventricular conduction delay. Possible right bundle-branch block. T inversions in V1 to V3. These changes appear to be old based on EKG from earlier this month  Problem List  Principal Problem:   Acute diastolic CHF (congestive heart failure) Active Problems:   Diabetes mellitus   COPD (chronic obstructive pulmonary disease)   Anxiety   S/P CABG x 3   CAD (coronary artery disease)   Assessment: This is a 63 year old, Caucasian female, with a past medical history as stated earlier, who presents with worsening shortness of breath for 3 days. Chest x-ray did not report any acute findings, but clinically she appears to have acute diastolic CHF. Chest pressure most likely is due to the CHF.  Plan: #1 acute diastolic congestive heart failure: She will be placed on IV Lasix. Continue with the ACE inhibitor. Continue with  beta blocker. Echocardiogram was done recently in June, which showed normal systolic function with grade 1 diastolic dysfunction. At this time we will consult cardiology. No need to repeat another echocardiogram. Strict ins and outs and daily weights.  #2 history of CAD, status post CABG recently: Appears to be stable. Continue with antiplatelet agents and beta blocker as well as lipid therapy. Serial troponins  #3 history of diabetes mellitus, type II: Sliding scale insulin coverage will be initiated. Check HbA1c.  #4 history of COPD: Continue with the nebulizer treatments as needed. Inhalers on a scheduled basis.  #5 history of atrial fibrillation: She hasn't had  any further episodes since her cardiac surgery. She's not on any anticoagulation at this time. And is currently in sinus rhythm.   DVT Prophylaxis: Lovenox Code Status: Full code Family Communication: No family at bedside. Discussed with the patient  Disposition Plan: Admit to telemetry   Further management decisions will depend on results of further testing and patient's response to treatment.   [facility]  [facility] Pager [phone]  If 7PM-7AM, please contact night-coverage [email or web address] Password [facility]  05/15/2014, 6:14 AM  Disclaimer: This note was dictated with voice recognition software. Similar sounding words can inadvertently be transcribed and may not be corrected upon review.

## 2014-05-15 NOTE — ED Notes (Signed)
Report given to Marian RN.

## 2014-05-16 ENCOUNTER — Other Ambulatory Visit: Payer: Self-pay | Admitting: Physician Assistant

## 2014-05-16 DIAGNOSIS — J441 Chronic obstructive pulmonary disease with (acute) exacerbation: Secondary | ICD-10-CM

## 2014-05-16 DIAGNOSIS — E119 Type 2 diabetes mellitus without complications: Secondary | ICD-10-CM

## 2014-05-16 DIAGNOSIS — I471 Supraventricular tachycardia, unspecified: Secondary | ICD-10-CM | POA: Diagnosis not present

## 2014-05-16 DIAGNOSIS — R0789 Other chest pain: Secondary | ICD-10-CM

## 2014-05-16 DIAGNOSIS — R079 Chest pain, unspecified: Secondary | ICD-10-CM | POA: Diagnosis present

## 2014-05-16 DIAGNOSIS — I10 Essential (primary) hypertension: Secondary | ICD-10-CM | POA: Diagnosis present

## 2014-05-16 LAB — GLUCOSE, CAPILLARY
Glucose-Capillary: 144 mg/dL — ABNORMAL HIGH (ref 70–99)
Glucose-Capillary: 206 mg/dL — ABNORMAL HIGH (ref 70–99)
Glucose-Capillary: 193 mg/dL — ABNORMAL HIGH (ref 70–99)
Glucose-Capillary: 120 mg/dL — ABNORMAL HIGH (ref 70–99)

## 2014-05-16 LAB — BASIC METABOLIC PANEL
ANION GAP: 10 (ref 5–15)
BUN: 14 mg/dL (ref 6–23)
CHLORIDE: 94 meq/L — AB (ref 96–112)
CO2: 34 meq/L — AB (ref 19–32)
CREATININE: 0.68 mg/dL (ref 0.50–1.10)
Calcium: 9.5 mg/dL (ref 8.4–10.5)
GFR calc non Af Amer: >90 mL/min (ref >90)
Glucose, Bld: 133 mg/dL — ABNORMAL HIGH (ref 70–99)
Potassium: 3.7 mEq/L (ref 3.7–5.3)
Sodium: 138 mEq/L (ref 137–147)

## 2014-05-16 LAB — TROPONIN I

## 2014-05-16 LAB — MRSA PCR SCREENING: MRSA BY PCR: NEGATIVE

## 2014-05-16 MED ORDER — REGADENOSON 0.4 MG/5ML IV SOLN: 0.4000 mg | Freq: Once | INTRAVENOUS | Status: DC

## 2014-05-16 MED ORDER — POTASSIUM CHLORIDE CRYS ER 20 MEQ PO TBCR
20.0000 meq | EXTENDED_RELEASE_TABLET | Freq: Two times a day (BID) | ORAL | Status: DC
  Administered 2014-05-16 (×2): 20 meq via ORAL
  Filled 2014-05-16 (×3): qty 1

## 2014-05-16 MED ORDER — METOPROLOL TARTRATE 50 MG PO TABS
50.0000 mg | ORAL_TABLET | Freq: Two times a day (BID) | ORAL | Status: DC
  Administered 2014-05-16 – 2014-05-17 (×2): 50 mg via ORAL
  Filled 2014-05-16 (×2): qty 1

## 2014-05-16 MED ORDER — FUROSEMIDE 40 MG PO TABS: 60.0000 mg | ORAL_TABLET | Freq: Every day | ORAL | Status: DC

## 2014-05-16 NOTE — Progress Notes (Signed)
Subjective:  Breathing better. Complains of soreness in her chest and some heaviness. Different from her prior angina.  Objective:  Vital Signs in the last 24 hours: Temp:  [98 F (36.7 C)-98.7 F (37.1 C)] 98.7 F (37.1 C) (07/22 0530) Pulse Rate:  [63-73] 73 (07/22 0920) Resp:  [20] 20 (07/22 0530) BP: (90-143)/(39-75) 122/64 mmHg (07/22 0920) SpO2:  [92 %-100 %] 97 % (07/22 0731) Weight:  [140 lb (63.504 kg)] 140 lb (63.504 kg) (07/22 0500)  Intake/Output from previous day: 07/21 0701 - 07/22 0700 In: -  Out: 2100 [Urine:2100] Intake/Output from this shift: Total I/O In: 123 [P.O.:120; I.V.:3] Out: 1000 [Urine:1000]  Physical Exam: NECK: Without JVD, HJR, or bruit LUNGS: Decreased breath sounds with few crackles at bases HEART: Regular rate and rhythm, no murmur, gallop, rub, bruit, thrill, or heave EXTREMITIES: Without cyanosis, clubbing, or edema   Lab Results:  Recent Labs  05/15/14 0229  WBC 11.9*  HGB 11.4*  PLT 175    Recent Labs  05/15/14 0229 05/16/14 0445  NA 135* 138  K 4.1 3.7  CL 94* 94*  CO2 28 34*  GLUCOSE 134* 133*  BUN 10 14  CREATININE 0.63 0.68    Recent Labs  05/15/14 2203 05/16/14 0445  TROPONINI <0.30 <0.30   Hepatic Function Panel  Recent Labs  05/15/14 0229  PROT 7.4  ALBUMIN 4.0  AST 13  ALT 8  ALKPHOS 63  BILITOT 0.7   No results found for this basename: CHOL,  in the last 72 hours No results found for this basename: PROTIME,  in the last 72 hours  Imaging:7/21 Study Conclusions  - Left ventricle: The cavity size was normal. Wall thickness was   normal. Systolic function was normal. The estimated ejection   fraction was in the range of 55% to 60%. - Aortic valve: There was trivial regurgitation. - Left atrium: The atrium was mildly dilated. - Right atrium: The atrium was mildly dilated. - Pulmonary arteries: PA peak pressure: 38 mm Hg (S).    Cardiac Studies:  Assessment/Plan:  CHF: normal LV  function on 2Decho EF 55-60%, diuresed 2100 overnight and 877 so far today after Lasix 60mg  IV BID. Will decreased to 60 mg daily and change to oral in am. Patient only takes it prn at home and hasn't needed it. No change in diet. Add potassium  CABG 12/2013 post of EF 30% with normalization. Lopressor increased by Dr. Claiborne Billings 04/25/14 and Lanoxin stopped since she was maintaining NSR. Some chest soreness and heaviness. Consider evaluation with stress myoview. Discussed with Dr. Harl Bowie who will decide.   Hx PAF: maintaining NSR  LOS: 1 day    Mary [name] 05/16/2014, 10:09 AM   Patient seen and discussed with PA Bonnell Public, I agree with her documentation above. 63 yo female hx of afib, COPD, chronic diastolic HF, CAD with prior CABG 01/09/2014 with previously LV systolic dysfunction prior to CABG that normalized after revascularization presents with chest pain and SOB. Noted 3 lbs weight gain at home, per notes evidence of extra volume on exam at admission. Repeat echo shows stable LVEF at 0000000, diastolic function not described but E/e' 15 consistent with elevated LA pressure. Patient with acute on chronic diastolic heart failure. She is negative 3 liters since admission. Cr remains stable. She is on lasix 60mg  IV bid. No evidence of ACS by enzymes or EKG. CXR no acute process. Tele shows episodes of PSVT, narrow complex and regular to the 140s. Will increase metoprolol,  this could be one of the causes of her diastolic HF exacerbation and potentially her episodic chest pain. She had recent CABG, ischemia is less likely cause of chest pain and heart failure exacerbation but is possible, there is an estimated 25% graft occlusion rate seen within the first 12 months after surgery. Given episodic chest pain and progression of diastolic heart failure will obtain Lexiscan MPI tomorrow.   Mary Abts MD

## 2014-05-16 NOTE — Progress Notes (Signed)
UR chart review completed.  

## 2014-05-16 NOTE — Progress Notes (Signed)
The patient was seen and examined. Her chart, vital signs, laboratory studies were reviewed. She was discussed with nurse practitioner, Ms. Renard Hamper. Agree with her assessment and plan, with additions below.  The patient's 2-D echocardiogram reveals: Study Conclusions - Left ventricle: The cavity size was normal. Wall thickness was normal. Systolic function was normal. The estimated ejection fraction was in the range of 55% to 60%. - Aortic valve: There was trivial regurgitation. - Left atrium: The atrium was mildly dilated. - Right atrium: The atrium was mildly dilated. - Pulmonary arteries: PA peak pressure: 38 mm Hg (S).  Her TSH is within normal limits. Her hemoglobin A1c is 7.2. We'll continue the plan as outlined by Ms. Black. Per cardiology, there would be further evaluation with a stress test tomorrow.

## 2014-05-16 NOTE — Progress Notes (Signed)
[facility] PROGRESS NOTE  Mary [name] [id number] DOB: July 15, 1951 DOA: 05/15/2014 PCP: Vic Blackbird, MD  Assessment/Plan: #1 acute on chronic diastolic congestive heart failure: trigger unclear but likely progression of disease vs ischemia. Patient only takes lasix prn at home.  Volume status -2.9L. Weight 63.5kg trending down from 64.8kg. IV Lasix transitioned po tomorrow. Continue with the ACE inhibitor. Continue with beta blocker. Echocardiogram was done recently in June, which showed normal systolic function with grade 1 diastolic dysfunction. Repeat echo with EF 55-60%. Evaluated by cardiology who recommend continued IV diuresis today.  Strict ins and outs and daily weights.   #2 history of CAD, status post CABG recently: evaluated by cardiology who report no evidence of ACS by enzymes or EKG. Chest xray no acute process. Continue with chest "soreness" that is reproducable. Continue with antiplatelet agents and beta blocker as well as lipid therapy. Cardiology recommending Lexiscan MPI tomorrow. NPO past midnight  #3 history of diabetes mellitus, type II: CBG range 144-206. Continue sliding scale insulin. HbA1c 7.2 carb modified diet  #4 history of COPD: stable at baseline.  Continue with the nebulizer treatments as needed. Inhalers on a scheduled basis.   #5 history of atrial fibrillation: She hasn't had any further episodes since her cardiac surgery. She's not on any anticoagulation at this time. And is currently in sinus rhythm.    Code Status: full Family Communication: none present Disposition Plan: home when ready   Consultants:  Dr Harl Bowie cardiology  Procedures:  echo 05/15/14 Left ventricle: The cavity size was normal. Wall thickness was normal. Systolic function was normal. The estimated ejection fraction was in the range of 55% to 60%. - Aortic valve: There was trivial regurgitation. - Left atrium: The atrium was mildly dilated. - Right atrium: The atrium  was mildly dilated.  Antibiotics:  none  HPI/Subjective: Awake alert. Reports continued "soreness" and generalized weakness today  Objective: Filed Vitals:   05/16/14 1453  BP: 99/53  Pulse: 58  Temp: 97.5 F (36.4 C)  Resp: 20    Intake/Output Summary (Last 24 hours) at 05/16/14 1550 Last data filed at 05/16/14 0947  Gross per 24 hour  Intake    123 ml  Output   2100 ml  Net  -1977 ml   Filed Weights   05/15/14 0144 05/16/14 0500  Weight: 64.864 kg (143 lb) 63.504 kg (140 lb)    Exam:   General:  Well nourished NAD  Cardiovascular: S1 and S2. No MGR no LE edema. Anterior chest tender to palpation  Respiratory: normal effort BS diminished but i hear few crackles bilateral base. i hear no wheeze  Abdomen: round non-distended with +BS throughout non-tender to palpation  Musculoskeletal: no clubbing or cyanosis  Data Reviewed: Basic Metabolic Panel:  Recent Labs Lab 05/15/14 0229 05/16/14 0445  NA 135* 138  K 4.1 3.7  CL 94* 94*  CO2 28 34*  GLUCOSE 134* 133*  BUN 10 14  CREATININE 0.63 0.68  CALCIUM 9.6 9.5   Liver Function Tests:  Recent Labs Lab 05/15/14 0229  AST 13  ALT 8  ALKPHOS 63  BILITOT 0.7  PROT 7.4  ALBUMIN 4.0   No results found for this basename: LIPASE, AMYLASE,  in the last 168 hours No results found for this basename: AMMONIA,  in the last 168 hours CBC:  Recent Labs Lab 05/15/14 0229  WBC 11.9*  HGB 11.4*  HCT 35.5*  MCV 91.0  PLT 175   Cardiac Enzymes:  Recent Labs  Lab 05/15/14 0344 05/15/14 1654 05/15/14 2203 05/16/14 0445  TROPONINI <0.30 <0.30 <0.30 <0.30   BNP (last 3 results)  Recent Labs  10/23/13 1214 01/06/14 1025 05/15/14 0305  PROBNP 7918.0* 8105.0* 4899.0*   CBG:  Recent Labs Lab 05/15/14 1209 05/15/14 1615 05/15/14 2047 05/16/14 0733 05/16/14 1126  GLUCAP 125* 175* 124* 144* 206*    Recent Results (from the past 240 hour(s))  MRSA PCR SCREENING     Status: None    Collection Time    05/15/14  9:00 PM      Result Value Ref Range Status   MRSA by PCR NEGATIVE  NEGATIVE Final   Comment:            The GeneXpert MRSA Assay (FDA     approved for NASAL specimens     only), is one component of a     comprehensive MRSA colonization     surveillance program. It is not     intended to diagnose MRSA     infection nor to guide or     monitor treatment for     MRSA infections.     Studies: Dg Chest 2 View  05/15/2014   CLINICAL DATA:  Chest pain tonight. History of diabetes, hypertension, COPD and CABG.  EXAM: CHEST  2 VIEW  COMPARISON:  01/31/2014 and 01/12/2014 radiographs.  FINDINGS: The heart size and mediastinal contours are stable status post CABG. Left atrial appendage closure device is noted. The vascularity is stable without edema. There is no confluent airspace opacity or pleural effusion. The osseous structures appear unchanged.  IMPRESSION: Stable postoperative chest. No acute cardiopulmonary process identified.   Electronically Signed   By: Camie Patience M.D.   On: 05/15/2014 02:59    Scheduled Meds: . antiseptic oral rinse  15 mL Mouth Rinse BID  . aspirin EC  81 mg Oral Daily  . atorvastatin  20 mg Oral q1800  . budesonide-formoterol  2 puff Inhalation BID  . enoxaparin (LOVENOX) injection  40 mg Subcutaneous Q24H  . FLUoxetine  40 mg Oral Daily  . furosemide  60 mg Intravenous Q12H  . [START ON 05/17/2014] furosemide  60 mg Oral Daily  . insulin aspart  0-15 Units Subcutaneous TID WC  . insulin aspart  0-5 Units Subcutaneous QHS  . lisinopril  2.5 mg Oral Daily  . metoprolol tartrate  50 mg Oral BID  . potassium chloride  20 mEq Oral BID  . regadenoson  0.4 mg Intravenous Once  . sodium chloride  3 mL Intravenous Q12H  . thiamine  100 mg Oral Daily   Continuous Infusions:   Principal Problem:   Acute diastolic CHF (congestive heart failure) Active Problems:   Diabetes mellitus   COPD (chronic obstructive pulmonary disease)    Anxiety   S/P CABG x 3   CAD (coronary artery disease)   Chest pain    Time spent: [facility] Pager [phone]. If 7PM-7AM, please contact night-coverage at [email or web address], password [email or web address] 05/16/2014, 3:50 PM  LOS: 1 day

## 2014-05-17 ENCOUNTER — Encounter (HOSPITAL_COMMUNITY): Payer: Self-pay

## 2014-05-17 ENCOUNTER — Inpatient Hospital Stay (HOSPITAL_COMMUNITY): Payer: Medicare Other

## 2014-05-17 DIAGNOSIS — R079 Chest pain, unspecified: Secondary | ICD-10-CM

## 2014-05-17 LAB — CBC
HEMATOCRIT: 39.1 % (ref 36.0–46.0)
HEMOGLOBIN: 12.7 g/dL (ref 12.0–15.0)
MCH: 29.3 pg (ref 26.0–34.0)
MCHC: 32.5 g/dL (ref 30.0–36.0)
MCV: 90.3 fL (ref 78.0–100.0)
Platelets: 233 10*3/uL (ref 150–400)
RBC: 4.33 MIL/uL (ref 3.87–5.11)
RDW: 16.6 % — AB (ref 11.5–15.5)
WBC: 9 10*3/uL (ref 4.0–10.5)

## 2014-05-17 LAB — GLUCOSE, CAPILLARY
GLUCOSE-CAPILLARY: 229 mg/dL — AB (ref 70–99)
Glucose-Capillary: 173 mg/dL — ABNORMAL HIGH (ref 70–99)
Glucose-Capillary: 96 mg/dL (ref 70–99)

## 2014-05-17 LAB — BASIC METABOLIC PANEL
Anion gap: 11 (ref 5–15)
BUN: 17 mg/dL (ref 6–23)
CALCIUM: 9.4 mg/dL (ref 8.4–10.5)
CHLORIDE: 95 meq/L — AB (ref 96–112)
CO2: 32 meq/L (ref 19–32)
CREATININE: 0.65 mg/dL (ref 0.50–1.10)
GFR calc Af Amer: >90 mL/min (ref >90)
GFR calc non Af Amer: >90 mL/min (ref >90)
GLUCOSE: 157 mg/dL — AB (ref 70–99)
Potassium: 4.1 mEq/L (ref 3.7–5.3)
Sodium: 138 mEq/L (ref 137–147)

## 2014-05-17 MED ORDER — REGADENOSON 0.4 MG/5ML IV SOLN
INTRAVENOUS | Status: AC
  Administered 2014-05-17: 0.4 mg via INTRAVENOUS
  Filled 2014-05-17: qty 5

## 2014-05-17 MED ORDER — TECHNETIUM TC 99M SESTAMIBI GENERIC - CARDIOLITE
10.0000 | Freq: Once | INTRAVENOUS | Status: AC | PRN
  Administered 2014-05-17: 10 via INTRAVENOUS

## 2014-05-17 MED ORDER — TECHNETIUM TC 99M SESTAMIBI - CARDIOLITE
30.0000 | Freq: Once | INTRAVENOUS | Status: AC | PRN
  Administered 2014-05-17: 10:00:00 30 via INTRAVENOUS

## 2014-05-17 MED ORDER — FUROSEMIDE 40 MG PO TABS: 40.0000 mg | ORAL_TABLET | Freq: Every day | ORAL | Status: DC

## 2014-05-17 MED ORDER — POTASSIUM CHLORIDE CRYS ER 20 MEQ PO TBCR: 20.0000 meq | EXTENDED_RELEASE_TABLET | Freq: Every day | ORAL | Status: DC

## 2014-05-17 MED ORDER — METOPROLOL TARTRATE 25 MG PO TABS: 25.0000 mg | ORAL_TABLET | Freq: Three times a day (TID) | ORAL | Status: DC

## 2014-05-17 MED ORDER — SODIUM CHLORIDE 0.9 % IJ SOLN
INTRAMUSCULAR | Status: AC
  Administered 2014-05-17: 10:00:00 via INTRAVENOUS
  Filled 2014-05-17: qty 10

## 2014-05-17 NOTE — Discharge Summary (Signed)
Physician Discharge Summary  Mary [name] [id number] DOB: 07/20/1951 DOA: 05/15/2014  PCP: Vic Blackbird, MD  Admit date: 05/15/2014 Discharge date: 05/17/2014  Time spent: Greater than 30 minutes  Recommendations for Outpatient Follow-up:  1. Recommend rechecking the patient's electrolytes on schedule daily Lasix.  2. Recommend checking the patient's blood pressure and heart rate on higher dose of metoprolol.  Discharge Diagnoses:   1. Acute diastolic heart failure. ----Patient diuresed 4.5 L. ----Weight on admission 143 pounds. Weight at the time of discharge approximately 138 pounds. 2. Chest pain, myocardial infarction ruled out. Chest pain likely chest wall pain. 3. Coronary artery disease, status post three-vessel CABG on 01/09/14. 4. Brief episode of PSVT. 5. Hypertension with low-normal blood pressures during hospitalization. 6. Type 2 diabetes mellitus. Hemoglobin A1c 7.2. 7. COPD, stable, without exacerbation. 8. Chronic anxiety.  Discharge Condition: Improved.  Diet recommendation: Heart healthy/carbohydrate modified.  Filed Weights   05/15/14 0144 05/16/14 0500 05/17/14 0654  Weight: 64.864 kg (143 lb) 63.504 kg (140 lb) 62.506 kg (137 lb 12.8 oz)    History of present illness:  The patient is a 63 year old woman with a history of coronary artery disease, status post CABG in March of 2015, type 2 diabetes mellitus, and peripheral neuropathy, who presented to the emergency department on 05/15/14 with a chief complaint of shortness of breath for 3 days. She also had some left-sided chest pain. In the ED, she was afebrile and hemodynamically stable. Her lab data were significant for a normal troponin I., pro BNP of 4899, normal d-dimer of 0.48, and mildly elevated WBC of 11.9. Her EKG reveals sinus rhythm, right bundle branch block, and nonspecific ST and T wave abnormalities. Her chest x-ray revealed no acute cardiopulmonary abnormalities. She was admitted for further  evaluation and management.  Hospital Course:   Acute diastolic heart failure.  The patient was started on IV Lasix at 60 mg IV every 12 hours. Beta blockade therapy was continued with metoprolol and ACE inhibitor therapy was continued with lisinopril. Strict I.'s and os and daily weights were ordered. For further evaluation, a followup 2-D echocardiogram, cardiac enzymes, and TSH were ordered. Also, cardiology was consulted. Echocardiogram on 7/22 revealed an ejection fraction of 55-60%, but no mention of diastolic function. Diastolic dysfunction was presumed. There was also mild mild pulmonary hypertension on the echo. Her cardiac enzymes were negative. Her TSH was within normal limits at 0.69. She improved clinically and symptomatically.  She is diuresed a -4.2 L. Her weight decreased by approximately 5 pounds. She was discharged on 40 mg of Lasix daily rather than when necessary. Episode of PSVT on 7/22.  Cardiology noted that the patient had a small run of PSVT with a heart rate in the 140s. Per cardiology, this could have been one of the causes of her acute diastolic failure. Metoprolol was increased from 25 mg twice a day to 50 mg twice a day. However, the patient's systolic blood pressure fell to the 90s. After discussing it with Dr. Domenic Polite, cardiologist, it was decided that she would be discharged on 25 mg 3 times daily. Further management will be deferred to her primary cardiologist. Her TSH is within normal limits at 0.69.  Chest pain in a patient with CAD and recent CABG x3.  She was continued on beta blocker, aspirin, and statin. Oxygen was applied. Upon further questioning, it appeared that her pain was mostly chest wall pain. She was treated with analgesics as needed. Nevertheless, a stress test was ordered by  cardiology. The results are above, however, it did not reveal any concerning or worrisome findings.  Hypertension.  The patient's blood pressures were low-normal. She was continued  on metoprolol and lisinopril with parameters to hold them if her systolic blood pressure was less than 95.  Type 2 diabetes mellitus.  Her oral medications were withheld and she was treated with sliding scale NovoLog. Her blood glucose was relatively fairly controlled. She was instructed to resume her normal home regimen. Her hemoglobin A1c was 7.2.  COPD.  Remained stable on her bronchodilator.  Chronic anxiety.  Remained stable on when necessary Xanax and fluoxetine.     Procedures: 1. 05/17/14: Lexisscan Cardiolite: Overall low risk; no diagnostic ST segment abnormality; perfusion imaging shows evidence of probable soft tissue attenuation (less likely scar) affecting the apical lateral wall without large ischemic zones; left ventricular ejection fraction normal at 58% with normal volumes.  2. 2-D echocardiogram 05/16/14:Study Conclusions - Left ventricle: The cavity size was normal. Wall thickness was normal. Systolic function was normal. The estimated ejection fraction was in the range of 55% to 60%. - Aortic valve: There was trivial regurgitation. - Left atrium: The atrium was mildly dilated. - Right atrium: The atrium was mildly dilated. - Pulmonary arteries: PA peak pressure: 38 mm Hg (S).   Consultations:  Cardiology  Discharge Exam: Filed Vitals:   05/17/14 1458  BP: 121/65  Pulse: 66  Temp: 98.2 F (36.8 C)  Resp: 20   General: 63 year-old woman sitting up in the bed, in no acute distress. Cardiovascular: S1, S2, with a soft systolic murmur. Chest wall. Healing sternotomy scar. Mild tenderness over the left pectoralis major/chest wall. No surrounding erythema or rash. Respiratory: Rare crackles in the bases, breathing nonlabored. Abdomen: Positive bowel cells, soft, nontender, nondistended. Musculoskeletal/extremities: No acute hot red joints. No pedal edema.     Discharge Instructions You were cared for by a hospitalist during your hospital stay. If you have any  questions about your discharge medications or the care you received while you were in the hospital after you are discharged, you can call the unit and asked to speak with the hospitalist on call if the hospitalist that took care of you is not available. Once you are discharged, your primary care physician will handle any further medical issues. Please note that NO REFILLS for any discharge medications will be authorized once you are discharged, as it is imperative that you return to your primary care physician (or establish a relationship with a primary care physician if you do not have one) for your aftercare needs so that they can reassess your need for medications and monitor your lab values.  Discharge Instructions   Diet - low sodium heart healthy    Complete by:  As directed      Diet Carb Modified    Complete by:  As directed      Increase activity slowly    Complete by:  As directed             Medication List    STOP taking these medications       naproxen sodium 220 MG tablet  Commonly known as:  ANAPROX      TAKE these medications       ALPRAZolam 1 MG tablet  Commonly known as:  XANAX  Take 1 tablet (1 mg total) by mouth 2 (two) times daily as needed for anxiety.     aspirin EC 81 MG tablet  Take 81 mg  by mouth daily.     atorvastatin 20 MG tablet  Commonly known as:  LIPITOR  Take 1 tablet (20 mg total) by mouth daily.     budesonide-formoterol 160-4.5 MCG/ACT inhaler  Commonly known as:  SYMBICORT  Inhale 2 puffs into the lungs 2 (two) times daily.     Canagliflozin 100 MG Tabs  Commonly known as:  INVOKANA  Take 1 tablet (100 mg total) by mouth daily.     ferrous [id number] C-folic acid capsule  Commonly known as:  [name] / FOLTRIN  Take 1 capsule by mouth daily.     FLUoxetine 40 MG capsule  Commonly known as:  PROZAC  Take 1 capsule (40 mg total) by mouth daily.     furosemide 40 MG tablet  Commonly known as:  LASIX  Take 1 tablet (40  mg total) by mouth daily. Take daily.     lisinopril 2.5 MG tablet  Commonly known as:  PRINIVIL,ZESTRIL  Take 1 tablet (2.5 mg total) by mouth daily.     metoprolol tartrate 25 MG tablet  Commonly known as:  LOPRESSOR  Take 1 tablet (25 mg total) by mouth 3 (three) times daily. Now take 1 tablet 3 times daily.     ondansetron 4 MG tablet  Commonly known as:  ZOFRAN  take 1 tablet by mouth every 6 hours if needed for nausea     oxyCODONE-acetaminophen 7.5-325 MG per tablet  Commonly known as:  PERCOCET  Take 1 tablet by mouth 2 (two) times daily as needed for pain.     potassium chloride SA 20 MEQ tablet  Commonly known as:  K-DUR,KLOR-CON  Take 1 tablet (20 mEq total) by mouth daily. Take with furosemide (Lasix).  Start taking on:  05/18/2014     PROAIR HFA 108 (90 BASE) MCG/ACT inhaler  Generic drug:  albuterol  Inhale 2 puffs into the lungs every 6 (six) hours as needed for wheezing or shortness of breath.     sitaGLIPtin 100 MG tablet  Commonly known as:  JANUVIA  take 1 tablet by mouth once daily     sodium chloride 0.65 % nasal spray  Commonly known as:  OCEAN  Place 1 spray into the nose as needed for congestion. Congestion     thiamine 100 MG tablet  Take 1 tablet (100 mg total) by mouth daily.       Allergies  Allergen Reactions  . Adhesive [Tape] Other (See Comments)    Tears skin off.   . Latex Other (See Comments)    In tape, tears skin off.  . Zocor [Simvastatin - High Dose] Nausea And Vomiting       Follow-up Information   Follow up with Troy Sine, MD On 06/04/2014. (at 2:30 pm)    Specialty:  Cardiology   Contact information:   [address] [place] [address] [phone]        The results of significant diagnostics from this hospitalization (including imaging, microbiology, ancillary and laboratory) are listed below for reference.    Significant Diagnostic Studies: Dg Chest 2 View  05/15/2014   CLINICAL DATA:   Chest pain tonight. History of diabetes, hypertension, COPD and CABG.  EXAM: CHEST  2 VIEW  COMPARISON:  01/31/2014 and 01/12/2014 radiographs.  FINDINGS: The heart size and mediastinal contours are stable status post CABG. Left atrial appendage closure device is noted. The vascularity is stable without edema. There is no confluent airspace opacity or pleural effusion. The osseous structures  appear unchanged.  IMPRESSION: Stable postoperative chest. No acute cardiopulmonary process identified.   Electronically Signed   By: Camie Patience M.D.   On: 05/15/2014 02:59   Nm Myocar Single W/spect W/wall Motion And Ef  05/17/2014   CLINICAL DATA:  63 year old woman with history of CAD status post CABG in March of this year, diastolic heart failure with LVEF 55-60%, and prior atrial fibrillation with more recently documented PSVT. This study is requested to evaluate for the presence and extent of ischemia.  EXAM: MYOCARDIAL IMAGING WITH SPECT (REST AND PHARMACOLOGIC-STRESS)  GATED LEFT VENTRICULAR WALL MOTION STUDY  LEFT VENTRICULAR EJECTION FRACTION  TECHNIQUE: Standard myocardial SPECT imaging was performed after resting intravenous injection of 10 mCi Tc-66m sestamibi. Subsequently, intravenous infusion of Lexiscan was performed under the supervision of the Cardiology staff. At peak effect of the drug, 30 mCi Tc-35m sestamibi was injected intravenously and standard myocardial SPECT imaging was performed. Quantitative gated imaging was also performed to evaluate left ventricular wall motion, and estimate left ventricular ejection fraction.  FINDINGS: Baseline tracing shows sinus bradycardia at 59 beats per min with right bundle branch block and nonspecific ST segment changes. Lexiscan bolus was given in standard fashion. Heart rate increased from 58 beats per min up to 89 beats per min, and blood pressure increased from 101/67 up to 127/66. Patient experienced shortness of breath with infusion. There were no clearly  diagnostic ST segment abnormalities over baseline changes, and no arrhythmias were noted.  Analysis of the raw perfusion data shows adequate myocardial radiotracer uptake. There is significant gut and hepatic uptake as well.  Tomographic views were obtained using the short axis, vertical long axis, and horizontal long axis planes. There is a small, mild intensity, fixed apical inferolateral defect. No clearly reversible defects are noted to indicate ischemia.  Gated imaging reveals an EDV of 66, ESV of 28, LVEF of 58% without definitive wall motion abnormalities, and the TID ratio 0.93.  IMPRESSION: Overall low risk Lexiscan Cardiolite. There were no diagnostic ST segment abnormalities. Perfusion imaging shows evidence of probable soft tissue attenuation (less likely scar) affecting the apical lateral wall, without any large ischemic zones. LVEF is normal at 58% with normal volumes.   Electronically Signed   By: Rozann Lesches M.D.   On: 05/17/2014 13:40    Microbiology: Recent Results (from the past 240 hour(s))  MRSA PCR SCREENING     Status: None   Collection Time    05/15/14  9:00 PM      Result Value Ref Range Status   MRSA by PCR NEGATIVE  NEGATIVE Final   Comment:            The GeneXpert MRSA Assay (FDA     approved for NASAL specimens     only), is one component of a     comprehensive MRSA colonization     surveillance program. It is not     intended to diagnose MRSA     infection nor to guide or     monitor treatment for     MRSA infections.     Labs: Basic Metabolic Panel:  Recent Labs Lab 05/15/14 0229 05/16/14 0445 05/17/14 0455  NA 135* 138 138  K 4.1 3.7 4.1  CL 94* 94* 95*  CO2 28 34* 32  GLUCOSE 134* 133* 157*  BUN 10 14 17   CREATININE 0.63 0.68 0.65  CALCIUM 9.6 9.5 9.4   Liver Function Tests:  Recent Labs Lab 05/15/14 0229  AST 13  ALT 8  ALKPHOS 63  BILITOT 0.7  PROT 7.4  ALBUMIN 4.0   No results found for this basename: LIPASE, AMYLASE,  in  the last 168 hours No results found for this basename: AMMONIA,  in the last 168 hours CBC:  Recent Labs Lab 05/15/14 0229 05/17/14 0455  WBC 11.9* 9.0  HGB 11.4* 12.7  HCT 35.5* 39.1  MCV 91.0 90.3  PLT 175 233   Cardiac Enzymes:  Recent Labs Lab 05/15/14 0344 05/15/14 1654 05/15/14 2203 05/16/14 0445  TROPONINI <0.30 <0.30 <0.30 <0.30   BNP: BNP (last 3 results)  Recent Labs  10/23/13 1214 01/06/14 1025 05/15/14 0305  PROBNP 7918.0* 8105.0* 4899.0*   CBG:  Recent Labs Lab 05/16/14 1634 05/16/14 2116 05/17/14 0737 05/17/14 1124 05/17/14 1632  GLUCAP 193* 120* 173* 229* 96       Signed:  Lakoda Raske  [facility] 05/17/2014, 4:59 PM

## 2014-05-17 NOTE — Progress Notes (Addendum)
[facility] PROGRESS NOTE  Mary [name] [id number] DOB: 1950-10-30 DOA: 05/15/2014 PCP: Vic Blackbird, MD    Code Status: Full code Family Communication: Discussed with patient, family not available Disposition Plan: Discharge to home when clinically appropriate   Consultants:  Cardiology  Procedures: 1.  Cardiac stress test 05/17/14  2.  2-D echocardiogram 05/16/14:Study Conclusions - Left ventricle: The cavity size was normal. Wall thickness was normal. Systolic function was normal. The estimated ejection fraction was in the range of 55% to 60%. - Aortic valve: There was trivial regurgitation. - Left atrium: The atrium was mildly dilated. - Right atrium: The atrium was mildly dilated. - Pulmonary arteries: PA peak pressure: 38 mm Hg (S).    Antibiotics:  None  HPI/Subjective: The patient is sitting up in bed, getting ready for her cardiac stress test. She says that she is breathing better. She has some left-sided chest "soreness".  Objective: Filed Vitals:   05/17/14 0654  BP: 121/61  Pulse: 59  Temp: 98 F (36.7 C)  Resp: 20   oxygen saturation on supplement oxygen, 100%.  Intake/Output Summary (Last 24 hours) at 05/17/14 1008 Last data filed at 05/17/14 0800  Gross per 24 hour  Intake      0 ml  Output   1250 ml  Net  -1250 ml   Filed Weights   05/15/14 0144 05/16/14 0500 05/17/14 0654  Weight: 64.864 kg (143 lb) 63.504 kg (140 lb) 62.506 kg (137 lb 12.8 oz)    Exam:   General:  63 year-old woman sitting up in the bed, in no acute distress.  Cardiovascular: S1, S2, with a soft systolic murmur.  Chest wall. Healing sternotomy scar. Mild tenderness over the left pectoralis major/chest wall. No surrounding erythema or rash.  Respiratory: Rare crackles in the bases, breathing nonlabored.  Abdomen: Positive bowel cells, soft, nontender, nondistended.  Musculoskeletal/extremities: No acute hot red joints. No pedal edema.   Data  Reviewed: Basic Metabolic Panel:  Recent Labs Lab 05/15/14 0229 05/16/14 0445 05/17/14 0455  NA 135* 138 138  K 4.1 3.7 4.1  CL 94* 94* 95*  CO2 28 34* 32  GLUCOSE 134* 133* 157*  BUN 10 14 17   CREATININE 0.63 0.68 0.65  CALCIUM 9.6 9.5 9.4   Liver Function Tests:  Recent Labs Lab 05/15/14 0229  AST 13  ALT 8  ALKPHOS 63  BILITOT 0.7  PROT 7.4  ALBUMIN 4.0   No results found for this basename: LIPASE, AMYLASE,  in the last 168 hours No results found for this basename: AMMONIA,  in the last 168 hours CBC:  Recent Labs Lab 05/15/14 0229 05/17/14 0455  WBC 11.9* 9.0  HGB 11.4* 12.7  HCT 35.5* 39.1  MCV 91.0 90.3  PLT 175 233   Cardiac Enzymes:  Recent Labs Lab 05/15/14 0344 05/15/14 1654 05/15/14 2203 05/16/14 0445  TROPONINI <0.30 <0.30 <0.30 <0.30   BNP (last 3 results)  Recent Labs  10/23/13 1214 01/06/14 1025 05/15/14 0305  PROBNP 7918.0* 8105.0* 4899.0*   CBG:  Recent Labs Lab 05/16/14 0733 05/16/14 1126 05/16/14 1634 05/16/14 2116 05/17/14 0737  GLUCAP 144* 206* 193* 120* 173*    Recent Results (from the past 240 hour(s))  MRSA PCR SCREENING     Status: None   Collection Time    05/15/14  9:00 PM      Result Value Ref Range Status   MRSA by PCR NEGATIVE  NEGATIVE Final   Comment:  The GeneXpert MRSA Assay (FDA     approved for NASAL specimens     only), is one component of a     comprehensive MRSA colonization     surveillance program. It is not     intended to diagnose MRSA     infection nor to guide or     monitor treatment for     MRSA infections.     Studies: No results found.  Scheduled Meds: . antiseptic oral rinse  15 mL Mouth Rinse BID  . aspirin EC  81 mg Oral Daily  . atorvastatin  20 mg Oral q1800  . budesonide-formoterol  2 puff Inhalation BID  . enoxaparin (LOVENOX) injection  40 mg Subcutaneous Q24H  . FLUoxetine  40 mg Oral Daily  . furosemide  60 mg Intravenous Q12H  . furosemide  60 mg  Oral Daily  . insulin aspart  0-15 Units Subcutaneous TID WC  . insulin aspart  0-5 Units Subcutaneous QHS  . lisinopril  2.5 mg Oral Daily  . metoprolol tartrate  50 mg Oral BID  . potassium chloride  20 mEq Oral BID  . regadenoson      . regadenoson  0.4 mg Intravenous Once  . sodium chloride  3 mL Intravenous Q12H  . sodium chloride      . thiamine  100 mg Oral Daily   Continuous Infusions:   Assessment/plan: Principal Problem:   Acute diastolic CHF (congestive heart failure) Active Problems:   S/P CABG x 3   CAD (coronary artery disease)   Chest pain   HTN (hypertension)   PSVT (paroxysmal supraventricular tachycardia)   Diabetes mellitus   COPD (chronic obstructive pulmonary disease)   Anxiety   1. Acute diastolic heart failure. Echocardiogram on 7/22 revealed an ejection fraction of 55-60%, but no mention of diastolic function. Diastolic dysfunction is presumed. She has mild pulmonary hypertension. Clinically, she appears to be improving. She is diuresed a -4.2 L. Lasix changed from IV to 60 mg orally daily. Further recommendations per cardiology.  Episode of PSVT on 7/22. Per cardiology, this could have been  One of the causes of her acute diastolic failure. The plan was to increase metoprolol, but her dose remains at 50 mg twice a day. Further recommendations per cardiology. Her TSH is within normal limits at 0.69.  Chest pain in a patient with CAD and recent CABG x3. Her cardiac enzymes are negative. Her chest pain appears to be more chest wall in origin. Nevertheless, she is undergoing a cardiac stress test today per cardiology. Continue metoprolol, aspirin, oxygen, and statin.  Hypertension. The patient's blood pressures have been low-normal. Continue metoprolol and lisinopril with parameters to hold them if her systolic blood pressure is less than 95.  Type 2 diabetes mellitus. Currently controlled on sliding scale NovoLog. Her hemoglobin A1c is  7.2.  COPD. Currently stable. Continue inhaler.  Chronic anxiety. Currently stable. Continue when necessary Xanax and fluoxetine.    Time spent: 35 minutes.    [facility] Pager [phone]. If 7PM-7AM, please contact night-coverage at [email or web address], password [facility] 05/17/2014, 10:08 AM  LOS: 2 days

## 2014-05-17 NOTE — Progress Notes (Signed)
Patient's IV removed.  Site WNL.  AVS reviewed with patient.  Verbalized understanding of discharge orders, medication changes, physician follow-up.  Verbalized understanding of daily weights and heart failure education.  Stated that she has a working scale at home.  Reports all belongings intact and in possession at time of discharge.  Patient stable at time of discharge.

## 2014-05-17 NOTE — Progress Notes (Signed)
Primary cardiologist: Dr. Shelva Majestic  Subjective:   Still feels "tight" in chest, but breathing better. No palpitations.   Objective:   Temp:  [97.5 F (36.4 C)-98 F (36.7 C)] 98 F (36.7 C) (07/23 0654) Pulse Rate:  [58-71] 59 (07/23 0654) Resp:  [20] 20 (07/23 0654) BP: (99-121)/(49-61) 121/61 mmHg (07/23 0654) SpO2:  [98 %-100 %] 100 % (07/23 0654) Weight:  [137 lb 12.8 oz (62.506 kg)] 137 lb 12.8 oz (62.506 kg) (07/23 0654) Last BM Date: 05/16/14  Filed Weights   05/15/14 0144 05/16/14 0500 05/17/14 0654  Weight: 143 lb (64.864 kg) 140 lb (63.504 kg) 137 lb 12.8 oz (62.506 kg)    Intake/Output Summary (Last 24 hours) at 05/17/14 1016 Last data filed at 05/17/14 0800  Gross per 24 hour  Intake      0 ml  Output   1250 ml  Net  -1250 ml    Telemetry: Sinus rhythm with brief SVT.  Exam:  General: No distress.  Lungs: Clear, nonlabored.  Cardiac: RRR, no gallop.  Abdomen: NABS.  Extremities: No pitting.   Lab Results:  Basic Metabolic Panel:  Recent Labs Lab 05/15/14 0229 05/16/14 0445 05/17/14 0455  NA 135* 138 138  K 4.1 3.7 4.1  CL 94* 94* 95*  CO2 28 34* 32  GLUCOSE 134* 133* 157*  BUN 10 14 17   CREATININE 0.63 0.68 0.65  CALCIUM 9.6 9.5 9.4    Liver Function Tests:  Recent Labs Lab 05/15/14 0229  AST 13  ALT 8  ALKPHOS 63  BILITOT 0.7  PROT 7.4  ALBUMIN 4.0    CBC:  Recent Labs Lab 05/15/14 0229 05/17/14 0455  WBC 11.9* 9.0  HGB 11.4* 12.7  HCT 35.5* 39.1  MCV 91.0 90.3  PLT 175 233    Cardiac Enzymes:  Recent Labs Lab 05/15/14 1654 05/15/14 2203 05/16/14 0445  TROPONINI <0.30 <0.30 <0.30    BNP:  Recent Labs  10/23/13 1214 01/06/14 1025 05/15/14 0305  PROBNP 7918.0* 8105.0* 4899.0*    Echocardiogram 05/15/2014: Study Conclusions  - Left ventricle: The cavity size was normal. Wall thickness was normal. Systolic function was normal. The estimated ejection fraction was in the range of 55% to  60%. - Aortic valve: There was trivial regurgitation. - Left atrium: The atrium was mildly dilated. - Right atrium: The atrium was mildly dilated. - Pulmonary arteries: PA peak pressure: 38 mm Hg (S).    Medications:   Scheduled Medications: . antiseptic oral rinse  15 mL Mouth Rinse BID  . aspirin EC  81 mg Oral Daily  . atorvastatin  20 mg Oral q1800  . budesonide-formoterol  2 puff Inhalation BID  . enoxaparin (LOVENOX) injection  40 mg Subcutaneous Q24H  . FLUoxetine  40 mg Oral Daily  . furosemide  60 mg Oral Daily  . insulin aspart  0-15 Units Subcutaneous TID WC  . insulin aspart  0-5 Units Subcutaneous QHS  . lisinopril  2.5 mg Oral Daily  . metoprolol tartrate  50 mg Oral BID  . potassium chloride  20 mEq Oral BID  . regadenoson      . regadenoson  0.4 mg Intravenous Once  . sodium chloride  3 mL Intravenous Q12H  . sodium chloride      . thiamine  100 mg Oral Daily     PRN Medications: sodium chloride, acetaminophen, albuterol, ALPRAZolam, ondansetron (ZOFRAN) IV, sodium chloride, technetium sestamibi, traMADol   Assessment:   1. Acute diastolic heart  failure, LVEF 55-60%. Has diuresed approximately 4000 cc so far. Feeling better.  2. Multivessel CAD s/p CABG March 2015.  3. History of atrial fibrillation (LAA clip with CABG), maintaining sinus rhythm. Not anticoagulated at this time.   Plan/Discussion:    Patient evaluated in the stress lab. Lasix converted to PO this AM. Will followup on Lexiscan Cardiolite results today. If no large ischemic burden would plan to manage medically and add standing diuretic as outpatient.   Satira Sark, M.D., F.A.C.C.

## 2014-05-17 NOTE — Progress Notes (Signed)
Please see Lexiscan Cardiolite report - no significant ischemia noted. Would recommend medical therapy at this point.  Current doses of ASA, Toprol XL, Lipitor, Lisinopril, KCL. Would go with Lasix 40 mg daily. She will need to followup with Dr. Claiborne Billings or associate in 1-2 weeks at the [facility] office.  Satira Sark, M.D., F.A.C.C.

## 2014-05-17 NOTE — Progress Notes (Signed)
PT Cancellation Note  Patient Details Name: Mary [name] MRN: [id number] DOB: 1951/05/21   Cancelled Treatment:    Reason Eval/Treat Not Completed: Patient at procedure or test/unavailable  Received orders and chart reviewed.  Attempted PT evaluation, however pt currently at stress test.  Will re-attempt evaluation as able.     Gurshan Settlemire 05/17/2014, 10:23 AM

## 2014-05-17 NOTE — Progress Notes (Signed)
Stress test lab stated the patient was through with tests and that patient could eat lunch.  Dr. Caryn Section notified.

## 2014-05-17 NOTE — Progress Notes (Signed)
Stress Lab Nurses Notes - Mary [name]  Mary [name] 05/17/2014 Reason for doing test: Chest Pain Type of test: Leane Call Nurse performing test: Carvel Getting, RN Nuclear Medicine Tech: Redmond Baseman Echo Tech: Not Applicable MD performing test: Myles Gip Family MD: Vic Blackbird Test explained and consent signed: Yes.   IV started: 20g jelco, Saline lock flushed, No redness or edema and Saline lock from floor Symptoms: funny feeling all over, headache Treatment/Intervention: None Reason test stopped: protocol completed After recovery IV was: left Iv in Inpatient Patient to return to [place]. Med at :10.30 Patient discharged: Homeerror Back to room 303 Patient's Condition upon discharge was: stable Comments: Lexiscan performed. Rest Hr was 58 and rest BP was 101/67, Peak HR 86 and peak BP was 110/65. Symptoms resolved in rcovery. Norlene Duel

## 2014-05-17 NOTE — Progress Notes (Signed)
PT Cancellation Note  Patient Details Name: LINDSY COLAN MRN: [id number] DOB: 08-18-1951   Cancelled Treatment:    Reason Eval/Treat Not Completed: Patient declined, no reason specified  Pt back in room, PT evaluation re-attempted.  Pt reports a headache and diarrhea from medication given for stress test.  Pt requests PT to hold on evaluation today.  Educated pt on importance of assessment to determine further placement/services/DME.  Pt agreed to PT evaluation tomorrow morning.  Will re-attempt PT evaluation as able.     Ellianna Ruest 05/17/2014, 2:55 PM

## 2014-05-21 ENCOUNTER — Telehealth: Payer: Self-pay | Admitting: Family Medicine

## 2014-05-21 NOTE — Telephone Encounter (Signed)
Patient came home from the hospital on Thursday and was taking tramadol would to know if we could prescribe this for her because of her headaches  [place]

## 2014-05-21 NOTE — Telephone Encounter (Signed)
Call placed to patient and patient made aware.  

## 2014-05-21 NOTE — Telephone Encounter (Signed)
MD please advise

## 2014-05-21 NOTE — Telephone Encounter (Signed)
She is already on percocet, I dont not think it is best for her to mix these medications at this time.  She will need to use her current pain medicine or schedule  Or  OV

## 2014-05-29 ENCOUNTER — Telehealth: Payer: Self-pay | Admitting: *Deleted

## 2014-05-29 ENCOUNTER — Telehealth: Payer: Self-pay | Admitting: Family Medicine

## 2014-05-29 ENCOUNTER — Emergency Department (HOSPITAL_COMMUNITY)
Admission: EM | Admit: 2014-05-29 | Discharge: 2014-05-29 | Disposition: A | Discharge status: Home / Self Care | Payer: Medicare Other | Attending: Emergency Medicine | Admitting: Emergency Medicine

## 2014-05-29 ENCOUNTER — Encounter (HOSPITAL_COMMUNITY): Payer: Self-pay | Admitting: Emergency Medicine

## 2014-05-29 ENCOUNTER — Emergency Department (HOSPITAL_COMMUNITY): Payer: Medicare Other

## 2014-05-29 DIAGNOSIS — E785 Hyperlipidemia, unspecified: Secondary | ICD-10-CM | POA: Diagnosis not present

## 2014-05-29 DIAGNOSIS — I4891 Unspecified atrial fibrillation: Secondary | ICD-10-CM | POA: Insufficient documentation

## 2014-05-29 DIAGNOSIS — I509 Heart failure, unspecified: Secondary | ICD-10-CM | POA: Insufficient documentation

## 2014-05-29 DIAGNOSIS — Z79899 Other long term (current) drug therapy: Secondary | ICD-10-CM | POA: Diagnosis not present

## 2014-05-29 DIAGNOSIS — E119 Type 2 diabetes mellitus without complications: Secondary | ICD-10-CM | POA: Insufficient documentation

## 2014-05-29 DIAGNOSIS — I252 Old myocardial infarction: Secondary | ICD-10-CM | POA: Diagnosis not present

## 2014-05-29 DIAGNOSIS — Z7982 Long term (current) use of aspirin: Secondary | ICD-10-CM | POA: Diagnosis not present

## 2014-05-29 DIAGNOSIS — R42 Dizziness and giddiness: Secondary | ICD-10-CM | POA: Diagnosis not present

## 2014-05-29 DIAGNOSIS — R079 Chest pain, unspecified: Secondary | ICD-10-CM | POA: Diagnosis present

## 2014-05-29 DIAGNOSIS — Z8739 Personal history of other diseases of the musculoskeletal system and connective tissue: Secondary | ICD-10-CM | POA: Insufficient documentation

## 2014-05-29 DIAGNOSIS — Z87891 Personal history of nicotine dependence: Secondary | ICD-10-CM | POA: Insufficient documentation

## 2014-05-29 DIAGNOSIS — R110 Nausea: Secondary | ICD-10-CM | POA: Insufficient documentation

## 2014-05-29 DIAGNOSIS — Z951 Presence of aortocoronary bypass graft: Secondary | ICD-10-CM | POA: Diagnosis not present

## 2014-05-29 DIAGNOSIS — G8929 Other chronic pain: Secondary | ICD-10-CM | POA: Diagnosis not present

## 2014-05-29 DIAGNOSIS — J45901 Unspecified asthma with (acute) exacerbation: Secondary | ICD-10-CM

## 2014-05-29 DIAGNOSIS — J441 Chronic obstructive pulmonary disease with (acute) exacerbation: Secondary | ICD-10-CM | POA: Insufficient documentation

## 2014-05-29 DIAGNOSIS — Z91040 Latex allergy status: Secondary | ICD-10-CM | POA: Insufficient documentation

## 2014-05-29 DIAGNOSIS — I10 Essential (primary) hypertension: Secondary | ICD-10-CM | POA: Insufficient documentation

## 2014-05-29 LAB — CBC
HCT: 42.7 % (ref 36.0–46.0)
HEMOGLOBIN: 14.3 g/dL (ref 12.0–15.0)
MCH: 29.8 pg (ref 26.0–34.0)
MCHC: 33.5 g/dL (ref 30.0–36.0)
MCV: 89 fL (ref 78.0–100.0)
PLATELETS: 216 10*3/uL (ref 150–400)
RBC: 4.8 MIL/uL (ref 3.87–5.11)
RDW: 15.4 % (ref 11.5–15.5)
WBC: 13.5 10*3/uL — AB (ref 4.0–10.5)

## 2014-05-29 LAB — BASIC METABOLIC PANEL
Anion gap: 19 — ABNORMAL HIGH (ref 5–15)
BUN: 26 mg/dL — ABNORMAL HIGH (ref 6–23)
CHLORIDE: 90 meq/L — AB (ref 96–112)
CO2: 24 meq/L (ref 19–32)
CREATININE: 0.89 mg/dL (ref 0.50–1.10)
Calcium: 10.6 mg/dL — ABNORMAL HIGH (ref 8.4–10.5)
GFR calc Af Amer: 78 mL/min — ABNORMAL LOW (ref >90)
GFR calc non Af Amer: 67 mL/min — ABNORMAL LOW (ref >90)
GLUCOSE: 204 mg/dL — AB (ref 70–99)
Potassium: 4.1 mEq/L (ref 3.7–5.3)
SODIUM: 133 meq/L — AB (ref 137–147)

## 2014-05-29 LAB — TROPONIN I: Troponin I: <0.3 ng/mL (ref <0.30)

## 2014-05-29 LAB — PRO B NATRIURETIC PEPTIDE: Pro B Natriuretic peptide (BNP): 1077 pg/mL — ABNORMAL HIGH (ref 0–125)

## 2014-05-29 LAB — D-DIMER, QUANTITATIVE: D-Dimer, Quant: 0.44 ug/mL-FEU (ref 0.00–0.48)

## 2014-05-29 MED ORDER — ASPIRIN 81 MG PO CHEW
324.0000 mg | CHEWABLE_TABLET | Freq: Once | ORAL | Status: AC
  Administered 2014-05-29: 324 mg via ORAL
  Filled 2014-05-29: qty 4

## 2014-05-29 MED ORDER — IPRATROPIUM-ALBUTEROL 0.5-2.5 (3) MG/3ML IN SOLN: 3.0000 mL | Freq: Once | RESPIRATORY_TRACT | Status: DC

## 2014-05-29 MED ORDER — FENTANYL CITRATE 0.05 MG/ML IJ SOLN
50.0000 ug | Freq: Once | INTRAMUSCULAR | Status: AC
  Administered 2014-05-29: 50 ug via INTRAVENOUS
  Filled 2014-05-29: qty 2

## 2014-05-29 MED ORDER — OXYCODONE-ACETAMINOPHEN 7.5-325 MG PO TABS: 1.0000 | ORAL_TABLET | Freq: Two times a day (BID) | ORAL | Status: DC | PRN

## 2014-05-29 NOTE — Telephone Encounter (Signed)
Ok to refill??  Last office visit 03/07/2014.  Last refill 05/04/2014.

## 2014-05-29 NOTE — ED Provider Notes (Signed)
CSN: [id number]     Arrival date & time 05/29/14  1311 History  This chart was scribed for Ephraim Hamburger, MD by Irene Pap, ED Scribe. This patient was seen in room APA02/APA02 and patient care was started at 1:40 PM.   Chief Complaint  Patient presents with  . Chest Pain   The history is provided by the patient. No language interpreter was used.   HPI Comments: Mary [name] is a 63 y.o. female with a history of DM and COPD who presents to the Emergency Department complaining of worsening intermittent tight chest pain onset one day ago. Patient states that the pain feels like someone is "twisting" her chest. She reports associated intermittent SOB, productive cough, nausea when laying down, dizziness with heavy breathing, chest tenderness, and pain with breathing. 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 She states that she sees Dr. Claiborne Billings. Patient states that she has had a spike in her blood sugar, which was 220 this morning. She reports that she has been taking her Lasix as prescribed to no relief. Patient denies fever and leg swelling.    Past Medical History  Diagnosis Date  . Diabetes mellitus   . Arthritis   . Anxiety   . Chronic pain     Bacl pain, Disc L5-S1- Dr. Joya Salm in [place]  . Hyperlipidemia   . Hypertension   . Tachycardia   . Bronchial asthma   . Atrial fibrillation   . COPD (chronic obstructive pulmonary disease)   . DDD (degenerative disc disease)     Radicular symptoms  . Shortness of breath   . CHF (congestive heart failure)   . Hx of echocardiogram     Was interpreted by Dr Doylene Canard that showed an Ef in the 50-60% range with grade 1 diastolic dysfunction. she had moderate MR, biatrial enlargement, moderate TR and at that time estimated RV systolic pressure was 43 mm.  . History of stress test 06/2011    Abnormal myocardial perfusion study.   Past Surgical  History  Procedure Laterality Date  . Carpal tunnel release      x 2  . Breast cyst removed    . Abdominal hysterectomy      partial  . Tubal ligation    . Tonsillectomy    . Coronary artery bypass graft N/A 01/09/2014    Procedure: CORONARY ARTERY BYPASS GRAFTING (CABG) x 3 using endoscopically harvested right saphenous vein and left internal mammary artery and closure of left atrial appendage;  Surgeon: Ivin Poot, MD;  Location: [facility];  Service: Open Heart Surgery;  Laterality: N/A;  patient has preop IA BP   . Intraoperative transesophageal echocardiogram N/A 01/09/2014    Procedure: INTRAOPERATIVE TRANSESOPHAGEAL ECHOCARDIOGRAM;  Surgeon: Ivin Poot, MD;  Location: [facility];  Service: Open Heart Surgery;  Laterality: N/A;   Family History  Problem Relation Age of Onset  . Diabetes Mother   . Heart disease Mother   . Diabetes Sister   . Hypertension Sister   . Hyperlipidemia Sister   . Diabetes Brother   . Heart disease Brother   . Diabetes Brother   . Heart disease Brother    History  Substance Use Topics  . Smoking status: Former Smoker    Types: Cigarettes  . Smokeless tobacco: Never Used     Comment: quit 6 months  ago  . Alcohol Use: No   OB History   Grav Para Term Preterm Abortions TAB SAB Ect Mult Living                 Review of Systems  Constitutional: Negative for fever.  Respiratory: Positive for cough and shortness of breath.   Cardiovascular: Positive for chest pain. Negative for leg swelling.  Gastrointestinal: Positive for nausea.  Neurological: Positive for dizziness.  All other systems reviewed and are negative.     Allergies  Adhesive; Latex; and Zocor  Home Medications   Prior to Admission medications   Medication Sig Start Date End Date Taking? Authorizing Provider  albuterol (PROAIR HFA) 108 (90 BASE) MCG/ACT inhaler Inhale 2 puffs into the lungs every 6 (six) hours as needed for wheezing or shortness of breath.     Historical  Provider, MD  ALPRAZolam Duanne Moron) 1 MG tablet Take 1 tablet (1 mg total) by mouth 2 (two) times daily as needed for anxiety. 05/14/14   Alycia Rossetti, MD  aspirin EC 81 MG tablet Take 81 mg by mouth daily.    Historical Provider, MD  atorvastatin (LIPITOR) 20 MG tablet Take 1 tablet (20 mg total) by mouth daily. 11/27/13   Alycia Rossetti, MD  budesonide-formoterol [facility]) 160-4.5 MCG/ACT inhaler Inhale 2 puffs into the lungs 2 (two) times daily. 10/27/13   Nita Sells, MD  Canagliflozin (INVOKANA) 100 MG TABS Take 1 tablet (100 mg total) by mouth daily. 04/10/14   Alycia Rossetti, MD  ferrous [id number] C-folic acid ([name] / FOLTRIN) capsule Take 1 capsule by mouth daily. 03/07/14   Alycia Rossetti, MD  FLUoxetine (PROZAC) 40 MG capsule Take 1 capsule (40 mg total) by mouth daily. 04/16/14   Alycia Rossetti, MD  furosemide (LASIX) 40 MG tablet Take 1 tablet (40 mg total) by mouth daily. Take daily. 05/17/14   Rexene Alberts, MD  lisinopril (PRINIVIL,ZESTRIL) 2.5 MG tablet Take 1 tablet (2.5 mg total) by mouth daily. 03/20/14   Troy Sine, MD  metoprolol tartrate (LOPRESSOR) 25 MG tablet Take 1 tablet (25 mg total) by mouth 3 (three) times daily. Now take 1 tablet 3 times daily. 05/17/14   Rexene Alberts, MD  ondansetron [facility]) 4 MG tablet take 1 tablet by mouth every 6 hours if needed for nausea 04/25/14   Alycia Rossetti, MD  oxyCODONE-acetaminophen (PERCOCET) 7.5-325 MG per tablet Take 1 tablet by mouth 2 (two) times daily as needed for pain. 05/29/14   Alycia Rossetti, MD  potassium chloride SA (K-DUR,KLOR-CON) 20 MEQ tablet Take 1 tablet (20 mEq total) by mouth daily. Take with furosemide (Lasix). 05/18/14   Rexene Alberts, MD  sitaGLIPtin (JANUVIA) 100 MG tablet take 1 tablet by mouth once daily 04/02/14   Alycia Rossetti, MD  sodium chloride (OCEAN) 0.65 % nasal spray Place 1 spray into the nose as needed for congestion. Congestion    Historical Provider, MD  thiamine 100  MG tablet Take 1 tablet (100 mg total) by mouth daily. 10/27/13   Nita Sells, MD   BP 92/65  Pulse 65  Temp(Src) 98 F (36.7 C) (Oral)  Resp 19  Ht 5\' 7"  (1.702 m)  Wt 130 lb (58.968 kg)  BMI 20.36 kg/m2  SpO2 100%  Physical Exam  Nursing note and vitals reviewed. Constitutional: She is oriented to person, place, and time. She appears well-developed and well-nourished.  HENT:  Head: Normocephalic and atraumatic.  Eyes: EOM are normal.  Neck: Normal range of motion. Neck supple.  Cardiovascular: Normal rate.   Pulmonary/Chest: Effort normal. She has no wheezes. She has no rales. She exhibits tenderness.  Musculoskeletal: Normal range of motion. She exhibits no edema.  No lower extremity edema  Neurological: She is alert and oriented to person, place, and time.  Skin: Skin is warm and dry.  Psychiatric: She has a normal mood and affect. Her behavior is normal.    ED Course  Procedures (including critical care time) DIAGNOSTIC STUDIES: Oxygen Saturation is 100% on room air, normal by my interpretation.    COORDINATION OF CARE: 1:46 PM-Discussed treatment plan which includes labs and CT scan with pt at bedside and pt agreed to plan.   Labs Review Labs Reviewed  CBC - Abnormal; Notable for the following:    WBC 13.5 (*)    All other components within normal limits  BASIC METABOLIC PANEL - Abnormal; Notable for the following:    Sodium 133 (*)    Chloride 90 (*)    Glucose, Bld 204 (*)    BUN 26 (*)    Calcium 10.6 (*)    GFR calc non Af Amer 67 (*)    GFR calc Af Amer 78 (*)    Anion gap 19 (*)    All other components within normal limits  PRO B NATRIURETIC PEPTIDE - Abnormal; Notable for the following:    Pro B Natriuretic peptide (BNP) 1077.0 (*)    All other components within normal limits  TROPONIN I  D-DIMER, QUANTITATIVE  TROPONIN I   Imaging Review Dg Chest 2 View  05/29/2014   CLINICAL DATA:  Chest pain, weakness  EXAM: CHEST  2 VIEW  COMPARISON:   05/15/2014  FINDINGS: Cardiomediastinal silhouette is stable. Status post CABG. No acute infiltrate or pleural effusion. No pulmonary edema. Bony thorax is unremarkable.  IMPRESSION: No active cardiopulmonary disease.   Electronically Signed   By: Lahoma Crocker M.D.   On: 05/29/2014 14:16     EKG Interpretation Date/Time:  Tuesday May 29 2014 13:16:56 EDT Ventricular Rate:  64 PR Interval:  135 QRS Duration: 127 QT Interval:  459 QTC Calculation: 474 R Axis:   133 Text Interpretation:  Sinus rhythm RBBB and LPFB Inferior infarct, age  indeterminate ST depr, consider ischemia, anterolateral lds ST depressions  similar to July 2015 Confirmed by Regenia Skeeter  MD, [place] [phone]) on 05/29/2014  1:24:03 PM      MDM   Final diagnoses:  Chest pain, unspecified chest pain type    There is no obvious etiology the patient's pain and shortness of breath. Unlikely to be pulmonary embolism and has a negative d-dimer. EKG is abnormal but no changes noted since 1-2 weeks ago. No evidence of fluid overload on exam or lab work. At this time as she has had a resolution of her pain and had a recent negative stress test I feel that delta troponin strategies appropriate imaging follow up with her cardiologist as recommended.  I personally performed the services described in this documentation, which was scribed in my presence. The recorded information has been reviewed and is accurate.     Ephraim Hamburger, MD 05/29/14 [phone]

## 2014-05-29 NOTE — ED Notes (Signed)
Chest pain , onset this am. With sob,

## 2014-05-29 NOTE — Telephone Encounter (Signed)
[phone]  Pt is needing a refill on oxyCODONE-acetaminophen (PERCOCET) 7.5-325 MG per tablet

## 2014-05-29 NOTE — Discharge Instructions (Signed)
Chest Pain (Nonspecific) °It is often hard to give a specific diagnosis for the cause of chest pain. There is always a chance that your pain could be related to something serious, such as a heart attack or a blood clot in the lungs. You need to follow up with your health care provider for further evaluation. °CAUSES  °· Heartburn. °· Pneumonia or bronchitis. °· Anxiety or stress. °· Inflammation around your heart (pericarditis) or lung (pleuritis or pleurisy). °· A blood clot in the lung. °· A collapsed lung (pneumothorax). It can develop suddenly on its own (spontaneous pneumothorax) or from trauma to the chest. °· Shingles infection (herpes zoster virus). °The chest wall is composed of bones, muscles, and cartilage. Any of these can be the source of the pain. °· The bones can be bruised by injury. °· The muscles or cartilage can be strained by coughing or overwork. °· The cartilage can be affected by inflammation and become sore (costochondritis). °DIAGNOSIS  °Lab tests or other studies may be needed to find the cause of your pain. Your health care provider may have you take a test called an ambulatory electrocardiogram (ECG). An ECG records your heartbeat patterns over a 24-hour period. You may also have other tests, such as: °· Transthoracic echocardiogram (TTE). During echocardiography, sound waves are used to evaluate how blood flows through your heart. °· Transesophageal echocardiogram (TEE). °· Cardiac monitoring. This allows your health care provider to monitor your heart rate and rhythm in real time. °· Holter monitor. This is a portable device that records your heartbeat and can help diagnose heart arrhythmias. It allows your health care provider to track your heart activity for several days, if needed. °· Stress tests by exercise or by giving medicine that makes the heart beat faster. °TREATMENT  °· Treatment depends on what may be causing your chest pain. Treatment may include: °· Acid blockers for  heartburn. °· Anti-inflammatory medicine. °· Pain medicine for inflammatory conditions. °· Antibiotics if an infection is present. °· You may be advised to change lifestyle habits. This includes stopping smoking and avoiding alcohol, caffeine, and chocolate. °· You may be advised to keep your head raised (elevated) when sleeping. This reduces the chance of acid going backward from your stomach into your esophagus. °Most of the time, nonspecific chest pain will improve within 2-3 days with rest and mild pain medicine.  °HOME CARE INSTRUCTIONS  °· If antibiotics were prescribed, take them as directed. Finish them even if you start to feel better. °· For the next few days, avoid physical activities that bring on chest pain. Continue physical activities as directed. °· Do not use any tobacco products, including cigarettes, chewing tobacco, or electronic cigarettes. °· Avoid drinking alcohol. °· Only take medicine as directed by your health care provider. °· Follow your health care provider's suggestions for further testing if your chest pain does not go away. °· Keep any follow-up appointments you made. If you do not go to an appointment, you could develop lasting (chronic) problems with pain. If there is any problem keeping an appointment, call to reschedule. °SEEK MEDICAL CARE IF:  °· Your chest pain does not go away, even after treatment. °· You have a rash with blisters on your chest. °· You have a fever. °SEEK IMMEDIATE MEDICAL CARE IF:  °· You have increased chest pain or pain that spreads to your arm, neck, jaw, back, or abdomen. °· You have shortness of breath. °· You have an increasing cough, or you cough   up blood. °· You have severe back or abdominal pain. °· You feel nauseous or vomit. °· You have severe weakness. °· You faint. °· You have chills. °This is an emergency. Do not wait to see if the pain will go away. Get medical help at once. Call your local emergency services (911 in [place] Do not drive  yourself to the hospital. °MAKE SURE YOU:  °· Understand these instructions. °· Will watch your condition. °· Will get help right away if you are not doing well or get worse. °Document Released: 07/22/2005 Document Revised: 10/17/2013 Document Reviewed: 05/17/2008 °ExitCare® Patient Information ©2015 [employer]. This information is not intended to replace advice given to you by your health care provider. Make sure you discuss any questions you have with your health care provider. °Shortness of Breath °Shortness of breath means you have trouble breathing. It could also mean that you have a medical problem. You should get immediate medical care for shortness of breath. °CAUSES  °· Not enough oxygen in the air such as with high altitudes or a smoke-filled room. °· Certain lung diseases, infections, or problems. °· Heart disease or conditions, such as angina or heart failure. °· Low red blood cells (anemia). °· Poor physical fitness, which can cause shortness of breath when you exercise. °· Chest or back injuries or stiffness. °· Being overweight. °· Smoking. °· Anxiety, which can make you feel like you are not getting enough air. °DIAGNOSIS  °Serious medical problems can often be found during your physical exam. Tests may also be done to determine why you are having shortness of breath. Tests may include: °· Chest X-rays. °· Lung function tests. °· Blood tests. °· An electrocardiogram (ECG). °· An ambulatory electrocardiogram. An ambulatory ECG records your heartbeat patterns over a 24-hour period. °· Exercise testing. °· A transthoracic echocardiogram (TTE). During echocardiography, sound waves are used to evaluate how blood flows through your heart. °· A transesophageal echocardiogram (TEE). °· Imaging scans. °Your health care provider may not be able to find a cause for your shortness of breath after your exam. In this case, it is important to have a follow-up exam with your health care provider as directed.    °TREATMENT  °Treatment for shortness of breath depends on the cause of your symptoms and can vary greatly. °HOME CARE INSTRUCTIONS  °· Do not smoke. Smoking is a common cause of shortness of breath. If you smoke, ask for help to quit. °· Avoid being around chemicals or things that may bother your breathing, such as paint fumes and dust. °· Rest as needed. Slowly resume your usual activities. °· If medicines were prescribed, take them as directed for the full length of time directed. This includes oxygen and any inhaled medicines. °· Keep all follow-up appointments as directed by your health care provider. °SEEK MEDICAL CARE IF:  °· Your condition does not improve in the time expected. °· You have a hard time doing your normal activities even with rest. °· You have any new symptoms. °SEEK IMMEDIATE MEDICAL CARE IF:  °· Your shortness of breath gets worse. °· You feel light-headed, faint, or develop a cough not controlled with medicines. °· You start coughing up blood. °· You have pain with breathing. °· You have chest pain or pain in your arms, shoulders, or abdomen. °· You have a fever. °· You are unable to walk up stairs or exercise the way you normally do. °MAKE SURE YOU: °· Understand these instructions. °· Will watch your condition. °·   Will get help right away if you are not doing well or get worse. °Document Released: 07/07/2001 Document Revised: 10/17/2013 Document Reviewed: 12/28/2011 °ExitCare® Patient Information ©2015 [employer]. This information is not intended to replace advice given to you by your health care provider. Make sure you discuss any questions you have with your health care provider. ° °

## 2014-05-29 NOTE — Telephone Encounter (Signed)
Pt called stating she is needing to go to ED d/t feeling like her lungs are filling up again, was just in ED last week for same symptoms , pt just wanted to know if she should go to [facility] or [facility], I told the pt she needed to go to which hospital was closer to her, by listening to pt I could tell she was having trouble, I told pt that she needed to call 911 and have them to come and take her to hospital. Pt is calling now.

## 2014-05-29 NOTE — ED Notes (Signed)
PT c/o sob and chest pain started this am and worsening this evening. PT stated she was admitted for fluid on her lungs last week and recently started lasix and potassium medication. No peripheral edema noted.

## 2014-05-29 NOTE — ED Notes (Signed)
MD at bedside. 

## 2014-05-29 NOTE — Telephone Encounter (Signed)
You can go ahead and print but she is too early for refill she can pick up Friday

## 2014-05-29 NOTE — Telephone Encounter (Signed)
Prescription printed and patient made aware to come to office to pick up.  

## 2014-05-29 NOTE — ED Notes (Signed)
Lab tech at bedside drawing repeat troponin lab.

## 2014-05-30 ENCOUNTER — Other Ambulatory Visit: Payer: Self-pay | Admitting: *Deleted

## 2014-05-30 MED ORDER — ONDANSETRON HCL 4 MG PO TABS: ORAL_TABLET | ORAL | Status: DC

## 2014-05-30 NOTE — Telephone Encounter (Signed)
Received fax requesting refill on Zofran.   Refill appropriate and filled per protocol.

## 2014-06-04 ENCOUNTER — Ambulatory Visit (INDEPENDENT_AMBULATORY_CARE_PROVIDER_SITE_OTHER): Payer: Medicare Other | Admitting: Physician Assistant

## 2014-06-04 ENCOUNTER — Encounter: Payer: Self-pay | Admitting: Physician Assistant

## 2014-06-04 VITALS — BP 112/54 | HR 59 | Ht 67.0 in | Wt 138.2 lb

## 2014-06-04 DIAGNOSIS — I5032 Chronic diastolic (congestive) heart failure: Secondary | ICD-10-CM

## 2014-06-04 DIAGNOSIS — R079 Chest pain, unspecified: Secondary | ICD-10-CM

## 2014-06-04 DIAGNOSIS — I4891 Unspecified atrial fibrillation: Secondary | ICD-10-CM

## 2014-06-04 DIAGNOSIS — I480 Paroxysmal atrial fibrillation: Secondary | ICD-10-CM

## 2014-06-04 DIAGNOSIS — Z951 Presence of aortocoronary bypass graft: Secondary | ICD-10-CM

## 2014-06-04 DIAGNOSIS — I2583 Coronary atherosclerosis due to lipid rich plaque: Secondary | ICD-10-CM

## 2014-06-04 DIAGNOSIS — I10 Essential (primary) hypertension: Secondary | ICD-10-CM

## 2014-06-04 DIAGNOSIS — F419 Anxiety disorder, unspecified: Secondary | ICD-10-CM

## 2014-06-04 DIAGNOSIS — F411 Generalized anxiety disorder: Secondary | ICD-10-CM

## 2014-06-04 DIAGNOSIS — I2510 Atherosclerotic heart disease of native coronary artery without angina pectoris: Secondary | ICD-10-CM

## 2014-06-04 DIAGNOSIS — I509 Heart failure, unspecified: Secondary | ICD-10-CM

## 2014-06-04 NOTE — Patient Instructions (Addendum)
1.  Follow up with Dr. Claiborne Billings in three months.  2.  "[email or web address]" for meditation

## 2014-06-04 NOTE — Progress Notes (Signed)
Date:  06/04/2014   ID:  Mary [name], DOB July 24, 1951, MRN [id number]  PCP:  Vic Blackbird, MD  Primary Cardiologist:  Claiborne Billings, t     History of Present Illness: Mary [name] is a 63 y.o. female who has history of afib, HTN, tobacco use(quit a year ago, COPD, diastolic CHF.  She also has 3 V CAD and underwent CABG in 01/09/14 (LIMA to LAD; SVG to ramus, SVG to LCx; L atrial clip applied.  Post op LVEF 30% that was 55-60% on an echo last month when she was hospitalized for acute diastolic heart failure  She had a NST for CP which was low risk.  She presents today for follow up and states the chest tightness/numbness/"weird feeling has continued since the CABG.  It is revealed with Xanax.  She also has persistent SOB but no orthopnea or LEE.    The patient currently denies nausea, vomiting, fever, dizziness, PND, cough, congestion, abdominal pain, hematochezia, melena, .   Echo  Study Conclusions  - Left ventricle: The cavity size was normal. Wall thickness was   normal. Systolic function was normal. The estimated ejection   fraction was in the range of 55% to 60%. - Aortic valve: There was trivial regurgitation. - Left atrium: The atrium was mildly dilated. - Right atrium: The atrium was mildly dilated. - Pulmonary arteries: PA peak pressure: 38 mm Hg (S).     Wt Readings from Last 3 Encounters:  06/04/14 138 lb 3.2 oz (62.687 kg)  05/29/14 130 lb (58.968 kg)  05/17/14 137 lb 12.8 oz (62.506 kg)     Past Medical History  Diagnosis Date  . Diabetes mellitus   . Arthritis   . Anxiety   . Chronic pain     Bacl pain, Disc L5-S1- Dr. Joya Salm in [place]  . Hyperlipidemia   . Hypertension   . Tachycardia   . Bronchial asthma   . Atrial fibrillation   . COPD (chronic obstructive pulmonary disease)   . DDD (degenerative disc disease)     Radicular symptoms  . Shortness of breath   . CHF (congestive heart failure)   . Hx of echocardiogram     Was interpreted by Dr Doylene Canard that  showed an Ef in the 50-60% range with grade 1 diastolic dysfunction. she had moderate MR, biatrial enlargement, moderate TR and at that time estimated RV systolic pressure was 43 mm.  . History of stress test 06/2011    Abnormal myocardial perfusion study.    Current Outpatient Prescriptions  Medication Sig Dispense Refill  . albuterol (PROAIR HFA) 108 (90 BASE) MCG/ACT inhaler Inhale 2 puffs into the lungs every 6 (six) hours as needed for wheezing or shortness of breath.       . ALPRAZolam (XANAX) 1 MG tablet Take 1 tablet (1 mg total) by mouth 2 (two) times daily as needed for anxiety.  60 tablet  0  . aspirin EC 81 MG tablet Take 81 mg by mouth daily.      Marland Kitchen atorvastatin (LIPITOR) 20 MG tablet Take 20 mg by mouth at bedtime.      . budesonide-formoterol (SYMBICORT) 160-4.5 MCG/ACT inhaler Inhale 2 puffs into the lungs 2 (two) times daily.  1 Inhaler  12  . Canagliflozin (INVOKANA) 100 MG TABS Take 1 tablet (100 mg total) by mouth daily.  30 tablet  6  . ferrous [id number] C-folic acid ([name] / FOLTRIN) capsule Take 1 capsule by mouth daily.  30 capsule  6  .  FLUoxetine (PROZAC) 40 MG capsule Take 1 capsule (40 mg total) by mouth daily.  30 capsule  3  . furosemide (LASIX) 40 MG tablet Take 1 tablet (40 mg total) by mouth daily. Take daily.  30 tablet  2  . lisinopril (PRINIVIL,ZESTRIL) 2.5 MG tablet Take 1 tablet (2.5 mg total) by mouth daily.  30 tablet  5  . metoprolol tartrate (LOPRESSOR) 25 MG tablet Take 1 tablet (25 mg total) by mouth 3 (three) times daily. Now take 1 tablet 3 times daily.  90 tablet  1  . ondansetron (ZOFRAN) 4 MG tablet take 1 tablet by mouth every 6 hours if needed for nausea  20 tablet  0  . oxyCODONE-acetaminophen (PERCOCET) 7.5-325 MG per tablet Take 1 tablet by mouth 2 (two) times daily as needed for pain.  60 tablet  0  . potassium chloride SA (K-DUR,KLOR-CON) 20 MEQ tablet Take 1 tablet (20 mEq total) by mouth daily. Take with furosemide (Lasix).   30 tablet  2  . promethazine (PHENERGAN) 12.5 MG tablet Take 1 tablet by mouth every 6 (six) hours as needed.      . sitaGLIPtin (JANUVIA) 100 MG tablet take 1 tablet by mouth once daily  30 tablet  3  . sodium chloride (OCEAN) 0.65 % nasal spray Place 1 spray into the nose as needed for congestion. Congestion      . thiamine 100 MG tablet Take 1 tablet (100 mg total) by mouth daily.  30 tablet  0   No current facility-administered medications for this visit.    Allergies:    Allergies  Allergen Reactions  . Adhesive [Tape] Other (See Comments)    Tears skin off.   . Latex Other (See Comments)    In tape, tears skin off.  . Zocor [Simvastatin - High Dose] Nausea And Vomiting    Social History:  The patient  reports that she quit smoking about a year ago. Her smoking use included Cigarettes. She smoked 0.00 packs per day. She has never used smokeless tobacco. She reports that she does not drink alcohol or use illicit drugs.   Family history:   Family History  Problem Relation Age of Onset  . Diabetes Mother   . Heart disease Mother   . Diabetes Sister   . Hypertension Sister   . Hyperlipidemia Sister   . Diabetes Brother   . Heart disease Brother   . Diabetes Brother   . Heart disease Brother     ROS:  Please see the history of present illness.  All other systems reviewed and negative.   PHYSICAL EXAM: VS:  BP 112/54  Pulse 59  Ht 5\' 7"  (1.702 m)  Wt 138 lb 3.2 oz (62.687 kg)  BMI 21.64 kg/m2 Well nourished, well developed, in no acute distress HEENT: Pupils are equal round react to light accommodation extraocular movements are intact.  Neck: no JVDNo cervical lymphadenopathy. Cardiac: Regular rate and rhythm without murmurs rubs or gallops. Chest wall: Nontender no ecchymosis or erythema Lungs:  clear to auscultation bilaterally, no wheezing, rhonchi or rales Abd: soft, nontender, positive bowel sounds all quadrants, no hepatosplenomegaly Ext: no lower extremity  edema.  2+ radial and dorsalis pedis pulses. Skin: warm and dry Neuro:  Grossly normal  EKG:  Sinus bradycardia rate 59 beats per minute right bundle branch block   ASSESSMENT AND PLAN:  Problem List Items Addressed This Visit   CAD (coronary artery disease) (Chronic)   Anxiety   Diastolic congestive heart  failure     Appears euvolemic.    Atrial fibrillation     Maintaining sinus bradycardia 59.     S/P CABG x 3   Chest pain - Primary     Patient continues to complain of chest pain/unusual feeling just left of her sternum. It is likely related to anxiety as it apparently resolves after taking Xanax.  Talked about some stress exercises that she can try.    Relevant Orders      EKG 12-Lead   HTN (hypertension)     Blood pressure is well controlled. No change in therapy

## 2014-06-04 NOTE — Assessment & Plan Note (Signed)
Maintaining sinus bradycardia 59.

## 2014-06-04 NOTE — Assessment & Plan Note (Signed)
Blood pressure is well controlled. No change in therapy

## 2014-06-04 NOTE — Assessment & Plan Note (Addendum)
Patient continues to complain of chest pain/unusual feeling just left of her sternum. It is likely related to anxiety as it apparently resolves after taking Xanax.  Talked about some stress exercises that she can try.

## 2014-06-04 NOTE — Assessment & Plan Note (Signed)
Appears euvolemic  

## 2014-06-11 ENCOUNTER — Other Ambulatory Visit: Payer: Self-pay | Admitting: Family Medicine

## 2014-06-11 NOTE — Telephone Encounter (Signed)
okay

## 2014-06-11 NOTE — Telephone Encounter (Signed)
Medication called to pharmacy. 

## 2014-06-11 NOTE — Telephone Encounter (Signed)
Ok to refill??  Last office visit 03/07/2014.  Last refill 05/14/2014.

## 2014-06-18 ENCOUNTER — Telehealth: Payer: Self-pay | Admitting: Family Medicine

## 2014-06-18 NOTE — Telephone Encounter (Signed)
Patient says she has psoriasis and would like to know if she can take benadryl for her itching please call her back at [phone]

## 2014-06-18 NOTE — Telephone Encounter (Signed)
Call placed to patient.   States that she has itching with areas on face and hands that look like psoriasis.   Requested to know if she could take Benadryl PO and if it would interact with her current medications.   MD please advise.

## 2014-06-19 ENCOUNTER — Encounter: Payer: Self-pay | Admitting: Family Medicine

## 2014-06-19 NOTE — Telephone Encounter (Signed)
Patient returned call and made aware.

## 2014-06-19 NOTE — Telephone Encounter (Signed)
Call placed to patient. LMTRC.  

## 2014-06-19 NOTE — Telephone Encounter (Signed)
Benadryl should be fine.

## 2014-06-22 ENCOUNTER — Other Ambulatory Visit: Payer: Self-pay | Admitting: Family Medicine

## 2014-06-23 NOTE — Telephone Encounter (Signed)
Refill appropriate and filled per protocol. 

## 2014-06-26 ENCOUNTER — Ambulatory Visit (INDEPENDENT_AMBULATORY_CARE_PROVIDER_SITE_OTHER): Payer: Medicare Other | Admitting: Family Medicine

## 2014-06-26 ENCOUNTER — Encounter: Payer: Self-pay | Admitting: Family Medicine

## 2014-06-26 VITALS — BP 124/68 | HR 68 | Temp 97.8°F | Resp 14 | Ht 66.0 in | Wt 143.0 lb

## 2014-06-26 DIAGNOSIS — I10 Essential (primary) hypertension: Secondary | ICD-10-CM

## 2014-06-26 DIAGNOSIS — I5032 Chronic diastolic (congestive) heart failure: Secondary | ICD-10-CM

## 2014-06-26 DIAGNOSIS — M5136 Other intervertebral disc degeneration, lumbar region: Secondary | ICD-10-CM

## 2014-06-26 DIAGNOSIS — E785 Hyperlipidemia, unspecified: Secondary | ICD-10-CM

## 2014-06-26 DIAGNOSIS — G589 Mononeuropathy, unspecified: Secondary | ICD-10-CM

## 2014-06-26 DIAGNOSIS — E1141 Type 2 diabetes mellitus with diabetic mononeuropathy: Secondary | ICD-10-CM

## 2014-06-26 DIAGNOSIS — M5137 Other intervertebral disc degeneration, lumbosacral region: Secondary | ICD-10-CM

## 2014-06-26 DIAGNOSIS — I509 Heart failure, unspecified: Secondary | ICD-10-CM

## 2014-06-26 DIAGNOSIS — Z23 Encounter for immunization: Secondary | ICD-10-CM

## 2014-06-26 DIAGNOSIS — E1149 Type 2 diabetes mellitus with other diabetic neurological complication: Secondary | ICD-10-CM

## 2014-06-26 MED ORDER — OXYCODONE-ACETAMINOPHEN 7.5-325 MG PO TABS: 1.0000 | ORAL_TABLET | Freq: Two times a day (BID) | ORAL | Status: DC | PRN

## 2014-06-26 MED ORDER — ONDANSETRON HCL 4 MG PO TABS: ORAL_TABLET | ORAL | Status: DC

## 2014-06-26 NOTE — Assessment & Plan Note (Signed)
Blood pressure well controlled

## 2014-06-26 NOTE — Assessment & Plan Note (Signed)
Fairly well controlled on him, and Januvia I will continue for now

## 2014-06-26 NOTE — Progress Notes (Signed)
Patient ID: Mary [name], female   DOB: January 31, 1951, 63 y.o.   MRN: [id number]   Subjective:    Patient ID: Mary [name], female    DOB: Feb 03, 1951, 63 y.o.   MRN: [id number]  Patient presents for Hospital  F/U and Handicap Forms  patient was admitted secondary to chest pain shortness of breath a month or so ago she also had another ER appointment for shortness of breath however workup was benign at that time. She continues to complain of decreased sensation one small spot on her left chest wall about the size of a quarter. She was seen by her cardiologist everything is stable cardiology wise to stop that this may of been due to some nerve damage after she's had her CABG. Diabetes mellitus she states her blood sugars have been 150-220 her last A1c was 7.2% at the end of July.  Congestive heart failure she is awaiting daily and her weight has been unchanged. She's been taking Lasix but wanted to inquire whether she is suppose to take this daily with potassium.   she also requested a letter for her landlord said that she can move in with her sister and sister-in-law so they can help care for her      Review Of Systems:  GEN- denies fatigue, fever, weight loss,weakness, recent illness HEENT- denies eye drainage, change in vision, nasal discharge, CVS- denies chest pain, palpitations RESP- denies SOB, cough, wheeze ABD- denies N/V, change in stools, abd pain GU- denies dysuria, hematuria, dribbling, incontinence MSK- denies joint pain, muscle aches, injury Neuro- denies headache, dizziness, syncope, seizure activity       Objective:    BP 124/68  Pulse 68  Temp(Src) 97.8 F (36.6 C) (Oral)  Resp 14  Ht 5\' 6"  (1.676 m)  Wt 143 lb (64.864 kg)  BMI 23.09 kg/m2 GEN- NAD, alert and oriented x3 HEENT- PERRL, EOMI, non injected sclera, pink conjunctiva, MMM, oropharynx clear Neck- Supple,  CVS- RRR, no murmur RESP-CTAB  normal WOB, good air movement Skin- linear CABG scar- otherwise  normal skin chest wall EXT- No edema Pulses- Radial, DP- 2+       Assessment & Plan:      Problem List Items Addressed This Visit   Hyperlipidemia   Relevant Orders      Lipid panel   HTN (hypertension)   Diastolic congestive heart failure   Diabetes mellitus - Primary   Relevant Orders      Microalbumin / creatinine urine ratio      CBC with Differential      Comprehensive metabolic panel      Hemoglobin A1c   DDD (degenerative disc disease), lumbar   Relevant Medications      oxyCODONE-acetaminophen (PERCOCET) 7.5-325 MG per tablet      Note: This dictation was prepared with Dragon dictation along with smaller phrase technology. Any transcriptional errors that result from this process are unintentional.

## 2014-06-26 NOTE — Assessment & Plan Note (Signed)
She is due for lipid panel goal be an LDL less than 100 with her history of coronary artery disease will obtain labs before next visit

## 2014-06-26 NOTE — Assessment & Plan Note (Signed)
Percocet was refilled to continue on the same dose

## 2014-06-26 NOTE — Patient Instructions (Addendum)
Continue current medications  Letter for landlord Pain medication  Take lasix once a day  Flu shot  F/U 3 months- Get labs in [place] before visit

## 2014-06-26 NOTE — Assessment & Plan Note (Signed)
Her weight is up a few pounds as well she was discharged from the hospital but I do not see any evidence of fluid overload discussed importance of taking Lasix with potassium every day

## 2014-06-27 LAB — MICROALBUMIN / CREATININE URINE RATIO
Creatinine, Urine: 31.5 mg/dL
Microalb Creat Ratio: 15.9 mg/g (ref 0.0–30.0)
Microalb, Ur: 0.5 mg/dL (ref 0.00–1.89)

## 2014-07-06 ENCOUNTER — Other Ambulatory Visit: Payer: Self-pay | Admitting: Family Medicine

## 2014-07-06 NOTE — Telephone Encounter (Signed)
Ok to refill??  Last office visit 06/26/2014.  Last refill 06/11/2014.

## 2014-07-06 NOTE — Telephone Encounter (Signed)
Okay to refill? 

## 2014-07-07 NOTE — Telephone Encounter (Signed)
Medication called to pharmacy. 

## 2014-07-11 ENCOUNTER — Other Ambulatory Visit: Payer: Self-pay | Admitting: Family Medicine

## 2014-07-30 ENCOUNTER — Other Ambulatory Visit: Payer: Self-pay | Admitting: Family Medicine

## 2014-07-30 ENCOUNTER — Telehealth: Payer: Self-pay | Admitting: Family Medicine

## 2014-07-30 NOTE — Telephone Encounter (Signed)
Ok to refill??  Last office visit 06/26/2014.  Last refill 07/04/2014.

## 2014-07-30 NOTE — Telephone Encounter (Signed)
Patient is calling to get rx for her percocet   [phone] when ready to pick up

## 2014-07-31 MED ORDER — OXYCODONE-ACETAMINOPHEN 7.5-325 MG PO TABS: 1.0000 | ORAL_TABLET | Freq: Two times a day (BID) | ORAL | Status: DC | PRN

## 2014-07-31 NOTE — Telephone Encounter (Signed)
Ok to refill??  Last office visit 06/26/2014.  Last refill 07/07/2014.

## 2014-07-31 NOTE — Telephone Encounter (Signed)
Okay, but she can't get until 10/11

## 2014-07-31 NOTE — Telephone Encounter (Signed)
Prescription printed and patient made aware to come to office to pick up.  

## 2014-07-31 NOTE — Telephone Encounter (Signed)
Okay to refill? 

## 2014-07-31 NOTE — Telephone Encounter (Signed)
Medication called to pharmacy. 

## 2014-08-07 ENCOUNTER — Telehealth: Payer: Self-pay | Admitting: Cardiovascular Disease

## 2014-08-07 NOTE — Telephone Encounter (Signed)
Close encounter 

## 2014-08-13 ENCOUNTER — Telehealth: Payer: Self-pay | Admitting: Family Medicine

## 2014-08-13 NOTE — Telephone Encounter (Signed)
Call placed to patient.   Reports that she kicked the refrigerator on Sunday and knocked off her great toe nail.   States that she has been cleaning it with peroxide and neosporin, but she would like to have MD assess.   Also states that she has new areas of psoriasis that she would like MD to assess.   Advised she would need to schedule appointment.   Appointment scheduled for 08/14/2014.

## 2014-08-13 NOTE — Telephone Encounter (Signed)
Patient would like to speak with you regarding her big toe falling off and psoriasis in her hairline as well please call her back at  [phone]

## 2014-08-14 ENCOUNTER — Encounter: Payer: Self-pay | Admitting: Family Medicine

## 2014-08-14 ENCOUNTER — Ambulatory Visit (INDEPENDENT_AMBULATORY_CARE_PROVIDER_SITE_OTHER): Payer: Medicare Other | Admitting: Family Medicine

## 2014-08-14 ENCOUNTER — Other Ambulatory Visit: Payer: Self-pay | Admitting: Family Medicine

## 2014-08-14 ENCOUNTER — Telehealth: Payer: Self-pay | Admitting: Family Medicine

## 2014-08-14 VITALS — BP 130/76 | HR 78 | Temp 98.1°F | Resp 14 | Ht 66.0 in | Wt 155.0 lb

## 2014-08-14 DIAGNOSIS — S91209A Unspecified open wound of unspecified toe(s) with damage to nail, initial encounter: Secondary | ICD-10-CM

## 2014-08-14 DIAGNOSIS — L03031 Cellulitis of right toe: Secondary | ICD-10-CM

## 2014-08-14 DIAGNOSIS — L409 Psoriasis, unspecified: Secondary | ICD-10-CM

## 2014-08-14 MED ORDER — ALBUTEROL SULFATE HFA 108 (90 BASE) MCG/ACT IN AERS: 2.0000 | INHALATION_SPRAY | Freq: Four times a day (QID) | RESPIRATORY_TRACT | Status: DC | PRN

## 2014-08-14 MED ORDER — KETOCONAZOLE 2 % EX SHAM: 1.0000 "application " | MEDICATED_SHAMPOO | CUTANEOUS | Status: DC

## 2014-08-14 MED ORDER — CEPHALEXIN 500 MG PO CAPS: 500.0000 mg | ORAL_CAPSULE | Freq: Two times a day (BID) | ORAL | Status: DC

## 2014-08-14 MED ORDER — CLOBETASOL PROPIONATE 0.05 % EX SOLN: 1.0000 "application " | Freq: Two times a day (BID) | CUTANEOUS | Status: DC

## 2014-08-14 NOTE — Telephone Encounter (Signed)
Refill appropriate and filled per protocol. 

## 2014-08-14 NOTE — Patient Instructions (Signed)
Use medicated shampoo and the steroid in scalp Antibiotics, epson salt  F/u as previous

## 2014-08-14 NOTE — Telephone Encounter (Signed)
Prescription sent to pharmacy.

## 2014-08-14 NOTE — Telephone Encounter (Signed)
[phone]  Pt is needing a refill on proair   [employer]

## 2014-08-14 NOTE — Progress Notes (Signed)
Patient ID: Mary [name], female   DOB: 07-Dec-1950, 63 y.o.   MRN: [id number]   Subjective:    Patient ID: Mary [name], female    DOB: [birth year], 63 y.o.   MRN: [id number]  Patient presents for R Great Toe Nail Knocked Off and Psoriasis  patient here with concern for psoriasis of her posterior scalp she's had severe itching in the scalp as well as flaking and redness for the past 2 weeks she also notices in her front hairline she's tried some over-the-counter head and shoulders with minimal relief she states that the itching is intolerable. She did have a mild dry rash on her elbows but that improved which is moisturizing  Approximately one week ago she stubbed her toe on her refrigerator and her nail came off completely since then she's had some redness mild yellow drainage that she is concerned about. She has been using peroxide and neosporin     Review Of Systems:  GEN- denies fatigue, fever, weight loss,weakness, recent illness HEENT- denies eye drainage, change in vision, nasal discharge, CVS- denies chest pain, palpitations RESP- denies SOB, cough, wheeze MSK- denies joint pain, muscle aches, +injury Neuro- denies headache, dizziness, syncope, seizure activity       Objective:    BP 130/76  Pulse 78  Temp(Src) 98.1 F (36.7 C) (Oral)  Resp 14  Ht 5\' 6"  (1.676 m)  Wt 155 lb (70.308 kg)  BMI 25.03 kg/m2 GEN- NAD, alert and oriented x3 Skin-post hairline, erythema with slivery scaly flakes, no discrete plaques, mild erythema and dandruff front hairline, no lesions on elbows Ext- Right great toenail- nailbed exposed with erythema no bleeding noted, small amount of yellow discharge, erythema and swelling of lateral nailfold        Assessment & Plan:      Problem List Items Addressed This Visit   None    Visit Diagnoses   Psoriasis of scalp    -  Primary    If he is sitting psoriasis that she may have some underlying seborrheic dermatitis as well I will have her use  Nizoral shampoo give her clobetasol solution    Infected nailbed of toe, right        Epson salt, keflex    Relevant Medications       cephALEXin (KEFLEX) capsule       ketoconazole (NIZORAL) 2 % shampoo    Toenail avulsion, initial encounter           Note: This dictation was prepared with Dragon dictation along with smaller phrase technology. Any transcriptional errors that result from this process are unintentional.

## 2014-08-24 ENCOUNTER — Other Ambulatory Visit: Payer: Self-pay | Admitting: Family Medicine

## 2014-08-24 NOTE — Telephone Encounter (Signed)
?  ok to refill,  Last refill 07/17/14  Last ov 08/14/14

## 2014-08-24 NOTE — Telephone Encounter (Signed)
Okay to refill? 

## 2014-08-24 NOTE — Telephone Encounter (Signed)
Medication refilled per protocol. 

## 2014-08-27 ENCOUNTER — Other Ambulatory Visit: Payer: Self-pay | Admitting: Family Medicine

## 2014-08-27 NOTE — Telephone Encounter (Signed)
Refill appropriate and filled per protocol. 

## 2014-08-31 ENCOUNTER — Telehealth: Payer: Self-pay | Admitting: Family Medicine

## 2014-08-31 MED ORDER — OXYCODONE-ACETAMINOPHEN 7.5-325 MG PO TABS: 1.0000 | ORAL_TABLET | Freq: Two times a day (BID) | ORAL | Status: DC | PRN

## 2014-08-31 NOTE — Telephone Encounter (Signed)
Ok to refill??  Last office visit 08/10/2014.  Last refill 07/31/2014.

## 2014-08-31 NOTE — Telephone Encounter (Signed)
Okay to refill? 

## 2014-08-31 NOTE — Telephone Encounter (Signed)
Prescription printed and patient made aware to come to office to pick up per VM.

## 2014-08-31 NOTE — Telephone Encounter (Signed)
Patient is calling to get refill on her percocet to pick up on Tuesday if possible  [phone] (H)

## 2014-09-01 ENCOUNTER — Other Ambulatory Visit: Payer: Self-pay | Admitting: Family Medicine

## 2014-09-03 NOTE — Telephone Encounter (Signed)
Okay to refill give 2 

## 2014-09-03 NOTE — Telephone Encounter (Signed)
Medication called to pharmacy. 

## 2014-09-03 NOTE — Telephone Encounter (Signed)
Ok to refill??  Last office visit 08/14/2014.  Last refill 08/05/2014.

## 2014-09-04 ENCOUNTER — Ambulatory Visit (INDEPENDENT_AMBULATORY_CARE_PROVIDER_SITE_OTHER): Payer: Medicare Other | Admitting: Cardiovascular Disease

## 2014-09-04 ENCOUNTER — Encounter: Payer: Self-pay | Admitting: Cardiovascular Disease

## 2014-09-04 VITALS — BP 124/72 | HR 120 | Ht 67.0 in | Wt 152.3 lb

## 2014-09-04 DIAGNOSIS — Z79899 Other long term (current) drug therapy: Secondary | ICD-10-CM

## 2014-09-04 DIAGNOSIS — I2510 Atherosclerotic heart disease of native coronary artery without angina pectoris: Secondary | ICD-10-CM

## 2014-09-04 DIAGNOSIS — E1141 Type 2 diabetes mellitus with diabetic mononeuropathy: Secondary | ICD-10-CM

## 2014-09-04 DIAGNOSIS — I10 Essential (primary) hypertension: Secondary | ICD-10-CM

## 2014-09-04 DIAGNOSIS — I4510 Unspecified right bundle-branch block: Secondary | ICD-10-CM

## 2014-09-04 DIAGNOSIS — I4891 Unspecified atrial fibrillation: Secondary | ICD-10-CM

## 2014-09-04 MED ORDER — FUROSEMIDE 40 MG PO TABS: 20.0000 mg | ORAL_TABLET | Freq: Every day | ORAL | Status: DC

## 2014-09-04 MED ORDER — METOPROLOL TARTRATE 50 MG PO TABS: 50.0000 mg | ORAL_TABLET | Freq: Two times a day (BID) | ORAL | Status: DC

## 2014-09-04 MED ORDER — DIGOXIN 125 MCG PO TABS: 0.1250 mg | ORAL_TABLET | Freq: Every day | ORAL | Status: DC

## 2014-09-04 NOTE — Progress Notes (Signed)
Patient ID: Mary [name], female   DOB: 04/20/1951, 63 y.o.   MRN: [id number]       HPI: Mary [name] is a 63 y.o. female who presents to the office for followup cardiology evaluation.  Chief complaint is that of feeling nervous and heart rate irregularity.  Mary [name] had a previous history of permanent atrial fibrillation,hypertension, DM2, COPD and long-standing prior history of tobacco use. In February 2014 she was hospitalized and felt to have diastolic congestive heart failure. She has been on low-dose ACE inhibitor. An echo Doppler in November 2013 showed an ejection fraction of 55-60% with grade 1 diastolic dysfunction, moderate mitral regurgitation, moderate tricuspid regurgitation, biatrial enlargement.  On 01/07/2014  she underwent cardiac catheterization by Dr. Gwenlyn Found after she  presented to the hospital with left-sided chest pain, associated with diaphoresis, weakness, nausea and vomiting.  Cardiac catheterization demonstrated 80% tapered calcified left main stenosis with 3 vessel CAD involving a subtotal proximal to mid LAD stenosis with TIMI 2 flow, an occluded circumflex vessel.  After the first large bifurcating obtuse marginal branch as well as 70% mid AV groove stenosis and a small-caliber occluded mid RCA.  She underwent CABG revascularization surgery on 01/09/2014 by Dr. Tharon Aquas Tright and had a LIMA placed to the LAD, a saphenous vein graft to the ramus intermediate, saphenous vein graft to the circumflex marginal vessel.  She also had application of a left atrial clip.  Preoperative ejection fraction was 30%.  She has participated in cardiac rehabilitation.  Is complaining of occasional nausea and mild shortness of breath.  She also notes fatigue.  She presents now for evaluation.  Has also a history of COPD.  In April 2015 she was on a medical regimen, consisting of Lasix 40 mg, lisinopril 2.5 mg twice a day, digoxin, 0.125 mg, metoprolol tartrate 12.5 twice a day.  An echo Doppler  study on 04/17/2014 showed improvement in her Ejection fraction at 55-60%, although she had a pseudo-normal left ventricular filling pattern consistent with grade 2 diastolic dysfunction.  Mild hypokinesis of the mid, anteroseptal, basal, inferoseptal and apical septal wall.  Mitral annular calcification, mild MR, moderate LA, and mild RA dilatation with mild TR. She had been in sinus rhythm and when last seen by me July was maintaining sinus rhythm.  At that time, I discontinued her Lanoxin and further titrated her metoprolol tartrate from 12.5 twice a day to 25 mg twice a day.  Recently, she has noticed heart rate irregularity but does not know for how long.  She feels nervous.  She quit smoking following her bypass surgery.  She states her blood sugar has not been well controlled.  She sees Dr. Buelah Manis for her care who is been trying to adjust her anxiety as well as diabetes.  She presents for follow-up cardiology evaluation.    Past Medical History  Diagnosis Date  . Diabetes mellitus   . Arthritis   . Anxiety   . Chronic pain     Bacl pain, Disc L5-S1- Dr. Joya Salm in [place]  . Hyperlipidemia   . Hypertension   . Tachycardia   . Bronchial asthma   . Atrial fibrillation   . COPD (chronic obstructive pulmonary disease)   . DDD (degenerative disc disease)     Radicular symptoms  . Shortness of breath   . CHF (congestive heart failure)   . Hx of echocardiogram     Was interpreted by Dr Doylene Canard that showed an Ef in the 50-60% range  with grade 1 diastolic dysfunction. she had moderate MR, biatrial enlargement, moderate TR and at that time estimated RV systolic pressure was 43 mm.  . History of stress test 06/2011    Abnormal myocardial perfusion study.    Past Surgical History  Procedure Laterality Date  . Carpal tunnel release      x 2  . Breast cyst removed    . Abdominal hysterectomy      partial  . Tubal ligation    . Tonsillectomy    . Coronary artery bypass graft N/A 01/09/2014     Procedure: CORONARY ARTERY BYPASS GRAFTING (CABG) x 3 using endoscopically harvested right saphenous vein and left internal mammary artery and closure of left atrial appendage;  Surgeon: Ivin Poot, MD;  Location: [facility];  Service: Open Heart Surgery;  Laterality: N/A;  patient has preop IA BP   . Intraoperative transesophageal echocardiogram N/A 01/09/2014    Procedure: INTRAOPERATIVE TRANSESOPHAGEAL ECHOCARDIOGRAM;  Surgeon: Ivin Poot, MD;  Location: [facility];  Service: Open Heart Surgery;  Laterality: N/A;    Allergies  Allergen Reactions  . Adhesive [Tape] Other (See Comments)    Tears skin off.   . Latex Other (See Comments)    In tape, tears skin off.  . Zocor [Simvastatin - High Dose] Nausea And Vomiting    Current Outpatient Prescriptions  Medication Sig Dispense Refill  . ACCU-CHEK AVIVA PLUS test strip     . albuterol (PROAIR HFA) 108 (90 BASE) MCG/ACT inhaler Inhale 2 puffs into the lungs every 6 (six) hours as needed for wheezing or shortness of breath. 18 g 11  . ALPRAZolam (XANAX) 1 MG tablet TAKE ONE TABLET BY MOUTH 2 TIMES A DAY AS NEEDED. 60 tablet 2  . aspirin EC 81 MG tablet Take 81 mg by mouth daily.    Marland Kitchen atorvastatin (LIPITOR) 20 MG tablet TAKE ONE TABLET BY MOUTH DAILY. 30 tablet 3  . budesonide-formoterol (SYMBICORT) 160-4.5 MCG/ACT inhaler Inhale 2 puffs into the lungs 2 (two) times daily. 1 Inhaler 12  . Canagliflozin (INVOKANA) 100 MG TABS Take 1 tablet (100 mg total) by mouth daily. 30 tablet 6  . clobetasol (TEMOVATE) 0.05 % external solution Apply 1 application topically 2 (two) times daily. 50 mL 1  . ferrous [id number] C-folic acid ([name] / FOLTRIN) capsule Take 1 capsule by mouth daily. 30 capsule 6  . FLUoxetine (PROZAC) 40 MG capsule TAKE ONE CAPSULE BY MOUTH DAILY. 30 capsule 2  . furosemide (LASIX) 40 MG tablet Take 1 tablet (40 mg total) by mouth daily. Take daily. 30 tablet 2  . JANUVIA 100 MG tablet TAKE ONE TABLET BY MOUTH DAILY.  30 tablet 6  . ketoconazole (NIZORAL) 2 % shampoo Apply 1 application topically 2 (two) times a week. 120 mL 0  . lisinopril (PRINIVIL,ZESTRIL) 2.5 MG tablet Take 1 tablet (2.5 mg total) by mouth daily. 30 tablet 5  . metoprolol tartrate (LOPRESSOR) 25 MG tablet TAKE 1 TABLET BY MOUTH THREE TIMES DAILY. 90 tablet 1  . ondansetron (ZOFRAN) 4 MG tablet take 1 tablet by mouth every 6 hours if needed for nausea 20 tablet 1  . oxyCODONE-acetaminophen (PERCOCET) 7.5-325 MG per tablet Take 1 tablet by mouth 2 (two) times daily as needed for pain. 60 tablet 0  . potassium chloride SA (K-DUR,KLOR-CON) 20 MEQ tablet TAKE 1 TABLET BY MOUTH ONCE DAILY. TAKE WITH FUROSEMIDE. 30 tablet 0  . sodium chloride (OCEAN) 0.65 % nasal spray Place 1 spray into the  nose as needed for congestion. Congestion    . thiamine 100 MG tablet Take 1 tablet (100 mg total) by mouth daily. 30 tablet 0   No current facility-administered medications for this visit.    History   Social History  . Marital Status: Divorced    Spouse Name: N/A    Number of Children: N/A  . Years of Education: N/A   Occupational History  . Not on file.   Social History Main Topics  . Smoking status: Former Smoker    Types: Cigarettes    Quit date: 05/26/2013  . Smokeless tobacco: Never Used     Comment: quit 6 months ago  . Alcohol Use: No  . Drug Use: No  . Sexual Activity: Yes    Birth Control/ Protection: Surgical   Other Topics Concern  . Not on file   Social History Narrative    Socially she is divorce. She has one deceased child and 2 grandchildren. She quit tobacco at the end of August 2014.  She now has one great grandchild. There is no alcohol use. She does not routinely exercise.  Family History  Problem Relation Age of Onset  . Diabetes Mother   . Heart disease Mother   . Diabetes Sister   . Hypertension Sister   . Hyperlipidemia Sister   . Diabetes Brother   . Heart disease Brother   . Diabetes Brother   . Heart  disease Brother    ROS General: Negative; No fevers, chills, or night sweats; positive for fatigue HEENT: Negative; No changes in vision or hearing, sinus congestion, difficulty swallowing Pulmonary: Positive for mild shortness of breath; No cough, wheezing, , hemoptysis Cardiovascular: Negative; No chest pain, presyncope, syncope, palpatations GI: Positive for occasional nausea, no vomiting, diarrhea, or abdominal pain GU: Negative; No dysuria, hematuria, or difficulty voiding Musculoskeletal: Negative; no myalgias, joint pain, or weakness Hematologic/Oncology: Negative; no easy bruising, bleeding Endocrine: Negative; no heat/cold intolerance; positive for diabetes Neuro: Negative; no changes in balance, headaches Skin: Negative; No rashes or skin lesions Psychiatric: positive for anxiety depression Sleep: Negative; No snoring, daytime sleepiness, hypersomnolence, bruxism, restless legs, hypnogognic hallucinations, no cataplexy Other comprehensive 14 point system review is negative   PE BP 124/72 mmHg  Pulse 120  Ht 5\' 7"  (1.702 m)  Wt 152 lb 4.8 oz (69.083 kg)  BMI 23.85 kg/m2  General: Alert, oriented, no distress.  Skin: normal turgor, no rashes HEENT: Normocephalic, atraumatic. Pupils round and reactive; sclera anicteric;no lid lag.  Nose without nasal septal hypertrophy Mouth/Parynx benign; Mallinpatti scale 2/3 Neck: No JVD, no carotid bruits Chest wall: Mild tenderness over the sternotomy site Lungs: Decreased breath sound segmenter long-standing tobacco use. no wheezing or rales Heart: PMI is not displaced.  Rhythm is irregularly irregular with an increased rate of approximately 100-120 bpm. s1 s2 normal 1/6 systolic murmur; no diastolic murmur Abdomen: soft, nontender; no hepatosplenomehaly, BS+; abdominal aorta nontender and not dilated by palpation. Pulses 2+ Extremities:  leg edema no clubbing cyanosis, Homan's sign negative  Neurologic: grossly  nonfocal Psychologic: Normal affect and mood  ECG (independently read by me): atrial fibrillation with ventricular response at 1 20 bpm with right bundle branch block, repolarization changes.  04/25/2014 ECG (independently read by me): Normal sinus rhythm at 63 beats per minute.  Recommend followup with repolarization changes.  Prior April 2015 ECG: Normal sinus rhythm with right bundle branch block and repolarization changes.  Prior 10/04/2013 ECG: Atrial fibrillation at a rate At approximately 100-120 beats  per minute. Nonspecific ST changes.  LABS:  BMET    Component Value Date/Time   NA 133* 05/29/2014 1330   K 4.1 05/29/2014 1330   CL 90* 05/29/2014 1330   CO2 24 05/29/2014 1330   GLUCOSE 204* 05/29/2014 1330   BUN 26* 05/29/2014 1330   CREATININE 0.89 05/29/2014 1330   CREATININE 0.71 02/27/2014 1007   CALCIUM 10.6* 05/29/2014 1330   GFRNONAA 67* 05/29/2014 1330   GFRNONAA 74 09/29/2013 1500   GFRAA 78* 05/29/2014 1330   GFRAA 86 09/29/2013 1500     Hepatic Function Panel     Component Value Date/Time   PROT 7.4 05/15/2014 0229   ALBUMIN 4.0 05/15/2014 0229   AST 13 05/15/2014 0229   ALT 8 05/15/2014 0229   ALKPHOS 63 05/15/2014 0229   BILITOT 0.7 05/15/2014 0229   BILIDIR 0.1 09/29/2013 1459   IBILI 0.3 09/29/2013 1459     CBC    Component Value Date/Time   WBC 13.5* 05/29/2014 1330   RBC 4.80 05/29/2014 1330   RBC 3.69* 10/26/2013 0622   HGB 14.3 05/29/2014 1330   HCT 42.7 05/29/2014 1330   PLT 216 05/29/2014 1330   MCV 89.0 05/29/2014 1330   MCH 29.8 05/29/2014 1330   MCHC 33.5 05/29/2014 1330   RDW 15.4 05/29/2014 1330   LYMPHSABS 1.0 01/06/2014 1955   MONOABS 1.0 01/06/2014 1955   EOSABS 0.0 01/06/2014 1955   BASOSABS 0.0 01/06/2014 1955     BNP    Component Value Date/Time   PROBNP 1077.0* 05/29/2014 1330    Lipid Panel     Component Value Date/Time   CHOL 153 02/27/2014 1007   TRIG 178* 02/27/2014 1007   HDL 39* 02/27/2014 1007    CHOLHDL 3.9 02/27/2014 1007   VLDL 36 02/27/2014 1007   LDLCALC 78 02/27/2014 1007     RADIOLOGY: No results found.    ASSESSMENT AND PLAN:  Mary [name] is a 63 year old female who had a prior history of permanent atrial fibrillation and previously had been on Pradaxa therapy.  She also has a history of significant prior tobacco use, but fortunately she has quit smoking.  On 01/09/2014 she underwent CABG surgery for severe multivessel CAD, including left main stenosis.  At that time, she did have a left atrial clip.  Preoperatively, her ejection fraction was 30%.  Postoperatively, her ejection fraction had improved to 55-60%.  Following her surgery, she was not restarted on Pradaxa since she was maintaining sinus rhythm.  Apparently since I last saw her in July she has reverted back to atrial fibrillation.  I have recommended resumption of Pradaxa at her previous dose.  I am also changing her metoprolol tartrate from 25 mg 3 times daily to 50 mg twice a day and resuming low-dose Lanoxin 0.125 mg for additional rate control.  I am decreasing her Lasix from 40 mg to 20 mg.  Follow-up laboratory will be done by her primary physician in several weeks.  I will see her in 4 weeks for cardiology reevaluation.   Troy Sine, MD, [facility]  09/04/2014 1:55 PM

## 2014-09-04 NOTE — Patient Instructions (Addendum)
Your physician has recommended you make the following change in your medication: the lopressor has been changed to 50 mg twice a day. Restart the digoxin 0.125mg . Decrease the furosemide to 20 mg daily.  (1/2 tablet daily) The new prescriptions for the lopressor and lanoxin has been sent to the pharmacy. Restart the Pradaxa 150 mg prescription 1 tablet twice a day.   Your physician recommends that you schedule a follow-up appointment in: 3-4 weeks with Dr. Claiborne Billings.

## 2014-09-14 ENCOUNTER — Telehealth: Payer: Self-pay | Admitting: Cardiovascular Disease

## 2014-09-14 MED ORDER — DABIGATRAN ETEXILATE MESYLATE 150 MG PO CAPS: 150.0000 mg | ORAL_CAPSULE | Freq: Two times a day (BID) | ORAL | Status: DC

## 2014-09-14 NOTE — Telephone Encounter (Signed)
Pt need a new prescription for her Pradaxa. Please call this to [phone].Please call this in asap please,she wants to be sure it will be delivered today.

## 2014-09-14 NOTE — Telephone Encounter (Signed)
Rx refill sent to patient pharmacy   

## 2014-09-19 ENCOUNTER — Telehealth: Payer: Self-pay | Admitting: *Deleted

## 2014-09-19 NOTE — Telephone Encounter (Signed)
The rash needs to be looked at, there is a difference between psoriasis and allergic reaction, she can go to urgent care in [place] or ER

## 2014-09-19 NOTE — Telephone Encounter (Signed)
Pt called stating she was here sometime in October for psiasoris and states now has gotten worse, rash is all over her back,shoulders, and waist, states can not wear clothes d/t irritation, says she has not changed her soaps or detergents, did want you to know that she received a shingles shot from [place] couple weeks ago and not sure if the rash is coming from that. Wants to know if you could call in something for her.

## 2014-09-19 NOTE — Telephone Encounter (Signed)
Pt aware of message

## 2014-09-29 LAB — CBC WITH DIFFERENTIAL/PLATELET
BASOS ABS: 0.1 10*3/uL (ref 0.0–0.1)
BASOS PCT: 1 % (ref 0–1)
Eosinophils Absolute: 0.3 10*3/uL (ref 0.0–0.7)
Eosinophils Relative: 3 % (ref 0–5)
HEMATOCRIT: 40.5 % (ref 36.0–46.0)
HEMOGLOBIN: 12.8 g/dL (ref 12.0–15.0)
LYMPHS PCT: 15 % (ref 12–46)
Lymphs Abs: 1.3 10*3/uL (ref 0.7–4.0)
MCH: 29.8 pg (ref 26.0–34.0)
MCHC: 31.6 g/dL (ref 30.0–36.0)
MCV: 94.4 fL (ref 78.0–100.0)
MONO ABS: 0.6 10*3/uL (ref 0.1–1.0)
MONOS PCT: 7 % (ref 3–12)
NEUTROS ABS: 6.4 10*3/uL (ref 1.7–7.7)
Neutrophils Relative %: 74 % (ref 43–77)
Platelets: 221 10*3/uL (ref 150–400)
RBC: 4.29 MIL/uL (ref 3.87–5.11)
RDW: 14.3 % (ref 11.5–15.5)
WBC: 8.7 10*3/uL (ref 4.0–10.5)

## 2014-09-29 LAB — COMPREHENSIVE METABOLIC PANEL
ALBUMIN: 4.4 g/dL (ref 3.5–5.2)
ALK PHOS: 65 U/L (ref 39–117)
ALT: 11 U/L (ref 0–35)
AST: 14 U/L (ref 0–37)
BUN: 17 mg/dL (ref 6–23)
CALCIUM: 10.1 mg/dL (ref 8.4–10.5)
CO2: 29 mEq/L (ref 19–32)
Chloride: 98 mEq/L (ref 96–112)
Creat: 0.79 mg/dL (ref 0.50–1.10)
Glucose, Bld: 284 mg/dL — ABNORMAL HIGH (ref 70–99)
POTASSIUM: 4.4 meq/L (ref 3.5–5.3)
SODIUM: 136 meq/L (ref 135–145)
TOTAL PROTEIN: 7.6 g/dL (ref 6.0–8.3)
Total Bilirubin: 0.6 mg/dL (ref 0.2–1.2)

## 2014-09-29 LAB — LIPID PANEL
CHOL/HDL RATIO: 3.4 ratio
Cholesterol: 186 mg/dL (ref 0–200)
HDL: 55 mg/dL (ref >39)
LDL CALC: 102 mg/dL — AB (ref 0–99)
Triglycerides: 144 mg/dL (ref <150)
VLDL: 29 mg/dL (ref 0–40)

## 2014-09-29 LAB — HEMOGLOBIN A1C
Hgb A1c MFr Bld: 8.4 % — ABNORMAL HIGH (ref <5.7)
Mean Plasma Glucose: 194 mg/dL — ABNORMAL HIGH (ref <117)

## 2014-10-02 ENCOUNTER — Encounter: Payer: Self-pay | Admitting: Family Medicine

## 2014-10-02 ENCOUNTER — Ambulatory Visit (INDEPENDENT_AMBULATORY_CARE_PROVIDER_SITE_OTHER): Payer: Medicare Other | Admitting: Family Medicine

## 2014-10-02 VITALS — BP 132/70 | HR 76 | Temp 98.3°F | Resp 18 | Ht 67.0 in | Wt 155.0 lb

## 2014-10-02 DIAGNOSIS — E1141 Type 2 diabetes mellitus with diabetic mononeuropathy: Secondary | ICD-10-CM

## 2014-10-02 DIAGNOSIS — I480 Paroxysmal atrial fibrillation: Secondary | ICD-10-CM

## 2014-10-02 DIAGNOSIS — F32A Depression, unspecified: Secondary | ICD-10-CM

## 2014-10-02 DIAGNOSIS — J410 Simple chronic bronchitis: Secondary | ICD-10-CM

## 2014-10-02 DIAGNOSIS — F329 Major depressive disorder, single episode, unspecified: Secondary | ICD-10-CM

## 2014-10-02 MED ORDER — FLUOXETINE HCL 40 MG PO CAPS: ORAL_CAPSULE | ORAL | Status: DC

## 2014-10-02 MED ORDER — OXYCODONE-ACETAMINOPHEN 7.5-325 MG PO TABS: 1.0000 | ORAL_TABLET | Freq: Two times a day (BID) | ORAL | Status: DC | PRN

## 2014-10-02 MED ORDER — FLUOXETINE HCL 20 MG PO TABS: 20.0000 mg | ORAL_TABLET | Freq: Every day | ORAL | Status: DC

## 2014-10-02 NOTE — Assessment & Plan Note (Signed)
A1c is uncontrolled I will increase her  Invokana to 300 mg she will check her blood sugars at home she will also continue the Januvia we will see how she is doing a couple weeks to see if her blood sugars are improving if not I think the best that is to put her on low dose insulin injections to control her blood sugar.

## 2014-10-02 NOTE — Patient Instructions (Addendum)
Continue the current dose of medications Invokana increased to 300mg  once a day  Increase prozac to 60mg   We will f/u blood sugar by phone in 3 weeks F/U 3 months

## 2014-10-02 NOTE — Assessment & Plan Note (Signed)
Increase Prozac to 60 mg either advisor multiple times I'm not going to increase her been so any further I'm also not one increase her pain medication secondary to the history with higher doses of both medications she is often declined going to a pain clinic after I tell her this.

## 2014-10-02 NOTE — Assessment & Plan Note (Signed)
Currently stable and no change in medications immunizations up-to-date

## 2014-10-02 NOTE — Progress Notes (Signed)
Patient ID: Mary [name], female   DOB: 1951/10/13, 63 y.o.   MRN: [id number]   Subjective:    Patient ID: Mary [name], female    DOB: [birth year], 63 y.o.   MRN: [id number]  Patient presents for 3 month F/U and Itching  patient here to follow-up chronic medical problems. She's been seen by her cardiologist this week. She's been very fatigued Mary [name] continues to have pain near her scar from her previous heart surgery which of note she has complained about for months now. She is taking her medications as prescribed however her blood sugars have been very elevated her fasting labs were reviewed her A1c is up to 8.4% of blood sugars have been mostly in the 200s she did have up to 400 on Sunday. She states she has not changed her diet at all. She's taking Invokan 100 mg and Januvia 100 mg secondary to her cardiac history as well as intolerance of sulfonylureas.  She states that she is always a little stressed and that her nerves are bad and she will once again increased her Xanax back to 3 times a day of note she is asked this on multiple occasions and I declined this because of her being seen at the ER multiple times for altered mental status and concern for overuse of her pain medicine and her been sewed together. She is taking her Prozac but states that she does not feel any effect after taking it.   Review Of Systems:  GEN- denies fatigue, fever, weight loss,weakness, recent illness HEENT- denies eye drainage, change in vision, nasal discharge, CVS- denies chest pain, palpitations RESP- denies SOB, cough, wheeze ABD- denies N/V, change in stools, abd pain GU- denies dysuria, hematuria, dribbling, incontinence MSK- + joint pain, muscle aches, injury Neuro- denies headache, dizziness, syncope, seizure activity       Objective:    BP 132/70 mmHg  Pulse 76  Temp(Src) 98.3 F (36.8 C) (Oral)  Resp 18  Ht 5\' 7"  (1.702 m)  Wt 155 lb (70.308 kg)  BMI 24.27 kg/m2 GEN- NAD, alert and oriented  x3 HEENT- PERRL, EOMI, non injected sclera, pink conjunctiva, MMM, oropharynx clear Neck- Supple,  CVS- irregular rhythem, normal rate, no murmur RESP-CTAB   Skin- linear CABG scar- otherwise normal skin chest wall, mild TTP over scar Psych- normal affect and mood EXT- No edema Pulses- Radial, DP- 2+        Assessment & Plan:      Problem List Items Addressed This Visit    None      Note: This dictation was prepared with Dragon dictation along with smaller phrase technology. Any transcriptional errors that result from this process are unintentional.

## 2014-10-02 NOTE — Assessment & Plan Note (Signed)
Currently in A rib, rate controlled, this could be contributing to fatigue,  I tried to give her some reassurance regarding this sensitivity around her scar

## 2014-10-03 ENCOUNTER — Telehealth: Payer: Self-pay | Admitting: *Deleted

## 2014-10-03 NOTE — Telephone Encounter (Signed)
Pt called back and states that she has not had a Mammogram d/t chest is sensitive because of triple bypass in January 09 2014.

## 2014-10-03 NOTE — Telephone Encounter (Signed)
LMTRC to pt, need to know when last Mammogram done for insurance information

## 2014-10-04 ENCOUNTER — Encounter (HOSPITAL_COMMUNITY): Payer: Self-pay | Admitting: Cardiovascular Disease

## 2014-10-04 ENCOUNTER — Ambulatory Visit (INDEPENDENT_AMBULATORY_CARE_PROVIDER_SITE_OTHER): Payer: Medicare Other | Admitting: Cardiovascular Disease

## 2014-10-04 VITALS — BP 102/62 | HR 102 | Ht 67.0 in | Wt 153.0 lb

## 2014-10-04 DIAGNOSIS — Z951 Presence of aortocoronary bypass graft: Secondary | ICD-10-CM

## 2014-10-04 DIAGNOSIS — E785 Hyperlipidemia, unspecified: Secondary | ICD-10-CM

## 2014-10-04 DIAGNOSIS — I4510 Unspecified right bundle-branch block: Secondary | ICD-10-CM

## 2014-10-04 DIAGNOSIS — I2510 Atherosclerotic heart disease of native coronary artery without angina pectoris: Secondary | ICD-10-CM

## 2014-10-04 DIAGNOSIS — I2583 Coronary atherosclerosis due to lipid rich plaque: Secondary | ICD-10-CM

## 2014-10-04 DIAGNOSIS — Z79899 Other long term (current) drug therapy: Secondary | ICD-10-CM

## 2014-10-04 DIAGNOSIS — I4891 Unspecified atrial fibrillation: Secondary | ICD-10-CM

## 2014-10-04 DIAGNOSIS — F419 Anxiety disorder, unspecified: Secondary | ICD-10-CM

## 2014-10-04 DIAGNOSIS — J449 Chronic obstructive pulmonary disease, unspecified: Secondary | ICD-10-CM

## 2014-10-04 MED ORDER — METOPROLOL TARTRATE 50 MG PO TABS: 75.0000 mg | ORAL_TABLET | Freq: Two times a day (BID) | ORAL | Status: DC

## 2014-10-04 MED ORDER — ATORVASTATIN CALCIUM 40 MG PO TABS: 40.0000 mg | ORAL_TABLET | Freq: Every day | ORAL | Status: DC

## 2014-10-04 NOTE — Progress Notes (Signed)
Patient ID: Mary [name], female   DOB: 07-27-1951, 63 y.o.   MRN: [id number]       HPI: Mary [name] is a 63 y.o. female who presents to the office for followup cardiology evaluation.   Ms. Glazer had a previous history of permanent atrial fibrillation,hypertension, DM2, COPD and long-standing prior history of tobacco use. In February 2014 she was hospitalized and felt to have diastolic congestive heart failure. She has been on low-dose ACE inhibitor. An echo Doppler in November 2013 showed an ejection fraction of 55-60% with grade 1 diastolic dysfunction, moderate mitral regurgitation, moderate tricuspid regurgitation, biatrial enlargement.   On 01/07/2014  she underwent cardiac catheterization by Dr. Gwenlyn Found after she  presented to the hospital with left-sided chest pain, associated with diaphoresis, weakness, nausea and vomiting.  Cardiac catheterization demonstrated 80% tapered calcified left main stenosis with 3 vessel CAD involving a subtotal proximal to mid LAD stenosis with TIMI 2 flow, an occluded circumflex vessel.  After the first large bifurcating obtuse marginal branch as well as 70% mid AV groove stenosis and a small-caliber occluded mid RCA.  She underwent CABG revascularization surgery on 01/09/2014 by Dr. Tharon Aquas Tright and had a LIMA placed to the LAD, a saphenous vein graft to the ramus intermediate, saphenous vein graft to the circumflex marginal vessel.  She also had application of a left atrial clip.  Preoperative ejection fraction was 30%.  In April 2015 she was on a medical regimen, consisting of Lasix 40 mg, lisinopril 2.5 mg twice a day, digoxin, 0.125 mg, metoprolol tartrate 12.5 twice a day.  An echo Doppler study on 04/17/2014 showed improvement in her Ejection fraction at 55-60%, although she had a pseudo-normal left ventricular filling pattern consistent with grade 2 diastolic dysfunction.  Mild hypokinesis of the mid, anteroseptal, basal, inferoseptal and apical septal wall.   Mitral annular calcification, mild MR, moderate LA, and mild RA dilatation with mild TR. She had been in sinus rhythm and when last seen by me July was maintaining sinus rhythm.  At that time, I discontinued her Lanoxin and further titrated her metoprolol tartrate from 12.5 twice a day to 25 mg twice a day.  Postoperatively, she had converted initially to sinus rhythm and was in normal sinus rhythm as of her July 2015 evaluation.  I last saw her one month ago at which time she had noticed heart rate irregularity but does not know for how long.  She has felt nervous.  Has noted mild sensation of being nauseated.  She did undergo laboratory on December 4, which revealed a glucose of 284, hemoglobin A1c 8.4.  She had normal liver function studies.  Renal function was normal with a creatinine of 0.79.  Lipid studies revealed a total cholesterol 186, triglycerides 144, HDL 55, and LDL cholesterol 102.  She has been on an increased dose of Invokana at 300 mg in addition to Januvia 100 mg per remotely, she had been on metformin but apparently her primary care physician discontinued this.  She has only been taking atorvastatin 20 mg.  She presents for evaluation.  Past Medical History  Diagnosis Date  . Diabetes mellitus   . Arthritis   . Anxiety   . Chronic pain     Bacl pain, Disc L5-S1- Dr. Joya Salm in [place]  . Hyperlipidemia   . Hypertension   . Tachycardia   . Bronchial asthma   . Atrial fibrillation   . COPD (chronic obstructive pulmonary disease)   . DDD (degenerative  disc disease)     Radicular symptoms  . Shortness of breath   . CHF (congestive heart failure)   . Hx of echocardiogram     Was interpreted by Dr Doylene Canard that showed an Ef in the 50-60% range with grade 1 diastolic dysfunction. she had moderate MR, biatrial enlargement, moderate TR and at that time estimated RV systolic pressure was 43 mm.  . History of stress test 06/2011    Abnormal myocardial perfusion study.    Past Surgical  History  Procedure Laterality Date  . Carpal tunnel release      x 2  . Breast cyst removed    . Abdominal hysterectomy      partial  . Tubal ligation    . Tonsillectomy    . Coronary artery bypass graft N/A 01/09/2014    Procedure: CORONARY ARTERY BYPASS GRAFTING (CABG) x 3 using endoscopically harvested right saphenous vein and left internal mammary artery and closure of left atrial appendage;  Surgeon: Ivin Poot, MD;  Location: [facility];  Service: Open Heart Surgery;  Laterality: N/A;  patient has preop IA BP   . Intraoperative transesophageal echocardiogram N/A 01/09/2014    Procedure: INTRAOPERATIVE TRANSESOPHAGEAL ECHOCARDIOGRAM;  Surgeon: Ivin Poot, MD;  Location: [facility];  Service: Open Heart Surgery;  Laterality: N/A;  . Left heart catheterization with coronary angiogram N/A 01/07/2014    Procedure: LEFT HEART CATHETERIZATION WITH CORONARY ANGIOGRAM;  Surgeon: Lorretta Harp, MD;  Location: [facility] CATH LAB;  Service: Cardiovascular;  Laterality: N/A;    Allergies  Allergen Reactions  . Adhesive [Tape] Other (See Comments)    Tears skin off.   . Latex Other (See Comments)    In tape, tears skin off.  . Zocor [Simvastatin - High Dose] Nausea And Vomiting    Current Outpatient Prescriptions  Medication Sig Dispense Refill  . ACCU-CHEK AVIVA PLUS test strip     . albuterol (PROAIR HFA) 108 (90 BASE) MCG/ACT inhaler Inhale 2 puffs into the lungs every 6 (six) hours as needed for wheezing or shortness of breath. 18 g 11  . ALPRAZolam (XANAX) 1 MG tablet TAKE ONE TABLET BY MOUTH 2 TIMES A DAY AS NEEDED. 60 tablet 2  . aspirin EC 81 MG tablet Take 81 mg by mouth daily.    Marland Kitchen atorvastatin (LIPITOR) 20 MG tablet TAKE ONE TABLET BY MOUTH DAILY. 30 tablet 3  . budesonide-formoterol (SYMBICORT) 160-4.5 MCG/ACT inhaler Inhale 2 puffs into the lungs 2 (two) times daily. 1 Inhaler 12  . canagliflozin (INVOKANA) 300 MG TABS tablet Take 300 mg by mouth daily before breakfast.    .  clobetasol (TEMOVATE) 0.05 % external solution Apply 1 application topically 2 (two) times daily. 50 mL 1  . dabigatran (PRADAXA) 150 MG CAPS capsule Take 1 capsule (150 mg total) by mouth 2 (two) times daily. 60 capsule 5  . digoxin (LANOXIN) 0.125 MG tablet Take 1 tablet (0.125 mg total) by mouth daily. 30 tablet 6  . ferrous [id number] C-folic acid ([name] / FOLTRIN) capsule Take 1 capsule by mouth daily. 30 capsule 6  . FLUoxetine (PROZAC) 20 MG tablet Take 1 tablet (20 mg total) by mouth daily. Take 60mg  once a day for mood 30 tablet 3  . FLUoxetine (PROZAC) 40 MG capsule Take 1 tablet with 20mg  tablet of prozac once a day 30 capsule 3  . furosemide (LASIX) 40 MG tablet Take 0.5 tablets (20 mg total) by mouth daily. Take daily. 30 tablet 2  .  JANUVIA 100 MG tablet TAKE ONE TABLET BY MOUTH DAILY. 30 tablet 6  . ketoconazole (NIZORAL) 2 % shampoo Apply 1 application topically 2 (two) times a week. 120 mL 0  . lisinopril (PRINIVIL,ZESTRIL) 2.5 MG tablet Take 1 tablet (2.5 mg total) by mouth daily. 30 tablet 5  . metoprolol (LOPRESSOR) 50 MG tablet Take 1 tablet (50 mg total) by mouth 2 (two) times daily. 180 tablet 3  . ondansetron (ZOFRAN) 4 MG tablet take 1 tablet by mouth every 6 hours if needed for nausea 20 tablet 1  . oxyCODONE-acetaminophen (PERCOCET) 7.5-325 MG per tablet Take 1 tablet by mouth 2 (two) times daily as needed for pain. 60 tablet 0  . potassium chloride SA (K-DUR,KLOR-CON) 20 MEQ tablet TAKE 1 TABLET BY MOUTH ONCE DAILY. TAKE WITH FUROSEMIDE. 30 tablet 0  . sodium chloride (OCEAN) 0.65 % nasal spray Place 1 spray into the nose as needed for congestion. Congestion    . thiamine 100 MG tablet Take 1 tablet (100 mg total) by mouth daily. 30 tablet 0   No current facility-administered medications for this visit.    History   Social History  . Marital Status: Divorced    Spouse Name: N/A    Number of Children: N/A  . Years of Education: N/A   Occupational  History  . Not on file.   Social History Main Topics  . Smoking status: Former Smoker    Types: Cigarettes    Quit date: 05/26/2013  . Smokeless tobacco: Never Used     Comment: quit 6 months ago  . Alcohol Use: No  . Drug Use: No  . Sexual Activity: Yes    Birth Control/ Protection: Surgical   Other Topics Concern  . Not on file   Social History Narrative    Socially she is divorce. She has one deceased child and 2 grandchildren. She quit tobacco at the end of August 2014.  She now has one great grandchild. There is no alcohol use. She does not routinely exercise.  Family History  Problem Relation Age of Onset  . Diabetes Mother   . Heart disease Mother   . Diabetes Sister   . Hypertension Sister   . Hyperlipidemia Sister   . Diabetes Brother   . Heart disease Brother   . Diabetes Brother   . Heart disease Brother    ROS General: Negative; No fevers, chills, or night sweats; positive for fatigue HEENT: Negative; No changes in vision or hearing, sinus congestion, difficulty swallowing Pulmonary: Positive for mild shortness of breath; history of COPD ;No cough, wheezing, , hemoptysis Cardiovascular: Positive for palpitations.  Mild shortness of breath with activity.  No chest pressure. GI: Positive for occasional nausea, no vomiting, diarrhea, or abdominal pain GU: Negative; No dysuria, hematuria, or difficulty voiding Musculoskeletal: Negative; no myalgias, joint pain, or weakness Hematologic/Oncology: Negative; no easy bruising, bleeding Endocrine: Negative; no heat/cold intolerance; positive for diabetes Neuro: Negative; no changes in balance, headaches Skin: Negative; No rashes or skin lesions Psychiatric: positive for anxiety depression Sleep: Negative; No snoring, daytime sleepiness, hypersomnolence, bruxism, restless legs, hypnogognic hallucinations, no cataplexy Other comprehensive 14 point system review is negative   PE BP 102/62 mmHg  Pulse 102  Ht 5\' 7"   (1.702 m)  Wt 153 lb (69.4 kg)  BMI 23.96 kg/m2  General: Alert, oriented, no distress.  Skin: normal turgor, no rashes HEENT: Normocephalic, atraumatic. Pupils round and reactive; sclera anicteric;no lid lag.  Nose without nasal septal hypertrophy Mouth/Parynx benign;  Mallinpatti scale 2/3 Neck: No JVD, no carotid bruits Chest wall: Mild tenderness over the sternotomy site with suggestion of very minimal separation of the upper sternum. Lungs: Decreased breath sound segmenter long-standing tobacco use. no wheezing or rales Heart: PMI is not displaced.  Rhythm is irregularly irregular with an increased rate of approximately 100 bpm. s1 s2 normal 1/6 systolic murmur; no diastolic murmur Abdomen: soft, nontender; no hepatosplenomehaly, BS+; abdominal aorta nontender and not dilated by palpation. Pulses 2+ Extremities:  leg edema no clubbing cyanosis, Homan's sign negative  Neurologic: grossly nonfocal Psychologic: Normal affect and mood  ECG (independently read by me) atrial fibrillation with a ventricular rate at 10 2 bpm.  Right bundle-branch block with repolarization changes.  Nonspecific ST changes.  09/04/2014 ECG (independently read by me): atrial fibrillation with ventricular response at 1 20 bpm with right bundle branch block, repolarization changes.  04/25/2014 ECG (independently read by me): Normal sinus rhythm at 63 beats per minute.  Recommend followup with repolarization changes.  Prior April 2015 ECG: Normal sinus rhythm with right bundle branch block and repolarization changes.  Prior 10/04/2013 ECG: Atrial fibrillation at a rate At approximately 100-120 beats per minute. Nonspecific ST changes.  LABS:  BMET    Component Value Date/Time   NA 136 09/28/2014 1115   K 4.4 09/28/2014 1115   CL 98 09/28/2014 1115   CO2 29 09/28/2014 1115   GLUCOSE 284* 09/28/2014 1115   BUN 17 09/28/2014 1115   CREATININE 0.79 09/28/2014 1115   CREATININE 0.89 05/29/2014 1330    CALCIUM 10.1 09/28/2014 1115   GFRNONAA 67* 05/29/2014 1330   GFRNONAA 74 09/29/2013 1500   GFRAA 78* 05/29/2014 1330   GFRAA 86 09/29/2013 1500     Hepatic Function Panel     Component Value Date/Time   PROT 7.6 09/28/2014 1115   ALBUMIN 4.4 09/28/2014 1115   AST 14 09/28/2014 1115   ALT 11 09/28/2014 1115   ALKPHOS 65 09/28/2014 1115   BILITOT 0.6 09/28/2014 1115   BILIDIR 0.1 09/29/2013 1459   IBILI 0.3 09/29/2013 1459     CBC    Component Value Date/Time   WBC 8.7 09/28/2014 1115   RBC 4.29 09/28/2014 1115   RBC 3.69* 10/26/2013 0622   HGB 12.8 09/28/2014 1115   HCT 40.5 09/28/2014 1115   PLT 221 09/28/2014 1115   MCV 94.4 09/28/2014 1115   MCH 29.8 09/28/2014 1115   MCHC 31.6 09/28/2014 1115   RDW 14.3 09/28/2014 1115   LYMPHSABS 1.3 09/28/2014 1115   MONOABS 0.6 09/28/2014 1115   EOSABS 0.3 09/28/2014 1115   BASOSABS 0.1 09/28/2014 1115     BNP    Component Value Date/Time   PROBNP 1077.0* 05/29/2014 1330    Lipid Panel     Component Value Date/Time   CHOL 186 09/28/2014 1115   TRIG 144 09/28/2014 1115   HDL 55 09/28/2014 1115   CHOLHDL 3.4 09/28/2014 1115   VLDL 29 09/28/2014 1115   LDLCALC 102* 09/28/2014 1115     RADIOLOGY: No results found.    ASSESSMENT AND PLAN:  Ms. Stachowicz is a 63 year old female who had a prior history of permanent atrial fibrillation.  She also has a history of significant prior tobacco use, but fortunately she has quit smoking.  On 01/09/2014 she underwent CABG surgery for severe multivessel CAD, including left main stenosis.  At that time, she did have a left atrial clip.  Preoperatively, her ejection fraction was 30%.  Postoperatively, her  ejection fraction had improved to 55-60%.  Following her surgery, she was not restarted on Pradaxa since she was maintaining sinus rhythm.  She has developed recurrent atrial fibrillation.  She'll he documented one month ago.  At that time, ventricular rate was in the 120s and I  increased her metoprolol to her current dose of 50 mg twice a day.  She is also on digoxin 0.125 mg for rate control.  Her blood pressure today is a proximally 106/70.  I will slightly titrate her metoprolol to 75 mg twice a day for more improved rate control.  She is not at target LDL less than 70.  By virtue of her recent lipid panel which reveals a LDL cholesterol at 102.  I am further titrating her atorvastatin to 40 mg.  Her diabetes is not well controlled with an hemoglobin MC of 8.4.  She is now on [place].  She most likely will require additional therapy for optimal treatment and she is seeing Dr. Fontaine No who is adjusting her diabetic medications.  She has right bundle branch block which is chronic.  He has shortness of breath with activity which may be a combination of her COPD, as well as recurrent atrial fibrillation.  In March 2016.  I am recommending she undergo a one-year follow-up [facility] Myoview study.  Following her revascularization surgery to assess myocardial perfusion, scar/ischemia.  Repeat laboratory will obtain prior to her follow-up office visit, which will be in March 2016 following her stress test.  Time spent: 25 minutes Troy Sine, MD, [facility]  10/04/2014 2:34 PM

## 2014-10-04 NOTE — Patient Instructions (Signed)
Your physician recommends that you return for lab work in: 3 months FASTING.  Your physician has requested that you have a lexiscan myoview. This will be scheduled in March 2016.  Your physician recommends that you schedule a follow-up appointment in: March 2016.  Your physician has recommended you make the following change in your medication: increase the atorvastatin to 40 mg daily. The lopressor has been increased to 75 mg twice a day. New prescriptions has been sent to your pharmacy to reflect the changes.

## 2014-10-13 IMAGING — CR DG CHEST 2V
2 series · 2 of 2 positions shown · non-contrast
Comparison: PA and lateral chest 01/12/2014.

CLINICAL DATA: Status post CABG and left atrial living 01/09/2014.

EXAM:
CHEST  2 VIEW

[w chest pa]
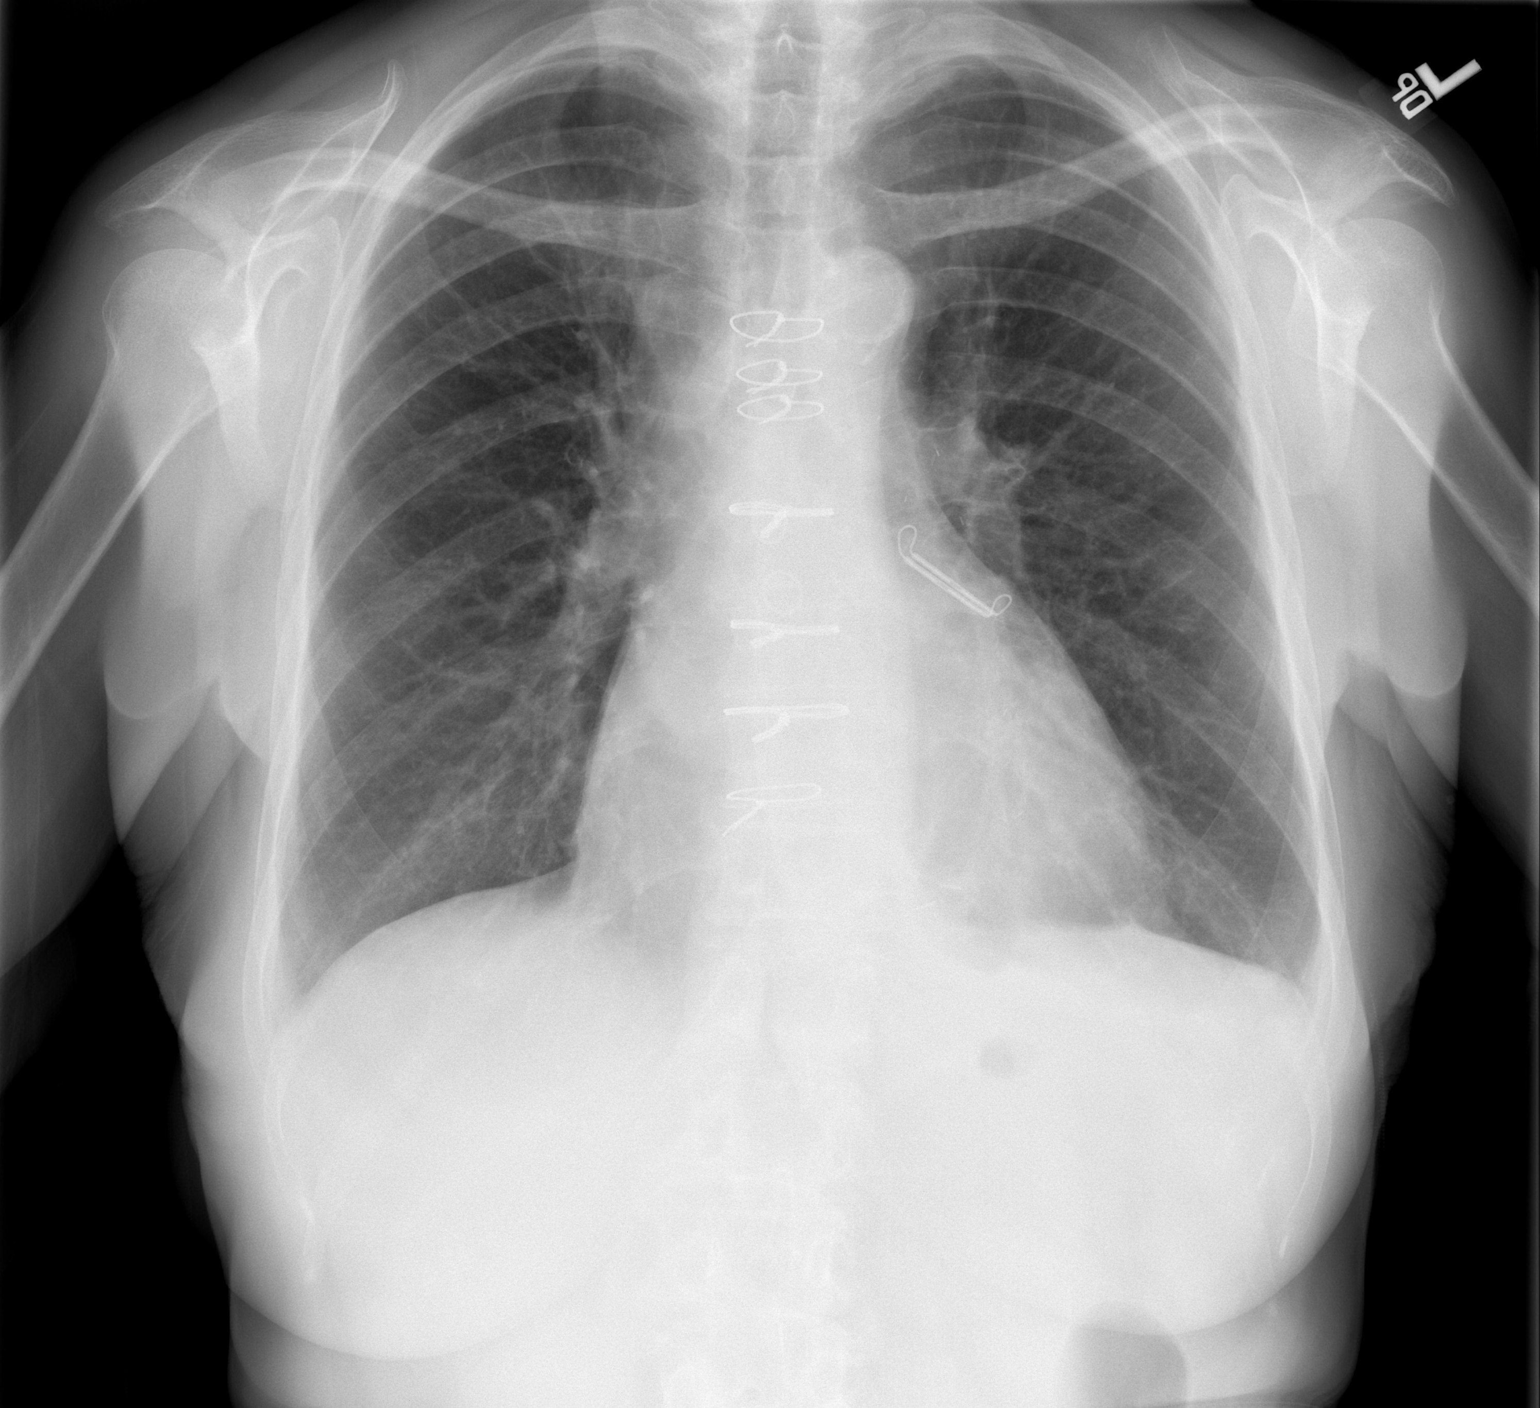

[w chest lat]
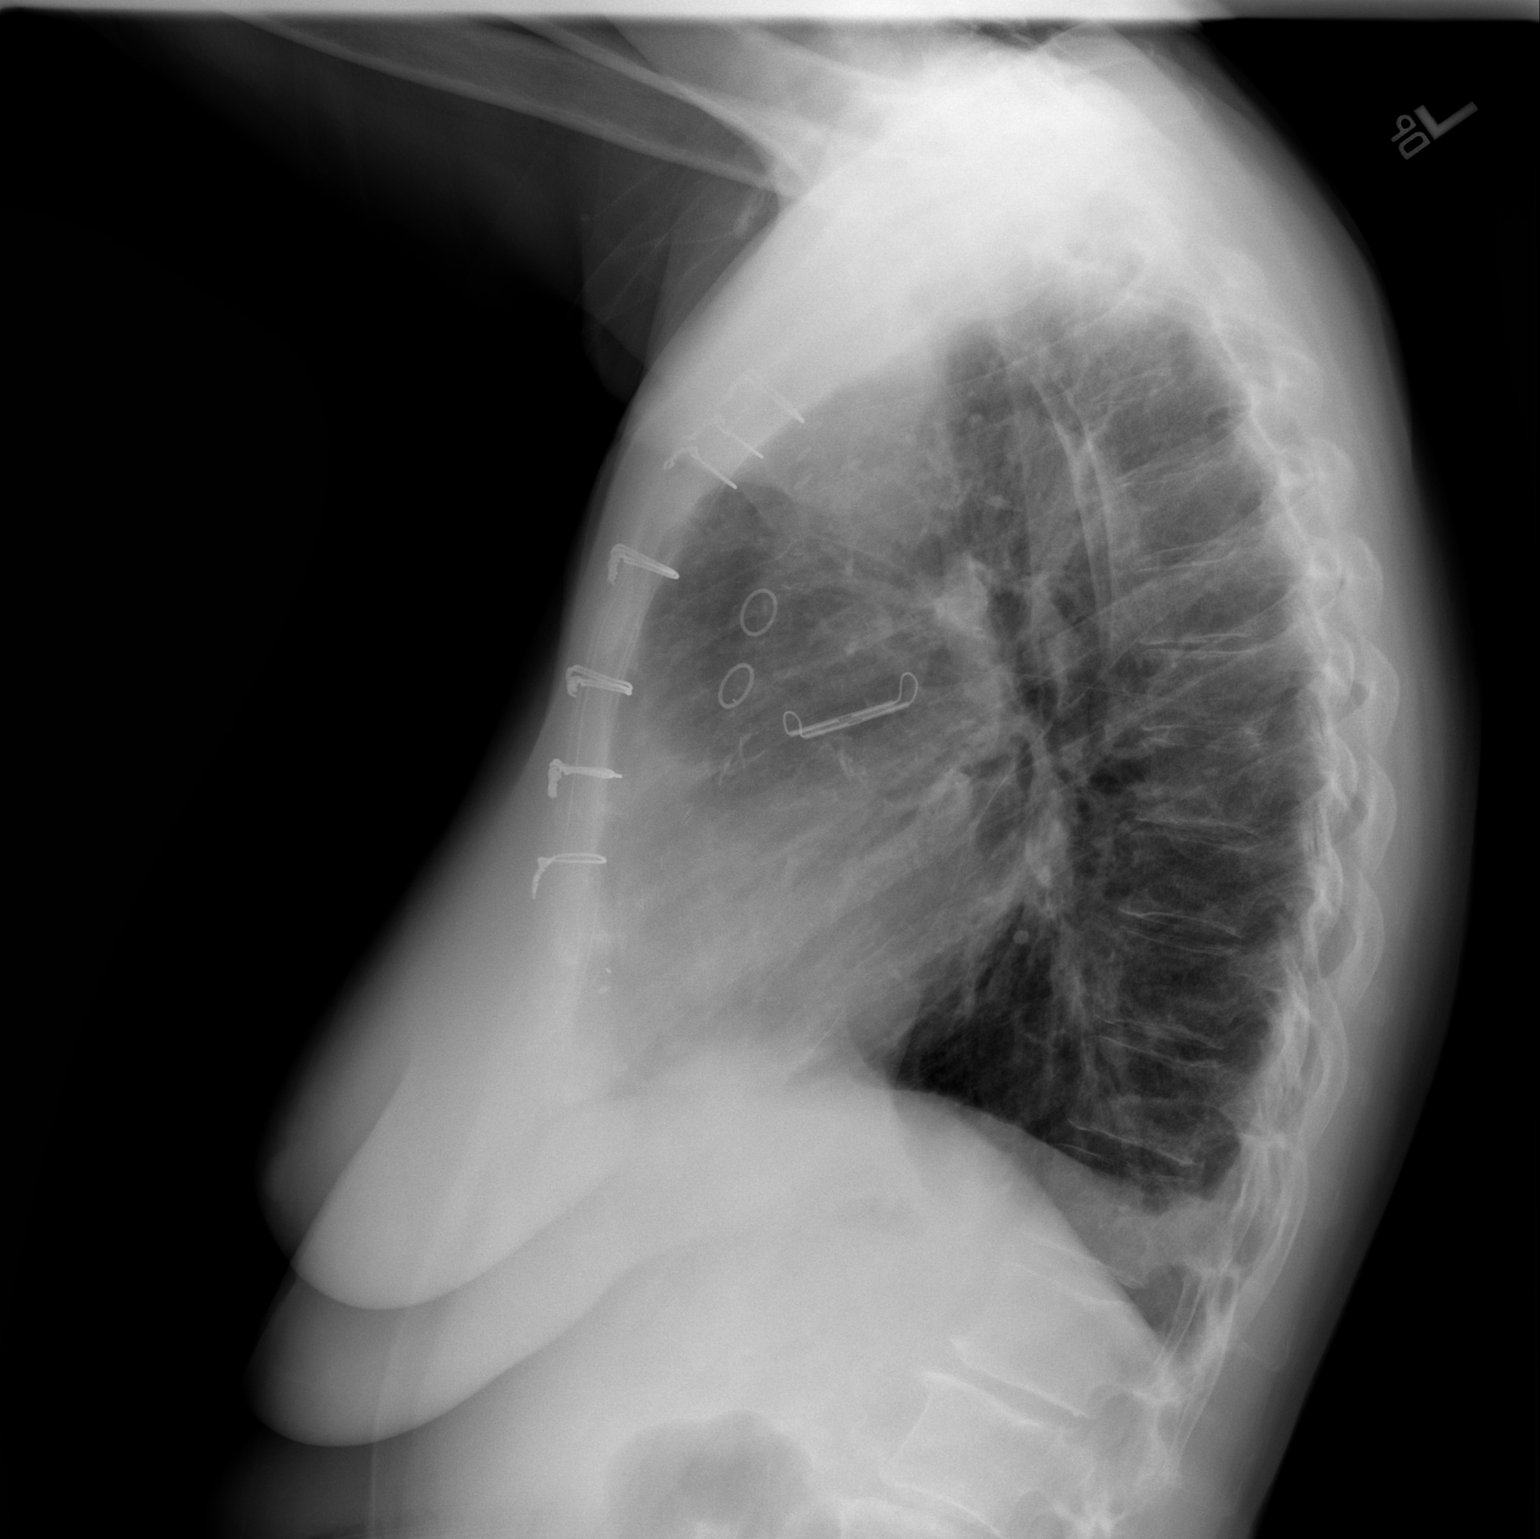

[2 of 2 positions shown; findings below may reference images not displayed]

FINDINGS: The patient is status post CABG and left atrial clipping. Median
sternotomy wires are intact. Small bilateral pleural effusions seen
on the prior study have decreased. Only trace pleural effusions are
now seen. Lungs are clear. No pneumothorax. Heart size is upper
normal.
IMPRESSION: Trace bilateral pleural effusions are decreased since the prior
study. No acute abnormality.

## 2014-10-15 ENCOUNTER — Other Ambulatory Visit: Payer: Self-pay

## 2014-10-15 MED ORDER — LISINOPRIL 2.5 MG PO TABS: 2.5000 mg | ORAL_TABLET | Freq: Every day | ORAL | Status: DC

## 2014-10-15 NOTE — Telephone Encounter (Signed)
Rx was sent to pharmacy electronically. 

## 2014-10-21 ENCOUNTER — Emergency Department (HOSPITAL_COMMUNITY)
Admission: EM | Admit: 2014-10-21 | Discharge: 2014-10-21 | Disposition: A | Discharge status: Home / Self Care | Payer: Medicare Other | Attending: Emergency Medicine | Admitting: Emergency Medicine

## 2014-10-21 ENCOUNTER — Emergency Department (HOSPITAL_COMMUNITY): Payer: Medicare Other

## 2014-10-21 ENCOUNTER — Encounter (HOSPITAL_COMMUNITY): Payer: Self-pay | Admitting: Emergency Medicine

## 2014-10-21 DIAGNOSIS — Z87891 Personal history of nicotine dependence: Secondary | ICD-10-CM | POA: Insufficient documentation

## 2014-10-21 DIAGNOSIS — F419 Anxiety disorder, unspecified: Secondary | ICD-10-CM | POA: Insufficient documentation

## 2014-10-21 DIAGNOSIS — R0789 Other chest pain: Secondary | ICD-10-CM | POA: Diagnosis not present

## 2014-10-21 DIAGNOSIS — G8929 Other chronic pain: Secondary | ICD-10-CM | POA: Insufficient documentation

## 2014-10-21 DIAGNOSIS — I10 Essential (primary) hypertension: Secondary | ICD-10-CM | POA: Diagnosis not present

## 2014-10-21 DIAGNOSIS — K529 Noninfective gastroenteritis and colitis, unspecified: Secondary | ICD-10-CM | POA: Diagnosis not present

## 2014-10-21 DIAGNOSIS — E785 Hyperlipidemia, unspecified: Secondary | ICD-10-CM | POA: Diagnosis not present

## 2014-10-21 DIAGNOSIS — Z7982 Long term (current) use of aspirin: Secondary | ICD-10-CM | POA: Insufficient documentation

## 2014-10-21 DIAGNOSIS — M1990 Unspecified osteoarthritis, unspecified site: Secondary | ICD-10-CM | POA: Insufficient documentation

## 2014-10-21 DIAGNOSIS — J441 Chronic obstructive pulmonary disease with (acute) exacerbation: Secondary | ICD-10-CM | POA: Diagnosis not present

## 2014-10-21 DIAGNOSIS — Z79899 Other long term (current) drug therapy: Secondary | ICD-10-CM | POA: Diagnosis not present

## 2014-10-21 DIAGNOSIS — I4891 Unspecified atrial fibrillation: Secondary | ICD-10-CM | POA: Insufficient documentation

## 2014-10-21 DIAGNOSIS — Z90410 Acquired total absence of pancreas: Secondary | ICD-10-CM | POA: Diagnosis not present

## 2014-10-21 DIAGNOSIS — Z951 Presence of aortocoronary bypass graft: Secondary | ICD-10-CM | POA: Diagnosis not present

## 2014-10-21 DIAGNOSIS — Z91040 Latex allergy status: Secondary | ICD-10-CM | POA: Diagnosis not present

## 2014-10-21 DIAGNOSIS — R079 Chest pain, unspecified: Secondary | ICD-10-CM

## 2014-10-21 DIAGNOSIS — I509 Heart failure, unspecified: Secondary | ICD-10-CM | POA: Diagnosis not present

## 2014-10-21 DIAGNOSIS — E119 Type 2 diabetes mellitus without complications: Secondary | ICD-10-CM | POA: Diagnosis not present

## 2014-10-21 LAB — COMPREHENSIVE METABOLIC PANEL
ALBUMIN: 4.3 g/dL (ref 3.5–5.2)
ALT: 16 U/L (ref 0–35)
AST: 19 U/L (ref 0–37)
Alkaline Phosphatase: 73 U/L (ref 39–117)
Anion gap: 9 (ref 5–15)
BILIRUBIN TOTAL: 0.8 mg/dL (ref 0.3–1.2)
BUN: 14 mg/dL (ref 6–23)
CALCIUM: 9.8 mg/dL (ref 8.4–10.5)
CO2: 31 mmol/L (ref 19–32)
CREATININE: 0.79 mg/dL (ref 0.50–1.10)
Chloride: 92 mEq/L — ABNORMAL LOW (ref 96–112)
GFR calc Af Amer: >90 mL/min (ref >90)
GFR calc non Af Amer: 86 mL/min — ABNORMAL LOW (ref >90)
Glucose, Bld: 183 mg/dL — ABNORMAL HIGH (ref 70–99)
Potassium: 4.1 mmol/L (ref 3.5–5.1)
Sodium: 132 mmol/L — ABNORMAL LOW (ref 135–145)
TOTAL PROTEIN: 7.5 g/dL (ref 6.0–8.3)

## 2014-10-21 LAB — BRAIN NATRIURETIC PEPTIDE: B NATRIURETIC PEPTIDE 5: 176 pg/mL — AB (ref 0.0–100.0)

## 2014-10-21 LAB — CBC WITH DIFFERENTIAL/PLATELET
BASOS ABS: 0 10*3/uL (ref 0.0–0.1)
BASOS PCT: 0 % (ref 0–1)
EOS ABS: 0.1 10*3/uL (ref 0.0–0.7)
Eosinophils Relative: 1 % (ref 0–5)
HEMATOCRIT: 39.8 % (ref 36.0–46.0)
Hemoglobin: 12.7 g/dL (ref 12.0–15.0)
Lymphocytes Relative: 17 % (ref 12–46)
Lymphs Abs: 1.7 10*3/uL (ref 0.7–4.0)
MCH: 30.1 pg (ref 26.0–34.0)
MCHC: 31.9 g/dL (ref 30.0–36.0)
MCV: 94.3 fL (ref 78.0–100.0)
MONO ABS: 0.6 10*3/uL (ref 0.1–1.0)
Monocytes Relative: 6 % (ref 3–12)
NEUTROS ABS: 7.4 10*3/uL (ref 1.7–7.7)
Neutrophils Relative %: 76 % (ref 43–77)
Platelets: 175 10*3/uL (ref 150–400)
RBC: 4.22 MIL/uL (ref 3.87–5.11)
RDW: 13.8 % (ref 11.5–15.5)
WBC: 9.8 10*3/uL (ref 4.0–10.5)

## 2014-10-21 LAB — CBG MONITORING, ED: Glucose-Capillary: 185 mg/dL — ABNORMAL HIGH (ref 70–99)

## 2014-10-21 LAB — TROPONIN I

## 2014-10-21 LAB — LIPASE, BLOOD: LIPASE: 23 U/L (ref 11–59)

## 2014-10-21 MED ORDER — PROMETHAZINE HCL 25 MG/ML IJ SOLN
12.5000 mg | Freq: Once | INTRAMUSCULAR | Status: AC
  Administered 2014-10-21: 12.5 mg via INTRAVENOUS
  Filled 2014-10-21: qty 1

## 2014-10-21 MED ORDER — PROMETHAZINE HCL 25 MG PO TABS: 25.0000 mg | ORAL_TABLET | Freq: Four times a day (QID) | ORAL | Status: DC | PRN

## 2014-10-21 MED ORDER — ONDANSETRON HCL 4 MG/2ML IJ SOLN
4.0000 mg | Freq: Once | INTRAMUSCULAR | Status: AC
  Administered 2014-10-21: 4 mg via INTRAVENOUS
  Filled 2014-10-21: qty 2

## 2014-10-21 MED ORDER — SODIUM CHLORIDE 0.9 % IV BOLUS (SEPSIS)
500.0000 mL | Freq: Once | INTRAVENOUS | Status: AC
  Administered 2014-10-21: 500 mL via INTRAVENOUS

## 2014-10-21 MED ORDER — KETOROLAC TROMETHAMINE 30 MG/ML IJ SOLN
30.0000 mg | Freq: Once | INTRAMUSCULAR | Status: AC
  Administered 2014-10-21: 30 mg via INTRAVENOUS
  Filled 2014-10-21: qty 1

## 2014-10-21 NOTE — ED Notes (Signed)
Patient brought in via EMS from home. Alert and oriented. Airway patent. Patient c/o shortness of breath and nausea x 2 days. Per patient started vomiting today. Patient also reports chest pain starting after shortness of breath with diaphoresis, weakness and dizziness. Per patient hx of COPD and triple bipass surgery.

## 2014-10-21 NOTE — Discharge Instructions (Signed)
Phenergan as needed for nausea.  Return to the ER if you develop severe abdominal pain, bloody stool, chest pain, difficulty breathing, or other new and concerning symptoms.   Viral Gastroenteritis Viral gastroenteritis is also known as stomach flu. This condition affects the stomach and intestinal tract. It can cause sudden diarrhea and vomiting. The illness typically lasts 3 to 8 days. Most people develop an immune response that eventually gets rid of the virus. While this natural response develops, the virus can make you quite ill. CAUSES  Many different viruses can cause gastroenteritis, such as rotavirus or noroviruses. You can catch one of these viruses by consuming contaminated food or water. You may also catch a virus by sharing utensils or other personal items with an infected person or by touching a contaminated surface. SYMPTOMS  The most common symptoms are diarrhea and vomiting. These problems can cause a severe loss of body fluids (dehydration) and a body salt (electrolyte) imbalance. Other symptoms may include:  Fever.  Headache.  Fatigue.  Abdominal pain. DIAGNOSIS  Your caregiver can usually diagnose viral gastroenteritis based on your symptoms and a physical exam. A stool sample may also be taken to test for the presence of viruses or other infections. TREATMENT  This illness typically goes away on its own. Treatments are aimed at rehydration. The most serious cases of viral gastroenteritis involve vomiting so severely that you are not able to keep fluids down. In these cases, fluids must be given through an intravenous line (IV). HOME CARE INSTRUCTIONS   Drink enough fluids to keep your urine clear or pale yellow. Drink small amounts of fluids frequently and increase the amounts as tolerated.  Ask your caregiver for specific rehydration instructions.  Avoid:  Foods high in sugar.  Alcohol.  Carbonated drinks.  Tobacco.  Juice.  Caffeine drinks.  Extremely  hot or cold fluids.  Fatty, greasy foods.  Too much intake of anything at one time.  Dairy products until 24 to 48 hours after diarrhea stops.  You may consume probiotics. Probiotics are active cultures of beneficial bacteria. They may lessen the amount and number of diarrheal stools in adults. Probiotics can be found in yogurt with active cultures and in supplements.  Wash your hands well to avoid spreading the virus.  Only take over-the-counter or prescription medicines for pain, discomfort, or fever as directed by your caregiver. Do not give aspirin to children. Antidiarrheal medicines are not recommended.  Ask your caregiver if you should continue to take your regular prescribed and over-the-counter medicines.  Keep all follow-up appointments as directed by your caregiver. SEEK IMMEDIATE MEDICAL CARE IF:   You are unable to keep fluids down.  You do not urinate at least once every 6 to 8 hours.  You develop shortness of breath.  You notice blood in your stool or vomit. This may look like coffee grounds.  You have abdominal pain that increases or is concentrated in one small area (localized).  You have persistent vomiting or diarrhea.  You have a fever.  The patient is a child younger than 3 months, and he or she has a fever.  The patient is a child older than 3 months, and he or she has a fever and persistent symptoms.  The patient is a child older than 3 months, and he or she has a fever and symptoms suddenly get worse.  The patient is a baby, and he or she has no tears when crying. MAKE SURE YOU:   Understand these  instructions.  Will watch your condition.  Will get help right away if you are not doing well or get worse. Document Released: 10/12/2005 Document Revised: 01/04/2012 Document Reviewed: 07/29/2011 [facility] Patient Information 2015 [place], [place]. This information is not intended to replace advice given to you by your health care provider. Make sure  you discuss any questions you have with your health care provider.

## 2014-10-21 NOTE — ED Notes (Signed)
Pt alert & oriented x4, stable gait. Patient  given discharge instructions, paperwork & prescription(s).  Patient verbalized understanding. Pt left department in wheelchair w/ no further questions. 

## 2014-10-21 NOTE — ED Provider Notes (Signed)
CSN: [id number]     Arrival date & time 10/21/14  1752 History  This chart was scribed for Veryl Speak, MD by Randa Evens, ED Scribe. This patient was seen in room APA07/APA07 and the patient's care was started at [facility] PM.     Chief Complaint  Patient presents with  . Shortness of Breath  . Chest Pain   Patient is a 63 y.o. female presenting with shortness of breath and chest pain. The history is provided by the patient. No language interpreter was used.  Shortness of Breath Severity:  Mild Associated symptoms: chest pain and vomiting   Associated symptoms: no fever   Chest Pain Associated symptoms: nausea, shortness of breath and vomiting   Associated symptoms: no fever    HPI Comments: Mary [name] is a 63 y.o. female with extensive medical history below brought in by ambulance, who presents to the Emergency Department complaining of SOB onset 2 days prior. Pt states that she has associated chest pain (described as chest pressure), nausea, vomiting and weakness. Pt states that she is not on oxygen at home but states that she has tried using her inhalers that haven't provided any relief. Pt states that she has been feeling sick on her stomach for the past several days after a change in her medication by her PCP. Pt states that she has not been able to tolerate liquids or solids. Pt states she has recently been around her sister who has cold symptoms. Pt denies fever or other related symptoms. Pt states she has a hx of COPD and hx of cardiac bypass surgery. PT also states that she is on blood thinners.     Past Medical History  Diagnosis Date  . Diabetes mellitus   . Arthritis   . Anxiety   . Chronic pain     Bacl pain, Disc L5-S1- Dr. Joya Salm in [place]  . Hyperlipidemia   . Hypertension   . Tachycardia   . Bronchial asthma   . Atrial fibrillation   . COPD (chronic obstructive pulmonary disease)   . DDD (degenerative disc disease)     Radicular symptoms  . Shortness of breath    . CHF (congestive heart failure)   . Hx of echocardiogram     Was interpreted by Dr Doylene Canard that showed an Ef in the 50-60% range with grade 1 diastolic dysfunction. she had moderate MR, biatrial enlargement, moderate TR and at that time estimated RV systolic pressure was 43 mm.  . History of stress test 06/2011    Abnormal myocardial perfusion study.   Past Surgical History  Procedure Laterality Date  . Carpal tunnel release      x 2  . Breast cyst removed    . Abdominal hysterectomy      partial  . Tubal ligation    . Tonsillectomy    . Coronary artery bypass graft N/A 01/09/2014    Procedure: CORONARY ARTERY BYPASS GRAFTING (CABG) x 3 using endoscopically harvested right saphenous vein and left internal mammary artery and closure of left atrial appendage;  Surgeon: Ivin Poot, MD;  Location: [place];  Service: Open Heart Surgery;  Laterality: N/A;  patient has preop IA BP   . Intraoperative transesophageal echocardiogram N/A 01/09/2014    Procedure: INTRAOPERATIVE TRANSESOPHAGEAL ECHOCARDIOGRAM;  Surgeon: Ivin Poot, MD;  Location: [place];  Service: Open Heart Surgery;  Laterality: N/A;  . Left heart catheterization with coronary angiogram N/A 01/07/2014    Procedure: LEFT HEART CATHETERIZATION WITH CORONARY  ANGIOGRAM;  Surgeon: Lorretta Harp, MD;  Location: [facility] CATH LAB;  Service: Cardiovascular;  Laterality: N/A;   Family History  Problem Relation Age of Onset  . Diabetes Mother   . Heart disease Mother   . Diabetes Sister   . Hypertension Sister   . Hyperlipidemia Sister   . Diabetes Brother   . Heart disease Brother   . Diabetes Brother   . Heart disease Brother    History  Substance Use Topics  . Smoking status: Former Smoker -- 1.50 packs/day for 30 years    Types: Cigarettes    Quit date: 05/26/2013  . Smokeless tobacco: Never Used     Comment: quit 6 months ago  . Alcohol Use: No   OB History    Gravida Para Term Preterm AB TAB SAB Ectopic Multiple  Living   1 1 1        0     Review of Systems  Constitutional: Negative for fever.  Respiratory: Positive for shortness of breath.   Cardiovascular: Positive for chest pain.  Gastrointestinal: Positive for nausea and vomiting.  All other systems reviewed and are negative.   Allergies  Adhesive; Latex; and Zocor  Home Medications   Prior to Admission medications   Medication Sig Start Date End Date Taking? Authorizing Provider  ACCU-CHEK AVIVA PLUS test strip  06/05/14   Historical Provider, MD  albuterol (PROAIR HFA) 108 (90 BASE) MCG/ACT inhaler Inhale 2 puffs into the lungs every 6 (six) hours as needed for wheezing or shortness of breath. 08/14/14   Alycia Rossetti, MD  ALPRAZolam Duanne Moron) 1 MG tablet TAKE ONE TABLET BY MOUTH 2 TIMES A DAY AS NEEDED. 09/03/14   Alycia Rossetti, MD  aspirin EC 81 MG tablet Take 81 mg by mouth daily.    Historical Provider, MD  atorvastatin (LIPITOR) 40 MG tablet Take 1 tablet (40 mg total) by mouth daily. 10/04/14   Troy Sine, MD  budesonide-formoterol [facility]) 160-4.5 MCG/ACT inhaler Inhale 2 puffs into the lungs 2 (two) times daily. 10/27/13   Nita Sells, MD  canagliflozin (INVOKANA) 300 MG TABS tablet Take 300 mg by mouth daily before breakfast.    Historical Provider, MD  clobetasol (TEMOVATE) 0.05 % external solution Apply 1 application topically 2 (two) times daily. 08/14/14   Alycia Rossetti, MD  dabigatran (PRADAXA) 150 MG CAPS capsule Take 1 capsule (150 mg total) by mouth 2 (two) times daily. 09/14/14   Troy Sine, MD  digoxin (LANOXIN) 0.125 MG tablet Take 1 tablet (0.125 mg total) by mouth daily. 09/04/14   Troy Sine, MD  ferrous [id number] C-folic acid ([name] / FOLTRIN) capsule Take 1 capsule by mouth daily. 03/07/14   Alycia Rossetti, MD  FLUoxetine (PROZAC) 20 MG tablet Take 1 tablet (20 mg total) by mouth daily. Take 60mg  once a day for mood 10/02/14   Alycia Rossetti, MD  FLUoxetine (PROZAC) 40 MG  capsule Take 1 tablet with 20mg  tablet of prozac once a day 10/02/14   Alycia Rossetti, MD  furosemide (LASIX) 40 MG tablet Take 0.5 tablets (20 mg total) by mouth daily. Take daily. 09/04/14   Troy Sine, MD  JANUVIA 100 MG tablet TAKE ONE TABLET BY MOUTH DAILY. 08/14/14   Alycia Rossetti, MD  ketoconazole (NIZORAL) 2 % shampoo Apply 1 application topically 2 (two) times a week. 08/14/14   Alycia Rossetti, MD  lisinopril (PRINIVIL,ZESTRIL) 2.5 MG tablet Take 1 tablet (2.5 mg  total) by mouth daily. 10/15/14   Troy Sine, MD  metoprolol (LOPRESSOR) 50 MG tablet Take 1.5 tablets (75 mg total) by mouth 2 (two) times daily. 10/04/14   Troy Sine, MD  ondansetron [facility]) 4 MG tablet take 1 tablet by mouth every 6 hours if needed for nausea 06/26/14   Alycia Rossetti, MD  oxyCODONE-acetaminophen (PERCOCET) 7.5-325 MG per tablet Take 1 tablet by mouth 2 (two) times daily as needed for pain. 10/02/14   Alycia Rossetti, MD  potassium chloride SA (K-DUR,KLOR-CON) 20 MEQ tablet TAKE 1 TABLET BY MOUTH ONCE DAILY. TAKE WITH FUROSEMIDE. 08/27/14   Alycia Rossetti, MD  sodium chloride (OCEAN) 0.65 % nasal spray Place 1 spray into the nose as needed for congestion. Congestion    Historical Provider, MD  thiamine 100 MG tablet Take 1 tablet (100 mg total) by mouth daily. 10/27/13   Nita Sells, MD   Triage Vitals: BP 131/73 mmHg  Pulse 75  Temp(Src) 97.8 F (36.6 C) (Oral)  Resp 19  Ht 5\' 6"  (1.676 m)  Wt 155 lb (70.308 kg)  BMI 25.03 kg/m2  SpO2 99%  Physical Exam  Constitutional: She is oriented to person, place, and time. She appears well-developed and well-nourished. No distress.  Appear anxious.   HENT:  Head: Normocephalic and atraumatic.  Mouth/Throat: Oropharynx is clear and moist.  Eyes: Conjunctivae and EOM are normal.  Neck: Normal range of motion. Neck supple. No tracheal deviation present.  Cardiovascular:  No murmur heard. irregularly irregular.  Pulmonary/Chest:  Effort normal and breath sounds normal. No respiratory distress. She has no wheezes. She has no rales.  Musculoskeletal: Normal range of motion. She exhibits no edema.  Neurological: She is alert and oriented to person, place, and time. No cranial nerve deficit. She exhibits normal muscle tone. Coordination normal.  Skin: Skin is warm and dry.  Psychiatric: She has a normal mood and affect. Her behavior is normal.  Nursing note and vitals reviewed.   ED Course  Procedures (including critical care time) DIAGNOSTIC STUDIES: Oxygen Saturation is 99% on 2L [place], normal by my interpretation.    COORDINATION OF CARE: 6:25 PM-Discussed treatment plan with pt at bedside and pt agreed to plan.     Labs Review Labs Reviewed  TROPONIN I  CBC WITH DIFFERENTIAL  COMPREHENSIVE METABOLIC PANEL  CBG MONITORING, ED    Imaging Review No results found.   Date: 10/21/2014  Rate: 72  Rhythm: normal sinus rhythm  QRS Axis: normal  Intervals: normal  ST/T Wave abnormalities: nonspecific T wave changes  Conduction Disutrbances:right bundle branch block  Narrative Interpretation:   Old EKG Reviewed: none available    MDM   Final diagnoses:  Chest pain      Patient presents with complaints of nausea and vomiting. She appears very weak and short of breath. She appears somewhat anxious, however does not appear toxic or in distress. Workup reveals laboratory studies which are reassuring. Her chest x-ray is clear, there is no hypoxia, and EKG and troponin do not reflect a cardiac etiology. I suspect her symptoms are viral in nature as she is now feeling better with Phenergan, Toradol, and IV fluids. She is to return as needed if her symptoms worsen or change.   I personally performed the services described in this documentation, which was scribed in my presence. The recorded information has been reviewed and is accurate.      Veryl Speak, MD 10/21/14 2049

## 2014-10-23 ENCOUNTER — Telehealth: Payer: Self-pay | Admitting: Family Medicine

## 2014-10-23 NOTE — Telephone Encounter (Signed)
Continue taking current meds I do not think the Invokana dose change caused the gastroenteritis Watch her sugar inake Schedule appt in 4 weeks- Bring her meter to appt

## 2014-10-23 NOTE — Telephone Encounter (Signed)
Call placed to patient and patient made aware.   Appointment scheduled.  

## 2014-10-23 NOTE — Telephone Encounter (Signed)
-----   Message from [place], LPN sent at [phone]  9:11 AM EST ----- Regarding: RE: F/U CBG on Invokana 300 Call placed to patient.   Reports that her FSBS remains elevated. States that her AM fasting FSBS has been running between 180-200. States that her afternoon and evening FSBS ren between 200-300.  Also reports that she was seen in ER on 10/21/2014 for abdominal issues, but was told that she had a viral illness. States that she is not sure that her stomach cramping is viral, but it may be from taking DM meds.   MD to be made aware.   ----- Message -----    From: Alycia Rossetti, MD    Sent: 10/22/2014   8:19 AM      To: [place], LPN Subject: FW: F/U CBG on Invokana 300                    Please call and see what CBG are running with Invokana at 300mg    ----- Message -----    From: Alycia Rossetti, MD    Sent: 10/22/2014      To: Alycia Rossetti, MD Subject: F/U CBG on Invokana 300

## 2014-10-25 ENCOUNTER — Other Ambulatory Visit: Payer: Self-pay | Admitting: *Deleted

## 2014-10-25 MED ORDER — CANAGLIFLOZIN 300 MG PO TABS: 300.0000 mg | ORAL_TABLET | Freq: Every day | ORAL | Status: DC

## 2014-10-25 NOTE — Telephone Encounter (Signed)
Received fax from pharmacy requesting clarification of Invokana dosage.   Advised that patient should be taking Invokana 300mg  PO QD.

## 2014-10-29 ENCOUNTER — Telehealth: Payer: Self-pay | Admitting: Family Medicine

## 2014-10-29 MED ORDER — OXYCODONE-ACETAMINOPHEN 7.5-325 MG PO TABS: 1.0000 | ORAL_TABLET | Freq: Two times a day (BID) | ORAL | Status: DC | PRN

## 2014-10-29 NOTE — Telephone Encounter (Signed)
Prescription printed and patient made aware to come to office to pick up.  

## 2014-10-29 NOTE — Telephone Encounter (Signed)
Okay to refill? 

## 2014-10-29 NOTE — Telephone Encounter (Signed)
[phone] Pt is needing a refill on oxyCODONE-acetaminophen (PERCOCET) 7.5-325 MG per tablet

## 2014-10-29 NOTE — Telephone Encounter (Signed)
Ok to refill??  Last office visit/ refill 10/02/2014.

## 2014-11-19 ENCOUNTER — Ambulatory Visit: Payer: Self-pay | Admitting: Family Medicine

## 2014-11-19 ENCOUNTER — Emergency Department (HOSPITAL_COMMUNITY)
Admission: EM | Admit: 2014-11-19 | Discharge: 2014-11-19 | Disposition: A | Discharge status: Home / Self Care | Payer: Medicare Other | Attending: Emergency Medicine | Admitting: Emergency Medicine

## 2014-11-19 ENCOUNTER — Emergency Department (HOSPITAL_COMMUNITY): Payer: Medicare Other

## 2014-11-19 ENCOUNTER — Encounter (HOSPITAL_COMMUNITY): Payer: Self-pay | Admitting: *Deleted

## 2014-11-19 DIAGNOSIS — R630 Anorexia: Secondary | ICD-10-CM | POA: Diagnosis not present

## 2014-11-19 DIAGNOSIS — R110 Nausea: Secondary | ICD-10-CM | POA: Diagnosis present

## 2014-11-19 DIAGNOSIS — Z8619 Personal history of other infectious and parasitic diseases: Secondary | ICD-10-CM | POA: Insufficient documentation

## 2014-11-19 DIAGNOSIS — G8929 Other chronic pain: Secondary | ICD-10-CM | POA: Diagnosis not present

## 2014-11-19 DIAGNOSIS — Z87891 Personal history of nicotine dependence: Secondary | ICD-10-CM | POA: Diagnosis not present

## 2014-11-19 DIAGNOSIS — Z951 Presence of aortocoronary bypass graft: Secondary | ICD-10-CM | POA: Diagnosis not present

## 2014-11-19 DIAGNOSIS — Z7982 Long term (current) use of aspirin: Secondary | ICD-10-CM | POA: Insufficient documentation

## 2014-11-19 DIAGNOSIS — I509 Heart failure, unspecified: Secondary | ICD-10-CM | POA: Diagnosis not present

## 2014-11-19 DIAGNOSIS — I4891 Unspecified atrial fibrillation: Secondary | ICD-10-CM | POA: Diagnosis not present

## 2014-11-19 DIAGNOSIS — F419 Anxiety disorder, unspecified: Secondary | ICD-10-CM | POA: Insufficient documentation

## 2014-11-19 DIAGNOSIS — Z79899 Other long term (current) drug therapy: Secondary | ICD-10-CM | POA: Diagnosis not present

## 2014-11-19 DIAGNOSIS — Z91040 Latex allergy status: Secondary | ICD-10-CM | POA: Insufficient documentation

## 2014-11-19 DIAGNOSIS — J449 Chronic obstructive pulmonary disease, unspecified: Secondary | ICD-10-CM | POA: Insufficient documentation

## 2014-11-19 DIAGNOSIS — M1990 Unspecified osteoarthritis, unspecified site: Secondary | ICD-10-CM | POA: Diagnosis not present

## 2014-11-19 DIAGNOSIS — R1084 Generalized abdominal pain: Secondary | ICD-10-CM | POA: Insufficient documentation

## 2014-11-19 DIAGNOSIS — E785 Hyperlipidemia, unspecified: Secondary | ICD-10-CM | POA: Insufficient documentation

## 2014-11-19 DIAGNOSIS — Z7951 Long term (current) use of inhaled steroids: Secondary | ICD-10-CM | POA: Insufficient documentation

## 2014-11-19 DIAGNOSIS — E119 Type 2 diabetes mellitus without complications: Secondary | ICD-10-CM | POA: Diagnosis not present

## 2014-11-19 LAB — URINE MICROSCOPIC-ADD ON

## 2014-11-19 LAB — CBC WITH DIFFERENTIAL/PLATELET
BASOS PCT: 0 % (ref 0–1)
Basophils Absolute: 0 10*3/uL (ref 0.0–0.1)
EOS ABS: 0.1 10*3/uL (ref 0.0–0.7)
Eosinophils Relative: 0 % (ref 0–5)
HEMATOCRIT: 41.7 % (ref 36.0–46.0)
Hemoglobin: 13.4 g/dL (ref 12.0–15.0)
Lymphocytes Relative: 16 % (ref 12–46)
Lymphs Abs: 1.8 10*3/uL (ref 0.7–4.0)
MCH: 29.9 pg (ref 26.0–34.0)
MCHC: 32.1 g/dL (ref 30.0–36.0)
MCV: 93.1 fL (ref 78.0–100.0)
MONO ABS: 0.7 10*3/uL (ref 0.1–1.0)
MONOS PCT: 6 % (ref 3–12)
Neutro Abs: 8.6 10*3/uL — ABNORMAL HIGH (ref 1.7–7.7)
Neutrophils Relative %: 78 % — ABNORMAL HIGH (ref 43–77)
Platelets: 204 10*3/uL (ref 150–400)
RBC: 4.48 MIL/uL (ref 3.87–5.11)
RDW: 14.2 % (ref 11.5–15.5)
WBC: 11.2 10*3/uL — AB (ref 4.0–10.5)

## 2014-11-19 LAB — COMPREHENSIVE METABOLIC PANEL
ALT: 18 U/L (ref 0–35)
ANION GAP: 13 (ref 5–15)
AST: 22 U/L (ref 0–37)
Albumin: 4.1 g/dL (ref 3.5–5.2)
Alkaline Phosphatase: 72 U/L (ref 39–117)
BILIRUBIN TOTAL: 1.2 mg/dL (ref 0.3–1.2)
BUN: 19 mg/dL (ref 6–23)
CHLORIDE: 95 mmol/L — AB (ref 96–112)
CO2: 24 mmol/L (ref 19–32)
Calcium: 9.8 mg/dL (ref 8.4–10.5)
Creatinine, Ser: 0.81 mg/dL (ref 0.50–1.10)
GFR, EST AFRICAN AMERICAN: 86 mL/min — AB (ref >90)
GFR, EST NON AFRICAN AMERICAN: 75 mL/min — AB (ref >90)
Glucose, Bld: 210 mg/dL — ABNORMAL HIGH (ref 70–99)
Potassium: 4.1 mmol/L (ref 3.5–5.1)
Sodium: 132 mmol/L — ABNORMAL LOW (ref 135–145)
Total Protein: 7.4 g/dL (ref 6.0–8.3)

## 2014-11-19 LAB — TROPONIN I

## 2014-11-19 LAB — URINALYSIS, ROUTINE W REFLEX MICROSCOPIC
HGB URINE DIPSTICK: NEGATIVE
KETONES UR: 40 mg/dL — AB
LEUKOCYTES UA: NEGATIVE
NITRITE: NEGATIVE
PH: 6 (ref 5.0–8.0)
PROTEIN: NEGATIVE mg/dL
SPECIFIC GRAVITY, URINE: 1.015 (ref 1.005–1.030)
UROBILINOGEN UA: 0.2 mg/dL (ref 0.0–1.0)

## 2014-11-19 LAB — LIPASE, BLOOD: LIPASE: 23 U/L (ref 11–59)

## 2014-11-19 MED ORDER — SODIUM CHLORIDE 0.9 % IV SOLN
1000.0000 mL | Freq: Once | INTRAVENOUS | Status: AC
  Administered 2014-11-19: 1000 mL via INTRAVENOUS

## 2014-11-19 MED ORDER — ONDANSETRON HCL 4 MG/2ML IJ SOLN
4.0000 mg | Freq: Once | INTRAMUSCULAR | Status: AC
Start: 2014-11-19 — End: 2014-11-19
  Administered 2014-11-19: 4 mg via INTRAVENOUS
  Filled 2014-11-19: qty 2

## 2014-11-19 MED ORDER — ONDANSETRON HCL 4 MG PO TABS: ORAL_TABLET | ORAL | Status: DC

## 2014-11-19 NOTE — ED Notes (Signed)
Pt states she has been nauseated for the past 3 days & started having some dizziness today.

## 2014-11-19 NOTE — Discharge Instructions (Signed)

## 2014-11-19 NOTE — ED Provider Notes (Signed)
CSN: [id number]     Arrival date & time 11/19/14  [id number] History  This chart was scribed for Richarda Blade, MD by Edison Simon, ED Scribe. This patient was seen in room APA18/APA18 and the patient's care was started at 7:15 AM.    Chief Complaint  Patient presents with  . Nausea   The history is provided by the patient. No language interpreter was used.    HPI Comments: Mary [name] is a 64 y.o. female who presents to the Emergency Department BIBA complaining of nausea with onset 3 days ago. She reports a similar episode 3 weeks ago and was diagnosed here with a virus. She denies vomiting but notes she had vomiting during previous episodes. She states she has not eaten in 3 days because trying to eat make her more nauseas. She states she has been drinking less fluids than usual and states she feels dehydrated. She reports associated dizziness when rising. She notes her sister and sister-in-law are both sick with colds and notes she is exposed to secondhand smoke at home. She reports history of CHF, DM, COPD, and CABG. She states she is compliant with her medications and notes that her sugar has been recently because her PCP took her off Metformin 1 month ago because she is taking Lasix. She was placed on Januvia and Invokana instead, and her Invokana dose was increased by her cardiologist last month. She denies diarrhea or weakness.  PCP: Vic Blackbird, MD  Cardiologist: Dr. Claiborne Billings   Past Medical History  Diagnosis Date  . Diabetes mellitus   . Arthritis   . Anxiety   . Chronic pain     Bacl pain, Disc L5-S1- Dr. Joya Salm in [place]  . Hyperlipidemia   . Hypertension   . Tachycardia   . Bronchial asthma   . Atrial fibrillation   . COPD (chronic obstructive pulmonary disease)   . DDD (degenerative disc disease)     Radicular symptoms  . Shortness of breath   . CHF (congestive heart failure)   . Hx of echocardiogram     Was interpreted by Dr Doylene Canard that showed an Ef in the 50-60% range with  grade 1 diastolic dysfunction. she had moderate MR, biatrial enlargement, moderate TR and at that time estimated RV systolic pressure was 43 mm.  . History of stress test 06/2011    Abnormal myocardial perfusion study.   Past Surgical History  Procedure Laterality Date  . Carpal tunnel release      x 2  . Breast cyst removed    . Abdominal hysterectomy      partial  . Tubal ligation    . Tonsillectomy    . Coronary artery bypass graft N/A 01/09/2014    Procedure: CORONARY ARTERY BYPASS GRAFTING (CABG) x 3 using endoscopically harvested right saphenous vein and left internal mammary artery and closure of left atrial appendage;  Surgeon: Ivin Poot, MD;  Location: [place];  Service: Open Heart Surgery;  Laterality: N/A;  patient has preop IA BP   . Intraoperative transesophageal echocardiogram N/A 01/09/2014    Procedure: INTRAOPERATIVE TRANSESOPHAGEAL ECHOCARDIOGRAM;  Surgeon: Ivin Poot, MD;  Location: [place];  Service: Open Heart Surgery;  Laterality: N/A;  . Left heart catheterization with coronary angiogram N/A 01/07/2014    Procedure: LEFT HEART CATHETERIZATION WITH CORONARY ANGIOGRAM;  Surgeon: Lorretta Harp, MD;  Location: [facility] CATH LAB;  Service: Cardiovascular;  Laterality: N/A;   Family History  Problem Relation Age of Onset  .  Diabetes Mother   . Heart disease Mother   . Diabetes Sister   . Hypertension Sister   . Hyperlipidemia Sister   . Diabetes Brother   . Heart disease Brother   . Diabetes Brother   . Heart disease Brother    History  Substance Use Topics  . Smoking status: Former Smoker -- 1.50 packs/day for 30 years    Types: Cigarettes    Quit date: 05/26/2013  . Smokeless tobacco: Never Used     Comment: quit 6 months ago  . Alcohol Use: No   OB History    Gravida Para Term Preterm AB TAB SAB Ectopic Multiple Living   1 1 1        0     Review of Systems  Constitutional: Positive for appetite change.  Gastrointestinal: Positive for nausea.  Negative for vomiting and diarrhea.  Neurological: Positive for dizziness. Negative for weakness.  All other systems reviewed and are negative.     Allergies  Adhesive; Latex; and Zocor  Home Medications   Prior to Admission medications   Medication Sig Start Date End Date Taking? Authorizing Provider  albuterol (PROAIR HFA) 108 (90 BASE) MCG/ACT inhaler Inhale 2 puffs into the lungs every 6 (six) hours as needed for wheezing or shortness of breath. 08/14/14  Yes Alycia Rossetti, MD  ALPRAZolam Duanne Moron) 1 MG tablet TAKE ONE TABLET BY MOUTH 2 TIMES A DAY AS NEEDED. Patient taking differently: TAKE ONE TABLET BY MOUTH 2 TIMES A DAY AS NEEDED FOR ANXIETY. 09/03/14  Yes Alycia Rossetti, MD  aspirin EC 81 MG tablet Take 81 mg by mouth daily.   Yes Historical Provider, MD  atorvastatin (LIPITOR) 40 MG tablet Take 1 tablet (40 mg total) by mouth daily. 10/04/14  Yes Troy Sine, MD  budesonide-formoterol [facility]) 160-4.5 MCG/ACT inhaler Inhale 2 puffs into the lungs 2 (two) times daily. Patient taking differently: Inhale 2 puffs into the lungs 2 (two) times daily as needed (for shortness of breath).  10/27/13  Yes Nita Sells, MD  canagliflozin (INVOKANA) 300 MG TABS tablet Take 300 mg by mouth daily before breakfast. 10/25/14  Yes Alycia Rossetti, MD  clobetasol (TEMOVATE) 0.05 % external solution Apply 1 application topically 2 (two) times daily. Patient taking differently: Apply 1 application topically 2 (two) times daily as needed (for affected rash/red areas).  08/14/14  Yes Alycia Rossetti, MD  dabigatran (PRADAXA) 150 MG CAPS capsule Take 1 capsule (150 mg total) by mouth 2 (two) times daily. 09/14/14  Yes Troy Sine, MD  digoxin (LANOXIN) 0.125 MG tablet Take 1 tablet (0.125 mg total) by mouth daily. 09/04/14  Yes Troy Sine, MD  ferrous Q000111Q C-folic acid ([name] / FOLTRIN) capsule Take 1 capsule by mouth daily. 03/07/14  Yes Alycia Rossetti, MD   FLUoxetine (PROZAC) 20 MG tablet Take 1 tablet (20 mg total) by mouth daily. Take 60mg  once a day for mood Patient taking differently: Take 60 mg by mouth daily.  10/02/14  Yes Alycia Rossetti, MD  FLUoxetine (PROZAC) 40 MG capsule Take 1 tablet with 20mg  tablet of prozac once a day Patient taking differently: Take 60 mg by mouth daily. Take 1 tablet with 20mg  tablet of prozac once a day 10/02/14  Yes Alycia Rossetti, MD  furosemide (LASIX) 40 MG tablet Take 0.5 tablets (20 mg total) by mouth daily. Take daily. 09/04/14  Yes Troy Sine, MD  JANUVIA 100 MG tablet TAKE ONE TABLET BY  MOUTH DAILY. 08/14/14  Yes Alycia Rossetti, MD  ketoconazole (NIZORAL) 2 % shampoo Apply 1 application topically 2 (two) times a week. 08/14/14  Yes Alycia Rossetti, MD  lisinopril (PRINIVIL,ZESTRIL) 2.5 MG tablet Take 1 tablet (2.5 mg total) by mouth daily. 10/15/14  Yes Troy Sine, MD  metoprolol (LOPRESSOR) 50 MG tablet Take 1.5 tablets (75 mg total) by mouth 2 (two) times daily. Patient taking differently: Take 50 mg by mouth 3 (three) times daily.  10/04/14  Yes Troy Sine, MD  oxyCODONE-acetaminophen (PERCOCET) 7.5-325 MG per tablet Take 1 tablet by mouth 2 (two) times daily as needed for pain. 10/29/14  Yes Alycia Rossetti, MD  potassium chloride SA (K-DUR,KLOR-CON) 20 MEQ tablet TAKE 1 TABLET BY MOUTH ONCE DAILY. TAKE WITH FUROSEMIDE. 08/27/14  Yes Alycia Rossetti, MD  promethazine (PHENERGAN) 25 MG tablet Take 1 tablet (25 mg total) by mouth every 6 (six) hours as needed for nausea. 10/21/14  Yes Veryl Speak, MD  sodium chloride (OCEAN) 0.65 % nasal spray Place 1 spray into the nose as needed for congestion. Congestion   Yes Historical Provider, MD  thiamine 100 MG tablet Take 1 tablet (100 mg total) by mouth daily. 10/27/13  Yes Nita Sells, MD  ACCU-CHEK AVIVA PLUS test strip  06/05/14   Historical Provider, MD  ondansetron (ZOFRAN) 4 MG tablet take 1 tablet by mouth every 6 hours if needed  for nausea 11/19/14   Richarda Blade, MD   BP 140/67 mmHg  Pulse 83  Temp(Src) 98 F (36.7 C) (Oral)  Resp 17  Ht 5\' 7"  (1.702 m)  Wt 152 lb (68.947 kg)  BMI 23.80 kg/m2  SpO2 97% Physical Exam  Constitutional: She is oriented to person, place, and time. She appears well-developed and well-nourished.  HENT:  Head: Normocephalic and atraumatic.  Eyes: Conjunctivae and EOM are normal. Pupils are equal, round, and reactive to light.  Neck: Normal range of motion and phonation normal. Neck supple.  Cardiovascular: Normal rate and regular rhythm.   Pulmonary/Chest: Effort normal and breath sounds normal. She exhibits tenderness (Tender left anterior chest wall).  Abdominal: Soft. Bowel sounds are normal. She exhibits no distension. There is tenderness (mild diffuse). There is no guarding.  Musculoskeletal: Normal range of motion.  Neurological: She is alert and oriented to person, place, and time. She exhibits normal muscle tone.  Skin: Skin is warm and dry.  Psychiatric: She has a normal mood and affect. Her behavior is normal. Judgment and thought content normal.  Nursing note and vitals reviewed.   ED Course  Procedures (including critical care time)  DIAGNOSTIC STUDIES: Oxygen Saturation is 99% on room air, normal by my interpretation.    COORDINATION OF CARE: 7:22 AM Discussed treatment plan with patient at beside, the patient agrees with the plan and has no further questions at this time.  09:15- oral fluid challenge ordered-  9:40 AM Discussed with patient that her tests have been good and I suspect her symptoms may be due to medication interaction.   Labs Review Labs Reviewed  CBC WITH DIFFERENTIAL/PLATELET - Abnormal; Notable for the following:    WBC 11.2 (*)    Neutrophils Relative % 78 (*)    Neutro Abs 8.6 (*)    All other components within normal limits  COMPREHENSIVE METABOLIC PANEL - Abnormal; Notable for the following:    Sodium 132 (*)    Chloride 95 (*)     Glucose, Bld 210 (*)  GFR calc non Af Amer 75 (*)    GFR calc Af Amer 86 (*)    All other components within normal limits  URINALYSIS, ROUTINE W REFLEX MICROSCOPIC - Abnormal; Notable for the following:    Glucose, UA >1000 (*)    Bilirubin Urine SMALL (*)    Ketones, ur 40 (*)    All other components within normal limits  URINE MICROSCOPIC-ADD ON - Abnormal; Notable for the following:    Squamous Epithelial / LPF MANY (*)    All other components within normal limits  LIPASE, BLOOD  TROPONIN I    Imaging Review Dg Chest 2 View  11/19/2014   CLINICAL DATA:  Nausea for 3 days. Chest pressure since bypass surgery 1 year ago. Initial encounter.  EXAM: CHEST  2 VIEW  COMPARISON:  Radiographs 10/21/2014 and 05/29/2014.  FINDINGS: The heart size and mediastinal contours are stable status post median sternotomy and CABG. Left atrial clip noted. The lungs are clear. There is no pleural effusion or pneumothorax. No acute osseous findings demonstrated.  IMPRESSION: Stable postoperative chest.  No acute cardiopulmonary process.   Electronically Signed   By: Camie Patience M.D.   On: 11/19/2014 07:59     EKG Interpretation   Date/Time:  Monday November 19 2014 07:44:20 EST Ventricular Rate:  83 PR Interval:    QRS Duration: 123 QT Interval:  370 QTC Calculation: 435 R Axis:   41 Text Interpretation:  Atrial fibrillation Right bundle branch block Repol  abnrm suggests ischemia, diffuse leads Since last tracing inferior  ST  depression is new Confirmed by Jullia Mulligan  MD, Barnard Sharps [id number]) on 11/19/2014  8:09:08 AM      MDM   Final diagnoses:  Nausea     Recurrent nausea with decreased oral intake.  Medical evaluation is overall stable and reassuring.  I doubt intra-abdominal infection, gastroenteritis, metabolic instability, or serious bacterial infection.  She is on numerous medications and there been recent medication changes.  There is no indication for hospitalization at this time.  It is  likely that her medications are causing her symptoms.  Nursing Notes Reviewed/ Care Coordinated Applicable Imaging Reviewed Interpretation of Laboratory Data incorporated into ED treatment  The patient appears reasonably screened and/or stabilized for discharge and I doubt any other medical condition or other [facility] requiring further screening, evaluation, or treatment in the ED at this time prior to discharge.  Plan: Home Medications- Zofran; Home Treatments- rest, gradually advance diet; return here if the recommended treatment, does not improve the symptoms; Recommended follow up- PCP 1 week  I personally performed the services described in this documentation, which was scribed in my presence. The recorded information has been reviewed and is accurate.     Richarda Blade, MD 11/19/14 [phone]

## 2014-11-19 NOTE — ED Notes (Signed)
Patient given diet ginger ale for PO challenge.

## 2014-11-19 NOTE — ED Notes (Signed)
MD at bedside. 

## 2014-11-23 ENCOUNTER — Ambulatory Visit (INDEPENDENT_AMBULATORY_CARE_PROVIDER_SITE_OTHER): Payer: Medicare Other | Admitting: Family Medicine

## 2014-11-23 ENCOUNTER — Other Ambulatory Visit: Payer: Self-pay | Admitting: Family Medicine

## 2014-11-23 ENCOUNTER — Encounter: Payer: Self-pay | Admitting: Family Medicine

## 2014-11-23 VITALS — BP 102/70 | HR 80 | Temp 98.1°F | Resp 18 | Wt 151.0 lb

## 2014-11-23 DIAGNOSIS — E1141 Type 2 diabetes mellitus with diabetic mononeuropathy: Secondary | ICD-10-CM

## 2014-11-23 DIAGNOSIS — J0100 Acute maxillary sinusitis, unspecified: Secondary | ICD-10-CM

## 2014-11-23 MED ORDER — AMOXICILLIN 500 MG PO CAPS: 500.0000 mg | ORAL_CAPSULE | Freq: Three times a day (TID) | ORAL | Status: DC

## 2014-11-23 MED ORDER — METFORMIN HCL 500 MG PO TABS: 500.0000 mg | ORAL_TABLET | Freq: Two times a day (BID) | ORAL | Status: DC

## 2014-11-23 MED ORDER — OXYCODONE-ACETAMINOPHEN 7.5-325 MG PO TABS: 1.0000 | ORAL_TABLET | Freq: Two times a day (BID) | ORAL | Status: DC | PRN

## 2014-11-23 MED ORDER — ALPRAZOLAM 1 MG PO TABS: ORAL_TABLET | ORAL | Status: DC

## 2014-11-23 NOTE — Patient Instructions (Signed)
Stop the Invokana Start metformin take 1 pill with supper for 3 days, then 1 pill twice a day with meals Continue Januvia Antibiotics and nasal saline for your sinuses F/U 3 months

## 2014-11-23 NOTE — Assessment & Plan Note (Signed)
Restart MTF 500mg  BID, d/c invokana, continue [place] Renal function good, mild diastolic dysfunction, no concerns from cardiology standpoint, monitor if any symptoms of CHF

## 2014-11-23 NOTE — Progress Notes (Signed)
Patient ID: Mary [name], female   DOB: 1951/02/04, 64 y.o.   MRN: [id number]   Subjective:    Patient ID: Mary [name], female    DOB: 04/24/1951, 64 y.o.   MRN: [id number]  Patient presents for medication check patient here for interim follow-up on her diabetes mellitus. Since she started the increased dose of an [place] Go she has had worsening nausea and some vomiting she's actually been in the ER twice she has noticed it has been after she has taken the medication. Her blood sugars continue to be elevated her seven-day average is 196 her 14 day average is 227 her 30 day average is 250. She's not had any hypoglycemia symptoms. Actually took her off of the metformin because she was hospitalized for mild heart failure diastolic at the time her renal function has been good. I reviewed her last cardiology note and they did not appear to have any concerns with metformin. She's also had some sinus drainage with some blood-tinged drainage from the nose as well as pain and tenderness over the bilateral sinus for the past week or so. She's not had any change in her cough or her breathing regarding her COPD    Review Of Systems:  GEN- denies fatigue, fever, weight loss,weakness, recent illness HEENT- denies eye drainage, change in vision,+ nasal discharge, CVS- denies chest pain, palpitations RESP- denies SOB, cough, wheeze ABD- denies N/V, change in stools, abd pain GU- denies dysuria, hematuria, dribbling, incontinence MSK- + joint pain, muscle aches, injury Neuro- denies headache, dizziness, syncope, seizure activity       Objective:    BP 102/70 mmHg  Pulse 80  Temp(Src) 98.1 F (36.7 C)  Resp 18  Wt 151 lb (68.493 kg) GEN- NAD, alert and oriented x3 HEENT- PERRL, EOMI, non injected sclera, pink conjunctiva, MMM, oropharynx clear, + maxillary sinus tenderness, L> r, NARES- + discharge, enlarged turbinates Neck- Supple,  CVS- irregular rhythem, normal rate, no murmur RESP-CTAB          Assessment & Plan:      Problem List Items Addressed This Visit      Unprioritized   Diabetes mellitus   Relevant Medications   metFORMIN (GLUCOPHAGE) tablet    Other Visit Diagnoses    Acute maxillary sinusitis, recurrence not specified    -  Primary    Amox, nasal saline    Relevant Medications    amoxicillin (AMOXIL) capsule       Note: This dictation was prepared with Dragon dictation along with smaller phrase technology. Any transcriptional errors that result from this process are unintentional.

## 2014-12-17 ENCOUNTER — Other Ambulatory Visit: Payer: Self-pay | Admitting: Family Medicine

## 2014-12-17 ENCOUNTER — Telehealth: Payer: Self-pay | Admitting: Family Medicine

## 2014-12-17 MED ORDER — OXYCODONE-ACETAMINOPHEN 7.5-325 MG PO TABS: 1.0000 | ORAL_TABLET | Freq: Two times a day (BID) | ORAL | Status: DC | PRN

## 2014-12-17 NOTE — Telephone Encounter (Signed)
Prescription printed and patient made aware to come to office to pick up.  

## 2014-12-17 NOTE — Telephone Encounter (Signed)
Ok to refill??  Last office visit/ refill 11/23/2014.

## 2014-12-17 NOTE — Telephone Encounter (Signed)
Refill appropriate and filled per protocol. 

## 2014-12-17 NOTE — Telephone Encounter (Signed)
Patient calling for rx for her percocet to pick up on Thursday if possible  [phone]

## 2014-12-17 NOTE — Telephone Encounter (Signed)
Okay to refill? 

## 2014-12-18 ENCOUNTER — Encounter (HOSPITAL_COMMUNITY): Payer: Self-pay | Admitting: Emergency Medicine

## 2014-12-18 ENCOUNTER — Emergency Department (HOSPITAL_COMMUNITY): Payer: Medicare Other

## 2014-12-18 ENCOUNTER — Emergency Department (HOSPITAL_COMMUNITY)
Admission: EM | Admit: 2014-12-18 | Discharge: 2014-12-18 | Disposition: A | Discharge status: Home / Self Care | Payer: Medicare Other | Attending: Emergency Medicine | Admitting: Emergency Medicine

## 2014-12-18 DIAGNOSIS — Z7982 Long term (current) use of aspirin: Secondary | ICD-10-CM | POA: Diagnosis not present

## 2014-12-18 DIAGNOSIS — Z951 Presence of aortocoronary bypass graft: Secondary | ICD-10-CM | POA: Diagnosis not present

## 2014-12-18 DIAGNOSIS — E119 Type 2 diabetes mellitus without complications: Secondary | ICD-10-CM | POA: Diagnosis not present

## 2014-12-18 DIAGNOSIS — Z9889 Other specified postprocedural states: Secondary | ICD-10-CM | POA: Diagnosis not present

## 2014-12-18 DIAGNOSIS — E785 Hyperlipidemia, unspecified: Secondary | ICD-10-CM | POA: Insufficient documentation

## 2014-12-18 DIAGNOSIS — R197 Diarrhea, unspecified: Secondary | ICD-10-CM | POA: Insufficient documentation

## 2014-12-18 DIAGNOSIS — Z91040 Latex allergy status: Secondary | ICD-10-CM | POA: Diagnosis not present

## 2014-12-18 DIAGNOSIS — Z87891 Personal history of nicotine dependence: Secondary | ICD-10-CM | POA: Insufficient documentation

## 2014-12-18 DIAGNOSIS — F419 Anxiety disorder, unspecified: Secondary | ICD-10-CM | POA: Insufficient documentation

## 2014-12-18 DIAGNOSIS — I509 Heart failure, unspecified: Secondary | ICD-10-CM | POA: Insufficient documentation

## 2014-12-18 DIAGNOSIS — Z9851 Tubal ligation status: Secondary | ICD-10-CM | POA: Diagnosis not present

## 2014-12-18 DIAGNOSIS — R112 Nausea with vomiting, unspecified: Secondary | ICD-10-CM | POA: Insufficient documentation

## 2014-12-18 DIAGNOSIS — G8929 Other chronic pain: Secondary | ICD-10-CM | POA: Diagnosis not present

## 2014-12-18 DIAGNOSIS — Z79899 Other long term (current) drug therapy: Secondary | ICD-10-CM | POA: Insufficient documentation

## 2014-12-18 DIAGNOSIS — J441 Chronic obstructive pulmonary disease with (acute) exacerbation: Secondary | ICD-10-CM | POA: Insufficient documentation

## 2014-12-18 DIAGNOSIS — M1990 Unspecified osteoarthritis, unspecified site: Secondary | ICD-10-CM | POA: Diagnosis not present

## 2014-12-18 DIAGNOSIS — R1084 Generalized abdominal pain: Secondary | ICD-10-CM | POA: Diagnosis not present

## 2014-12-18 DIAGNOSIS — R109 Unspecified abdominal pain: Secondary | ICD-10-CM

## 2014-12-18 DIAGNOSIS — Z90710 Acquired absence of both cervix and uterus: Secondary | ICD-10-CM | POA: Insufficient documentation

## 2014-12-18 LAB — BASIC METABOLIC PANEL
Anion gap: 10 (ref 5–15)
BUN: 11 mg/dL (ref 6–23)
CALCIUM: 9 mg/dL (ref 8.4–10.5)
CO2: 30 mmol/L (ref 19–32)
Chloride: 94 mmol/L — ABNORMAL LOW (ref 96–112)
Creatinine, Ser: 0.73 mg/dL (ref 0.50–1.10)
GFR calc Af Amer: >90 mL/min (ref >90)
GFR calc non Af Amer: 88 mL/min — ABNORMAL LOW (ref >90)
Glucose, Bld: 151 mg/dL — ABNORMAL HIGH (ref 70–99)
Potassium: 3.7 mmol/L (ref 3.5–5.1)
Sodium: 134 mmol/L — ABNORMAL LOW (ref 135–145)

## 2014-12-18 LAB — CBC WITH DIFFERENTIAL/PLATELET
BASOS ABS: 0 10*3/uL (ref 0.0–0.1)
Basophils Relative: 0 % (ref 0–1)
Eosinophils Absolute: 0 10*3/uL (ref 0.0–0.7)
Eosinophils Relative: 0 % (ref 0–5)
HEMATOCRIT: 37.7 % (ref 36.0–46.0)
HEMOGLOBIN: 12.4 g/dL (ref 12.0–15.0)
LYMPHS ABS: 1.2 10*3/uL (ref 0.7–4.0)
Lymphocytes Relative: 8 % — ABNORMAL LOW (ref 12–46)
MCH: 30.4 pg (ref 26.0–34.0)
MCHC: 32.9 g/dL (ref 30.0–36.0)
MCV: 92.4 fL (ref 78.0–100.0)
MONOS PCT: 6 % (ref 3–12)
Monocytes Absolute: 0.8 10*3/uL (ref 0.1–1.0)
NEUTROS PCT: 86 % — AB (ref 43–77)
Neutro Abs: 11.7 10*3/uL — ABNORMAL HIGH (ref 1.7–7.7)
Platelets: 197 10*3/uL (ref 150–400)
RBC: 4.08 MIL/uL (ref 3.87–5.11)
RDW: 13.9 % (ref 11.5–15.5)
WBC: 13.8 10*3/uL — ABNORMAL HIGH (ref 4.0–10.5)

## 2014-12-18 LAB — URINE MICROSCOPIC-ADD ON

## 2014-12-18 LAB — URINALYSIS, ROUTINE W REFLEX MICROSCOPIC
Glucose, UA: NEGATIVE mg/dL
Hgb urine dipstick: NEGATIVE
LEUKOCYTES UA: NEGATIVE
Nitrite: NEGATIVE
PH: 6 (ref 5.0–8.0)
PROTEIN: 30 mg/dL — AB
Specific Gravity, Urine: >1.03 — ABNORMAL HIGH (ref 1.005–1.030)
Urobilinogen, UA: 0.2 mg/dL (ref 0.0–1.0)

## 2014-12-18 LAB — HEPATIC FUNCTION PANEL
ALBUMIN: 4.2 g/dL (ref 3.5–5.2)
ALK PHOS: 61 U/L (ref 39–117)
ALT: 14 U/L (ref 0–35)
AST: 19 U/L (ref 0–37)
Bilirubin, Direct: 0.2 mg/dL (ref 0.0–0.5)
Indirect Bilirubin: 0.7 mg/dL (ref 0.3–0.9)
TOTAL PROTEIN: 7.4 g/dL (ref 6.0–8.3)
Total Bilirubin: 0.9 mg/dL (ref 0.3–1.2)

## 2014-12-18 LAB — LIPASE, BLOOD: Lipase: 18 U/L (ref 11–59)

## 2014-12-18 MED ORDER — IOHEXOL 300 MG/ML  SOLN
100.0000 mL | Freq: Once | INTRAMUSCULAR | Status: AC | PRN
  Administered 2014-12-18: 100 mL via INTRAVENOUS

## 2014-12-18 MED ORDER — FAMOTIDINE IN NACL 20-0.9 MG/50ML-% IV SOLN
20.0000 mg | Freq: Once | INTRAVENOUS | Status: AC
  Administered 2014-12-18: 20 mg via INTRAVENOUS
  Filled 2014-12-18: qty 50

## 2014-12-18 MED ORDER — ONDANSETRON HCL 4 MG PO TABS: 4.0000 mg | ORAL_TABLET | Freq: Three times a day (TID) | ORAL | Status: DC | PRN

## 2014-12-18 MED ORDER — ONDANSETRON HCL 4 MG/2ML IJ SOLN
4.0000 mg | INTRAMUSCULAR | Status: DC | PRN
  Administered 2014-12-18: 4 mg via INTRAVENOUS
  Filled 2014-12-18: qty 2

## 2014-12-18 MED ORDER — SODIUM CHLORIDE 0.9 % IV SOLN
INTRAVENOUS | Status: DC
  Administered 2014-12-18: 13:00:00 via INTRAVENOUS

## 2014-12-18 NOTE — ED Provider Notes (Signed)
CSN: [id number]     Arrival date & time 12/18/14  1009 History   First MD Initiated Contact with Patient 12/18/14 1157     Chief Complaint  Patient presents with  . Abdominal Pain      HPI  Pt was seen at 1230. Per pt, c/o gradual onset and persistence of multiple intermittent episodes of N/V/D that began 2 days ago.   Describes the stools as "watery." Has been associated with generalized "aching" abd pain. States she had similar symptoms 2 weeks ago, tx symptomatically, and "got better" after her ED visit.  Denies CP/SOB, no back pain, no fevers, no black or blood in stools or emesis.    Past Medical History  Diagnosis Date  . Diabetes mellitus   . Arthritis   . Anxiety   . Chronic pain     Bacl pain, Disc L5-S1- Dr. Joya Salm in [place]  . Hyperlipidemia   . Hypertension   . Tachycardia   . Bronchial asthma   . Atrial fibrillation   . COPD (chronic obstructive pulmonary disease)   . DDD (degenerative disc disease)     Radicular symptoms  . Shortness of breath   . CHF (congestive heart failure)   . Hx of echocardiogram     Was interpreted by Dr Doylene Canard that showed an Ef in the 50-60% range with grade 1 diastolic dysfunction. she had moderate MR, biatrial enlargement, moderate TR and at that time estimated RV systolic pressure was 43 mm.  . History of stress test 06/2011    Abnormal myocardial perfusion study.   Past Surgical History  Procedure Laterality Date  . Carpal tunnel release      x 2  . Breast cyst removed    . Abdominal hysterectomy      partial  . Tubal ligation    . Tonsillectomy    . Coronary artery bypass graft N/A 01/09/2014    Procedure: CORONARY ARTERY BYPASS GRAFTING (CABG) x 3 using endoscopically harvested right saphenous vein and left internal mammary artery and closure of left atrial appendage;  Surgeon: Ivin Poot, MD;  Location: [facility];  Service: Open Heart Surgery;  Laterality: N/A;  patient has preop IA BP   . Intraoperative transesophageal  echocardiogram N/A 01/09/2014    Procedure: INTRAOPERATIVE TRANSESOPHAGEAL ECHOCARDIOGRAM;  Surgeon: Ivin Poot, MD;  Location: [facility];  Service: Open Heart Surgery;  Laterality: N/A;  . Left heart catheterization with coronary angiogram N/A 01/07/2014    Procedure: LEFT HEART CATHETERIZATION WITH CORONARY ANGIOGRAM;  Surgeon: Lorretta Harp, MD;  Location: [facility] CATH LAB;  Service: Cardiovascular;  Laterality: N/A;   Family History  Problem Relation Age of Onset  . Diabetes Mother   . Heart disease Mother   . Diabetes Sister   . Hypertension Sister   . Hyperlipidemia Sister   . Diabetes Brother   . Heart disease Brother   . Diabetes Brother   . Heart disease Brother    History  Substance Use Topics  . Smoking status: Former Smoker -- 1.50 packs/day for 30 years    Types: Cigarettes    Quit date: 05/26/2013  . Smokeless tobacco: Never Used     Comment: quit 6 months ago  . Alcohol Use: No   OB History    Gravida Para Term Preterm AB TAB SAB Ectopic Multiple Living   1 1 1        0     Review of Systems ROS: Statement: All systems negative except as  marked or noted in the HPI; Constitutional: Negative for fever and chills. ; ; Eyes: Negative for eye pain, redness and discharge. ; ; ENMT: Negative for ear pain, hoarseness, nasal congestion, sinus pressure and sore throat. ; ; Cardiovascular: Negative for chest pain, palpitations, diaphoresis, dyspnea and peripheral edema. ; ; Respiratory: Negative for cough, wheezing and stridor. ; ; Gastrointestinal: +N/V/D, abd pain. Negative for blood in stool, hematemesis, jaundice and rectal bleeding. . ; ; Genitourinary: Negative for dysuria, flank pain and hematuria. ; ; Musculoskeletal: Negative for back pain and neck pain. Negative for swelling and trauma.; ; Skin: Negative for pruritus, rash, abrasions, blisters, bruising and skin lesion.; ; Neuro: Negative for headache, lightheadedness and neck stiffness. Negative for weakness, altered level  of consciousness , altered mental status, extremity weakness, paresthesias, involuntary movement, seizure and syncope.      Allergies  Adhesive; Latex; and Zocor  Home Medications   Prior to Admission medications   Medication Sig Start Date End Date Taking? Authorizing Provider  albuterol (PROAIR HFA) 108 (90 BASE) MCG/ACT inhaler Inhale 2 puffs into the lungs every 6 (six) hours as needed for wheezing or shortness of breath. 08/14/14  Yes Alycia Rossetti, MD  ALPRAZolam Duanne Moron) 1 MG tablet TAKE ONE TABLET BY MOUTH 2 TIMES A DAY AS NEEDED. 11/23/14  Yes Alycia Rossetti, MD  aspirin EC 81 MG tablet Take 81 mg by mouth daily.   Yes Historical Provider, MD  atorvastatin (LIPITOR) 40 MG tablet Take 1 tablet (40 mg total) by mouth daily. 10/04/14  Yes Troy Sine, MD  clobetasol (TEMOVATE) 0.05 % external solution Apply 1 application topically 2 (two) times daily. Patient taking differently: Apply 1 application topically 2 (two) times daily as needed (for affected rash/red areas).  08/14/14  Yes Alycia Rossetti, MD  dabigatran (PRADAXA) 150 MG CAPS capsule Take 1 capsule (150 mg total) by mouth 2 (two) times daily. 09/14/14  Yes Troy Sine, MD  digoxin (LANOXIN) 0.125 MG tablet Take 1 tablet (0.125 mg total) by mouth daily. 09/04/14  Yes Troy Sine, MD  ferrous [id number] C-folic acid ([name] / FOLTRIN) capsule Take 1 capsule by mouth daily. 03/07/14  Yes Alycia Rossetti, MD  FLUoxetine (PROZAC) 20 MG tablet Take 1 tablet (20 mg total) by mouth daily. Take 60mg  once a day for mood Patient taking differently: Take 60 mg by mouth daily.  10/02/14  Yes Alycia Rossetti, MD  FLUoxetine (PROZAC) 40 MG capsule Take 1 tablet with 20mg  tablet of prozac once a day Patient taking differently: Take 60 mg by mouth daily. Take 1 tablet with 20mg  tablet of prozac once a day 10/02/14  Yes Alycia Rossetti, MD  furosemide (LASIX) 40 MG tablet Take 0.5 tablets (20 mg total) by mouth daily.  Take daily. 09/04/14  Yes Troy Sine, MD  JANUVIA 100 MG tablet TAKE ONE TABLET BY MOUTH DAILY. 08/14/14  Yes Alycia Rossetti, MD  ketoconazole (NIZORAL) 2 % shampoo Apply 1 application topically 2 (two) times a week. 08/14/14  Yes Alycia Rossetti, MD  lisinopril (PRINIVIL,ZESTRIL) 2.5 MG tablet Take 1 tablet (2.5 mg total) by mouth daily. 10/15/14  Yes Troy Sine, MD  metFORMIN (GLUCOPHAGE) 500 MG tablet Take 1 tablet (500 mg total) by mouth 2 (two) times daily with a meal. 11/23/14  Yes Alycia Rossetti, MD  metoprolol (LOPRESSOR) 50 MG tablet Take 1.5 tablets (75 mg total) by mouth 2 (two) times daily. 10/04/14  Yes  Troy Sine, MD  ondansetron [facility]) 4 MG tablet take 1 tablet by mouth every 6 hours if needed for nausea 11/19/14  Yes Richarda Blade, MD  oxyCODONE-acetaminophen (PERCOCET) 7.5-325 MG per tablet Take 1 tablet by mouth 2 (two) times daily as needed for pain. 12/17/14  Yes Alycia Rossetti, MD  potassium chloride SA (K-DUR,KLOR-CON) 20 MEQ tablet TAKE 1 TABLET BY MOUTH ONCE DAILY. TAKE WITH FUROSEMIDE. 08/27/14  Yes Alycia Rossetti, MD  promethazine (PHENERGAN) 25 MG tablet Take 1 tablet (25 mg total) by mouth every 6 (six) hours as needed for nausea. 10/21/14  Yes Veryl Speak, MD  sodium chloride (OCEAN) 0.65 % nasal spray Place 1 spray into the nose as needed for congestion. Congestion   Yes Historical Provider, MD  SYMBICORT 160-4.5 MCG/ACT inhaler INHALE 2 PUFFS INTO THE LUNGS TWICE DAILY. RINSE MOUTH AFTER USE. 12/17/14  Yes Alycia Rossetti, MD  thiamine 100 MG tablet Take 1 tablet (100 mg total) by mouth daily. 10/27/13  Yes Nita Sells, MD  amoxicillin (AMOXIL) 500 MG capsule Take 1 capsule (500 mg total) by mouth 3 (three) times daily. Patient not taking: Reported on 12/18/2014 11/23/14   Alycia Rossetti, MD  INVOKANA 300 MG TABS tablet  10/25/14   Historical Provider, MD   BP 142/85 mmHg  Pulse 88  Temp(Src) 98.5 F (36.9 C)  Resp 18  Ht 5\' 7"  (1.702  m)  Wt 153 lb (69.4 kg)  BMI 23.96 kg/m2  SpO2 97% Physical Exam  1235: Physical examination:  Nursing notes reviewed; Vital signs and O2 SAT reviewed;  Constitutional: Well developed, Well nourished, Well hydrated, In no acute distress; Head:  Normocephalic, atraumatic; Eyes: EOMI, PERRL, No scleral icterus; ENMT: Mouth and pharynx normal, Mucous membranes moist; Neck: Supple, Full range of motion, No lymphadenopathy; Cardiovascular: Regular rate and rhythm, No gallop; Respiratory: Breath sounds clear & equal bilaterally, No wheezes.  Speaking full sentences with ease, Normal respiratory effort/excursion; Chest: Nontender, Movement normal; Abdomen: Soft, +very mild diffuse tenderness to palp. No rebound or guarding. Nondistended, Normal bowel sounds; Genitourinary: No CVA tenderness; Extremities: Pulses normal, No tenderness, No edema, No calf edema or asymmetry.; Neuro: AA&Ox3, Major CN grossly intact.  Speech clear. No gross focal motor or sensory deficits in extremities.; Skin: Color normal, Warm, Dry.   ED Course  Procedures     EKG Interpretation None      MDM  MDM Reviewed: previous chart, nursing note and vitals Reviewed previous: labs Interpretation: labs and CT scan      Results for orders placed or performed during the hospital encounter of 12/18/14  CBC with Differential  Result Value Ref Range   WBC 13.8 (H) 4.0 - 10.5 K/uL   RBC 4.08 3.87 - 5.11 MIL/uL   Hemoglobin 12.4 12.0 - 15.0 g/dL   HCT 37.7 36.0 - 46.0 %   MCV 92.4 78.0 - 100.0 fL   MCH 30.4 26.0 - 34.0 pg   MCHC 32.9 30.0 - 36.0 g/dL   RDW 13.9 11.5 - 15.5 %   Platelets 197 150 - 400 K/uL   Neutrophils Relative % 86 (H) 43 - 77 %   Neutro Abs 11.7 (H) 1.7 - 7.7 K/uL   Lymphocytes Relative 8 (L) 12 - 46 %   Lymphs Abs 1.2 0.7 - 4.0 K/uL   Monocytes Relative 6 3 - 12 %   Monocytes Absolute 0.8 0.1 - 1.0 K/uL   Eosinophils Relative 0 0 - 5 %  Eosinophils Absolute 0.0 0.0 - 0.7 K/uL   Basophils  Relative 0 0 - 1 %   Basophils Absolute 0.0 0.0 - 0.1 K/uL  Basic metabolic panel  Result Value Ref Range   Sodium 134 (L) 135 - 145 mmol/L   Potassium 3.7 3.5 - 5.1 mmol/L   Chloride 94 (L) 96 - 112 mmol/L   CO2 30 19 - 32 mmol/L   Glucose, Bld 151 (H) 70 - 99 mg/dL   BUN 11 6 - 23 mg/dL   Creatinine, Ser 0.73 0.50 - 1.10 mg/dL   Calcium 9.0 8.4 - 10.5 mg/dL   GFR calc non Af Amer 88 (L) >90 mL/min   GFR calc Af Amer >90 >90 mL/min   Anion gap 10 5 - 15  Urinalysis, Routine w reflex microscopic  Result Value Ref Range   Color, Urine YELLOW YELLOW   APPearance CLEAR CLEAR   Specific Gravity, Urine >1.030 (H) 1.005 - 1.030   pH 6.0 5.0 - 8.0   Glucose, UA NEGATIVE NEGATIVE mg/dL   Hgb urine dipstick NEGATIVE NEGATIVE   Bilirubin Urine MODERATE (A) NEGATIVE   Ketones, ur >80 (A) NEGATIVE mg/dL   Protein, ur 30 (A) NEGATIVE mg/dL   Urobilinogen, UA 0.2 0.0 - 1.0 mg/dL   Nitrite NEGATIVE NEGATIVE   Leukocytes, UA NEGATIVE NEGATIVE  Hepatic function panel  Result Value Ref Range   Total Protein 7.4 6.0 - 8.3 g/dL   Albumin 4.2 3.5 - 5.2 g/dL   AST 19 0 - 37 U/L   ALT 14 0 - 35 U/L   Alkaline Phosphatase 61 39 - 117 U/L   Total Bilirubin 0.9 0.3 - 1.2 mg/dL   Bilirubin, Direct 0.2 0.0 - 0.5 mg/dL   Indirect Bilirubin 0.7 0.3 - 0.9 mg/dL  Lipase, blood  Result Value Ref Range   Lipase 18 11 - 59 U/L  Urine microscopic-add on  Result Value Ref Range   WBC, UA 0-2 <3 WBC/hpf   RBC / HPF 0-2 <3 RBC/hpf   Ct Abdomen Pelvis W Contrast 12/18/2014   CLINICAL DATA:  Lower abdominal pain with nausea and vomiting for 2 days  EXAM: CT ABDOMEN AND PELVIS WITH CONTRAST  TECHNIQUE: Multidetector CT imaging of the abdomen and pelvis was performed using the standard protocol following bolus administration of intravenous contrast.  CONTRAST:  182mL OMNIPAQUE IOHEXOL 300 MG/ML  SOLN  COMPARISON:  02/05/2011  FINDINGS: The lung bases are free of acute infiltrate or sizable effusion. A stable 6  mm nodule is noted in the left lower lobe posteriorly best seen on image number 6 of series 6. Given its stability over 4 years no further followup is recommended.  The the liver is fatty infiltrated. The gallbladder is decompressed. The spleen, adrenal glands and pancreas are within normal limits. The kidneys show a normal enhancement pattern. No renal calculi or obstructive changes are seen. Delayed images through the kidneys demonstrate normal excretion bilaterally. Diffuse aortoiliac calcifications are noted without aneurysmal dilatation. The appendix is well visualized and within normal limits. The bladder is partially distended. Sigmoid colon is decompressed. Minimal diverticular changes noted. No findings of diverticulitis are seen. No acute bony abnormality is seen.  IMPRESSION: Fatty liver.  Stable 6 mm nodule in the left lower lobe. No further followup is recommended.  No acute abnormality is noted.   Electronically Signed   By: Inez Catalina M.D.   On: 12/18/2014 14:32    1510:  Pt has tol PO well while in  the ED without N/V.  No stooling while in the ED.  Abd benign, VSS. Feels better and wants to go home now. Dx and testing d/w pt and family.  Questions answered.  Verb understanding, agreeable to d/c home with outpt f/u.    Francine Graven, DO 12/21/14 1552

## 2014-12-18 NOTE — ED Notes (Signed)
Pt c/o lower abd pain with n/v/d since Sunday.

## 2014-12-18 NOTE — ED Notes (Signed)
Patient would like something for pain and nausea.

## 2014-12-18 NOTE — Discharge Instructions (Signed)
°Emergency Department Resource Guide °1) Find a Doctor and Pay Out of Pocket °Although you won't have to find out who is covered by your insurance plan, it is a good idea to ask around and get recommendations. You will then need to call the office and see if the doctor you have chosen will accept you as a new patient and what types of options they offer for patients who are self-pay. Some doctors offer discounts or will set up payment plans for their patients who do not have insurance, but you will need to ask so you aren't surprised when you get to your appointment. ° °2) Contact Your Local Health Department °Not all health departments have doctors that can see patients for sick visits, but many do, so it is worth a call to see if yours does. If you don't know where your local health department is, you can check in your phone book. The CDC also has a tool to help you locate your state's health department, and many state websites also have listings of all of their local health departments. ° °3) Find a Walk-in Clinic °If your illness is not likely to be very severe or complicated, you may want to try a walk in clinic. These are popping up all over the country in pharmacies, drugstores, and shopping centers. They're usually staffed by nurse practitioners or physician assistants that have been trained to treat common illnesses and complaints. They're usually fairly quick and inexpensive. However, if you have serious medical issues or chronic medical problems, these are probably not your best option. ° °No Primary Care Doctor: °- Call [facility] at  832-8000 - they can help you locate a primary care doctor that  accepts your insurance, provides certain services, etc. °- Physician Referral Service- 1-800-533-3463 ° °Chronic Pain Problems: °Organization         Address  Phone   Notes  °[facility]  (336) 297-2271 Patients need to be referred by their primary care doctor.  ° °Medication  Assistance: °Organization         Address  Phone   Notes  °[place] [facility] [address] °[place], [place] [address] (336) 641-8030 --Must be a resident of [place] °-- Must have NO insurance coverage whatsoever (no Medicaid/ Medicare, etc.) °-- The pt. MUST have a primary care doctor that directs their care regularly and follows them in the community °  °[facility]  (866) 331-1348   °[employer]  (888) 892-1162   ° °Agencies that provide inexpensive medical care: °Organization         Address  Phone   Notes  °[facility]  (336) 832-8035   °[facility]    (336) 832-7272   °[facility] [address] °[place], [place] [address] (336) 832-4777   °[facility] [address], °[place] (336) 271-4999   °[facility]    (336) 373-0678   °[facility]    (336) 272-1050   °[facility] ° [address], [place] Phone:  (336) 832-4444, Fax:  (336) 832-4440 Hours of Operation:  9 am - 6 pm, M-F.  Also accepts Medicaid/Medicare and self-pay.  °[facility] ° [address], [place] Phone: (336) [phone], Fax: (336) [phone]. Hours of Operation:  8:30 am - 5:30 pm, M-F.  Also accepts Medicaid and self-pay.  °[facility] [place] [address], [place] Phone: (336) 878-6027   °[facility] [address], [place], [place] (336)723-1848, Ext. 123 Mondays & Thursdays: 7-9 AM.  First 15 patients are seen on a first come, first serve basis. °  ° °Medicaid-accepting [place] Providers: ° °Organization         Address  Phone   Notes  °[facility] [address], [place] (336) 641-2100 Also accepts self-pay patients.  °[facility] [address], [place] ° (336) 856-9996   °[facility] [address], [place]  (336) 288-8857   °[facility] [address] [place] [address], [place] (336) 299-7000   °Veita Bland [address], [place]  ° (336) 373-1557 Only accepts [place] Access Medicaid patients after they have their name applied to their card.  ° °Self-Pay (no insurance) in [place]: ° °Organization         Address  Phone   Notes  °Sickle Cell Patients, [facility] [address], [place] (336) 832-1970   °[facility] [address], [place] (336) 832-4400   °[facility] ° [address], [place] (336) 992-4800   °[facility]/Dr. Osei-Bonsu ° [address] [place] [address], [place] or [address], [place] (336) [phone] Phone number for both [place] and [place] locations is the same.  °[facility] [address], [place] (336) 299-0000   °[facility] [address] [place] [address], [place] or [address] (336) 852-7530 °(336) 878-2260   °[facility] [address], [place] (336) 350-1642, phone; (336) 294-5005, fax Sees patients 1st and 3rd Saturday of every month.  Must not qualify for public or private insurance (i.e. Medicaid, Medicare, [place] Health Choice, Veterans' Benefits) • Household income should be no more than 200% of the poverty level •The clinic cannot treat you if you are pregnant or think you are pregnant • Sexually transmitted diseases are not treated at the clinic.  ° ° °Dental Care: °Organization         Address  Phone  Notes  °[place] [facility] [address], [place] (336) 641-6152 Accepts children up to age 21 who are enrolled in Medicaid or [place] Health Choice; pregnant women with a Medicaid card; and children who have applied for Medicaid or [place] Health Choice, but were declined, whose parents can pay a reduced fee at time of service.  °[place]  [facility] [place]  [address], [place] (336) 641-7733 Accepts children up to age 21 who are enrolled in Medicaid or [place] Health Choice; pregnant women with a Medicaid card; and children who have applied for Medicaid or [place] Health Choice, but were declined, whose parents can pay a reduced fee at time of service.  °[facility] ° [address], [place] (336) 641-4533 Patients are seen by appointment only. Walk-ins are not accepted. [facility] will see patients 18 years of age and older. °Monday - Tuesday (8am-5pm) °Most Wednesdays (8:30-5pm) °$30 per visit, cash only  °[facility] ° [address], [place] (336) 641-4533 Patients are seen by appointment only. Walk-ins are not accepted. [facility] will see patients 18 years of age and older. °One   Wednesday Evening (Monthly: Volunteer Based).  $30 per visit, cash only  °[facility]  (919) 537-3737 for adults; Children under age 4, call Graduate Pediatric Dentistry at ([phone]) [phone]. Children aged 4-14, please call ([phone]) [phone] to request a pediatric application. ° Dental services are provided in all areas of dental care including fillings, crowns and bridges, complete and partial dentures, implants, gum treatment, root canals, and extractions. Preventive care is also provided. Treatment is provided to both adults and children. °Patients are selected via a lottery and there is often a waiting list. °  °[facility] [address], °[place] ° (336) 763-8833 www.drcivils.[email or web address] °  °[facility] [address], [place], [place] (336)723-1848, Ext. 123 Second and Fourth Thursday of each month, opens at 6:30 AM; Clinic ends at 9 AM.  Patients are seen on a first-come first-served basis, and a limited number are seen during each clinic.  ° °[facility] ° [address], [place], [place] (336) 723-7904    Eligibility Requirements °You must have lived in [place], or [place] counties for at least the last three months. °  You cannot be eligible for state or federal sponsored healthcare insurance, including Veterans Administration, Medicaid, or Medicare. °  You generally cannot be eligible for healthcare insurance through your employer.  °  How to apply: °Eligibility screenings are held every Tuesday and Wednesday afternoon from 1:00 pm until 4:00 pm. You do not need an appointment for the interview!  °[facility] [address], [place], [place] 336-631-2330   °[facility]  336-342-8273   °[facility]  336-703-3100   °[facility]  336-570-6415   ° °Behavioral Health Resources in the Community: °Intensive Outpatient Programs °Organization         Address  Phone  Notes  °[facility] [address], [place], [place] 336-878-6098   °[facility] [address], [place], [place] 336-832-9800   °[facility] [address], [place], [place] ° 336-882-2125   °[facility] [address],  °[place], [place] 1-800-853-5163 or 336-641-4981   °Substance Abuse Resources °Organization         Address  Phone  Notes  °[facility]  336-882-2125   °[facility]  336-784-9470   °[facility]  336-285-9073   °[facility]  336-845-3988   °Residential & Outpatient Substance Abuse Program  1-800-659-3381   °Psychological Services °Organization         Address  Phone  Notes  °[facility]  336- 832-9600   °[facility]  336- 378-7881   °[facility] [address], [place] 1-800-853-5163 or 336-641-4981   ° °Mobile Crisis Teams °Organization         Address  Phone  Notes  °[facility], Mobile Crisis Care Unit  1-877-626-1772   °Assertive °[facility] ° [address]  [place], [place] 336-834-9664   °Sharon DeEsch [address] °[place] 336-554-5454   ° °Self-Help/Support Groups °Organization         Address  Phone             Notes  °[facility] - variety of support groups  336- 373-1402 Call for more information  °Narcotics Anonymous (NA), [facility] [address], °[place]  2 meetings at this location  ° °  Residential Treatment Programs Organization         Address  Phone  Notes  [facility] [address],    [place]  1-8161849855   [facility]  [address] [place] [id number], [place], [place]   [place], [place] [phone] Admissions: 8am-3pm M-F  Incent[facility] Substance [place] [address],    [place], [place] [phone]   [facility] [address] [place], [place], [place]   [facility] [address]  [place], [place]   [facility] - Intensive Outpatient [address], [place], [place]   [facility] ([place]  [place], [place] 1-5016050912 or (737)301-9208   [facility] [address] [place], [place] Accepts Medicaid  [facility] [address]  [place] 1-973-820-2495 Substance Abuse/Addiction Treatment   [facility] Organization         Address  Phone  Notes  [facility]  (814)084-3558   Domenic Schwab, PhD [address] [place], [place]   919-044-6975 or [phone]   [facility] [place], [place] 682-623-6245   [facility] [address] [place], [place] 260-136-3997 Insurance/Medicaid/sponsorship through [facility] Families [address] [place] [address]                                    [place], [place] [phone] [place] [address], [place] [phone]    Dr. Adele Schilder  [phone]   [facility] Dept. 1) [address] [place] 2) [place] 3)  [address], [place] ([phone]) [phone]  [phone]   [place] [phone] or [phone] (After Hours)      Take the prescription as directed.  Increase your fluid intake (ie:  Gatoraide) for the next few days, as discussed.  Eat a bland diet and advance to your regular diet slowly as you can tolerate it.   Avoid full strength juices, as well as milk and milk products until your diarrhea has resolved.   Call your regular medical doctor today to schedule a follow up appointment in the next 2 days. Call the GI doctor today to schedule a follow up appointment within the next week.  Return to the Emergency Department immediately if not improving (or even worsening) despite taking the medicines as prescribed, any black or bloody stool or vomit, if you develop a fever over "101," or for any other concerns.

## 2014-12-25 ENCOUNTER — Ambulatory Visit: Payer: Medicare Other | Admitting: Family Medicine

## 2014-12-27 ENCOUNTER — Encounter: Payer: Self-pay | Admitting: Family Medicine

## 2015-01-03 ENCOUNTER — Ambulatory Visit (HOSPITAL_COMMUNITY)
Admission: RE | Admit: 2015-01-03 | Discharge: 2015-01-03 | Disposition: A | Discharge status: Home / Self Care | Payer: Medicare Other | Source: Ambulatory Visit | Attending: Cardiovascular Disease | Admitting: Cardiovascular Disease

## 2015-01-03 DIAGNOSIS — I4891 Unspecified atrial fibrillation: Secondary | ICD-10-CM

## 2015-01-03 MED ORDER — REGADENOSON 0.4 MG/5ML IV SOLN
0.4000 mg | Freq: Once | INTRAVENOUS | Status: AC
  Administered 2015-01-03: 0.4 mg via INTRAVENOUS

## 2015-01-03 MED ORDER — TECHNETIUM TC 99M SESTAMIBI GENERIC - CARDIOLITE
10.8000 | Freq: Once | INTRAVENOUS | Status: AC | PRN
  Administered 2015-01-03: 11 via INTRAVENOUS

## 2015-01-03 MED ORDER — AMINOPHYLLINE 25 MG/ML IV SOLN
75.0000 mg | Freq: Once | INTRAVENOUS | Status: AC
  Administered 2015-01-03: 75 mg via INTRAVENOUS

## 2015-01-03 MED ORDER — TECHNETIUM TC 99M SESTAMIBI GENERIC - CARDIOLITE
31.7000 | Freq: Once | INTRAVENOUS | Status: AC | PRN
  Administered 2015-01-03: 31.7 via INTRAVENOUS

## 2015-01-03 NOTE — Procedures (Addendum)
[place] [address] [place] [address] [id number]  Cardiology Nuclear Med Study  Mary [name] is a 64 y.o. female     MRN : [id number]     DOB: 03/01/1951  Procedure Date: 01/03/2015  Nuclear Med Background Indication for Stress Test:  Follow up CAD History:  Asthma, COPD and CAD;CABG X3-01/09/2014;Last NUC MPI on 05/17/2014-normal;EF=58%;AFIB;RBBB;CHF; Cardiac Risk Factors: Family History - CAD, History of Smoking, Hypertension, Lipids, NIDDM and RBBB  Symptoms:  Chest Pain, Dizziness, DOE, Fatigue, Light-Headedness, Nausea, Near Syncope, Palpitations, SOB and Vomiting   Nuclear Pre-Procedure Caffeine/Decaff Intake:  1:00am NPO After: 9:00am   IV Site: R Forearm  IV 0.9% NS with Angio Cath:  22g  Chest Size (in):  N/A IV Started by: Rolene Course, RN  Height: 5\' 7"  (1.702 m)  Cup Size: D  BMI:  Body mass index is 23.96 kg/(m^2). Weight:  153 lb (69.4 kg)   Tech Comments:  n/a    Nuclear Med Study 1 or 2 day study: 1 day  Stress Test Type:  [place] Provider:  Shelva Majestic, MD   Resting Radionuclide: Technetium 79m Sestamibi  Resting Radionuclide Dose: 10.8 mCi   Stress Radionuclide:  Technetium 62m Sestamibi  Stress Radionuclide Dose: 31.7 mCi           Stress Protocol Rest HR: 73 Stress HR: 71  Rest BP: 99/62 Stress BP: 101/70  Exercise Time (min): n/a METS: n/a          Dose of Adenosine (mg):  n/a Dose of Lexiscan: 0.4 mg  Dose of Atropine (mg): n/a Dose of Dobutamine: n/a mcg/kg/min (at max HR)  Stress Test Technologist: Leane Para, CCT Nuclear Technologist:Elizabeth Young,CNMT   Rest Procedure:  Myocardial perfusion imaging was performed at rest 45 minutes following the intravenous administration of Technetium 31m Sestamibi. Stress Procedure:  The patient received IV Lexiscan 0.4 mg over 15-seconds.  Technetium 64m Sestamibi injected IV at 30-seconds.  Patient  experienced marked SOB and 75 mg Aminophylline IV was administered.  There were no significant changes with Lexiscan.  Quantitative spect images were obtained after a 45 minute delay.  Transient Ischemic Dilatation (Normal <1.22):  1.35 QGS EDV:NA QGS ESV: NA LV Ejection Fraction: Study not gated     Rest ECG: NSR-RBBB  Stress ECG: No significant change from baseline ECG  QPS Raw Data Images:  Normal; no motion artifact; normal heart/lung ratio. Stress Images:  Normal homogeneous uptake in all areas of the myocardium. Rest Images:  Normal homogeneous uptake in all areas of the myocardium. Subtraction (SDS):  No evidence of ischemia.  Impression Exercise Capacity:  Lexiscan with no exercise. BP Response:  Normal blood pressure response. Clinical Symptoms:  No significant symptoms noted. ECG Impression:  No significant ST segment change suggestive of ischemia. Comparison with Prior Nuclear Study: No significant change from previous study  Overall Impression:  Normal stress nuclear study.  LV Wall Motion:  Not gated secondary to ectopy   Lorretta Harp, MD  01/03/2015 5:35 PM

## 2015-01-08 ENCOUNTER — Telehealth: Payer: Self-pay | Admitting: *Deleted

## 2015-01-08 NOTE — Telephone Encounter (Signed)
Spoke to patient. Result given . Verbalized understanding  

## 2015-01-08 NOTE — Telephone Encounter (Signed)
-----   Message from Troy Sine, MD sent at 01/05/2015  2:42 PM EST ----- Nl nuc

## 2015-01-14 ENCOUNTER — Ambulatory Visit: Payer: Medicare Other | Admitting: Cardiovascular Disease

## 2015-01-15 ENCOUNTER — Telehealth: Payer: Self-pay | Admitting: Family Medicine

## 2015-01-15 MED ORDER — OXYCODONE-ACETAMINOPHEN 7.5-325 MG PO TABS: 1.0000 | ORAL_TABLET | Freq: Two times a day (BID) | ORAL | Status: DC | PRN

## 2015-01-15 NOTE — Telephone Encounter (Signed)
Ok to refill??  Last office visit 11/23/2014.  Last refill 12/17/2014.

## 2015-01-15 NOTE — Telephone Encounter (Signed)
Prescription printed and patient made aware to come to office to pick up per VM.

## 2015-01-15 NOTE — Telephone Encounter (Signed)
Okay to refill? 

## 2015-01-15 NOTE — Telephone Encounter (Signed)
Patient is calling to get refill on her percocet and the only day she can pick it up is tomorrow  [phone]

## 2015-01-25 ENCOUNTER — Ambulatory Visit (INDEPENDENT_AMBULATORY_CARE_PROVIDER_SITE_OTHER): Payer: Medicare Other | Admitting: Family Medicine

## 2015-01-25 VITALS — BP 138/80 | HR 76 | Temp 98.2°F | Resp 16 | Ht 67.0 in | Wt 144.0 lb

## 2015-01-25 DIAGNOSIS — Z79899 Other long term (current) drug therapy: Secondary | ICD-10-CM

## 2015-01-25 DIAGNOSIS — J302 Other seasonal allergic rhinitis: Secondary | ICD-10-CM | POA: Diagnosis not present

## 2015-01-25 DIAGNOSIS — R55 Syncope and collapse: Secondary | ICD-10-CM | POA: Diagnosis not present

## 2015-01-25 DIAGNOSIS — E1141 Type 2 diabetes mellitus with diabetic mononeuropathy: Secondary | ICD-10-CM | POA: Diagnosis not present

## 2015-01-25 DIAGNOSIS — R9082 White matter disease, unspecified: Secondary | ICD-10-CM

## 2015-01-25 DIAGNOSIS — G8929 Other chronic pain: Secondary | ICD-10-CM

## 2015-01-25 DIAGNOSIS — R930 Abnormal findings on diagnostic imaging of skull and head, not elsewhere classified: Secondary | ICD-10-CM | POA: Diagnosis not present

## 2015-01-25 DIAGNOSIS — E785 Hyperlipidemia, unspecified: Secondary | ICD-10-CM | POA: Diagnosis not present

## 2015-01-25 DIAGNOSIS — I10 Essential (primary) hypertension: Secondary | ICD-10-CM | POA: Diagnosis not present

## 2015-01-25 MED ORDER — FLUTICASONE PROPIONATE 50 MCG/ACT NA SUSP: 2.0000 | Freq: Every day | NASAL | Status: DC

## 2015-01-25 NOTE — Assessment & Plan Note (Signed)
Recheck A1C goal is less than 7%

## 2015-01-25 NOTE — Progress Notes (Signed)
Patient ID: Mary [name], female   DOB: [birth year], 64 y.o.   MRN: [id number]   Subjective:    Patient ID: Mary [name], female    DOB: 1951/07/13, 64 y.o.   MRN: [id number]  Patient presents for F/U; ER F/U; and Weakness  Patient here to follow-up. She was seen in the ED secondary to what sounds like a viral gastroenteritis that has now resolved. She continues to complain of chronic pain actually every visit she comes in she complains of severe her pain is always has to have her pain medications increased as well as her Xanax. I have declined this multiple times that she has had episodes of altered mental status and of also received phone calls in the past regarding her use of medication. She declines going to a pain clinic stating that she cannot afford it. She states that she has had episodes where she almost passes out but cannot give me any details and she never went to the ER during these episodes though has been to the ER for other things. She states that she feels faint and weak all the time and is not associated with any chest pain or palpitations. She did have an MRI about a year ago after a syncopal event and altered mental status which showed nonspecific white matter changes but otherwise normal. She does not have any known carotid artery disease. Diabetes mellitus she states her fastings have been from 23-200, she is taking her medications as prescribed. She's not had any hypoglycemia symptoms  She's also had problems with her allergens in her sinuses since the pollen has come out. Her breathing has been good with her inhalers  Review Of Systems:  GEN- denies fatigue, fever, weight loss,weakness, recent illness HEENT- denies eye drainage, change in vision, +nasal discharge, CVS- denies chest pain, palpitations RESP- denies SOB, cough, wheeze ABD- denies N/V, change in stools, abd pain GU- denies dysuria, hematuria, dribbling, incontinence MSK-+ joint pain, muscle aches, injury Neuro-  + headache, dizziness, syncope, seizure activity       Objective:    BP 138/80 mmHg  Pulse 76  Temp(Src) 98.2 F (36.8 C) (Oral)  Resp 16  Ht 5\' 7"  (1.702 m)  Wt 144 lb (65.318 kg)  BMI 22.55 kg/m2 GEN- NAD, alert and oriented x3,walks with cane HEENT- PERRL, EOMI, non injected sclera, pink conjunctiva, MMM, oropharynx clear Neck- Supple, no thyromegaly, no bruit CVS- irregular rhythem, normal rate, no murmur RESP-CTAB ABD-NABS,soft,NT,ND NEURO-CNII-XII in tact, no deficits EXT- No edema Pulses- Radial 2+        Assessment & Plan:      Problem List Items Addressed This Visit    None      Note: This dictation was prepared with Dragon dictation along with smaller phrase technology. Any transcriptional errors that result from this process are unintentional.

## 2015-01-25 NOTE — Assessment & Plan Note (Signed)
Recheck lipids, goal LDL <100

## 2015-01-25 NOTE — Patient Instructions (Signed)
Continue current refill  We will call with blood work results Use the flonase as prescribed  MRI/for headaches-  Continue pain meds F/U in 4 months

## 2015-01-25 NOTE — Assessment & Plan Note (Signed)
Blood pressure well controlled

## 2015-01-25 NOTE — Assessment & Plan Note (Signed)
I have discussed I will not change meds, she declines pain clinic I did give offer of pain patch but she declined wants to take her"pills"

## 2015-01-25 NOTE — Assessment & Plan Note (Signed)
Trial of flonase

## 2015-01-26 LAB — HEMOGLOBIN A1C
Hgb A1c MFr Bld: 8.6 % — ABNORMAL HIGH (ref <5.7)
Mean Plasma Glucose: 200 mg/dL — ABNORMAL HIGH (ref <117)

## 2015-01-28 ENCOUNTER — Other Ambulatory Visit: Payer: Self-pay | Admitting: *Deleted

## 2015-01-28 MED ORDER — METFORMIN HCL 850 MG PO TABS: 850.0000 mg | ORAL_TABLET | Freq: Two times a day (BID) | ORAL | Status: DC

## 2015-02-01 LAB — BENZODIAZEPINES (GC/LC/MS), URINE
ALPRAZOLAMU: 278 ng/mL — AB (ref <25)
CLONAZEPAU: NEGATIVE ng/mL (ref <25)
Flurazepam metabolite (GC/LC/MS), ur confirm: NEGATIVE ng/mL (ref <50)
Lorazepam (GC/LC/MS), ur confirm: NEGATIVE ng/mL (ref <50)
Midazolam (GC/LC/MS), ur confirm: NEGATIVE ng/mL (ref <50)
Nordiazepam (GC/LC/MS), ur confirm: NEGATIVE ng/mL (ref <50)
OXAZEPAMU: NEGATIVE ng/mL (ref <50)
Temazepam (GC/LC/MS), ur confirm: NEGATIVE ng/mL (ref <50)
Triazolam metabolite (GC/LC/MS), ur confirm: NEGATIVE ng/mL (ref <50)

## 2015-02-01 LAB — PRESCRIPTION MONITORING PROFILE (13 PANEL)
Amphetamine/Meth: NEGATIVE ng/mL
BUPRENORPHINE, URINE: NEGATIVE ng/mL
Barbiturate Screen, Urine: NEGATIVE ng/mL
COCAINE METABOLITES: NEGATIVE ng/mL
Cannabinoid Scrn, Ur: NEGATIVE ng/mL
Creatinine, Urine: 23.66 mg/dL (ref >20.0)
Fentanyl, Ur: NEGATIVE ng/mL
Meperidine, Ur: NEGATIVE ng/mL
Methadone Screen, Urine: NEGATIVE ng/mL
Nitrites, Initial: NEGATIVE ug/mL
OPIATE SCREEN, URINE: NEGATIVE ng/mL
OXYCODONE SCRN UR: NEGATIVE ng/mL
Propoxyphene: NEGATIVE ng/mL
Tramadol Scrn, Ur: NEGATIVE ng/mL
pH, Initial: 5.2 pH (ref 4.5–8.9)

## 2015-02-04 ENCOUNTER — Ambulatory Visit (HOSPITAL_COMMUNITY)
Admission: RE | Admit: 2015-02-04 | Discharge: 2015-02-04 | Disposition: A | Discharge status: Home / Self Care | Payer: Medicare Other | Source: Ambulatory Visit | Attending: Family Medicine | Admitting: Family Medicine

## 2015-02-04 DIAGNOSIS — R9082 White matter disease, unspecified: Secondary | ICD-10-CM

## 2015-02-04 DIAGNOSIS — R55 Syncope and collapse: Secondary | ICD-10-CM

## 2015-02-05 ENCOUNTER — Encounter: Payer: Self-pay | Admitting: Family Medicine

## 2015-02-05 ENCOUNTER — Ambulatory Visit (INDEPENDENT_AMBULATORY_CARE_PROVIDER_SITE_OTHER): Payer: Medicare Other | Admitting: Family Medicine

## 2015-02-05 ENCOUNTER — Telehealth: Payer: Self-pay | Admitting: Family Medicine

## 2015-02-05 VITALS — BP 136/72 | HR 76 | Temp 98.1°F | Resp 12 | Ht 67.0 in | Wt 146.0 lb

## 2015-02-05 DIAGNOSIS — R892 Abnormal level of other drugs, medicaments and biological substances in specimens from other organs, systems and tissues: Secondary | ICD-10-CM | POA: Diagnosis not present

## 2015-02-05 DIAGNOSIS — G8929 Other chronic pain: Secondary | ICD-10-CM | POA: Diagnosis not present

## 2015-02-05 MED ORDER — OXYCODONE-ACETAMINOPHEN 7.5-325 MG PO TABS: 1.0000 | ORAL_TABLET | Freq: Two times a day (BID) | ORAL | Status: DC | PRN

## 2015-02-05 NOTE — Telephone Encounter (Signed)
Had to cancel MRI on patient, Have rescheduled.  Pt has an Atrial clip from heart surgery.  They could not find out what type of clip it was and therefore could not proceed with MRI due to possiblity of cardiac injury.  They were unable to find out this information.  I researched this and was able to find this information for them.  Information was relayed to them.  MRI, as states has been rescheduled for 02/11/15.

## 2015-02-05 NOTE — Patient Instructions (Signed)
F/U as previous Only 30 pain pills a day , must last 1 month

## 2015-02-05 NOTE — Assessment & Plan Note (Addendum)
She has chronic pain for legitimate reasons however concerned about her behavior and how she is using her medication. I'm not sure she she is holding bottles of medicine in her home and she does get her prescription filled every month and calls in like clockwork. I'm going to decrease her Percocet down to 30 tablets per month and will continue to randomly drug screen her. She can also use extra strength Tylenol when she is not using her Percocet. She still declines going to a pain clinic.   I gave first and tripling biotic ointment to use on the lesions where she had blood blister and it opened up, no signs of any cellulitis

## 2015-02-05 NOTE — Telephone Encounter (Signed)
noted 

## 2015-02-05 NOTE — Progress Notes (Signed)
Patient ID: Mary [name], female   DOB: 22-Oct-1951, 64 y.o.   MRN: [id number]   Subjective:    Patient ID: Mary [name], female    DOB: 1951-08-14, 64 y.o.   MRN: [id number]  Patient presents for Discuss Lab Results  issue here to discuss her lab results. Actually called her in Because of a failed drug screen. He can see my previous notes she is always ask for high doses of narcotics and I have declined this she states that she takes her medication every day until she came in today states that sometimes she skips days will only take a half a tablet when I told her that there were no opiates in her urine. Her Xanax was possible per prescribed. I asked her directly if she selling her medication or  Giving it  away to her family members and she denied this. She states that she locks it up at home and keeps it in case she needs it. She then asked if she could use anything over-the-counter to help her with her pain and she was to come off of her medication which is completely different from every other visit where she is asked for higher doses of medicine  She did ask for topical antibiotic cream she had an open scab where she had some bruising as she is on a blood thinner and the scab appeared, she's not had any drainage from lesions  Review Of Systems:  GEN- denies fatigue, fever, weight loss,weakness, recent illness HEENT- denies eye drainage, change in vision, nasal discharge, CVS- denies chest pain, palpitations RESP- denies SOB, cough, wheeze ABD- denies N/V, change in stools, abd pain GU- denies dysuria, hematuria, dribbling, incontinence MSK- +joint pain, muscle aches, injury Neuro- denies headache, dizziness, syncope, seizure activity       Objective:    BP 136/72 mmHg  Pulse 76  Temp(Src) 98.1 F (36.7 C) (Oral)  Resp 12  Ht 5\' 7"  (1.702 m)  Wt 146 lb (66.225 kg)  BMI 22.86 kg/m2 GEN- NAD, alert and oriented x3 Skin - 3 small scabs with mild erythema, mild ecchymosis bilat  arms      Assessment & Plan:      Problem List Items Addressed This Visit    Chronic pain - Primary   Relevant Medications   oxyCODONE-acetaminophen (PERCOCET) 7.5-325 MG per tablet    Other Visit Diagnoses    Abnormal drug screen        Benzos are correct in urine, she opiates were negative       Note: This dictation was prepared with Dragon dictation along with smaller phrase technology. Any transcriptional errors that result from this process are unintentional.

## 2015-02-11 ENCOUNTER — Ambulatory Visit (HOSPITAL_COMMUNITY): Admission: RE | Admit: 2015-02-11 | Payer: Medicare Other | Source: Ambulatory Visit

## 2015-02-15 ENCOUNTER — Other Ambulatory Visit: Payer: Self-pay | Admitting: Family Medicine

## 2015-02-15 ENCOUNTER — Telehealth: Payer: Self-pay | Admitting: Family Medicine

## 2015-02-15 NOTE — Telephone Encounter (Signed)
Noted, MRI Monday, I have received interesting calls like this before from her family.  She knows to go to ER for any confusion or problems

## 2015-02-15 NOTE — Telephone Encounter (Signed)
Medication refilled per protocol. 

## 2015-02-15 NOTE — Telephone Encounter (Signed)
I called patient home.  It was the only number I had.  Patient herself answered the phone.  Seemed very lucid.  Told her I was calling on your behalf to see how she was doing and remind her to keep MRI appt for Monday.  Said she was dong fine except for her usual aches and pains!!!

## 2015-02-15 NOTE — Telephone Encounter (Signed)
Sister calling.  Did not want to tell me who she was.  Due to HIPPA she did.  Says patient at home.  Is seeing demons and hearing dogs barking in her room.  Understands there is an issue with this patient and medications.  Says patient told her provider had cut her off of all her medications.  Says when she gets pain meds they are gone within days.  Also says in the last week she has taken an entire bottle of Ibuprofen and extra strength tylenol.  Sister is fearful for occupants of household to include small children.  Says there are weapons in the house.  I told her she needed to secure said weapons ASAP.  She also needs to call [phone] and have police and ambulance to home to take patient to ED for evaluation and treatment.  Patient obviously having a psychotic episode which could have many causes including her abuse of medications.  She fearful patient will say NO.  I told them do not ask her, call 911.  When they get there tell her they are here and she is going.  Do not argue with her or give her an option.  Sister was very fearful for herself and wanted to be sure her name did not come up due to fear of what sister may do.

## 2015-02-15 NOTE — Telephone Encounter (Signed)
Please call pt back and ensure pt has gone to ER to be seen, i dont see anything in the [facility] ER

## 2015-02-16 ENCOUNTER — Emergency Department (HOSPITAL_COMMUNITY): Payer: Medicare Other

## 2015-02-16 ENCOUNTER — Emergency Department (HOSPITAL_COMMUNITY)
Admission: EM | Admit: 2015-02-16 | Discharge: 2015-02-16 | Disposition: A | Discharge status: Home / Self Care | Payer: Medicare Other | Attending: Emergency Medicine | Admitting: Emergency Medicine

## 2015-02-16 ENCOUNTER — Encounter (HOSPITAL_COMMUNITY): Payer: Self-pay | Admitting: Emergency Medicine

## 2015-02-16 DIAGNOSIS — I509 Heart failure, unspecified: Secondary | ICD-10-CM | POA: Diagnosis not present

## 2015-02-16 DIAGNOSIS — E119 Type 2 diabetes mellitus without complications: Secondary | ICD-10-CM | POA: Insufficient documentation

## 2015-02-16 DIAGNOSIS — J441 Chronic obstructive pulmonary disease with (acute) exacerbation: Secondary | ICD-10-CM | POA: Diagnosis not present

## 2015-02-16 DIAGNOSIS — R0789 Other chest pain: Secondary | ICD-10-CM | POA: Diagnosis not present

## 2015-02-16 DIAGNOSIS — E785 Hyperlipidemia, unspecified: Secondary | ICD-10-CM | POA: Insufficient documentation

## 2015-02-16 DIAGNOSIS — Z7982 Long term (current) use of aspirin: Secondary | ICD-10-CM | POA: Diagnosis not present

## 2015-02-16 DIAGNOSIS — R197 Diarrhea, unspecified: Secondary | ICD-10-CM | POA: Diagnosis not present

## 2015-02-16 DIAGNOSIS — R110 Nausea: Secondary | ICD-10-CM | POA: Diagnosis not present

## 2015-02-16 DIAGNOSIS — G8929 Other chronic pain: Secondary | ICD-10-CM | POA: Insufficient documentation

## 2015-02-16 DIAGNOSIS — Z7951 Long term (current) use of inhaled steroids: Secondary | ICD-10-CM | POA: Insufficient documentation

## 2015-02-16 DIAGNOSIS — R079 Chest pain, unspecified: Secondary | ICD-10-CM | POA: Insufficient documentation

## 2015-02-16 DIAGNOSIS — Z91040 Latex allergy status: Secondary | ICD-10-CM | POA: Diagnosis not present

## 2015-02-16 DIAGNOSIS — Z79899 Other long term (current) drug therapy: Secondary | ICD-10-CM | POA: Insufficient documentation

## 2015-02-16 DIAGNOSIS — M1990 Unspecified osteoarthritis, unspecified site: Secondary | ICD-10-CM | POA: Insufficient documentation

## 2015-02-16 DIAGNOSIS — R112 Nausea with vomiting, unspecified: Secondary | ICD-10-CM | POA: Diagnosis not present

## 2015-02-16 DIAGNOSIS — Z87891 Personal history of nicotine dependence: Secondary | ICD-10-CM | POA: Insufficient documentation

## 2015-02-16 DIAGNOSIS — R0602 Shortness of breath: Secondary | ICD-10-CM | POA: Diagnosis not present

## 2015-02-16 DIAGNOSIS — F419 Anxiety disorder, unspecified: Secondary | ICD-10-CM | POA: Diagnosis not present

## 2015-02-16 DIAGNOSIS — R05 Cough: Secondary | ICD-10-CM | POA: Diagnosis not present

## 2015-02-16 DIAGNOSIS — I10 Essential (primary) hypertension: Secondary | ICD-10-CM | POA: Insufficient documentation

## 2015-02-16 DIAGNOSIS — I4891 Unspecified atrial fibrillation: Secondary | ICD-10-CM | POA: Insufficient documentation

## 2015-02-16 LAB — CBC
HCT: 35.5 % — ABNORMAL LOW (ref 36.0–46.0)
Hemoglobin: 11.9 g/dL — ABNORMAL LOW (ref 12.0–15.0)
MCH: 30.2 pg (ref 26.0–34.0)
MCHC: 33.5 g/dL (ref 30.0–36.0)
MCV: 90.1 fL (ref 78.0–100.0)
PLATELETS: 219 10*3/uL (ref 150–400)
RBC: 3.94 MIL/uL (ref 3.87–5.11)
RDW: 14.1 % (ref 11.5–15.5)
WBC: 13.8 10*3/uL — AB (ref 4.0–10.5)

## 2015-02-16 LAB — BASIC METABOLIC PANEL
ANION GAP: 12 (ref 5–15)
BUN: 8 mg/dL (ref 6–23)
CHLORIDE: 95 mmol/L — AB (ref 96–112)
CO2: 27 mmol/L (ref 19–32)
Calcium: 9.3 mg/dL (ref 8.4–10.5)
Creatinine, Ser: 0.7 mg/dL (ref 0.50–1.10)
GFR calc Af Amer: >90 mL/min (ref >90)
GFR, EST NON AFRICAN AMERICAN: 89 mL/min — AB (ref >90)
Glucose, Bld: 136 mg/dL — ABNORMAL HIGH (ref 70–99)
Potassium: 3.6 mmol/L (ref 3.5–5.1)
SODIUM: 134 mmol/L — AB (ref 135–145)

## 2015-02-16 LAB — DIGOXIN LEVEL: DIGOXIN LVL: 0.9 ng/mL (ref 0.8–2.0)

## 2015-02-16 LAB — RAPID URINE DRUG SCREEN, HOSP PERFORMED
AMPHETAMINES: NOT DETECTED
BARBITURATES: NOT DETECTED
BENZODIAZEPINES: NOT DETECTED
Cocaine: NOT DETECTED
Opiates: POSITIVE — AB
Tetrahydrocannabinol: NOT DETECTED

## 2015-02-16 LAB — URINALYSIS, ROUTINE W REFLEX MICROSCOPIC
Bilirubin Urine: NEGATIVE
GLUCOSE, UA: NEGATIVE mg/dL
HGB URINE DIPSTICK: NEGATIVE
Ketones, ur: NEGATIVE mg/dL
Leukocytes, UA: NEGATIVE
Nitrite: NEGATIVE
PH: 6 (ref 5.0–8.0)
PROTEIN: NEGATIVE mg/dL
Specific Gravity, Urine: 1.01 (ref 1.005–1.030)
Urobilinogen, UA: 0.2 mg/dL (ref 0.0–1.0)

## 2015-02-16 LAB — HEPATIC FUNCTION PANEL
ALBUMIN: 4.1 g/dL (ref 3.5–5.2)
ALT: 12 U/L (ref 0–35)
AST: 20 U/L (ref 0–37)
Alkaline Phosphatase: 54 U/L (ref 39–117)
BILIRUBIN TOTAL: 0.8 mg/dL (ref 0.3–1.2)
Bilirubin, Direct: 0.2 mg/dL (ref 0.0–0.5)
Indirect Bilirubin: 0.6 mg/dL (ref 0.3–0.9)
Total Protein: 7.2 g/dL (ref 6.0–8.3)

## 2015-02-16 LAB — TROPONIN I: Troponin I: <0.03 ng/mL (ref <0.031)

## 2015-02-16 LAB — I-STAT CG4 LACTIC ACID, ED: Lactic Acid, Venous: 2.28 mmol/L (ref 0.5–2.0)

## 2015-02-16 LAB — ACETAMINOPHEN LEVEL: Acetaminophen (Tylenol), Serum: <10 ug/mL — ABNORMAL LOW (ref 10–30)

## 2015-02-16 MED ORDER — PROMETHAZINE HCL 25 MG/ML IJ SOLN
12.5000 mg | Freq: Once | INTRAMUSCULAR | Status: AC
  Administered 2015-02-16: 12.5 mg via INTRAVENOUS
  Filled 2015-02-16: qty 1

## 2015-02-16 MED ORDER — PANTOPRAZOLE SODIUM 40 MG IV SOLR
40.0000 mg | Freq: Once | INTRAVENOUS | Status: AC
  Administered 2015-02-16: 40 mg via INTRAVENOUS
  Filled 2015-02-16: qty 40

## 2015-02-16 MED ORDER — SODIUM CHLORIDE 0.9 % IV BOLUS (SEPSIS)
500.0000 mL | Freq: Once | INTRAVENOUS | Status: AC
  Administered 2015-02-16: 500 mL via INTRAVENOUS

## 2015-02-16 MED ORDER — ONDANSETRON HCL 4 MG/2ML IJ SOLN
4.0000 mg | Freq: Once | INTRAMUSCULAR | Status: AC
  Administered 2015-02-16: 4 mg via INTRAVENOUS
  Filled 2015-02-16: qty 2

## 2015-02-16 MED ORDER — DIPHENOXYLATE-ATROPINE 2.5-0.025 MG PO TABS
2.0000 | ORAL_TABLET | Freq: Once | ORAL | Status: AC
  Administered 2015-02-16: 2 via ORAL
  Filled 2015-02-16: qty 2

## 2015-02-16 MED ORDER — MORPHINE SULFATE 4 MG/ML IJ SOLN
4.0000 mg | INTRAMUSCULAR | Status: DC | PRN
  Administered 2015-02-16: 4 mg via INTRAVENOUS
  Filled 2015-02-16: qty 1

## 2015-02-16 MED ORDER — ONDANSETRON 4 MG PO TBDP
4.0000 mg | ORAL_TABLET | Freq: Three times a day (TID) | ORAL | Status: DC | PRN
Start: 2015-02-16 — End: 2015-03-17

## 2015-02-16 MED ORDER — OMEPRAZOLE 20 MG PO CPDR: 20.0000 mg | DELAYED_RELEASE_CAPSULE | Freq: Two times a day (BID) | ORAL | Status: DC

## 2015-02-16 NOTE — ED Notes (Signed)
Becky Aranas-# [id number], sister in law- will be picking pt. Up if discharged.

## 2015-02-16 NOTE — ED Notes (Signed)
Patient states that she feels nauseated at this time. Asking for ice chips.

## 2015-02-16 NOTE — Discharge Instructions (Signed)

## 2015-02-16 NOTE — ED Provider Notes (Addendum)
CSN: [id number]     Arrival date & time 02/16/15  1623 History   First MD Initiated Contact with Patient 02/16/15 1626     Chief Complaint  Patient presents with  . Shortness of Breath      HPI  Patient presents via EMS with "I'm feeling sick". She states that she felt a little short of breath when the embolus came to get her and they put her on oxygen and a better. However, she states her symptoms have been "sick on my stomach" for the last 5 days. Describes nausea vomiting and diarrhea. Has not weak lightheaded or dizzy. Did not have chest pain or shortness of breath until in the ambulance. Straight diabetes. History of chronic pain and anxiety. On chronic narcotic prescription as well as Xanax from her primary care physician. History of permanent A. fib on digoxin and Pradaxa.  Past Medical History  Diagnosis Date  . Diabetes mellitus   . Arthritis   . Anxiety   . Chronic pain     Bacl pain, Disc L5-S1- Dr. Joya Salm in [place]  . Hyperlipidemia   . Hypertension   . Tachycardia   . Bronchial asthma   . Atrial fibrillation   . COPD (chronic obstructive pulmonary disease)   . DDD (degenerative disc disease)     Radicular symptoms  . Shortness of breath   . CHF (congestive heart failure)   . Hx of echocardiogram     Was interpreted by Dr Doylene Canard that showed an Ef in the 50-60% range with grade 1 diastolic dysfunction. she had moderate MR, biatrial enlargement, moderate TR and at that time estimated RV systolic pressure was 43 mm.  . History of stress test 06/2011    Abnormal myocardial perfusion study.   Past Surgical History  Procedure Laterality Date  . Carpal tunnel release      x 2  . Breast cyst removed    . Abdominal hysterectomy      partial  . Tubal ligation    . Tonsillectomy    . Coronary artery bypass graft N/A 01/09/2014    Procedure: CORONARY ARTERY BYPASS GRAFTING (CABG) x 3 using endoscopically harvested right saphenous vein and left internal mammary artery and  closure of left atrial appendage;  Surgeon: Ivin Poot, MD;  Location: [facility];  Service: Open Heart Surgery;  Laterality: N/A;  patient has preop IA BP   . Intraoperative transesophageal echocardiogram N/A 01/09/2014    Procedure: INTRAOPERATIVE TRANSESOPHAGEAL ECHOCARDIOGRAM;  Surgeon: Ivin Poot, MD;  Location: [facility];  Service: Open Heart Surgery;  Laterality: N/A;  . Left heart catheterization with coronary angiogram N/A 01/07/2014    Procedure: LEFT HEART CATHETERIZATION WITH CORONARY ANGIOGRAM;  Surgeon: Lorretta Harp, MD;  Location: [facility] CATH LAB;  Service: Cardiovascular;  Laterality: N/A;   Family History  Problem Relation Age of Onset  . Diabetes Mother   . Heart disease Mother   . Diabetes Sister   . Hypertension Sister   . Hyperlipidemia Sister   . Diabetes Brother   . Heart disease Brother   . Diabetes Brother   . Heart disease Brother    History  Substance Use Topics  . Smoking status: Former Smoker -- 1.50 packs/day for 30 years    Types: Cigarettes    Quit date: 05/26/2013  . Smokeless tobacco: Never Used     Comment: quit 6 months ago  . Alcohol Use: No   OB History    Gravida Para Term Preterm  AB TAB SAB Ectopic Multiple Living   1 1 1        0     Review of Systems  Constitutional: Negative for fever, chills, diaphoresis, appetite change and fatigue.  HENT: Negative for mouth sores, sore throat and trouble swallowing.   Eyes: Negative for visual disturbance.  Respiratory: Positive for shortness of breath. Negative for cough, chest tightness and wheezing.   Cardiovascular: Negative for chest pain.  Gastrointestinal: Positive for nausea, vomiting and diarrhea. Negative for abdominal pain and abdominal distention.  Endocrine: Negative for polydipsia, polyphagia and polyuria.  Genitourinary: Negative for dysuria, frequency and hematuria.  Musculoskeletal: Negative for gait problem.  Skin: Negative for color change, pallor and rash.  Neurological:  Negative for dizziness, syncope, light-headedness and headaches.  Hematological: Does not bruise/bleed easily.  Psychiatric/Behavioral: Negative for behavioral problems and confusion.      Allergies  Adhesive; Latex; and Zocor  Home Medications   Prior to Admission medications   Medication Sig Start Date End Date Taking? Authorizing Provider  acetaminophen (TYLENOL) 500 MG tablet Take 500 mg by mouth every 6 (six) hours as needed for mild pain.   Yes Historical Provider, MD  albuterol (PROAIR HFA) 108 (90 BASE) MCG/ACT inhaler Inhale 2 puffs into the lungs every 6 (six) hours as needed for wheezing or shortness of breath. 08/14/14  Yes Alycia Rossetti, MD  ALPRAZolam Duanne Moron) 1 MG tablet TAKE ONE TABLET BY MOUTH 2 TIMES A DAY AS NEEDED. 11/23/14  Yes Alycia Rossetti, MD  aspirin EC 81 MG tablet Take 81 mg by mouth daily.   Yes Historical Provider, MD  atorvastatin (LIPITOR) 40 MG tablet Take 1 tablet (40 mg total) by mouth daily. 10/04/14  Yes Troy Sine, MD  dabigatran (PRADAXA) 150 MG CAPS capsule Take 1 capsule (150 mg total) by mouth 2 (two) times daily. 09/14/14  Yes Troy Sine, MD  digoxin (LANOXIN) 0.125 MG tablet Take 1 tablet (0.125 mg total) by mouth daily. 09/04/14  Yes Troy Sine, MD  ferrous [id number] C-folic acid ([name] / FOLTRIN) capsule Take 1 capsule by mouth daily. 03/07/14  Yes Alycia Rossetti, MD  FLUoxetine (PROZAC) 20 MG capsule TAKE (1) CAPSULE BY MOUTH EVERY DAY.TAKE WITH 40MG . 02/15/15  Yes Alycia Rossetti, MD  FLUoxetine (PROZAC) 20 MG tablet Take 1 tablet (20 mg total) by mouth daily. Take 60mg  once a day for mood 10/02/14  Yes Alycia Rossetti, MD  FLUoxetine (PROZAC) 40 MG capsule Take 1 tablet with 20mg  tablet of prozac once a day Patient taking differently: Take 60 mg by mouth daily. Take 1 tablet with 20mg  tablet of prozac once a day 10/02/14  Yes Alycia Rossetti, MD  fluticasone [facility]) 50 MCG/ACT nasal spray Place 2 sprays into  both nostrils daily. 01/25/15  Yes Alycia Rossetti, MD  furosemide (LASIX) 40 MG tablet Take 0.5 tablets (20 mg total) by mouth daily. Take daily. 09/04/14  Yes Troy Sine, MD  JANUVIA 100 MG tablet TAKE ONE TABLET BY MOUTH DAILY. 08/14/14  Yes Alycia Rossetti, MD  ketoconazole (NIZORAL) 2 % shampoo Apply 1 application topically 2 (two) times a week. 08/14/14  Yes Alycia Rossetti, MD  lisinopril (PRINIVIL,ZESTRIL) 2.5 MG tablet Take 1 tablet (2.5 mg total) by mouth daily. 10/15/14  Yes Troy Sine, MD  metFORMIN (GLUCOPHAGE) 850 MG tablet Take 1 tablet (850 mg total) by mouth 2 (two) times daily with a meal. 01/28/15  Yes Alycia Rossetti,  MD  metoprolol (LOPRESSOR) 50 MG tablet Take 1.5 tablets (75 mg total) by mouth 2 (two) times daily. 10/04/14  Yes Troy Sine, MD  oxyCODONE-acetaminophen (PERCOCET) 7.5-325 MG per tablet Take 1 tablet by mouth 2 (two) times daily as needed. 02/05/15  Yes Alycia Rossetti, MD  potassium chloride SA (K-DUR,KLOR-CON) 20 MEQ tablet TAKE 1 TABLET BY MOUTH ONCE DAILY. TAKE WITH FUROSEMIDE. 08/27/14  Yes Alycia Rossetti, MD  sodium chloride (OCEAN) 0.65 % nasal spray Place 1 spray into the nose as needed for congestion. Congestion   Yes Historical Provider, MD  SYMBICORT 160-4.5 MCG/ACT inhaler INHALE 2 PUFFS INTO THE LUNGS TWICE DAILY. RINSE MOUTH AFTER USE. 12/17/14  Yes Alycia Rossetti, MD  ondansetron (ZOFRAN) 4 MG tablet Take 1 tablet (4 mg total) by mouth every 8 (eight) hours as needed for nausea or vomiting. Patient not taking: Reported on 02/16/2015 12/18/14   Francine Graven, DO  promethazine (PHENERGAN) 25 MG tablet Take 1 tablet (25 mg total) by mouth every 6 (six) hours as needed for nausea. Patient not taking: Reported on 02/16/2015 10/21/14   Veryl Speak, MD  thiamine 100 MG tablet Take 1 tablet (100 mg total) by mouth daily. Patient not taking: Reported on 02/16/2015 10/27/13   Nita Sells, MD   BP 154/81 mmHg  Pulse 77  Temp(Src) 98.6 F  (37 C) (Oral)  Resp 18  Ht 5\' 7"  (1.702 m)  Wt 142 lb (64.411 kg)  BMI 22.24 kg/m2  SpO2 97% Physical Exam  Constitutional: She is oriented to person, place, and time. She appears well-developed and well-nourished. No distress.  HENT:  Head: Normocephalic.  Eyes: Conjunctivae are normal. Pupils are equal, round, and reactive to light. No scleral icterus.  Neck: Normal range of motion. Neck supple. No thyromegaly present.  Cardiovascular: Normal rate and regular rhythm.  Exam reveals no gallop and no friction rub.   No murmur heard. Pulmonary/Chest: Effort normal and breath sounds normal. No respiratory distress. She has no wheezes. She has no rales.  Clear bilateral breath sounds. No wheezing rales rhonchi.  Abdominal: Soft. Bowel sounds are normal. She exhibits no distension. There is no tenderness. There is no rebound.  Hypoactive, but present bowel sounds. No high-pitched rushes. No guarding rebound or peritoneal irritation.  Musculoskeletal: Normal range of motion.  Neurological: She is alert and oriented to person, place, and time.  Skin: Skin is warm and dry. No rash noted.  Psychiatric: She has a normal mood and affect. Her behavior is normal.    ED Course  Procedures (including critical care time) Labs Review Labs Reviewed  CBC - Abnormal; Notable for the following:    WBC 13.8 (*)    Hemoglobin 11.9 (*)    HCT 35.5 (*)    All other components within normal limits  BASIC METABOLIC PANEL - Abnormal; Notable for the following:    Sodium 134 (*)    Chloride 95 (*)    Glucose, Bld 136 (*)    GFR calc non Af Amer 89 (*)    All other components within normal limits  URINE RAPID DRUG SCREEN (HOSP PERFORMED) - Abnormal; Notable for the following:    Opiates POSITIVE (*)    All other components within normal limits  ACETAMINOPHEN LEVEL - Abnormal; Notable for the following:    Acetaminophen (Tylenol), Serum <10.0 (*)    All other components within normal limits  I-STAT  CG4 LACTIC ACID, ED - Abnormal; Notable for the following:  Lactic Acid, Venous 2.28 (*)    All other components within normal limits  TROPONIN I  DIGOXIN LEVEL  URINALYSIS, ROUTINE W REFLEX MICROSCOPIC    Imaging Review Dg Chest Port 1 View  02/16/2015   CLINICAL DATA:  Chest pain with cough and vomiting including diarrhea since yesterday.  EXAM: PORTABLE CHEST - 1 VIEW  COMPARISON:  11/19/2014.  FINDINGS: Prior CABG. Cardiac enlargement. Loop recorder. No active infiltrates or failure. No pneumothorax or effusion. Unremarkable osseous structures.  IMPRESSION: Stable chest.  Cardiomegaly.  No active infiltrates or failure.   Electronically Signed   By: Rolla Flatten M.D.   On: 02/16/2015 18:31     EKG Interpretation   Date/Time:  Saturday February 16 2015 16:27:52 EDT Ventricular Rate:  76 PR Interval:  187 QRS Duration: 135 QT Interval:  388 QTC Calculation: 436 R Axis:   76 Text Interpretation:  Sinus rhythm Right bundle branch block Confirmed by  Jeneen Rinks  MD, [place] ([id number]) on 02/16/2015 5:54:29 PM      MDM   Final diagnoses:  Chest pain    On recheck patient has had no vomiting and has taken some by mouth liquids. She continues to state "I'm so sick on my stomach". Discussed reassuring studies. Does have WC 13.8. Lactate 2.2. Normal BUN and creatinine. Sodium 134. Urine positive for opiates negative for benzodiazepines consistent with her recent primary care physician's notes. Urine does not appear infected. Acetaminophen level  Unappreciable.  Hepatic enzymes pending.  I did review her most recent office notes. At one point a sister had called in concerned that she felt that the patient might be "seeing things or hearing things". I discussed this with patient she denies hallucinations. Her thought processes with me here at goal oriented, and without concern. She is not tangential or confused.  She describes changes in her diabetic medications from metformin to Invokana a few  months ago. She describes again a feeling of "sick on my stomach" intermittent for the last few months. States that this got better when she was put back on her metformin a few weeks ago. But continues to describe these episodes.  No vomiting here. I did review her echo which shows 55% EF. Was given 500 mL IV fluid 2. Has urinated here. Urine is not concentrated. She has not azotemic or clinically dehydrated. Benign abdomen. I think she will be appropriate for outpatient treatment. Have asked her to discuss GI referral with her physician. This may be some gastroparesis. Asked her to do small frequent meals. Clear liquids today and slowly advancing her diet. Zofran prescription. Proton pump inhibitor.   Tanna Furry, MD 02/16/15 Darlin Drop  Tanna Furry, MD 02/16/15 2022

## 2015-02-16 NOTE — ED Notes (Signed)
Pt c/o generally not feeling well x 1 week with increasing sob x 2 days and cp today

## 2015-02-17 ENCOUNTER — Emergency Department (HOSPITAL_COMMUNITY)
Admission: EM | Admit: 2015-02-17 | Discharge: 2015-02-17 | Disposition: A | Discharge status: Home / Self Care | Payer: Medicare Other | Attending: Emergency Medicine | Admitting: Emergency Medicine

## 2015-02-17 DIAGNOSIS — E785 Hyperlipidemia, unspecified: Secondary | ICD-10-CM | POA: Insufficient documentation

## 2015-02-17 DIAGNOSIS — J449 Chronic obstructive pulmonary disease, unspecified: Secondary | ICD-10-CM | POA: Diagnosis not present

## 2015-02-17 DIAGNOSIS — I10 Essential (primary) hypertension: Secondary | ICD-10-CM | POA: Diagnosis not present

## 2015-02-17 DIAGNOSIS — F419 Anxiety disorder, unspecified: Secondary | ICD-10-CM | POA: Insufficient documentation

## 2015-02-17 DIAGNOSIS — K529 Noninfective gastroenteritis and colitis, unspecified: Secondary | ICD-10-CM | POA: Diagnosis not present

## 2015-02-17 DIAGNOSIS — E119 Type 2 diabetes mellitus without complications: Secondary | ICD-10-CM | POA: Diagnosis not present

## 2015-02-17 DIAGNOSIS — Z7982 Long term (current) use of aspirin: Secondary | ICD-10-CM | POA: Diagnosis not present

## 2015-02-17 DIAGNOSIS — M1990 Unspecified osteoarthritis, unspecified site: Secondary | ICD-10-CM | POA: Diagnosis not present

## 2015-02-17 DIAGNOSIS — Z7951 Long term (current) use of inhaled steroids: Secondary | ICD-10-CM | POA: Diagnosis not present

## 2015-02-17 DIAGNOSIS — Z79899 Other long term (current) drug therapy: Secondary | ICD-10-CM | POA: Insufficient documentation

## 2015-02-17 DIAGNOSIS — Z91040 Latex allergy status: Secondary | ICD-10-CM | POA: Diagnosis not present

## 2015-02-17 DIAGNOSIS — G8929 Other chronic pain: Secondary | ICD-10-CM | POA: Diagnosis not present

## 2015-02-17 DIAGNOSIS — R404 Transient alteration of awareness: Secondary | ICD-10-CM | POA: Diagnosis not present

## 2015-02-17 DIAGNOSIS — I509 Heart failure, unspecified: Secondary | ICD-10-CM | POA: Diagnosis not present

## 2015-02-17 DIAGNOSIS — E860 Dehydration: Secondary | ICD-10-CM | POA: Diagnosis not present

## 2015-02-17 DIAGNOSIS — R531 Weakness: Secondary | ICD-10-CM | POA: Diagnosis not present

## 2015-02-17 DIAGNOSIS — Z87891 Personal history of nicotine dependence: Secondary | ICD-10-CM | POA: Diagnosis not present

## 2015-02-17 DIAGNOSIS — R1110 Vomiting, unspecified: Secondary | ICD-10-CM | POA: Diagnosis present

## 2015-02-17 LAB — CBC WITH DIFFERENTIAL/PLATELET
Basophils Absolute: 0 10*3/uL (ref 0.0–0.1)
Basophils Relative: 0 % (ref 0–1)
EOS ABS: 0.1 10*3/uL (ref 0.0–0.7)
Eosinophils Relative: 1 % (ref 0–5)
HCT: 36.8 % (ref 36.0–46.0)
HEMOGLOBIN: 11.9 g/dL — AB (ref 12.0–15.0)
Lymphocytes Relative: 13 % (ref 12–46)
Lymphs Abs: 1.8 10*3/uL (ref 0.7–4.0)
MCH: 29.6 pg (ref 26.0–34.0)
MCHC: 32.3 g/dL (ref 30.0–36.0)
MCV: 91.5 fL (ref 78.0–100.0)
MONOS PCT: 8 % (ref 3–12)
Monocytes Absolute: 1.1 10*3/uL — ABNORMAL HIGH (ref 0.1–1.0)
NEUTROS ABS: 10.3 10*3/uL — AB (ref 1.7–7.7)
Neutrophils Relative %: 78 % — ABNORMAL HIGH (ref 43–77)
Platelets: 207 10*3/uL (ref 150–400)
RBC: 4.02 MIL/uL (ref 3.87–5.11)
RDW: 14.4 % (ref 11.5–15.5)
WBC: 13.4 10*3/uL — ABNORMAL HIGH (ref 4.0–10.5)

## 2015-02-17 LAB — COMPREHENSIVE METABOLIC PANEL
ALT: 12 U/L (ref 0–35)
ANION GAP: 11 (ref 5–15)
AST: 18 U/L (ref 0–37)
Albumin: 4 g/dL (ref 3.5–5.2)
Alkaline Phosphatase: 49 U/L (ref 39–117)
BUN: 8 mg/dL (ref 6–23)
CO2: 25 mmol/L (ref 19–32)
CREATININE: 0.6 mg/dL (ref 0.50–1.10)
Calcium: 9.1 mg/dL (ref 8.4–10.5)
Chloride: 100 mmol/L (ref 96–112)
GFR calc Af Amer: >90 mL/min (ref >90)
GFR calc non Af Amer: >90 mL/min (ref >90)
GLUCOSE: 137 mg/dL — AB (ref 70–99)
Potassium: 3.2 mmol/L — ABNORMAL LOW (ref 3.5–5.1)
Sodium: 136 mmol/L (ref 135–145)
Total Bilirubin: 0.6 mg/dL (ref 0.3–1.2)
Total Protein: 7.1 g/dL (ref 6.0–8.3)

## 2015-02-17 LAB — URINALYSIS, ROUTINE W REFLEX MICROSCOPIC
Bilirubin Urine: NEGATIVE
Glucose, UA: NEGATIVE mg/dL
Hgb urine dipstick: NEGATIVE
Ketones, ur: NEGATIVE mg/dL
Leukocytes, UA: NEGATIVE
Nitrite: NEGATIVE
Protein, ur: NEGATIVE mg/dL
SPECIFIC GRAVITY, URINE: 1.025 (ref 1.005–1.030)
Urobilinogen, UA: 0.2 mg/dL (ref 0.0–1.0)
pH: 6 (ref 5.0–8.0)

## 2015-02-17 LAB — LIPASE, BLOOD: Lipase: 32 U/L (ref 11–59)

## 2015-02-17 MED ORDER — ONDANSETRON HCL 4 MG/2ML IJ SOLN
4.0000 mg | Freq: Once | INTRAMUSCULAR | Status: AC
Start: 2015-02-17 — End: 2015-02-17
  Administered 2015-02-17: 4 mg via INTRAVENOUS
  Filled 2015-02-17: qty 2

## 2015-02-17 MED ORDER — SODIUM CHLORIDE 0.9 % IV BOLUS (SEPSIS)
1000.0000 mL | Freq: Once | INTRAVENOUS | Status: AC
  Administered 2015-02-17: 1000 mL via INTRAVENOUS

## 2015-02-17 NOTE — ED Notes (Signed)
Patient was transported by wheelchair by staff for granddaughter to pick her up at emergency entrance.

## 2015-02-17 NOTE — ED Notes (Signed)
PT SEEN HERE YESTERDAY FOR SAME THING- sTATES THAT SHE HAS BEEN HAVING NAUSEA /DIARRHEA AND DRY HEAVING. FEELS WEAK

## 2015-02-17 NOTE — ED Notes (Signed)
Called patient's sister in law to inform her that she was being discharge and needed to be picked up, she said should would try to find someone and call me back.

## 2015-02-17 NOTE — Discharge Instructions (Signed)
Increase fluids.  Tylenol for fever.  Rest.  Continue your potassium medication

## 2015-02-17 NOTE — ED Notes (Signed)
Mary [name] sister in law [phone].

## 2015-02-18 ENCOUNTER — Ambulatory Visit (HOSPITAL_COMMUNITY): Payer: Medicare Other

## 2015-02-18 ENCOUNTER — Telehealth: Payer: Self-pay | Admitting: Family Medicine

## 2015-02-18 NOTE — Telephone Encounter (Signed)
Pt needs to discuss with me personally not family members

## 2015-02-18 NOTE — Telephone Encounter (Signed)
Patients sister calling to see if there is any way that dr Buelah Manis can put in order for in home care would like to speak to nurse regarding this  [phone]

## 2015-02-18 NOTE — Telephone Encounter (Signed)
MD please advise

## 2015-02-19 ENCOUNTER — Telehealth: Payer: Self-pay | Admitting: Family Medicine

## 2015-02-19 ENCOUNTER — Other Ambulatory Visit: Payer: Self-pay | Admitting: Family Medicine

## 2015-02-19 NOTE — Telephone Encounter (Signed)
Spoke to patient.  Sounded very weak.  Says is not well.  Stressed to her to keep appt tomorrow.  She said she would try.  Says something funny going on in head.  Asked why she canceled MRI then.  Because not feeling well.  Told her then even more important she keep appt tomorrow.

## 2015-02-19 NOTE — Telephone Encounter (Signed)
Ok to refill??  Last office visit 02/05/2015.  Last refill 11/23/2014, #2 refills.

## 2015-02-19 NOTE — Telephone Encounter (Signed)
Call pt I will not refill her xanax unless she comes in for appt tomorrow, I have many concerning phone calls about her we need to discuss

## 2015-02-19 NOTE — Telephone Encounter (Signed)
Will discuss with pt, seen in ED UDS showed no other drugs Concerning with multiple family members keep calling office.

## 2015-02-19 NOTE — Telephone Encounter (Signed)
Patient has appointment scheduled on 02/20/2015.

## 2015-02-19 NOTE — Telephone Encounter (Signed)
Sister and sister in law calling again.  Pt again is acting CRAZY.  Seeing things,people, animals in house.  Was to ED the other day.  Has appt here tomorrow.  She canceled her MRI on Monday!  Family feels she is definitely abusing medications and not taking her regular medications.  They say she has a "friend" who visits her on a regular basis and she gets more crazy after they have been there.  They feel they are bringing "drugs" into the house to her.  Again they are fearful and fearful of what she might do if she knew they were calling us.  I told them I would try to call her this afternoon and remind her to keep appt with provider tomorrow.  It is important for her to keep appt.

## 2015-02-19 NOTE — Telephone Encounter (Signed)
Call placed to patient sister, Jacqlyn Larsen to make aware.   Advised that patient would need to discuss with office need for [facility].   Tentative appointment scheduled.

## 2015-02-20 ENCOUNTER — Ambulatory Visit (INDEPENDENT_AMBULATORY_CARE_PROVIDER_SITE_OTHER): Payer: Medicare Other | Admitting: Family Medicine

## 2015-02-20 ENCOUNTER — Encounter: Payer: Self-pay | Admitting: Family Medicine

## 2015-02-20 VITALS — BP 136/88 | HR 92 | Temp 98.7°F | Resp 16 | Ht 67.0 in | Wt 144.0 lb

## 2015-02-20 DIAGNOSIS — K5289 Other specified noninfective gastroenteritis and colitis: Secondary | ICD-10-CM

## 2015-02-20 DIAGNOSIS — E860 Dehydration: Secondary | ICD-10-CM

## 2015-02-20 DIAGNOSIS — R531 Weakness: Secondary | ICD-10-CM | POA: Diagnosis not present

## 2015-02-20 DIAGNOSIS — E876 Hypokalemia: Secondary | ICD-10-CM

## 2015-02-20 LAB — CBC W/MCH & 3 PART DIFF
HCT: 33.5 % — ABNORMAL LOW (ref 36.0–46.0)
Hemoglobin: 11.1 g/dL — ABNORMAL LOW (ref 12.0–15.0)
LYMPHS PCT: 16 % (ref 12–46)
Lymphs Abs: 1.9 10*3/uL (ref 0.7–4.0)
MCH: 30.6 pg (ref 26.0–34.0)
MCHC: 33.1 g/dL (ref 30.0–36.0)
MCV: 92.3 fL (ref 78.0–100.0)
Neutro Abs: 9.5 10*3/uL — ABNORMAL HIGH (ref 1.7–7.7)
Neutrophils Relative %: 79 % — ABNORMAL HIGH (ref 43–77)
Platelets: 206 10*3/uL (ref 150–400)
RBC: 3.63 MIL/uL — AB (ref 3.87–5.11)
RDW: 15.1 % (ref 11.5–15.5)
WBC: 12 10*3/uL — AB (ref 4.0–10.5)
WBCMIX: 0.6 10*3/uL (ref 0.1–1.8)
WBCMIXPER: 5 % (ref 3–18)

## 2015-02-20 MED ORDER — SODIUM CHLORIDE 0.9 % IV SOLN
Freq: Once | INTRAVENOUS | Status: AC
  Administered 2015-02-20: 500 mL via INTRAVENOUS

## 2015-02-20 NOTE — Patient Instructions (Addendum)
MRI will be rescheduled for next week We will call with lab results Physical therapy to be done [facility] referral  F/U as previous

## 2015-02-20 NOTE — Progress Notes (Signed)
Patient ID: Mary [name], female   DOB: 10/19/1951, 64 y.o.   MRN: [id number]   Subjective:    Patient ID: Mary [name], female    DOB: [birth year], 64 y.o.   MRN: [id number]  Patient presents for ER F/U- Weakness      Patient here to follow-up from the emergency room. On Saturday she began having some nausea and vomiting she went to the ER was diagnosed a viral illness she returned the next day the nausea improved with antiemetics however she began having a lot of diarrhea. She was advised that this was gastroenteritis and that he would need to run its course. She was given IV fluids on both days. Her diarrhea has slowed down significantly she's not had any vomiting since this weekend but she still feels very weak. She's been taking all her medications as prescribed with the exception of her Pradaxa as this is required prior authorization.  there is been a lot of phone calls back and forth from her family members with concern about her mental status. There is also concern that she was doing some street drugs in concern that she was taking a lot of her pain medication in her benzos. Of note during her evaluation in the emergency room there was not noted any change to her orientation and her urine drug screen was positive for opiates but not positive for benzodiazepines.   her sister as well as her sister and Niece came to the  office demanding to speak to me without the patient knowing. The patient  Not want me to speak with her sister in law Mary [name] and at the end of the visit states that could speak with her sister [name] DO NOT REALLY HELP HER THEY'RE WORRIED THAT SHE IS GOING TO [name]. SHE WANTS TO HAVE PHYSICAL THERAPY TO HELP GET HER STRENGTH BACK AND HELP HER WITH HER WALKING.   Review Of Systems:  GEN- denies fatigue, fever, weight loss,+weakness, recent illness HEENT- denies eye drainage, change in  vision, nasal discharge, CVS- denies chest pain, palpitations RESP- denies SOB, cough, wheeze ABD- + N/V, +change in stools, +abd pain GU- denies dysuria, hematuria, dribbling, incontinence MSK- + joint pain, muscle aches, injury Neuro- denies headache, dizziness, syncope, seizure activity       Objective:    BP 136/88 mmHg  Pulse 92  Temp(Src) 98.7 F (37.1 C) (Oral)  Resp 16  Ht 5\' 7"  (1.702 m)  Wt 144 lb (65.318 kg)  BMI 22.55 kg/m2 GEN- NAD, alert and oriented x3,weak appearing HEENT- PERRL, EOMI, non injected sclera, pink conjunctiva, MMM, oropharynx clear Neck- Supple, no LAD CVS- irregular rhythem, normal rate, no murmur RESP-CTAB ABD-NABS,soft,mild TTP difusely,no rebound, no guarding NEURO-CNII-XII in tact,  EXT- No edema Pulses- Radial 2+      Assessment & Plan:     It distally I have received multiple phone calls from the patient's family stating that she was losing her mind.  I spoke with her sister Mary [name] today here in the office. She states that Mary [name] has a female friend that comes over and often when she wheezes when she starts acting crazy out of her head she thinks that she has given her some type of drug but she does not know what it is. She states that Mary [name] often sits back in her room is taking a lot of pain medication  and she takes more in the day to what is prescribed. She states that they tried to help her but she always gets upset. They wanted to know she could be put in an assisted living or nursing facility but she does not have the finances to do so. When I spoke with Mary [name] about the phone calls that were coming and regarding her behavior at home she denies that she is taking any street drugs her story Changing regarding whether or not she had her pain medicine and her Xanax dose she was just in here 2 weeks ago with a failed drug screen. Her drug screen at the ER did show opiates but the benzodiazepines were negative. She did not get her MRI states that  she did not feel very well then started rambling on about sure having a lot of headaches and dizzy spells,  And I advised her that to help [place] figure out was going on she needs to have her MRI done she states that she will go ahead and have this done next week. This is a very difficult situation I have history o  problems with the family calling and making accusations before which of course do not add up to what the patient says. I may get tried healthcare network to get into the home as well as a Education officer, museum so we can see more was going on. Her MRI of brain will be rescheduled. I did decrease her narcotic medication 2 weeks ago I also refuse to give her any more benzodiazepines to what she is already on which honestly am not sure if she is truly taking on a regular basis. She is not of harm to herself or others based on todays visit.    - [facility], HHPT, SW   Problem List Items Addressed This Visit    None    Visit Diagnoses    Hypokalemia    -  Primary    Recheck the potassium     Relevant Medications    0.9 %  sodium chloride infusion (Completed)    Other Relevant Orders    Comprehensive metabolic panel    CBC w/MCH & 3 Part Diff (Completed)    Other noninfectious gastroenteritis        Improving already, minimal diarrhea, no further emesis, abd exam benign, WBC improving, given 500CC bolus in office and responded well     Relevant Orders    Comprehensive metabolic panel    Mild dehydration        NS bolus given in office, tolerated well    Generalized weakness        HH PT, [facility] referral       Note: This dictation was prepared with Dragon dictation along with smaller phrase technology. Any transcriptional errors that result from this process are unintentional.

## 2015-02-20 NOTE — Telephone Encounter (Signed)
Patient seen in office.   MD approved refill.   Medication called to pharmacy.

## 2015-02-21 ENCOUNTER — Other Ambulatory Visit: Payer: Self-pay | Admitting: *Deleted

## 2015-02-21 DIAGNOSIS — I10 Essential (primary) hypertension: Secondary | ICD-10-CM

## 2015-02-21 DIAGNOSIS — E1141 Type 2 diabetes mellitus with diabetic mononeuropathy: Secondary | ICD-10-CM

## 2015-02-21 DIAGNOSIS — G8929 Other chronic pain: Secondary | ICD-10-CM

## 2015-02-21 DIAGNOSIS — R531 Weakness: Secondary | ICD-10-CM

## 2015-02-21 LAB — COMPREHENSIVE METABOLIC PANEL
ALT: 8 U/L (ref 0–35)
AST: 12 U/L (ref 0–37)
Albumin: 3.7 g/dL (ref 3.5–5.2)
Alkaline Phosphatase: 47 U/L (ref 39–117)
BUN: 8 mg/dL (ref 6–23)
CO2: 26 meq/L (ref 19–32)
CREATININE: 0.48 mg/dL — AB (ref 0.50–1.10)
Calcium: 9.5 mg/dL (ref 8.4–10.5)
Chloride: 102 mEq/L (ref 96–112)
Glucose, Bld: 150 mg/dL — ABNORMAL HIGH (ref 70–99)
Potassium: 3.2 mEq/L — ABNORMAL LOW (ref 3.5–5.3)
Sodium: 139 mEq/L (ref 135–145)
Total Bilirubin: 0.4 mg/dL (ref 0.2–1.2)
Total Protein: 6.3 g/dL (ref 6.0–8.3)

## 2015-02-21 NOTE — ED Provider Notes (Signed)
CSN: [id number]     Arrival date & time 02/17/15  1848 History   First MD Initiated Contact with Patient 02/17/15 1913     Chief Complaint  Patient presents with  . Vomiting  . Diarrhea     (Consider location/radiation/quality/duration/timing/severity/associated sxs/prior Treatment) HPI... Persistent nausea, vomiting, diarrhea for 2-3 days. Patient seen in the emergency department yesterday with similar symptoms. She was given IV fluids and sent home. Symptoms persist. If she has multiple health problems well documented in past medical history. No chest pain, dyspnea, fever, chills, dysuria. She is ambulatory.  Past Medical History  Diagnosis Date  . Diabetes mellitus   . Arthritis   . Anxiety   . Chronic pain     Bacl pain, Disc L5-S1- Dr. Joya Salm in [place]  . Hyperlipidemia   . Hypertension   . Tachycardia   . Bronchial asthma   . Atrial fibrillation   . COPD (chronic obstructive pulmonary disease)   . DDD (degenerative disc disease)     Radicular symptoms  . Shortness of breath   . CHF (congestive heart failure)   . Hx of echocardiogram     Was interpreted by Dr Doylene Canard that showed an Ef in the 50-60% range with grade 1 diastolic dysfunction. she had moderate MR, biatrial enlargement, moderate TR and at that time estimated RV systolic pressure was 43 mm.  . History of stress test 06/2011    Abnormal myocardial perfusion study.   Past Surgical History  Procedure Laterality Date  . Carpal tunnel release      x 2  . Breast cyst removed    . Abdominal hysterectomy      partial  . Tubal ligation    . Tonsillectomy    . Coronary artery bypass graft N/A 01/09/2014    Procedure: CORONARY ARTERY BYPASS GRAFTING (CABG) x 3 using endoscopically harvested right saphenous vein and left internal mammary artery and closure of left atrial appendage;  Surgeon: Ivin Poot, MD;  Location: [place];  Service: Open Heart Surgery;  Laterality: N/A;  patient has preop IA BP   . Intraoperative  transesophageal echocardiogram N/A 01/09/2014    Procedure: INTRAOPERATIVE TRANSESOPHAGEAL ECHOCARDIOGRAM;  Surgeon: Ivin Poot, MD;  Location: [place];  Service: Open Heart Surgery;  Laterality: N/A;  . Left heart catheterization with coronary angiogram N/A 01/07/2014    Procedure: LEFT HEART CATHETERIZATION WITH CORONARY ANGIOGRAM;  Surgeon: Lorretta Harp, MD;  Location[facility] CATH LAB;  Service: Cardiovascular;  Laterality: N/A;   Family History  Problem Relation Age of Onset  . Diabetes Mother   . Heart disease Mother   . Diabetes Sister   . Hypertension Sister   . Hyperlipidemia Sister   . Diabetes Brother   . Heart disease Brother   . Diabetes Brother   . Heart disease Brother    History  Substance Use Topics  . Smoking status: Former Smoker -- 1.50 packs/day for 30 years    Types: Cigarettes    Quit date: 05/26/2013  . Smokeless tobacco: Never Used     Comment: quit 6 months ago  . Alcohol Use: No   OB History    Gravida Para Term Preterm AB TAB SAB Ectopic Multiple Living   1 1 1        0     Review of Systems  All other systems reviewed and are negative.     Allergies  Adhesive; Latex; and Zocor  Home Medications   Prior to Admission medications  Medication Sig Start Date End Date Taking? Authorizing Provider  acetaminophen (TYLENOL) 500 MG tablet Take 500 mg by mouth every 6 (six) hours as needed for mild pain.   Yes Historical Provider, MD  albuterol (PROAIR HFA) 108 (90 BASE) MCG/ACT inhaler Inhale 2 puffs into the lungs every 6 (six) hours as needed for wheezing or shortness of breath. 08/14/14  Yes Alycia Rossetti, MD  aspirin EC 81 MG tablet Take 81 mg by mouth daily.   Yes Historical Provider, MD  atorvastatin (LIPITOR) 40 MG tablet Take 1 tablet (40 mg total) by mouth daily. 10/04/14  Yes Troy Sine, MD  dabigatran (PRADAXA) 150 MG CAPS capsule Take 1 capsule (150 mg total) by mouth 2 (two) times daily. 09/14/14  Yes Troy Sine, MD  digoxin  (LANOXIN) 0.125 MG tablet Take 1 tablet (0.125 mg total) by mouth daily. 09/04/14  Yes Troy Sine, MD  ferrous [id number] C-folic acid ([name] / FOLTRIN) capsule Take 1 capsule by mouth daily. 03/07/14  Yes Alycia Rossetti, MD  FLUoxetine (PROZAC) 20 MG tablet Take 1 tablet (20 mg total) by mouth daily. Take 60mg  once a day for mood 10/02/14  Yes Alycia Rossetti, MD  FLUoxetine (PROZAC) 40 MG capsule Take 1 tablet with 20mg  tablet of prozac once a day Patient taking differently: Take 60 mg by mouth daily. Take 1 tablet with 20mg  tablet of prozac once a day 10/02/14  Yes Alycia Rossetti, MD  fluticasone [facility]) 50 MCG/ACT nasal spray Place 2 sprays into both nostrils daily. 01/25/15  Yes Alycia Rossetti, MD  furosemide (LASIX) 40 MG tablet Take 0.5 tablets (20 mg total) by mouth daily. Take daily. 09/04/14  Yes Troy Sine, MD  JANUVIA 100 MG tablet TAKE ONE TABLET BY MOUTH DAILY. 08/14/14  Yes Alycia Rossetti, MD  ketoconazole (NIZORAL) 2 % shampoo Apply 1 application topically 2 (two) times a week. 08/14/14  Yes Alycia Rossetti, MD  lisinopril (PRINIVIL,ZESTRIL) 2.5 MG tablet Take 1 tablet (2.5 mg total) by mouth daily. 10/15/14  Yes Troy Sine, MD  metFORMIN (GLUCOPHAGE) 850 MG tablet Take 1 tablet (850 mg total) by mouth 2 (two) times daily with a meal. 01/28/15  Yes Alycia Rossetti, MD  metoprolol (LOPRESSOR) 50 MG tablet Take 1.5 tablets (75 mg total) by mouth 2 (two) times daily. 10/04/14  Yes Troy Sine, MD  omeprazole (PRILOSEC) 20 MG capsule Take 1 capsule (20 mg total) by mouth 2 (two) times daily. 02/16/15  Yes Tanna Furry, MD  ondansetron (ZOFRAN ODT) 4 MG disintegrating tablet Take 1 tablet (4 mg total) by mouth every 8 (eight) hours as needed for nausea. 02/16/15  Yes Tanna Furry, MD  oxyCODONE-acetaminophen (PERCOCET) 7.5-325 MG per tablet Take 1 tablet by mouth 2 (two) times daily as needed. 02/05/15  Yes Alycia Rossetti, MD  potassium chloride SA  (K-DUR,KLOR-CON) 20 MEQ tablet TAKE 1 TABLET BY MOUTH ONCE DAILY. TAKE WITH FUROSEMIDE. 08/27/14  Yes Alycia Rossetti, MD  sodium chloride (OCEAN) 0.65 % nasal spray Place 1 spray into the nose as needed for congestion. Congestion   Yes Historical Provider, MD  SYMBICORT 160-4.5 MCG/ACT inhaler INHALE 2 PUFFS INTO THE LUNGS TWICE DAILY. RINSE MOUTH AFTER USE. 12/17/14  Yes Alycia Rossetti, MD  ALPRAZolam Duanne Moron) 1 MG tablet TAKE ONE TABLET BY MOUTH 2 TIMES A DAY AS NEEDED. MUST LAST 30 DAYS. 02/20/15   Alycia Rossetti, MD  FLUoxetine (PROZAC) 20 MG  capsule TAKE (1) CAPSULE BY MOUTH EVERY DAY.TAKE WITH 40MG . 02/15/15   Alycia Rossetti, MD  ondansetron (ZOFRAN) 4 MG tablet Take 1 tablet (4 mg total) by mouth every 8 (eight) hours as needed for nausea or vomiting. 12/18/14   Francine Graven, DO  thiamine 100 MG tablet Take 1 tablet (100 mg total) by mouth daily. 10/27/13   Nita Sells, MD   BP 129/56 mmHg  Pulse 71  Temp(Src) 98.2 F (36.8 C) (Oral)  Resp 18  Ht 5\' 7"  (1.702 m)  Wt 142 lb (64.411 kg)  BMI 22.24 kg/m2  SpO2 97% Physical Exam  Constitutional: She is oriented to person, place, and time.  Looks slightly dehydrated, but nontoxic-appearing.  HENT:  Head: Normocephalic and atraumatic.  Eyes: Conjunctivae and EOM are normal. Pupils are equal, round, and reactive to light.  Neck: Normal range of motion. Neck supple.  Cardiovascular: Normal rate and regular rhythm.   Pulmonary/Chest: Effort normal and breath sounds normal.  Abdominal: Soft. Bowel sounds are normal.  Musculoskeletal: Normal range of motion.  Neurological: She is alert and oriented to person, place, and time.  Skin: Skin is warm and dry.  Psychiatric: She has a normal mood and affect. Her behavior is normal.  Nursing note and vitals reviewed.   ED Course  Procedures (including critical care time) Labs Review Labs Reviewed  COMPREHENSIVE METABOLIC PANEL - Abnormal; Notable for the following:    Potassium  3.2 (*)    Glucose, Bld 137 (*)    All other components within normal limits  CBC WITH DIFFERENTIAL/PLATELET - Abnormal; Notable for the following:    WBC 13.4 (*)    Hemoglobin 11.9 (*)    Neutrophils Relative % 78 (*)    Neutro Abs 10.3 (*)    Monocytes Absolute 1.1 (*)    All other components within normal limits  URINALYSIS, ROUTINE W REFLEX MICROSCOPIC - Abnormal; Notable for the following:    APPearance HAZY (*)    All other components within normal limits  LIPASE, BLOOD    Imaging Review No results found.   EKG Interpretation None      MDM   Final diagnoses:  Gastroenteritis    No acute abdomen. Patient feels better after 2 L of IV fluids. Potassium minimally low. Glucose acceptable. Patient has primary care follow-up this week.    Nat Christen, MD 02/21/15 [phone]

## 2015-02-22 ENCOUNTER — Other Ambulatory Visit: Payer: Medicare Other | Admitting: Licensed Clinical Social Worker

## 2015-02-22 ENCOUNTER — Telehealth: Payer: Self-pay | Admitting: *Deleted

## 2015-02-22 NOTE — Telephone Encounter (Signed)
Faxed PA for pradaxa to [facility].

## 2015-02-22 NOTE — Patient Outreach (Signed)
   Assessment:  CSW received referral on Shawntavia A Lapage.  CSW completed chart review on Mary [name].  Primary care physician for client is Dr. Vic Blackbird at [facility]. Referral for client to [facility] program was made by Dr. Buelah Manis.  Dr. Buelah Manis has documented that case is difficult. Numerous family members of client have called Dr. Buelah Manis or her staff to seek advice on managing current needs of client.  Client tested positive for opiates on recent drug screen during emergency room visit. Of concern also is that Tillman Abide, Licensed Practical Nurse with Dr. Buelah Manis, documented in recent note that family member of client called [name] and informed [name] that client had weapons in the home and that family members were concerned that client may be doing drugs in the home.  Family member reported to Tillman Abide that client has a friend that comes to visit client and client acts crazy after friend leaves. Family member concerned that friend of client may be giving client drugs to take.  Tillman Abide  documented that she told family member who called [name] that family member needed to immediately secure weapons in the home and call 911 at that time to manage current needs of client.  From documentation in EPIC notes, it appears that family members are fearful of client and on occasion have not wanted client to know they are discussing needs of client with doctor of client or nurse at practice of Dr. Buelah Manis.  Dr. Buelah Manis has stated that client is inconsistent in describing to doctor client use of prescribed medications.  CSW called home phone number for client on 02/22/15.  CSW was not able to speak via phone with client; however, CSW did leave phone message for client on 02/22/15 and asked in this message for client to please return call to [place] at [phone].  Also, in phone message left by CSW for client, CSW informed client that CSW had received referral for client from Dr. Vic Blackbird, primary doctor for  client. On 02/22/15, CSW also called RN Sherrin Daisy and discussed with Vaughan Basta aspects of this case referred to CSW.and referred to RN Sherrin Daisy.  CSW is awaiting return call from [name].   Plan:  CSW asked Freda Jackson on 02/22/15 to please mail [facility] consent form and return envelope to [employer].  Freda Jackson informed CSW that Legrand Como would mail [facility] consent form and return envelope to [place]. [place] on 02/22/15.  [place] to communicate as needed with Dr. Buelah Manis to discuss medical needs of client.  CSW to communicate with [facility] management to discuss referral given to CSW and RN and to discuss current safety concerns of CSW regarding [facility] involvement in case referred.    [name].Lamyiah Crawshaw MSW, LCSW Licensed Clinical Social Worker [facility] Care Management [phone]

## 2015-02-27 ENCOUNTER — Other Ambulatory Visit: Payer: Self-pay | Admitting: *Deleted

## 2015-02-27 ENCOUNTER — Other Ambulatory Visit: Payer: Self-pay | Admitting: Family Medicine

## 2015-02-27 ENCOUNTER — Ambulatory Visit (HOSPITAL_COMMUNITY)
Admission: RE | Admit: 2015-02-27 | Discharge: 2015-02-27 | Disposition: A | Discharge status: Home / Self Care | Payer: Medicare Other | Source: Ambulatory Visit | Attending: Family Medicine | Admitting: Family Medicine

## 2015-02-27 DIAGNOSIS — I639 Cerebral infarction, unspecified: Secondary | ICD-10-CM | POA: Diagnosis not present

## 2015-02-27 DIAGNOSIS — R410 Disorientation, unspecified: Secondary | ICD-10-CM | POA: Insufficient documentation

## 2015-02-27 DIAGNOSIS — R55 Syncope and collapse: Secondary | ICD-10-CM | POA: Diagnosis not present

## 2015-02-27 DIAGNOSIS — R531 Weakness: Secondary | ICD-10-CM

## 2015-02-27 DIAGNOSIS — R930 Abnormal findings on diagnostic imaging of skull and head, not elsewhere classified: Secondary | ICD-10-CM | POA: Diagnosis not present

## 2015-02-27 DIAGNOSIS — R9082 White matter disease, unspecified: Secondary | ICD-10-CM

## 2015-02-28 ENCOUNTER — Other Ambulatory Visit: Payer: Self-pay | Admitting: Licensed Clinical Social Worker

## 2015-02-28 ENCOUNTER — Ambulatory Visit: Payer: Medicare Other | Admitting: Neurology

## 2015-02-28 NOTE — Telephone Encounter (Signed)
Refill appropriate and filled per protocol. 

## 2015-02-28 NOTE — Patient Outreach (Signed)
Assessment: CSW called client on 02/28/15 and spoke via phone with client on 02/28/15.  CSW verified identity of client.  CSW spoke with client about [facility] program and support services through [facility] program. [place] and client spoke about [facility] consent form and completion of [facility] consent form  [facility] consent form has been mailed to client for her to review and complete.  Client gave CSW verbal permission to speak with client about medical needs of client. She said she has decreased energy, gets short of breath easily.  She said she uses a cane to ambulate and has to take rest breaks when walking. She said climbing steps is difficult at present.  She said she had appointment last week with Dr. Buelah Manis, primary doctor.  She said that Dr Buelah Manis had ordered MRI for client. Client said that she (client) had the MRI (brain scan) on 02/27/15 at [facility]. She said that she sometimes blacks out, or passes out. She said she had difficulty remembering and concentrating.  She said she has headaches constantly.  She said Dr. Buelah Manis suspected that client could be having mini strokes and Dr. Buelah Manis wants client to see medical specialist.  Client said she had a heart attack last March and had heart surgery on January 09, 2014 at [facility] in [place].  She said she does not use oxygen in the home; however, she uses nebulizer as prescribed and using inhaler as prescribed.  She said she has neuropathy in her legs and has atrial fibrillation.  She said she sees Dr. Claiborne Billings as her heart doctor.  She and CSW discussed aspects of [facility] program support. CSW informed Kamiah that since she was seen by Dr. Buelah Manis, a [place] provider, that [facility] program services were free for client.  CSW informed [place] that CSW would update RN Sherrin Daisy related to above information.  Client agreed to this plan. CSW encouraged Marcelene to review and complete [facility] consent form when it came to her in the mail and then return completed form to [facility] office in [place],  [place].  [place] gave client the [place] number of [phone] and encouraged [place] to call CSW as needed to discuss social work needs of client.  Client was appreciative of phone call from [place] on 02/28/15.  Plan: Client to review and complete [facility] consent form and return completed [facility] consent form to [facility] office in [place], [place]. Client to take medications as prescribed and to attend scheduled medical appointments. CSW to call client in one week to assess needs of client at that time.   [name].Amed Datta MSW, LCSW Licensed Clinical Social Worker [facility] Care Management [phone]

## 2015-03-06 ENCOUNTER — Ambulatory Visit: Payer: Medicare Other | Admitting: Licensed Clinical Social Worker

## 2015-03-06 ENCOUNTER — Encounter: Payer: Self-pay | Admitting: Neurology

## 2015-03-06 ENCOUNTER — Ambulatory Visit (INDEPENDENT_AMBULATORY_CARE_PROVIDER_SITE_OTHER): Payer: Medicare Other | Admitting: Neurology

## 2015-03-06 VITALS — BP 103/60 | HR 82 | Ht 67.0 in | Wt 148.0 lb

## 2015-03-06 DIAGNOSIS — I639 Cerebral infarction, unspecified: Secondary | ICD-10-CM

## 2015-03-06 DIAGNOSIS — R404 Transient alteration of awareness: Secondary | ICD-10-CM | POA: Diagnosis not present

## 2015-03-06 NOTE — Patient Instructions (Addendum)
Overall you are doing fairly well but I do want to suggest a few things today:   Remember to drink plenty of fluid, eat healthy meals and do not skip any meals. Try to eat protein with a every meal and eat a healthy snack such as fruit or nuts in between meals. Try to keep a regular sleep-wake schedule and try to exercise daily, particularly in the form of walking, 20-30 minutes a day, if you can.   As far as your medications are concerned, I would like to suggest  As far as diagnostic testing: EEG  I would like to see you back in 4 months, sooner if we need to. Please call us with any interim questions, concerns, problems, updates or refill requests.   Please also call us for any test results so we can go over those with you on the phone.  My clinical assistant and will answer any of your questions and relay your messages to me and also relay most of my messages to you.   Our phone number is [phone]. We also have an after hours call service for urgent matters and there is a physician on-call for urgent questions. For any emergencies you know to call 911 or go to the nearest emergency room

## 2015-03-06 NOTE — Progress Notes (Signed)
[facility]    Provider:  Dr Jaynee Eagles Referring Provider: Alycia Rossetti, MD Primary Care Physician:  Vic Blackbird, MD  CC:  Abnormal white matter changes  HPI:  Mary [name] is a 64 y.o. female here as a referral from Dr. Buelah Manis for Abnormal white matter. Past medical history of diabetes, anxiety, chronic pain, congestive heart failure, hypertension, hyperlipidemia. Here for follow up on abnormal white matter on MRi and stroke. She has been noncompliant with her Pradaxa.  Patient showed up 25 minutes late for appointment. Gave option to wait until after the next patient appointment or to reschedule. She declined both options. Explained then that this will be an abbreviated appointment.   Patient was referred here for abnormal white matter changes.  She was having headaches and passing out. She was "walking and talking", she was then brought to the ED twice. She has headaches. She has atrial fibrillation. She has been having episodes of altered consciousness, "talking out of her head". She has had 4 episodes of this. She has diabetes, CAD s/p bypass. High cholesterol. She smoked for many years, she smoked a pack a day for many years. She had missed 4-5 doses of her pradaxa before the stroke occurred. She is quite tangential, and difficult to redirect.   Reviewed notes, labs and imaging from outside physicians, which showed:  Drive the brain, personally reviewed images and agree with findings below IMPRESSION: 1. Punctate focus of restricted diffusion within the subcortical white matter of the right postcentral gyrus compatible with an acute nonhemorrhagic infarct. 2. Moderate age advanced periventricular and subcortical T2 changes are otherwise similar to the prior study.  CMP with mildly low potassium, elevated glucose  Primary care notes state that patient has been "acting crazy out of her head" and thinks that she is taking illicit medications are overtaking her  prescribed medication. Patient denied any street drugs. She has failed drug screens.   Review of Systems: Patient complains of symptoms per HPI as well as the following symptoms: Denies fever or any systemic symptoms. Pertinent negatives per HPI. All others negative.   History   Social History  . Marital Status: Divorced    Spouse Name: N/A  . Number of Children: 1  . Years of Education: 11   Occupational History  . Not on file.   Social History Main Topics  . Smoking status: Former Smoker -- 1.50 packs/day for 30 years    Types: Cigarettes    Quit date: 05/26/2013  . Smokeless tobacco: Never Used     Comment: quit 6 months ago  . Alcohol Use: No  . Drug Use: No  . Sexual Activity: Yes    Birth Control/ Protection: Surgical   Other Topics Concern  . Not on file   Social History Narrative   Lives at home with sister and sister-in-law   Caffeine use : drink 1 cup coffee in morning        Family History  Problem Relation Age of Onset  . Diabetes Mother   . Heart disease Mother   . Diabetes Sister   . Hypertension Sister   . Hyperlipidemia Sister   . Diabetes Brother   . Heart disease Brother   . Diabetes Brother   . Heart disease Brother     Past Medical History  Diagnosis Date  . Diabetes mellitus   . Arthritis   . Anxiety   . Chronic pain     Bacl pain, Disc L5-S1- Dr. Joya Salm in  [place]  . Hyperlipidemia   . Hypertension   . Tachycardia   . Bronchial asthma   . Atrial fibrillation   . COPD (chronic obstructive pulmonary disease)   . DDD (degenerative disc disease)     Radicular symptoms  . Shortness of breath   . CHF (congestive heart failure)   . Hx of echocardiogram     Was interpreted by Dr Doylene Canard that showed an Ef in the 50-60% range with grade 1 diastolic dysfunction. she had moderate MR, biatrial enlargement, moderate TR and at that time estimated RV systolic pressure was 43 mm.  . History of stress test 06/2011    Abnormal myocardial  perfusion study.    Past Surgical History  Procedure Laterality Date  . Carpal tunnel release      x 2  . Breast cyst removed    . Abdominal hysterectomy      partial  . Tubal ligation    . Tonsillectomy    . Coronary artery bypass graft N/A 01/09/2014    Procedure: CORONARY ARTERY BYPASS GRAFTING (CABG) x 3 using endoscopically harvested right saphenous vein and left internal mammary artery and closure of left atrial appendage;  Surgeon: Ivin Poot, MD;  Location: [place];  Service: Open Heart Surgery;  Laterality: N/A;  patient has preop IA BP   . Intraoperative transesophageal echocardiogram N/A 01/09/2014    Procedure: INTRAOPERATIVE TRANSESOPHAGEAL ECHOCARDIOGRAM;  Surgeon: Ivin Poot, MD;  Location: [place];  Service: Open Heart Surgery;  Laterality: N/A;  . Left heart catheterization with coronary angiogram N/A 01/07/2014    Procedure: LEFT HEART CATHETERIZATION WITH CORONARY ANGIOGRAM;  Surgeon: Lorretta Harp, MD;  Location: [facility] CATH LAB;  Service: Cardiovascular;  Laterality: N/A;    Current Outpatient Prescriptions  Medication Sig Dispense Refill  . acetaminophen (TYLENOL) 500 MG tablet Take 500 mg by mouth every 6 (six) hours as needed for mild pain.    Marland Kitchen albuterol (PROAIR HFA) 108 (90 BASE) MCG/ACT inhaler Inhale 2 puffs into the lungs every 6 (six) hours as needed for wheezing or shortness of breath. 18 g 11  . ALPRAZolam (XANAX) 1 MG tablet TAKE ONE TABLET BY MOUTH 2 TIMES A DAY AS NEEDED. MUST LAST 30 DAYS. 60 tablet 2  . aspirin EC 81 MG tablet Take 81 mg by mouth daily.    Marland Kitchen atorvastatin (LIPITOR) 40 MG tablet Take 1 tablet (40 mg total) by mouth daily. 90 tablet 3  . dabigatran (PRADAXA) 150 MG CAPS capsule Take 1 capsule (150 mg total) by mouth 2 (two) times daily. 60 capsule 5  . digoxin (LANOXIN) 0.125 MG tablet Take 1 tablet (0.125 mg total) by mouth daily. 30 tablet 6  . FLUoxetine (PROZAC) 20 MG capsule TAKE (1) CAPSULE BY MOUTH EVERY DAY.TAKE WITH 40MG . 30  capsule 1  . FLUoxetine (PROZAC) 40 MG capsule Take 1 tablet with 20mg  tablet of prozac once a day (Patient taking differently: Take 60 mg by mouth daily. Take 1 tablet with 20mg  tablet of prozac once a day) 30 capsule 3  . fluticasone (FLONASE) 50 MCG/ACT nasal spray Place 2 sprays into both nostrils daily. 16 g 6  . furosemide (LASIX) 40 MG tablet Take 0.5 tablets (20 mg total) by mouth daily. Take daily. 30 tablet 2  . JANUVIA 100 MG tablet TAKE ONE TABLET BY MOUTH DAILY. 30 tablet 6  . ketoconazole (NIZORAL) 2 % shampoo Apply 1 application topically 2 (two) times a week. 120 mL 0  . lisinopril (  PRINIVIL,ZESTRIL) 2.5 MG tablet Take 1 tablet (2.5 mg total) by mouth daily. 30 tablet 11  . metFORMIN (GLUCOPHAGE) 850 MG tablet Take 1 tablet (850 mg total) by mouth 2 (two) times daily with a meal. 60 tablet 3  . metoprolol (LOPRESSOR) 50 MG tablet Take 1.5 tablets (75 mg total) by mouth 2 (two) times daily. 90 tablet 11  . omeprazole (PRILOSEC) 20 MG capsule Take 1 capsule (20 mg total) by mouth 2 (two) times daily. 60 capsule 1  . ondansetron (ZOFRAN ODT) 4 MG disintegrating tablet Take 1 tablet (4 mg total) by mouth every 8 (eight) hours as needed for nausea. 6 tablet 0  . ondansetron (ZOFRAN) 4 MG tablet Take 1 tablet (4 mg total) by mouth every 8 (eight) hours as needed for nausea or vomiting. 6 tablet 0  . oxyCODONE-acetaminophen (PERCOCET) 7.5-325 MG per tablet Take 1 tablet by mouth 2 (two) times daily as needed. 30 tablet 0  . potassium chloride SA (K-DUR,KLOR-CON) 20 MEQ tablet TAKE 1 TABLET BY MOUTH ONCE DAILY. TAKE WITH FUROSEMIDE. 30 tablet 0  . sodium chloride (OCEAN) 0.65 % nasal spray Place 1 spray into the nose as needed for congestion. Congestion    . SYMBICORT 160-4.5 MCG/ACT inhaler INHALE 2 PUFFS INTO THE LUNGS TWICE DAILY. RINSE MOUTH AFTER USE. 10.2 g 3  . thiamine 100 MG tablet Take 1 tablet (100 mg total) by mouth daily. 30 tablet 0  . TL ICON capsule TAKE 1 CAPSULE BY MOUTH  ONCE DAILY. 30 capsule 3   No current facility-administered medications for this visit.    Allergies as of 03/06/2015 - Review Complete 03/06/2015  Allergen Reaction Noted  . Adhesive [tape] Other (See Comments) 09/10/2012  . Latex Other (See Comments) 09/10/2012  . Zocor [simvastatin - high dose] Nausea And Vomiting 05/26/2011    Vitals: BP 103/60 mmHg  Pulse 82  Ht 5\' 7"  (1.702 m)  Wt 148 lb (67.132 kg)  BMI 23.17 kg/m2 Last Weight:  Wt Readings from Last 1 Encounters:  03/06/15 148 lb (67.132 kg)   Last Height:   Ht Readings from Last 1 Encounters:  03/06/15 5\' 7"  (1.702 m)   Physical exam: Exam: Gen: NAD, conversant, well nourised, obese, well groomed                     CV: irregular, no MRG. No Carotid Bruits. No peripheral edema, warm, nontender Eyes: Conjunctivae clear without exudates or hemorrhage  Speech: No dysarthria or aphasia Cognition:    The patient is oriented to person, place.  Cranial Nerves:    The pupils are equal, round, and reactive to light. Attempted funduscopic exam but could not visualize. Visual fields are full to finger confrontation. Extraocular movements are intact. Trigeminal sensation is intact and the muscles of mastication are normal. The face is symmetric. The palate elevates in the midline. Hearing grossly intact to voice. Voice is normal. Shoulder shrug is normal. The tongue has normal motion without fasciculations.   Coordination:    No dysmetria  Gait:    Walks with a cane       Assessment/Plan:   Mary [name] is a 64 y.o. female here as a referral from Dr. Buelah Manis for Abnormal white matter. Past medical history of diabetes, anxiety, chronic pain, congestive heart failure, hypertension, hyperlipidemia, past smoker, CAD. Here for follow up on abnormal white matter on MRi and stroke. She has been noncompliant with her Pradaxa. She has been having episodes of altered  consciousness, "talking out of her head". Mri showed Punctate  focus of restricted diffusion within the subcortical white matter of the right postcentral gyrus compatible with an acute nonhemorrhagic infarct and moderate age advanced periventricular and subcortical T2 changes are otherwise similar to the prior study.    Acute nonhemorrhagic stroke secondary to noncompliance with Pradaxa, A. fib. Explained to patient that this is likely why she had the stroke.  highly encouraged not missing her Pradaxa, taking it daily. Needs to follow closely with primary care for management of vascular risk factors including diabetes, hyperlipidemia, hypertension.  For episodes of altered consciousness, will order an EEG.   Sarina Ill, MD  [facility] [address] [place], [place] [address]  Phone [phone] Fax [phone]

## 2015-03-07 ENCOUNTER — Other Ambulatory Visit: Payer: Self-pay | Admitting: Licensed Clinical Social Worker

## 2015-03-07 NOTE — Patient Outreach (Signed)
  Assessment: CSW received phone call from [name] on 03/07/15.  Jaelynn and CSW spoke of upcoming appointments for client.  CSW also spoke with Bethena Roys about [facility] consent form Elivia recently received via mail. CSW reviewed with Shalayla on 03/07/15 the [facility] consent form and how she could complete [facility] consent form.  CSW encouraged [place] to complete [facility] consent form and please return completed [facility] consent form to [facility] office in [place], [place].  Avaeh said she understood information and review of [facility] consent form and understood information about completion of [facility] consent form.  CSW spoke with Yanisha about the fact that [place] would be working telephonically with client to address needs of client. CSW reminded Roslynn that her primary doctor, Dr. Buelah Manis, had made [facility] referral for client.  CSW thanked Adelinne for phone conversation on 03/07/15. Tykisha has [place] phone number of [phone] to call to address social work needs for [employer].  Plan: [place] to review and complete [facility] consent form and mail completed [facility] consent form to [facility] office in [place], [place]. [employer] to call [name] in two weeks to assess needs of [name] at that time.  [name].Crystall Donaldson MSW, LCSW Licensed Clinical Social Worker [facility] Care Management [phone]

## 2015-03-10 ENCOUNTER — Encounter: Payer: Self-pay | Admitting: Neurology

## 2015-03-11 ENCOUNTER — Other Ambulatory Visit: Payer: Self-pay | Admitting: Licensed Clinical Social Worker

## 2015-03-11 NOTE — Patient Outreach (Signed)
  Assessment:  CSW called home phone number of client on 03/11/15 but did not speak via phone with client. CSW did leave phone message for client on 03/11/15 requesting that client please return call to [place] at [phone] to discuss current needs of client.  CSW is awaiting return call from client.      Plans: CSW communicated via phone with Sherrin Daisy RN on 03/11/15 regarding current needs of client. Client to review [facility] consent form, complete [facility] consent form, and mail completed [facility] consent form to [facility] office in [place]. If client does not call CSW in 48 hours, then CSW will again try to call client later this week to discuss needs of client.   [name].Elvie Palomo MSW, LCSW Licensed Clinical Social Worker [facility] Care Management [phone]

## 2015-03-13 ENCOUNTER — Telehealth: Payer: Self-pay | Admitting: Neurology

## 2015-03-13 NOTE — Telephone Encounter (Signed)
LMVM to inform her that her EEG is scheduled at [facility] on 03/18/15 @2 :45pm. Advised her to report to the admitting office

## 2015-03-17 ENCOUNTER — Encounter (HOSPITAL_COMMUNITY): Payer: Self-pay | Admitting: *Deleted

## 2015-03-17 ENCOUNTER — Emergency Department (HOSPITAL_COMMUNITY): Payer: Medicare Other

## 2015-03-17 ENCOUNTER — Emergency Department (HOSPITAL_COMMUNITY)
Admission: EM | Admit: 2015-03-17 | Discharge: 2015-03-17 | Disposition: A | Discharge status: Home / Self Care | Payer: Medicare Other | Attending: Emergency Medicine | Admitting: Emergency Medicine

## 2015-03-17 DIAGNOSIS — E785 Hyperlipidemia, unspecified: Secondary | ICD-10-CM | POA: Insufficient documentation

## 2015-03-17 DIAGNOSIS — G8929 Other chronic pain: Secondary | ICD-10-CM | POA: Insufficient documentation

## 2015-03-17 DIAGNOSIS — I509 Heart failure, unspecified: Secondary | ICD-10-CM | POA: Diagnosis not present

## 2015-03-17 DIAGNOSIS — Z7982 Long term (current) use of aspirin: Secondary | ICD-10-CM | POA: Diagnosis not present

## 2015-03-17 DIAGNOSIS — Z951 Presence of aortocoronary bypass graft: Secondary | ICD-10-CM | POA: Diagnosis not present

## 2015-03-17 DIAGNOSIS — I4891 Unspecified atrial fibrillation: Secondary | ICD-10-CM | POA: Diagnosis not present

## 2015-03-17 DIAGNOSIS — R197 Diarrhea, unspecified: Secondary | ICD-10-CM | POA: Diagnosis not present

## 2015-03-17 DIAGNOSIS — R1110 Vomiting, unspecified: Secondary | ICD-10-CM | POA: Diagnosis not present

## 2015-03-17 DIAGNOSIS — Z91040 Latex allergy status: Secondary | ICD-10-CM | POA: Diagnosis not present

## 2015-03-17 DIAGNOSIS — K529 Noninfective gastroenteritis and colitis, unspecified: Secondary | ICD-10-CM | POA: Diagnosis not present

## 2015-03-17 DIAGNOSIS — E119 Type 2 diabetes mellitus without complications: Secondary | ICD-10-CM | POA: Diagnosis not present

## 2015-03-17 DIAGNOSIS — Z79899 Other long term (current) drug therapy: Secondary | ICD-10-CM | POA: Diagnosis not present

## 2015-03-17 DIAGNOSIS — Z9889 Other specified postprocedural states: Secondary | ICD-10-CM | POA: Insufficient documentation

## 2015-03-17 DIAGNOSIS — F419 Anxiety disorder, unspecified: Secondary | ICD-10-CM | POA: Diagnosis not present

## 2015-03-17 DIAGNOSIS — J449 Chronic obstructive pulmonary disease, unspecified: Secondary | ICD-10-CM | POA: Insufficient documentation

## 2015-03-17 DIAGNOSIS — M1990 Unspecified osteoarthritis, unspecified site: Secondary | ICD-10-CM | POA: Diagnosis not present

## 2015-03-17 DIAGNOSIS — R112 Nausea with vomiting, unspecified: Secondary | ICD-10-CM | POA: Diagnosis not present

## 2015-03-17 DIAGNOSIS — Z7902 Long term (current) use of antithrombotics/antiplatelets: Secondary | ICD-10-CM | POA: Diagnosis not present

## 2015-03-17 DIAGNOSIS — Z87891 Personal history of nicotine dependence: Secondary | ICD-10-CM | POA: Diagnosis not present

## 2015-03-17 DIAGNOSIS — E860 Dehydration: Secondary | ICD-10-CM | POA: Diagnosis not present

## 2015-03-17 LAB — CBC WITH DIFFERENTIAL/PLATELET
Basophils Absolute: 0 10*3/uL (ref 0.0–0.1)
Basophils Relative: 0 % (ref 0–1)
EOS ABS: 0.1 10*3/uL (ref 0.0–0.7)
Eosinophils Relative: 0 % (ref 0–5)
HCT: 34.8 % — ABNORMAL LOW (ref 36.0–46.0)
Hemoglobin: 11.2 g/dL — ABNORMAL LOW (ref 12.0–15.0)
LYMPHS ABS: 1.9 10*3/uL (ref 0.7–4.0)
LYMPHS PCT: 14 % (ref 12–46)
MCH: 29.9 pg (ref 26.0–34.0)
MCHC: 32.2 g/dL (ref 30.0–36.0)
MCV: 92.8 fL (ref 78.0–100.0)
MONO ABS: 1.1 10*3/uL — AB (ref 0.1–1.0)
Monocytes Relative: 8 % (ref 3–12)
NEUTROS PCT: 78 % — AB (ref 43–77)
Neutro Abs: 10.5 10*3/uL — ABNORMAL HIGH (ref 1.7–7.7)
Platelets: 285 10*3/uL (ref 150–400)
RBC: 3.75 MIL/uL — AB (ref 3.87–5.11)
RDW: 15.3 % (ref 11.5–15.5)
WBC: 13.5 10*3/uL — AB (ref 4.0–10.5)

## 2015-03-17 LAB — COMPREHENSIVE METABOLIC PANEL
ALT: 12 U/L — AB (ref 14–54)
AST: 19 U/L (ref 15–41)
Albumin: 4.2 g/dL (ref 3.5–5.0)
Alkaline Phosphatase: 59 U/L (ref 38–126)
Anion gap: 12 (ref 5–15)
BUN: 10 mg/dL (ref 6–20)
CO2: 27 mmol/L (ref 22–32)
Calcium: 9.6 mg/dL (ref 8.9–10.3)
Chloride: 96 mmol/L — ABNORMAL LOW (ref 101–111)
Creatinine, Ser: 0.53 mg/dL (ref 0.44–1.00)
GFR calc Af Amer: >60 mL/min (ref >60)
GFR calc non Af Amer: >60 mL/min (ref >60)
Glucose, Bld: 152 mg/dL — ABNORMAL HIGH (ref 65–99)
Potassium: 3.8 mmol/L (ref 3.5–5.1)
Sodium: 135 mmol/L (ref 135–145)
Total Bilirubin: 0.9 mg/dL (ref 0.3–1.2)
Total Protein: 7.6 g/dL (ref 6.5–8.1)

## 2015-03-17 LAB — LIPASE, BLOOD: LIPASE: 22 U/L (ref 22–51)

## 2015-03-17 MED ORDER — SODIUM CHLORIDE 0.9 % IV BOLUS (SEPSIS)
1000.0000 mL | Freq: Once | INTRAVENOUS | Status: AC
  Administered 2015-03-17: 1000 mL via INTRAVENOUS

## 2015-03-17 MED ORDER — ONDANSETRON 4 MG PO TBDP
ORAL_TABLET | ORAL | Status: DC
Start: 2015-03-17 — End: 2015-04-17

## 2015-03-17 MED ORDER — DICYCLOMINE HCL 20 MG PO TABS: ORAL_TABLET | ORAL | Status: DC

## 2015-03-17 MED ORDER — PANTOPRAZOLE SODIUM 40 MG IV SOLR
40.0000 mg | Freq: Once | INTRAVENOUS | Status: AC
  Administered 2015-03-17: 40 mg via INTRAVENOUS
  Filled 2015-03-17: qty 40

## 2015-03-17 MED ORDER — KETOROLAC TROMETHAMINE 30 MG/ML IJ SOLN
30.0000 mg | Freq: Once | INTRAMUSCULAR | Status: AC
  Administered 2015-03-17: 30 mg via INTRAVENOUS
  Filled 2015-03-17: qty 1

## 2015-03-17 MED ORDER — ONDANSETRON HCL 4 MG/2ML IJ SOLN
4.0000 mg | Freq: Once | INTRAMUSCULAR | Status: AC
  Administered 2015-03-17: 4 mg via INTRAVENOUS
  Filled 2015-03-17: qty 2

## 2015-03-17 NOTE — Discharge Instructions (Signed)
Drink plenty of fluids and follow up with your md this week.

## 2015-03-17 NOTE — ED Provider Notes (Signed)
CSN: [id number]     Arrival date & time 03/17/15  2015 History   First MD Initiated Contact with Patient 03/17/15 2023     This chart was scribed for Milton Ferguson, MD by Forrestine Him, ED Scribe. This patient was seen in room APA03/APA03 and the patient's care was started 8:30 PM.   Chief Complaint  Patient presents with  . Emesis   Patient is a 64 y.o. female presenting with vomiting. The history is provided by the patient. No language interpreter was used.  Emesis Severity:  Moderate Duration:  1 day Timing:  Intermittent Progression:  Unchanged Chronicity:  Recurrent Relieved by:  Nothing Worsened by:  Nothing tried Ineffective treatments:  None tried Associated symptoms: abdominal pain and diarrhea   Associated symptoms: no chills, no fever and no headaches   Risk factors: sick contacts     HPI Comments: TASHA CHOCK is a 64 y.o. female who presents to the Emergency Department complaining of intermittent, ongoing, unchanged vomiting x 1 day. Pt also reports 4-5 episodes of diarrhea today, nausea, along with ongoing abdominal pain. Ms. Bechtold attributes symptoms to possible food poising from a stew she ate yesterday. No OTC or home remedies attempted prior to arrival. No recent fever or chills. Sister has recently been ill, but no other known sick contacts. Pt was evaluated 4/24 for same symptoms. IV fluids given during visit. Pt was then discharged home with instruction to follow with PCP. Pt with known allergy to Zocor.  Past Medical History  Diagnosis Date  . Diabetes mellitus   . Arthritis   . Anxiety   . Chronic pain     Bacl pain, Disc L5-S1- Dr. Joya Salm in [place]  . Hyperlipidemia   . Hypertension   . Tachycardia   . Bronchial asthma   . Atrial fibrillation   . COPD (chronic obstructive pulmonary disease)   . DDD (degenerative disc disease)     Radicular symptoms  . Shortness of breath   . CHF (congestive heart failure)   . Hx of echocardiogram     Was interpreted by Dr  Doylene Canard that showed an Ef in the 50-60% range with grade 1 diastolic dysfunction. she had moderate MR, biatrial enlargement, moderate TR and at that time estimated RV systolic pressure was 43 mm.  . History of stress test 06/2011    Abnormal myocardial perfusion study.   Past Surgical History  Procedure Laterality Date  . Carpal tunnel release      x 2  . Breast cyst removed    . Abdominal hysterectomy      partial  . Tubal ligation    . Tonsillectomy    . Coronary artery bypass graft N/A 01/09/2014    Procedure: CORONARY ARTERY BYPASS GRAFTING (CABG) x 3 using endoscopically harvested right saphenous vein and left internal mammary artery and closure of left atrial appendage;  Surgeon: Ivin Poot, MD;  Location: [place];  Service: Open Heart Surgery;  Laterality: N/A;  patient has preop IA BP   . Intraoperative transesophageal echocardiogram N/A 01/09/2014    Procedure: INTRAOPERATIVE TRANSESOPHAGEAL ECHOCARDIOGRAM;  Surgeon: Ivin Poot, MD;  Location: [place];  Service: Open Heart Surgery;  Laterality: N/A;  . Left heart catheterization with coronary angiogram N/A 01/07/2014    Procedure: LEFT HEART CATHETERIZATION WITH CORONARY ANGIOGRAM;  Surgeon: Lorretta Harp, MD;  Location: [facility] CATH LAB;  Service: Cardiovascular;  Laterality: N/A;   Family History  Problem Relation Age of Onset  . Diabetes  Mother   . Heart disease Mother   . Diabetes Sister   . Hypertension Sister   . Hyperlipidemia Sister   . Diabetes Brother   . Heart disease Brother   . Diabetes Brother   . Heart disease Brother    History  Substance Use Topics  . Smoking status: Former Smoker -- 1.50 packs/day for 30 years    Types: Cigarettes    Quit date: 05/26/2013  . Smokeless tobacco: Never Used     Comment: quit 6 months ago  . Alcohol Use: No   OB History    Gravida Para Term Preterm AB TAB SAB Ectopic Multiple Living   1 1 1        0     Review of Systems  Constitutional: Negative for fever,  chills, appetite change and fatigue.  HENT: Negative for congestion, ear discharge and sinus pressure.   Eyes: Negative for discharge.  Respiratory: Negative for cough.   Cardiovascular: Negative for chest pain.  Gastrointestinal: Positive for nausea, vomiting, abdominal pain and diarrhea.  Genitourinary: Negative for frequency and hematuria.  Musculoskeletal: Negative for back pain.  Skin: Negative for rash.  Neurological: Negative for seizures and headaches.  Psychiatric/Behavioral: Negative for hallucinations.      Allergies  Adhesive; Latex; and Zocor  Home Medications   Prior to Admission medications   Medication Sig Start Date End Date Taking? Authorizing Provider  acetaminophen (TYLENOL) 500 MG tablet Take 500 mg by mouth every 6 (six) hours as needed for mild pain.    Historical Provider, MD  albuterol (PROAIR HFA) 108 (90 BASE) MCG/ACT inhaler Inhale 2 puffs into the lungs every 6 (six) hours as needed for wheezing or shortness of breath. 08/14/14   Alycia Rossetti, MD  ALPRAZolam (XANAX) 1 MG tablet TAKE ONE TABLET BY MOUTH 2 TIMES A DAY AS NEEDED. MUST LAST 30 DAYS. 02/20/15   Alycia Rossetti, MD  aspirin EC 81 MG tablet Take 81 mg by mouth daily.    Historical Provider, MD  atorvastatin (LIPITOR) 40 MG tablet Take 1 tablet (40 mg total) by mouth daily. 10/04/14   Troy Sine, MD  dabigatran (PRADAXA) 150 MG CAPS capsule Take 1 capsule (150 mg total) by mouth 2 (two) times daily. 09/14/14   Troy Sine, MD  digoxin (LANOXIN) 0.125 MG tablet Take 1 tablet (0.125 mg total) by mouth daily. 09/04/14   Troy Sine, MD  FLUoxetine (PROZAC) 20 MG capsule TAKE (1) CAPSULE BY MOUTH EVERY DAY.TAKE WITH 40MG . 02/15/15   Alycia Rossetti, MD  FLUoxetine (PROZAC) 40 MG capsule Take 1 tablet with 20mg  tablet of prozac once a day Patient taking differently: Take 60 mg by mouth daily. Take 1 tablet with 20mg  tablet of prozac once a day 10/02/14   Alycia Rossetti, MD  fluticasone  [facility]) 50 MCG/ACT nasal spray Place 2 sprays into both nostrils daily. 01/25/15   Alycia Rossetti, MD  furosemide (LASIX) 40 MG tablet Take 0.5 tablets (20 mg total) by mouth daily. Take daily. 09/04/14   Troy Sine, MD  JANUVIA 100 MG tablet TAKE ONE TABLET BY MOUTH DAILY. 08/14/14   Alycia Rossetti, MD  ketoconazole (NIZORAL) 2 % shampoo Apply 1 application topically 2 (two) times a week. 08/14/14   Alycia Rossetti, MD  lisinopril (PRINIVIL,ZESTRIL) 2.5 MG tablet Take 1 tablet (2.5 mg total) by mouth daily. 10/15/14   Troy Sine, MD  metFORMIN (GLUCOPHAGE) 850 MG tablet Take 1 tablet (  850 mg total) by mouth 2 (two) times daily with a meal. 01/28/15   Alycia Rossetti, MD  metoprolol (LOPRESSOR) 50 MG tablet Take 1.5 tablets (75 mg total) by mouth 2 (two) times daily. 10/04/14   Troy Sine, MD  omeprazole (PRILOSEC) 20 MG capsule Take 1 capsule (20 mg total) by mouth 2 (two) times daily. 02/16/15   Tanna Furry, MD  ondansetron (ZOFRAN ODT) 4 MG disintegrating tablet Take 1 tablet (4 mg total) by mouth every 8 (eight) hours as needed for nausea. 02/16/15   Tanna Furry, MD  ondansetron (ZOFRAN) 4 MG tablet Take 1 tablet (4 mg total) by mouth every 8 (eight) hours as needed for nausea or vomiting. 12/18/14   Francine Graven, DO  oxyCODONE-acetaminophen (PERCOCET) 7.5-325 MG per tablet Take 1 tablet by mouth 2 (two) times daily as needed. 02/05/15   Alycia Rossetti, MD  potassium chloride SA (K-DUR,KLOR-CON) 20 MEQ tablet TAKE 1 TABLET BY MOUTH ONCE DAILY. TAKE WITH FUROSEMIDE. 08/27/14   Alycia Rossetti, MD  sodium chloride (OCEAN) 0.65 % nasal spray Place 1 spray into the nose as needed for congestion. Congestion    Historical Provider, MD  SYMBICORT 160-4.5 MCG/ACT inhaler INHALE 2 PUFFS INTO THE LUNGS TWICE DAILY. RINSE MOUTH AFTER USE. 12/17/14   Alycia Rossetti, MD  thiamine 100 MG tablet Take 1 tablet (100 mg total) by mouth daily. 10/27/13   Nita Sells, MD  TL ICON capsule TAKE 1  CAPSULE BY MOUTH ONCE DAILY. 02/28/15   Alycia Rossetti, MD   Triage Vitals: BP 163/90 mmHg  Pulse 78  Temp(Src) 98 F (36.7 C) (Oral)  Resp 20  Ht 5\' 7"  (1.702 m)  Wt 142 lb (64.411 kg)  BMI 22.24 kg/m2  SpO2 99%   Physical Exam  Constitutional: She is oriented to person, place, and time. She appears well-developed.  HENT:  Head: Normocephalic.  Dry mucous membranes   Eyes: Conjunctivae and EOM are normal. No scleral icterus.  Neck: Neck supple. No tracheal deviation present. No thyromegaly present.  Cardiovascular: Normal rate and regular rhythm.  Exam reveals no gallop and no friction rub.   No murmur heard. Pulmonary/Chest: No stridor. She has no wheezes. She has no rales. She exhibits no tenderness.  Abdominal: She exhibits no distension. There is tenderness. There is no rebound.  Mild tenderness throughout abdomen  Musculoskeletal: Normal range of motion. She exhibits no edema.  Lymphadenopathy:    She has no cervical adenopathy.  Neurological: She is oriented to person, place, and time. She exhibits normal muscle tone. Coordination normal.  Skin: Skin is warm. No rash noted. No erythema.  Psychiatric: She has a normal mood and affect. Her behavior is normal.    ED Course  Procedures (including critical care time)  DIAGNOSTIC STUDIES: Oxygen Saturation is 99% on RA, Normal by my interpretation.    COORDINATION OF CARE: 8:30 PM- Will give fluids, Zofran, Toradol, and Protonix. Will order CBC, BMP, and Lipase. Discussed treatment plan with pt at bedside and pt agreed to plan.     Labs Review Labs Reviewed - No data to display  Imaging Review No results found.   EKG Interpretation None      MDM   Final diagnoses:  None   Gastroenteritis,  tx with zofran and bentyl and follow up    The chart was scribed for me under my direct supervision.  I personally performed the history, physical, and medical decision making and all procedures in  the evaluation of this  patient.Milton Ferguson, MD 03/17/15 2259

## 2015-03-17 NOTE — ED Notes (Signed)
Pt c/o vomiting and diarrhea x one day; pt states she thinks it might be food poisoning

## 2015-03-18 ENCOUNTER — Encounter: Payer: Self-pay | Admitting: Family Medicine

## 2015-03-18 ENCOUNTER — Ambulatory Visit (HOSPITAL_COMMUNITY): Payer: Medicare Other

## 2015-03-18 ENCOUNTER — Telehealth: Payer: Self-pay | Admitting: Family Medicine

## 2015-03-18 ENCOUNTER — Other Ambulatory Visit: Payer: Self-pay | Admitting: *Deleted

## 2015-03-18 NOTE — Telephone Encounter (Signed)
Once again family is calling.  Again do not want their names mentioned.  Concerned over patient.  They want you to have her admitted some where.  Was at the ED again last night.  They say " she just lays in bed all day until she makes herself sick and has to go to ED"   I told them the doctor can not just have her sent some where without patient permission.  Again they express their fear of having her in their house and her having a gun in her possession. I told them then owner of the house needs to pursue having her evicted.  They also, again, discuss her drug problem.  All recent refills of pain meds and xanax are gone.  She tells them she has appt here at end of month (no appt seen).  I told them this is not our first conversation with all these concerns.  Exactly what do they want [place] to do.  They, the family, need to make a stand to do what they need to do legally to have her removed from their home if they have all these continuing concerns and feel unsafe.

## 2015-03-18 NOTE — Telephone Encounter (Signed)
Call and schedule pt for OV this week , seen in ED for gastroenteritis again

## 2015-03-18 NOTE — Telephone Encounter (Signed)
Appointment scheduled for 03/19/2015 at 11:45am.

## 2015-03-18 NOTE — Patient Outreach (Signed)
[facility]) Care Management  03/18/2015  Mary [name] 09-25-1951 [id number]   MD referral:  Clinical social worker already engaged with patient. Request for telephonic care coordinator to assess RN case management needs.   Outreach telephone call to patient to assess health needs and advise of [facility] care management services. Patient alert, pleasant and very responsive of health assessment questions.  Patient voices that she has not had hospital admission . States she was seen in emergency department recently because of severe nausea and inability to eat. States she received intravenous fluids, prescriptions and was released to home. States feeling okay today & that stomach was a little sore.   Medication review done with patient. Patient voices understanding of reasons she is taking medications and states she is consistent with taking meds as prescribed. States she gets medications at local pharmacy or they deliver. Currently she is getting all of medications that have been prescribed. States she has appointment with primary care in several months.   Health conditions include diabetes 2, COPD, heart failure, atrial fibrillation  and heart disease. Patient voices that she has glucometer and is checking her blood sugars twice daily. States her target blood sugar is less than 200. States A1c is checked about every 3-4 months.  States she has blood pressure monitor, scales and nebulizer. Voices that she knows to call 911 if rescue albuterol and nebulizer don't work when having COPD issues. Patient was also able to recognize yellow and red zones of heart failure and when to seek emergency medical assistance.    Patient states she does not currently feel she needs assistance with disease management for her current medical conditions.  Patient is displaying a good working knowledge of her health conditions based on current assessment.  There are no RN case management needs assessed at this  time. RN care coordinator closing out of case.  Will communicate the above to Clinical Social Worker who is active with case. Sherrin Daisy, RN BSN CCM Care Management Coordinator [facility] Care Management  [phone]  .

## 2015-03-19 ENCOUNTER — Ambulatory Visit (INDEPENDENT_AMBULATORY_CARE_PROVIDER_SITE_OTHER): Payer: Medicare Other | Admitting: Family Medicine

## 2015-03-19 ENCOUNTER — Encounter: Payer: Self-pay | Admitting: Family Medicine

## 2015-03-19 VITALS — BP 138/78 | HR 70 | Temp 97.5°F | Resp 16 | Ht 67.0 in | Wt 140.0 lb

## 2015-03-19 DIAGNOSIS — K529 Noninfective gastroenteritis and colitis, unspecified: Secondary | ICD-10-CM

## 2015-03-19 DIAGNOSIS — Z91148 Patient's other noncompliance with medication regimen for other reason: Secondary | ICD-10-CM

## 2015-03-19 DIAGNOSIS — E1141 Type 2 diabetes mellitus with diabetic mononeuropathy: Secondary | ICD-10-CM | POA: Diagnosis not present

## 2015-03-19 DIAGNOSIS — Z79899 Other long term (current) drug therapy: Secondary | ICD-10-CM | POA: Diagnosis not present

## 2015-03-19 DIAGNOSIS — F329 Major depressive disorder, single episode, unspecified: Secondary | ICD-10-CM

## 2015-03-19 DIAGNOSIS — M5136 Other intervertebral disc degeneration, lumbar region: Secondary | ICD-10-CM

## 2015-03-19 DIAGNOSIS — Z9114 Patient's other noncompliance with medication regimen: Secondary | ICD-10-CM

## 2015-03-19 DIAGNOSIS — F419 Anxiety disorder, unspecified: Secondary | ICD-10-CM | POA: Diagnosis not present

## 2015-03-19 DIAGNOSIS — F32A Depression, unspecified: Secondary | ICD-10-CM

## 2015-03-19 DIAGNOSIS — G8929 Other chronic pain: Secondary | ICD-10-CM | POA: Diagnosis not present

## 2015-03-19 MED ORDER — OXYCODONE-ACETAMINOPHEN 7.5-325 MG PO TABS: 1.0000 | ORAL_TABLET | Freq: Two times a day (BID) | ORAL | Status: DC | PRN
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
## 2015-03-19 NOTE — Assessment & Plan Note (Addendum)
Discussed compliance with medications, stroke could have been prevented per neuorology. Also note after we set up therapy she refused the services when they called to arrange  I have advised her if her drug screens show any other subtances this is breech of pain contract with me, if I continue to get calls from family and abnormal behavior she will need to find a new provider.She voiced understanding
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
## 2015-03-19 NOTE — Patient Instructions (Signed)
Referral to psychiatry Referral to Pain clinic  Decrease metformin to 1 tablet a day , because of the diarrhea REferral to stomach doctor F/U as previous

## 2015-03-19 NOTE — Assessment & Plan Note (Signed)
Due to current diarrhea, I am going to decrease metformin down to 850mg  once a day, I am not sure if this is contributing or not Continue all other meds,hopefully she is taking

## 2015-03-19 NOTE — Assessment & Plan Note (Signed)
She currently is on Percocet 7.5/325 milligrams #30 per month and going to send her to a pain clinic for further treatment as I do not feel comfortable continuing to prescribe her pain medication

## 2015-03-21 ENCOUNTER — Other Ambulatory Visit: Payer: Self-pay | Admitting: Family Medicine

## 2015-03-21 ENCOUNTER — Encounter: Payer: Self-pay | Admitting: Licensed Clinical Social Worker

## 2015-03-21 LAB — DRUG SCREEN PANEL (SERUM)

## 2015-03-21 NOTE — Telephone Encounter (Signed)
Prescription sent to pharmacy.

## 2015-03-21 NOTE — Patient Outreach (Signed)
[facility]) Care Management  [facility] Social Work  03/21/2015  JAMYLA MONTERA 1951/09/23 [id number]  Subjective:    Objective:   Current Medications:  Current Outpatient Prescriptions  Medication Sig Dispense Refill  . acetaminophen (TYLENOL) 500 MG tablet Take 500 mg by mouth every 6 (six) hours as needed for mild pain.    Marland Kitchen albuterol (PROAIR HFA) 108 (90 BASE) MCG/ACT inhaler Inhale 2 puffs into the lungs every 6 (six) hours as needed for wheezing or shortness of breath. 18 g 11  . ALPRAZolam (XANAX) 1 MG tablet TAKE ONE TABLET BY MOUTH 2 TIMES A DAY AS NEEDED. MUST LAST 30 DAYS. 60 tablet 2  . aspirin EC 81 MG tablet Take 81 mg by mouth daily.    Marland Kitchen atorvastatin (LIPITOR) 40 MG tablet Take 1 tablet (40 mg total) by mouth daily. 90 tablet 3  . dabigatran (PRADAXA) 150 MG CAPS capsule Take 1 capsule (150 mg total) by mouth 2 (two) times daily. 60 capsule 5  . dicyclomine (BENTYL) 20 MG tablet Take one every 6-8 hours for abdominal cramps 20 tablet 0  . digoxin (LANOXIN) 0.125 MG tablet Take 1 tablet (0.125 mg total) by mouth daily. 30 tablet 6  . FLUoxetine (PROZAC) 20 MG capsule TAKE (1) CAPSULE BY MOUTH EVERY DAY.TAKE WITH 40MG . 30 capsule 1  . FLUoxetine (PROZAC) 40 MG capsule Take 1 tablet with 20mg  tablet of prozac once a day (Patient taking differently: Take 60 mg by mouth daily. Take 1 tablet with 20mg  tablet of prozac once a day) 30 capsule 3  . fluticasone (FLONASE) 50 MCG/ACT nasal spray Place 2 sprays into both nostrils daily. 16 g 6  . furosemide (LASIX) 40 MG tablet Take 0.5 tablets (20 mg total) by mouth daily. Take daily. 30 tablet 2  . JANUVIA 100 MG tablet TAKE ONE TABLET BY MOUTH DAILY. 30 tablet 6  . lisinopril (PRINIVIL,ZESTRIL) 2.5 MG tablet Take 1 tablet (2.5 mg total) by mouth daily. 30 tablet 11  . metFORMIN (GLUCOPHAGE) 850 MG tablet Take 1 tablet (850 mg total) by mouth 2 (two) times daily with a meal. (Patient taking differently: Take 850 mg by  mouth daily with breakfast. ) 60 tablet 3  . metoprolol (LOPRESSOR) 50 MG tablet Take 1.5 tablets (75 mg total) by mouth 2 (two) times daily. 90 tablet 11  . omeprazole (PRILOSEC) 20 MG capsule Take 1 capsule (20 mg total) by mouth 2 (two) times daily. 60 capsule 1  . ondansetron (ZOFRAN ODT) 4 MG disintegrating tablet 4mg  ODT q4 hours prn nausea/vomit 24 tablet 0  . oxyCODONE-acetaminophen (PERCOCET) 7.5-325 MG per tablet Take 1 tablet by mouth 2 (two) times daily as needed. 30 tablet 0  . potassium chloride SA (K-DUR,KLOR-CON) 20 MEQ tablet TAKE 1 TABLET BY MOUTH ONCE DAILY. TAKE WITH FUROSEMIDE. 30 tablet 0  . sodium chloride (OCEAN) 0.65 % nasal spray Place 1 spray into the nose as needed for congestion. Congestion    . SYMBICORT 160-4.5 MCG/ACT inhaler INHALE 2 PUFFS INTO THE LUNGS TWICE DAILY. RINSE MOUTH AFTER USE. 10.2 g 3  . TL ICON capsule TAKE 1 CAPSULE BY MOUTH ONCE DAILY. 30 capsule 3   No current facility-administered medications for this visit.    Functional Status:  In your present state of health, do you have any difficulty performing the following activities: 03/18/2015 02/28/2015  Hearing? N N  Vision? N N  Difficulty concentrating or making decisions? Tempie Donning  Walking or climbing stairs? Tempie Donning  Dressing or bathing? N N  Doing errands, shopping? Y Y  Conservation officer, nature and eating ? - N  Using the Toilet? - N  In the past six months, have you accidently leaked urine? - N  Do you have problems with loss of bowel control? - N  Managing your Medications? - N  Managing your Finances? - N  Housekeeping or managing your Housekeeping? - Y    Fall/Depression Screening:  PHQ 2/9 Scores 03/18/2015 02/28/2015 01/25/2015  PHQ - 2 Score 1 2 0  PHQ- 9 Score - 6 -    Assessment:   CSW called home phone number for client on 03/21/15 but did not speak via phone with client.  CSW left phone message for client on 03/21/15 requesting client return call to [place] at [phone] to discuss current needs  of client.  CSW is awaiting return call from client to discuss current needs of client.  Plan:   Client to take medications as prescribed and to attend scheduled medical appointments.  Client to communicate as needed with Dr. Buelah Manis, primary doctor, to address medical needs of client . If client does not return call to [place] in 48 hours, then CSW will again call client home number next week to try to speak with client about current needs of client.   [name].Anyela Napierkowski MSW, LCSW Licensed Clinical Social Worker [facility] Care Management [phone].

## 2015-03-26 ENCOUNTER — Encounter: Payer: Self-pay | Admitting: Cardiovascular Disease

## 2015-03-26 ENCOUNTER — Ambulatory Visit (INDEPENDENT_AMBULATORY_CARE_PROVIDER_SITE_OTHER): Payer: Medicare Other | Admitting: Cardiovascular Disease

## 2015-03-26 VITALS — BP 112/68 | HR 82 | Ht 67.0 in | Wt 140.0 lb

## 2015-03-26 DIAGNOSIS — I4510 Unspecified right bundle-branch block: Secondary | ICD-10-CM | POA: Diagnosis not present

## 2015-03-26 DIAGNOSIS — I4891 Unspecified atrial fibrillation: Secondary | ICD-10-CM

## 2015-03-26 DIAGNOSIS — I25810 Atherosclerosis of coronary artery bypass graft(s) without angina pectoris: Secondary | ICD-10-CM

## 2015-03-26 DIAGNOSIS — I2510 Atherosclerotic heart disease of native coronary artery without angina pectoris: Secondary | ICD-10-CM

## 2015-03-26 DIAGNOSIS — Z951 Presence of aortocoronary bypass graft: Secondary | ICD-10-CM

## 2015-03-26 DIAGNOSIS — I2583 Coronary atherosclerosis due to lipid rich plaque: Secondary | ICD-10-CM

## 2015-03-26 DIAGNOSIS — E785 Hyperlipidemia, unspecified: Secondary | ICD-10-CM

## 2015-03-26 NOTE — Progress Notes (Signed)
Patient ID: Mary [name], female   DOB: 1951/08/06, 64 y.o.   MRN: [id number]     HPI: Mary [name] is a 64 y.o. female who presents to the office for a 6 month followup cardiology evaluation.   Mary [name] has a history of permanent atrial fibrillation, hypertension, DM2, COPD and long-standing prior history of tobacco use. In February 2014 she was hospitalized and felt to have diastolic congestive heart failure. She has been on low-dose ACE inhibitor. An echo Doppler in November 2013 showed an ejection fraction of 55-60% with grade 1 diastolic dysfunction, moderate mitral regurgitation, moderate tricuspid regurgitation, biatrial enlargement.   On 01/07/2014  she underwent cardiac catheterization by Dr. Gwenlyn Found after she  presented to the hospital with left-sided chest pain, associated with diaphoresis, weakness, nausea and vomiting.  Cardiac catheterization demonstrated 80% tapered calcified left main stenosis with 3 vessel CAD involving a subtotal proximal to mid LAD stenosis with TIMI 2 flow, an occluded circumflex vessel.  After the first large bifurcating obtuse marginal branch as well as 70% mid AV groove stenosis and a small-caliber occluded mid RCA.  She underwent CABG revascularization surgery on 01/09/2014 by Dr. Tharon Aquas Tright and had a LIMA placed to the LAD, a saphenous vein graft to the ramus intermediate, saphenous vein graft to the circumflex marginal vessel.  She also had application of a left atrial clip.  Preoperative ejection fraction was 30%.  In April 2015 she was on a medical regimen, consisting of Lasix 40 mg, lisinopril 2.5 mg twice a day, digoxin, 0.125 mg, metoprolol tartrate 12.5 twice a day.  An echo Doppler study on 04/17/2014 showed improvement in her Ejection fraction at 55-60%, although she had a pseudo-normal left ventricular filling pattern consistent with grade 2 diastolic dysfunction.  Mild hypokinesis of the mid, anteroseptal, basal, inferoseptal and apical septal wall.   Mitral annular calcification, mild MR, moderate LA, and mild RA dilatation with mild TR. She had been in sinus rhythm and when last seen by me July was maintaining sinus rhythm.  At that time, I discontinued her Lanoxin and further titrated her metoprolol tartrate from 12.5 twice a day to 25 mg twice a day.  Postoperatively, she had converted initially to sinus rhythm and was in normal sinus rhythm as of her July 2015 evaluation.   Since I last saw her , she underwent a nuclear perfusion study on 01/03/2015.  This revealed normal perfusion.  The study was not gated due to her atrial fibrillation.  Apparently, she had recently experienced a presyncopal episode.  She was referred for MR of her brain which raised the possibility of a small nonhemorrhagic infarct within the subcortical white matter of the right postcentral gyrus.  There also is moderate  Age advanced periventricular and subcortical T2 changes.  She is followed by Dr. Buelah Manis. She tells me she is scheduled to have an EEG later this week.   Mary [name] denies recent chest tightness.  She denies a sensation of palpitations. She quit smoking. She denies PND, orthopnea.  She has chronic right bundle branch block.  Past Medical History  Diagnosis Date  . Diabetes mellitus   . Arthritis   . Anxiety   . Chronic pain     Bacl pain, Disc L5-S1- Dr. Joya Salm in [place]  . Hyperlipidemia   . Hypertension   . Tachycardia   . Bronchial asthma   . Atrial fibrillation   . COPD (chronic obstructive pulmonary disease)   . DDD (degenerative disc disease)  Radicular symptoms  . Shortness of breath   . CHF (congestive heart failure)   . Hx of echocardiogram     Was interpreted by Dr Doylene Canard that showed an Ef in the 50-60% range with grade 1 diastolic dysfunction. she had moderate MR, biatrial enlargement, moderate TR and at that time estimated RV systolic pressure was 43 mm.  . History of stress test 06/2011    Abnormal myocardial perfusion study.     Past Surgical History  Procedure Laterality Date  . Carpal tunnel release      x 2  . Breast cyst removed    . Abdominal hysterectomy      partial  . Tubal ligation    . Tonsillectomy    . Coronary artery bypass graft N/A 01/09/2014    Procedure: CORONARY ARTERY BYPASS GRAFTING (CABG) x 3 using endoscopically harvested right saphenous vein and left internal mammary artery and closure of left atrial appendage;  Surgeon: Ivin Poot, MD;  Location: [facility];  Service: Open Heart Surgery;  Laterality: N/A;  patient has preop IA BP   . Intraoperative transesophageal echocardiogram N/A 01/09/2014    Procedure: INTRAOPERATIVE TRANSESOPHAGEAL ECHOCARDIOGRAM;  Surgeon: Ivin Poot, MD;  Location: [facility];  Service: Open Heart Surgery;  Laterality: N/A;  . Left heart catheterization with coronary angiogram N/A 01/07/2014    Procedure: LEFT HEART CATHETERIZATION WITH CORONARY ANGIOGRAM;  Surgeon: Lorretta Harp, MD;  Location: [facility] CATH LAB;  Service: Cardiovascular;  Laterality: N/A;    Allergies  Allergen Reactions  . Adhesive [Tape] Other (See Comments)    Tears skin off.   . Latex Other (See Comments)    In tape, tears skin off.  . Zocor [Simvastatin - High Dose] Nausea And Vomiting    Current Outpatient Prescriptions  Medication Sig Dispense Refill  . acetaminophen (TYLENOL) 500 MG tablet Take 500 mg by mouth every 6 (six) hours as needed for mild pain.    Marland Kitchen albuterol (PROAIR HFA) 108 (90 BASE) MCG/ACT inhaler Inhale 2 puffs into the lungs every 6 (six) hours as needed for wheezing or shortness of breath. 18 g 11  . ALPRAZolam (XANAX) 1 MG tablet TAKE ONE TABLET BY MOUTH 2 TIMES A DAY AS NEEDED. MUST LAST 30 DAYS. 60 tablet 2  . aspirin EC 81 MG tablet Take 81 mg by mouth daily.    Marland Kitchen atorvastatin (LIPITOR) 40 MG tablet Take 1 tablet (40 mg total) by mouth daily. 90 tablet 3  . dabigatran (PRADAXA) 150 MG CAPS capsule Take 1 capsule (150 mg total) by mouth 2 (two) times daily. 60  capsule 5  . dicyclomine (BENTYL) 20 MG tablet Take one every 6-8 hours for abdominal cramps 20 tablet 0  . digoxin (LANOXIN) 0.125 MG tablet Take 1 tablet (0.125 mg total) by mouth daily. 30 tablet 6  . FLUoxetine (PROZAC) 20 MG capsule TAKE (1) CAPSULE BY MOUTH EVERY DAY.TAKE WITH 40MG. 30 capsule 1  . FLUoxetine (PROZAC) 40 MG capsule TAKE ONE CAPSULE BY MOUTH DAILY WITH 20MG. 30 capsule 0  . fluticasone (FLONASE) 50 MCG/ACT nasal spray Place 2 sprays into both nostrils daily. 16 g 6  . furosemide (LASIX) 40 MG tablet Take 0.5 tablets (20 mg total) by mouth daily. Take daily. 30 tablet 2  . JANUVIA 100 MG tablet TAKE ONE TABLET BY MOUTH DAILY. 30 tablet 6  . lisinopril (PRINIVIL,ZESTRIL) 2.5 MG tablet Take 1 tablet (2.5 mg total) by mouth daily. 30 tablet 11  . metFORMIN (GLUCOPHAGE)  850 MG tablet Take 1 tablet (850 mg total) by mouth 2 (two) times daily with a meal. (Patient taking differently: Take 850 mg by mouth daily with breakfast. ) 60 tablet 3  . metoprolol (LOPRESSOR) 50 MG tablet Take 1.5 tablets (75 mg total) by mouth 2 (two) times daily. 90 tablet 11  . omeprazole (PRILOSEC) 20 MG capsule Take 1 capsule (20 mg total) by mouth 2 (two) times daily. 60 capsule 1  . ondansetron (ZOFRAN ODT) 4 MG disintegrating tablet 70m ODT q4 hours prn nausea/vomit 24 tablet 0  . oxyCODONE-acetaminophen (PERCOCET) 7.5-325 MG per tablet Take 1 tablet by mouth 2 (two) times daily as needed. 30 tablet 0  . potassium chloride SA (K-DUR,KLOR-CON) 20 MEQ tablet TAKE 1 TABLET BY MOUTH ONCE DAILY. TAKE WITH FUROSEMIDE. 30 tablet 0  . sodium chloride (OCEAN) 0.65 % nasal spray Place 1 spray into the nose as needed for congestion. Congestion    . SYMBICORT 160-4.5 MCG/ACT inhaler INHALE 2 PUFFS INTO THE LUNGS TWICE DAILY. RINSE MOUTH AFTER USE. 10.2 g 3  . TL ICON capsule TAKE 1 CAPSULE BY MOUTH ONCE DAILY. 30 capsule 3   No current facility-administered medications for this visit.    History   Social  History  . Marital Status: Divorced    Spouse Name: N/A  . Number of Children: 1  . Years of Education: 11   Occupational History  . Not on file.   Social History Main Topics  . Smoking status: Former Smoker -- 1.50 packs/day for 30 years    Types: Cigarettes    Quit date: 05/26/2013  . Smokeless tobacco: Never Used     Comment: quit 6 months ago  . Alcohol Use: No  . Drug Use: No  . Sexual Activity: Yes    Birth Control/ Protection: Surgical   Other Topics Concern  . Not on file   Social History Narrative   Lives at home with sister and sister-in-law   Caffeine use : drink 1 cup coffee in morning        Socially she is divorce. She has one deceased child and 2 grandchildren. She quit tobacco at the end of August 2014.  She now has one great grandchild. There is no alcohol use. She does not routinely exercise.  Family History  Problem Relation Age of Onset  . Diabetes Mother   . Heart disease Mother   . Diabetes Sister   . Hypertension Sister   . Hyperlipidemia Sister   . Diabetes Brother   . Heart disease Brother   . Diabetes Brother   . Heart disease Brother    ROS General: Negative; No fevers, chills, or night sweats; positive for fatigue HEENT: Negative; No changes in vision or hearing, sinus congestion, difficulty swallowing Pulmonary: Positive for mild shortness of breath; history of COPD ;No cough, wheezing, , hemoptysis Cardiovascular: Positive for palpitations.  Mild shortness of breath with activity.  No chest pressure. GI: Positive for occasional nausea, no vomiting, diarrhea, or abdominal pain GU: Negative; No dysuria, hematuria, or difficulty voiding Musculoskeletal: Negative; no myalgias, joint pain, or weakness Hematologic/Oncology: Negative; no easy bruising, bleeding Endocrine: Negative; no heat/cold intolerance; positive for diabetes Neuro: Negative; no changes in balance, headaches Skin: Negative; No rashes or skin lesions Psychiatric: positive  for anxiety depression Sleep: Negative; No snoring, daytime sleepiness, hypersomnolence, bruxism, restless legs, hypnogognic hallucinations, no cataplexy Other comprehensive 14 point system review is negative   PE BP 112/68 mmHg  Pulse 82  Ht  _0  (1.702 m)  Wt 140 lb (63.504 kg)  BMI 21.92 kg/m2   repeat blood pressure by me 120/70.\ Wt Readings from Last 3 Encounters:  03/26/15 140 lb (63.504 kg)  03/19/15 140 lb (63.504 kg)  03/17/15 142 lb (64.411 kg)   General: Alert, oriented, no distress.  Skin: normal turgor, no rashes HEENT: Normocephalic, atraumatic. Pupils round and reactive; sclera anicteric;no lid lag.  Nose without nasal septal hypertrophy Mouth/Parynx benign; Mallinpatti scale 2/3 Neck: No JVD, no carotid bruits Chest wall: Mild tenderness over the sternotomy site with suggestion of very minimal separation of the upper sternum. Lungs: Decreased breath sound segmenter long-standing tobacco use. no wheezing or rales Heart: PMI is not displaced.  Rhythm is irregularly irregular with a rate of approximately 75bpm; s1 s2 normal 1/6 systolic murmur; no diastolic murmur;  No rubs thrills or heaves Abdomen: soft, nontender; no hepatosplenomehaly, BS+; abdominal aorta nontender and not dilated by palpation. Pulses 2+ Extremities:  leg edema no clubbing cyanosis, Homan's sign negative  Neurologic: grossly nonfocal Psychologic: Normal affect and mood  ECG (independently read by me):  Atrial fibrillation at 75 bpm.  Right bundle branch block with repolarization changes.  December 2015ECG (independently read by me) atrial fibrillation with a ventricular rate at 10 2 bpm.  Right bundle-branch block with repolarization changes.  Nonspecific ST changes.  09/04/2014 ECG (independently read by me): atrial fibrillation with ventricular response at 1 20 bpm with right bundle branch block, repolarization changes.  04/25/2014 ECG (independently read by me): Normal sinus rhythm at 63  beats per minute.  Recommend followup with repolarization changes.  Prior April 2015 ECG: Normal sinus rhythm with right bundle branch block and repolarization changes.  Prior 10/04/2013 ECG: Atrial fibrillation at a rate At approximately 100-120 beats per minute. Nonspecific ST changes.  LABS:  BMP Latest Ref Rng 03/17/2015 02/20/2015 02/17/2015  Glucose 65 - 99 mg/dL 152(H) 150(H) 137(H)  BUN 6 - 20 mg/dL _1 Creatinine 0.44 - 1.00 mg/dL 0.53 0.48(L) 0.60  Sodium 135 - 145 mmol/L 135 139 136  Potassium 3.5 - 5.1 mmol/L 3.8 3.2(L) 3.2(L)  Chloride 101 - 111 mmol/L 96(L) 102 100  CO2 22 - 32 mmol/L _2 Calcium 8.9 - 10.3 mg/dL 9.6 9.5 9.1   Hepatic Function Latest Ref Rng 03/17/2015 02/20/2015 02/17/2015  Total Protein 6.5 - 8.1 g/dL 7.6 6.3 7.1  Albumin 3.5 - 5.0 g/dL 4.2 3.7 4.0  AST 15 - 41 U/L _3 ALT 14 - 54 U/L 12(L) 8 12  Alk Phosphatase 38 - 126 U/L 59 47 49  Total Bilirubin 0.3 - 1.2 mg/dL 0.9 0.4 0.6  Bilirubin, Direct 0.0 - 0.5 mg/dL - - -   CBC Latest Ref Rng 03/17/2015 02/20/2015 02/17/2015  WBC 4.0 - 10.5 K/uL 13.5(H) 12.0(H) 13.4(H)  Hemoglobin 12.0 - 15.0 g/dL 11.2(L) 11.1(L) 11.9(L)  Hematocrit 36.0 - 46.0 % 34.8(L) 33.5(L) 36.8  Platelets 150 - 400 K/uL 285 206 207   Lab Results  Component Value Date   MCV 92.8 03/17/2015   MCV 92.3 02/20/2015   MCV 91.5 02/17/2015   Lab Results  Component Value Date   TSH 0.698 05/15/2014    Lab Results  Component Value Date   HGBA1C 8.6* 01/25/2015   Lipid Panel     Component Value Date/Time   CHOL 186 09/28/2014 1115   TRIG 144 09/28/2014 1115   HDL 55 09/28/2014 1115   CHOLHDL 3.4 09/28/2014 1115  VLDL 29 09/28/2014 1115   LDLCALC 102* 09/28/2014 1115   LDLDIRECT 66 04/14/2012 1200    RADIOLOGY: No results found.    ASSESSMENT AND PLAN:  Mary [name] is a 64 year old female who has a history of permanent atrial fibrillation.  She also has a history of significant prior tobacco use, but  fortunately she has quit smoking.  On 01/09/2014 she underwent CABG surgery for severe multivessel CAD, including left main stenosis.  At that time, she did have a left atrial clip.  Preoperatively, her ejection fraction was 30%.  Postoperatively, her ejection fraction had improved to 55-60%.  Following her surgery, she initially was not restarted on Pradaxa since she was maintaining sinus rhythm.   She developed recurrent AF has since been on chronic Pradaxa 150 mg twice a day. Her blood pressure today is controlled on metoprolol 75 mg twice a day lisinopril 2.5 mg, Lasix 20 mg daily.  Her ventricular rate is controlled and she also is on Lanoxin 0.125 mg.  She does not have active wheezing on exam and continues to be on Symbicort 160/4.5 g  In addition to an albuterol inhaler as needed.  She is diabetic, on Januvia 100 mg daily.  She is tolerating atorvastatin 40 mg for hyperlipidemia.  32 fibrillation.  Rate is controlled.  She has chronic right bundle branch block.  I reviewed her most recent MR of her brain which raised the possibility of a recent nonhemorrhagic infarct involving the subcortical white matter of the right postcentral gyrus.  Cardiac wise she appears stable. She will be undergoing an EKG later this week. I will see her in 6 months for cardiology reevaluation.   Time spent: 25 minutes   Troy Sine, MD, [facility]  03/26/2015 8:25 PM

## 2015-03-26 NOTE — Patient Instructions (Signed)
Your physician wants you to follow-up in: 6 months or sooner if needed. You will receive a reminder letter in the mail two months in advance. If you don't receive a letter, please call our office to schedule the follow-up appointment. 

## 2015-03-27 ENCOUNTER — Encounter: Payer: Self-pay | Admitting: Family Medicine

## 2015-03-28 ENCOUNTER — Ambulatory Visit (HOSPITAL_COMMUNITY)
Admission: RE | Admit: 2015-03-28 | Discharge: 2015-03-28 | Disposition: A | Discharge status: Home / Self Care | Payer: Medicare Other | Source: Ambulatory Visit | Attending: Neurology | Admitting: Neurology

## 2015-03-28 DIAGNOSIS — R404 Transient alteration of awareness: Secondary | ICD-10-CM | POA: Insufficient documentation

## 2015-03-28 DIAGNOSIS — R569 Unspecified convulsions: Secondary | ICD-10-CM | POA: Diagnosis not present

## 2015-03-28 NOTE — Progress Notes (Signed)
EEG Completed; Results Pending  

## 2015-03-28 NOTE — Procedures (Signed)
Mary A. Merlene Laughter, MD     [email or web address]           HISTORY: The patient is a 64 year old female who presents with altered mental status and spells suspicious for seizures.  MEDICATIONS: Scheduled Meds: Continuous Infusions: PRN Meds:.  Prior to Admission medications   Medication Sig Start Date End Date Taking? Authorizing Provider  acetaminophen (TYLENOL) 500 MG tablet Take 500 mg by mouth every 6 (six) hours as needed for mild pain.    Historical Provider, MD  albuterol (PROAIR HFA) 108 (90 BASE) MCG/ACT inhaler Inhale 2 puffs into the lungs every 6 (six) hours as needed for wheezing or shortness of breath. 08/14/14   Alycia Rossetti, MD  ALPRAZolam (XANAX) 1 MG tablet TAKE ONE TABLET BY MOUTH 2 TIMES A DAY AS NEEDED. MUST LAST 30 DAYS. 02/20/15   Alycia Rossetti, MD  aspirin EC 81 MG tablet Take 81 mg by mouth daily.    Historical Provider, MD  atorvastatin (LIPITOR) 40 MG tablet Take 1 tablet (40 mg total) by mouth daily. 10/04/14   Troy Sine, MD  dabigatran (PRADAXA) 150 MG CAPS capsule Take 1 capsule (150 mg total) by mouth 2 (two) times daily. 09/14/14   Troy Sine, MD  dicyclomine (BENTYL) 20 MG tablet Take one every 6-8 hours for abdominal cramps 03/17/15   Milton Ferguson, MD  digoxin (LANOXIN) 0.125 MG tablet Take 1 tablet (0.125 mg total) by mouth daily. 09/04/14   Troy Sine, MD  FLUoxetine (PROZAC) 20 MG capsule TAKE (1) CAPSULE BY MOUTH EVERY DAY.TAKE WITH 40MG . 02/15/15   Alycia Rossetti, MD  FLUoxetine (PROZAC) 40 MG capsule TAKE ONE CAPSULE BY MOUTH DAILY WITH 20MG . 03/21/15   Alycia Rossetti, MD  fluticasone (FLONASE) 50 MCG/ACT nasal spray Place 2 sprays into both nostrils daily. 01/25/15   Alycia Rossetti, MD  furosemide (LASIX) 40 MG tablet Take 0.5 tablets (20 mg total) by mouth daily. Take daily. 09/04/14   Troy Sine, MD  JANUVIA 100 MG tablet TAKE ONE TABLET BY MOUTH DAILY. 08/14/14   Alycia Rossetti, MD  lisinopril  (PRINIVIL,ZESTRIL) 2.5 MG tablet Take 1 tablet (2.5 mg total) by mouth daily. 10/15/14   Troy Sine, MD  metFORMIN (GLUCOPHAGE) 850 MG tablet Take 1 tablet (850 mg total) by mouth 2 (two) times daily with a meal. Patient taking differently: Take 850 mg by mouth daily with breakfast.  01/28/15   Alycia Rossetti, MD  metoprolol (LOPRESSOR) 50 MG tablet Take 1.5 tablets (75 mg total) by mouth 2 (two) times daily. 10/04/14   Troy Sine, MD  omeprazole (PRILOSEC) 20 MG capsule Take 1 capsule (20 mg total) by mouth 2 (two) times daily. 02/16/15   Tanna Furry, MD  ondansetron (ZOFRAN ODT) 4 MG disintegrating tablet 4mg  ODT q4 hours prn nausea/vomit 03/17/15   Milton Ferguson, MD  oxyCODONE-acetaminophen (PERCOCET) 7.5-325 MG per tablet Take 1 tablet by mouth 2 (two) times daily as needed. 03/19/15   Alycia Rossetti, MD  potassium chloride SA (K-DUR,KLOR-CON) 20 MEQ tablet TAKE 1 TABLET BY MOUTH ONCE DAILY. TAKE WITH FUROSEMIDE. 08/27/14   Alycia Rossetti, MD  sodium chloride (OCEAN) 0.65 % nasal spray Place 1 spray into the nose as needed for congestion. Congestion    Historical Provider, MD  SYMBICORT 160-4.5 MCG/ACT inhaler INHALE 2 PUFFS INTO THE LUNGS TWICE DAILY. RINSE MOUTH AFTER USE. 12/17/14   Alycia Rossetti, MD  TL ICON capsule  TAKE 1 CAPSULE BY MOUTH ONCE DAILY. 02/28/15   Alycia Rossetti, MD      ANALYSIS: A 16 channel recording using standard 10 20 measurements is conducted for 24 minutes. There is a well-formed posterior dominant rhythm that gets as high as 12 Hz. There is beta activity observed in the frontal areas. Awake and sleep activities are observed. K complexes and sleep spindles are observed. There are transient temporal sharps seen bilaterally more prominent on the left side thought to be nonepileptic.Photic stimulation and hyperventilation are not carried out. There are no focal or lateral slowing. There is no epileptiform activity was observed.   IMPRESSION: 1. Insulin normal  recording of awake and sleep states.      Jalisia Puchalski A. Merlene [name], M.D.  Diplomate, Tax adviser of Psychiatry and Neurology ( Neurology).

## 2015-03-29 ENCOUNTER — Telehealth: Payer: Self-pay

## 2015-03-29 NOTE — Telephone Encounter (Signed)
Calling to inform patient EEG was normal, however contact information listed is not the correct contact for patient.

## 2015-04-03 ENCOUNTER — Other Ambulatory Visit: Payer: Self-pay | Admitting: Cardiovascular Disease

## 2015-04-03 NOTE — Telephone Encounter (Signed)
Rx(s) sent to pharmacy electronically.  

## 2015-04-09 ENCOUNTER — Telehealth: Payer: Self-pay | Admitting: *Deleted

## 2015-04-09 NOTE — Telephone Encounter (Signed)
Received call from Dr. Francesco Runner in regards to patient consult for pain management.   States that he will gladly accept consult, but from reviewing chart notes, he is uncomfortable with prescribing narcotics.   Advised that non-narcotic pain management strategies will be discussed with patient.   MD made aware.

## 2015-04-15 ENCOUNTER — Telehealth: Payer: Self-pay | Admitting: Family Medicine

## 2015-04-15 ENCOUNTER — Other Ambulatory Visit: Payer: Self-pay | Admitting: Cardiovascular Disease

## 2015-04-15 MED ORDER — METOPROLOL TARTRATE 50 MG PO TABS: 75.0000 mg | ORAL_TABLET | Freq: Two times a day (BID) | ORAL | Status: DC

## 2015-04-15 NOTE — Telephone Encounter (Signed)
noted 

## 2015-04-15 NOTE — Telephone Encounter (Signed)
Attempted to call patient. No ring, busy/line disconnected tone. Unable to leave message.   Rx(s) sent to pharmacy electronically for dose on file

## 2015-04-15 NOTE — Telephone Encounter (Signed)
Attempted to call patient. No ring, busy/line disconnected tone. Unable to leave message

## 2015-04-15 NOTE — Telephone Encounter (Signed)
°  1. Which medications need to be refilled? Metoprolol-new prescription because dosage was changed-please call today  2. Which pharmacy is medication to be sent to? [facility]-[phone]  3. Do they need a 30 day or 90 day supply? #90 for 1 month  4. Would they like a call back once the medication has been sent to the pharmacy? yes

## 2015-04-15 NOTE — Telephone Encounter (Signed)
Patient called in to ask if Dr. Buelah Manis would refill her medication.  She states that she moved into a different apartment and she now lives by herself.   She states that Dr. Buelah Manis was going to try and wing her off of the xanax however patient doesn't feel like she can stop taking the medication at this time.  ALPRAZolam (XANAX) 1 MG tablet  oxyCODONE-acetaminophen (PERCOCET) 7.5-325 MG per tablet

## 2015-04-15 NOTE — Telephone Encounter (Signed)
Call placed to patient.   Advised that no further prescriptions are to be given to patient for either Xanax or Percocet.   Advised that reasoning behind denial will not be discussed over the phone again, and if patient has further issues, she can schedule OV to discuss with MD.   Appointment scheduled for Wed, 04/17/2015 @ 12:30pm.

## 2015-04-17 ENCOUNTER — Encounter: Payer: Self-pay | Admitting: Family Medicine

## 2015-04-17 ENCOUNTER — Ambulatory Visit (INDEPENDENT_AMBULATORY_CARE_PROVIDER_SITE_OTHER): Payer: Medicare Other | Admitting: Family Medicine

## 2015-04-17 VITALS — BP 140/78 | HR 82 | Temp 97.9°F | Resp 16 | Ht 67.0 in | Wt 135.0 lb

## 2015-04-17 DIAGNOSIS — Z91148 Patient's other noncompliance with medication regimen for other reason: Secondary | ICD-10-CM

## 2015-04-17 DIAGNOSIS — Z9114 Patient's other noncompliance with medication regimen: Secondary | ICD-10-CM

## 2015-04-17 DIAGNOSIS — F419 Anxiety disorder, unspecified: Secondary | ICD-10-CM | POA: Diagnosis not present

## 2015-04-17 MED ORDER — BUSPIRONE HCL 7.5 MG PO TABS: ORAL_TABLET | ORAL | Status: DC

## 2015-04-17 NOTE — Patient Instructions (Addendum)
Psychiatry appointment   I will not prescribe any pain medication or xanax for you  Take the buspar in place of xanax F/U as previous

## 2015-04-18 NOTE — Assessment & Plan Note (Signed)
Per notes in chart, multiple failed drug screens, erratic behavior and family involvement,  I will no longer provide benzos or narcotic medications to her. She declined pain clinic referral to [facility] appt pending, advised her to follow the letter with the tapering schedule for her xanax to avoid withdrawel, honestly I dont she will do this, though her drug screens did not suggest she was even taking regularly anyway. I will start buspar in its placed 7.5mg  BID, as she asked for something to take its place

## 2015-04-18 NOTE — Progress Notes (Signed)
Patient ID: Mary [name], female   DOB: September 09, 1951, 64 y.o.   MRN: [id number]   Subjective:    Patient ID: Mary [name], female    DOB: 08-17-1951, 64 y.o.   MRN: [id number]  Patient presents for Medication Management   Pt here to discuss recent medication change, after 3 failed drug screens and I had already decreased her to 30 tabs of percocet, I advised her I will no longer fill this or xanax. Her xanax was also negative in her UDS, though she kept asking for higher doses and states she was taking them regulary- see previous notes and explanations. I provided her with a tapering off schedule for her xanax, this was mailed to her and she was called. She received the letter but did not follow the instructions and is now asking for a refill on meds She has been referred to psychiatry and pain clinic- pain clinic did not acccept her, states she cant drive to [place]:  GEN- denies fatigue, fever, weight loss,weakness, recent illness HEENT- denies eye drainage, change in vision, nasal discharge, CVS- denies chest pain, palpitations RESP- denies SOB, cough, wheeze MSK- + joint pain, muscle aches, injury Neuro- denies headache, dizziness, syncope, seizure activity       Objective:    BP 140/78 mmHg  Pulse 82  Temp(Src) 97.9 F (36.6 C) (Oral)  Resp 16  Ht 5\' 7"  (1.702 m)  Wt 135 lb (61.236 kg)  BMI 21.14 kg/m2 GEN- NAD, alert and oriented x3 Psych- normal affect and mood, kept going on tangets about her familly and how they dont like her and no one will help her       Assessment & Plan:      Problem List Items Addressed This Visit    None      Note: This dictation was prepared with Dragon dictation along with smaller phrase technology. Any transcriptional errors that result from this process are unintentional.

## 2015-04-19 ENCOUNTER — Encounter: Payer: Self-pay | Admitting: Family Medicine

## 2015-04-20 ENCOUNTER — Other Ambulatory Visit: Payer: Self-pay | Admitting: Family Medicine

## 2015-04-22 NOTE — Telephone Encounter (Signed)
Medication refilled per protocol. 

## 2015-04-24 ENCOUNTER — Telehealth: Payer: Self-pay | Admitting: Family Medicine

## 2015-04-24 NOTE — Telephone Encounter (Signed)
[phone] Pt called and had some questions in regards to a referral to a pain clinic.

## 2015-04-30 ENCOUNTER — Other Ambulatory Visit: Payer: Self-pay | Admitting: Cardiovascular Disease

## 2015-05-01 NOTE — Telephone Encounter (Signed)
lmtrc

## 2015-05-06 ENCOUNTER — Other Ambulatory Visit: Payer: Self-pay | Admitting: Licensed Clinical Social Worker

## 2015-05-06 ENCOUNTER — Telehealth: Payer: Self-pay | Admitting: Family Medicine

## 2015-05-06 DIAGNOSIS — M5136 Other intervertebral disc degeneration, lumbar region: Secondary | ICD-10-CM

## 2015-05-06 DIAGNOSIS — G8929 Other chronic pain: Secondary | ICD-10-CM

## 2015-05-06 NOTE — Telephone Encounter (Signed)
[place] with [facility] called regarding this pt stating that she is having some withdrawal symptoms of nausea, night sweats, decrease sleep and diarrhea. He also states that she would like to see Dr Wallis Bamberg out in [place] he is a Dr out there at a pain clinic. She has a friend that goes there and that friend will be able to provide her with transportation to her apts.

## 2015-05-06 NOTE — Patient Outreach (Signed)
Assessment: CSW called [place] on 05/06/15 and left phone message for [place] Six, RN, regarding needs of client.  In phone message left for RN by CSW, CSW informed RN that client was having night sweats, was having nausa, diarrhea and having difficulty sleeping. In phone message left for RN by CSW, CSW also informed Margreta Journey that client said she would be interested in being referred to [facility] (Dr. Elta Guadeloupe Philips) in [place]. Client has a friend who attends that Pain Clinic and [place] said that if she were referred to that particular Pain Clinic, she would be able to get her friend to drive her [place]) to and from appointments at that particular [facility]. CSW left phone message for Asencion Partridge, RN at Dr. Dorian Heckle practice on 05/06/15 sharing above information with Margreta Journey Six, RN.  Plan: Client to cooperate with medical care provision through Dr. Buelah Manis. Client to take medications as prescribed. CSW to call client in two weeks to assess needs of client at that time.  [name].Gee Habig MSW, LCSW Licensed Clinical Social Worker [facility] Care Management [phone]

## 2015-05-06 NOTE — Telephone Encounter (Signed)
MD please advise

## 2015-05-06 NOTE — Patient Outreach (Signed)
Assessment:  CSW completed chart review on client on 05/06/15.  Client has communicated numerous times with Dr. Buelah Manis, primary care doctor, regarding current needs of client. Dr. Buelah Manis has documented that client has failed a drug screen test 3 times.  Dr. Buelah Manis documented that she will no longer prescribe xanax for client.  Dr. Buelah Manis referred client to pain clinic in [place], [place].  Patient had declined going to that particular pain clinic.  Dr. Buelah Manis made a referral for client for psychiatric care/support. Psychiatric appointment for client is pending (per notes from Dr. Buelah Manis).  CSW called client home phone number on 05/06/15.  CSW verified identity of client.  CSW spoke via phone with client on 05/06/15. Client spoke of dysfunction between client and her sister. She said her family has called Dr. Dorian Heckle office previoulsy to discuss needs of client.  CSW and client spoke of psychiatric referral Dr. Buelah Manis had made for client. Client said: "Dr Buelah Manis made psychiatric referral for client to psychiatrist in [place].  Client said she is not selling her medications. Client denied homicidal or suicidal ideations. Client spoke of pain clinic referral.  Client said that her brother died about 4 years ago and she had lived previously with her sister for a few months. Client said she was trying to seek help of Dr. Buelah Manis in referral to a new pain clinic.  She said she lived in apartment complex and had good, friendly neighbors.  She said she did not want to be involved in family drama or situations involving arguments with family.  She said she had heart attack in 2015.  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  She said she does not sleep well.  She said she is sweating at night, is nauseous, and does not sleep well.  She said she has back pain.  She has some diarrhea.  She said she has atrial fibrillation.  She said that Dr.[place] had prescribed another anxiety medication for client ; but client said  that medication is not working very well. She said she had been on xanax for years.   Dr. Buelah Manis had given client information on tapering off xanax usage by client.  Client said she is scheduled to see Dr.[place] for routine appointment in three months.  CSW thanked client for phone conversation on 05/06/15.   Plan: Client to take medications as prescribed and to attend scheduled medical appointments. Client to communicate with Dr. Buelah Manis as needed to discuss medical needs of client. CSW to call client in two weeks to assess needs of client at that time.   [name].Mary [name] MSW, LCSW Licensed Clinical Social Worker [facility] Care Management [phone]

## 2015-05-06 NOTE — Telephone Encounter (Signed)
I am okay with referral to pain clinic

## 2015-05-07 NOTE — Telephone Encounter (Signed)
Referral to pain clinic orders placed.   Call placed to [place] made aware via VM.

## 2015-05-07 NOTE — Addendum Note (Signed)
Addended by: Sheral Flow on: 05/07/2015 08:02 AM   Modules accepted: Orders

## 2015-05-08 ENCOUNTER — Telehealth: Payer: Self-pay | Admitting: Family Medicine

## 2015-05-08 NOTE — Telephone Encounter (Signed)
[phone]  Pt would like to have something for nausea and diarrhea. [employer]

## 2015-05-08 NOTE — Telephone Encounter (Signed)
Call placed to patient.   Reports that she has had x1 day of diarrhea with nausea and vomiting. Denies abdominal pain, fever or chills. Does report malaise and fatigue.   Reports that she believes this is withdrawal from pain and anxiety medications.   Advised to increase fluids and rest. Advised that she can try OTC Imodium per package directions.   Also advised that if Sx persist until Friday, to contact office for further recommendations.

## 2015-05-10 ENCOUNTER — Other Ambulatory Visit: Payer: Self-pay | Admitting: Family Medicine

## 2015-05-15 ENCOUNTER — Telehealth: Payer: Self-pay | Admitting: Family Medicine

## 2015-05-15 NOTE — Telephone Encounter (Signed)
Patient is calling to say that she does not want the pain doc that she has been referred to, would like a call back regarding this  [phone]

## 2015-05-20 ENCOUNTER — Other Ambulatory Visit: Payer: Self-pay | Admitting: Licensed Clinical Social Worker

## 2015-05-20 ENCOUNTER — Other Ambulatory Visit: Payer: Self-pay | Admitting: Family Medicine

## 2015-05-20 NOTE — Patient Outreach (Signed)
Assessment:  CSW called client on 05/20/15.  CSW verified identity of client.  CSW and Mary [name] spoke of her desire to be referred to pain clinic in [place], [place]. Client is currently waiting to see if she will be accepted as patient at pain clinic in [place], [place]. Doctor at pain clinic in [place], [place] is Dr. Nicholaus Bloom. Mary [name] hopes she can be accepted as patient of Dr. Hardin Negus at pain clinic in [place], [place]. She said she has to care for herself and ensure she allows time to rest and relax. She said she is taking her medications as prescribed.  Mary [name] said she has a sister who sometimes causes conflict for client.  For example, Mary [name] said her sister called primary doctor office of client and gave office representative some information on client that was not correct. Mary [name] is trying to attend appointments as scheduled.  CSW informed Mary [name] that it may take a few weeks to see if she is accepted as a patient of Dr. Hardin Negus at pain clinic in [place]. CSW encouraged Mary [name] to call CSW at [phone] as needed to discuss social work needs of client.   Plan: Client to take medications as prescribed and attend scheduled medical appointments. Client to take time to allow herself to rest and relax as needed to help her manage stress/anxiety. CSW to call client in three weeks to assess needs of client.  Mary [name].Gavriel Holzhauer MSW, LCSW Licensed Clinical Social Worker [facility] Care Management [phone]

## 2015-05-20 NOTE — Telephone Encounter (Signed)
Medication filled x1 with no refills.   Requires office visit before any further refills can be given.  

## 2015-05-31 ENCOUNTER — Other Ambulatory Visit: Payer: Self-pay | Admitting: Licensed Clinical Social Worker

## 2015-05-31 NOTE — Patient Outreach (Signed)
Assessment: CSW received call from client on 05/31/15.   CSW verified identity of client.  CSW and [place] spoke of current needs of client. Mary [name] said she had still not heard from office of Dr. Hardin Negus in [place], [place] regarding her referral to Dr. Hardin Negus for pain management. She said she had called office of Dr. Buelah Manis several times to seek update information on pain clinic referral for client.  CSW has also called office of Dr. Buelah Manis several weeks ago and reported to LPN the symptoms client is facing currently. Client said she has periodic nausea, and has sweating at night. Client said she had no pain medication at present to utilize.  Mary [name] said she had lost several family members in the past 4 years. She said she does not want to harm self or others. She just hopes she can be accepted for pain management support through Dr. Hardin Negus office in [place] (pain clinic).  CSW invited client to call CSW as needed for social work support at [phone].  Client said she appreciated phone call and said she will call office of Dr. Hardin Negus next Monday to again inquire about status of her pain clinic referral to Dr. Hardin Negus.  Plan: Client to communicate with Dr. Buelah Manis as needed to discuss medical needs of client. CSW to call client in two weeks to assess needs of client at that time.   Mary [name].Mary [name] MSW, LCSW Licensed Clinical Social Worker [facility] Care Management [phone]

## 2015-06-04 ENCOUNTER — Other Ambulatory Visit: Payer: Self-pay | Admitting: Family Medicine

## 2015-06-04 NOTE — Telephone Encounter (Signed)
Refill appropriate and filled per protocol x1.

## 2015-06-06 ENCOUNTER — Telehealth: Payer: Self-pay | Admitting: Family Medicine

## 2015-06-06 NOTE — Telephone Encounter (Signed)
Patient needs to be seen.

## 2015-06-06 NOTE — Telephone Encounter (Signed)
Patient would like to speak to you regarding getting something called in to help her sleep at night  [phone]  [facility]

## 2015-06-20 ENCOUNTER — Other Ambulatory Visit: Payer: Self-pay | Admitting: Family Medicine

## 2015-06-20 ENCOUNTER — Other Ambulatory Visit: Payer: Self-pay | Admitting: Cardiovascular Disease

## 2015-06-20 NOTE — Telephone Encounter (Signed)
Prescription sent to pharmacy.

## 2015-06-20 NOTE — Telephone Encounter (Signed)
REFILL 

## 2015-07-01 ENCOUNTER — Encounter: Payer: Self-pay | Admitting: Family Medicine

## 2015-07-08 ENCOUNTER — Ambulatory Visit: Payer: Medicare Other | Admitting: Neurology

## 2015-07-19 ENCOUNTER — Other Ambulatory Visit: Payer: Self-pay | Admitting: Family Medicine

## 2015-07-19 NOTE — Telephone Encounter (Signed)
Medication filled x1 with no refills.  

## 2015-07-26 ENCOUNTER — Other Ambulatory Visit: Payer: Self-pay | Admitting: Cardiovascular Disease

## 2015-07-26 NOTE — Telephone Encounter (Signed)
Rx(s) sent to pharmacy electronically.  

## 2015-08-07 ENCOUNTER — Other Ambulatory Visit: Payer: Self-pay | Admitting: Licensed Clinical Social Worker

## 2015-08-07 NOTE — Patient Outreach (Signed)
Assessment:  CSW called client on 08/07/15.  CSW spoke via phone with client on 08/07/15. CSW verified identity of client.  CSW and client spoke of current needs of client.  Client said that Dr. Buelah Manis was currently following client for her medical needs.  Client said she planned to switch primary care doctors. She plans to begin seeing Dr. Delphina Cahill as her primary doctor soon. Client said she has appointment with Dr. Delphina Cahill on September 18, 2015. Client is still currenlty receiving medical care with Dr. Buelah Manis.  Client said that Dr. Buelah Manis had previously referred client to Dr. Myles Rosenthal in [place], [place] (Pain Clinic in [place], [place]).  Client is currently waiting for phone call from Pain Clinic in [place] regarding client referral to that agency. Client said that she has her prescribed medications.  She said she has arthritis and diabetic pain in her feet. .She has difficulty sleeping.  She said she is eating adequately. She feels that her weight is stable.  She is able to drive her car to needed appointments. She has a friend who helps her with transport for client for client's appointments in [place].  CSW and client completed updates for client Nutritional assessment and for client Quality of Life Assessment.  CSW and client reviewed care plan for client.  CSW encouraged client to call CSW as needed at [phone].  CSW thanked client for phone call on 08/07/15.  Plan: Client to take medications as prescribed and attend scheduled medical appointments.  CSW to call client in 4 weeks to assess needs of client at that time. Client to communicate with Dr. Buelah Manis as needed to address medical needs of client.  [name].Jalyn Dutta MSW, LCSW Licensed Clinical Social Worker [facility] Care Management [phone]

## 2015-08-19 ENCOUNTER — Other Ambulatory Visit: Payer: Self-pay | Admitting: Family Medicine

## 2015-08-19 NOTE — Telephone Encounter (Signed)
Refill request denied.   Patient is no longer seen at [facility].

## 2015-08-19 NOTE — Telephone Encounter (Signed)
Refill request denied.   Patient is no longer under [facility].

## 2015-08-20 ENCOUNTER — Other Ambulatory Visit: Payer: Self-pay | Admitting: Family Medicine

## 2015-08-20 NOTE — Telephone Encounter (Signed)
Patient has been discharged from practice.  Medication refill denied 

## 2015-08-21 ENCOUNTER — Other Ambulatory Visit: Payer: Self-pay | Admitting: Family Medicine

## 2015-08-21 NOTE — Telephone Encounter (Signed)
Patient has been discharged from practice.  Medication refill denied 

## 2015-08-23 ENCOUNTER — Other Ambulatory Visit: Payer: Self-pay | Admitting: Family Medicine

## 2015-08-23 NOTE — Telephone Encounter (Signed)
Patient has been discharged from practice.  Medication refill denied 

## 2015-08-27 ENCOUNTER — Other Ambulatory Visit: Payer: Self-pay | Admitting: Family Medicine

## 2015-08-28 ENCOUNTER — Other Ambulatory Visit: Payer: Self-pay | Admitting: Family Medicine

## 2015-08-28 NOTE — Telephone Encounter (Signed)
Refill request denied.   Patient has been dismissed.

## 2015-08-28 NOTE — Telephone Encounter (Signed)
Patient has been dismissed from practice.   Refills denied.

## 2015-09-09 ENCOUNTER — Encounter: Payer: Self-pay | Admitting: Licensed Clinical Social Worker

## 2015-09-09 ENCOUNTER — Other Ambulatory Visit: Payer: Self-pay | Admitting: Licensed Clinical Social Worker

## 2015-09-09 NOTE — Patient Outreach (Signed)
Assessment:  CSW called client home phone number on 09/09/15 and spoke via phone with client on 11/14/.16. CSW verified identity of client. CSW and client spoke of client needs. Client said she is switching medical doctors. She said she has upcoming appointment with Dr Wende Neighbors in [place], [place] on 09/18/15. She also said she has appointment with Dr. Claiborne Billings, cardiologist, on 09/11/15.  She said she is still hoping to be accepted at Pain Clinic in [place], [place] (Dr. Hardin Negus).  She has talked with Dr. Buelah Manis several times about this referral.  She said she has her prescribed medications.  She said she is trying to take medications as prescribed.  She said she has little family support.  She said she can drive herself to needed medical appointments. She said she had been on prescribed dosage of Prozac and Buspiron but needs refills on these prescribed medications.  CSW encouraged client on 09/09/15 for her to speak with Dr. .[place] Crane on client appointment with Dr. [place] Crane on 09/18/15 about Prozac and Buspiron needs for client. Clientplans to also speak with Dr. [place] Crane about her current symptoms of depression and anxiety. CSW spoke with client about counseling needs of client.  Client said she cannot afford to see psychiatrist at this time (cannot pay copay to psychiatrist).  She said: " I am not crazy and I am not stupid."  She said she did not feel that she needed counseling support from CSW or anyone else at this time.  She said she still needs to see pain specialist. Client plans to talk with Dr. [place] Crane on 09/18/15 appointment about pain issues for client.  CSW talked with client about fact that she has met her care plan goals.CSW informed client that she had met her care plan goals and thus CSW would discharge client from [facility] services on 09/09/15.   Client agreed to this plan.  CSW reminded client that she could speak with her primary doctor in the future if she felt she needed to be re-referred to [facility] program support  at that time. Client was appreciative of [facility] CSW support over past few months.  Plan: CSW is discharging client from [facility] on 09/09/15 since client has met care plan goals for client. CSW to inform Lurline Del that [place] [facility] discharged client from [facility] CSW services on 09/09/15. CSW to fax physician case closure letter regarding client to Dr. Buelah Manis, current primary care doctor for client.  [name].Odalys Win MSW, LCSW Licensed Clinical Social Worker [facility] Care Management [phone]

## 2015-09-11 ENCOUNTER — Ambulatory Visit (INDEPENDENT_AMBULATORY_CARE_PROVIDER_SITE_OTHER): Payer: Medicare Other | Admitting: Cardiovascular Disease

## 2015-09-11 VITALS — BP 120/58 | HR 69 | Ht 67.0 in | Wt 138.4 lb

## 2015-09-11 DIAGNOSIS — I10 Essential (primary) hypertension: Secondary | ICD-10-CM | POA: Diagnosis not present

## 2015-09-11 DIAGNOSIS — Z9114 Patient's other noncompliance with medication regimen: Secondary | ICD-10-CM

## 2015-09-11 DIAGNOSIS — Z951 Presence of aortocoronary bypass graft: Secondary | ICD-10-CM

## 2015-09-11 DIAGNOSIS — Z91148 Patient's other noncompliance with medication regimen for other reason: Secondary | ICD-10-CM

## 2015-09-11 DIAGNOSIS — I4891 Unspecified atrial fibrillation: Secondary | ICD-10-CM | POA: Diagnosis not present

## 2015-09-11 DIAGNOSIS — J449 Chronic obstructive pulmonary disease, unspecified: Secondary | ICD-10-CM

## 2015-09-11 DIAGNOSIS — R0981 Nasal congestion: Secondary | ICD-10-CM

## 2015-09-11 DIAGNOSIS — I25810 Atherosclerosis of coronary artery bypass graft(s) without angina pectoris: Secondary | ICD-10-CM | POA: Diagnosis not present

## 2015-09-11 DIAGNOSIS — E785 Hyperlipidemia, unspecified: Secondary | ICD-10-CM

## 2015-09-11 DIAGNOSIS — R0989 Other specified symptoms and signs involving the circulatory and respiratory systems: Secondary | ICD-10-CM

## 2015-09-11 NOTE — Patient Instructions (Signed)
Your physician has requested that you have a carotid duplex. This test is an ultrasound of the carotid arteries in your neck. It looks at blood flow through these arteries that supply the brain with blood. Allow one hour for this exam. There are no restrictions or special instructions. This will be done in MARCH 2017.  Your physician has requested that you have an echocardiogram. Echocardiography is a painless test that uses sound waves to create images of your heart. It provides your doctor with information about the size and shape of your heart and how well your heart's chambers and valves are working. This procedure takes approximately one hour. There are no restrictions for this procedure. This will me done in march 2017.  Your physician recommends that you return for lab work fasting.  Your physician wants you to follow-up in: April 2017. You will receive a reminder letter in the mail two months in advance. If you don't receive a letter, please call our office to schedule the follow-up appointment.

## 2015-09-13 ENCOUNTER — Encounter: Payer: Self-pay | Admitting: Cardiovascular Disease

## 2015-09-13 DIAGNOSIS — R0989 Other specified symptoms and signs involving the circulatory and respiratory systems: Secondary | ICD-10-CM | POA: Insufficient documentation

## 2015-09-13 NOTE — Progress Notes (Signed)
Patient ID: Mary [name], female   DOB: 07-16-1951, 64 y.o.   MRN: [id number]     HPI: Mary [name] is a 64 y.o. female who presents to the office for a 6 month followup cardiology evaluation.   Mary [name] has a history of permanent atrial fibrillation, hypertension, type 2 diabetes mellitus, COPD and long-standing prior history of tobacco use. In February 2014 she was hospitalized and felt to have diastolic congestive heart failure. She has been on low-dose ACE inhibitor. An echo Doppler in November 2013 showed an ejection fraction of 55-60% with grade 1 diastolic dysfunction, moderate mitral regurgitation, moderate tricuspid regurgitation, biatrial enlargement.   On 01/07/2014  she underwent cardiac catheterization by Dr. Gwenlyn Found after she  presented to the hospital with left-sided chest pain, associated with diaphoresis, weakness, nausea and vomiting.  Cardiac catheterization demonstrated 80% tapered calcified left main stenosis with 3 vessel CAD involving a subtotal proximal to mid LAD stenosis with TIMI 2 flow, an occluded circumflex vessel.  After the first large bifurcating obtuse marginal branch as well as 70% mid AV groove stenosis and a small-caliber occluded mid RCA.  She underwent CABG revascularization surgery on 01/09/2014 by Dr. Tharon Aquas Tright and had a LIMA placed to the LAD, a saphenous vein graft to the ramus intermediate, saphenous vein graft to the circumflex marginal vessel.  She also had application of a left atrial clip.  Preoperative ejection fraction was 30%.  In April 2015 she was on a medical regimen, consisting of Lasix 40 mg, lisinopril 2.5 mg twice a day, digoxin, 0.125 mg, metoprolol tartrate 12.5 twice a day.  An echo Doppler study on 04/17/2014 showed improvement in her Ejection fraction at 55-60%, although she had a pseudo-normal left ventricular filling pattern consistent with grade 2 diastolic dysfunction.  Mild hypokinesis of the mid, anteroseptal, basal, inferoseptal and  apical septal wall.  Mitral annular calcification, mild MR, moderate LA, and mild RA dilatation with mild TR. She had been in sinus rhythm and when last seen by me July was maintaining sinus rhythm.  At that time, I discontinued her Lanoxin and further titrated her metoprolol tartrate from 12.5 twice a day to 25 mg twice a day.  Postoperatively, she had converted initially to sinus rhythm and was in normal sinus rhythm as of her July 2015 evaluation.  She underwent a nuclear perfusion study on 01/03/2015 which revealed normal perfusion.  The study was not gated due to her atrial fibrillation.  Apparently, she had recently experienced a presyncopal episode.  She was referred for MR of her brain which raised the possibility of a small nonhemorrhagic infarct within the subcortical white matter of the right postcentral gyrus.  There also is moderate  Age advanced periventricular and subcortical T2 changes.  She is followed by Dr. Buelah Manis. She tells me she is scheduled to have an EEG later this week.  Since I last saw her, she denies any episodes of chest pain.  She has noticed some mild shortness of breath with exertion.  She notes mild dizziness as she gets up too quickly.  She admits to some mild intermittent lower extremity edema.  She denies a sensation of palpitations.She denies PND, orthopnea.  She has chronic right bundle branch block. She presents for evaluation.  Past Medical History  Diagnosis Date  . Diabetes mellitus   . Arthritis   . Anxiety   . Chronic pain     Bacl pain, Disc L5-S1- Dr. Joya Salm in [place]  . Hyperlipidemia   .  Hypertension   . Tachycardia   . Bronchial asthma   . Atrial fibrillation ([place])   . COPD (chronic obstructive pulmonary disease) ([place])   . DDD (degenerative disc disease)     Radicular symptoms  . Shortness of breath   . CHF (congestive heart failure) ([place])   . Hx of echocardiogram     Was interpreted by Dr Doylene Canard that showed an Ef in the 50-60% range with grade 1  diastolic dysfunction. she had moderate MR, biatrial enlargement, moderate TR and at that time estimated RV systolic pressure was 43 mm.  . History of stress test 06/2011    Abnormal myocardial perfusion study.    Past Surgical History  Procedure Laterality Date  . Carpal tunnel release      x 2  . Breast cyst removed    . Abdominal hysterectomy      partial  . Tubal ligation    . Tonsillectomy    . Coronary artery bypass graft N/A 01/09/2014    Procedure: CORONARY ARTERY BYPASS GRAFTING (CABG) x 3 using endoscopically harvested right saphenous vein and left internal mammary artery and closure of left atrial appendage;  Surgeon: Ivin Poot, MD;  Location: [facility];  Service: Open Heart Surgery;  Laterality: N/A;  patient has preop IA BP   . Intraoperative transesophageal echocardiogram N/A 01/09/2014    Procedure: INTRAOPERATIVE TRANSESOPHAGEAL ECHOCARDIOGRAM;  Surgeon: Ivin Poot, MD;  Location: [facility];  Service: Open Heart Surgery;  Laterality: N/A;  . Left heart catheterization with coronary angiogram N/A 01/07/2014    Procedure: LEFT HEART CATHETERIZATION WITH CORONARY ANGIOGRAM;  Surgeon: Lorretta Harp, MD;  Location: [facility] CATH LAB;  Service: Cardiovascular;  Laterality: N/A;    Allergies  Allergen Reactions  . Adhesive [Tape] Other (See Comments)    Tears skin off.   . Latex Other (See Comments)    In tape, tears skin off.  . Zocor [Simvastatin - High Dose] Nausea And Vomiting    Current Outpatient Prescriptions  Medication Sig Dispense Refill  . ACCU-CHEK AVIVA PLUS test strip USE TO TEST 3 TO 4 TIMES DAILY OR AS DIRECTED. 100 each PRN  . acetaminophen (TYLENOL) 500 MG tablet Take 500 mg by mouth every 6 (six) hours as needed for mild pain.    Marland Kitchen albuterol (PROAIR HFA) 108 (90 BASE) MCG/ACT inhaler Inhale 2 puffs into the lungs every 6 (six) hours as needed for wheezing or shortness of breath. 18 g 11  . aspirin EC 81 MG tablet Take 81 mg by mouth daily.    Marland Kitchen  atorvastatin (LIPITOR) 40 MG tablet TAKE ONE TABLET BY MOUTH DAILY. 90 tablet 2  . DIGOX 125 MCG tablet TAKE ONE TABLET BY MOUTH ONCE DAILY. 30 tablet 11  . fluticasone (FLONASE) 50 MCG/ACT nasal spray Place 2 sprays into both nostrils daily. 16 g 6  . furosemide (LASIX) 40 MG tablet TAKE 1 TABLET BY MOUTH ONCE DAILY. 30 tablet 2  . JANUVIA 100 MG tablet TAKE ONE TABLET BY MOUTH DAILY. 30 tablet 0  . lisinopril (PRINIVIL,ZESTRIL) 2.5 MG tablet Take 1 tablet (2.5 mg total) by mouth daily. 30 tablet 11  . metFORMIN (GLUCOPHAGE) 850 MG tablet TAKE 1 TABLET BY MOUTH TWICE DAILY WITH MEALS. 60 tablet 0  . metoprolol (LOPRESSOR) 50 MG tablet Take 1.5 tablets (75 mg total) by mouth 2 (two) times daily. 90 tablet 11  . omeprazole (PRILOSEC) 20 MG capsule Take 1 capsule (20 mg total) by mouth 2 (two) times  daily. 60 capsule 1  . potassium chloride SA (K-DUR,KLOR-CON) 20 MEQ tablet TAKE 1 TABLET BY MOUTH ONCE DAILY. TAKE WITH FUROSEMIDE. 30 tablet 0  . PRADAXA 150 MG CAPS capsule TAKE ONE CAPSULE BY MOUTH TWICE DAILY. 60 capsule 5  . sodium chloride (OCEAN) 0.65 % nasal spray Place 1 spray into the nose as needed for congestion. Congestion    . SYMBICORT 160-4.5 MCG/ACT inhaler INHALE 2 PUFFS INTO THE LUNGS TWICE DAILY. RINSE MOUTH AFTER USE. 10.2 g 0  . TL ICON capsule TAKE 1 CAPSULE BY MOUTH ONCE DAILY. 30 capsule 3   No current facility-administered medications for this visit.    Social History   Social History  . Marital Status: Divorced    Spouse Name: N/A  . Number of Children: 1  . Years of Education: 11   Occupational History  . Not on file.   Social History Main Topics  . Smoking status: Former Smoker -- 1.50 packs/day for 30 years    Types: Cigarettes    Quit date: 05/26/2013  . Smokeless tobacco: Never Used     Comment: quit 6 months ago  . Alcohol Use: No  . Drug Use: No  . Sexual Activity: Yes    Birth Control/ Protection: Surgical   Other Topics Concern  . Not on file    Social History Narrative   Lives at home with sister and sister-in-law   Caffeine use : drink 1 cup coffee in morning        Socially she is divorce. She has one deceased child and 2 grandchildren. She quit tobacco at the end of August 2014.  She now has one great grandchild. There is no alcohol use. She does not routinely exercise.  Family History  Problem Relation Age of Onset  . Diabetes Mother   . Heart disease Mother   . Diabetes Sister   . Hypertension Sister   . Hyperlipidemia Sister   . Diabetes Brother   . Heart disease Brother   . Diabetes Brother   . Heart disease Brother    ROS General: Negative; No fevers, chills, or night sweats; positive for fatigue HEENT: Negative; No changes in vision or hearing, sinus congestion, difficulty swallowing Pulmonary: Positive for mild shortness of breath; history of COPD ;No cough, wheezing, , hemoptysis Cardiovascular: Positive for palpitations.  Mild shortness of breath with activity.  No chest pressure. GI: Positive for occasional nausea, no vomiting, diarrhea, or abdominal pain GU: Negative; No dysuria, hematuria, or difficulty voiding Musculoskeletal: Negative; no myalgias, joint pain, or weakness Hematologic/Oncology: Negative; no easy bruising, bleeding Endocrine: Negative; no heat/cold intolerance; positive for diabetes Neuro: Negative; no changes in balance, headaches Skin: Negative; No rashes or skin lesions Psychiatric: positive for anxiety depression Sleep: Negative; No snoring, daytime sleepiness, hypersomnolence, bruxism, restless legs, hypnogognic hallucinations, no cataplexy Other comprehensive 14 point system review is negative   PE BP 120/58 mmHg  Pulse 69  Ht 5' 7"  (1.702 m)  Wt 138 lb 6.4 oz (62.778 kg)  BMI 21.67 kg/m2  Repeat BP by me 130/70  Wt Readings from Last 3 Encounters:  09/11/15 138 lb 6.4 oz (62.778 kg)  04/17/15 135 lb (61.236 kg)  03/26/15 140 lb (63.504 kg)   General: Alert,  oriented, no distress.  Skin: normal turgor, no rashes HEENT: Normocephalic, atraumatic. Pupils round and reactive; sclera anicteric;no lid lag.  Nose without nasal septal hypertrophy Mouth/Parynx benign; Mallinpatti scale 2/3 Neck: No JVD,  Left carotid bruit Chest wall: Mild tenderness  over the sternotomy site with suggestion of very minimal separation of the upper sternum. Lungs: Decreased breath sound segmenter long-standing tobacco use. no wheezing or rales Heart: PMI is not displaced.  Rhythm is irregularly irregular with a rate of approximately 75bpm; s1 s2 normal 1/6 systolic murmur; no diastolic murmur;  No rubs thrills or heaves Abdomen: soft, nontender; no hepatosplenomehaly, BS+; abdominal aorta nontender and not dilated by palpation. Pulses 2+ Extremities:  leg edema no clubbing cyanosis, Homan's sign negative  Neurologic: grossly nonfocal Psychologic: Normal affect and mood  ECG (independently read by me):  Atrial fibrillation with a controlled ventricular response at 69 bpm. Right bundle branch block.  May 2016 ECG (independently read by me):  Atrial fibrillation at 75 bpm.  Right bundle branch block with repolarization changes.  December 2015ECG (independently read by me) atrial fibrillation with a ventricular rate at 10 2 bpm.  Right bundle-branch block with repolarization changes.  Nonspecific ST changes.  09/04/2014 ECG (independently read by me): atrial fibrillation with ventricular response at 1 20 bpm with right bundle branch block, repolarization changes.  04/25/2014 ECG (independently read by me): Normal sinus rhythm at 63 beats per minute.  Recommend followup with repolarization changes.  Prior April 2015 ECG: Normal sinus rhythm with right bundle branch block and repolarization changes.  Prior 10/04/2013 ECG: Atrial fibrillation at a rate At approximately 100-120 beats per minute. Nonspecific ST changes.  LABS:  BMP Latest Ref Rng 03/17/2015 02/20/2015 02/17/2015   Glucose 65 - 99 mg/dL 152(H) 150(H) 137(H)  BUN 6 - 20 mg/dL 10 8 8   Creatinine 0.44 - 1.00 mg/dL 0.53 0.48(L) 0.60  Sodium 135 - 145 mmol/L 135 139 136  Potassium 3.5 - 5.1 mmol/L 3.8 3.2(L) 3.2(L)  Chloride 101 - 111 mmol/L 96(L) 102 100  CO2 22 - 32 mmol/L 27 26 25   Calcium 8.9 - 10.3 mg/dL 9.6 9.5 9.1   Hepatic Function Latest Ref Rng 03/17/2015 02/20/2015 02/17/2015  Total Protein 6.5 - 8.1 g/dL 7.6 6.3 7.1  Albumin 3.5 - 5.0 g/dL 4.2 3.7 4.0  AST 15 - 41 U/L 19 12 18   ALT 14 - 54 U/L 12(L) 8 12  Alk Phosphatase 38 - 126 U/L 59 47 49  Total Bilirubin 0.3 - 1.2 mg/dL 0.9 0.4 0.6  Bilirubin, Direct 0.0 - 0.5 mg/dL - - -   CBC Latest Ref Rng 03/17/2015 02/20/2015 02/17/2015  WBC 4.0 - 10.5 K/uL 13.5(H) 12.0(H) 13.4(H)  Hemoglobin 12.0 - 15.0 g/dL 11.2(L) 11.1(L) 11.9(L)  Hematocrit 36.0 - 46.0 % 34.8(L) 33.5(L) 36.8  Platelets 150 - 400 K/uL 285 206 207   Lab Results  Component Value Date   MCV 92.8 03/17/2015   MCV 92.3 02/20/2015   MCV 91.5 02/17/2015   Lab Results  Component Value Date   TSH 0.698 05/15/2014    Lab Results  Component Value Date   HGBA1C 8.6* 01/25/2015   Lipid Panel     Component Value Date/Time   CHOL 186 09/28/2014 1115   TRIG 144 09/28/2014 1115   HDL 55 09/28/2014 1115   CHOLHDL 3.4 09/28/2014 1115   VLDL 29 09/28/2014 1115   LDLCALC 102* 09/28/2014 1115   LDLDIRECT 66 04/14/2012 1200    RADIOLOGY: No results found.    ASSESSMENT AND PLAN: Mary [name] is a 64 year old female who has a history of permanent atrial fibrillation.  She also has a history of significant prior tobacco use, but fortunately she has quit smoking.  On 01/09/2014 she underwent CABG surgery  for severe multivessel CAD, including left main stenosis.  At that time, she did have a left atrial clip.  Preoperatively, her ejection fraction was 30%.  Postoperatively, her ejection fraction had improved to 55-60%.  Following her surgery, she initially was not restarted on Pradaxa  since she was maintaining sinus rhythm.   She developed recurrent AF has since been on chronic Pradaxa 150 mg twice a day. Her blood pressure today is well controlled on metoprolol 75 mg twice a day, lisinopril 2.5 mg, Lasix 40 mg daily.  Her ventricular rate is controlled and she also is on Lanoxin 0.125 mg.  She does not have active wheezing on exam and continues to be on Symbicort 160/4.5 g  In addition to an albuterol inhaler as needed.  She is diabetic, on Januvia 100 mg daily.  She is tolerating atorvastatin 40 mg for hyperlipidemia.   She is no longer seeing Dr.K  [facility], and will be seeing Dr. Wende Neighbors to establish primary care neck week.  He will be obtaining laboratory.  I will last that he send blood work for my review.  Presently, she will continue her current medical regimen. In March 2017 I am scheduling her for a Carotid Doppler study to evaluate her left carotid bruit and she will undergo 2-D echo Doppler study.  I will see her in April for follow-up office visit.    Time spent: 25 minutes  Troy Sine, MD, [facility]  09/13/2015 8:05 PM

## 2015-09-18 DIAGNOSIS — G894 Chronic pain syndrome: Secondary | ICD-10-CM | POA: Diagnosis not present

## 2015-09-18 DIAGNOSIS — F339 Major depressive disorder, recurrent, unspecified: Secondary | ICD-10-CM | POA: Diagnosis not present

## 2015-09-18 DIAGNOSIS — I2510 Atherosclerotic heart disease of native coronary artery without angina pectoris: Secondary | ICD-10-CM | POA: Diagnosis not present

## 2015-09-18 DIAGNOSIS — E119 Type 2 diabetes mellitus without complications: Secondary | ICD-10-CM | POA: Diagnosis not present

## 2015-09-18 DIAGNOSIS — I10 Essential (primary) hypertension: Secondary | ICD-10-CM | POA: Diagnosis not present

## 2015-09-18 DIAGNOSIS — E785 Hyperlipidemia, unspecified: Secondary | ICD-10-CM | POA: Diagnosis not present

## 2015-09-18 DIAGNOSIS — I509 Heart failure, unspecified: Secondary | ICD-10-CM | POA: Diagnosis not present

## 2015-09-18 DIAGNOSIS — F411 Generalized anxiety disorder: Secondary | ICD-10-CM | POA: Diagnosis not present

## 2015-09-23 ENCOUNTER — Other Ambulatory Visit: Payer: Self-pay | Admitting: Family Medicine

## 2015-09-23 NOTE — Telephone Encounter (Signed)
Patient has been discharged from practice.  Medication refill denied 

## 2015-09-25 ENCOUNTER — Other Ambulatory Visit: Payer: Self-pay | Admitting: Cardiovascular Disease

## 2015-09-25 NOTE — Telephone Encounter (Signed)
REFILL 

## 2015-10-02 DIAGNOSIS — I2510 Atherosclerotic heart disease of native coronary artery without angina pectoris: Secondary | ICD-10-CM | POA: Diagnosis not present

## 2015-10-02 DIAGNOSIS — F411 Generalized anxiety disorder: Secondary | ICD-10-CM | POA: Diagnosis not present

## 2015-10-02 DIAGNOSIS — G894 Chronic pain syndrome: Secondary | ICD-10-CM | POA: Diagnosis not present

## 2015-10-02 DIAGNOSIS — F339 Major depressive disorder, recurrent, unspecified: Secondary | ICD-10-CM | POA: Diagnosis not present

## 2015-10-02 DIAGNOSIS — I509 Heart failure, unspecified: Secondary | ICD-10-CM | POA: Diagnosis not present

## 2015-10-07 ENCOUNTER — Other Ambulatory Visit: Payer: Self-pay | Admitting: Family Medicine

## 2015-10-07 NOTE — Telephone Encounter (Signed)
Patient has been discharged from practice.  Medication refill denied 

## 2015-10-22 ENCOUNTER — Other Ambulatory Visit: Payer: Self-pay | Admitting: Cardiovascular Disease

## 2015-10-22 NOTE — Telephone Encounter (Signed)
E-SENT TO PHARMACY  REFILL X4

## 2015-12-17 ENCOUNTER — Other Ambulatory Visit: Payer: Self-pay | Admitting: Licensed Clinical Social Worker

## 2015-12-18 NOTE — Patient Outreach (Signed)
Assessment:  CSW completed chart review on Mary [name] on 12/17/15.  [name].Jerrica Thorman MSW, LCSW Licensed Clinical Social Worker [facility] Care Management [phone]

## 2016-01-01 ENCOUNTER — Other Ambulatory Visit: Payer: Self-pay | Admitting: *Deleted

## 2016-01-03 DIAGNOSIS — I10 Essential (primary) hypertension: Secondary | ICD-10-CM | POA: Diagnosis not present

## 2016-01-03 DIAGNOSIS — E119 Type 2 diabetes mellitus without complications: Secondary | ICD-10-CM | POA: Diagnosis not present

## 2016-01-08 DIAGNOSIS — D509 Iron deficiency anemia, unspecified: Secondary | ICD-10-CM | POA: Diagnosis not present

## 2016-01-08 DIAGNOSIS — I2510 Atherosclerotic heart disease of native coronary artery without angina pectoris: Secondary | ICD-10-CM | POA: Diagnosis not present

## 2016-01-08 DIAGNOSIS — J449 Chronic obstructive pulmonary disease, unspecified: Secondary | ICD-10-CM | POA: Diagnosis not present

## 2016-01-10 ENCOUNTER — Other Ambulatory Visit: Payer: Self-pay | Admitting: *Deleted

## 2016-01-10 DIAGNOSIS — R0989 Other specified symptoms and signs involving the circulatory and respiratory systems: Secondary | ICD-10-CM

## 2016-01-10 DIAGNOSIS — I10 Essential (primary) hypertension: Secondary | ICD-10-CM

## 2016-01-10 DIAGNOSIS — I4891 Unspecified atrial fibrillation: Secondary | ICD-10-CM

## 2016-01-16 ENCOUNTER — Other Ambulatory Visit: Payer: Self-pay | Admitting: Cardiovascular Disease

## 2016-01-16 ENCOUNTER — Ambulatory Visit (HOSPITAL_COMMUNITY)
Admission: RE | Admit: 2016-01-16 | Discharge: 2016-01-16 | Disposition: A | Discharge status: Home / Self Care | Payer: Medicare Other | Source: Ambulatory Visit | Attending: Cardiovascular Disease | Admitting: Cardiovascular Disease

## 2016-01-16 ENCOUNTER — Ambulatory Visit (HOSPITAL_COMMUNITY)
Admission: RE | Admit: 2016-01-16 | Discharge: 2016-01-16 | Disposition: A | Discharge status: Home / Self Care | Payer: Medicare Other | Source: Ambulatory Visit | Attending: Cardiology | Admitting: Cardiology

## 2016-01-16 DIAGNOSIS — Z951 Presence of aortocoronary bypass graft: Secondary | ICD-10-CM | POA: Diagnosis not present

## 2016-01-16 DIAGNOSIS — I10 Essential (primary) hypertension: Secondary | ICD-10-CM | POA: Insufficient documentation

## 2016-01-16 DIAGNOSIS — E785 Hyperlipidemia, unspecified: Secondary | ICD-10-CM | POA: Insufficient documentation

## 2016-01-16 DIAGNOSIS — I6523 Occlusion and stenosis of bilateral carotid arteries: Secondary | ICD-10-CM

## 2016-01-16 DIAGNOSIS — I340 Nonrheumatic mitral (valve) insufficiency: Secondary | ICD-10-CM | POA: Diagnosis not present

## 2016-01-16 DIAGNOSIS — Z87891 Personal history of nicotine dependence: Secondary | ICD-10-CM | POA: Insufficient documentation

## 2016-01-16 DIAGNOSIS — R0989 Other specified symptoms and signs involving the circulatory and respiratory systems: Secondary | ICD-10-CM

## 2016-01-16 DIAGNOSIS — I2510 Atherosclerotic heart disease of native coronary artery without angina pectoris: Secondary | ICD-10-CM | POA: Insufficient documentation

## 2016-01-16 DIAGNOSIS — J449 Chronic obstructive pulmonary disease, unspecified: Secondary | ICD-10-CM | POA: Insufficient documentation

## 2016-01-16 DIAGNOSIS — I4891 Unspecified atrial fibrillation: Secondary | ICD-10-CM | POA: Insufficient documentation

## 2016-01-20 ENCOUNTER — Telehealth: Payer: Self-pay | Admitting: Cardiovascular Disease

## 2016-01-20 NOTE — Telephone Encounter (Signed)
Pt calling about echo results. Have made her aware provider result notes pending & we will call w/ recommendations. She voiced understanding.

## 2016-01-20 NOTE — Telephone Encounter (Signed)
Normal LV systolic function; biatrial enlargment; trace AI; mild MR and TR; mildly elevated pulmonary pressure.

## 2016-01-20 NOTE — Telephone Encounter (Signed)
New Message:  Pt had test on Thursday,she is very anxious to get her results. Please call asap,she wants to know what he plans to do after he gets her results.

## 2016-01-21 ENCOUNTER — Telehealth: Payer: Self-pay | Admitting: *Deleted

## 2016-01-21 NOTE — Telephone Encounter (Signed)
Called and gave patient echo results. Advised to keep scheduled May appointment. Patient voiced understanding.

## 2016-01-21 NOTE — Telephone Encounter (Signed)
-----   Message from Troy Sine, MD sent at 01/20/2016  5:40 PM EDT ----- Normal LV systolic function; biatrial enlargment; trace AI; mild MR and TR; mildly elevated pulmonary pressure.

## 2016-03-13 ENCOUNTER — Ambulatory Visit (INDEPENDENT_AMBULATORY_CARE_PROVIDER_SITE_OTHER): Payer: Medicare Other | Admitting: Cardiovascular Disease

## 2016-03-13 ENCOUNTER — Encounter: Payer: Self-pay | Admitting: Cardiovascular Disease

## 2016-03-13 VITALS — BP 86/56 | HR 66 | Ht 67.0 in | Wt 127.0 lb

## 2016-03-13 DIAGNOSIS — E785 Hyperlipidemia, unspecified: Secondary | ICD-10-CM | POA: Diagnosis not present

## 2016-03-13 DIAGNOSIS — I2510 Atherosclerotic heart disease of native coronary artery without angina pectoris: Secondary | ICD-10-CM

## 2016-03-13 DIAGNOSIS — I482 Chronic atrial fibrillation: Secondary | ICD-10-CM | POA: Diagnosis not present

## 2016-03-13 DIAGNOSIS — I952 Hypotension due to drugs: Secondary | ICD-10-CM | POA: Diagnosis not present

## 2016-03-13 DIAGNOSIS — R0989 Other specified symptoms and signs involving the circulatory and respiratory systems: Secondary | ICD-10-CM

## 2016-03-13 DIAGNOSIS — I4821 Permanent atrial fibrillation: Secondary | ICD-10-CM

## 2016-03-13 DIAGNOSIS — I2583 Coronary atherosclerosis due to lipid rich plaque: Secondary | ICD-10-CM

## 2016-03-13 NOTE — Progress Notes (Signed)
Patient ID: Mary [name], female   DOB: Sep 14, 1951, 65 y.o.   MRN: [id number]     HPI: Mary [name] is a 65 y.o. female who presents to the office for a 6 month followup cardiology evaluation.   Mary [name] has a history of permanent atrial fibrillation, hypertension, type 2 diabetes mellitus, COPD and long-standing prior history of tobacco use. In February 2014 she was hospitalized and felt to have diastolic congestive heart failure. She has been on low-dose ACE inhibitor. An echo Doppler in November 2013 showed an ejection fraction of 55-60% with grade 1 diastolic dysfunction, moderate mitral regurgitation, moderate tricuspid regurgitation, biatrial enlargement.   On 01/07/2014  she underwent cardiac catheterization after she  presented to the hospital with left-sided chest pain, associated with diaphoresis, weakness, nausea and vomiting.  Cardiac catheterization demonstrated 80% tapered calcified left main stenosis with 3 vessel CAD involving a subtotal proximal to mid LAD stenosis with TIMI 2 flow, an occluded circumflex vessel.  After the first large bifurcating obtuse marginal branch as well as 70% mid AV groove stenosis and a small-caliber occluded mid RCA.  She underwent CABG revascularization surgery on 01/09/2014 by Mary [name] and had a LIMA placed to the LAD, a saphenous vein graft to the ramus intermediate, saphenous vein graft to the circumflex marginal vessel.  She also had application of a left atrial clip.  Preoperative ejection fraction was 30%.  In April 2015 she was on a medical regimen, consisting of Lasix 40 mg, lisinopril 2.5 mg twice a day, digoxin, 0.125 mg, metoprolol tartrate 12.5 twice a day.  An echo Doppler study on 04/17/2014 showed improvement in her Ejection fraction at 55-60%, although she had a pseudo-normal left ventricular filling pattern consistent with grade 2 diastolic dysfunction.  Mild hypokinesis of the mid, anteroseptal, basal, inferoseptal and apical septal  wall.  Mitral annular calcification, mild MR, moderate LA, and mild RA dilatation with mild TR. She had been in sinus rhythm and when last seen by me July was maintaining sinus rhythm.  At that time, I discontinued her Lanoxin and further titrated her metoprolol tartrate from 12.5 twice a day to 25 mg twice a day.  Postoperatively, she had converted initially to sinus rhythm and was in normal sinus rhythm as of her July 2015 evaluation.  A nuclear perfusion study on 01/03/2015 which revealed normal perfusion.  The study was not gated due to her atrial fibrillation.  Apparently, she had recently experienced a presyncopal episode.  She was referred for MR of her brain which raised the possibility of a small nonhemorrhagic infarct within the subcortical white matter of the right postcentral gyrus.  There also is moderate  Age advanced periventricular and subcortical T2 changes.  She is followed by Mary [name]. She tells me she is scheduled to have an EEG later this week.  Since I last saw her, she denies any episodes of chest pain.  She has noticed some mild shortness of breath with exertion.  She notes mild dizziness as she gets up too quickly.  An echo Doppler study in March 2017 showed an EF of 55-60%.  She had trivial aortic insufficiency.  A peak gradient of her aortic valve was 10 mm.  There was mild aortic regurgitation.  She underwent carotid duplex imaging which showed heterogeneous plaque bilaterally.  There were essentially stable 40-59% right internal carotid artery stenoses and 60-79% left internal carotid artery stenoses.  She greater than 50% bilateral external carotid stenosis.  She had  normal subclavian arteries bilaterally with antegrade flow to her vertebrals.  She admits to fatigue.  Her sleep is poor.  She does not think she snores.  She notes occasional leg cramps.  She presents for evaluation.  Past Medical History  Diagnosis Date  . Diabetes mellitus   . Arthritis   . Anxiety   .  Chronic pain     Bacl pain, Disc L5-S1- Dr. Joya Salm in [place]  . Hyperlipidemia   . Hypertension   . Tachycardia   . Bronchial asthma   . Atrial fibrillation ([place])   . COPD (chronic obstructive pulmonary disease) ([place])   . DDD (degenerative disc disease)     Radicular symptoms  . Shortness of breath   . CHF (congestive heart failure) ([place])   . Hx of echocardiogram     Was interpreted by Dr Doylene Canard that showed an Ef in the 50-60% range with grade 1 diastolic dysfunction. she had moderate MR, biatrial enlargement, moderate TR and at that time estimated RV systolic pressure was 43 mm.  . History of stress test 06/2011    Abnormal myocardial perfusion study.    Past Surgical History  Procedure Laterality Date  . Carpal tunnel release      x 2  . Breast cyst removed    . Abdominal hysterectomy      partial  . Tubal ligation    . Tonsillectomy    . Coronary artery bypass graft N/A 01/09/2014    Procedure: CORONARY ARTERY BYPASS GRAFTING (CABG) x 3 using endoscopically harvested right saphenous vein and left internal mammary artery and closure of left atrial appendage;  Surgeon: Ivin Poot, MD;  Location: [facility];  Service: Open Heart Surgery;  Laterality: N/A;  patient has preop IA BP   . Intraoperative transesophageal echocardiogram N/A 01/09/2014    Procedure: INTRAOPERATIVE TRANSESOPHAGEAL ECHOCARDIOGRAM;  Surgeon: Ivin Poot, MD;  Location: [facility];  Service: Open Heart Surgery;  Laterality: N/A;  . Left heart catheterization with coronary angiogram N/A 01/07/2014    Procedure: LEFT HEART CATHETERIZATION WITH CORONARY ANGIOGRAM;  Surgeon: Lorretta Harp, MD;  Location[facility] C[facility];  Service: Cardiovascular;  Laterality: N/A;    Allergies  Allergen Reactions  . Adhesive [Tape] Other (See Comments)    Tears skin off.   . Latex Other (See Comments)    In tape, tears skin off.  . Zocor [Simvastatin - High Dose] Nausea And Vomiting    Current Outpatient Prescriptions    Medication Sig Dispense Refill  . ACCU-CHEK AVIVA PLUS test strip USE TO TEST 3 TO 4 TIMES DAILY OR AS DIRECTED. 100 each PRN  . acetaminophen (TYLENOL) 500 MG tablet Take 500 mg by mouth every 6 (six) hours as needed for mild pain.    Marland Kitchen albuterol (PROAIR HFA) 108 (90 BASE) MCG/ACT inhaler Inhale 2 puffs into the lungs every 6 (six) hours as needed for wheezing or shortness of breath. 18 g 11  . ALPRAZolam (XANAX) 1 MG tablet     . aspirin EC 81 MG tablet Take 81 mg by mouth daily.    Marland Kitchen atorvastatin (LIPITOR) 40 MG tablet TAKE ONE TABLET BY MOUTH DAILY. 90 tablet 2  . DIGOX 125 MCG tablet TAKE ONE TABLET BY MOUTH ONCE DAILY. 30 tablet 11  . fluticasone (FLONASE) 50 MCG/ACT nasal spray Place 2 sprays into both nostrils daily. 16 g 6  . glipiZIDE (GLUCOTROL XL) 2.5 MG 24 hr tablet     . JANUVIA 100 MG tablet  TAKE ONE TABLET BY MOUTH DAILY. 30 tablet 0  . lisinopril (PRINIVIL,ZESTRIL) 2.5 MG tablet TAKE ONE TABLET BY MOUTH DAILY. 30 tablet 4  . metFORMIN (GLUCOPHAGE) 850 MG tablet TAKE 1 TABLET BY MOUTH TWICE DAILY WITH MEALS. 60 tablet 0  . metoprolol (LOPRESSOR) 50 MG tablet Take 1.5 tablets (75 mg total) by mouth 2 (two) times daily. 90 tablet 11  . montelukast (SINGULAIR) 10 MG tablet     . omeprazole (PRILOSEC) 20 MG capsule Take 1 capsule (20 mg total) by mouth 2 (two) times daily. 60 capsule 1  . PRADAXA 150 MG CAPS capsule TAKE ONE CAPSULE BY MOUTH TWICE DAILY. 60 capsule 5  . sodium chloride (OCEAN) 0.65 % nasal spray Place 1 spray into the nose as needed for congestion. Congestion    . SYMBICORT 160-4.5 MCG/ACT inhaler INHALE 2 PUFFS INTO THE LUNGS TWICE DAILY. RINSE MOUTH AFTER USE. 10.2 g 0   No current facility-administered medications for this visit.    Social History   Social History  . Marital Status: Divorced    Spouse Name: N/A  . Number of Children: 1  . Years of Education: 11   Occupational History  . Not on file.   Social History Main Topics  . Smoking status:  Former Smoker -- 1.50 packs/day for 30 years    Types: Cigarettes    Quit date: 05/26/2013  . Smokeless tobacco: Never Used     Comment: quit 6 months ago  . Alcohol Use: No  . Drug Use: No  . Sexual Activity: Yes    Birth Control/ Protection: Surgical   Other Topics Concern  . Not on file   Social History Narrative   Lives at home with sister and sister-in-law   Caffeine use : drink 1 cup coffee in morning        Socially she is divorce. She has one deceased child and 2 grandchildren. She quit tobacco at the end of August 2014.  She now has one great grandchild. There is no alcohol use. She does not routinely exercise.  Family History  Problem Relation Age of Onset  . Diabetes Mother   . Heart disease Mother   . Diabetes Sister   . Hypertension Sister   . Hyperlipidemia Sister   . Diabetes Brother   . Heart disease Brother   . Diabetes Brother   . Heart disease Brother    ROS General: Negative; No fevers, chills, or night sweats; positive for fatigue HEENT: Negative; No changes in vision or hearing, sinus congestion, difficulty swallowing Pulmonary: Positive for mild shortness of breath; history of COPD ;No cough, wheezing, , hemoptysis Cardiovascular: Positive for palpitations.  Mild shortness of breath with activity.  No chest pressure. GI: Positive for occasional nausea, no vomiting, diarrhea, or abdominal pain GU: Negative; No dysuria, hematuria, or difficulty voiding Musculoskeletal: Negative; no myalgias, joint pain, or weakness Hematologic/Oncology: Negative; no easy bruising, bleeding Endocrine: Negative; no heat/cold intolerance; positive for diabetes Neuro: Negative; no changes in balance, headaches Skin: Negative; No rashes or skin lesions Psychiatric: positive for anxiety depression Sleep: Negative; No snoring, daytime sleepiness, hypersomnolence, bruxism, restless legs, hypnogognic hallucinations, no cataplexy Other comprehensive 14 point system review is  negative   PE BP 86/56 mmHg  Pulse 66  Ht 5' 7"  (1.702 m)  Wt 127 lb (57.607 kg)  BMI 19.89 kg/m2  Repeat BP by me was 90/60.  Wt Readings from Last 3 Encounters:  03/13/16 127 lb (57.607 kg)  09/11/15 138 lb  6.4 oz (62.778 kg)  04/17/15 135 lb (61.236 kg)   General: Alert, oriented, no distress.  Skin: normal turgor, no rashes HEENT: Normocephalic, atraumatic. Pupils round and reactive; sclera anicteric;no lid lag.  Nose without nasal septal hypertrophy Mouth/Parynx benign; Mallinpatti scale 2/3 Neck: No JVD,  Bilateral carotid bruit; left greater than right Chest wall: Mild tenderness over the sternotomy site with suggestion of very minimal separation of the upper sternum. Lungs: Decreased breath sound segmenter long-standing tobacco use. no wheezing or rales Heart: PMI is not displaced.  Rhythm is irregularly irregular with a rate of approximately 75bpm; s1 s2 normal 1/6 systolic murmur; no diastolic murmur;  No rubs thrills or heaves Abdomen: soft, nontender; no hepatosplenomehaly, BS+; abdominal aorta nontender and not dilated by palpation. Pulses 2+ Extremities:  leg edema no clubbing cyanosis, Homan's sign negative  Neurologic: grossly nonfocal Psychologic: Normal affect and mood  ECG (independently read by me): Atrial fibrillation at 75 bpm with right bundle branch block and repolarization changes  ECG (independently read by me):  Atrial fibrillation with a controlled ventricular response at 69 bpm. Right bundle branch block.  May 2016 ECG (independently read by me):  Atrial fibrillation at 75 bpm.  Right bundle branch block with repolarization changes.  December 2015ECG (independently read by me) atrial fibrillation with a ventricular rate at 10 2 bpm.  Right bundle-branch block with repolarization changes.  Nonspecific ST changes.  09/04/2014 ECG (independently read by me): atrial fibrillation with ventricular response at 1 20 bpm with right bundle branch block,  repolarization changes.  04/25/2014 ECG (independently read by me): Normal sinus rhythm at 63 beats per minute.  Recommend followup with repolarization changes.  Prior April 2015 ECG: Normal sinus rhythm with right bundle branch block and repolarization changes.  Prior 10/04/2013 ECG: Atrial fibrillation at a rate At approximately 100-120 beats per minute. Nonspecific ST changes.  LABS:  BMP Latest Ref Rng 03/17/2015 02/20/2015 02/17/2015  Glucose 65 - 99 mg/dL 152(H) 150(H) 137(H)  BUN 6 - 20 mg/dL 10 8 8   Creatinine 0.44 - 1.00 mg/dL 0.53 0.48(L) 0.60  Sodium 135 - 145 mmol/L 135 139 136  Potassium 3.5 - 5.1 mmol/L 3.8 3.2(L) 3.2(L)  Chloride 101 - 111 mmol/L 96(L) 102 100  CO2 22 - 32 mmol/L 27 26 25   Calcium 8.9 - 10.3 mg/dL 9.6 9.5 9.1   Hepatic Function Latest Ref Rng 03/17/2015 02/20/2015 02/17/2015  Total Protein 6.5 - 8.1 g/dL 7.6 6.3 7.1  Albumin 3.5 - 5.0 g/dL 4.2 3.7 4.0  AST 15 - 41 U/L 19 12 18   ALT 14 - 54 U/L 12(L) 8 12  Alk Phosphatase 38 - 126 U/L 59 47 49  Total Bilirubin 0.3 - 1.2 mg/dL 0.9 0.4 0.6  Bilirubin, Direct 0.0 - 0.5 mg/dL - - -   CBC Latest Ref Rng 03/17/2015 02/20/2015 02/17/2015  WBC 4.0 - 10.5 K/uL 13.5(H) 12.0(H) 13.4(H)  Hemoglobin 12.0 - 15.0 g/dL 11.2(L) 11.1(L) 11.9(L)  Hematocrit 36.0 - 46.0 % 34.8(L) 33.5(L) 36.8  Platelets 150 - 400 K/uL 285 206 207   Lab Results  Component Value Date   MCV 92.8 03/17/2015   MCV 92.3 02/20/2015   MCV 91.5 02/17/2015   Lab Results  Component Value Date   TSH 0.698 05/15/2014    Lab Results  Component Value Date   HGBA1C 8.6* 01/25/2015   Lipid Panel     Component Value Date/Time   CHOL 186 09/28/2014 1115   TRIG 144 09/28/2014 1115  HDL 55 09/28/2014 1115   CHOLHDL 3.4 09/28/2014 1115   VLDL 29 09/28/2014 1115   LDLCALC 102* 09/28/2014 1115   LDLDIRECT 66 04/14/2012 1200    RADIOLOGY: No results found.    ASSESSMENT AND PLAN: Ms. Gainey is a 65 year old female who has permanent atrial  fibrillation on Pradaxa for anticoagulation..  She also has a history of significant prior tobacco use, but fortunately she has quit smoking.  On 01/09/2014 she underwent CABG surgery for severe multivessel CAD, including left main stenosis.  At that time, she did have a left atrial clip.  Preoperatively, her ejection fraction was 30%.  Postoperatively, her ejection fraction had improved to 55-60%.  Following her surgery, she initially was not restarted on Pradaxa since she was maintaining sinus rhythm.   She developed recurrent AF has since been on chronic Pradaxa 150 mg twice a day.  Her most recent echo Doppler study continues to show normal systolic function.  Her blood pressure today is low and she has been on metoprolol 75 mg twice a day for rate control, lisinopril 2.5 mg daily, furosemide 40 mg and recently 20 mg, digoxin 0.125 mg daily.  There are no signs of edema.  I have recommended she discontinue furosemide and she will discontinue supplemental potassium.  She will monitor her blood pressure.  If blood pressure continues to be low.  Further dose reduction of medications will be necessary with possible discontinuance of lisinopril.  I reviewed her carotid studies with her in detail and these reveal stable plaque.  She sleeps poorly.  We discussed the potential evaluation for sleep apnea.  She was referred not to do this presently.  She admits to being under increased stress.  If her blood pressure remains low, she will contact us.  Otherwise, I will see her in 3-4 months for reevaluation.   Time spent: 25 minutes  Troy Sine, MD, [facility]  03/13/2016 7:07 PM

## 2016-03-13 NOTE — Patient Instructions (Signed)
Your physician has recommended you make the following change in your medication:  STOP THE FUROSEMIDE AND POTASSIUM.  Your physician recommends that you schedule a follow-up appointment in: 4 months.

## 2016-03-19 ENCOUNTER — Other Ambulatory Visit: Payer: Self-pay | Admitting: Cardiovascular Disease

## 2016-03-19 NOTE — Telephone Encounter (Signed)
Rx(s) sent to pharmacy electronically.  

## 2016-03-31 ENCOUNTER — Other Ambulatory Visit: Payer: Self-pay | Admitting: Cardiovascular Disease

## 2016-04-15 ENCOUNTER — Other Ambulatory Visit: Payer: Self-pay | Admitting: Cardiovascular Disease

## 2016-04-15 ENCOUNTER — Other Ambulatory Visit: Payer: Self-pay | Admitting: *Deleted

## 2016-04-15 MED ORDER — LISINOPRIL 2.5 MG PO TABS: 2.5000 mg | ORAL_TABLET | Freq: Every day | ORAL | Status: DC

## 2016-04-24 ENCOUNTER — Other Ambulatory Visit: Payer: Self-pay | Admitting: Cardiovascular Disease

## 2016-04-30 ENCOUNTER — Telehealth: Payer: Self-pay | Admitting: *Deleted

## 2016-04-30 ENCOUNTER — Telehealth: Payer: Self-pay | Admitting: Cardiovascular Disease

## 2016-04-30 NOTE — Telephone Encounter (Signed)
There is a comparable encounter call open which has been handled by triage RN. Will close this duplicate encounter.

## 2016-04-30 NOTE — Telephone Encounter (Signed)
Returned call to patient.She stated she has been having cramping in both legs at night for the past week.Stated cramps wake her up at night.Advised see needs to see PCP.

## 2016-04-30 NOTE — Telephone Encounter (Signed)
Patient left a msg on the refill vm with c/o cramps in her legs from her knees to her feet for the last 4-5 days. The pain is keeping her up at night and she is having difficulty walking. She feels that this could be due to stopping the furosemide and potassium. She is not having any swelling. She can be reached at [phone]. Thanks, MI

## 2016-04-30 NOTE — Telephone Encounter (Signed)
New message      Pt c/o medication issue:  1. Name of Medication: Potassium  2. How are you currently taking this medication (dosage and times per day)? 20 meq tablets   3. Are you having a reaction (difficulty breathing--STAT)? no  4. What is your medication issue? The pt wants to know if she can take the potassium cause of extreme leg cramping, she knows she not to take the Lasix but states she needs something for the leg cramping

## 2016-05-01 NOTE — Telephone Encounter (Signed)
Patient called no answer.No voice mail.

## 2016-05-01 NOTE — Telephone Encounter (Signed)
May not be due to low potassium; stay hydrated; f/u pcp if progresses

## 2016-05-06 NOTE — Telephone Encounter (Signed)
Patient was already advised to see PCP.

## 2016-05-08 DIAGNOSIS — E782 Mixed hyperlipidemia: Secondary | ICD-10-CM | POA: Diagnosis not present

## 2016-05-08 DIAGNOSIS — E119 Type 2 diabetes mellitus without complications: Secondary | ICD-10-CM | POA: Diagnosis not present

## 2016-05-08 DIAGNOSIS — D509 Iron deficiency anemia, unspecified: Secondary | ICD-10-CM | POA: Diagnosis not present

## 2016-05-13 DIAGNOSIS — I509 Heart failure, unspecified: Secondary | ICD-10-CM | POA: Diagnosis not present

## 2016-05-13 DIAGNOSIS — J449 Chronic obstructive pulmonary disease, unspecified: Secondary | ICD-10-CM | POA: Diagnosis not present

## 2016-05-13 DIAGNOSIS — E119 Type 2 diabetes mellitus without complications: Secondary | ICD-10-CM | POA: Diagnosis not present

## 2016-05-13 DIAGNOSIS — I2510 Atherosclerotic heart disease of native coronary artery without angina pectoris: Secondary | ICD-10-CM | POA: Diagnosis not present

## 2016-05-13 DIAGNOSIS — Z8673 Personal history of transient ischemic attack (TIA), and cerebral infarction without residual deficits: Secondary | ICD-10-CM | POA: Diagnosis not present

## 2016-07-20 ENCOUNTER — Other Ambulatory Visit: Payer: Self-pay | Admitting: Family Medicine

## 2016-07-20 ENCOUNTER — Other Ambulatory Visit: Payer: Self-pay | Admitting: Cardiovascular Disease

## 2016-07-22 ENCOUNTER — Ambulatory Visit (INDEPENDENT_AMBULATORY_CARE_PROVIDER_SITE_OTHER): Payer: Medicare Other | Admitting: Cardiovascular Disease

## 2016-07-22 ENCOUNTER — Encounter: Payer: Self-pay | Admitting: Cardiovascular Disease

## 2016-07-22 VITALS — BP 120/64 | HR 74 | Ht 67.0 in | Wt 122.2 lb

## 2016-07-22 DIAGNOSIS — R0989 Other specified symptoms and signs involving the circulatory and respiratory systems: Secondary | ICD-10-CM

## 2016-07-22 DIAGNOSIS — I4821 Permanent atrial fibrillation: Secondary | ICD-10-CM

## 2016-07-22 DIAGNOSIS — I482 Chronic atrial fibrillation: Secondary | ICD-10-CM | POA: Diagnosis not present

## 2016-07-22 DIAGNOSIS — I4510 Unspecified right bundle-branch block: Secondary | ICD-10-CM | POA: Diagnosis not present

## 2016-07-22 DIAGNOSIS — E785 Hyperlipidemia, unspecified: Secondary | ICD-10-CM | POA: Diagnosis not present

## 2016-07-22 DIAGNOSIS — I2510 Atherosclerotic heart disease of native coronary artery without angina pectoris: Secondary | ICD-10-CM | POA: Diagnosis not present

## 2016-07-22 DIAGNOSIS — J449 Chronic obstructive pulmonary disease, unspecified: Secondary | ICD-10-CM

## 2016-07-22 MED ORDER — METOPROLOL TARTRATE 50 MG PO TABS: ORAL_TABLET | ORAL | 11 refills | Status: DC

## 2016-07-22 NOTE — Patient Instructions (Signed)
Medication Instructions: INCREASE- Metoprolol 2 tablets in the morning and 1 1/2 at night  Labwork: None Ordered  Testing/Procedures: None Ordered  Follow-Up: Your physician wants you to follow-up in: 6 Months. You will receive a reminder letter in the mail two months in advance. If you don't receive a letter, please call our office to schedule the follow-up appointment.   Any Other Special Instructions Will Be Listed Below (If Applicable).     If you need a refill on your cardiac medications before your next appointment, please call your pharmacy.

## 2016-07-24 NOTE — Progress Notes (Signed)
Patient ID: Mary [name], female   DOB: [birth year], 65 y.o.   MRN: [id number]    PCP: Dr. Wende Neighbors  HPI: Mary [name] is a 65 y.o. female who presents to the office for a 4 month followup cardiology evaluation.   Mary [name] has a history of permanent atrial fibrillation, hypertension, type 2 diabetes mellitus, COPD and long-standing prior history of tobacco use. In February 2014 she was hospitalized and felt to have diastolic congestive heart failure. She has been on low-dose ACE inhibitor. An echo Doppler in November 2013 showed an ejection fraction of 55-60% with grade 1 diastolic dysfunction, moderate mitral regurgitation, moderate tricuspid regurgitation, biatrial enlargement.   On 01/07/2014  she underwent cardiac catheterization after she  presented to the hospital with left-sided chest pain, associated with diaphoresis, weakness, nausea and vomiting.  Cardiac catheterization demonstrated 80% tapered calcified left main stenosis with 3 vessel CAD involving a subtotal proximal to mid LAD stenosis with TIMI 2 flow, an occluded circumflex vessel.  After the first large bifurcating obtuse marginal branch as well as 70% mid AV groove stenosis and a small-caliber occluded mid RCA.  She underwent CABG revascularization surgery on 01/09/2014 by Dr. Tharon Aquas Tright and had a LIMA placed to the LAD, a saphenous vein graft to the ramus intermediate, saphenous vein graft to the circumflex marginal vessel.  She also had application of a left atrial clip.  Preoperative ejection fraction was 30%.  In April 2015 she was on a medical regimen, consisting of Lasix 40 mg, lisinopril 2.5 mg twice a day, digoxin, 0.125 mg, metoprolol tartrate 12.5 twice a day.  An echo Doppler study on 04/17/2014 showed improvement in her Ejection fraction at 55-60%, although she had a pseudo-normal left ventricular filling pattern consistent with grade 2 diastolic dysfunction.  Mild hypokinesis of the mid, anteroseptal, basal,  inferoseptal and apical septal wall.  Mitral annular calcification, mild MR, moderate LA, and mild RA dilatation with mild TR. She had been in sinus rhythm and when last seen by me July was maintaining sinus rhythm.  At that time, I discontinued her Lanoxin and further titrated her metoprolol tartrate from 12.5 twice a day to 25 mg twice a day.  Postoperatively, she had converted initially to sinus rhythm and was in normal sinus rhythm as of her July 2015 evaluation.  A nuclear perfusion study on 01/03/2015 which revealed normal perfusion.  The study was not gated due to her atrial fibrillation.  Apparently, she had recently experienced a presyncopal episode.  She was referred for MR of her brain which raised the possibility of a small nonhemorrhagic infarct within the subcortical white matter of the right postcentral gyrus.  There also is moderate  Age advanced periventricular and subcortical T2 changes.  She is followed by Dr. Buelah Manis. She tells me she is scheduled to have an EEG later this week.  She denies any episodes of chest pain.  She has noticed some mild shortness of breath with exertion.  She notes mild dizziness as she gets up too quickly.  An echo Doppler study in March 2017 showed an EF of 55-60%.  She had trivial aortic insufficiency.  A peak gradient of her aortic valve was 10 mm.  There was mild aortic regurgitation.  She underwent carotid duplex imaging which showed heterogeneous plaque bilaterally.  There were essentially stable 40-59% right internal carotid artery stenoses and 60-79% left internal carotid artery stenoses.  She greater than 50% bilateral external carotid stenosis.  She had normal  subclavian arteries bilaterally with antegrade flow to her vertebrals.  She admits to bilateral neuropathy in both lower extremities.  She denies significant edema.  She had had blood work in [place] in July.  Her cholesterol was 104, triglycerides 103, HDL 38, and LDL 45.  Hemoglobin was 11.4 and  hematocrit 35.7.  She does have reduced iron saturation at 8%.  She presents for evaluation.  Past Medical History:  Diagnosis Date  . Anxiety   . Arthritis   . Atrial fibrillation ([place])   . Bronchial asthma   . CHF (congestive heart failure) ([place])   . Chronic pain    Bacl pain, Disc L5-S1- Dr. Joya Salm in [place]  . COPD (chronic obstructive pulmonary disease) ([place])   . DDD (degenerative disc disease)    Radicular symptoms  . Diabetes mellitus   . History of stress test 06/2011   Abnormal myocardial perfusion study.  Marland Kitchen Hx of echocardiogram    Was interpreted by Dr Doylene Canard that showed an Ef in the 50-60% range with grade 1 diastolic dysfunction. she had moderate MR, biatrial enlargement, moderate TR and at that time estimated RV systolic pressure was 43 mm.  . Hyperlipidemia   . Hypertension   . Shortness of breath   . Tachycardia     Past Surgical History:  Procedure Laterality Date  . ABDOMINAL HYSTERECTOMY     partial  . Breast cyst removed    . CARPAL TUNNEL RELEASE     x 2  . CORONARY ARTERY BYPASS GRAFT N/A 01/09/2014   Procedure: CORONARY ARTERY BYPASS GRAFTING (CABG) x 3 using endoscopically harvested right saphenous vein and left internal mammary artery and closure of left atrial appendage;  Surgeon: Ivin Poot, MD;  Location: [facility] [place];  Service: Open Heart Surgery;  Laterality: N/A;  patient has preop IA BP   . INTRAOPERATIVE TRANSESOPHAGEAL ECHOCARDIOGRAM N/A 01/09/2014   Procedure: INTRAOPERATIVE TRANSESOPHAGEAL ECHOCARDIOGRAM;  Surgeon: Ivin Poot, MD;  Location: [place];  Service: Open Heart Surgery;  Laterality: N/A;  . LEFT HEART CATHETERIZATION WITH CORONARY ANGIOGRAM N/A 01/07/2014   Procedure: LEFT HEART CATHETERIZATION WITH CORONARY ANGIOGRAM;  Surgeon: Lorretta Harp, MD;  Location: [facility] C[facility];  Service: Cardiovascular;  Laterality: N/A;  . TONSILLECTOMY    . TUBAL LIGATION      Allergies  Allergen Reactions  . Adhesive [Tape] Other (See Comments)     Tears skin off.   . Latex Other (See Comments)    In tape, tears skin off.  . Zocor [Simvastatin - High Dose] Nausea And Vomiting    Current Outpatient Prescriptions  Medication Sig Dispense Refill  . ACCU-CHEK AVIVA PLUS test strip USE TO TEST 3 TO 4 TIMES DAILY OR AS DIRECTED. 100 each PRN  . acetaminophen (TYLENOL) 500 MG tablet Take 500 mg by mouth every 6 (six) hours as needed for mild pain.    Marland Kitchen albuterol (PROAIR HFA) 108 (90 BASE) MCG/ACT inhaler Inhale 2 puffs into the lungs every 6 (six) hours as needed for wheezing or shortness of breath. 18 g 11  . ALPRAZolam (XANAX) 1 MG tablet     . aspirin EC 81 MG tablet Take 81 mg by mouth daily.    Marland Kitchen atorvastatin (LIPITOR) 40 MG tablet TAKE ONE TABLET BY MOUTH DAILY. 90 tablet 0  . DIGOX 125 MCG tablet TAKE ONE TABLET BY MOUTH ONCE DAILY. 30 tablet 4  . fluticasone (FLONASE) 50 MCG/ACT nasal spray Place 2 sprays into both nostrils daily. 16 g 6  .  glipiZIDE (GLUCOTROL XL) 2.5 MG 24 hr tablet     . JANUVIA 100 MG tablet TAKE ONE TABLET BY MOUTH DAILY. 30 tablet 0  . lisinopril (PRINIVIL,ZESTRIL) 2.5 MG tablet Take 1 tablet (2.5 mg total) by mouth daily. 30 tablet 11  . metFORMIN (GLUCOPHAGE) 850 MG tablet TAKE 1 TABLET BY MOUTH TWICE DAILY WITH MEALS. 60 tablet 0  . metoprolol (LOPRESSOR) 50 MG tablet Take 2 tablets in the morning and 1 1/2 tablets at night 105 tablet 11  . montelukast (SINGULAIR) 10 MG tablet     . omeprazole (PRILOSEC) 20 MG capsule Take 1 capsule (20 mg total) by mouth 2 (two) times daily. 60 capsule 1  . potassium chloride SA (K-DUR,KLOR-CON) 20 MEQ tablet Take 10 mEq by mouth daily.    Marland Kitchen PRADAXA 150 MG CAPS capsule TAKE ONE CAPSULE BY MOUTH TWICE DAILY. 60 capsule 10  . sodium chloride (OCEAN) 0.65 % nasal spray Place 1 spray into the nose as needed for congestion. Congestion    . SYMBICORT 160-4.5 MCG/ACT inhaler INHALE 2 PUFFS INTO THE LUNGS TWICE DAILY. RINSE MOUTH AFTER USE. 10.2 g 0   No current  facility-administered medications for this visit.     Social History   Social History  . Marital status: Divorced    Spouse name: N/A  . Number of children: 1  . Years of education: 37   Occupational History  . Not on file.   Social History Main Topics  . Smoking status: Former Smoker    Packs/day: 1.50    Years: 30.00    Types: Cigarettes    Quit date: 05/26/2013  . Smokeless tobacco: Never Used     Comment: quit 6 months ago  . Alcohol use No  . Drug use: No  . Sexual activity: Yes    Birth control/ protection: Surgical   Other Topics Concern  . Not on file   Social History Narrative   Lives at home with sister and sister-in-law   Caffeine use : drink 1 cup coffee in morning        Socially she is divorce. She has one deceased child and 2 grandchildren. She quit tobacco at the end of August 2014.  She now has one great grandchild. There is no alcohol use. She does not routinely exercise.  Family History  Problem Relation Age of Onset  . Diabetes Mother   . Heart disease Mother   . Diabetes Sister   . Hypertension Sister   . Hyperlipidemia Sister   . Diabetes Brother   . Heart disease Brother   . Diabetes Brother   . Heart disease Brother    ROS General: Negative; No fevers, chills, or night sweats; positive for fatigue HEENT: Negative; No changes in vision or hearing, sinus congestion, difficulty swallowing Pulmonary: Positive for mild shortness of breath; history of COPD ;No cough, wheezing, , hemoptysis Cardiovascular: Positive for palpitations.  Mild shortness of breath with activity.  No chest pressure. GI: Positive for occasional nausea, no vomiting, diarrhea, or abdominal pain GU: Negative; No dysuria, hematuria, or difficulty voiding Musculoskeletal: Negative; no myalgias, joint pain, or weakness Hematologic/Oncology: Negative; no easy bruising, bleeding Endocrine: Negative; no heat/cold intolerance; positive for diabetes Neuro: Negative; no changes  in balance, headaches Skin: Negative; No rashes or skin lesions Psychiatric: positive for anxiety depression Sleep: Negative; No snoring, daytime sleepiness, hypersomnolence, bruxism, restless legs, hypnogognic hallucinations, no cataplexy Other comprehensive 14 point system review is negative   PE BP 120/64 (BP Location:  Right Arm, Patient Position: Sitting, Cuff Size: Normal)   Pulse 74   Ht 5' 7"  (1.702 m)   Wt 122 lb 3.2 oz (55.4 kg)   SpO2 96%   BMI 19.14 kg/m    Wt Readings from Last 3 Encounters:  07/22/16 122 lb 3.2 oz (55.4 kg)  03/13/16 127 lb (57.6 kg)  09/11/15 138 lb 6.4 oz (62.8 kg)   General: Alert, oriented, no distress.  Skin: normal turgor, no rashes HEENT: Normocephalic, atraumatic. Pupils round and reactive; sclera anicteric;no lid lag.  Nose without nasal septal hypertrophy Mouth/Parynx benign; Mallinpatti scale 2/3 Neck: No JVD,  Bilateral carotid bruit; left greater than right Chest wall: Mild tenderness over the sternotomy site with suggestion of very minimal separation of the upper sternum. Lungs: Decreased breath sound segmenter long-standing tobacco use. no wheezing or rales Heart: PMI is not displaced.  Rhythm is irregularly irregular with a rate of approximately 75bpm; s1 s2 normal 1/6 systolic murmur; no diastolic murmur;  No rubs thrills or heaves Abdomen: soft, nontender; no hepatosplenomehaly, BS+; abdominal aorta nontender and not dilated by palpation. Pulses 2+ Extremities:  Resolution of prior leg edema; no clubbing cyanosis, Homan's sign negative  Neurologic: grossly nonfocal Psychologic: Normal affect and mood  ECG (independently read by me): Atrial fibrillation at 74 bpm with her previously noted chronic right bundle branch lock and T-wave abnormalities.  May 2017 ECG (independently read by me): Atrial fibrillation at 75 bpm with right bundle branch block and repolarization changes  November 2016 ECG (independently read by me):  Atrial  fibrillation with a controlled ventricular response at 69 bpm. Right bundle branch block.  May 2016 ECG (independently read by me):  Atrial fibrillation at 75 bpm.  Right bundle branch block with repolarization changes.  December 2015ECG (independently read by me) atrial fibrillation with a ventricular rate at 10 2 bpm.  Right bundle-branch block with repolarization changes.  Nonspecific ST changes.  09/04/2014 ECG (independently read by me): atrial fibrillation with ventricular response at 1 20 bpm with right bundle branch block, repolarization changes.  04/25/2014 ECG (independently read by me): Normal sinus rhythm at 63 beats per minute.  Recommend followup with repolarization changes.  Prior April 2015 ECG: Normal sinus rhythm with right bundle branch block and repolarization changes.  Prior 10/04/2013 ECG: Atrial fibrillation at a rate At approximately 100-120 beats per minute. Nonspecific ST changes.  LABS:  BMP Latest Ref Rng & Units 03/17/2015 02/20/2015 02/17/2015  Glucose 65 - 99 mg/dL 152(H) 150(H) 137(H)  BUN 6 - 20 mg/dL 10 8 8   Creatinine 0.44 - 1.00 mg/dL 0.53 0.48(L) 0.60  Sodium 135 - 145 mmol/L 135 139 136  Potassium 3.5 - 5.1 mmol/L 3.8 3.2(L) 3.2(L)  Chloride 101 - 111 mmol/L 96(L) 102 100  CO2 22 - 32 mmol/L 27 26 25   Calcium 8.9 - 10.3 mg/dL 9.6 9.5 9.1   Hepatic Function Latest Ref Rng & Units 03/17/2015 02/20/2015 02/17/2015  Total Protein 6.5 - 8.1 g/dL 7.6 6.3 7.1  Albumin 3.5 - 5.0 g/dL 4.2 3.7 4.0  AST 15 - 41 U/L 19 12 18   ALT 14 - 54 U/L 12(L) 8 12  Alk Phosphatase 38 - 126 U/L 59 47 49  Total Bilirubin 0.3 - 1.2 mg/dL 0.9 0.4 0.6  Bilirubin, Direct 0.0 - 0.5 mg/dL - - -   CBC Latest Ref Rng & Units 03/17/2015 02/20/2015 02/17/2015  WBC 4.0 - 10.5 K/uL 13.5(H) 12.0(H) 13.4(H)  Hemoglobin 12.0 - 15.0 g/dL 11.2(L) 11.1(L)  11.9(L)  Hematocrit 36.0 - 46.0 % 34.8(L) 33.5(L) 36.8  Platelets 150 - 400 K/uL 285 206 207   Lab Results  Component Value Date   MCV  92.8 03/17/2015   MCV 92.3 02/20/2015   MCV 91.5 02/17/2015   Lab Results  Component Value Date   TSH 0.698 05/15/2014    Lab Results  Component Value Date   HGBA1C 8.6 (H) 01/25/2015   Lipid Panel     Component Value Date/Time   CHOL 186 09/28/2014 1115   TRIG 144 09/28/2014 1115   HDL 55 09/28/2014 1115   CHOLHDL 3.4 09/28/2014 1115   VLDL 29 09/28/2014 1115   LDLCALC 102 (H) 09/28/2014 1115   LDLDIRECT 66 04/14/2012 1200    RADIOLOGY: No results found.    ASSESSMENT AND PLAN: Ms. Heimann is a 65 year old Caucasian female who has permanent atrial fibrillation and continues to be on Pradaxa for anticoagulation.  She has a history of significant prior tobacco use, but fortunately she has quit smoking.  On 01/09/2014 she underwent CABG surgery for severe multivessel CAD, including left main stenosis.  At that time, she did have a left atrial clip.  Preoperatively, her ejection fraction was 30%.  Postoperatively, her ejection fraction had improved to 55-60%.  He denies any episodes of chest pain or anginal symptomatology.  Following her surgery, she initially was not restarted on Pradaxa since she was maintaining sinus rhythm.   She developed recurrent AF has since been on chronic Pradaxa 150 mg twice a day.  Her latest echo Doppler study continues to show normal systolic function.  When I last saw her, her blood pressure was low and this has now improved and was 120/64 without orthostatic change.  She continues to be on metoprolol 75 mg twice a day and digoxin 0.125 mg daily for rate control of her atrial fibrillation and also was on lisinopril 2.5 mg which is a reduced dose from previously.  She is on atorvastatin 40 mg daily.  Her recent lipid studies are excellent.  I reviewed the complete blood work that had been done by her primary physician.  Of note, she did have reduced total iron 28, and reduced iron saturation at 8%.  I recommended she follow-up with her primary physician.  She  denies any awareness of blood loss in her stool or black tarry stools.  At times she does admit to a sensation that her heart rate increases with minimal activity.  As such, I will titrate her morning dose of metoprolol to 100 mg but she will continue with the reduced dose at 75 mg at nighttime.  She has COPD and continues to be on Symbicort.  She is diabetic on glipizide, Januvia, and metformin.  Her recent hemoglobin A1c was 6.4.  I will see her in 6 months for cardiology reevaluation.   Time spent: 25 minutes  Troy Sine, MD, [facility]  07/24/2016 4:32 PM

## 2016-08-10 DIAGNOSIS — E119 Type 2 diabetes mellitus without complications: Secondary | ICD-10-CM | POA: Diagnosis not present

## 2016-08-10 DIAGNOSIS — E782 Mixed hyperlipidemia: Secondary | ICD-10-CM | POA: Diagnosis not present

## 2016-08-12 DIAGNOSIS — E1140 Type 2 diabetes mellitus with diabetic neuropathy, unspecified: Secondary | ICD-10-CM | POA: Diagnosis not present

## 2016-08-12 DIAGNOSIS — I482 Chronic atrial fibrillation: Secondary | ICD-10-CM | POA: Diagnosis not present

## 2016-08-12 DIAGNOSIS — I2510 Atherosclerotic heart disease of native coronary artery without angina pectoris: Secondary | ICD-10-CM | POA: Diagnosis not present

## 2016-08-12 DIAGNOSIS — J449 Chronic obstructive pulmonary disease, unspecified: Secondary | ICD-10-CM | POA: Diagnosis not present

## 2016-08-12 DIAGNOSIS — G894 Chronic pain syndrome: Secondary | ICD-10-CM | POA: Diagnosis not present

## 2016-08-14 ENCOUNTER — Emergency Department (HOSPITAL_COMMUNITY): Payer: Medicare Other

## 2016-08-14 ENCOUNTER — Emergency Department (HOSPITAL_COMMUNITY)
Admission: EM | Admit: 2016-08-14 | Discharge: 2016-08-14 | Disposition: A | Discharge status: Home / Self Care | Payer: Medicare Other | Attending: Emergency Medicine | Admitting: Emergency Medicine

## 2016-08-14 ENCOUNTER — Encounter (HOSPITAL_COMMUNITY): Payer: Self-pay | Admitting: Emergency Medicine

## 2016-08-14 DIAGNOSIS — W1839XA Other fall on same level, initial encounter: Secondary | ICD-10-CM | POA: Insufficient documentation

## 2016-08-14 DIAGNOSIS — I110 Hypertensive heart disease with heart failure: Secondary | ICD-10-CM | POA: Diagnosis not present

## 2016-08-14 DIAGNOSIS — Z87891 Personal history of nicotine dependence: Secondary | ICD-10-CM | POA: Diagnosis not present

## 2016-08-14 DIAGNOSIS — W19XXXA Unspecified fall, initial encounter: Secondary | ICD-10-CM

## 2016-08-14 DIAGNOSIS — S7002XA Contusion of left hip, initial encounter: Secondary | ICD-10-CM | POA: Diagnosis not present

## 2016-08-14 DIAGNOSIS — Y939 Activity, unspecified: Secondary | ICD-10-CM | POA: Insufficient documentation

## 2016-08-14 DIAGNOSIS — I5031 Acute diastolic (congestive) heart failure: Secondary | ICD-10-CM | POA: Diagnosis not present

## 2016-08-14 DIAGNOSIS — J449 Chronic obstructive pulmonary disease, unspecified: Secondary | ICD-10-CM | POA: Diagnosis not present

## 2016-08-14 DIAGNOSIS — Y929 Unspecified place or not applicable: Secondary | ICD-10-CM | POA: Diagnosis not present

## 2016-08-14 DIAGNOSIS — Z79899 Other long term (current) drug therapy: Secondary | ICD-10-CM | POA: Insufficient documentation

## 2016-08-14 DIAGNOSIS — Y999 Unspecified external cause status: Secondary | ICD-10-CM | POA: Diagnosis not present

## 2016-08-14 DIAGNOSIS — S299XXA Unspecified injury of thorax, initial encounter: Secondary | ICD-10-CM | POA: Diagnosis not present

## 2016-08-14 DIAGNOSIS — Z7984 Long term (current) use of oral hypoglycemic drugs: Secondary | ICD-10-CM | POA: Diagnosis not present

## 2016-08-14 DIAGNOSIS — E119 Type 2 diabetes mellitus without complications: Secondary | ICD-10-CM | POA: Diagnosis not present

## 2016-08-14 DIAGNOSIS — R079 Chest pain, unspecified: Secondary | ICD-10-CM | POA: Diagnosis not present

## 2016-08-14 DIAGNOSIS — M25552 Pain in left hip: Secondary | ICD-10-CM | POA: Diagnosis not present

## 2016-08-14 DIAGNOSIS — S79912A Unspecified injury of left hip, initial encounter: Secondary | ICD-10-CM | POA: Diagnosis not present

## 2016-08-14 DIAGNOSIS — R42 Dizziness and giddiness: Secondary | ICD-10-CM | POA: Insufficient documentation

## 2016-08-14 DIAGNOSIS — I2510 Atherosclerotic heart disease of native coronary artery without angina pectoris: Secondary | ICD-10-CM | POA: Insufficient documentation

## 2016-08-14 DIAGNOSIS — S0990XA Unspecified injury of head, initial encounter: Secondary | ICD-10-CM | POA: Diagnosis not present

## 2016-08-14 HISTORY — DX: Polyneuropathy, unspecified: G62.9

## 2016-08-14 LAB — CBC WITH DIFFERENTIAL/PLATELET
BASOS ABS: 0 10*3/uL (ref 0.0–0.1)
Basophils Relative: 0 %
EOS PCT: 1 %
Eosinophils Absolute: 0.1 10*3/uL (ref 0.0–0.7)
HEMATOCRIT: 31.9 % — AB (ref 36.0–46.0)
Hemoglobin: 10.4 g/dL — ABNORMAL LOW (ref 12.0–15.0)
LYMPHS ABS: 1.7 10*3/uL (ref 0.7–4.0)
LYMPHS PCT: 16 %
MCH: 30 pg (ref 26.0–34.0)
MCHC: 32.6 g/dL (ref 30.0–36.0)
MCV: 91.9 fL (ref 78.0–100.0)
MONO ABS: 0.8 10*3/uL (ref 0.1–1.0)
MONOS PCT: 7 %
NEUTROS ABS: 8.2 10*3/uL — AB (ref 1.7–7.7)
Neutrophils Relative %: 76 %
PLATELETS: 179 10*3/uL (ref 150–400)
RBC: 3.47 MIL/uL — ABNORMAL LOW (ref 3.87–5.11)
RDW: 15.5 % (ref 11.5–15.5)
WBC: 10.7 10*3/uL — ABNORMAL HIGH (ref 4.0–10.5)

## 2016-08-14 LAB — COMPREHENSIVE METABOLIC PANEL
ALBUMIN: 3.9 g/dL (ref 3.5–5.0)
ALT: 14 U/L (ref 14–54)
AST: 19 U/L (ref 15–41)
Alkaline Phosphatase: 50 U/L (ref 38–126)
Anion gap: 5 (ref 5–15)
BUN: 7 mg/dL (ref 6–20)
CHLORIDE: 101 mmol/L (ref 101–111)
CO2: 29 mmol/L (ref 22–32)
CREATININE: 0.53 mg/dL (ref 0.44–1.00)
Calcium: 9.3 mg/dL (ref 8.9–10.3)
GFR calc Af Amer: >60 mL/min (ref >60)
GLUCOSE: 178 mg/dL — AB (ref 65–99)
POTASSIUM: 4 mmol/L (ref 3.5–5.1)
Sodium: 135 mmol/L (ref 135–145)
Total Bilirubin: 0.6 mg/dL (ref 0.3–1.2)
Total Protein: 6.8 g/dL (ref 6.5–8.1)

## 2016-08-14 LAB — URINALYSIS, ROUTINE W REFLEX MICROSCOPIC
Bilirubin Urine: NEGATIVE
GLUCOSE, UA: NEGATIVE mg/dL
Hgb urine dipstick: NEGATIVE
KETONES UR: NEGATIVE mg/dL
LEUKOCYTES UA: NEGATIVE
NITRITE: NEGATIVE
PH: 7 (ref 5.0–8.0)
PROTEIN: NEGATIVE mg/dL
Specific Gravity, Urine: 1.01 (ref 1.005–1.030)

## 2016-08-14 LAB — TROPONIN I

## 2016-08-14 MED ORDER — MECLIZINE HCL 12.5 MG PO TABS
25.0000 mg | ORAL_TABLET | Freq: Once | ORAL | Status: AC
  Administered 2016-08-14: 25 mg via ORAL
  Filled 2016-08-14: qty 2

## 2016-08-14 MED ORDER — MECLIZINE HCL 25 MG PO TABS: 25.0000 mg | ORAL_TABLET | Freq: Two times a day (BID) | ORAL | 0 refills | Status: DC | PRN

## 2016-08-14 NOTE — Discharge Instructions (Signed)
Take the prescription as directed.  Apply moist heat or ice to the area(s) of discomfort, for 15 minutes at a time, several times per day for the next few days.  Do not fall asleep on a heating or ice pack.  Call your regular medical doctor today to schedule a follow up appointment in the next 2 days. Call the Neurologist today to schedule a follow up appointment within the next week.  Return to the Emergency Department immediately if worsening.

## 2016-08-14 NOTE — ED Notes (Signed)
Pt given graham crackers and peanut butter per Dr. Thurnell Garbe.

## 2016-08-14 NOTE — ED Notes (Signed)
Pt made aware to return if symptoms worsen or if any life threatening symptoms occur.   

## 2016-08-14 NOTE — ED Provider Notes (Signed)
[facility] DEPT Provider Note   CSN: [id number] Arrival date & time: 08/14/16  1059     History   Chief Complaint Chief Complaint  Patient presents with  . Fall  . Dizziness    HPI Mary [name] is a 65 y.o. female.  HPI  Pt was seen at 1140. Per pt, c/o sudden onset and resolution of one episode of "dizziness" that occurred early Thursday morning. Pt states she took a new medication (neurontin) last night, and went to bed at 6pm Wednesday. Pt states she woke up at 4am Thursday to go to the bathroom and "felt swimmy headed." Describes her symptoms as "everything was spinning." Pt states she fell into a coffee table, bruising her left hip. Denies syncope, no AMS, no neck or back pain, no visual changes, no facial droop, no slurred speech, no focal motor weakness, no tingling/numbness in extremities, no CP/palpitations, no SOB/cough, no abd pain, no N/V/D.     Past Medical History:  Diagnosis Date  . Anxiety   . Arthritis   . Atrial fibrillation ([name])   . Bronchial asthma   . CHF (congestive heart failure) ([place])   . Chronic pain    Bacl pain, Disc L5-S1- Dr. Joya Salm in [place]  . COPD (chronic obstructive pulmonary disease) ([place])   . DDD (degenerative disc disease)    Radicular symptoms  . Diabetes mellitus   . History of stress test 06/2011   Abnormal myocardial perfusion study.  Marland Kitchen Hx of echocardiogram    Was interpreted by Dr Doylene Canard that showed an Ef in the 50-60% range with grade 1 diastolic dysfunction. she had moderate MR, biatrial enlargement, moderate TR and at that time estimated RV systolic pressure was 43 mm.  . Hyperlipidemia   . Hypertension   . Neuropathy ([place])   . Shortness of breath   . Tachycardia     Patient Active Problem List   Diagnosis Date Noted  . Left carotid bruit 09/13/2015  . Noncompliance with medications 03/19/2015  . Gastroenteritis 03/19/2015  . Seasonal allergies 01/25/2015  . Chest pain 05/16/2014  . HTN (hypertension) 05/16/2014  .  PSVT (paroxysmal supraventricular tachycardia) ([place]) 05/16/2014  . Acute diastolic CHF (congestive heart failure) ([place]) 05/15/2014  . CAD (coronary artery disease) 05/15/2014  . RBBB 04/25/2014  . Benign skin lesion 03/07/2014  . 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 Atrial fibrillation with rapid ventricular response ([place]) 01/06/2014  . Leukocytosis 01/06/2014  . Acute diastolic heart failure ([place]) 01/06/2014  . Fall at home 09/18/2013  . Recurrent falls 09/18/2013  . Tinnitus of left ear 02/19/2013  . Diastolic congestive heart failure ([place]) 09/10/2012  . Atrial fibrillation ([place]) 09/10/2012  . DDD (degenerative disc disease), lumbar 08/26/2012  . Atypical mole 04/14/2012  . Seborrheic keratosis 04/14/2012  . Depression 12/09/2011  . COPD (chronic obstructive pulmonary disease) ([place]) 06/27/2011  . Anxiety 06/27/2011  . Chronic pain 05/27/2011  . Diabetes mellitus ([place]) 05/26/2011  . Hyperlipidemia 05/26/2011    Past Surgical History:  Procedure Laterality Date  . ABDOMINAL HYSTERECTOMY     partial  . Breast cyst removed    . CARPAL TUNNEL RELEASE     x 2  . CORONARY ARTERY BYPASS GRAFT N/A 01/09/2014   Procedure: CORONARY ARTERY BYPASS GRAFTING (CABG) x 3 using endoscopically harvested right saphenous vein and left internal mammary artery and closure of left atrial appendage;  Surgeon: Ivin Poot, MD;  Location: [facility];  Service: Open Heart Surgery;  Laterality: N/A;  patient has preop IA BP   . INTRAOPERATIVE TRANSESOPHAGEAL ECHOCARDIOGRAM N/A 01/09/2014   Procedure: INTRAOPERATIVE TRANSESOPHAGEAL ECHOCARDIOGRAM;  Surgeon: Ivin Poot, MD;  Location: [facility];  Service: Open Heart Surgery;  Laterality: N/A;  . LEFT HEART CATHETERIZATION WITH CORONARY ANGIOGRAM N/A 01/07/2014   Procedure: LEFT HEART CATHETERIZATION WITH CORONARY ANGIOGRAM;  Surgeon: Lorretta Harp, MD;  Location: [facility] C[facility] LAB;  Service: Cardiovascular;  Laterality: N/A;  . TONSILLECTOMY    . TUBAL LIGATION      OB  History    Gravida Para Term Preterm AB Living   1 1 1      0   SAB TAB Ectopic Multiple Live Births                   Home Medications    Prior to Admission medications   Medication Sig Start Date End Date Taking? Authorizing Provider  ACCU-CHEK AVIVA PLUS test strip USE TO TEST 3 TO 4 TIMES DAILY OR AS DIRECTED. 07/20/16  Yes Alycia Rossetti, MD  albuterol (PROAIR HFA) 108 (90 BASE) MCG/ACT inhaler Inhale 2 puffs into the lungs every 6 (six) hours as needed for wheezing or shortness of breath. 08/14/14  Yes Alycia Rossetti, MD  ALPRAZolam Duanne Moron) 1 MG tablet Take 1 mg by mouth 4 (four) times daily.  03/10/16  Yes Historical Provider, MD  aspirin EC 81 MG tablet Take 81 mg by mouth daily.   Yes Historical Provider, MD  atorvastatin (LIPITOR) 40 MG tablet TAKE ONE TABLET BY MOUTH DAILY. 07/20/16  Yes Troy Sine, MD  Bromide 125 MCG tablet TAKE ONE TABLET BY MOUTH ONCE DAILY. 04/01/16  Yes Troy Sine, MD  fluticasone (FLONASE) 50 MCG/ACT nasal spray Place 2 sprays into both nostrils daily. 01/25/15  Yes Alycia Rossetti, MD  gabapentin (NEURONTIN) 100 MG capsule 1 capsule at bedtime. 08/12/16  Yes Historical Provider, MD  glipiZIDE (GLUCOTROL XL) 2.5 MG 24 hr tablet 2.5 mg daily.  02/28/16  Yes Historical Provider, MD  JANUVIA 100 MG tablet TAKE ONE TABLET BY MOUTH DAILY. 07/19/15  Yes Alycia Rossetti, MD  lisinopril (PRINIVIL,ZESTRIL) 2.5 MG tablet Take 1 tablet (2.5 mg total) by mouth daily. 04/15/16  Yes Troy Sine, MD  metFORMIN (GLUCOPHAGE) 850 MG tablet TAKE 1 TABLET BY MOUTH TWICE DAILY WITH MEALS. 07/19/15  Yes Alycia Rossetti, MD  metoprolol (LOPRESSOR) 50 MG tablet Take 2 tablets in the morning and 1 1/2 tablets at night 07/22/16  Yes Troy Sine, MD  potassium chloride SA (K-DUR,KLOR-CON) 20 MEQ tablet Take 10 mEq by mouth daily. 06/18/16  Yes Historical Provider, MD  PRADAXA 150 MG CAPS capsule TAKE ONE CAPSULE BY MOUTH TWICE DAILY. 04/24/16  Yes Troy Sine, MD  SYMBICORT  160-4.5 MCG/ACT inhaler INHALE 2 PUFFS INTO THE LUNGS TWICE DAILY. RINSE MOUTH AFTER USE. 06/04/15  Yes Alycia Rossetti, MD  acetaminophen (TYLENOL) 500 MG tablet Take 500 mg by mouth every 6 (six) hours as needed for mild pain.    Historical Provider, MD  sodium chloride (OCEAN) 0.65 % nasal spray Place 1 spray into the nose as needed for congestion. Congestion    Historical Provider, MD    Family History Family History  Problem Relation Age of Onset  . Diabetes Mother   . Heart disease Mother   . Diabetes Sister   . Hypertension Sister   . Hyperlipidemia Sister   . Diabetes Brother   . Heart disease Brother   .  Diabetes Brother   . Heart disease Brother     Social History Social History  Substance Use Topics  . Smoking status: Former Smoker    Packs/day: 1.50    Years: 30.00    Types: Cigarettes    Quit date: 05/26/2013  . Smokeless tobacco: Never Used     Comment: quit 6 months ago  . Alcohol use No     Allergies   Adhesive [tape]; Latex; and Zocor [simvastatin - high dose]   Review of Systems Review of Systems ROS: Statement: All systems negative except as marked or noted in the HPI; Constitutional: Negative for fever and chills. ; ; Eyes: Negative for eye pain, redness and discharge. ; ; ENMT: Negative for ear pain, hoarseness, nasal congestion, sinus pressure and sore throat. ; ; Cardiovascular: Negative for chest pain, palpitations, diaphoresis, dyspnea and peripheral edema. ; ; Respiratory: Negative for cough, wheezing and stridor. ; ; Gastrointestinal: Negative for nausea, vomiting, diarrhea, abdominal pain, blood in stool, hematemesis, jaundice and rectal bleeding. . ; ; Genitourinary: Negative for dysuria, flank pain and hematuria. ; ; Musculoskeletal: +left hip pain. Negative for back pain and neck pain. Negative for swelling and deformity.; ; Skin: +bruising. Negative for pruritus, rash, abrasions, blisters, and skin lesion.; ; Neuro: +"dizziness." Negative for  headache, lightheadedness and neck stiffness. Negative for weakness, altered level of consciousness, altered mental status, extremity weakness, paresthesias, involuntary movement, seizure and syncope.      Physical Exam Updated Vital Signs BP 120/63 (BP Location: Right Arm)   Pulse 88   Temp 98.3 F (36.8 C) (Oral)   Resp 15   Ht 5\' 7"  (1.702 m)   Wt 118 lb (53.5 kg)   SpO2 93%   BMI 18.48 kg/m   13:30 Orthostatic Vital Signs AD  Orthostatic Lying   BP- Lying: 120/71  Pulse- Lying: 86      Orthostatic Sitting  BP- Sitting: 120/62  Pulse- Sitting: 84      Orthostatic Standing at 0 minutes  BP- Standing at 0 minutes: 121/70  Pulse- Standing at 0 minutes: 86    Physical Exam 1145: Physical examination: Vital signs and O2 SAT: Reviewed; Constitutional: Well developed, Well nourished, Well hydrated, In no acute distress; Head and Face: Normocephalic, Atraumatic; Eyes: EOMI, PERRL, No scleral icterus; ENMT: Mouth and pharynx normal, Left TM normal, Right TM normal, Mucous membranes moist; Neck: Supple, Trachea midline; Spine: No midline CS, TS, LS tenderness.; Cardiovascular: Irregular rate and rhythm, No gallop; Respiratory: Breath sounds clear & equal bilaterally, No wheezes, Normal respiratory effort/excursion; Chest: Nontender, No deformity, Movement normal, No crepitus, No abrasions or ecchymosis.; Abdomen: Soft, Nontender, Nondistended, Normal bowel sounds, No abrasions or ecchymosis.; Genitourinary: No CVA tenderness;; Extremities: No deformity, Full range of motion major/large joints of bilat UE's and LE's without pain or tenderness to palp, Neurovascularly intact, Pulses normal, +contusion left hip tender to palp, no erythema, no open wounds. No edema, Pelvis stable; Neuro: AA&Ox3, Major CN grossly intact. Speech clear.  No facial droop. +left horizontal end gaze fatigable nystagmus. Grips equal. Strength 5/5 equal bilat UE's and LE's.  DTR 2/4 equal bilat UE's and LE's.  No  gross sensory deficits.  Normal cerebellar testing bilat UE's (finger-nose) and LE's (heel-shin)..; Skin: Color normal, Warm, Dry    ED Treatments / Results  Labs (all labs ordered are listed, but only abnormal results are displayed)   EKG  EKG Interpretation  Date/Time:  Friday August 14 2016 12:09:39 EDT Ventricular Rate:  66 PR  Interval:    QRS Duration: 131 QT Interval:  384 QTC Calculation: 403 R Axis:   89 Text Interpretation:  Atrial fibrillation Right bundle branch block When compared with ECG of 02/16/2015 Atrial fibrillation has replaced Normal sinus rhythm Confirmed by [facility]  MD, Nunzio Cory [phone]) on 08/14/2016 12:36:05 PM       Radiology   Procedures Procedures (including critical care time)  Medications Ordered in ED Medications  meclizine (ANTIVERT) tablet 25 mg (25 mg Oral Given 08/14/16 1215)     Initial Impression / Assessment and Plan / ED Course  I have reviewed the triage vital signs and the nursing notes.  Pertinent labs & imaging results that were available during my care of the patient were reviewed by me and considered in my medical decision making (see chart for details).  MDM Reviewed: previous chart, nursing note and vitals Reviewed previous: labs and ECG Interpretation: labs, ECG, x-ray and MRI   Results for orders placed or performed during the hospital encounter of 08/14/16  Urinalysis, Routine w reflex microscopic  Result Value Ref Range   Color, Urine YELLOW YELLOW   APPearance CLEAR CLEAR   Specific Gravity, Urine 1.010 1.005 - 1.030   pH 7.0 5.0 - 8.0   Glucose, UA NEGATIVE NEGATIVE mg/dL   Hgb urine dipstick NEGATIVE NEGATIVE   Bilirubin Urine NEGATIVE NEGATIVE   Ketones, ur NEGATIVE NEGATIVE mg/dL   Protein, ur NEGATIVE NEGATIVE mg/dL   Nitrite NEGATIVE NEGATIVE   Leukocytes, UA NEGATIVE NEGATIVE  Comprehensive metabolic panel  Result Value Ref Range   Sodium 135 135 - 145 mmol/L   Potassium 4.0 3.5 - 5.1 mmol/L    Chloride 101 101 - 111 mmol/L   CO2 29 22 - 32 mmol/L   Glucose, Bld 178 (H) 65 - 99 mg/dL   BUN 7 6 - 20 mg/dL   Creatinine, Ser 0.53 0.44 - 1.00 mg/dL   Calcium 9.3 8.9 - 10.3 mg/dL   Total Protein 6.8 6.5 - 8.1 g/dL   Albumin 3.9 3.5 - 5.0 g/dL   AST 19 15 - 41 U/L   ALT 14 14 - 54 U/L   Alkaline Phosphatase 50 38 - 126 U/L   Total Bilirubin 0.6 0.3 - 1.2 mg/dL   GFR calc non Af Amer >60 >60 mL/min   GFR calc Af Amer >60 >60 mL/min   Anion gap 5 5 - 15  Troponin I  Result Value Ref Range   Troponin I <0.03 <0.03 ng/mL  CBC with Differential  Result Value Ref Range   WBC 10.7 (H) 4.0 - 10.5 K/uL   RBC 3.47 (L) 3.87 - 5.11 MIL/uL   Hemoglobin 10.4 (L) 12.0 - 15.0 g/dL   HCT 31.9 (L) 36.0 - 46.0 %   MCV 91.9 78.0 - 100.0 fL   MCH 30.0 26.0 - 34.0 pg   MCHC 32.6 30.0 - 36.0 g/dL   RDW 15.5 11.5 - 15.5 %   Platelets 179 150 - 400 K/uL   Neutrophils Relative % 76 %   Neutro Abs 8.2 (H) 1.7 - 7.7 K/uL   Lymphocytes Relative 16 %   Lymphs Abs 1.7 0.7 - 4.0 K/uL   Monocytes Relative 7 %   Monocytes Absolute 0.8 0.1 - 1.0 K/uL   Eosinophils Relative 1 %   Eosinophils Absolute 0.1 0.0 - 0.7 K/uL   Basophils Relative 0 %   Basophils Absolute 0.0 0.0 - 0.1 K/uL   Dg Chest 2 View Result Date: 08/14/2016 CLINICAL DATA:  Pain following fall 1 day prior EXAM: CHEST  2 VIEW COMPARISON:  Mar 17, 2015 FINDINGS: There is no edema or consolidation. The heart size and pulmonary vascularity are normal. No adenopathy. There is atherosclerotic calcification in aortic arch region. Patient is status post coronary artery bypass grafting. There is a left atrial appendage clamp. No bone lesions are evident. No pneumothorax. IMPRESSION: No edema or consolidation. Aortic atherosclerosis. Postoperative change as noted. Electronically Signed   By: Lowella Grip III M.D.   On: 08/14/2016 13:24   Mr Brain Wo Contrast (neuro Protocol) Result Date: 08/14/2016 CLINICAL DATA:  65 year old female with  dizziness since 0400 hours yesterday, fall in to coffee table. Initial encounter. EXAM: MRI HEAD WITHOUT CONTRAST TECHNIQUE: Multiplanar, multiecho pulse sequences of the brain and surrounding structures were obtained without intravenous contrast. COMPARISON:  Brain MRI 02/27/2015 and earlier. FINDINGS: Brain: Stable cerebral volume. No restricted diffusion to suggest acute infarction. No midline shift, mass effect, evidence of mass lesion, ventriculomegaly, extra-axial collection or acute intracranial hemorrhage. Cervicomedullary junction and pituitary are within normal limits. Scattered and patchy bilateral cerebral white matter T2 and FLAIR hyperintensity is stable since 2016. Patchy T2 hyperintensity in the pons is stable slightly greater on the right. Mild T2 heterogeneity in the deep gray matter nuclei is stable in appears mostly due to perivascular spaces. Negative cerebellum. No cortical encephalomalacia. No chronic cerebral blood products identified. Vascular: Major intracranial vascular flow voids are stable and within normal limits. Pneumatized left anterior clinoid process suspected. Skull and upper cervical spine: Negative aside from degenerative ligamentous hypertrophy about the odontoid. Visualized bone marrow signal is within normal limits. Sinuses/Orbits: Normal orbits soft tissues. Visualized paranasal sinuses and mastoids are stable and well pneumatized. Other: Chronic fluid signal at the left petrous apex, not significantly changed compared to 10/27/2013 or the head CT on 10/25/2013. The left petrous apex was slightly better pneumatized in 2016. Negative nasopharynx. Visible internal auditory structures appear normal. Mastoids remain clear. Negative scalp soft tissues. IMPRESSION: 1. No acute intracranial abnormality. Stable noncontrast MRI appearance of the brain since 2016 with moderate for age signal changes most commonly due to chronic small vessel disease. 2. Chronic/intermittent fluid  signal in the left petrous apex, likely postinflammatory and acute clinical significance doubtful. Electronically Signed   By: Genevie Ann M.D.   On: 08/14/2016 13:11   Dg Hip Unilat With Pelvis 2-3 Views Left Result Date: 08/14/2016 CLINICAL DATA:  Pain following fall EXAM: DG HIP (WITH OR WITHOUT PELVIS) 2-3V LEFT COMPARISON:  None. FINDINGS: Frontal pelvis as well as frontal and lateral left hip images were obtained. There is no fracture or dislocation. Joint spaces appear unremarkable. No erosive change. There is extensive arterial vascular calcification in the common iliac, external iliac, and visualized superficial femoral arteries bilaterally. IMPRESSION: Multilevel arterial atherosclerosis. No fracture or dislocation. No apparent arthropathy. Electronically Signed   By: Lowella Grip III M.D.   On: 08/14/2016 13:22    1455:  Not orthostatic on VS. Pt has ambulated with steady gait, easy resps, NAD. Feels improved after meds and wants to go home now. Tx symptomatically. Dx and testing d/w pt and family.  Questions answered.  Verb understanding, agreeable to d/c home with outpt f/u.    Final Clinical Impressions(s) / ED Diagnoses   Final diagnoses:  None    New Prescriptions New Prescriptions   No medications on file     Francine Graven, DO 08/17/16 1600

## 2016-08-14 NOTE — ED Triage Notes (Addendum)
PT states yesterday morning around 4am pt woke up to go use the bathroom and had some dizziness and fell onto her coffee table but denies LOC. PT c/o left hip pain with bruising from fall.

## 2016-08-16 LAB — URINE CULTURE

## 2016-08-18 ENCOUNTER — Other Ambulatory Visit: Payer: Self-pay | Admitting: Cardiovascular Disease

## 2016-08-19 NOTE — Telephone Encounter (Signed)
Rx(s) sent to pharmacy electronically.  

## 2016-08-25 DIAGNOSIS — I509 Heart failure, unspecified: Secondary | ICD-10-CM | POA: Diagnosis not present

## 2016-08-25 DIAGNOSIS — I482 Chronic atrial fibrillation: Secondary | ICD-10-CM | POA: Diagnosis not present

## 2016-08-25 DIAGNOSIS — S7012XA Contusion of left thigh, initial encounter: Secondary | ICD-10-CM | POA: Diagnosis not present

## 2016-08-25 DIAGNOSIS — E1340 Other specified diabetes mellitus with diabetic neuropathy, unspecified: Secondary | ICD-10-CM | POA: Diagnosis not present

## 2016-08-25 DIAGNOSIS — I2510 Atherosclerotic heart disease of native coronary artery without angina pectoris: Secondary | ICD-10-CM | POA: Diagnosis not present

## 2016-09-17 ENCOUNTER — Encounter (HOSPITAL_COMMUNITY): Payer: Self-pay | Admitting: Emergency Medicine

## 2016-09-17 ENCOUNTER — Emergency Department (HOSPITAL_COMMUNITY)
Admission: EM | Admit: 2016-09-17 | Discharge: 2016-09-17 | Disposition: A | Discharge status: Home / Self Care | Payer: Medicare Other | Attending: Emergency Medicine | Admitting: Emergency Medicine

## 2016-09-17 ENCOUNTER — Emergency Department (HOSPITAL_COMMUNITY): Payer: Medicare Other

## 2016-09-17 DIAGNOSIS — Z7984 Long term (current) use of oral hypoglycemic drugs: Secondary | ICD-10-CM | POA: Insufficient documentation

## 2016-09-17 DIAGNOSIS — I2510 Atherosclerotic heart disease of native coronary artery without angina pectoris: Secondary | ICD-10-CM | POA: Insufficient documentation

## 2016-09-17 DIAGNOSIS — R531 Weakness: Secondary | ICD-10-CM | POA: Diagnosis not present

## 2016-09-17 DIAGNOSIS — J449 Chronic obstructive pulmonary disease, unspecified: Secondary | ICD-10-CM | POA: Diagnosis not present

## 2016-09-17 DIAGNOSIS — E119 Type 2 diabetes mellitus without complications: Secondary | ICD-10-CM | POA: Diagnosis not present

## 2016-09-17 DIAGNOSIS — R1110 Vomiting, unspecified: Secondary | ICD-10-CM | POA: Insufficient documentation

## 2016-09-17 DIAGNOSIS — R05 Cough: Secondary | ICD-10-CM | POA: Diagnosis not present

## 2016-09-17 DIAGNOSIS — Z79899 Other long term (current) drug therapy: Secondary | ICD-10-CM | POA: Diagnosis not present

## 2016-09-17 DIAGNOSIS — Z87891 Personal history of nicotine dependence: Secondary | ICD-10-CM | POA: Insufficient documentation

## 2016-09-17 DIAGNOSIS — I5031 Acute diastolic (congestive) heart failure: Secondary | ICD-10-CM | POA: Diagnosis not present

## 2016-09-17 DIAGNOSIS — Z7982 Long term (current) use of aspirin: Secondary | ICD-10-CM | POA: Diagnosis not present

## 2016-09-17 DIAGNOSIS — M791 Myalgia: Secondary | ICD-10-CM | POA: Insufficient documentation

## 2016-09-17 DIAGNOSIS — I110 Hypertensive heart disease with heart failure: Secondary | ICD-10-CM | POA: Insufficient documentation

## 2016-09-17 DIAGNOSIS — M79652 Pain in left thigh: Secondary | ICD-10-CM | POA: Diagnosis not present

## 2016-09-17 DIAGNOSIS — R0602 Shortness of breath: Secondary | ICD-10-CM | POA: Insufficient documentation

## 2016-09-17 LAB — CBC WITH DIFFERENTIAL/PLATELET
BASOS ABS: 0 10*3/uL (ref 0.0–0.1)
Basophils Relative: 0 %
Eosinophils Absolute: 0 10*3/uL (ref 0.0–0.7)
Eosinophils Relative: 0 %
HEMATOCRIT: 34.4 % — AB (ref 36.0–46.0)
HEMOGLOBIN: 11.2 g/dL — AB (ref 12.0–15.0)
LYMPHS PCT: 8 %
Lymphs Abs: 1.3 10*3/uL (ref 0.7–4.0)
MCH: 29.5 pg (ref 26.0–34.0)
MCHC: 32.6 g/dL (ref 30.0–36.0)
MCV: 90.5 fL (ref 78.0–100.0)
MONO ABS: 1.1 10*3/uL — AB (ref 0.1–1.0)
MONOS PCT: 7 %
NEUTROS ABS: 13.8 10*3/uL — AB (ref 1.7–7.7)
Neutrophils Relative %: 85 %
Platelets: 268 10*3/uL (ref 150–400)
RBC: 3.8 MIL/uL — ABNORMAL LOW (ref 3.87–5.11)
RDW: 15.3 % (ref 11.5–15.5)
WBC: 16.2 10*3/uL — ABNORMAL HIGH (ref 4.0–10.5)

## 2016-09-17 LAB — COMPREHENSIVE METABOLIC PANEL
ALK PHOS: 66 U/L (ref 38–126)
ALT: 15 U/L (ref 14–54)
AST: 24 U/L (ref 15–41)
Albumin: 4.5 g/dL (ref 3.5–5.0)
Anion gap: 12 (ref 5–15)
BILIRUBIN TOTAL: 1.3 mg/dL — AB (ref 0.3–1.2)
BUN: 14 mg/dL (ref 6–20)
CALCIUM: 10 mg/dL (ref 8.9–10.3)
CO2: 26 mmol/L (ref 22–32)
CREATININE: 0.58 mg/dL (ref 0.44–1.00)
Chloride: 95 mmol/L — ABNORMAL LOW (ref 101–111)
GFR calc Af Amer: >60 mL/min (ref >60)
Glucose, Bld: 148 mg/dL — ABNORMAL HIGH (ref 65–99)
POTASSIUM: 4.1 mmol/L (ref 3.5–5.1)
Sodium: 133 mmol/L — ABNORMAL LOW (ref 135–145)
TOTAL PROTEIN: 7.8 g/dL (ref 6.5–8.1)

## 2016-09-17 LAB — BRAIN NATRIURETIC PEPTIDE: B NATRIURETIC PEPTIDE 5: 514 pg/mL — AB (ref 0.0–100.0)

## 2016-09-17 LAB — TROPONIN I: Troponin I: <0.03 ng/mL (ref <0.03)

## 2016-09-17 MED ORDER — AZITHROMYCIN 250 MG PO TABS
ORAL_TABLET | ORAL | Status: AC
  Filled 2016-09-17: qty 2

## 2016-09-17 MED ORDER — AZITHROMYCIN 250 MG PO TABS: ORAL_TABLET | ORAL | 0 refills | Status: DC

## 2016-09-17 MED ORDER — FUROSEMIDE 20 MG PO TABS: 20.0000 mg | ORAL_TABLET | Freq: Every day | ORAL | 0 refills | Status: DC

## 2016-09-17 MED ORDER — SODIUM CHLORIDE 0.9 % IV BOLUS (SEPSIS)
500.0000 mL | Freq: Once | INTRAVENOUS | Status: AC
  Administered 2016-09-17: 500 mL via INTRAVENOUS

## 2016-09-17 MED ORDER — AZITHROMYCIN 250 MG PO TABS
500.0000 mg | ORAL_TABLET | Freq: Once | ORAL | Status: AC
  Administered 2016-09-17: 500 mg via ORAL

## 2016-09-17 MED ORDER — ONDANSETRON 4 MG PO TBDP: ORAL_TABLET | ORAL | 0 refills | Status: DC

## 2016-09-17 NOTE — ED Provider Notes (Addendum)
[facility] DEPT Provider Note   CSN: [id number] Arrival date & time: 09/17/16  1131  By signing my name below, I, Hansel Feinstein, attest that this documentation has been prepared under the direction and in the presence of Milton Ferguson, MD. Electronically Signed: Hansel Feinstein, ED Scribe. 09/17/16. 11:50 AM.     History   Chief Complaint Chief Complaint  Patient presents with  . Weakness    generalized  . Emesis    HPI Mary [name] is a 65 y.o. female with extensive PMHx who presents to the Emergency Department complaining of gradually worsening SOB onset 2 days ago with associated productive cough with white phlegm, post-tussive emesis. Pt states she is unable to tolerate food and fluids. No alleviating factors noted. She also reports mild left thigh pain since a fall 2 months ago, but denies recent re-injury or fall. Pt denies diarrhea, fever, additional symptoms or complaints.   The history is provided by the patient. No language interpreter was used.  Cough  This is a new problem. The current episode started 2 days ago. The problem occurs every few minutes. The problem has been gradually worsening. The cough is productive of sputum. There has been no fever. Associated symptoms include myalgias and shortness of breath. Her past medical history is significant for COPD and asthma.    Past Medical History:  Diagnosis Date  . Anxiety   . Arthritis   . Atrial fibrillation ([place])   . Bronchial asthma   . CHF (congestive heart failure) ([place])   . Chronic pain    Bacl pain, Disc L5-S1- Dr. Joya Salm in [place]  . COPD (chronic obstructive pulmonary disease) ([place])   . DDD (degenerative disc disease)    Radicular symptoms  . Diabetes mellitus   . History of stress test 06/2011   Abnormal myocardial perfusion study.  Marland Kitchen Hx of echocardiogram    Was interpreted by Dr Doylene Canard that showed an Ef in the 50-60% range with grade 1 diastolic dysfunction. she had moderate MR, biatrial enlargement,  moderate TR and at that time estimated RV systolic pressure was 43 mm.  . Hyperlipidemia   . Hypertension   . Neuropathy ([place])   . Shortness of breath   . Tachycardia     Patient Active Problem List   Diagnosis Date Noted  . Left carotid bruit 09/13/2015  . Noncompliance with medications 03/19/2015  . Gastroenteritis 03/19/2015  . Seasonal allergies 01/25/2015  . Chest pain 05/16/2014  . HTN (hypertension) 05/16/2014  . PSVT (paroxysmal supraventricular tachycardia) ([place]) 05/16/2014  . Acute diastolic CHF (congestive heart failure) ([place]) 05/15/2014  . CAD (coronary artery disease) 05/15/2014  . RBBB 04/25/2014  . Benign skin lesion 03/07/2014  . 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 Atrial fibrillation with rapid ventricular response ([place]) 01/06/2014  . Leukocytosis 01/06/2014  . Acute diastolic heart failure ([place]) 01/06/2014  . Fall at home 09/18/2013  . Recurrent falls 09/18/2013  . Tinnitus of left ear 02/19/2013  . Diastolic congestive heart failure ([place]) 09/10/2012  . Atrial fibrillation ([place]) 09/10/2012  . DDD (degenerative disc disease), lumbar 08/26/2012  . Atypical mole 04/14/2012  . Seborrheic keratosis 04/14/2012  . Depression 12/09/2011  . COPD (chronic obstructive pulmonary disease) ([place]) 06/27/2011  . Anxiety 06/27/2011  . Chronic pain 05/27/2011  . Diabetes mellitus ([place]) 05/26/2011  . Hyperlipidemia 05/26/2011    Past Surgical History:  Procedure Laterality Date  . ABDOMINAL HYSTERECTOMY     partial  . Breast cyst removed    .  CARPAL TUNNEL RELEASE     x 2  . CORONARY ARTERY BYPASS GRAFT N/A 01/09/2014   Procedure: CORONARY ARTERY BYPASS GRAFTING (CABG) x 3 using endoscopically harvested right saphenous vein and left internal mammary artery and closure of left atrial appendage;  Surgeon: Ivin Poot, MD;  Location: [facility];  Service: Open Heart Surgery;  Laterality: N/A;  patient has preop IA BP   . INTRAOPERATIVE TRANSESOPHAGEAL ECHOCARDIOGRAM N/A 01/09/2014    Procedure: INTRAOPERATIVE TRANSESOPHAGEAL ECHOCARDIOGRAM;  Surgeon: Ivin Poot, MD;  Location: [facility];  Service: Open Heart Surgery;  Laterality: N/A;  . LEFT HEART CATHETERIZATION WITH CORONARY ANGIOGRAM N/A 01/07/2014   Procedure: LEFT HEART CATHETERIZATION WITH CORONARY ANGIOGRAM;  Surgeon: Lorretta Harp, MD;  Location: [facility] CATH LAB;  Service: Cardiovascular;  Laterality: N/A;  . TONSILLECTOMY    . TUBAL LIGATION      OB History    Gravida Para Term Preterm AB Living   1 1 1      0   SAB TAB Ectopic Multiple Live Births                   Home Medications    Prior to Admission medications   Medication Sig Start Date End Date Taking? Authorizing Provider  ACCU-CHEK AVIVA PLUS test strip USE TO TEST 3 TO 4 TIMES DAILY OR AS DIRECTED. 07/20/16   Alycia Rossetti, MD  acetaminophen (TYLENOL) 500 MG tablet Take 500 mg by mouth every 6 (six) hours as needed for mild pain.    Historical Provider, MD  albuterol (PROAIR HFA) 108 (90 BASE) MCG/ACT inhaler Inhale 2 puffs into the lungs every 6 (six) hours as needed for wheezing or shortness of breath. 08/14/14   Alycia Rossetti, MD  ALPRAZolam Duanne Moron) 1 MG tablet Take 1 mg by mouth 4 (four) times daily.  03/10/16   Historical Provider, MD  aspirin EC 81 MG tablet Take 81 mg by mouth daily.    Historical Provider, MD  atorvastatin (LIPITOR) 40 MG tablet TAKE ONE TABLET BY MOUTH DAILY. 07/20/16   Troy Sine, MD  [place] 125 MCG tablet TAKE ONE TABLET BY MOUTH ONCE DAILY. 08/19/16   Troy Sine, MD  fluticasone (FLONASE) 50 MCG/ACT nasal spray Place 2 sprays into both nostrils daily. 01/25/15   Alycia Rossetti, MD  gabapentin (NEURONTIN) 100 MG capsule 1 capsule at bedtime. 08/12/16   Historical Provider, MD  glipiZIDE (GLUCOTROL XL) 2.5 MG 24 hr tablet 2.5 mg daily.  02/28/16   Historical Provider, MD  JANUVIA 100 MG tablet TAKE ONE TABLET BY MOUTH DAILY. 07/19/15   Alycia Rossetti, MD  lisinopril (PRINIVIL,ZESTRIL) 2.5 MG tablet Take 1  tablet (2.5 mg total) by mouth daily. 04/15/16   Troy Sine, MD  meclizine (ANTIVERT) 25 MG tablet Take 1 tablet (25 mg total) by mouth 2 (two) times daily as needed for dizziness. 08/14/16   Francine Graven, DO  metFORMIN (GLUCOPHAGE) 850 MG tablet TAKE 1 TABLET BY MOUTH TWICE DAILY WITH MEALS. 07/19/15   Alycia Rossetti, MD  metoprolol (LOPRESSOR) 50 MG tablet Take 2 tablets in the morning and 1 1/2 tablets at night 07/22/16   Troy Sine, MD  potassium chloride SA (K-DUR,KLOR-CON) 20 MEQ tablet Take 10 mEq by mouth daily. 06/18/16   Historical Provider, MD  PRADAXA 150 MG CAPS capsule TAKE ONE CAPSULE BY MOUTH TWICE DAILY. 04/24/16   Troy Sine, MD  sodium chloride (OCEAN) 0.65 % nasal spray  Place 1 spray into the nose as needed for congestion. Congestion    Historical Provider, MD  SYMBICORT 160-4.5 MCG/ACT inhaler INHALE 2 PUFFS INTO THE LUNGS TWICE DAILY. RINSE MOUTH AFTER USE. 06/04/15   Alycia Rossetti, MD    Family History Family History  Problem Relation Age of Onset  . Diabetes Mother   . Heart disease Mother   . Diabetes Sister   . Hypertension Sister   . Hyperlipidemia Sister   . Diabetes Brother   . Heart disease Brother   . Diabetes Brother   . Heart disease Brother     Social History Social History  Substance Use Topics  . Smoking status: Former Smoker    Packs/day: 1.50    Years: 30.00    Types: Cigarettes    Quit date: 05/26/2013  . Smokeless tobacco: Never Used     Comment: quit 6 months ago  . Alcohol use No     Allergies   Adhesive [tape]; Latex; and Zocor [simvastatin - high dose]   Review of Systems Review of Systems  Constitutional: Negative for appetite change, fatigue and fever.  HENT: Negative for congestion, ear discharge and sinus pressure.   Eyes: Negative for discharge.  Respiratory: Positive for cough and shortness of breath.   Gastrointestinal: Positive for vomiting. Negative for diarrhea.  Genitourinary: Negative for frequency  and hematuria.  Musculoskeletal: Positive for myalgias. Negative for back pain.  Skin: Negative for rash.  Neurological: Negative for seizures.  Psychiatric/Behavioral: Negative for hallucinations.     Physical Exam Updated Vital Signs Ht 5\' 7"  (1.702 m)   Wt 116 lb (52.6 kg)   BMI 18.17 kg/m   Physical Exam  Constitutional: She is oriented to person, place, and time. She appears well-developed.  HENT:  Head: Normocephalic.  Eyes: Conjunctivae and EOM are normal. No scleral icterus.  Neck: Neck supple. No thyromegaly present.  Cardiovascular: Regular rhythm.  Exam reveals no gallop and no friction rub.   No murmur heard. Irregular rhythm  Pulmonary/Chest: No stridor. She has wheezes. She has no rales. She exhibits no tenderness.  Minimal wheezes bilaterally.  Abdominal: She exhibits no distension. There is no tenderness. There is no rebound.  Musculoskeletal: Normal range of motion. She exhibits tenderness. She exhibits no edema.   Mild tenderness to left thigh   Lymphadenopathy:    She has no cervical adenopathy.  Neurological: She is oriented to person, place, and time. She exhibits normal muscle tone. Coordination normal.  Skin: No rash noted. No erythema.  Psychiatric: She has a normal mood and affect. Her behavior is normal.  Nursing note and vitals reviewed.    ED Treatments / Results    COORDINATION OF CARE: 11:47 AM Discussed treatment plan with pt at bedside which includes CXR and pt agreed to plan.    Labs (all labs ordered are listed, but only abnormal results are displayed) Labs Reviewed - No data to display  EKG  EKG Interpretation None       Radiology No results found.  Procedures Procedures (including critical care time)  Medications Ordered in ED Medications - No data to display   Initial Impression / Assessment and Plan / ED Course  I have reviewed the triage vital signs and the nursing notes.  Pertinent labs & imaging results that  were available during my care of the patient were reviewed by me and considered in my medical decision making (see chart for details).  Clinical Course     Labs unremarkable  except for slightly elevated BNP. Patient will be given some Lasix for a few days. Also nausea and vomiting and will be covered with a Z-Pak for possible respiratory infection  Final Clinical Impressions(s) / ED Diagnoses   Final diagnoses:  None    New Prescriptions New Prescriptions   No medications on file       Milton Ferguson, MD 09/17/16 [place], MD 09/17/16 1330

## 2016-09-17 NOTE — ED Notes (Signed)
Pt transported to xray at this time

## 2016-09-17 NOTE — ED Triage Notes (Signed)
Pt reports generalized weakness started yesterday with episodes of V/ "phlegm". Pt unable to eat or drink. A & O X4, no deficits noted, pt cbg pta was 132 per pt.

## 2016-09-17 NOTE — Discharge Instructions (Signed)
Drink plenty of fluids and follow up with your md next week °

## 2016-09-21 ENCOUNTER — Other Ambulatory Visit: Payer: Self-pay | Admitting: Cardiovascular Disease

## 2016-09-21 NOTE — Telephone Encounter (Signed)
REFILL 

## 2016-11-04 ENCOUNTER — Other Ambulatory Visit: Payer: Self-pay

## 2016-11-04 ENCOUNTER — Encounter (HOSPITAL_COMMUNITY): Payer: Self-pay | Admitting: *Deleted

## 2016-11-04 ENCOUNTER — Emergency Department (HOSPITAL_COMMUNITY): Payer: Medicare Other

## 2016-11-04 ENCOUNTER — Inpatient Hospital Stay (HOSPITAL_COMMUNITY)
Admission: EM | Admit: 2016-11-04 | Discharge: 2016-11-06 | DRG: 190 | Disposition: A | Discharge status: Home / Self Care | Payer: Medicare Other | Attending: Internal Medicine | Admitting: Internal Medicine

## 2016-11-04 DIAGNOSIS — J449 Chronic obstructive pulmonary disease, unspecified: Secondary | ICD-10-CM | POA: Diagnosis present

## 2016-11-04 DIAGNOSIS — I4891 Unspecified atrial fibrillation: Secondary | ICD-10-CM | POA: Diagnosis present

## 2016-11-04 DIAGNOSIS — I2510 Atherosclerotic heart disease of native coronary artery without angina pectoris: Secondary | ICD-10-CM | POA: Diagnosis not present

## 2016-11-04 DIAGNOSIS — Z8249 Family history of ischemic heart disease and other diseases of the circulatory system: Secondary | ICD-10-CM | POA: Diagnosis not present

## 2016-11-04 DIAGNOSIS — J441 Chronic obstructive pulmonary disease with (acute) exacerbation: Principal | ICD-10-CM | POA: Diagnosis present

## 2016-11-04 DIAGNOSIS — R05 Cough: Secondary | ICD-10-CM | POA: Diagnosis not present

## 2016-11-04 DIAGNOSIS — Z23 Encounter for immunization: Secondary | ICD-10-CM

## 2016-11-04 DIAGNOSIS — Z951 Presence of aortocoronary bypass graft: Secondary | ICD-10-CM | POA: Diagnosis not present

## 2016-11-04 DIAGNOSIS — I5022 Chronic systolic (congestive) heart failure: Secondary | ICD-10-CM

## 2016-11-04 DIAGNOSIS — Z91040 Latex allergy status: Secondary | ICD-10-CM

## 2016-11-04 DIAGNOSIS — I255 Ischemic cardiomyopathy: Secondary | ICD-10-CM | POA: Diagnosis present

## 2016-11-04 DIAGNOSIS — F329 Major depressive disorder, single episode, unspecified: Secondary | ICD-10-CM | POA: Diagnosis present

## 2016-11-04 DIAGNOSIS — I5089 Other heart failure: Secondary | ICD-10-CM | POA: Diagnosis not present

## 2016-11-04 DIAGNOSIS — F419 Anxiety disorder, unspecified: Secondary | ICD-10-CM | POA: Diagnosis present

## 2016-11-04 DIAGNOSIS — I482 Chronic atrial fibrillation: Secondary | ICD-10-CM | POA: Diagnosis not present

## 2016-11-04 DIAGNOSIS — J9811 Atelectasis: Secondary | ICD-10-CM | POA: Diagnosis not present

## 2016-11-04 DIAGNOSIS — Z7951 Long term (current) use of inhaled steroids: Secondary | ICD-10-CM | POA: Diagnosis not present

## 2016-11-04 DIAGNOSIS — I5031 Acute diastolic (congestive) heart failure: Secondary | ICD-10-CM | POA: Diagnosis not present

## 2016-11-04 DIAGNOSIS — Z7901 Long term (current) use of anticoagulants: Secondary | ICD-10-CM

## 2016-11-04 DIAGNOSIS — Z833 Family history of diabetes mellitus: Secondary | ICD-10-CM

## 2016-11-04 DIAGNOSIS — Z7984 Long term (current) use of oral hypoglycemic drugs: Secondary | ICD-10-CM

## 2016-11-04 DIAGNOSIS — Z90710 Acquired absence of both cervix and uterus: Secondary | ICD-10-CM | POA: Diagnosis not present

## 2016-11-04 DIAGNOSIS — Z87891 Personal history of nicotine dependence: Secondary | ICD-10-CM | POA: Diagnosis not present

## 2016-11-04 DIAGNOSIS — I4510 Unspecified right bundle-branch block: Secondary | ICD-10-CM | POA: Diagnosis present

## 2016-11-04 DIAGNOSIS — E785 Hyperlipidemia, unspecified: Secondary | ICD-10-CM | POA: Diagnosis not present

## 2016-11-04 DIAGNOSIS — I110 Hypertensive heart disease with heart failure: Secondary | ICD-10-CM | POA: Diagnosis present

## 2016-11-04 DIAGNOSIS — J9601 Acute respiratory failure with hypoxia: Secondary | ICD-10-CM | POA: Diagnosis not present

## 2016-11-04 DIAGNOSIS — Z91048 Other nonmedicinal substance allergy status: Secondary | ICD-10-CM

## 2016-11-04 DIAGNOSIS — R079 Chest pain, unspecified: Secondary | ICD-10-CM | POA: Diagnosis not present

## 2016-11-04 DIAGNOSIS — E118 Type 2 diabetes mellitus with unspecified complications: Secondary | ICD-10-CM | POA: Diagnosis not present

## 2016-11-04 DIAGNOSIS — Z79899 Other long term (current) drug therapy: Secondary | ICD-10-CM

## 2016-11-04 DIAGNOSIS — Z888 Allergy status to other drugs, medicaments and biological substances status: Secondary | ICD-10-CM

## 2016-11-04 DIAGNOSIS — Z7982 Long term (current) use of aspirin: Secondary | ICD-10-CM | POA: Diagnosis not present

## 2016-11-04 LAB — CBC WITH DIFFERENTIAL/PLATELET
Basophils Absolute: 0.1 10*3/uL (ref 0.0–0.1)
Basophils Relative: 1 %
Eosinophils Absolute: 0.1 10*3/uL (ref 0.0–0.7)
Eosinophils Relative: 1 %
HCT: 30.8 % — ABNORMAL LOW (ref 36.0–46.0)
HEMOGLOBIN: 9.9 g/dL — AB (ref 12.0–15.0)
LYMPHS ABS: 2 10*3/uL (ref 0.7–4.0)
LYMPHS PCT: 21 %
MCH: 29.6 pg (ref 26.0–34.0)
MCHC: 32.1 g/dL (ref 30.0–36.0)
MCV: 92.2 fL (ref 78.0–100.0)
Monocytes Absolute: 1 10*3/uL (ref 0.1–1.0)
Monocytes Relative: 10 %
NEUTROS PCT: 67 %
Neutro Abs: 6.3 10*3/uL (ref 1.7–7.7)
Platelets: 211 10*3/uL (ref 150–400)
RBC: 3.34 MIL/uL — AB (ref 3.87–5.11)
RDW: 16.9 % — ABNORMAL HIGH (ref 11.5–15.5)
WBC: 9.4 10*3/uL (ref 4.0–10.5)

## 2016-11-04 LAB — BASIC METABOLIC PANEL
Anion gap: 10 (ref 5–15)
BUN: 9 mg/dL (ref 6–20)
CHLORIDE: 101 mmol/L (ref 101–111)
CO2: 28 mmol/L (ref 22–32)
Calcium: 9.4 mg/dL (ref 8.9–10.3)
Creatinine, Ser: 0.67 mg/dL (ref 0.44–1.00)
GFR calc non Af Amer: >60 mL/min (ref >60)
Glucose, Bld: 102 mg/dL — ABNORMAL HIGH (ref 65–99)
POTASSIUM: 4.1 mmol/L (ref 3.5–5.1)
SODIUM: 139 mmol/L (ref 135–145)

## 2016-11-04 LAB — TROPONIN I

## 2016-11-04 LAB — BRAIN NATRIURETIC PEPTIDE: B Natriuretic Peptide: 496 pg/mL — ABNORMAL HIGH (ref 0.0–100.0)

## 2016-11-04 MED ORDER — ALPRAZOLAM 1 MG PO TABS
1.0000 mg | ORAL_TABLET | Freq: Four times a day (QID) | ORAL | Status: DC
  Administered 2016-11-04 – 2016-11-06 (×6): 1 mg via ORAL
  Filled 2016-11-04 (×6): qty 1

## 2016-11-04 MED ORDER — ATORVASTATIN CALCIUM 40 MG PO TABS
40.0000 mg | ORAL_TABLET | Freq: Every day | ORAL | Status: DC
  Administered 2016-11-04 – 2016-11-06 (×3): 40 mg via ORAL
  Filled 2016-11-04 (×3): qty 1

## 2016-11-04 MED ORDER — ACETAMINOPHEN 650 MG RE SUPP: 650.0000 mg | Freq: Four times a day (QID) | RECTAL | Status: DC | PRN

## 2016-11-04 MED ORDER — IPRATROPIUM-ALBUTEROL 0.5-2.5 (3) MG/3ML IN SOLN
3.0000 mL | RESPIRATORY_TRACT | Status: DC
Start: 2016-11-05 — End: 2016-11-05
  Administered 2016-11-04: 3 mL via RESPIRATORY_TRACT
  Filled 2016-11-04: qty 3

## 2016-11-04 MED ORDER — FLUTICASONE PROPIONATE 50 MCG/ACT NA SUSP: 2.0000 | Freq: Every day | NASAL | Status: DC | PRN

## 2016-11-04 MED ORDER — SODIUM CHLORIDE 0.9 % IV SOLN: 250.0000 mL | INTRAVENOUS | Status: DC | PRN

## 2016-11-04 MED ORDER — DIGOXIN 125 MCG PO TABS
125.0000 ug | ORAL_TABLET | Freq: Every day | ORAL | Status: DC
  Administered 2016-11-05 – 2016-11-06 (×2): 125 ug via ORAL
  Filled 2016-11-04 (×2): qty 1

## 2016-11-04 MED ORDER — LISINOPRIL 5 MG PO TABS
2.5000 mg | ORAL_TABLET | Freq: Every day | ORAL | Status: DC
  Administered 2016-11-05 – 2016-11-06 (×2): 2.5 mg via ORAL
  Filled 2016-11-04 (×2): qty 1

## 2016-11-04 MED ORDER — SODIUM CHLORIDE 0.9% FLUSH
3.0000 mL | Freq: Two times a day (BID) | INTRAVENOUS | Status: DC
  Administered 2016-11-05: 3 mL via INTRAVENOUS

## 2016-11-04 MED ORDER — METOPROLOL TARTRATE 25 MG PO TABS
75.0000 mg | ORAL_TABLET | Freq: Every day | ORAL | Status: DC
Start: 2016-11-04 — End: 2016-11-06
  Administered 2016-11-04 – 2016-11-05 (×2): 75 mg via ORAL
  Filled 2016-11-04: qty 1

## 2016-11-04 MED ORDER — AZITHROMYCIN 250 MG PO TABS
500.0000 mg | ORAL_TABLET | Freq: Every day | ORAL | Status: DC
  Administered 2016-11-05 – 2016-11-06 (×2): 500 mg via ORAL
  Filled 2016-11-04 (×2): qty 2

## 2016-11-04 MED ORDER — METHYLPREDNISOLONE SODIUM SUCC 125 MG IJ SOLR
125.0000 mg | Freq: Once | INTRAMUSCULAR | Status: AC
  Administered 2016-11-04: 125 mg via INTRAVENOUS
  Filled 2016-11-04: qty 2

## 2016-11-04 MED ORDER — ASPIRIN EC 81 MG PO TBEC
81.0000 mg | DELAYED_RELEASE_TABLET | Freq: Every day | ORAL | Status: DC
  Administered 2016-11-05 – 2016-11-06 (×2): 81 mg via ORAL
  Filled 2016-11-04 (×2): qty 1

## 2016-11-04 MED ORDER — ACETAMINOPHEN 325 MG PO TABS
650.0000 mg | ORAL_TABLET | Freq: Four times a day (QID) | ORAL | Status: DC | PRN
  Administered 2016-11-05 (×2): 650 mg via ORAL
  Filled 2016-11-04 (×2): qty 2

## 2016-11-04 MED ORDER — ALBUTEROL SULFATE (2.5 MG/3ML) 0.083% IN NEBU
5.0000 mg | INHALATION_SOLUTION | Freq: Once | RESPIRATORY_TRACT | Status: AC
  Administered 2016-11-04: 5 mg via RESPIRATORY_TRACT
  Filled 2016-11-04: qty 6

## 2016-11-04 MED ORDER — DABIGATRAN ETEXILATE MESYLATE 150 MG PO CAPS
150.0000 mg | ORAL_CAPSULE | Freq: Two times a day (BID) | ORAL | Status: DC
  Administered 2016-11-05 – 2016-11-06 (×3): 150 mg via ORAL
  Filled 2016-11-04 (×10): qty 1

## 2016-11-04 MED ORDER — INSULIN ASPART 100 UNIT/ML ~~LOC~~ SOLN
0.0000 [IU] | Freq: Three times a day (TID) | SUBCUTANEOUS | Status: DC
  Administered 2016-11-05: 5 [IU] via SUBCUTANEOUS
  Administered 2016-11-05: 2 [IU] via SUBCUTANEOUS
  Administered 2016-11-05 – 2016-11-06 (×2): 3 [IU] via SUBCUTANEOUS

## 2016-11-04 MED ORDER — METOPROLOL TARTRATE 50 MG PO TABS
100.0000 mg | ORAL_TABLET | Freq: Every morning | ORAL | Status: DC
  Administered 2016-11-05 – 2016-11-06 (×2): 100 mg via ORAL
  Filled 2016-11-04 (×3): qty 2

## 2016-11-04 MED ORDER — SALINE SPRAY 0.65 % NA SOLN: 1.0000 | NASAL | Status: DC | PRN

## 2016-11-04 MED ORDER — IPRATROPIUM BROMIDE 0.02 % IN SOLN
0.5000 mg | Freq: Once | RESPIRATORY_TRACT | Status: AC
  Administered 2016-11-04: 0.5 mg via RESPIRATORY_TRACT
  Filled 2016-11-04: qty 2.5

## 2016-11-04 MED ORDER — MOMETASONE FURO-FORMOTEROL FUM 200-5 MCG/ACT IN AERO
2.0000 | INHALATION_SPRAY | Freq: Two times a day (BID) | RESPIRATORY_TRACT | Status: DC
  Administered 2016-11-05 – 2016-11-06 (×2): 2 via RESPIRATORY_TRACT
  Filled 2016-11-04: qty 8.8

## 2016-11-04 MED ORDER — ADULT MULTIVITAMIN W/MINERALS CH
1.0000 | ORAL_TABLET | Freq: Every day | ORAL | Status: DC
  Administered 2016-11-05 – 2016-11-06 (×2): 1 via ORAL
  Filled 2016-11-04 (×2): qty 1

## 2016-11-04 MED ORDER — IPRATROPIUM-ALBUTEROL 0.5-2.5 (3) MG/3ML IN SOLN: 3.0000 mL | RESPIRATORY_TRACT | Status: DC | PRN

## 2016-11-04 MED ORDER — ONDANSETRON HCL 4 MG/2ML IJ SOLN: 4.0000 mg | Freq: Four times a day (QID) | INTRAMUSCULAR | Status: DC | PRN

## 2016-11-04 MED ORDER — SODIUM CHLORIDE 0.9% FLUSH: 3.0000 mL | INTRAVENOUS | Status: DC | PRN

## 2016-11-04 MED ORDER — MAGNESIUM OXIDE 400 (241.3 MG) MG PO TABS
400.0000 mg | ORAL_TABLET | Freq: Every day | ORAL | Status: DC
  Administered 2016-11-05 – 2016-11-06 (×2): 400 mg via ORAL
  Filled 2016-11-04 (×5): qty 1

## 2016-11-04 MED ORDER — FUROSEMIDE 20 MG PO TABS
10.0000 mg | ORAL_TABLET | Freq: Every day | ORAL | Status: DC | PRN
Start: 2016-11-04 — End: 2016-11-05

## 2016-11-04 MED ORDER — METHYLPREDNISOLONE SODIUM SUCC 40 MG IJ SOLR
40.0000 mg | Freq: Three times a day (TID) | INTRAMUSCULAR | Status: DC
  Administered 2016-11-05 – 2016-11-06 (×4): 40 mg via INTRAVENOUS
  Filled 2016-11-04 (×4): qty 1

## 2016-11-04 MED ORDER — ONDANSETRON HCL 4 MG PO TABS: 4.0000 mg | ORAL_TABLET | Freq: Four times a day (QID) | ORAL | Status: DC | PRN

## 2016-11-04 MED ORDER — INFLUENZA VAC SPLIT QUAD 0.5 ML IM SUSY
0.5000 mL | PREFILLED_SYRINGE | INTRAMUSCULAR | Status: AC
  Administered 2016-11-05: 0.5 mL via INTRAMUSCULAR
  Filled 2016-11-04: qty 0.5

## 2016-11-04 NOTE — ED Provider Notes (Signed)
[facility] DEPT Provider Note   CSN: [id number] Arrival date & time: 11/04/16  1501     History   Chief Complaint Chief Complaint  Patient presents with  . Shortness of Breath    HPI Mary [name] is a 66 y.o. female.  HPI  patient has a history of COPD. She was seen in the ED on Thanksgiving and treated with a Z-pack.She has not gotten any better and still has a cough. She states she's bringing up sputum. No fevers. States she feels bad and cannot do many of the activities she would do otherwise. She talked her primary care doctor is given a cough medicine which has not helped. States she's had this in the past. She has some swelling or legs. She has some dull chest pain. History of COPD and history of CHF. States she took some extra Lasix because her weight had gone up. Past Medical History:  Diagnosis Date  . Anxiety   . Arthritis   . Atrial fibrillation ([place])   . Bronchial asthma   . CHF (congestive heart failure) ([place])   . Chronic pain    Bacl pain, Disc L5-S1- Dr. Joya Salm in [place]  . COPD (chronic obstructive pulmonary disease) ([place])   . DDD (degenerative disc disease)    Radicular symptoms  . Diabetes mellitus   . History of stress test 06/2011   Abnormal myocardial perfusion study.  Marland Kitchen Hx of echocardiogram    Was interpreted by Dr Doylene Canard that showed an Ef in the 50-60% range with grade 1 diastolic dysfunction. she had moderate MR, biatrial enlargement, moderate TR and at that time estimated RV systolic pressure was 43 mm.  . Hyperlipidemia   . Hypertension   . Neuropathy ([place])   . Shortness of breath   . Tachycardia     Patient Active Problem List   Diagnosis Date Noted  . Left carotid bruit 09/13/2015  . Noncompliance with medications 03/19/2015  . Gastroenteritis 03/19/2015  . Seasonal allergies 01/25/2015  . Chest pain 05/16/2014  . HTN (hypertension) 05/16/2014  . PSVT (paroxysmal supraventricular tachycardia) ([place]) 05/16/2014  . Acute diastolic CHF  (congestive heart failure) ([place]) 05/15/2014  . CAD (coronary artery disease) 05/15/2014  . RBBB 04/25/2014  . Benign skin lesion 03/07/2014  . 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 Atrial fibrillation with rapid ventricular response ([place]) 01/06/2014  . Leukocytosis 01/06/2014  . Acute diastolic heart failure ([place]) 01/06/2014  . Fall at home 09/18/2013  . Recurrent falls 09/18/2013  . Tinnitus of left ear 02/19/2013  . Diastolic congestive heart failure ([place]) 09/10/2012  . Atrial fibrillation ([place]) 09/10/2012  . DDD (degenerative disc disease), lumbar 08/26/2012  . Atypical mole 04/14/2012  . Seborrheic keratosis 04/14/2012  . Depression 12/09/2011  . COPD (chronic obstructive pulmonary disease) ([place]) 06/27/2011  . Anxiety 06/27/2011  . Chronic pain 05/27/2011  . Diabetes mellitus ([place]) 05/26/2011  . Hyperlipidemia 05/26/2011    Past Surgical History:  Procedure Laterality Date  . ABDOMINAL HYSTERECTOMY     partial  . Breast cyst removed    . CARPAL TUNNEL RELEASE     x 2  . CORONARY ARTERY BYPASS GRAFT N/A 01/09/2014   Procedure: CORONARY ARTERY BYPASS GRAFTING (CABG) x 3 using endoscopically harvested right saphenous vein and left internal mammary artery and closure of left atrial appendage;  Surgeon: Ivin Poot, MD;  Location: [facility];  Service: Open Heart Surgery;  Laterality: N/A;  patient has preop IA BP   . INTRAOPERATIVE TRANSESOPHAGEAL  ECHOCARDIOGRAM N/A 01/09/2014   Procedure: INTRAOPERATIVE TRANSESOPHAGEAL ECHOCARDIOGRAM;  Surgeon: Ivin Poot, MD;  Location: [facility];  Service: Open Heart Surgery;  Laterality: N/A;  . LEFT HEART CATHETERIZATION WITH CORONARY ANGIOGRAM N/A 01/07/2014   Procedure: LEFT HEART CATHETERIZATION WITH CORONARY ANGIOGRAM;  Surgeon: Lorretta Harp, MD;  Location: [facility] C[facility] LAB;  Service: Cardiovascular;  Laterality: N/A;  . TONSILLECTOMY    . TUBAL LIGATION      OB History    Gravida Para Term Preterm AB Living   1 1 1      0   SAB TAB Ectopic  Multiple Live Births                   Home Medications    Prior to Admission medications   Medication Sig Start Date End Date Taking? Authorizing Provider  acetaminophen (TYLENOL) 500 MG tablet Take 500 mg by mouth every 6 (six) hours as needed for mild pain.   Yes Historical Provider, MD  ALPRAZolam Duanne Moron) 1 MG tablet Take 1 mg by mouth 4 (four) times daily.  03/10/16  Yes Historical Provider, MD  aspirin EC 81 MG tablet Take 81 mg by mouth daily.   Yes Historical Provider, MD  atorvastatin (LIPITOR) 40 MG tablet Take 1 tablet (40 mg total) by mouth daily. KEEP OV. 09/21/16  Yes Troy Sine, MD  [place] 125 MCG tablet TAKE ONE TABLET BY MOUTH ONCE DAILY. 08/19/16  Yes Troy Sine, MD  fluticasone (FLONASE) 50 MCG/ACT nasal spray Place 2 sprays into both nostrils daily. Patient taking differently: Place 2 sprays into both nostrils daily as needed for allergies.  01/25/15  Yes Alycia Rossetti, MD  furosemide (LASIX) 20 MG tablet Take 1 tablet (20 mg total) by mouth daily. Patient taking differently: Take 10 mg by mouth daily as needed for fluid.  09/17/16  Yes Milton Ferguson, MD  glipiZIDE (GLUCOTROL XL) 2.5 MG 24 hr tablet 2.5 mg daily.  02/28/16  Yes Historical Provider, MD  JANUVIA 100 MG tablet TAKE ONE TABLET BY MOUTH DAILY. 07/19/15  Yes Alycia Rossetti, MD  lisinopril (PRINIVIL,ZESTRIL) 2.5 MG tablet Take 1 tablet (2.5 mg total) by mouth daily. 04/15/16  Yes Troy Sine, MD  magnesium oxide (MAG-OX) 400 MG tablet Take 400 mg by mouth daily.   Yes Historical Provider, MD  metFORMIN (GLUCOPHAGE) 850 MG tablet TAKE 1 TABLET BY MOUTH TWICE DAILY WITH MEALS. 07/19/15  Yes Alycia Rossetti, MD  metoprolol (LOPRESSOR) 50 MG tablet Take 2 tablets in the morning and 1 1/2 tablets at night 07/22/16  Yes Troy Sine, MD  Multiple Vitamin (MULTIVITAMIN) capsule Take 1 capsule by mouth daily.   Yes Historical Provider, MD  ondansetron (ZOFRAN ODT) 4 MG disintegrating tablet 4mg  ODT q4 hours  prn nausea/vomit 09/17/16  Yes Milton Ferguson, MD  potassium chloride SA (K-DUR,KLOR-CON) 20 MEQ tablet Take 10 mEq by mouth daily as needed (along with furosemide).  06/18/16  Yes Historical Provider, MD  PRADAXA 150 MG CAPS capsule TAKE ONE CAPSULE BY MOUTH TWICE DAILY. 04/24/16  Yes Troy Sine, MD  sodium chloride (OCEAN) 0.65 % nasal spray Place 1 spray into the nose as needed for congestion. Congestion   Yes Historical Provider, MD  SYMBICORT 160-4.5 MCG/ACT inhaler INHALE 2 PUFFS INTO THE LUNGS TWICE DAILY. RINSE MOUTH AFTER USE. 06/04/15  Yes Alycia Rossetti, MD  ACCU-CHEK AVIVA PLUS test strip USE TO TEST 3 TO 4 TIMES DAILY OR AS DIRECTED.  07/20/16   Alycia Rossetti, MD  albuterol (PROAIR HFA) 108 (90 BASE) MCG/ACT inhaler Inhale 2 puffs into the lungs every 6 (six) hours as needed for wheezing or shortness of breath. 08/14/14   Alycia Rossetti, MD  azithromycin (ZITHROMAX Z-PAK) 250 MG tablet 2 po day one, then 1 daily x 4 days Patient not taking: Reported on 11/04/2016 09/17/16   Milton Ferguson, MD    Family History Family History  Problem Relation Age of Onset  . Diabetes Mother   . Heart disease Mother   . Diabetes Sister   . Hypertension Sister   . Hyperlipidemia Sister   . Diabetes Brother   . Heart disease Brother   . Diabetes Brother   . Heart disease Brother     Social History Social History  Substance Use Topics  . Smoking status: Former Smoker    Packs/day: 1.50    Years: 30.00    Types: Cigarettes    Quit date: 05/26/2013  . Smokeless tobacco: Never Used     Comment: quit 6 months ago  . Alcohol use No     Allergies   Adhesive [tape]; Latex; and Zocor [simvastatin - high dose]   Review of Systems Review of Systems  Constitutional: Negative for appetite change and fever.  HENT: Positive for congestion.   Eyes: Negative for visual disturbance.  Respiratory: Positive for shortness of breath.   Cardiovascular: Positive for chest pain and leg swelling.    Gastrointestinal: Negative for abdominal pain.  Genitourinary: Negative for dyspareunia.  Musculoskeletal: Negative for back pain.  Neurological: Negative for headaches.  Hematological: Negative for adenopathy.  Psychiatric/Behavioral: Negative for confusion.     Physical Exam Updated Vital Signs BP 117/61 (BP Location: Left Arm)   Pulse 82   Temp 98 F (36.7 C) (Oral)   Resp 18   Ht 5\' 7"  (1.702 m)   Wt 123 lb (55.8 kg)   SpO2 96%   BMI 19.26 kg/m   Physical Exam  Constitutional: She appears well-developed and well-nourished.  HENT:  Head: Atraumatic.  Eyes: Pupils are equal, round, and reactive to light.  Neck: No JVD present.  Cardiovascular: Normal rate.   Pulmonary/Chest:  Some dyspnea but is able to complete sentences. Diffuse harsh breath sounds with wheezes.  Abdominal: Soft. There is no tenderness.  Musculoskeletal: She exhibits edema.  Mild edema to bilateral lower extremities.  Neurological: She is alert.  Skin: Skin is warm.  Psychiatric: She has a normal mood and affect.     ED Treatments / Results  Labs (all labs ordered are listed, but only abnormal results are displayed) Labs Reviewed  BRAIN NATRIURETIC PEPTIDE - Abnormal; Notable for the following:       Result Value   B Natriuretic Peptide 496.0 (*)    All other components within normal limits  BASIC METABOLIC PANEL - Abnormal; Notable for the following:    Glucose, Bld 102 (*)    All other components within normal limits  CBC WITH DIFFERENTIAL/PLATELET - Abnormal; Notable for the following:    RBC 3.34 (*)    Hemoglobin 9.9 (*)    HCT 30.8 (*)    RDW 16.9 (*)    All other components within normal limits  TROPONIN I    EKG  EKG Interpretation  Date/Time:  Wednesday November 04 2016 15:39:11 EST Ventricular Rate:  86 PR Interval:    QRS Duration: 122 QT Interval:  376 QTC Calculation: 449 R Axis:   4 Text Interpretation:  Atrial fibrillation with a competing junctional pacemaker  Right bundle branch block Abnormal ECG Confirmed by Alvino Chapel  MD, Samanvi Cuccia [phone]) on 11/04/2016 3:47:17 PM       Radiology Dg Chest 2 View  Result Date: 11/04/2016 CLINICAL DATA:  Cough and chest pain EXAM: CHEST  2 VIEW COMPARISON:  September 17, 2016 FINDINGS: There is right middle lobe atelectatic change. No edema or consolidation. Heart size and pulmonary vascularity are normal. No adenopathy. There is atherosclerotic calcification in the aorta. Patient is status post coronary artery bypass grafting. There is a left atrial appendage clamp. There is calcification in the right carotid artery. No bone lesions evident. IMPRESSION: Right middle lobe atelectasis. No edema or consolidation. Heart size within normal limits. There is aortic atherosclerosis and calcification in the right carotid artery. Electronically Signed   By: Lowella Grip III M.D.   On: 11/04/2016 15:57    Procedures Procedures (including critical care time)  Medications Ordered in ED Medications  methylPREDNISolone sodium succinate (SOLU-MEDROL) 125 mg/2 mL injection 125 mg (not administered)  albuterol (PROVENTIL) (2.5 MG/3ML) 0.083% nebulizer solution 5 mg (not administered)  albuterol (PROVENTIL) (2.5 MG/3ML) 0.083% nebulizer solution 5 mg (5 mg Nebulization Given 11/04/16 1644)  ipratropium (ATROVENT) nebulizer solution 0.5 mg (0.5 mg Nebulization Given 11/04/16 1644)     Initial Impression / Assessment and Plan / ED Course  I have reviewed the triage vital signs and the nursing notes.  Pertinent labs & imaging results that were available during my care of the patient were reviewed by me and considered in my medical decision making (see chart for details).  Clinical Course     Patient with cough and shortness of breath. Initially able ambulate without hypoxia but then did have oxygen saturations down to the 80s. Breathing treatment helped with her symptoms still is tight. Her weight is up and her BNP is elevated  but x-ray does not show CHF. With the episode of hypoxia will admit to internal medicine.  Final Clinical Impressions(s) / ED Diagnoses   Final diagnoses:  COPD exacerbation [facility])    New Prescriptions New Prescriptions   No medications on file     Davonna Belling, MD 11/04/16 2042

## 2016-11-04 NOTE — ED Notes (Signed)
EKG given to Dr. Roderic Palau to review.

## 2016-11-04 NOTE — H&P (Signed)
History and Physical    Mary [name]:[id number] DOB: 1951-05-27 DOA: 11/04/2016  PCP: Wende Neighbors, MD   Patient coming from: Home  Chief Complaint: Dyspnea  HPI: Mary [name] is a 66 y.o. female with medical history significant of COPD who presents to the hospital with worsening shortness of breath. Her symptoms had an acute worsening over last 12 hours, this morning patient woke up with severe dyspnea, no improving, worse with exertion, associated with congestion, increase mucus production, no associated fevers but generalized weakness. Her dyspnea has been worsening to the point where she is dyspneic with minimal efforts. She has not been at her baseline since late November last year when she was diagnosed with COPD exacerbation, back then  she received antibiotics and steroids with mild improvement of her symptoms. Lately she has been using a cane for ambulation due to generalized weakness and dyspnea on exertion.   ED Course: Patient was evaluated not to be and mild respiratory distress, oxygen saturation down to 82%, requested admission for further evaluation and management.   Review of Systems:  1. General. No weight gain, no weight loss, no fevers or chills 2. ENT no runny nose or sore throat 3. Pulmonary positive for dyspnea, cough, increased mucus production as mentioned in history of present illness 4. Cardiovascular no angina, claudication, PND orthopnea. 5. Gastrointestinal no nausea vomiting or diarrhea 6. Dermatology no rashes 7. Hematology no easy bruisability of her infections 8. Endocrine a tremors heat or cold intolerance 9. Urology no dysuria or increased urinary frequency 10. Neurology no seizures or paresthesias.   Past Medical History:  Diagnosis Date  . Anxiety   . Arthritis   . Atrial fibrillation ([place])   . Bronchial asthma   . CHF (congestive heart failure) ([place])   . Chronic pain    Bacl pain, Disc L5-S1- Dr. Joya Salm in [place]  . COPD (chronic obstructive  pulmonary disease) ([place])   . DDD (degenerative disc disease)    Radicular symptoms  . Diabetes mellitus   . History of stress test 06/2011   Abnormal myocardial perfusion study.  Marland Kitchen Hx of echocardiogram    Was interpreted by Dr Doylene Canard that showed an Ef in the 50-60% range with grade 1 diastolic dysfunction. she had moderate MR, biatrial enlargement, moderate TR and at that time estimated RV systolic pressure was 43 mm.  . Hyperlipidemia   . Hypertension   . Neuropathy ([place])   . Shortness of breath   . Tachycardia     Past Surgical History:  Procedure Laterality Date  . ABDOMINAL HYSTERECTOMY     partial  . Breast cyst removed    . CARPAL TUNNEL RELEASE     x 2  . CORONARY ARTERY BYPASS GRAFT N/A 01/09/2014   Procedure: CORONARY ARTERY BYPASS GRAFTING (CABG) x 3 using endoscopically harvested right saphenous vein and left internal mammary artery and closure of left atrial appendage;  Surgeon: Ivin Poot, MD;  Location: [facility];  Service: Open Heart Surgery;  Laterality: N/A;  patient has preop IA BP   . INTRAOPERATIVE TRANSESOPHAGEAL ECHOCARDIOGRAM N/A 01/09/2014   Procedure: INTRAOPERATIVE TRANSESOPHAGEAL ECHOCARDIOGRAM;  Surgeon: Ivin Poot, MD;  Location: [facility];  Service: Open Heart Surgery;  Laterality: N/A;  . LEFT HEART CATHETERIZATION WITH CORONARY ANGIOGRAM N/A 01/07/2014   Procedure: LEFT HEART CATHETERIZATION WITH CORONARY ANGIOGRAM;  Surgeon: Lorretta Harp, MD;  Location: [facility] CATH LAB;  Service: Cardiovascular;  Laterality: N/A;  . TONSILLECTOMY    . TUBAL  LIGATION       reports that she quit smoking about 3 years ago. Her smoking use included Cigarettes. She has a 45.00 pack-year smoking history. She has never used smokeless tobacco. She reports that she does not drink alcohol or use drugs.  Allergies  Allergen Reactions  . Adhesive [Tape] Other (See Comments)    Tears skin off.   . Latex Other (See Comments)    In tape, tears skin off.  . Zocor [Simvastatin  - High Dose] Nausea And Vomiting    Family History  Problem Relation Age of Onset  . Diabetes Mother   . Heart disease Mother   . Diabetes Sister   . Hypertension Sister   . Hyperlipidemia Sister   . Diabetes Brother   . Heart disease Brother   . Diabetes Brother   . Heart disease Brother    Unacceptable: Noncontributory, unremarkable, or negative. Acceptable: Family history reviewed and not pertinent (If you reviewed it)  Prior to Admission medications   Medication Sig Start Date End Date Taking? Authorizing Provider  acetaminophen (TYLENOL) 500 MG tablet Take 500 mg by mouth every 6 (six) hours as needed for mild pain.   Yes Historical Provider, MD  ALPRAZolam Duanne Moron) 1 MG tablet Take 1 mg by mouth 4 (four) times daily.  03/10/16  Yes Historical Provider, MD  aspirin EC 81 MG tablet Take 81 mg by mouth daily.   Yes Historical Provider, MD  atorvastatin (LIPITOR) 40 MG tablet Take 1 tablet (40 mg total) by mouth daily. KEEP OV. 09/21/16  Yes [place] Sine, MD  [place] 125 MCG tablet TAKE ONE TABLET BY MOUTH ONCE DAILY. 08/19/16  Yes [place] Sine, MD  fluticasone (FLONASE) 50 MCG/ACT nasal spray Place 2 sprays into both nostrils daily. Patient taking differently: Place 2 sprays into both nostrils daily as needed for allergies.  01/25/15  Yes Alycia Rossetti, MD  furosemide (LASIX) 20 MG tablet Take 1 tablet (20 mg total) by mouth daily. Patient taking differently: Take 10 mg by mouth daily as needed for fluid.  09/17/16  Yes Milton Ferguson, MD  glipiZIDE (GLUCOTROL XL) 2.5 MG 24 hr tablet 2.5 mg daily.  02/28/16  Yes Historical Provider, MD  JANUVIA 100 MG tablet TAKE ONE TABLET BY MOUTH DAILY. 07/19/15  Yes Alycia Rossetti, MD  lisinopril (PRINIVIL,ZESTRIL) 2.5 MG tablet Take 1 tablet (2.5 mg total) by mouth daily. 04/15/16  Yes [place] Sine, MD  magnesium oxide (MAG-OX) 400 MG tablet Take 400 mg by mouth daily.   Yes Historical Provider, MD  metFORMIN (GLUCOPHAGE) 850 MG tablet TAKE 1  TABLET BY MOUTH TWICE DAILY WITH MEALS. 07/19/15  Yes Alycia Rossetti, MD  metoprolol (LOPRESSOR) 50 MG tablet Take 2 tablets in the morning and 1 1/2 tablets at night 07/22/16  Yes [place] Sine, MD  Multiple Vitamin (MULTIVITAMIN) capsule Take 1 capsule by mouth daily.   Yes Historical Provider, MD  ondansetron (ZOFRAN ODT) 4 MG disintegrating tablet 4mg  ODT q4 hours prn nausea/vomit 09/17/16  Yes Milton Ferguson, MD  potassium chloride SA (K-DUR,KLOR-CON) 20 MEQ tablet Take 10 mEq by mouth daily as needed (along with furosemide).  06/18/16  Yes Historical Provider, MD  PRADAXA 150 MG CAPS capsule TAKE ONE CAPSULE BY MOUTH TWICE DAILY. 04/24/16  Yes [place] Sine, MD  sodium chloride (OCEAN) 0.65 % nasal spray Place 1 spray into the nose as needed for congestion. Congestion   Yes Historical Provider, MD  SYMBICORT 160-4.5 MCG/ACT  inhaler INHALE 2 PUFFS INTO THE LUNGS TWICE DAILY. RINSE MOUTH AFTER USE. 06/04/15  Yes Alycia Rossetti, MD  ACCU-CHEK AVIVA PLUS test strip USE TO TEST 3 TO 4 TIMES DAILY OR AS DIRECTED. 07/20/16   Alycia Rossetti, MD  albuterol (PROAIR HFA) 108 (90 BASE) MCG/ACT inhaler Inhale 2 puffs into the lungs every 6 (six) hours as needed for wheezing or shortness of breath. 08/14/14   Alycia Rossetti, MD  azithromycin (ZITHROMAX Z-PAK) 250 MG tablet 2 po day one, then 1 daily x 4 days Patient not taking: Reported on 11/04/2016 09/17/16   Milton Ferguson, MD    Physical Exam: Vitals:   11/04/16 1645 11/04/16 1800 11/04/16 1815 11/04/16 1831  BP:    117/61  Pulse:  92 91 82  Resp:  22 22 18   Temp:      TempSrc:      SpO2: 95% (!) 89% 92% 96%  Weight:      Height:          Constitutional: deconditioned and ill looking appearing.  Vitals:   11/04/16 1645 11/04/16 1800 11/04/16 1815 11/04/16 1831  BP:    117/61  Pulse:  92 91 82  Resp:  22 22 18   Temp:      TempSrc:      SpO2: 95% (!) 89% 92% 96%  Weight:      Height:       Eyes: PERRL, lids and conjunctivae mild  pale. Head normocephalic, nose and ears no deformities ENMT: Mucous membranes are dry. Posterior pharynx clear of any exudate or lesions.Normal dentition.  Neck: normal, supple, no masses, no thyromegaly Respiratory: Decreased air movement bilaterally, poor ventilation, positive expiratory wheezing bilaterally and diffuse, no significant rhonchi, but scattered rails. Mild accessory muscle use.  Cardiovascular: Regular rate and rhythm, no murmurs / rubs / gallops. No extremity edema. 2+ pedal pulses. No carotid bruits.  Abdomen: no tenderness, no masses palpated. No hepatosplenomegaly. Bowel sounds positive.  Musculoskeletal: no clubbing / cyanosis. No joint deformity upper and lower extremities. Good ROM, no contractures. Normal muscle tone.  Skin: no rashes, lesions, ulcers. No induration Neurologic: CN 2-12 grossly intact. Sensation intact, DTR normal. Strength 5/5 in all 4.    Labs on Admission: I have personally reviewed following labs and imaging studies  CBC:  Recent Labs Lab 11/04/16 1712  WBC 9.4  NEUTROABS 6.3  HGB 9.9*  HCT 30.8*  MCV 92.2  PLT 387   Basic Metabolic Panel:  Recent Labs Lab 11/04/16 1712  NA 139  K 4.1  CL 101  CO2 28  GLUCOSE 102*  BUN 9  CREATININE 0.67  CALCIUM 9.4   GFR: Estimated Creatinine Clearance: 60.1 mL/min (by C-G formula based on SCr of 0.67 mg/dL). Liver Function Tests: No results for input(s): AST, ALT, ALKPHOS, BILITOT, PROT, ALBUMIN in the last 168 hours. No results for input(s): LIPASE, AMYLASE in the last 168 hours. No results for input(s): AMMONIA in the last 168 hours. Coagulation Profile: No results for input(s): INR, PROTIME in the last 168 hours. Cardiac Enzymes:  Recent Labs Lab 11/04/16 1712  TROPONINI <0.03   BNP (last 3 results) No results for input(s): PROBNP in the last 8760 hours. HbA1C: No results for input(s): HGBA1C in the last 72 hours. CBG: No results for input(s): GLUCAP in the last 168  hours. Lipid Profile: No results for input(s): CHOL, HDL, LDLCALC, TRIG, CHOLHDL, LDLDIRECT in the last 72 hours. Thyroid Function Tests: No results for input(s):  TSH, T4TOTAL, FREET4, T3FREE, THYROIDAB in the last 72 hours. Anemia Panel: No results for input(s): VITAMINB12, FOLATE, FERRITIN, TIBC, IRON, RETICCTPCT in the last 72 hours. Urine analysis:    Component Value Date/Time   COLORURINE YELLOW 08/14/2016 [place] 08/14/2016 1343   LABSPEC 1.010 08/14/2016 1343   PHURINE 7.0 08/14/2016 1343   GLUCOSEU NEGATIVE 08/14/2016 1343   HGBUR NEGATIVE 08/14/2016 1343   BILIRUBINUR NEGATIVE 08/14/2016 1343   KETONESUR NEGATIVE 08/14/2016 1343   PROTEINUR NEGATIVE 08/14/2016 1343   UROBILINOGEN 0.2 02/17/2015 2040   NITRITE NEGATIVE 08/14/2016 1343   LEUKOCYTESUR NEGATIVE 08/14/2016 1343   Sepsis Labs: !!!!!!!!!!!!!!!!!!!!!!!!!!!!!!!!!!!!!!!!!!!! @LABRCNTIP (procalcitonin:4,lacticidven:4) )No results found for this or any previous visit (from the past 240 hour(s)).   Radiological Exams on Admission: Dg Chest 2 View  Result Date: 11/04/2016 CLINICAL DATA:  Cough and chest pain EXAM: CHEST  2 VIEW COMPARISON:  September 17, 2016 FINDINGS: There is right middle lobe atelectatic change. No edema or consolidation. Heart size and pulmonary vascularity are normal. No adenopathy. There is atherosclerotic calcification in the aorta. Patient is status post coronary artery bypass grafting. There is a left atrial appendage clamp. There is calcification in the right carotid artery. No bone lesions evident. IMPRESSION: Right middle lobe atelectasis. No edema or consolidation. Heart size within normal limits. There is aortic atherosclerosis and calcification in the right carotid artery. Electronically Signed   By: Lowella Grip III M.D.   On: 11/04/2016 15:57    EKG: Independently reviewed. Atrial fibrillation rhythm with a right bundle branch block.  Assessment/Plan Active  Problems:   COPD (chronic obstructive pulmonary disease) ([place])   This is a 65 year old female who presents to the hospital with worsening dyspnea. Her symptoms have been acutely worsened over last 12 hours. They have been consistent with dyspnea, congestion, increased mucus production and wheezing. On initial physical examination her blood pressure 117/61, heart rate 82, oxygen saturation 96% on supplemental oxygen, respiratory rate 18. Documented desaturation down to 82% on room air. Her oral mucosa is dry, she has significant abnormal lung examination with wheezing, decreased air movement, expiratory wheezing and scattered rails. Sodium 139, potassium 4.1, chloride 101, bicarbonate 28, BUN 9, creatinine 0.67, BNP 496, white count 9.4, hb 9.9, hematocrit 30.8, platelets 211. Urine analysis negative for infection. Chest x-ray personally review showing rotation to the right side, significant hyperinflation, elevated right hemidiaphragm with a right midlung atelectasis.  The patient will be admitted to the hospital with the working diagnosis of acute hypoxic respiratory failure due to COPD exacerbation.  1. Acute hypoxic respiratory failure due to COPD exacerbation. The patient will be admitted to the medical unit on a cardiac monitor, will start patient on systemic steroids methylprednisolone 40 g every 8 hours, she will receive aggressive bronchodilator therapy with DuoNeb's every 4 hours and as needed every 2 hours. Antibiotic therapy with macrolide, azithromycin 500 g daily. Oximetry monitoring and supplemental option per nasal cannula to target O2 saturation more than 92%. Will continue long-acting beta agonist with formoterol and inhaled corticosteroid with mometasone.   2. Systolic heart failure chronic and stable, ischemic cardiomyopathy. Ejection fraction 30-35% in 2015. Patient clinically euvolemic, will continue to target negative fluid balance with by mouth furosemide per her home regimen.  Continue beta-blockade with metoprolol and Ace inhibitor with lisinopril. Continue aspirin and atorvastatin.  3. Chronic atrial fibrillation. Will continue rate control with AV blockade and digoxin. Anticoagulation with dabigatran.  4. Type 2 diabetes mellitus. Continue glucose control and monitor  with insulin sliding scale, will hold on oral hypoglycemic agents for now.   5. Anxiety. Continue alprazolam     DVT prophylaxis: enoxaparin Code Status: Full  Family Communication: No family at the bedside  Disposition Plan:  Home  Consults called: NA Admission status: Inpatient   Victory Strollo Gerome Apley MD [facility] Pager [phone]  If 7PM-7AM, please contact night-coverage [email or web address] Password [email or web address]  11/04/2016, 8:41 PM

## 2016-11-04 NOTE — ED Notes (Signed)
Pt oxygen saturation dropped to 82%.  Placed on 2 LPM and oxygen saturation did not change, changed to 4 LPM and oxygen saturation currently at 94%

## 2016-11-04 NOTE — ED Notes (Signed)
Ambulating pt around the nurse station o2 96 heart rate 90

## 2016-11-04 NOTE — ED Triage Notes (Signed)
Pt says she started with cough 2 days before Thanksgiving.  Hospitalized for one day and released and treated with z-pack.  Prescription called in last week for cough by PCP.  Pt says cough medication did not help.  C/o weakness.  C/o left lower abdominal pain, rates pain 0/10 but times it increases to 10/10.

## 2016-11-05 ENCOUNTER — Encounter (HOSPITAL_COMMUNITY): Payer: Self-pay | Admitting: General Practice

## 2016-11-05 DIAGNOSIS — I5031 Acute diastolic (congestive) heart failure: Secondary | ICD-10-CM

## 2016-11-05 LAB — COMPREHENSIVE METABOLIC PANEL
ALK PHOS: 55 U/L (ref 38–126)
ALT: 14 U/L (ref 14–54)
AST: 15 U/L (ref 15–41)
Albumin: 3.4 g/dL — ABNORMAL LOW (ref 3.5–5.0)
Anion gap: 6 (ref 5–15)
BILIRUBIN TOTAL: 0.5 mg/dL (ref 0.3–1.2)
BUN: 17 mg/dL (ref 6–20)
CALCIUM: 8.9 mg/dL (ref 8.9–10.3)
CO2: 31 mmol/L (ref 22–32)
Chloride: 98 mmol/L — ABNORMAL LOW (ref 101–111)
Creatinine, Ser: 0.51 mg/dL (ref 0.44–1.00)
GFR calc Af Amer: >60 mL/min (ref >60)
Glucose, Bld: 192 mg/dL — ABNORMAL HIGH (ref 65–99)
POTASSIUM: 3.9 mmol/L (ref 3.5–5.1)
Sodium: 135 mmol/L (ref 135–145)
TOTAL PROTEIN: 6.4 g/dL — AB (ref 6.5–8.1)

## 2016-11-05 LAB — CBC
HEMATOCRIT: 31.1 % — AB (ref 36.0–46.0)
Hemoglobin: 10.7 g/dL — ABNORMAL LOW (ref 12.0–15.0)
MCH: 31.1 pg (ref 26.0–34.0)
MCHC: 34.4 g/dL (ref 30.0–36.0)
MCV: 90.4 fL (ref 78.0–100.0)
Platelets: 199 10*3/uL (ref 150–400)
RBC: 3.44 MIL/uL — ABNORMAL LOW (ref 3.87–5.11)
RDW: 16.5 % — ABNORMAL HIGH (ref 11.5–15.5)
WBC: 7.3 10*3/uL (ref 4.0–10.5)

## 2016-11-05 LAB — GLUCOSE, CAPILLARY
GLUCOSE-CAPILLARY: 188 mg/dL — AB (ref 65–99)
GLUCOSE-CAPILLARY: 300 mg/dL — AB (ref 65–99)
Glucose-Capillary: 289 mg/dL — ABNORMAL HIGH (ref 65–99)
Glucose-Capillary: 225 mg/dL — ABNORMAL HIGH (ref 65–99)

## 2016-11-05 MED ORDER — INSULIN ASPART 100 UNIT/ML ~~LOC~~ SOLN
4.0000 [IU] | Freq: Once | SUBCUTANEOUS | Status: AC
  Administered 2016-11-05: 4 [IU] via SUBCUTANEOUS

## 2016-11-05 MED ORDER — IPRATROPIUM-ALBUTEROL 0.5-2.5 (3) MG/3ML IN SOLN
3.0000 mL | Freq: Four times a day (QID) | RESPIRATORY_TRACT | Status: DC
  Administered 2016-11-05 (×3): 3 mL via RESPIRATORY_TRACT
  Filled 2016-11-05 (×3): qty 3

## 2016-11-05 MED ORDER — INSULIN ASPART 100 UNIT/ML ~~LOC~~ SOLN
5.0000 [IU] | Freq: Three times a day (TID) | SUBCUTANEOUS | Status: DC
  Administered 2016-11-05 – 2016-11-06 (×3): 5 [IU] via SUBCUTANEOUS

## 2016-11-05 MED ORDER — IPRATROPIUM-ALBUTEROL 0.5-2.5 (3) MG/3ML IN SOLN
3.0000 mL | Freq: Three times a day (TID) | RESPIRATORY_TRACT | Status: DC
  Administered 2016-11-06: 3 mL via RESPIRATORY_TRACT
  Filled 2016-11-05: qty 3

## 2016-11-05 MED ORDER — ORAL CARE MOUTH RINSE
15.0000 mL | Freq: Two times a day (BID) | OROMUCOSAL | Status: DC
  Administered 2016-11-05 – 2016-11-06 (×3): 15 mL via OROMUCOSAL

## 2016-11-05 MED ORDER — FUROSEMIDE 10 MG/ML IJ SOLN
40.0000 mg | Freq: Once | INTRAMUSCULAR | Status: AC
  Administered 2016-11-05: 40 mg via INTRAVENOUS
  Filled 2016-11-05: qty 4

## 2016-11-05 NOTE — Progress Notes (Signed)
PROGRESS NOTE                                                                                                                                                                                                             Patient Demographics:    Mary [name], is a 66 y.o. female, DOB - 1951/03/13, XHB:[id number]  Admit date - 11/04/2016   Admitting Physician Mauricio Gerome Apley, MD  Outpatient Primary MD for the patient is Wende Neighbors, MD  LOS - 1  Chief Complaint  Patient presents with  . Shortness of Breath       Brief Narrative  Mary [name] is a 65 y.o. female with medical history significant of COPD who presents to the hospital with worsening shortness of breath. Her symptoms had an acute worsening over last 12 hours, this morning patient woke up with severe dyspnea, no improving, worse with exertion, associated with congestion, increase mucus production, no associated fevers but generalized weakness. Her dyspnea has been worsening to the point where she is dyspneic with minimal efforts. She has not been at her baseline since late November last year when she was diagnosed with COPD exacerbation, back then  she received antibiotics and steroids with mild improvement of her symptoms. Lately she has been using a cane for ambulation due to generalized weakness and dyspnea on exertion.    Subjective:    Mary [name] today has, No headache, No chest pain, No abdominal pain - No Nausea, No new weakness tingling or numbness, Improved Cough - SOB.     Assessment  & Plan :     1.Hypoxic respiratory failure due to combination of mild COPD exacerbation and acute on chronic combined systolic and diastolic heart failure. EF 30%.  She is currently on combination of IV Lasix, IV steroids, beta blocker, ACE inhibitor, oral azithromycin, nebulizer treatments and oxygen. She is clinically better, will increase activity, try to titrate off  oxygen. Likely discharge in the next 1-2 days.   2. CAD with ischemic cardiomyopathy, combined systolic and diastolic heart failure with EF 30%. Plan as above, in addition continue aspirin, statin, beta blocker for secondary prevention. She is chest pain-free. No acute issues from CAD standpoint.  3. Chronic atrial fibrillation. [place] vasc 2 score of at least 4. Continue combination of digoxin, beta blocker and per DEXA. Goal is  rate controlled.  4. DM type II. On sliding scale, will add premeal Novolog.  CBG (last 3)   Recent Labs  11/05/16 0745 11/05/16 1142  GLUCAP 188* 289*      Family Communication  : None  Code Status :  Full  Diet : Diet heart healthy/carb modified Room service appropriate? Yes; Fluid consistency: Thin   Disposition Plan  :  Home 1-2 days  Consults  :  None  Procedures  :  None  DVT Prophylaxis  :  Pradaxa  Lab Results  Component Value Date   PLT 199 11/05/2016    Inpatient Medications  Scheduled Meds: . ALPRAZolam  1 mg Oral QID  . aspirin EC  81 mg Oral Daily  . atorvastatin  40 mg Oral Daily  . azithromycin  500 mg Oral Daily  . dabigatran  150 mg Oral BID  . digoxin  125 mcg Oral Daily  . furosemide  40 mg Intravenous Once  . insulin aspart  0-9 Units Subcutaneous TID WC  . ipratropium-albuterol  3 mL Nebulization Q6H  . lisinopril  2.5 mg Oral Daily  . magnesium oxide  400 mg Oral Daily  . mouth rinse  15 mL Mouth Rinse BID  . methylPREDNISolone (SOLU-MEDROL) injection  40 mg Intravenous Q8H  . metoprolol tartrate  100 mg Oral q morning - 10a  . metoprolol tartrate  75 mg Oral Q2200  . mometasone-formoterol  2 puff Inhalation BID  . multivitamin with minerals  1 tablet Oral Daily   Continuous Infusions: PRN Meds:.acetaminophen **OR** [DISCONTINUED] acetaminophen, fluticasone, ipratropium-albuterol, [DISCONTINUED] ondansetron **OR** ondansetron (ZOFRAN) IV, sodium chloride  Antibiotics  :    Anti-infectives    Start      Dose/Rate Route Frequency Ordered Stop   11/05/16 1000  azithromycin (ZITHROMAX) tablet 500 mg     500 mg Oral Daily 11/04/16 2232           Objective:   Vitals:   11/04/16 2243 11/04/16 2355 11/05/16 0528 11/05/16 0752  BP: 118/67  135/69   Pulse: 74  87   Resp: 20  16   Temp: 98.1 F (36.7 C)  97.9 F (36.6 C)   TempSrc: Oral  Oral   SpO2: 93% 94% 96% 91%  Weight: 55.3 kg (122 lb)     Height: 5\' 7"  (1.702 m)       Wt Readings from Last 3 Encounters:  11/04/16 55.3 kg (122 lb)  09/17/16 52.6 kg (116 lb)  08/14/16 53.5 kg (118 lb)     Intake/Output Summary (Last 24 hours) at 11/05/16 1148 Last data filed at 11/05/16 0907  Gross per 24 hour  Intake              400 ml  Output              700 ml  Net             -300 ml     Physical Exam  Awake Alert, Oriented X 3, No new F.N deficits, Normal affect [place].AT,PERRAL Supple Neck,No JVD, No cervical lymphadenopathy appriciated.  Symmetrical Chest wall movement, Good air movement bilaterally, few rales RRR,No Gallops,Rubs or new Murmurs, No Parasternal Heave +ve B.Sounds, Abd Soft, No tenderness, No organomegaly appriciated, No rebound - guarding or rigidity. No Cyanosis, Clubbing or edema, No new Rash or bruise      Data Review:    CBC  Recent Labs Lab 11/04/16 1712 11/05/16 0612  WBC 9.4 7.3  HGB  9.9* 10.7*  HCT 30.8* 31.1*  PLT 211 199  MCV 92.2 90.4  MCH 29.6 31.1  MCHC 32.1 34.4  RDW 16.9* 16.5*  LYMPHSABS 2.0  --   MONOABS 1.0  --   EOSABS 0.1  --   BASOSABS 0.1  --     Chemistries   Recent Labs Lab 11/04/16 1712 11/05/16 0612  NA 139 135  K 4.1 3.9  CL 101 98*  CO2 28 31  GLUCOSE 102* 192*  BUN 9 17  CREATININE 0.67 0.51  CALCIUM 9.4 8.9  AST  --  15  ALT  --  14  ALKPHOS  --  55  BILITOT  --  0.5   ------------------------------------------------------------------------------------------------------------------ No results for input(s): CHOL, HDL, LDLCALC, TRIG, CHOLHDL,  LDLDIRECT in the last 72 hours.  Lab Results  Component Value Date   HGBA1C 8.6 (H) 01/25/2015   ------------------------------------------------------------------------------------------------------------------ No results for input(s): TSH, T4TOTAL, T3FREE, THYROIDAB in the last 72 hours.  Invalid input(s): FREET3 ------------------------------------------------------------------------------------------------------------------ No results for input(s): VITAMINB12, FOLATE, FERRITIN, TIBC, IRON, RETICCTPCT in the last 72 hours.  Coagulation profile No results for input(s): INR, PROTIME in the last 168 hours.  No results for input(s): DDIMER in the last 72 hours.  Cardiac Enzymes  Recent Labs Lab 11/04/16 1712  TROPONINI <0.03   ------------------------------------------------------------------------------------------------------------------    Component Value Date/Time   BNP 496.0 (H) 11/04/2016 1712    Micro Results No results found for this or any previous visit (from the past 240 hour(s)).  Radiology Reports Dg Chest 2 View  Result Date: 11/04/2016 CLINICAL DATA:  Cough and chest pain EXAM: CHEST  2 VIEW COMPARISON:  September 17, 2016 FINDINGS: There is right middle lobe atelectatic change. No edema or consolidation. Heart size and pulmonary vascularity are normal. No adenopathy. There is atherosclerotic calcification in the aorta. Patient is status post coronary artery bypass grafting. There is a left atrial appendage clamp. There is calcification in the right carotid artery. No bone lesions evident. IMPRESSION: Right middle lobe atelectasis. No edema or consolidation. Heart size within normal limits. There is aortic atherosclerosis and calcification in the right carotid artery. Electronically Signed   By: Lowella Grip III M.D.   On: 11/04/2016 15:57    Time Spent in minutes  30   SINGH,PRASHANT K M.D on 11/05/2016 at 11:48 AM  Between 7am to 7pm - Pager -  [phone]  After 7pm go to [email or web address] - password [facility]  [facility] -  Office  [phone]

## 2016-11-06 DIAGNOSIS — Z23 Encounter for immunization: Secondary | ICD-10-CM | POA: Diagnosis not present

## 2016-11-06 LAB — CBC
HEMATOCRIT: 31.4 % — AB (ref 36.0–46.0)
Hemoglobin: 10.4 g/dL — ABNORMAL LOW (ref 12.0–15.0)
MCH: 29.6 pg (ref 26.0–34.0)
MCHC: 33.1 g/dL (ref 30.0–36.0)
MCV: 89.5 fL (ref 78.0–100.0)
PLATELETS: 213 10*3/uL (ref 150–400)
RBC: 3.51 MIL/uL — AB (ref 3.87–5.11)
RDW: 16.6 % — ABNORMAL HIGH (ref 11.5–15.5)
WBC: 9.6 10*3/uL (ref 4.0–10.5)

## 2016-11-06 LAB — BASIC METABOLIC PANEL
ANION GAP: 5 (ref 5–15)
BUN: 15 mg/dL (ref 6–20)
CO2: 34 mmol/L — AB (ref 22–32)
Calcium: 9.3 mg/dL (ref 8.9–10.3)
Chloride: 96 mmol/L — ABNORMAL LOW (ref 101–111)
Creatinine, Ser: 0.52 mg/dL (ref 0.44–1.00)
GLUCOSE: 245 mg/dL — AB (ref 65–99)
POTASSIUM: 3.9 mmol/L (ref 3.5–5.1)
Sodium: 135 mmol/L (ref 135–145)

## 2016-11-06 LAB — GLUCOSE, CAPILLARY: Glucose-Capillary: 237 mg/dL — ABNORMAL HIGH (ref 65–99)

## 2016-11-06 MED ORDER — METHYLPREDNISOLONE SODIUM SUCC 40 MG IJ SOLR: 40.0000 mg | Freq: Every day | INTRAMUSCULAR | Status: DC

## 2016-11-06 MED ORDER — AZITHROMYCIN 500 MG PO TABS: 500.0000 mg | ORAL_TABLET | Freq: Every day | ORAL | 0 refills | Status: AC

## 2016-11-06 MED ORDER — FUROSEMIDE 20 MG PO TABS: 40.0000 mg | ORAL_TABLET | Freq: Every day | ORAL | 0 refills | Status: DC

## 2016-11-06 MED ORDER — FUROSEMIDE 10 MG/ML IJ SOLN
40.0000 mg | Freq: Once | INTRAMUSCULAR | Status: AC
  Administered 2016-11-06: 40 mg via INTRAVENOUS
  Filled 2016-11-06: qty 4

## 2016-11-06 MED ORDER — POTASSIUM CHLORIDE CRYS ER 20 MEQ PO TBCR
20.0000 meq | EXTENDED_RELEASE_TABLET | Freq: Every day | ORAL | 0 refills | Status: DC
Start: 2016-11-06 — End: 2018-11-24

## 2016-11-06 MED ORDER — METHYLPREDNISOLONE 4 MG PO TBPK: ORAL_TABLET | ORAL | 0 refills | Status: DC

## 2016-11-06 NOTE — Progress Notes (Signed)
Inpatient Diabetes Program Recommendations  AACE/ADA: New Consensus Statement on Inpatient Glycemic Control (2015)  Target Ranges:  Prepandial:   less than 140 mg/dL      Peak postprandial:   less than 180 mg/dL (1-2 hours)      Critically ill patients:  140 - 180 mg/dL   Results for Mary [name], Mary [name] (MRN [id number]) as of 11/06/2016 08:28  Ref. Range 11/05/2016 07:45 11/05/2016 11:42 11/05/2016 16:41 11/05/2016 21:29 11/06/2016 07:44  Glucose-Capillary Latest Ref Range: 65 - 99 mg/dL 188 (H) 289 (H) 225 (H) 300 (H) 237 (H)   Review of Glycemic Control  Current orders for Inpatient glycemic control: Novolog 0-9 units TID with meals, Novolog 5 units TID with meals for meal coverage  Inpatient Diabetes Program Recommendations: Correction (SSI): Please consider ordering Novolog bedtime correction scale.   NOTE: Noted steroids have been decreased to Solumedrol 40 mg daily.  Thanks, [name], RN, MSN, CDE Diabetes Coordinator Inpatient Diabetes Program [phone] (Team Pager from 8am to 5pm)

## 2016-11-06 NOTE — Progress Notes (Signed)
Patient with orders to be discharge home. Discharge instructions given, patient verbalized understanding. Prescriptions given. Patient stable. Patient left in private vehicle with friend.

## 2016-11-06 NOTE — Discharge Instructions (Signed)
Follow with Primary MD Wende Neighbors, MD in 7 days   Get CBC, CMP, 2 view Chest X ray checked  by Primary MD or SNF MD in 5-7 days ( we routinely change or add medications that can affect your baseline labs and fluid status, therefore we recommend that you get the mentioned basic workup next visit with your PCP, your PCP may decide not to get them or add new tests based on their clinical decision)   Activity: As tolerated with Full fall precautions use walker/cane & assistance as needed   Disposition Home     Diet:   Diet heart healthy/carb modified  Check your Weight same time everyday, if you gain over 2 pounds, or you develop in leg swelling, experience more shortness of breath or chest pain, call your Primary MD immediately. Follow Cardiac Low Salt Diet and 1.5 lit/day fluid restriction.   On your next visit with your primary care physician please Get Medicines reviewed and adjusted.   Please request your Prim.MD to go over all Hospital Tests and Procedure/Radiological results at the follow up, please get all Hospital records sent to your Prim MD by signing hospital release before you go home.   If you experience worsening of your admission symptoms, develop shortness of breath, life threatening emergency, suicidal or homicidal thoughts you must seek medical attention immediately by calling 911 or calling your MD immediately  if symptoms less severe.  You Must read complete instructions/literature along with all the possible adverse reactions/side effects for all the Medicines you take and that have been prescribed to you. Take any new Medicines after you have completely understood and accpet all the possible adverse reactions/side effects.   Do not drive, operate heavy machinery, perform activities at heights, swimming or participation in water activities or provide baby sitting services if your were admitted for syncope or siezures until you have seen by Primary MD or a Neurologist and  advised to do so again.  Do not drive when taking Pain medications.    Do not take more than prescribed Pain, Sleep and Anxiety Medications  Special Instructions: If you have smoked or chewed Tobacco  in the last 2 yrs please stop smoking, stop any regular Alcohol  and or any Recreational drug use.  Wear Seat belts while driving.   Please note  You were cared for by a hospitalist during your hospital stay. If you have any questions about your discharge medications or the care you received while you were in the hospital after you are discharged, you can call the unit and asked to speak with the hospitalist on call if the hospitalist that took care of you is not available. Once you are discharged, your primary care physician will handle any further medical issues. Please note that NO REFILLS for any discharge medications will be authorized once you are discharged, as it is imperative that you return to your primary care physician (or establish a relationship with a primary care physician if you do not have one) for your aftercare needs so that they can reassess your need for medications and monitor your lab values.

## 2016-11-06 NOTE — Discharge Summary (Signed)
Mary [name] VAP:[id number] DOB: 09-Oct-1951 DOA: 11/04/2016  PCP: Wende Neighbors, MD  Admit date: 11/04/2016  Discharge date: 11/06/2016  Admitted From: Home   Disposition:  Home   Recommendations for Outpatient Follow-up:   Follow up with PCP in 1-2 weeks  PCP Please obtain BMP/CBC, 2 view CXR in 1 week,  (see Discharge instructions)   PCP Please follow up on the following pending results: None   Home Health: None   Equipment/Devices: None  Consultations: None Discharge Condition: Stable   CODE STATUS: Full   Diet Recommendation: Diet heart healthy/carb modified , 1.5 L per day fluid restriction  Chief Complaint  Patient presents with  . Shortness of Breath     Brief history of present illness from the day of admission and additional interim summary    Mary [name] a 66 y.o.femalewith medical history significant of COPD who presents to the hospital with worsening shortness of breath. Her symptoms had an acute worsening over last 12 hours, this morning patient woke upwith severe dyspnea, no improving,worse with exertion, associated with congestion, increase mucus production, no associated fevers but generalized weakness. Her dyspnea has been worsening to the point where she is dyspneic with minimal efforts. She has not been at her baseline since late November last year when she was diagnosed with COPD exacerbation, back then she received antibiotics and steroids with mild improvement of her symptoms. Lately she has been using a cane for ambulation due to generalized weakness and dyspnea on exertion.    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 However  her BNP was remarkably elevated and she had rales on exam.  She responded very well to IV Lasix along with low-dose steroids and Z-Pak, her lungs are clear, she is not short of breath, no oxygen demand, will be discharged home on oral Lasix, Medrol Dosepak and 3 more days of azithromycin. My suspicion is she had acute on chronic left-sided heart failure but surprisingly recent echo was completely unremarkable. I will have her follow with her primary cardiologist within a week along with PCP. Request PCP to monitor weight, BMP in diuretic dose closely.   2. CAD . Plan as above, in addition continue aspirin, statin, beta blocker for secondary prevention. She is chest pain-free. No acute issues from CAD standpoint.  3. Chronic atrial fibrillation. [place] vasc 2 score of at least 4. Continue combination of digoxin, beta blocker and Pradexa, Goal is rate control.  4. DM type II. Tinea home regimen unchanged.   Discharge diagnosis     Active Problems:   COPD (chronic obstructive pulmonary disease) (HCC)   Atrial fibrillation (HCC)   Atrial fibrillation with rapid ventricular response (HCC)   S/P CABG x 3   Acute diastolic CHF (congestive heart failure) (HCC)   CAD (coronary artery disease)   COPD with acute exacerbation [facility])    Discharge  instructions    Discharge Instructions    Discharge instructions    Complete by:  As directed    Follow with Primary MD Wende Neighbors, MD in 7 days   Get CBC, CMP, 2 view Chest X ray checked  by Primary MD or SNF MD in 5-7 days ( we routinely change or add medications that can affect your baseline labs and fluid status, therefore we recommend that you get the mentioned basic workup next visit with your PCP, your PCP may decide not to get them or add new tests based on their clinical decision)   Activity: As tolerated with Full fall precautions use walker/cane & assistance as needed   Disposition Home     Diet:   Diet heart healthy/carb modified   Check your Weight same time everyday, if you gain over 2 pounds, or you develop in leg swelling, experience more shortness of breath or chest pain, call your Primary MD immediately. Follow Cardiac Low Salt Diet and 1.5 lit/day fluid restriction.   On your next visit with your primary care physician please Get Medicines reviewed and adjusted.   Please request your Prim.MD to go over all Hospital Tests and Procedure/Radiological results at the follow up, please get all Hospital records sent to your Prim MD by signing hospital release before you go home.   If you experience worsening of your admission symptoms, develop shortness of breath, life threatening emergency, suicidal or homicidal thoughts you must seek medical attention immediately by calling 911 or calling your MD immediately  if symptoms less severe.  You Must read complete instructions/literature along with all the possible adverse reactions/side effects for all the Medicines you take and that have been prescribed to you. Take any new Medicines after you have completely understood and accpet all the possible adverse reactions/side effects.   Do not drive, operate heavy machinery, perform activities at heights, swimming or participation in water activities or provide baby sitting services if your were admitted for syncope or siezures until you have seen by Primary MD or a Neurologist and advised to do so again.  Do not drive when taking Pain medications.    Do not take more than prescribed Pain, Sleep and Anxiety Medications  Special Instructions: If you have smoked or chewed Tobacco  in the last 2 yrs please stop smoking, stop any regular Alcohol  and or any Recreational drug use.  Wear Seat belts while driving.   Please note  You were cared for by a hospitalist during your hospital stay. If you have any questions about your discharge medications or the care you received while you were in the hospital after you are discharged, you  can call the unit and asked to speak with the hospitalist on call if the hospitalist that took care of you is not available. Once you are discharged, your primary care physician will handle any further medical issues. Please note that NO REFILLS for any discharge medications will be authorized once you are discharged, as it is imperative that you return to your primary care physician (or establish a relationship with a primary care physician if you do not have one) for your aftercare needs so that they can reassess your need for medications and monitor your lab values.   Increase activity slowly    Complete by:  As directed       Discharge Medications   Allergies as of 11/06/2016      Reactions   Adhesive [tape] Other (See Comments)  Tears skin off.    Latex Other (See Comments)   In tape, tears skin off.   Zocor [simvastatin - High Dose] Nausea And Vomiting      Medication List    TAKE these medications   ACCU-CHEK AVIVA PLUS test strip Generic drug:  glucose blood USE TO TEST 3 TO 4 TIMES DAILY OR AS DIRECTED.   acetaminophen 500 MG tablet Commonly known as:  TYLENOL Take 500 mg by mouth every 6 (six) hours as needed for mild pain.   albuterol 108 (90 Base) MCG/ACT inhaler Commonly known as:  PROAIR HFA Inhale 2 puffs into the lungs every 6 (six) hours as needed for wheezing or shortness of breath.   ALPRAZolam 1 MG tablet Commonly known as:  XANAX Take 1 mg by mouth 4 (four) times daily.   aspirin EC 81 MG tablet Take 81 mg by mouth daily.   atorvastatin 40 MG tablet Commonly known as:  LIPITOR Take 1 tablet (40 mg total) by mouth daily. KEEP OV.   azithromycin 500 MG tablet Commonly known as:  ZITHROMAX Take 1 tablet (500 mg total) by mouth daily. What changed:  medication strength  how much to take  how to take this  when to take this  additional instructions   DIGOX 0.125 MG tablet Generic drug:  digoxin TAKE ONE TABLET BY MOUTH ONCE DAILY.     fluticasone 50 MCG/ACT nasal spray Commonly known as:  FLONASE Place 2 sprays into both nostrils daily. What changed:  when to take this  reasons to take this   furosemide 20 MG tablet Commonly known as:  LASIX Take 2 tablets (40 mg total) by mouth daily. What changed:  how much to take   glipiZIDE 2.5 MG 24 hr tablet Commonly known as:  GLUCOTROL XL 2.5 mg daily.   JANUVIA 100 MG tablet Generic drug:  sitaGLIPtin TAKE ONE TABLET BY MOUTH DAILY.   lisinopril 2.5 MG tablet Commonly known as:  PRINIVIL,ZESTRIL Take 1 tablet (2.5 mg total) by mouth daily.   magnesium oxide 400 MG tablet Commonly known as:  MAG-OX Take 400 mg by mouth daily.   metFORMIN 850 MG tablet Commonly known as:  GLUCOPHAGE TAKE 1 TABLET BY MOUTH TWICE DAILY WITH MEALS.   methylPREDNISolone 4 MG Tbpk tablet Commonly known as:  MEDROL DOSEPAK follow package directions   metoprolol 50 MG tablet Commonly known as:  LOPRESSOR Take 2 tablets in the morning and 1 1/2 tablets at night   multivitamin capsule Take 1 capsule by mouth daily.   ondansetron 4 MG disintegrating tablet Commonly known as:  ZOFRAN ODT 4mg  ODT q4 hours prn nausea/vomit   potassium chloride SA 20 MEQ tablet Commonly known as:  K-DUR,KLOR-CON Take 1 tablet (20 mEq total) by mouth daily. What changed:  how much to take  when to take this  reasons to take this   PRADAXA 150 MG Caps capsule Generic drug:  dabigatran TAKE ONE CAPSULE BY MOUTH TWICE DAILY.   sodium chloride 0.65 % nasal spray Commonly known as:  OCEAN Place 1 spray into the nose as needed for congestion. Congestion   SYMBICORT 160-4.5 MCG/ACT inhaler Generic drug:  budesonide-formoterol INHALE 2 PUFFS INTO THE LUNGS TWICE DAILY. RINSE MOUTH AFTER USE.       Follow-up Information    Wende Neighbors, MD. Schedule an appointment as soon as possible for a visit in 1 week(s).   Specialty:  Internal Medicine Contact information: [address] [place] [address]  Gary, MD. Schedule an appointment as soon as possible for a visit in 1 week(s).   Specialty:  Cardiology Contact information: [address] [place] [address] [phone]           Major procedures and Radiology Reports - PLEASE review detailed and final reports thoroughly  -         Dg Chest 2 View  Result Date: 11/04/2016 CLINICAL DATA:  Cough and chest pain EXAM: CHEST  2 VIEW COMPARISON:  September 17, 2016 FINDINGS: There is right middle lobe atelectatic change. No edema or consolidation. Heart size and pulmonary vascularity are normal. No adenopathy. There is atherosclerotic calcification in the aorta. Patient is status post coronary artery bypass grafting. There is a left atrial appendage clamp. There is calcification in the right carotid artery. No bone lesions evident. IMPRESSION: Right middle lobe atelectasis. No edema or consolidation. Heart size within normal limits. There is aortic atherosclerosis and calcification in the right carotid artery. Electronically Signed   By: Lowella Grip III M.D.   On: 11/04/2016 15:57    Micro Results     No results found for this or any previous visit (from the past 240 hour(s)).  Today   Subjective    Mary [name] today has no headache,no chest abdominal pain,no new weakness tingling or numbness, feels much better wants to go home today.     Objective   Blood pressure 120/69, pulse 67, temperature 97.6 F (36.4 C), temperature source Oral, resp. rate 18, height 5\' 7"  (1.702 m), weight 55.3 kg (122 lb), SpO2 98 %.   Intake/Output Summary (Last 24 hours) at 11/06/16 0908 Last data filed at 11/06/16 9702  Gross per 24 hour  Intake             1180 ml  Output             2400 ml  Net            -1220 ml    Exam Awake Alert, Oriented x 3, No new F.N deficits, Normal affect [place].AT,PERRAL Supple Neck,No JVD, No cervical lymphadenopathy appriciated.   Symmetrical Chest wall movement, Good air movement bilaterally, CTAB RRR,No Gallops,Rubs or new Murmurs, No Parasternal Heave +ve B.Sounds, Abd Soft, Non tender, No organomegaly appriciated, No rebound -guarding or rigidity. No Cyanosis, Clubbing or edema, No new Rash or bruise   Data Review   CBC w Diff:  Lab Results  Component Value Date   WBC 9.6 11/06/2016   HGB 10.4 (L) 11/06/2016   HCT 31.4 (L) 11/06/2016   PLT 213 11/06/2016   LYMPHOPCT 21 11/04/2016   MONOPCT 10 11/04/2016   EOSPCT 1 11/04/2016   BASOPCT 1 11/04/2016    CMP:  Lab Results  Component Value Date   NA 135 11/06/2016   K 3.9 11/06/2016   CL 96 (L) 11/06/2016   CO2 34 (H) 11/06/2016   BUN 15 11/06/2016   CREATININE 0.52 11/06/2016   CREATININE 0.48 (L) 02/20/2015   PROT 6.4 (L) 11/05/2016   ALBUMIN 3.4 (L) 11/05/2016   BILITOT 0.5 11/05/2016   ALKPHOS 55 11/05/2016   AST 15 11/05/2016   ALT 14 11/05/2016  .   Total Time in preparing paper work, data evaluation and todays exam - 35 minutes  Thurnell Lose M.D on 11/06/2016 at 9:08 AM  [facility]   Office  [phone]

## 2016-11-06 NOTE — Progress Notes (Signed)
Patient O2 sats 96% room air. Patient O2 sats 96% room air while ambulating.

## 2016-11-10 DIAGNOSIS — I482 Chronic atrial fibrillation: Secondary | ICD-10-CM | POA: Diagnosis not present

## 2016-11-10 DIAGNOSIS — J449 Chronic obstructive pulmonary disease, unspecified: Secondary | ICD-10-CM | POA: Diagnosis not present

## 2016-11-10 DIAGNOSIS — D509 Iron deficiency anemia, unspecified: Secondary | ICD-10-CM | POA: Diagnosis not present

## 2016-11-10 DIAGNOSIS — I2510 Atherosclerotic heart disease of native coronary artery without angina pectoris: Secondary | ICD-10-CM | POA: Diagnosis not present

## 2016-11-20 DIAGNOSIS — E782 Mixed hyperlipidemia: Secondary | ICD-10-CM | POA: Diagnosis not present

## 2016-11-20 DIAGNOSIS — E119 Type 2 diabetes mellitus without complications: Secondary | ICD-10-CM | POA: Diagnosis not present

## 2016-11-20 DIAGNOSIS — D509 Iron deficiency anemia, unspecified: Secondary | ICD-10-CM | POA: Diagnosis not present

## 2016-11-24 DIAGNOSIS — Z8673 Personal history of transient ischemic attack (TIA), and cerebral infarction without residual deficits: Secondary | ICD-10-CM | POA: Diagnosis not present

## 2016-11-24 DIAGNOSIS — I482 Chronic atrial fibrillation: Secondary | ICD-10-CM | POA: Diagnosis not present

## 2016-11-24 DIAGNOSIS — I2510 Atherosclerotic heart disease of native coronary artery without angina pectoris: Secondary | ICD-10-CM | POA: Diagnosis not present

## 2016-11-24 DIAGNOSIS — J449 Chronic obstructive pulmonary disease, unspecified: Secondary | ICD-10-CM | POA: Diagnosis not present

## 2016-11-24 DIAGNOSIS — E1140 Type 2 diabetes mellitus with diabetic neuropathy, unspecified: Secondary | ICD-10-CM | POA: Diagnosis not present

## 2016-12-22 ENCOUNTER — Other Ambulatory Visit: Payer: Self-pay | Admitting: Cardiovascular Disease

## 2017-01-20 ENCOUNTER — Encounter: Payer: Self-pay | Admitting: Cardiovascular Disease

## 2017-01-20 ENCOUNTER — Ambulatory Visit (INDEPENDENT_AMBULATORY_CARE_PROVIDER_SITE_OTHER): Payer: Medicare Other | Admitting: Cardiovascular Disease

## 2017-01-20 VITALS — BP 120/74 | HR 66 | Ht 67.0 in | Wt 128.0 lb

## 2017-01-20 DIAGNOSIS — I2510 Atherosclerotic heart disease of native coronary artery without angina pectoris: Secondary | ICD-10-CM

## 2017-01-20 DIAGNOSIS — I10 Essential (primary) hypertension: Secondary | ICD-10-CM | POA: Diagnosis not present

## 2017-01-20 DIAGNOSIS — G478 Other sleep disorders: Secondary | ICD-10-CM | POA: Diagnosis not present

## 2017-01-20 DIAGNOSIS — I482 Chronic atrial fibrillation: Secondary | ICD-10-CM

## 2017-01-20 DIAGNOSIS — R0989 Other specified symptoms and signs involving the circulatory and respiratory systems: Secondary | ICD-10-CM

## 2017-01-20 DIAGNOSIS — R0683 Snoring: Secondary | ICD-10-CM | POA: Diagnosis not present

## 2017-01-20 DIAGNOSIS — Z7901 Long term (current) use of anticoagulants: Secondary | ICD-10-CM

## 2017-01-20 DIAGNOSIS — I2583 Coronary atherosclerosis due to lipid rich plaque: Secondary | ICD-10-CM

## 2017-01-20 DIAGNOSIS — I4821 Permanent atrial fibrillation: Secondary | ICD-10-CM

## 2017-01-20 NOTE — Patient Instructions (Signed)
Medication Instructions:   NO CHANGES  Labwork:  NONE ORDERED  Testing/Procedures:  Your physician has requested that you have a carotid duplex. This test is an ultrasound of the carotid arteries in your neck. It looks at blood flow through these arteries that supply the brain with blood. Allow one hour for this exam. There are no restrictions or special instructions.  Your physician has recommended that you have a sleep study. This test records several body functions during sleep, including: brain activity, eye movement, oxygen and carbon dioxide blood levels, heart rate and rhythm, breathing rate and rhythm, the flow of air through your mouth and nose, snoring, body muscle movements, and chest and belly movement.    Follow-Up: Your physician wants you to follow-up in: 6 MONTHS OR SOONER IF NEEDED. You will receive a reminder letter in the mail two months in advance. If you don't receive a letter, please call our office to schedule the follow-up appointment.   Any Other Special Instructions Will Be Listed Below (If Applicable).  If you need a refill on your cardiac medications before your next appointment, please call your pharmacy.

## 2017-01-20 NOTE — Progress Notes (Addendum)
Patient ID: Mary [name], female   DOB: 16-Jul-1951, 66 y.o.   MRN: [id number]    PCP: Dr. Wende Neighbors  HPI: Mary [name] is a 66 y.o. female who presents to the office for a 6 month followup cardiology evaluation.   Mary [name] has a history of permanent atrial fibrillation, hypertension, type 2 diabetes mellitus, COPD and long-standing prior history of tobacco use. In February 2014 she was hospitalized and felt to have diastolic congestive heart failure. She has been on low-dose ACE inhibitor. An echo Doppler in November 2013 showed an ejection fraction of 55-60% with grade 1 diastolic dysfunction, moderate mitral regurgitation, moderate tricuspid regurgitation, biatrial enlargement.   On 01/07/2014  she underwent cardiac catheterization after she  presented to the hospital with left-sided chest pain, associated with diaphoresis, weakness, nausea and vomiting.  Cardiac catheterization demonstrated 80% tapered calcified left main stenosis with 3 vessel CAD involving a subtotal proximal to mid LAD stenosis with TIMI 2 flow, an occluded circumflex vessel.  After the first large bifurcating obtuse marginal branch as well as 70% mid AV groove stenosis and a small-caliber occluded mid RCA.  She underwent CABG revascularization surgery on 01/09/2014 by Dr. Tharon Aquas Tright and had a LIMA placed to the LAD, a saphenous vein graft to the ramus intermediate, saphenous vein graft to the circumflex marginal vessel.  She also had application of a left atrial clip.  Preoperative ejection fraction was 30%.  In April 2015 she was on a medical regimen, consisting of Lasix 40 mg, lisinopril 2.5 mg twice a day, digoxin, 0.125 mg, metoprolol tartrate 12.5 twice a day.  An echo Doppler study on 04/17/2014 showed improvement in her Ejection fraction at 55-60%, although she had a pseudo-normal left ventricular filling pattern consistent with grade 2 diastolic dysfunction.  Mild hypokinesis of the mid, anteroseptal, basal,  inferoseptal and apical septal wall.  Mitral annular calcification, mild MR, moderate LA, and mild RA dilatation with mild TR. She had been in sinus rhythm and when last seen by me July was maintaining sinus rhythm.  At that time, I discontinued her Lanoxin and further titrated her metoprolol tartrate from 12.5 twice a day to 25 mg twice a day.  Postoperatively, she had converted initially to sinus rhythm and was in normal sinus rhythm as of her July 2015 evaluation.  A nuclear perfusion study on 01/03/2015 which revealed normal perfusion.  The study was not gated due to her atrial fibrillation.  Apparently, she had recently experienced a presyncopal episode.  She was referred for MR of her brain which raised the possibility of a small nonhemorrhagic infarct within the subcortical white matter of the right postcentral gyrus.  There also is moderate  Age advanced periventricular and subcortical T2 changes.  She is followed by Dr. Buelah Manis. She tells me she is scheduled to have an EEG later this week.  An echo Doppler study in March 2017 showed an EF of 55-60%.  She had trivial aortic insufficiency.  A peak gradient of her aortic valve was 10 mm.  There was mild aortic regurgitation.  She underwent carotid duplex imaging which showed heterogeneous plaque bilaterally.  There were essentially stable 40-59% right internal carotid artery stenoses and 60-79% left internal carotid artery stenoses.  She greater than 50% bilateral external carotid stenosis.  She had normal subclavian arteries bilaterally with antegrade flow to her vertebrals.  She admits to bilateral neuropathy in both lower extremities.  She denies significant edema.  She had had blood work  in [place] in July.  Her cholesterol was 104, triglycerides 103, HDL 38, and LDL 45.  Hemoglobin was 11.4 and hematocrit 35.7.  She does have reduced iron saturation at 8%.   Since I last saw her, she admits to being under increased stress.  She is chronically  fatigued and stays tired all the time.  Her sleep is very poor.  She wakes up at least 3 times per night for nocturia.  She was hospitalized on 11/04/2016 through 11/06/2016 with shortness of breath.  He was felt possibly to have combination of mild COPD exacerbation and acute on chronic CHF.  He responded to IV Lasix as well as low-dose steroids and a Z-Pak and ultimately was sent home.  She has been seen by Dr. Wende Neighbors was requested she undergo carotid ultrasound imaging.  She recently banged into a nightstand and developed significant ecchymosis of her hand and left leg.  He denies chest pressure.  She denies any increased heart rate.  Sensation and has chronic atrial fibrillation.  Results for evaluation  Past Medical History:  Diagnosis Date  . Anxiety   . Arthritis   . Atrial fibrillation ([place])   . Bronchial asthma   . CHF (congestive heart failure) ([place])   . Chronic pain    Bacl pain, Disc L5-S1- Dr. Joya Salm in [place]  . COPD (chronic obstructive pulmonary disease) ([place])   . DDD (degenerative disc disease)    Radicular symptoms  . Diabetes mellitus   . History of stress test 06/2011   Abnormal myocardial perfusion study.  Mary [name] Hx of echocardiogram    Was interpreted by Dr Doylene Canard that showed an Ef in the 50-60% range with grade 1 diastolic dysfunction. she had moderate MR, biatrial enlargement, moderate TR and at that time estimated RV systolic pressure was 43 mm.  . Hyperlipidemia   . Hypertension   . Neuropathy ([place])   . Shortness of breath   . Tachycardia     Past Surgical History:  Procedure Laterality Date  . ABDOMINAL HYSTERECTOMY     partial  . Breast cyst removed    . CARPAL TUNNEL RELEASE     x 2  . CORONARY ARTERY BYPASS GRAFT N/A 01/09/2014   Procedure: CORONARY ARTERY BYPASS GRAFTING (CABG) x 3 using endoscopically harvested right saphenous vein and left internal mammary artery and closure of left atrial appendage;  Surgeon: Ivin Poot, MD;  Location: [facility];   Service: Open Heart Surgery;  Laterality: N/A;  patient has preop IA BP   . INTRAOPERATIVE TRANSESOPHAGEAL ECHOCARDIOGRAM N/A 01/09/2014   Procedure: INTRAOPERATIVE TRANSESOPHAGEAL ECHOCARDIOGRAM;  Surgeon: Ivin Poot, MD;  Location: [facility];  Service: Open Heart Surgery;  Laterality: N/A;  . LEFT HEART CATHETERIZATION WITH CORONARY ANGIOGRAM N/A 01/07/2014   Procedure: LEFT HEART CATHETERIZATION WITH CORONARY ANGIOGRAM;  Surgeon: Lorretta Harp, MD;  Location: [facility] CATH LAB;  Service: Cardiovascular;  Laterality: N/A;  . TONSILLECTOMY    . TUBAL LIGATION      Allergies  Allergen Reactions  . Adhesive [Tape] Other (See Comments)    Tears skin off.   . Latex Other (See Comments)    In tape, tears skin off.  . Zocor [Simvastatin - High Dose] Nausea And Vomiting    Current Outpatient Prescriptions  Medication Sig Dispense Refill  . ACCU-CHEK AVIVA PLUS test strip USE TO TEST 3 TO 4 TIMES DAILY OR AS DIRECTED. 100 each PRN  . acetaminophen (TYLENOL) 500 MG tablet Take 500 mg by mouth  every 6 (six) hours as needed for mild pain.    Mary [name] albuterol (PROAIR HFA) 108 (90 BASE) MCG/ACT inhaler Inhale 2 puffs into the lungs every 6 (six) hours as needed for wheezing or shortness of breath. 18 g 11  . ALPRAZolam (XANAX) 1 MG tablet Take 1 mg by mouth 4 (four) times daily.     Mary [name] aspirin EC 81 MG tablet Take 81 mg by mouth daily.    Mary [name] atorvastatin (LIPITOR) 40 MG tablet Take 1 tablet (40 mg total) by mouth daily. KEEP OV. 90 tablet 1  . DIGOX 125 MCG tablet TAKE ONE TABLET BY MOUTH ONCE DAILY. 30 tablet 10  . fluticasone (FLONASE) 50 MCG/ACT nasal spray Place 2 sprays into both nostrils daily. (Patient taking differently: Place 2 sprays into both nostrils daily as needed for allergies. ) 16 g 6  . furosemide (LASIX) 40 MG tablet TAKE ONE TABLET BY MOUTH ONCE DAILY. 30 tablet 0  . glipiZIDE (GLUCOTROL XL) 2.5 MG 24 hr tablet 2.5 mg daily.     Mary [name] JANUVIA 100 MG tablet TAKE ONE TABLET BY MOUTH DAILY. 30  tablet 0  . lisinopril (PRINIVIL,ZESTRIL) 2.5 MG tablet Take 1 tablet (2.5 mg total) by mouth daily. 30 tablet 11  . Magnesium Oxide 250 MG TABS Take 250 mg by mouth daily.     . metFORMIN (GLUCOPHAGE) 850 MG tablet TAKE 1 TABLET BY MOUTH TWICE DAILY WITH MEALS. 60 tablet 0  . metoprolol (LOPRESSOR) 50 MG tablet Take 2 tablets in the morning and 1 1/2 tablets at night 105 tablet 11  . Multiple Vitamin (MULTIVITAMIN) capsule Take 1 capsule by mouth daily.    . potassium chloride SA (K-DUR,KLOR-CON) 20 MEQ tablet Take 1 tablet (20 mEq total) by mouth daily. 30 tablet 0  . PRADAXA 150 MG CAPS capsule TAKE ONE CAPSULE BY MOUTH TWICE DAILY. 60 capsule 10  . sodium chloride (OCEAN) 0.65 % nasal spray Place 1 spray into the nose as needed for congestion. Congestion    . SYMBICORT 160-4.5 MCG/ACT inhaler INHALE 2 PUFFS INTO THE LUNGS TWICE DAILY. RINSE MOUTH AFTER USE. 10.2 g 0   No current facility-administered medications for this visit.     Social History   Social History  . Marital status: Divorced    Spouse name: N/A  . Number of children: 1  . Years of education: 16   Occupational History  . Not on file.   Social History Main Topics  . Smoking status: Former Smoker    Packs/day: 1.50    Years: 30.00    Types: Cigarettes    Quit date: 05/26/2013  . Smokeless tobacco: Never Used     Comment: quit 6 months ago  . Alcohol use No  . Drug use: No  . Sexual activity: Yes    Birth control/ protection: Surgical   Other Topics Concern  . Not on file   Social History Narrative   Lives at home with sister and sister-in-law   Caffeine use : drink 1 cup coffee in morning        Socially she is divorce. She has one deceased child and 2 grandchildren. She quit tobacco at the end of August 2014.  She now has one great grandchild. There is no alcohol use. She does not routinely exercise.  Family History  Problem Relation Age of Onset  . Diabetes Mother   . Heart disease Mother   .  Diabetes Sister   . Hypertension Sister   .  Hyperlipidemia Sister   . Diabetes Brother   . Heart disease Brother   . Diabetes Brother   . Heart disease Brother    ROS General: Negative; No fevers, chills, or night sweats; positive for fatigue HEENT: Negative; No changes in vision or hearing, sinus congestion, difficulty swallowing Pulmonary: Positive for mild shortness of breath; history of COPD ;No cough, wheezing, , hemoptysis Cardiovascular: Positive for palpitations.  Mild shortness of breath with activity.  No chest pressure. GI: Positive for occasional nausea, no vomiting, diarrhea, or abdominal pain GU: Negative; No dysuria, hematuria, or difficulty voiding Musculoskeletal: Negative; no myalgias, joint pain, or weakness Hematologic/Oncology: Negative; no easy bruising, bleeding Endocrine: Negative; no heat/cold intolerance; positive for diabetes Neuro: Negative; no changes in balance, headaches Skin: Negative; No rashes or skin lesions Psychiatric: positive for anxiety depression Sleep: Negative; No snoring, daytime sleepiness, hypersomnolence, bruxism, restless legs, hypnogognic hallucinations, no cataplexy Other comprehensive 14 point system review is negative   PE BP 120/74   Pulse 66   Ht 5' 7"  (1.702 m)   Wt 128 lb (58.1 kg)   BMI 20.05 kg/m    Wt Readings from Last 3 Encounters:  01/20/17 128 lb (58.1 kg)  11/04/16 122 lb (55.3 kg)  09/17/16 116 lb (52.6 kg)   General: Alert, oriented, no distress.  Skin: normal turgor, no rashes HEENT: Normocephalic, atraumatic. Pupils round and reactive; sclera anicteric;no lid lag.  Nose without nasal septal hypertrophy Mouth/Parynx benign; Mallinpatti scale 2/3 Neck: No JVD,  Bilateral carotid bruit; left greater than right Chest wall: Mild tenderness over the sternotomy site with suggestion of very minimal separation of the upper sternum. Lungs: Decreased breath sound segmenter long-standing tobacco use. no wheezing or  rales Heart: PMI is not displaced.  Rhythm is irregularly irregular with a rate of approximately 75bpm; s1 s2 normal 4-2/8 systolic murmur; no diastolic murmur;  No rubs thrills or heaves Abdomen: soft, nontender; no hepatosplenomehaly, BS+; abdominal aorta nontender and not dilated by palpation. Pulses 2+ Extremities:  Resolution of prior leg edema; no clubbing cyanosis, Homan's sign negative  Neurologic: grossly nonfocal Psychologic: Normal affect and mood  ECG (independently read by me): To fibrillation at 66 bpm.  Right bundle branch block with repolarization changes.  September 2017 ECG (independently read by me): Atrial fibrillation at 74 bpm with her previously noted chronic right bundle branch lock and T-wave abnormalities.  May 2017 ECG (independently read by me): Atrial fibrillation at 75 bpm with right bundle branch block and repolarization changes  November 2016 ECG (independently read by me):  Atrial fibrillation with a controlled ventricular response at 69 bpm. Right bundle branch block.  May 2016 ECG (independently read by me):  Atrial fibrillation at 75 bpm.  Right bundle branch block with repolarization changes.  December 2015ECG (independently read by me) atrial fibrillation with a ventricular rate at 10 2 bpm.  Right bundle-branch block with repolarization changes.  Nonspecific ST changes.  09/04/2014 ECG (independently read by me): atrial fibrillation with ventricular response at 1 20 bpm with right bundle branch block, repolarization changes.  04/25/2014 ECG (independently read by me): Normal sinus rhythm at 63 beats per minute.  Recommend followup with repolarization changes.  Prior April 2015 ECG: Normal sinus rhythm with right bundle branch block and repolarization changes.  Prior 10/04/2013 ECG: Atrial fibrillation at a rate At approximately 100-120 beats per minute. Nonspecific ST changes.  LABS: I reviewed lab work done by Dr. Edwyna Ready call on general 26 2018.   Total cholesterol 128,  HDL 57, triglycerides 85, LDL 54. She had been on steroids, her glucose was elevated at 151.  Her hemoglobin A1c had increased to 6.7.  BMP Latest Ref Rng & Units 11/06/2016 11/05/2016 11/04/2016  Glucose 65 - 99 mg/dL 245(H) 192(H) 102(H)  BUN 6 - 20 mg/dL 15 17 9   Creatinine 0.44 - 1.00 mg/dL 0.52 0.51 0.67  Sodium 135 - 145 mmol/L 135 135 139  Potassium 3.5 - 5.1 mmol/L 3.9 3.9 4.1  Chloride 101 - 111 mmol/L 96(L) 98(L) 101  CO2 22 - 32 mmol/L 34(H) 31 28  Calcium 8.9 - 10.3 mg/dL 9.3 8.9 9.4   Hepatic Function Latest Ref Rng & Units 11/05/2016 09/17/2016 08/14/2016  Total Protein 6.5 - 8.1 g/dL 6.4(L) 7.8 6.8  Albumin 3.5 - 5.0 g/dL 3.4(L) 4.5 3.9  AST 15 - 41 U/L 15 24 19   ALT 14 - 54 U/L 14 15 14   Alk Phosphatase 38 - 126 U/L 55 66 50  Total Bilirubin 0.3 - 1.2 mg/dL 0.5 1.3(H) 0.6  Bilirubin, Direct 0.0 - 0.5 mg/dL - - -   CBC Latest Ref Rng & Units 11/06/2016 11/05/2016 11/04/2016  WBC 4.0 - 10.5 K/uL 9.6 7.3 9.4  Hemoglobin 12.0 - 15.0 g/dL 10.4(L) 10.7(L) 9.9(L)  Hematocrit 36.0 - 46.0 % 31.4(L) 31.1(L) 30.8(L)  Platelets 150 - 400 K/uL 213 199 211   Lab Results  Component Value Date   MCV 89.5 11/06/2016   MCV 90.4 11/05/2016   MCV 92.2 11/04/2016   Lab Results  Component Value Date   TSH 0.698 05/15/2014    Lab Results  Component Value Date   HGBA1C 8.6 (H) 01/25/2015   Lipid Panel     Component Value Date/Time   CHOL 186 09/28/2014 1115   TRIG 144 09/28/2014 1115   HDL 55 09/28/2014 1115   CHOLHDL 3.4 09/28/2014 1115   VLDL 29 09/28/2014 1115   LDLCALC 102 (H) 09/28/2014 1115   LDLDIRECT 66 04/14/2012 1200    RADIOLOGY: No results found.  IMPRESSION:  1. Coronary artery disease due to lipid rich plaque   2. Permanent atrial fibrillation ([place])   3. Essential hypertension   4. Left carotid bruit   5. Non-restorative sleep   6. Snoring   7. Bruit   8. Anticoagulation adequate     ASSESSMENT AND PLAN: Ms. Hine is a  66 year old Caucasian female who has permanent atrial fibrillation and continues to be on Pradaxa for anticoagulation. She has a history of significant prior tobacco use, but fortunately she has quit smoking.  On 01/09/2014 she underwent CABG surgery for severe multivessel CAD, including left main stenosis.  At that time, she did have a left atrial clip.  Preoperatively, her ejection fraction was 30%.  Postoperatively, her ejection fraction had improved to 55-60%.  He denies any episodes of chest pain or anginal symptomatology.  Following her surgery, she initially was not restarted on Pradaxa since she was maintaining sinus rhythm.   She developed recurrent AF has since been on chronic Pradaxa 150 mg twice a day.  Her last echo Doppler study continues to show normal systolic function.  Her blood pressure today is stable on metoprolol 100 mg in the morning and 75 Mill grams at night, lisinopril 2.5 mg, furosemide 40 g daily.  She also is on digoxin 0.125 mg to help with rate control of her atrial fibrillation.  She continues to take grams for hyperlipidemia with target LDL less than 70 in this diabetic female.  She has  COPD and continues to be on Symbicort.  She is diabetic on glipizide, Januvia, and metformin.  She has carotid bruits.  I will schedule her for carotid duplex imaging.  She also has significant fatigability with daytime sleepiness, 3 times nocturia and very poor sleep.  I recommended that she undergo a sleep study for further evaluation of potential sleep apnea particularly with cardiovascular comorbidities and atrial fibrillation.  She will follow-up appointment and laboratory with Dr. Wende Neighbors.  I will see her in 6 months for reevaluation or sooner if problems arise.   Time spent: 25 minutes  Troy Sine, MD, [facility]  01/20/2017 6:33 PM

## 2017-01-21 ENCOUNTER — Other Ambulatory Visit: Payer: Self-pay | Admitting: Cardiovascular Disease

## 2017-01-21 ENCOUNTER — Telehealth: Payer: Self-pay | Admitting: Cardiovascular Disease

## 2017-01-21 NOTE — Telephone Encounter (Signed)
SPOKE TO PATIENT SLEEP STUDY  AT [facility]  - CAN NOT HAVE AT [facility] FOR Mar 12 2017   SCHEDULER WILL PATIENT INFORMATION AND DIRECTION  PATIENT AWARE OF BOTH APPOINTMENT CAROTID , SLEEP

## 2017-01-21 NOTE — Telephone Encounter (Signed)
New message    Pt is calling about her scheduled sleep study. She is asking about moving the date.

## 2017-01-22 NOTE — Telephone Encounter (Signed)
Rx request sent to pharmacy.  

## 2017-02-04 ENCOUNTER — Encounter (HOSPITAL_BASED_OUTPATIENT_CLINIC_OR_DEPARTMENT_OTHER): Payer: Medicare Other

## 2017-02-05 ENCOUNTER — Ambulatory Visit (HOSPITAL_COMMUNITY)
Admission: RE | Admit: 2017-02-05 | Discharge: 2017-02-05 | Disposition: A | Discharge status: Home / Self Care | Payer: Medicare Other | Source: Ambulatory Visit | Attending: Cardiology | Admitting: Cardiology

## 2017-02-05 DIAGNOSIS — R0989 Other specified symptoms and signs involving the circulatory and respiratory systems: Secondary | ICD-10-CM | POA: Diagnosis present

## 2017-02-05 DIAGNOSIS — I6523 Occlusion and stenosis of bilateral carotid arteries: Secondary | ICD-10-CM | POA: Diagnosis not present

## 2017-02-17 DIAGNOSIS — I10 Essential (primary) hypertension: Secondary | ICD-10-CM | POA: Diagnosis not present

## 2017-02-17 DIAGNOSIS — E1140 Type 2 diabetes mellitus with diabetic neuropathy, unspecified: Secondary | ICD-10-CM | POA: Diagnosis not present

## 2017-02-18 ENCOUNTER — Encounter (HOSPITAL_COMMUNITY): Payer: Self-pay | Admitting: Emergency Medicine

## 2017-02-18 ENCOUNTER — Emergency Department (HOSPITAL_COMMUNITY): Payer: Medicare Other

## 2017-02-18 ENCOUNTER — Inpatient Hospital Stay (HOSPITAL_COMMUNITY)
Admission: EM | Admit: 2017-02-18 | Discharge: 2017-02-24 | DRG: 189 | Disposition: A | Discharge status: Home / Self Care | Payer: Medicare Other | Attending: Family Medicine | Admitting: Family Medicine

## 2017-02-18 DIAGNOSIS — Z7951 Long term (current) use of inhaled steroids: Secondary | ICD-10-CM | POA: Diagnosis not present

## 2017-02-18 DIAGNOSIS — I4891 Unspecified atrial fibrillation: Secondary | ICD-10-CM | POA: Diagnosis present

## 2017-02-18 DIAGNOSIS — E1141 Type 2 diabetes mellitus with diabetic mononeuropathy: Secondary | ICD-10-CM | POA: Diagnosis not present

## 2017-02-18 DIAGNOSIS — I252 Old myocardial infarction: Secondary | ICD-10-CM | POA: Diagnosis not present

## 2017-02-18 DIAGNOSIS — I482 Chronic atrial fibrillation: Secondary | ICD-10-CM | POA: Diagnosis present

## 2017-02-18 DIAGNOSIS — Z888 Allergy status to other drugs, medicaments and biological substances status: Secondary | ICD-10-CM | POA: Diagnosis not present

## 2017-02-18 DIAGNOSIS — R918 Other nonspecific abnormal finding of lung field: Secondary | ICD-10-CM | POA: Diagnosis not present

## 2017-02-18 DIAGNOSIS — I5030 Unspecified diastolic (congestive) heart failure: Secondary | ICD-10-CM | POA: Diagnosis present

## 2017-02-18 DIAGNOSIS — R05 Cough: Secondary | ICD-10-CM | POA: Diagnosis not present

## 2017-02-18 DIAGNOSIS — E119 Type 2 diabetes mellitus without complications: Secondary | ICD-10-CM

## 2017-02-18 DIAGNOSIS — I5032 Chronic diastolic (congestive) heart failure: Secondary | ICD-10-CM | POA: Diagnosis not present

## 2017-02-18 DIAGNOSIS — E1165 Type 2 diabetes mellitus with hyperglycemia: Secondary | ICD-10-CM | POA: Diagnosis not present

## 2017-02-18 DIAGNOSIS — Z90710 Acquired absence of both cervix and uterus: Secondary | ICD-10-CM

## 2017-02-18 DIAGNOSIS — R0902 Hypoxemia: Secondary | ICD-10-CM

## 2017-02-18 DIAGNOSIS — Z7982 Long term (current) use of aspirin: Secondary | ICD-10-CM

## 2017-02-18 DIAGNOSIS — J9621 Acute and chronic respiratory failure with hypoxia: Principal | ICD-10-CM | POA: Diagnosis present

## 2017-02-18 DIAGNOSIS — Z91040 Latex allergy status: Secondary | ICD-10-CM | POA: Diagnosis not present

## 2017-02-18 DIAGNOSIS — Z8249 Family history of ischemic heart disease and other diseases of the circulatory system: Secondary | ICD-10-CM

## 2017-02-18 DIAGNOSIS — I5042 Chronic combined systolic (congestive) and diastolic (congestive) heart failure: Secondary | ICD-10-CM | POA: Diagnosis not present

## 2017-02-18 DIAGNOSIS — I110 Hypertensive heart disease with heart failure: Secondary | ICD-10-CM | POA: Diagnosis not present

## 2017-02-18 DIAGNOSIS — Z833 Family history of diabetes mellitus: Secondary | ICD-10-CM

## 2017-02-18 DIAGNOSIS — F17210 Nicotine dependence, cigarettes, uncomplicated: Secondary | ICD-10-CM | POA: Diagnosis present

## 2017-02-18 DIAGNOSIS — J9622 Acute and chronic respiratory failure with hypercapnia: Secondary | ICD-10-CM | POA: Diagnosis present

## 2017-02-18 DIAGNOSIS — I2510 Atherosclerotic heart disease of native coronary artery without angina pectoris: Secondary | ICD-10-CM | POA: Diagnosis present

## 2017-02-18 DIAGNOSIS — J449 Chronic obstructive pulmonary disease, unspecified: Secondary | ICD-10-CM | POA: Diagnosis not present

## 2017-02-18 DIAGNOSIS — Z951 Presence of aortocoronary bypass graft: Secondary | ICD-10-CM

## 2017-02-18 DIAGNOSIS — Z91048 Other nonmedicinal substance allergy status: Secondary | ICD-10-CM | POA: Diagnosis not present

## 2017-02-18 DIAGNOSIS — E785 Hyperlipidemia, unspecified: Secondary | ICD-10-CM | POA: Diagnosis not present

## 2017-02-18 DIAGNOSIS — Z7984 Long term (current) use of oral hypoglycemic drugs: Secondary | ICD-10-CM | POA: Diagnosis not present

## 2017-02-18 DIAGNOSIS — F419 Anxiety disorder, unspecified: Secondary | ICD-10-CM | POA: Diagnosis present

## 2017-02-18 DIAGNOSIS — Z681 Body mass index (BMI) 19 or less, adult: Secondary | ICD-10-CM

## 2017-02-18 DIAGNOSIS — J441 Chronic obstructive pulmonary disease with (acute) exacerbation: Secondary | ICD-10-CM | POA: Diagnosis not present

## 2017-02-18 DIAGNOSIS — G8929 Other chronic pain: Secondary | ICD-10-CM | POA: Diagnosis not present

## 2017-02-18 DIAGNOSIS — R0602 Shortness of breath: Secondary | ICD-10-CM | POA: Diagnosis not present

## 2017-02-18 DIAGNOSIS — T380X5A Adverse effect of glucocorticoids and synthetic analogues, initial encounter: Secondary | ICD-10-CM | POA: Diagnosis present

## 2017-02-18 DIAGNOSIS — R079 Chest pain, unspecified: Secondary | ICD-10-CM | POA: Diagnosis not present

## 2017-02-18 DIAGNOSIS — I959 Hypotension, unspecified: Secondary | ICD-10-CM | POA: Diagnosis present

## 2017-02-18 DIAGNOSIS — E440 Moderate protein-calorie malnutrition: Secondary | ICD-10-CM | POA: Diagnosis not present

## 2017-02-18 DIAGNOSIS — Z87891 Personal history of nicotine dependence: Secondary | ICD-10-CM | POA: Diagnosis not present

## 2017-02-18 LAB — COMPREHENSIVE METABOLIC PANEL
ALT: 19 U/L (ref 14–54)
AST: 28 U/L (ref 15–41)
Albumin: 4.6 g/dL (ref 3.5–5.0)
Alkaline Phosphatase: 80 U/L (ref 38–126)
Anion gap: 11 (ref 5–15)
BUN: 11 mg/dL (ref 6–20)
CHLORIDE: 92 mmol/L — AB (ref 101–111)
CO2: 31 mmol/L (ref 22–32)
Calcium: 10 mg/dL (ref 8.9–10.3)
Creatinine, Ser: 0.61 mg/dL (ref 0.44–1.00)
Glucose, Bld: 175 mg/dL — ABNORMAL HIGH (ref 65–99)
POTASSIUM: 5 mmol/L (ref 3.5–5.1)
SODIUM: 134 mmol/L — AB (ref 135–145)
Total Bilirubin: 0.8 mg/dL (ref 0.3–1.2)
Total Protein: 8.6 g/dL — ABNORMAL HIGH (ref 6.5–8.1)

## 2017-02-18 LAB — CBC WITH DIFFERENTIAL/PLATELET
BASOS ABS: 0 10*3/uL (ref 0.0–0.1)
Basophils Relative: 0 %
Eosinophils Absolute: 0.1 10*3/uL (ref 0.0–0.7)
Eosinophils Relative: 0 %
HEMATOCRIT: 40.4 % (ref 36.0–46.0)
HEMOGLOBIN: 12.6 g/dL (ref 12.0–15.0)
Lymphocytes Relative: 14 %
Lymphs Abs: 2 10*3/uL (ref 0.7–4.0)
MCH: 29 pg (ref 26.0–34.0)
MCHC: 31.2 g/dL (ref 30.0–36.0)
MCV: 93.1 fL (ref 78.0–100.0)
Monocytes Absolute: 1.2 10*3/uL — ABNORMAL HIGH (ref 0.1–1.0)
Monocytes Relative: 8 %
NEUTROS ABS: 11.3 10*3/uL — AB (ref 1.7–7.7)
NEUTROS PCT: 78 %
Platelets: 267 10*3/uL (ref 150–400)
RBC: 4.34 MIL/uL (ref 3.87–5.11)
RDW: 15 % (ref 11.5–15.5)
WBC: 14.6 10*3/uL — ABNORMAL HIGH (ref 4.0–10.5)

## 2017-02-18 LAB — I-STAT CG4 LACTIC ACID, ED: LACTIC ACID, VENOUS: 1.83 mmol/L (ref 0.5–1.9)

## 2017-02-18 LAB — BLOOD GAS, ARTERIAL
ACID-BASE EXCESS: 3 mmol/L — AB (ref 0.0–2.0)
Bicarbonate: 26.5 mmol/L (ref 20.0–28.0)
DRAWN BY: 270161
Delivery systems: POSITIVE
Expiratory PAP: 8
FIO2: 50
Inspiratory PAP: 18
LHR: 8 {breaths}/min
O2 SAT: 95.8 %
PATIENT TEMPERATURE: 37
PCO2 ART: 53.4 mmHg — AB (ref 32.0–48.0)
PH ART: 7.344 — AB (ref 7.350–7.450)
PO2 ART: 87.8 mmHg (ref 83.0–108.0)

## 2017-02-18 LAB — RAPID URINE DRUG SCREEN, HOSP PERFORMED
AMPHETAMINES: NOT DETECTED
Barbiturates: NOT DETECTED
Benzodiazepines: POSITIVE — AB
Cocaine: NOT DETECTED
Opiates: NOT DETECTED
Tetrahydrocannabinol: NOT DETECTED

## 2017-02-18 LAB — GLUCOSE, CAPILLARY
GLUCOSE-CAPILLARY: 172 mg/dL — AB (ref 65–99)
Glucose-Capillary: 161 mg/dL — ABNORMAL HIGH (ref 65–99)

## 2017-02-18 LAB — DIGOXIN LEVEL: Digoxin Level: 0.5 ng/mL — ABNORMAL LOW (ref 0.8–2.0)

## 2017-02-18 LAB — I-STAT TROPONIN, ED: TROPONIN I, POC: 0 ng/mL (ref 0.00–0.08)

## 2017-02-18 LAB — PROCALCITONIN

## 2017-02-18 LAB — BRAIN NATRIURETIC PEPTIDE: B NATRIURETIC PEPTIDE 5: 452 pg/mL — AB (ref 0.0–100.0)

## 2017-02-18 LAB — MAGNESIUM: Magnesium: 1.6 mg/dL — ABNORMAL LOW (ref 1.7–2.4)

## 2017-02-18 LAB — LIPASE, BLOOD: LIPASE: 32 U/L (ref 11–51)

## 2017-02-18 LAB — PROTIME-INR
INR: 1.43
Prothrombin Time: 17.5 seconds — ABNORMAL HIGH (ref 11.4–15.2)

## 2017-02-18 LAB — MRSA PCR SCREENING: MRSA by PCR: NEGATIVE

## 2017-02-18 MED ORDER — INSULIN ASPART 100 UNIT/ML ~~LOC~~ SOLN: 0.0000 [IU] | Freq: Three times a day (TID) | SUBCUTANEOUS | Status: DC

## 2017-02-18 MED ORDER — METHYLPREDNISOLONE SODIUM SUCC 125 MG IJ SOLR
125.0000 mg | Freq: Once | INTRAMUSCULAR | Status: AC
  Administered 2017-02-18: 125 mg via INTRAVENOUS
  Filled 2017-02-18: qty 2

## 2017-02-18 MED ORDER — ONDANSETRON HCL 4 MG/2ML IJ SOLN: 4.0000 mg | Freq: Four times a day (QID) | INTRAMUSCULAR | Status: DC | PRN

## 2017-02-18 MED ORDER — INSULIN ASPART 100 UNIT/ML ~~LOC~~ SOLN: 0.0000 [IU] | Freq: Every day | SUBCUTANEOUS | Status: DC

## 2017-02-18 MED ORDER — DEXTROSE 5 % IV SOLN
500.0000 mg | INTRAVENOUS | Status: DC
  Administered 2017-02-18 – 2017-02-22 (×5): 500 mg via INTRAVENOUS
  Filled 2017-02-18 (×6): qty 500

## 2017-02-18 MED ORDER — ACETAMINOPHEN 650 MG RE SUPP: 650.0000 mg | Freq: Four times a day (QID) | RECTAL | Status: DC | PRN

## 2017-02-18 MED ORDER — IOPAMIDOL (ISOVUE-370) INJECTION 76%
100.0000 mL | Freq: Once | INTRAVENOUS | Status: AC | PRN
  Administered 2017-02-18: 100 mL via INTRAVENOUS

## 2017-02-18 MED ORDER — SODIUM CHLORIDE 0.9 % IV BOLUS (SEPSIS)
500.0000 mL | Freq: Once | INTRAVENOUS | Status: AC
  Administered 2017-02-18: 500 mL via INTRAVENOUS

## 2017-02-18 MED ORDER — IPRATROPIUM-ALBUTEROL 0.5-2.5 (3) MG/3ML IN SOLN
3.0000 mL | Freq: Four times a day (QID) | RESPIRATORY_TRACT | Status: DC
  Administered 2017-02-18 – 2017-02-19 (×3): 3 mL via RESPIRATORY_TRACT
  Filled 2017-02-18 (×3): qty 3

## 2017-02-18 MED ORDER — LACTATED RINGERS IV SOLN
INTRAVENOUS | Status: DC
  Administered 2017-02-18: 19:00:00 via INTRAVENOUS

## 2017-02-18 MED ORDER — METHYLPREDNISOLONE SODIUM SUCC 125 MG IJ SOLR
80.0000 mg | Freq: Two times a day (BID) | INTRAMUSCULAR | Status: DC
  Administered 2017-02-18 – 2017-02-20 (×5): 80 mg via INTRAVENOUS
  Filled 2017-02-18 (×5): qty 2

## 2017-02-18 MED ORDER — DABIGATRAN ETEXILATE MESYLATE 150 MG PO CAPS
150.0000 mg | ORAL_CAPSULE | Freq: Two times a day (BID) | ORAL | Status: DC
  Administered 2017-02-18 – 2017-02-24 (×12): 150 mg via ORAL
  Filled 2017-02-18 (×16): qty 1

## 2017-02-18 MED ORDER — MAGNESIUM SULFATE 2 GM/50ML IV SOLN
2.0000 g | Freq: Once | INTRAVENOUS | Status: AC
  Administered 2017-02-18: 2 g via INTRAVENOUS
  Filled 2017-02-18: qty 50

## 2017-02-18 MED ORDER — ONDANSETRON HCL 4 MG PO TABS: 4.0000 mg | ORAL_TABLET | Freq: Four times a day (QID) | ORAL | Status: DC | PRN

## 2017-02-18 MED ORDER — ASPIRIN EC 81 MG PO TBEC
81.0000 mg | DELAYED_RELEASE_TABLET | Freq: Every day | ORAL | Status: DC
  Administered 2017-02-19 – 2017-02-24 (×6): 81 mg via ORAL
  Filled 2017-02-18 (×6): qty 1

## 2017-02-18 MED ORDER — LISINOPRIL 5 MG PO TABS
2.5000 mg | ORAL_TABLET | Freq: Every day | ORAL | Status: DC
  Administered 2017-02-19 – 2017-02-24 (×6): 2.5 mg via ORAL
  Filled 2017-02-18 (×6): qty 1

## 2017-02-18 MED ORDER — IPRATROPIUM-ALBUTEROL 0.5-2.5 (3) MG/3ML IN SOLN
3.0000 mL | Freq: Once | RESPIRATORY_TRACT | Status: AC
  Administered 2017-02-18: 3 mL via RESPIRATORY_TRACT
  Filled 2017-02-18: qty 3

## 2017-02-18 MED ORDER — METOPROLOL TARTRATE 25 MG PO TABS
75.0000 mg | ORAL_TABLET | Freq: Every day | ORAL | Status: DC
  Administered 2017-02-18 – 2017-02-23 (×5): 75 mg via ORAL
  Filled 2017-02-18 (×5): qty 1

## 2017-02-18 MED ORDER — INSULIN ASPART 100 UNIT/ML ~~LOC~~ SOLN
0.0000 [IU] | SUBCUTANEOUS | Status: DC
  Administered 2017-02-18: 3 [IU] via SUBCUTANEOUS
  Administered 2017-02-19: 5 [IU] via SUBCUTANEOUS
  Administered 2017-02-19 (×2): 3 [IU] via SUBCUTANEOUS
  Administered 2017-02-19: 2 [IU] via SUBCUTANEOUS
  Administered 2017-02-20: 11 [IU] via SUBCUTANEOUS

## 2017-02-18 MED ORDER — DIGOXIN 125 MCG PO TABS
125.0000 ug | ORAL_TABLET | Freq: Every day | ORAL | Status: DC
  Administered 2017-02-19 – 2017-02-24 (×6): 125 ug via ORAL
  Filled 2017-02-18 (×6): qty 1

## 2017-02-18 MED ORDER — DILTIAZEM LOAD VIA INFUSION
10.0000 mg | Freq: Once | INTRAVENOUS | Status: AC
  Administered 2017-02-18: 10 mg via INTRAVENOUS
  Filled 2017-02-18: qty 10

## 2017-02-18 MED ORDER — ACETAMINOPHEN 325 MG PO TABS
650.0000 mg | ORAL_TABLET | Freq: Four times a day (QID) | ORAL | Status: DC | PRN
  Administered 2017-02-19: 650 mg via ORAL
  Filled 2017-02-18: qty 2

## 2017-02-18 MED ORDER — METOPROLOL TARTRATE 50 MG PO TABS
100.0000 mg | ORAL_TABLET | Freq: Every day | ORAL | Status: DC
  Administered 2017-02-19 – 2017-02-24 (×5): 100 mg via ORAL
  Filled 2017-02-18 (×7): qty 2

## 2017-02-18 MED ORDER — ALBUTEROL SULFATE (2.5 MG/3ML) 0.083% IN NEBU: 2.5000 mg | INHALATION_SOLUTION | RESPIRATORY_TRACT | Status: DC | PRN

## 2017-02-18 MED ORDER — GABAPENTIN 100 MG PO CAPS
100.0000 mg | ORAL_CAPSULE | Freq: Three times a day (TID) | ORAL | Status: DC
  Administered 2017-02-18 – 2017-02-24 (×18): 100 mg via ORAL
  Filled 2017-02-18 (×18): qty 1

## 2017-02-18 MED ORDER — METOPROLOL TARTRATE 50 MG PO TABS: 50.0000 mg | ORAL_TABLET | Freq: Two times a day (BID) | ORAL | Status: DC

## 2017-02-18 MED ORDER — MONTELUKAST SODIUM 10 MG PO TABS
10.0000 mg | ORAL_TABLET | Freq: Every day | ORAL | Status: DC
  Administered 2017-02-19 – 2017-02-24 (×6): 10 mg via ORAL
  Filled 2017-02-18 (×6): qty 1

## 2017-02-18 MED ORDER — DILTIAZEM HCL-DEXTROSE 100-5 MG/100ML-% IV SOLN (PREMIX)
5.0000 mg/h | INTRAVENOUS | Status: DC
Start: 2017-02-18 — End: 2017-02-20
  Administered 2017-02-18: 7.5 mg/h via INTRAVENOUS
  Administered 2017-02-18: 10 mg/h via INTRAVENOUS
  Administered 2017-02-18: 5 mg/h via INTRAVENOUS
  Filled 2017-02-18: qty 100

## 2017-02-18 MED ORDER — ATORVASTATIN CALCIUM 40 MG PO TABS
40.0000 mg | ORAL_TABLET | Freq: Every day | ORAL | Status: DC
  Administered 2017-02-19 – 2017-02-24 (×6): 40 mg via ORAL
  Filled 2017-02-18 (×6): qty 1

## 2017-02-18 NOTE — ED Notes (Signed)
Error in hanging Cardizem.  No medication was administer through IV. Bag full

## 2017-02-18 NOTE — ED Notes (Signed)
Pt assisted with a female urinal at this time unable to get sample.

## 2017-02-18 NOTE — H&P (Signed)
History and Physical    [name]:[id number] DOB: 03/28/1951 DOA: 02/18/2017  PCP: Wende Neighbors, MD Consultants:  Cardiology - [facility] Patient coming from: Home  Chief Complaint: SOB  HPI: Mary [name] is a 66 y.o. female with medical history significant of COPD not apparently on home O2; chronic systolic HF (EF 62-83% in 2015), afib on Pradaxa; DM; and anxiety presenting to the hospital with worsening SOB.  Symptoms started last Friday.    She was hospitalized for SOB from 1/10-12 for apparent COPD exacerbation and/or mild CHF exacerbation (did not require BIPAP).  She was treated with IV Lasix, steroids, and azithromycin.  She was unable to provide further history at the time of admission due to somnolence on BIPAP.  HPI per Dr. Laverta Baltimore:  Mary [name] is a 66 y.o. female with hx of COPD, diabetes, and CHF was brought in by ambulance, who presents to the Emergency Department complaining of SOB with an associated cough for the past 6 days. She has tried taking tylenol and using her inhaler at home with no significant relief. She also reports a subjective fever and chest pain recently. She states that her chest pain began to worsen today. This chest pain feels different than when she had a heart attack a few years ago. She states that she didn't experience any chest pain during her MI. She doesn't typically use oxygen at home. She reports no tobacco use in the past 3.5 years. She denies any tobacco use in her apartment. She states that some of the smoke still comes through her vents in her apartment from her neighbors. She is currently on blood thinners for A-fib. She denies nausea, vomiting, or abdominal pain.   ED Course:   Hypoxia to 60s, afib with RVR.  Given IVF, Diltiazem.  CXR negative for infiltrate or edema.  Became more dyspneic in CT so given DuoNeb and started on BIPAP.  CT appears to show findings concerning for malignancy.  Plan for admission for COPD exacerbation.  Review of Systems:  Unable to perform - patient on BIPAP  PMH, PSH, FH, SH reviewed in Epic.  Past Medical History:  Diagnosis Date  . Anxiety   . Arthritis   . Atrial fibrillation ([place])   . Bronchial asthma   . CHF (congestive heart failure) ([place])   . Chronic pain    Bacl pain, Disc L5-S1- Dr. Joya Salm in [place]  . COPD (chronic obstructive pulmonary disease) ([place])   . DDD (degenerative disc disease)    Radicular symptoms  . Diabetes mellitus   . History of stress test 06/2011   Abnormal myocardial perfusion study.  Marland Kitchen Hx of echocardiogram    Was interpreted by Dr Doylene Canard that showed an Ef in the 50-60% range with grade 1 diastolic dysfunction. she had moderate MR, biatrial enlargement, moderate TR and at that time estimated RV systolic pressure was 43 mm.  . Hyperlipidemia   . Hypertension   . Neuropathy   . Shortness of breath   . Tachycardia     Past Surgical History:  Procedure Laterality Date  . ABDOMINAL HYSTERECTOMY     partial  . Breast cyst removed    . CARPAL TUNNEL RELEASE     x 2  . CORONARY ARTERY BYPASS GRAFT N/A 01/09/2014   Procedure: CORONARY ARTERY BYPASS GRAFTING (CABG) x 3 using endoscopically harvested right saphenous vein and left internal mammary artery and closure of left atrial appendage;  Surgeon: Ivin Poot, MD;  Location: [facility]  OR;  Service: Open Heart Surgery;  Laterality: N/A;  patient has preop IA BP   . INTRAOPERATIVE TRANSESOPHAGEAL ECHOCARDIOGRAM N/A 01/09/2014   Procedure: INTRAOPERATIVE TRANSESOPHAGEAL ECHOCARDIOGRAM;  Surgeon: Ivin Poot, MD;  Location: [place];  Service: Open Heart Surgery;  Laterality: N/A;  . LEFT HEART CATHETERIZATION WITH CORONARY ANGIOGRAM N/A 01/07/2014   Procedure: LEFT HEART CATHETERIZATION WITH CORONARY ANGIOGRAM;  Surgeon: Lorretta Harp, MD;  Location: [facility] CATH LAB;  Service: Cardiovascular;  Laterality: N/A;  . TONSILLECTOMY    . TUBAL LIGATION      Social History   Social History  . Marital status: Divorced    Spouse name:  N/A  . Number of children: 1  . Years of education: 37   Occupational History  . Not on file.   Social History Main Topics  . Smoking status: Former Smoker    Packs/day: 1.50    Years: 30.00    Types: Cigarettes    Quit date: 05/26/2013  . Smokeless tobacco: Never Used     Comment: smells of heavy smoke 02/18/17  . Alcohol use No  . Drug use: No  . Sexual activity: Yes    Birth control/ protection: Surgical   Other Topics Concern  . Not on file   Social History Narrative   Lives at home with sister and sister-in-law   Caffeine use : drink 1 cup coffee in morning        Allergies  Allergen Reactions  . Adhesive [Tape] Other (See Comments)    Tears skin off.   . Latex Other (See Comments)    In tape, tears skin off.  . Zocor [Simvastatin - High Dose] Nausea And Vomiting    Family History  Problem Relation Age of Onset  . Diabetes Mother   . Heart disease Mother   . Diabetes Sister   . Hypertension Sister   . Hyperlipidemia Sister   . Diabetes Brother   . Heart disease Brother   . Diabetes Brother   . Heart disease Brother     Prior to Admission medications   Medication Sig Start Date End Date Taking? Authorizing Provider  ACCU-CHEK AVIVA PLUS test strip USE TO TEST 3 TO 4 TIMES DAILY OR AS DIRECTED. 07/20/16  Yes Alycia Rossetti, MD  acetaminophen (TYLENOL) 500 MG tablet Take 500 mg by mouth every 6 (six) hours as needed for mild pain.   Yes Historical Provider, MD  albuterol (PROAIR HFA) 108 (90 BASE) MCG/ACT inhaler Inhale 2 puffs into the lungs every 6 (six) hours as needed for wheezing or shortness of breath. 08/14/14  Yes Alycia Rossetti, MD  ALPRAZolam Duanne Moron) 1 MG tablet Take 1 mg by mouth 4 (four) times daily.  03/10/16  Yes Historical Provider, MD  aspirin EC 81 MG tablet Take 81 mg by mouth daily.   Yes Historical Provider, MD  atorvastatin (LIPITOR) 40 MG tablet Take 1 tablet (40 mg total) by mouth daily. KEEP OV. 09/21/16  Yes Troy Sine, MD    [place] 125 MCG tablet TAKE ONE TABLET BY MOUTH ONCE DAILY. 08/19/16  Yes Troy Sine, MD  fluticasone (FLONASE) 50 MCG/ACT nasal spray Place 2 sprays into both nostrils daily. Patient taking differently: Place 2 sprays into both nostrils daily as needed for allergies.  01/25/15  Yes Alycia Rossetti, MD  furosemide (LASIX) 40 MG tablet TAKE ONE TABLET BY MOUTH ONCE DAILY. 01/22/17  Yes Troy Sine, MD  gabapentin (NEURONTIN) 100 MG capsule Take  1 capsule by mouth 3 (three) times daily. 01/21/17  Yes Historical Provider, MD  glipiZIDE (GLUCOTROL XL) 2.5 MG 24 hr tablet 2.5 mg daily.  02/28/16  Yes Historical Provider, MD  JANUVIA 100 MG tablet TAKE ONE TABLET BY MOUTH DAILY. 07/19/15  Yes Alycia Rossetti, MD  lisinopril (PRINIVIL,ZESTRIL) 2.5 MG tablet Take 1 tablet (2.5 mg total) by mouth daily. 04/15/16  Yes Troy Sine, MD  Magnesium Oxide 250 MG TABS Take 250 mg by mouth daily.    Yes Historical Provider, MD  metFORMIN (GLUCOPHAGE) 850 MG tablet TAKE 1 TABLET BY MOUTH TWICE DAILY WITH MEALS. 07/19/15  Yes Alycia Rossetti, MD  metoprolol (LOPRESSOR) 50 MG tablet Take 2 tablets in the morning and 1 1/2 tablets at night 07/22/16  Yes Troy Sine, MD  montelukast (SINGULAIR) 10 MG tablet Take 1 tablet by mouth daily. 01/21/17  Yes Historical Provider, MD  Multiple Vitamin (MULTIVITAMIN) capsule Take 1 capsule by mouth daily.   Yes Historical Provider, MD  potassium chloride SA (K-DUR,KLOR-CON) 20 MEQ tablet Take 1 tablet (20 mEq total) by mouth daily. 11/06/16  Yes Thurnell Lose, MD  PRADAXA 150 MG CAPS capsule TAKE ONE CAPSULE BY MOUTH TWICE DAILY. 04/24/16  Yes Troy Sine, MD  sodium chloride (OCEAN) 0.65 % nasal spray Place 1 spray into the nose as needed for congestion. Congestion   Yes Historical Provider, MD  SYMBICORT 160-4.5 MCG/ACT inhaler INHALE 2 PUFFS INTO THE LUNGS TWICE DAILY. RINSE MOUTH AFTER USE. 06/04/15  Yes Alycia Rossetti, MD    Physical Exam: Vitals:   02/18/17 1800  02/18/17 1822 02/18/17 1831 02/18/17 2006  BP: (!) 145/100 (!) 142/80    Pulse: 99 97  (!) 104  Resp: (!) 21   18  Temp:  97.7 F (36.5 C) 98.4 F (36.9 C)   TempSrc:  Oral Oral   SpO2: 96% 99%  98%  Weight:  60 kg (132 lb 4.4 oz)    Height:  5\' 7"  (1.702 m)       General:Somnolent but arouses easily and answers limited questions before going back to sleep Eyes:  PERRL, EOMI, normal lids, iris ENT:  grossly normal hearing, lips & tongue, mmm Neck:  no LAD, masses or thyromegaly Cardiovascular:  Irregularly irregular, with tachycardia, no m/r/g. No LE edema.  Respiratory: Mild diffuse wheezing. Normal respiratory effort on BIPAP. Abdomen:  soft, ntnd, NABS Skin:  no rash or induration seen on limited exam Musculoskeletal:  grossly normal tone BUE/BLE, good ROM, no bony abnormality Psychiatric:  grossly normal mood and affect, speech fluent and appropriate, AOx3 Neurologic:  CN 2-12 grossly intact, moves all extremities in coordinated fashion, sensation intact  Labs on Admission: I have personally reviewed following labs and imaging studies  CBC:  Recent Labs Lab 02/18/17 1106  WBC 14.6*  NEUTROABS 11.3*  HGB 12.6  HCT 40.4  MCV 93.1  PLT 409   Basic Metabolic Panel:  Recent Labs Lab 02/18/17 1106 02/18/17 1111  NA 134*  --   K 5.0  --   CL 92*  --   CO2 31  --   GLUCOSE 175*  --   BUN 11  --   CREATININE 0.61  --   CALCIUM 10.0  --   MG  --  1.6*   GFR: Estimated Creatinine Clearance: 64.6 mL/min (by C-G formula based on SCr of 0.61 mg/dL). Liver Function Tests:  Recent Labs Lab 02/18/17 1106  AST 28  ALT  19  ALKPHOS 80  BILITOT 0.8  PROT 8.6*  ALBUMIN 4.6    Recent Labs Lab 02/18/17 1106  LIPASE 32   No results for input(s): AMMONIA in the last 168 hours. Coagulation Profile:  Recent Labs Lab 02/18/17 1106  INR 1.43   Cardiac Enzymes: No results for input(s): CKTOTAL, CKMB, CKMBINDEX, TROPONINI in the last 168 hours. BNP (last 3  results) No results for input(s): PROBNP in the last 8760 hours. HbA1C: No results for input(s): HGBA1C in the last 72 hours. CBG:  Recent Labs Lab 02/18/17 1824 02/18/17 1947  GLUCAP 172* 161*   Lipid Profile: No results for input(s): CHOL, HDL, LDLCALC, TRIG, CHOLHDL, LDLDIRECT in the last 72 hours. Thyroid Function Tests: No results for input(s): TSH, T4TOTAL, FREET4, T3FREE, THYROIDAB in the last 72 hours. Anemia Panel: No results for input(s): VITAMINB12, FOLATE, FERRITIN, TIBC, IRON, RETICCTPCT in the last 72 hours. Urine analysis:    Component Value Date/Time   COLORURINE YELLOW 08/14/2016 [place] 08/14/2016 1343   LABSPEC 1.010 08/14/2016 1343   PHURINE 7.0 08/14/2016 1343   GLUCOSEU NEGATIVE 08/14/2016 1343   HGBUR NEGATIVE 08/14/2016 1343   BILIRUBINUR NEGATIVE 08/14/2016 1343   KETONESUR NEGATIVE 08/14/2016 1343   PROTEINUR NEGATIVE 08/14/2016 1343   UROBILINOGEN 0.2 02/17/2015 2040   NITRITE NEGATIVE 08/14/2016 1343   LEUKOCYTESUR NEGATIVE 08/14/2016 1343    Creatinine Clearance: Estimated Creatinine Clearance: 64.6 mL/min (by C-G formula based on SCr of 0.61 mg/dL).  Sepsis Labs: @LABRCNTIP (procalcitonin:4,lacticidven:4) ) Recent Results (from the past 240 hour(s))  MRSA PCR Screening     Status: None   Collection Time: 02/18/17  6:15 PM  Result Value Ref Range Status   MRSA by PCR NEGATIVE NEGATIVE Final    Comment:        The GeneXpert MRSA Assay (FDA approved for NASAL specimens only), is one component of a comprehensive MRSA colonization surveillance program. It is not intended to diagnose MRSA infection nor to guide or monitor treatment for MRSA infections.      Radiological Exams on Admission: Ct Angio Chest Pe W And/or Wo Contrast  Result Date: 02/18/2017 CLINICAL DATA:  Chest pain and shortness of breath EXAM: CT ANGIOGRAPHY CHEST WITH CONTRAST TECHNIQUE: Multidetector CT imaging of the chest was performed using the  standard protocol during bolus administration of intravenous contrast. Multiplanar CT image reconstructions and MIPs were obtained to evaluate the vascular anatomy. CONTRAST:  100 mL Isovue 370 nonionic COMPARISON:  Chest radiograph February 18, 2017 FINDINGS: Cardiovascular: There is no demonstrable pulmonary embolus. There is no thoracic aortic aneurysm or dissection. There is moderate atherosclerotic calcification in the proximal visualized great vessels. There is extensive calcification in the aorta. There are multiple foci of native coronary artery calcification. The patient is status post coronary artery bypass grafting. Pericardium is not appreciably thickened. Mediastinum/Nodes: Thyroid appears normal. There are multiple subcentimeter mediastinal lymph nodes. There is an enlarged lymph node anterior to the carina measuring 2.3 x 1.8 cm. There is sub- carinal adenopathy measuring 3.2 x 2.2 cm. There is an enlarged lymph node in the right hilum measuring 1.7 x 1.1 cm. There is a small hiatal hernia. Lungs/Pleura: On axial slice 52 series 8, there is a 5 mm nodular opacity in the anterior segment of the left upper lobe. There is a somewhat irregular masslike area in the superior segment right lower lobe measuring 1.3 x 1.1 x 0.9 cm, best seen on axial slice 57 series 8 and coronal slice 016  series 10. There is a nodular opacity in the posterior segment of the right upper lobe on axial slice 59 series 8 measuring 4 mm. There is a nodular lesion in the superior segment right lower lobe seen on axial slice 64 series 8 measuring 8 x 5 mm. There is a nodular lesion also in the superior segment of the right lower lobe on axial slice 63 series 8 measuring 0.9 x 0.8 cm. There is a 3 mm nodular opacity in the lateral segment of the left lower lobe on axial slice 63 series 8. There is a 5 mm nodular opacity in the posterior aspect of the superior segment of the left lower lobe seen on axial slice 67 series 8. There are  multiple scattered 1-2 mm nodular opacities in the upper lobes bilaterally. There is atelectatic change in the anterior lung bases bilaterally. There is no frank airspace consolidation. No pleural effusion or pleural thickening. Upper Abdomen: There is reflux of contrast into the inferior vena cava and hepatic veins. Visualized upper abdominal structures otherwise appear unremarkable. Musculoskeletal: Patient is status post median sternotomy. There are no blastic or lytic bone lesions. Review of the MIP images confirms the above findings. IMPRESSION: No demonstrable pulmonary embolus. Reflux of contrast into the inferior vena cava and hepatic veins may well represent a degree of increased right heart pressure. There are areas of adenopathy, most pronounced in the precarinal and sub- carinal regions. There are nodular opacities in the lungs, largest measuring 1.3 x 1.1 x 0.9 cm in the superior segment right lower lobe. This combination of findings is concerning for neoplasm. Advise PET-CT to further evaluate. No frank edema or consolidation. Extensive atherosclerotic calcification. Patient is status post coronary artery bypass grafting. Electronically Signed   By: Lowella Grip III M.D.   On: 02/18/2017 15:19   Dg Chest Portable 1 View  Result Date: 02/18/2017 CLINICAL DATA:  66 year old presenting with a 6 day history of productive cough, chest congestion and shortness of breath. Current history of COPD. Prior CABG. Hypoxia noted while in the emergency department. EXAM: PORTABLE CHEST 1 VIEW COMPARISON:  11/04/2016, 09/17/2016 and earlier. FINDINGS: Sternotomy for CABG. Cardiac silhouette upper normal in size for AP portable technique, unchanged. Left atrial appendage clip. Mildly prominent bronchovascular markings diffusely, unchanged. Lungs otherwise clear. No localized airspace consolidation. No pleural effusions. No pneumothorax. Normal pulmonary vascularity. IMPRESSION: No acute cardiopulmonary disease.  Electronically Signed   By: Evangeline Dakin M.D.   On: 02/18/2017 11:35    EKG: Independently reviewed.  Afib with rate 121; RBBB and LPFB noted with no evidence of acute ischemia  Assessment/Plan Principal Problem:   Acute on chronic respiratory failure with hypoxia (HCC) Active Problems:   Diabetes mellitus (HCC)   Diastolic congestive heart failure (HCC)   Atrial fibrillation with rapid ventricular response (HCC)   COPD with acute exacerbation (HCC)   Hypomagnesemia   Acute on chronic respiratory failure due to COPD exacerbation -Patient had O2 sats in the 60s upon arrival in the ER -Based on persistently increased WOB she was placed on BIPAP with good results -CT is negative for infiltrate or PE but does have findings concerning for neoplasm; she will certainly need outpatient f/u for this with a PET/CT -She does have a h/o CHF (minimally abnormal echo in 3/17) but does not have apparent pulmonary edema on CXR/CT and her BNP (452) is essentially unchanged vs. Slightly better than prior.  -WBC 14.6 -Lactate 1.83 -will admit patient to SDU telemetry bed  -  Continue BIPAP for now -Will check ABG to ensure that BIPAP settings are appropriate given patient's ongoing somnolence -Nebulizers: scheduled Duoneb and prn albuterol -Solu-Medrol 80 mg IV BID -IV azithromycin for 5 days.  Will check procalcitonin per ICU-algorithm and if elevated will escalate antibiotics.  -Urine drug screen, HIV  Afib with RVR -Patient with known and chronic afib, on Pradaxa due to CHA2DS2-VASc score of 4 (4% stroke rate/year). -Will admit to SDU for Diltiazem drip as per protocol with plan to delay transition to PO Diltiazem until respiratory status is improved.   -Patient is on Pradaxa at home, will continue.  DM -Glucose 175, 172, 161 -Hold oral hypoglycemics -A1c pending, the last was 8.6 in 4/16 -Will cover with SSI  Hypomagnesemia -Magnesium 1.6 -Will replete with 2 gram bolus of mag, which  also may improve breathing   DVT prophylaxis: Pradaxa Code Status:  Full - confirmed with patient Family Communication: None present Disposition Plan:  Home once clinically improved Consults called: None  Admission status: Admit - It is my clinical opinion that admission to [facility] is reasonable and necessary because this patient will require at least 2 midnights in the hospital to treat this condition based on the medical complexity of the problems presented.  Given the aforementioned information, the predictability of an adverse outcome is felt to be significant.  Total critical care time: 65 minutes Critical care time was exclusive of separately billable procedures and treating other patients. Critical care was necessary to treat or prevent imminent or life-threatening deterioration. Critical care was time spent personally by me on the following activities: development of treatment plan with patient and/or surrogate as well as nursing, discussions with consultants, evaluation of patient's response to treatment, examination of patient, obtaining history from patient or surrogate, ordering and performing treatments and interventions, ordering and review of laboratory studies, ordering and review of radiographic studies, pulse oximetry and re-evaluation of patient's condition.   Karmen Bongo MD [facility]  If 7PM-7AM, please contact night-coverage [email or web address] Password [facility]  02/18/2017, 8:32 PM

## 2017-02-18 NOTE — ED Triage Notes (Signed)
Pt reports sob with cough since Friday.  States she has not used neb today.  White sputum.

## 2017-02-18 NOTE — ED Provider Notes (Signed)
Emergency Department Provider Note  By signing my name below, I, Hilbert Odor, attest that this documentation has been prepared under the direction and in the presence of Margette Fast, MD. Electronically Signed: Hilbert Odor, Scribe. 02/18/17. 11:26 AM.   I have reviewed the triage vital signs and the nursing notes.   HISTORY  Chief Complaint Shortness of Breath   HPI Comments: Mary [name] is a 66 y.o. female with hx of COPD, diabetes, and CHF was brought in by ambulance, who presents to the Emergency Department complaining of SOB with an associated cough for the past 6 days. She has tried taking tylenol and using her inhaler at home with no significant relief. She also reports a subjective fever and chest pain recently. She states that her chest pain began to worsen today. This chest pain feels different than when she had a heart attack a few years ago. She states that she didn't experience any chest pain during her MI. She doesn't typically use oxygen at home. She reports no tobacco use in the past 3.5 years. She denies any tobacco use in her apartment. She states that some of the smoke still comes through her vents in her apartment from her neighbors. She is currently on blood thinners for A-fib. She denies nausea, vomiting, or abdominal pain.  Past Medical History:  Diagnosis Date  . Anxiety   . Arthritis   . Atrial fibrillation ([place])   . Bronchial asthma   . CHF (congestive heart failure) ([place])   . Chronic pain    Bacl pain, Disc L5-S1- Dr. Joya Salm in [place]  . COPD (chronic obstructive pulmonary disease) ([place])   . DDD (degenerative disc disease)    Radicular symptoms  . Diabetes mellitus   . History of stress test 06/2011   Abnormal myocardial perfusion study.  Marland Kitchen Hx of echocardiogram    Was interpreted by Dr Doylene Canard that showed an Ef in the 50-60% range with grade 1 diastolic dysfunction. she had moderate MR, biatrial enlargement, moderate TR and at that time  estimated RV systolic pressure was 43 mm.  . Hyperlipidemia   . Hypertension   . Neuropathy   . Shortness of breath   . Tachycardia     Patient Active Problem List   Diagnosis Date Noted  . Acute on chronic respiratory failure with hypoxia ([place]) 02/18/2017  . COPD with acute exacerbation ([place]) 11/04/2016  . Left carotid bruit 09/13/2015  . Noncompliance with medications 03/19/2015  . Gastroenteritis 03/19/2015  . Seasonal allergies 01/25/2015  . Chest pain 05/16/2014  . HTN (hypertension) 05/16/2014  . PSVT (paroxysmal supraventricular tachycardia) ([place]) 05/16/2014  . Acute diastolic CHF (congestive heart failure) ([place]) 05/15/2014  . CAD (coronary artery disease) 05/15/2014  . RBBB 04/25/2014  . Benign skin lesion 03/07/2014  . 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 Atrial fibrillation with rapid ventricular response ([place]) 01/06/2014  . Leukocytosis 01/06/2014  . Acute diastolic heart failure ([place]) 01/06/2014  . Fall at home 09/18/2013  . Recurrent falls 09/18/2013  . Tinnitus of left ear 02/19/2013  . Diastolic congestive heart failure ([place]) 09/10/2012  . Atrial fibrillation ([place]) 09/10/2012  . DDD (degenerative disc disease), lumbar 08/26/2012  . Atypical mole 04/14/2012  . Seborrheic keratosis 04/14/2012  . Depression 12/09/2011  . COPD (chronic obstructive pulmonary disease) ([place]) 06/27/2011  . Anxiety 06/27/2011  . Chronic pain 05/27/2011  . Diabetes mellitus ([place]) 05/26/2011  . Hyperlipidemia 05/26/2011    Past Surgical History:  Procedure Laterality Date  .  ABDOMINAL HYSTERECTOMY     partial  . Breast cyst removed    . CARPAL TUNNEL RELEASE     x 2  . CORONARY ARTERY BYPASS GRAFT N/A 01/09/2014   Procedure: CORONARY ARTERY BYPASS GRAFTING (CABG) x 3 using endoscopically harvested right saphenous vein and left internal mammary artery and closure of left atrial appendage;  Surgeon: Ivin Poot, MD;  Location: [facility];  Service: Open Heart Surgery;  Laterality: N/A;   patient has preop IA BP   . INTRAOPERATIVE TRANSESOPHAGEAL ECHOCARDIOGRAM N/A 01/09/2014   Procedure: INTRAOPERATIVE TRANSESOPHAGEAL ECHOCARDIOGRAM;  Surgeon: Ivin Poot, MD;  Location: [facility];  Service: Open Heart Surgery;  Laterality: N/A;  . LEFT HEART CATHETERIZATION WITH CORONARY ANGIOGRAM N/A 01/07/2014   Procedure: LEFT HEART CATHETERIZATION WITH CORONARY ANGIOGRAM;  Surgeon: Lorretta Harp, MD;  Location: [facility] CATH LAB;  Service: Cardiovascular;  Laterality: N/A;  . TONSILLECTOMY    . TUBAL LIGATION      Current Outpatient Rx  . Order #: [id number] Class: Normal  . Order #: [id number] Class: Historical Med  . Order #: [id number] Class: Normal  . Order #: [id number] Class: Historical Med  . Order #: [id number] Class: Historical Med  . Order #: [id number] Class: Normal  . Order #: [id number] Class: Normal  . Order #: [id number] Class: Normal  . Order #: [id number] Class: Normal  . Order #: [id number] Class: Historical Med  . Order #: [id number] Class: Historical Med  . Order #: [id number] Class: Normal  . Order #: [id number] Class: Normal  . Order #: [id number] Class: Historical Med  . Order #: [id number] Class: Normal  . Order #: [id number] Class: Normal  . Order #: [id number] Class: Historical Med  . Order #: [id number] Class: Historical Med  . Order #: [id number] Class: Print  . Order #: [id number] Class: Normal  . Order #: [id number] Class: Historical Med  . Order #: [id number] Class: Normal    Allergies Adhesive [tape]; Latex; and Zocor [simvastatin - high dose]  Family History  Problem Relation Age of Onset  . Diabetes Mother   . Heart disease Mother   . Diabetes Sister   . Hypertension Sister   . Hyperlipidemia Sister   . Diabetes Brother   . Heart disease Brother   . Diabetes Brother   . Heart disease Brother     Social History Social History  Substance Use Topics  . Smoking status: Former Smoker    Packs/day: 1.50    Years: 30.00    Types: Cigarettes    Quit date: 05/26/2013  . Smokeless  tobacco: Never Used     Comment: smells of heavy smoke 02/18/17  . Alcohol use No    Review of Systems  Constitutional: Positive for fever. Eyes: No visual changes. ENT: No sore throat. Cardiovascular: Positive for chest pain. Respiratory: Positive for shortness of breath. Gastrointestinal: No abdominal pain.  No nausea, no vomiting.  No diarrhea.  No constipation. Genitourinary: Negative for dysuria. Musculoskeletal: Negative for back pain. Skin: Negative for rash. Neurological: Negative for headaches, focal weakness or numbness.  10-point ROS otherwise negative.  ____________________________________________   PHYSICAL EXAM:  VITAL SIGNS: ED Triage Vitals  Enc Vitals Group     BP 02/18/17 1054 111/89     Pulse Rate 02/18/17 1054 (!) 141     Resp 02/18/17 1054 20     Temp 02/18/17 1054 97.7 F (36.5 C)     Temp Source 02/18/17 1054 Oral     SpO2 02/18/17 1054 (!) 69 %     Weight 02/18/17 1052 123 lb (55.8 kg)     Height  02/18/17 1052 5\' 5"  (1.651 m)     Pain Score 02/18/17 1052 8   Constitutional: Alert and oriented. Well appearing and in no acute distress. Eyes: Conjunctivae are normal. Head: Atraumatic. Nose: No congestion/rhinnorhea. Mouth/Throat: Mucous membranes are moist.  Oropharynx non-erythematous. Neck: No stridor.   Cardiovascular: A-fib RVR. Good peripheral circulation. Grossly normal heart sounds.   Respiratory: Significant increased respiratory effort.  No retractions. Lungs diminished at the bases with diffuse wheezing.  Gastrointestinal: Soft and nontender. No distention.  Musculoskeletal: No lower extremity tenderness nor edema. No gross deformities of extremities. Neurologic:  Normal speech and language. No gross focal neurologic deficits are appreciated.  Skin:  Skin is warm, dry and intact. No rash noted.  ____________________________________________   LABS (all labs ordered are listed, but only abnormal results are displayed)  Labs Reviewed   COMPREHENSIVE METABOLIC PANEL - Abnormal; Notable for the following:       Result Value   Sodium 134 (*)    Chloride 92 (*)    Glucose, Bld 175 (*)    Total Protein 8.6 (*)    All other components within normal limits  BRAIN NATRIURETIC PEPTIDE - Abnormal; Notable for the following:    B Natriuretic Peptide 452.0 (*)    All other components within normal limits  CBC WITH DIFFERENTIAL/PLATELET - Abnormal; Notable for the following:    WBC 14.6 (*)    Neutro Abs 11.3 (*)    Monocytes Absolute 1.2 (*)    All other components within normal limits  PROTIME-INR - Abnormal; Notable for the following:    Prothrombin Time 17.5 (*)    All other components within normal limits  LIPASE, BLOOD  URINALYSIS, ROUTINE W REFLEX MICROSCOPIC  I-STAT CG4 LACTIC ACID, ED  I-STAT TROPOININ, ED   ____________________________________________  EKG   EKG Interpretation  Date/Time:  Thursday February 18 2017 11:09:47 EDT Ventricular Rate:  129 PR Interval:    QRS Duration: 122 QT Interval:  315 QTC Calculation: 462 R Axis:   104 Text Interpretation:  Atrial fibrillation RBBB and LPFB No STEMI. Similar to prior.  Confirmed by Anaiah Mcmannis MD, [place] [id number]) on 02/18/2017 11:40:08 AM       ____________________________________________  RADIOLOGY  Ct Angio Chest Pe W And/or Wo Contrast  Result Date: 02/18/2017 CLINICAL DATA:  Chest pain and shortness of breath EXAM: CT ANGIOGRAPHY CHEST WITH CONTRAST TECHNIQUE: Multidetector CT imaging of the chest was performed using the standard protocol during bolus administration of intravenous contrast. Multiplanar CT image reconstructions and MIPs were obtained to evaluate the vascular anatomy. CONTRAST:  100 mL Isovue 370 nonionic COMPARISON:  Chest radiograph February 18, 2017 FINDINGS: Cardiovascular: There is no demonstrable pulmonary embolus. There is no thoracic aortic aneurysm or dissection. There is moderate atherosclerotic calcification in the proximal visualized  great vessels. There is extensive calcification in the aorta. There are multiple foci of native coronary artery calcification. The patient is status post coronary artery bypass grafting. Pericardium is not appreciably thickened. Mediastinum/Nodes: Thyroid appears normal. There are multiple subcentimeter mediastinal lymph nodes. There is an enlarged lymph node anterior to the carina measuring 2.3 x 1.8 cm. There is sub- carinal adenopathy measuring 3.2 x 2.2 cm. There is an enlarged lymph node in the right hilum measuring 1.7 x 1.1 cm. There is a small hiatal hernia. Lungs/Pleura: On axial slice 52 series 8, there is a 5 mm nodular opacity in the anterior segment of the left upper lobe. There is a somewhat irregular masslike area in the  superior segment right lower lobe measuring 1.3 x 1.1 x 0.9 cm, best seen on axial slice 57 series 8 and coronal slice 932 series 10. There is a nodular opacity in the posterior segment of the right upper lobe on axial slice 59 series 8 measuring 4 mm. There is a nodular lesion in the superior segment right lower lobe seen on axial slice 64 series 8 measuring 8 x 5 mm. There is a nodular lesion also in the superior segment of the right lower lobe on axial slice 63 series 8 measuring 0.9 x 0.8 cm. There is a 3 mm nodular opacity in the lateral segment of the left lower lobe on axial slice 63 series 8. There is a 5 mm nodular opacity in the posterior aspect of the superior segment of the left lower lobe seen on axial slice 67 series 8. There are multiple scattered 1-2 mm nodular opacities in the upper lobes bilaterally. There is atelectatic change in the anterior lung bases bilaterally. There is no frank airspace consolidation. No pleural effusion or pleural thickening. Upper Abdomen: There is reflux of contrast into the inferior vena cava and hepatic veins. Visualized upper abdominal structures otherwise appear unremarkable. Musculoskeletal: Patient is status post median sternotomy.  There are no blastic or lytic bone lesions. Review of the MIP images confirms the above findings. IMPRESSION: No demonstrable pulmonary embolus. Reflux of contrast into the inferior vena cava and hepatic veins may well represent a degree of increased right heart pressure. There are areas of adenopathy, most pronounced in the precarinal and sub- carinal regions. There are nodular opacities in the lungs, largest measuring 1.3 x 1.1 x 0.9 cm in the superior segment right lower lobe. This combination of findings is concerning for neoplasm. Advise PET-CT to further evaluate. No frank edema or consolidation. Extensive atherosclerotic calcification. Patient is status post coronary artery bypass grafting. Electronically Signed   By: Lowella Grip III M.D.   On: 02/18/2017 15:19   Dg Chest Portable 1 View  Result Date: 02/18/2017 CLINICAL DATA:  66 year old presenting with a 6 day history of productive cough, chest congestion and shortness of breath. Current history of COPD. Prior CABG. Hypoxia noted while in the emergency department. EXAM: PORTABLE CHEST 1 VIEW COMPARISON:  11/04/2016, 09/17/2016 and earlier. FINDINGS: Sternotomy for CABG. Cardiac silhouette upper normal in size for AP portable technique, unchanged. Left atrial appendage clip. Mildly prominent bronchovascular markings diffusely, unchanged. Lungs otherwise clear. No localized airspace consolidation. No pleural effusions. No pneumothorax. Normal pulmonary vascularity. IMPRESSION: No acute cardiopulmonary disease. Electronically Signed   By: Evangeline Dakin M.D.   On: 02/18/2017 11:35    ____________________________________________   PROCEDURES  Procedure(s) performed:   Procedures  CRITICAL CARE Performed by: Margette Fast Total critical care time: 60 minutes Critical care time was exclusive of separately billable procedures and treating other patients. Critical care was necessary to treat or prevent imminent or life-threatening  deterioration. Critical care was time spent personally by me on the following activities: development of treatment plan with patient and/or surrogate as well as nursing, discussions with consultants, evaluation of patient's response to treatment, examination of patient, obtaining history from patient or surrogate, ordering and performing treatments and interventions, ordering and review of laboratory studies, ordering and review of radiographic studies, pulse oximetry and re-evaluation of patient's condition.  Nanda Quinton, MD Emergency Medicine  ____________________________________________   INITIAL IMPRESSION / ASSESSMENT AND PLAN / ED COURSE  Pertinent labs & imaging results that were available during my care  of the patient were reviewed by me and considered in my medical decision making (see chart for details).  Patient presents to the ED for evaluation of dyspnea and hypoxemia and a-fib with RVR. She is hypoxic to the 60s on RA but improves with O2 supplementation. Able to provide history once on O2 with short, sentences. No fever or chills. EKG shows a-fib with RVR but no STEMI. Plan for IVF, Diltiazem, portable CXR, and labs.    CXR with no PNA or significant pulmonary edema. Plan to continue Diltiazem infusion, IVF, and send for CT. Will continue treating as if COPD in the interim.   03:10 PM Patient returned from CT scan more dyspneic. Giving additional DuoNeb and will transition to BiPAP. Sounding more crackly at the bases. Remains awake and able to provide history.   03:45 PM Patient doing much better on BiPAP. Plan for stepdown admission for further treatment of COPD exacerbation. No PE. Some findings concerning for lung masses.   Discussed patient's case with hospitalist, Dr. Lorin Mercy. Patient and family (if present) updated with plan. Care transferred to hospitalist service.  I reviewed all nursing notes, vitals, pertinent old records, EKGs, labs, imaging (as  available).  ____________________________________________  FINAL CLINICAL IMPRESSION(S) / ED DIAGNOSES  Final diagnoses:  COPD exacerbation ([place])  Hypoxia     MEDICATIONS GIVEN DURING THIS VISIT:  Medications  diltiazem (CARDIZEM) 1 mg/mL load via infusion 10 mg (10 mg Intravenous Bolus from Bag 02/18/17 1201)    And  diltiazem (CARDIZEM) 100 mg in dextrose 5% 174mL (1 mg/mL) infusion (0 mg/hr Intravenous Stopped 02/18/17 1633)  azithromycin (ZITHROMAX) 500 mg in dextrose 5 % 250 mL IVPB (500 mg Intravenous New Bag/Given 02/18/17 1633)  ipratropium-albuterol (DUONEB) 0.5-2.5 (3) MG/3ML nebulizer solution 3 mL (3 mLs Nebulization Given 02/18/17 1133)  sodium chloride 0.9 % bolus 500 mL (0 mLs Intravenous Stopped 02/18/17 1303)  sodium chloride 0.9 % bolus 500 mL (0 mLs Intravenous Stopped 02/18/17 1432)  ipratropium-albuterol (DUONEB) 0.5-2.5 (3) MG/3ML nebulizer solution 3 mL (3 mLs Nebulization Given 02/18/17 1505)  iopamidol (ISOVUE-370) 76 % injection 100 mL (100 mLs Intravenous Contrast Given 02/18/17 1449)  methylPREDNISolone sodium succinate (SOLU-MEDROL) 125 mg/2 mL injection 125 mg (125 mg Intravenous Given 02/18/17 1518)     NEW OUTPATIENT MEDICATIONS STARTED DURING THIS VISIT:  None   Note:  This document was prepared using Dragon voice recognition software and may include unintentional dictation errors.  Nanda Quinton, MD Emergency Medicine  I personally performed the services described in this documentation, which was scribed in my presence. The recorded information has been reviewed and is accurate.      Margette Fast, MD 02/18/17 [phone]

## 2017-02-18 NOTE — ED Notes (Signed)
ED Provider at bedside. 

## 2017-02-19 DIAGNOSIS — J441 Chronic obstructive pulmonary disease with (acute) exacerbation: Secondary | ICD-10-CM

## 2017-02-19 LAB — HEMOGLOBIN A1C
HEMOGLOBIN A1C: 6.9 % — AB (ref 4.8–5.6)
MEAN PLASMA GLUCOSE: 151 mg/dL

## 2017-02-19 LAB — GLUCOSE, CAPILLARY
GLUCOSE-CAPILLARY: 158 mg/dL — AB (ref 65–99)
Glucose-Capillary: 130 mg/dL — ABNORMAL HIGH (ref 65–99)
Glucose-Capillary: 133 mg/dL — ABNORMAL HIGH (ref 65–99)
Glucose-Capillary: 195 mg/dL — ABNORMAL HIGH (ref 65–99)
Glucose-Capillary: 208 mg/dL — ABNORMAL HIGH (ref 65–99)

## 2017-02-19 LAB — CBC
HEMATOCRIT: 32.6 % — AB (ref 36.0–46.0)
Hemoglobin: 10.6 g/dL — ABNORMAL LOW (ref 12.0–15.0)
MCH: 29.9 pg (ref 26.0–34.0)
MCHC: 32.5 g/dL (ref 30.0–36.0)
MCV: 92.1 fL (ref 78.0–100.0)
PLATELETS: 200 10*3/uL (ref 150–400)
RBC: 3.54 MIL/uL — ABNORMAL LOW (ref 3.87–5.11)
RDW: 14.9 % (ref 11.5–15.5)
WBC: 8.3 10*3/uL (ref 4.0–10.5)

## 2017-02-19 LAB — MAGNESIUM: MAGNESIUM: 1.2 mg/dL — AB (ref 1.7–2.4)

## 2017-02-19 LAB — BASIC METABOLIC PANEL
Anion gap: 8 (ref 5–15)
BUN: 9 mg/dL (ref 6–20)
CALCIUM: 9 mg/dL (ref 8.9–10.3)
CO2: 32 mmol/L (ref 22–32)
CREATININE: 0.42 mg/dL — AB (ref 0.44–1.00)
Chloride: 96 mmol/L — ABNORMAL LOW (ref 101–111)
GFR calc Af Amer: >60 mL/min (ref >60)
GLUCOSE: 136 mg/dL — AB (ref 65–99)
POTASSIUM: 4.4 mmol/L (ref 3.5–5.1)
SODIUM: 136 mmol/L (ref 135–145)

## 2017-02-19 LAB — PROCALCITONIN: Procalcitonin: <0.1 ng/mL

## 2017-02-19 MED ORDER — FLUTICASONE PROPIONATE 50 MCG/ACT NA SUSP: 2.0000 | Freq: Every day | NASAL | Status: DC | PRN

## 2017-02-19 MED ORDER — MOMETASONE FURO-FORMOTEROL FUM 200-5 MCG/ACT IN AERO
2.0000 | INHALATION_SPRAY | Freq: Two times a day (BID) | RESPIRATORY_TRACT | Status: DC
Start: 2017-02-19 — End: 2017-02-24
  Administered 2017-02-19 – 2017-02-24 (×11): 2 via RESPIRATORY_TRACT
  Filled 2017-02-19: qty 8.8

## 2017-02-19 MED ORDER — LEVALBUTEROL HCL 0.63 MG/3ML IN NEBU
0.6300 mg | INHALATION_SOLUTION | Freq: Four times a day (QID) | RESPIRATORY_TRACT | Status: DC
  Administered 2017-02-19 – 2017-02-21 (×8): 0.63 mg via RESPIRATORY_TRACT
  Administered 2017-02-21: 1.26 mg via RESPIRATORY_TRACT
  Administered 2017-02-22 – 2017-02-24 (×10): 0.63 mg via RESPIRATORY_TRACT
  Filled 2017-02-19 (×20): qty 3

## 2017-02-19 MED ORDER — GLUCERNA SHAKE PO LIQD
237.0000 mL | Freq: Two times a day (BID) | ORAL | Status: DC
  Administered 2017-02-19 – 2017-02-24 (×10): 237 mL via ORAL

## 2017-02-19 MED ORDER — IPRATROPIUM BROMIDE 0.02 % IN SOLN
0.5000 mg | Freq: Four times a day (QID) | RESPIRATORY_TRACT | Status: DC
  Administered 2017-02-19 – 2017-02-24 (×19): 0.5 mg via RESPIRATORY_TRACT
  Filled 2017-02-19 (×20): qty 2.5

## 2017-02-19 MED ORDER — MAGNESIUM OXIDE 400 (241.3 MG) MG PO TABS
200.0000 mg | ORAL_TABLET | Freq: Every day | ORAL | Status: DC
  Administered 2017-02-19 – 2017-02-24 (×6): 200 mg via ORAL
  Filled 2017-02-19 (×6): qty 1

## 2017-02-19 MED ORDER — DM-GUAIFENESIN ER 30-600 MG PO TB12
1.0000 | ORAL_TABLET | Freq: Two times a day (BID) | ORAL | Status: DC
  Administered 2017-02-19 – 2017-02-21 (×6): 1 via ORAL
  Filled 2017-02-19 (×7): qty 1

## 2017-02-19 MED ORDER — MAGNESIUM SULFATE 2 GM/50ML IV SOLN
4.0000 g | Freq: Once | INTRAVENOUS | Status: AC
Start: 2017-02-19 — End: 2017-02-19
  Administered 2017-02-19 (×2): 2 g via INTRAVENOUS
  Filled 2017-02-19 (×2): qty 100

## 2017-02-19 MED ORDER — ALPRAZOLAM 1 MG PO TABS
1.0000 mg | ORAL_TABLET | Freq: Four times a day (QID) | ORAL | Status: DC
  Administered 2017-02-19 – 2017-02-24 (×22): 1 mg via ORAL
  Filled 2017-02-19 (×2): qty 1
  Filled 2017-02-19: qty 4
  Filled 2017-02-19 (×2): qty 2
  Filled 2017-02-19: qty 1
  Filled 2017-02-19 (×2): qty 2
  Filled 2017-02-19: qty 1
  Filled 2017-02-19 (×2): qty 2
  Filled 2017-02-19 (×5): qty 1
  Filled 2017-02-19 (×2): qty 2
  Filled 2017-02-19 (×4): qty 1

## 2017-02-19 MED ORDER — SALINE SPRAY 0.65 % NA SOLN: 1.0000 | NASAL | Status: DC | PRN

## 2017-02-19 NOTE — Progress Notes (Signed)
Initial Nutrition Assessment  DOCUMENTATION CODES:  Non-severe (moderate) malnutrition in context of chronic illness  INTERVENTION:  Glucerna Shake po BID, each supplement provides 220 kcal and 10 grams of protein  NUTRITION DIAGNOSIS:  Malnutrition (Moderate) related to chronic illness (COPD, HF) as evidenced by moderate depletion of body fat and mild depletion of Muscle  GOAL:  Patient will meet greater than or equal to 90% of their needs  MONITOR:  PO intake, Supplement acceptance, Labs, Weight trends  REASON FOR ASSESSMENT:  Consult COPD Protocol  ASSESSMENT:  66 y/o female PMHx COPD not on O2, HF, Afib, DM, anxiety. Presented to hospital with worsening SOB x 1 week. Admitted for Acute on Chronic Resp failure 2/2 COPD exacerbation. Work up also revealed findings concerning for lung neoplasm.   Pt is very tangential and was difficult to keep her on topic. She is obviously feeling much better and seen eating lunch.   Since last Sunday, she has had severe SOB. Has had poor po intake, though did not quantify this.   She reports that up until this past Sunday, she had been at her baseline which consisted of eating 3 meals, with 2 snacks in between, of a low sodium diet. She took a multivitamin. She rarely drank nutritional beverages due to their cost. She checked her BG and, when she was well, kept them under 150. She weighed herself daily.   Per chart, the pt's weight fluctuates. She was admitted in January at weight of 122 lbs. This admission she was 123 lbs (55.91 kg), though has quickly increased likely due to IVF and HF. Wt appears overall stable for past 1 year.  She had eaten ~75% of her lunch on RD arrival. She  is agreeable to Supplementation.   Physical Exam: Mild-Moderate degree of fat loss, very noticeable on thorax. Mild muscle loss, most evident in lower body.   Labs : BG 130-170, A1c done yesterday was 6/9 Medications: Xanax, Insulin, Mag sulf/ox,  Methylprednisolone, IV abx, Lactated ringers,    Recent Labs Lab 02/18/17 1106 02/18/17 1111 02/19/17 0452  NA 134*  --  136  K 5.0  --  4.4  CL 92*  --  96*  CO2 31  --  32  BUN 11  --  9  CREATININE 0.61  --  0.42*  CALCIUM 10.0  --  9.0  MG  --  1.6* 1.2*  GLUCOSE 175*  --  136*   Diet Order:  Diet heart healthy/carb modified Room service appropriate? Yes; Fluid consistency: Thin  Skin:  Reviewed, no issues  Last BM:  Unknown  Height:  Ht Readings from Last 1 Encounters:  02/18/17 5\' 7"  (1.702 m)   Weight:  Wt Readings from Last 1 Encounters:  02/19/17 129 lb 6.6 oz (58.7 kg)   Wt Readings from Last 10 Encounters:  02/19/17 129 lb 6.6 oz (58.7 kg)  01/20/17 128 lb (58.1 kg)  11/04/16 122 lb (55.3 kg)  09/17/16 116 lb (52.6 kg)  08/14/16 118 lb (53.5 kg)  07/22/16 122 lb 3.2 oz (55.4 kg)  03/13/16 127 lb (57.6 kg)  09/11/15 138 lb 6.4 oz (62.8 kg)  04/17/15 135 lb (61.2 kg)  03/26/15 140 lb (63.5 kg)   Ideal Body Weight:  61.36 kg  BMI:  Body mass index using admit wt is 19.3 kg/m.  Estimated Nutritional Needs:  Kcal:  1800-2000 kcals (32-36 kcal/kg bw) Protein:  80-90g Pro (1.4-1.6 g/kg bw) Fluid:  Per MD  EDUCATION NEEDS:  No  education needs identified at this time  Burtis Junes RD, LDN, [facility] Pager: [phone] 02/19/2017 1:22 PM

## 2017-02-19 NOTE — Evaluation (Signed)
Physical Therapy Evaluation Patient Details Name: Mary [name] MRN: [id number] DOB: 01/11/1951 Today's Date: 02/19/2017   History of Present Illness  66 y.o. female with medical history significant of COPD not apparently on home O2; chronic systolic HF (EF 08-65% in 2015), afib on Pradaxa; DM; and anxiety presenting to the hospital with worsening SOB.  She has acute on chronic hypoxic respiratory failure. She has COPD exacerbation.  She had CT of the chest which I have personally reviewed for pulmonary  embolism and it did not show pulmonary embolism but did show some mediastinal adenopathy. She also had areas in the superior segment of the right upper lobe all of this is concerning that she may have a primary lung cancer.  Clinical Impression  Pt received in bed, and was agreeable to PT evaluation.  Pt states she normally uses quad cane for ambulation, but occasionally uses the RW or w/c.  She is independent with dressing and bathing, and her granddaughter assists with running errands.  Pt was able to ambulate 20ft with RW and supervision.  She is recommended for HHPT upon d/c to improve strength, balance and endurance.    Follow Up Recommendations Home health PT    Equipment Recommendations  None recommended by PT    Recommendations for Other Services       Precautions / Restrictions Precautions Precautions: Fall Precaution Comments: 1 fall - due to orthostatic hypotension after starting a new BP medication.  Restrictions Weight Bearing Restrictions: No      Mobility  Bed Mobility Overal bed mobility: Modified Independent Bed Mobility: Supine to Sit     Supine to sit: Modified independent (Device/Increase time)        Transfers Overall transfer level: Needs assistance Equipment used: Rolling walker (2 wheeled) Transfers: Sit to/from Stand Sit to Stand: Min guard            Ambulation/Gait Ambulation/Gait assistance: Supervision Ambulation Distance (Feet): 40  Feet Assistive device: Rolling walker (2 wheeled) Gait Pattern/deviations: Step-through pattern   Gait velocity interpretation: <1.8 ft/sec, indicative of risk for recurrent falls General Gait Details: decreased cadence  Stairs            Wheelchair Mobility    Modified Rankin (Stroke Patients Only)       Balance Overall balance assessment: History of Falls;Needs assistance Sitting-balance support: Feet supported Sitting balance-Leahy Scale: Good     Standing balance support: Bilateral upper extremity supported Standing balance-Leahy Scale: Fair                               Pertinent Vitals/Pain Pain Assessment: 0-10 Pain Score: 10-Worst pain ever Pain Location: chest pain from coughing.  Pain Descriptors / Indicators: Aching Pain Intervention(s): Limited activity within patient's tolerance;Monitored during session;Repositioned    Home Living   Living Arrangements: Alone Available Help at Discharge: Family (granddaughter checks on her every day. ) Type of Home: Apartment Home Access: Stairs to enter   [employer] of Steps: 3+1 Home Layout: One level Home Equipment: Environmental consultant - 2 wheels;Wheelchair - manual;Grab bars - tub/shower;Cane - quad;Cane - single point;Bedside commode;Shower seat      Prior Function Level of Independence: Independent with assistive device(s)   Gait / Transfers Assistance Needed: Pt uses cane for ambulation in the house most of the time.  Still drives a little bit when necessary, otherwise granddaughter helps her run errands.  Pt still uses cane in the community, but will  use the RW or the w/c if needed.    ADL's / Homemaking Assistance Needed: independent.         Hand Dominance   Dominant Hand: Right    Extremity/Trunk Assessment   Upper Extremity Assessment Upper Extremity Assessment: Overall WFL for tasks assessed    Lower Extremity Assessment Lower Extremity Assessment: Generalized weakness        Communication   Communication: No difficulties  Cognition Arousal/Alertness: Awake/alert Behavior During Therapy: WFL for tasks assessed/performed Overall Cognitive Status: Within Functional Limits for tasks assessed                                        General Comments      Exercises     Assessment/Plan    PT Assessment Patient needs continued PT services  PT Problem List Decreased strength;Decreased activity tolerance;Decreased balance;Decreased mobility;Cardiopulmonary status limiting activity       PT Treatment Interventions DME instruction;Gait training;Functional mobility training;Therapeutic activities;Therapeutic exercise;Patient/family education;Balance training    PT Goals (Current goals can be found in the Care Plan section)  Acute Rehab PT Goals Patient Stated Goal: To get stronger PT Goal Formulation: With patient Time For Goal Achievement: 02/26/17 Potential to Achieve Goals: Fair    Frequency Min 3X/week   Barriers to discharge Decreased caregiver support Pt lives alone    Co-evaluation               End of Session Equipment Utilized During Treatment: Gait belt;Oxygen Activity Tolerance: Patient limited by fatigue Patient left: in chair;with call bell/phone within reach Nurse Communication: Mobility status (mobiltiy sheet left hanging in the bed. ) PT Visit Diagnosis: Muscle weakness (generalized) (M62.81);Other abnormalities of gait and mobility (R26.89)    Time: 8786-7672 PT Time Calculation (min) (ACUTE ONLY): 31 min   Charges:   PT Evaluation $PT Eval Low Complexity: 1 Procedure PT Treatments $Gait Training: 8-22 mins   PT G Codes:   PT G-Codes **NOT FOR INPATIENT CLASS** Functional Assessment Tool Used: Clinical judgement;AM-PAC 6 Clicks Basic Mobility Functional Limitation: Mobility: Walking and moving around Mobility: Walking and Moving Around Current Status (C9470): At least 20 percent but less than 40 percent  impaired, limited or restricted Mobility: Walking and Moving Around Goal Status [phone]): At least 1 percent but less than 20 percent impaired, limited or restricted    Beth Cote Mayabb, PT, DPT X: [phone]

## 2017-02-19 NOTE — Progress Notes (Signed)
PROGRESS NOTE    Mary [name]  JIR:[id number] DOB: [birth year] DOA: 02/18/2017 PCP: Wende Neighbors, MD   Brief Narrative:  Mary [name] is a 65 y.o. female with medical history significant of COPD not apparently on home O2; chronic systolic HF (EF 75-10% in 2015), afib on Pradaxa; DM; and anxiety presenting to the hospital with worsening SOB.  Symptoms started last Friday.  She was hospitalized for SOB from 1/10-12 for apparent COPD exacerbation and/or mild CHF exacerbation (did not require BIPAP).  She was treated with IV Lasix, steroids, and azithromycin.She was unable to provide further history at the time of admission due to somnolence on BIPAP. Per report she had SOB associated with a cough for the past 6 days and had been taking tylenol and her inhaler without relief. Had reported she had a fever and CP but on my evaluation today she states it is from when she has been coughing so much. Still smokes occasional Cigarettes. Was admitted to SDU for COPD Exacerbation and placed on BiPAP and is now weaned off BiPAP and slowly improving.   Assessment & Plan:   Principal Problem:   Acute on chronic respiratory failure with hypoxia (HCC) Active Problems:   Diabetes mellitus (HCC)   Diastolic congestive heart failure (HCC)   Atrial fibrillation with rapid ventricular response (HCC)   COPD with acute exacerbation (HCC)   Hypomagnesemia  Acute Hypercapnic Hypoxic Respiratory Failure requiring NIPPV with BiPAP likely 2/2 to Acute Exacerbation of COPD -O2 Saturations in ED were in the 60'S -Weaned BiPAP and transitioned to O2 via [place] -Continuous Pulse Oximetry and Maintain O2 Saturations > 92% -ABG yesterday showed 7.344/53.4/87.8/26.5/95.8% -Check Respiratory Virus Panel; Place on Droplet Precautions -Repeat CXR and ABG in AM  Acute Exacerbation of COPD -As above -Check Respiratory Virus Panel and Droplet Precautions -C/w IV Methylprednisolone 80 mg BID -C/w IV Azithromycin  500 mg q24h -DuoNebs  changed to Xopenex/Atrovent q6h and Added Symibort Substitution of Dulera -C/w Montelukast 10 mg po Daily -C/w Mucinex DM 1 tab po BID   Afib with RVR -Patient with known and Chronic Atrial Fib,  -C/w Bedside Telemetry -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 -Was intially started on Cardizem gtt and stopped -C/w Digoxin 125 mcg po Daily -C/w 100 mg of Metoprolol Daily and 75 mg of Metoprolol qhs  DM -Hold oral hypoglycemics -Repeat HbA1c was 6.9, the last was 8.6 in 4/16 -Will cover with Moderate Novolog SSI q4h -CBG's ranging from 133-208  Hypomagnesemia -Magnesium was 1.6 on Admission; Repeat Mag Level this AM was 1.2 -Replete IV Mag Sulfate 4 grams  -C/w Mag Oxide 200 mg po Daily -Repeat Mag Level in AM  Chronic Systolic Heart Failure with EF of 30-35% -Currently Euvolemic and Compensated -C/w Metoprolol 100 mg Daily and Metoprolol 75 mg po qHs -C/w Lisinopril 2.5 mg po Daily  Hypertension -Currently well controlled -C/w 100 mg of Metoprolol Daily and 75 mg of Metoprolol qhs -C/w Lisinopril  2.5 mg po Daily  Hyperlipidemia -C/w Atorvastatin 40 mg po qHS  Anxiety  -C/w Alprazolam 1 mg po 4 times daily  Leukocytosis -Resolved -WBC went from 14.6 -> 8.3 -IVF now D/C'd  Moderate Protein Calorie Malnutrition -Dietician/Nutritionist consult appreciated -C/w Glucerna 237 mL po BID between meals  DVT prophylaxis: Anticoagulated with Dabigatran 150 mg po BID Code Status: FULL CODE Family Communication: No family present at bedside Disposition Plan: Home Health PT when medically stable  Consultants:   None   Procedures:  None  Antimicrobials:  Anti-infectives    Start     Dose/Rate Route Frequency Ordered Stop   02/18/17 1530  azithromycin (ZITHROMAX) 500 mg in dextrose 5 % 250 mL IVPB     500 mg 250 mL/hr over 60 Minutes Intravenous Every 24 hours 02/18/17 1526       Subjective: Seen and examined and states her breathing is  slightly better but she still feels bad. No nausea or vomiting but stating she is coughing quite a bit. No other concerns or complaints at this time.   Objective: Vitals:   02/19/17 1147 02/19/17 1200 02/19/17 1605 02/19/17 1703  BP:  123/68    Pulse:  99    Resp:  16    Temp:    98.7 F (37.1 C)  TempSrc:    Oral  SpO2: 95% 96% 94%   Weight:      Height:        Intake/Output Summary (Last 24 hours) at 02/19/17 1749 Last data filed at 02/19/17 1410  Gross per 24 hour  Intake              900 ml  Output             1500 ml  Net             -600 ml   Filed Weights   02/18/17 1052 02/18/17 1822 02/19/17 0500  Weight: 55.8 kg (123 lb) 60 kg (132 lb 4.4 oz) 58.7 kg (129 lb 6.6 oz)   Examination: Physical Exam:  Constitutional: NAD and appears calm but slightly dyspenic Eyes: Lids and conjunctivae normal, sclerae anicteric  ENMT: External Ears, Nose appear normal. Grossly normal hearing. Mucous membranes are moist.  Neck: Appears normal, supple, no cervical masses, normal ROM, no appreciable thyromegaly Respiratory: Diminished to auscultation bilaterally with some wheezing and rhonchi. No appreciable crackles or rales.. Increased respiratory effort but no accessory muscle use. Patient wearing O2 via [place] Cardiovascular: Irregularly Irregular and slightly tachy, Slight 2/6 Systolic murmur. No rubs / gallops. S1 and S2 auscultated. No extremity edema.  Abdomen: Soft, non-tender, non-distended. No masses palpated. No appreciable hepatosplenomegaly. Bowel sounds positive x4.  GU: Deferred. Musculoskeletal: No clubbing / cyanosis of digits/nails. No joint deformity upper and lower extremities.  Skin: No rashes, lesions, ulcers on limited skin evaluation. No induration; Warm and dry.  Neurologic: CN 2-12 grossly intact with no focal deficits. Romberg sign cerebellar reflexes not assessed.  Psychiatric: Normal judgment and insight. Alert and oriented x 3. Normal mood and appropriate  affect.   Data Reviewed: I have personally reviewed following labs and imaging studies  CBC:  Recent Labs Lab 02/18/17 1106 02/19/17 0452  WBC 14.6* 8.3  NEUTROABS 11.3*  --   HGB 12.6 10.6*  HCT 40.4 32.6*  MCV 93.1 92.1  PLT 267 096   Basic Metabolic Panel:  Recent Labs Lab 02/18/17 1106 02/18/17 1111 02/19/17 0452  NA 134*  --  136  K 5.0  --  4.4  CL 92*  --  96*  CO2 31  --  32  GLUCOSE 175*  --  136*  BUN 11  --  9  CREATININE 0.61  --  0.42*  CALCIUM 10.0  --  9.0  MG  --  1.6* 1.2*   GFR: Estimated Creatinine Clearance: 63.2 mL/min (A) (by C-G formula based on SCr of 0.42 mg/dL (L)). Liver Function Tests:  Recent Labs Lab 02/18/17 1106  AST 28  ALT 19  ALKPHOS  80  BILITOT 0.8  PROT 8.6*  ALBUMIN 4.6    Recent Labs Lab 02/18/17 1106  LIPASE 32   No results for input(s): AMMONIA in the last 168 hours. Coagulation Profile:  Recent Labs Lab 02/18/17 1106  INR 1.43   Cardiac Enzymes: No results for input(s): CKTOTAL, CKMB, CKMBINDEX, TROPONINI in the last 168 hours. BNP (last 3 results) No results for input(s): PROBNP in the last 8760 hours. HbA1C:  Recent Labs  02/18/17 1734  HGBA1C 6.9*   CBG:  Recent Labs Lab 02/19/17 0018 02/19/17 0433 02/19/17 0739 02/19/17 1150 02/19/17 1702  GLUCAP 158* 130* 133* 195* 208*   Lipid Profile: No results for input(s): CHOL, HDL, LDLCALC, TRIG, CHOLHDL, LDLDIRECT in the last 72 hours. Thyroid Function Tests: No results for input(s): TSH, T4TOTAL, FREET4, T3FREE, THYROIDAB in the last 72 hours. Anemia Panel: No results for input(s): VITAMINB12, FOLATE, FERRITIN, TIBC, IRON, RETICCTPCT in the last 72 hours. Sepsis Labs:  Recent Labs Lab 02/18/17 1109 02/18/17 2050 02/19/17 0452  PROCALCITON  --  <0.10 <0.10  LATICACIDVEN 1.83  --   --     Recent Results (from the past 240 hour(s))  MRSA PCR Screening     Status: None   Collection Time: 02/18/17  6:15 PM  Result Value Ref Range  Status   MRSA by PCR NEGATIVE NEGATIVE Final    Comment:        The GeneXpert MRSA Assay (FDA approved for NASAL specimens only), is one component of a comprehensive MRSA colonization surveillance program. It is not intended to diagnose MRSA infection nor to guide or monitor treatment for MRSA infections.     Radiology Studies: Ct Angio Chest Pe W And/or Wo Contrast  Result Date: 02/18/2017 CLINICAL DATA:  Chest pain and shortness of breath EXAM: CT ANGIOGRAPHY CHEST WITH CONTRAST TECHNIQUE: Multidetector CT imaging of the chest was performed using the standard protocol during bolus administration of intravenous contrast. Multiplanar CT image reconstructions and MIPs were obtained to evaluate the vascular anatomy. CONTRAST:  100 mL Isovue 370 nonionic COMPARISON:  Chest radiograph February 18, 2017 FINDINGS: Cardiovascular: There is no demonstrable pulmonary embolus. There is no thoracic aortic aneurysm or dissection. There is moderate atherosclerotic calcification in the proximal visualized great vessels. There is extensive calcification in the aorta. There are multiple foci of native coronary artery calcification. The patient is status post coronary artery bypass grafting. Pericardium is not appreciably thickened. Mediastinum/Nodes: Thyroid appears normal. There are multiple subcentimeter mediastinal lymph nodes. There is an enlarged lymph node anterior to the carina measuring 2.3 x 1.8 cm. There is sub- carinal adenopathy measuring 3.2 x 2.2 cm. There is an enlarged lymph node in the right hilum measuring 1.7 x 1.1 cm. There is a small hiatal hernia. Lungs/Pleura: On axial slice 52 series 8, there is a 5 mm nodular opacity in the anterior segment of the left upper lobe. There is a somewhat irregular masslike area in the superior segment right lower lobe measuring 1.3 x 1.1 x 0.9 cm, best seen on axial slice 57 series 8 and coronal slice 341 series 10. There is a nodular opacity in the posterior  segment of the right upper lobe on axial slice 59 series 8 measuring 4 mm. There is a nodular lesion in the superior segment right lower lobe seen on axial slice 64 series 8 measuring 8 x 5 mm. There is a nodular lesion also in the superior segment of the right lower lobe on axial slice 63 series  8 measuring 0.9 x 0.8 cm. There is a 3 mm nodular opacity in the lateral segment of the left lower lobe on axial slice 63 series 8. There is a 5 mm nodular opacity in the posterior aspect of the superior segment of the left lower lobe seen on axial slice 67 series 8. There are multiple scattered 1-2 mm nodular opacities in the upper lobes bilaterally. There is atelectatic change in the anterior lung bases bilaterally. There is no frank airspace consolidation. No pleural effusion or pleural thickening. Upper Abdomen: There is reflux of contrast into the inferior vena cava and hepatic veins. Visualized upper abdominal structures otherwise appear unremarkable. Musculoskeletal: Patient is status post median sternotomy. There are no blastic or lytic bone lesions. Review of the MIP images confirms the above findings. IMPRESSION: No demonstrable pulmonary embolus. Reflux of contrast into the inferior vena cava and hepatic veins may well represent a degree of increased right heart pressure. There are areas of adenopathy, most pronounced in the precarinal and sub- carinal regions. There are nodular opacities in the lungs, largest measuring 1.3 x 1.1 x 0.9 cm in the superior segment right lower lobe. This combination of findings is concerning for neoplasm. Advise PET-CT to further evaluate. No frank edema or consolidation. Extensive atherosclerotic calcification. Patient is status post coronary artery bypass grafting. Electronically Signed   By: Lowella Grip III M.D.   On: 02/18/2017 15:19   Dg Chest Portable 1 View  Result Date: 02/18/2017 CLINICAL DATA:  66 year old presenting with a 6 day history of productive cough,  chest congestion and shortness of breath. Current history of COPD. Prior CABG. Hypoxia noted while in the emergency department. EXAM: PORTABLE CHEST 1 VIEW COMPARISON:  11/04/2016, 09/17/2016 and earlier. FINDINGS: Sternotomy for CABG. Cardiac silhouette upper normal in size for AP portable technique, unchanged. Left atrial appendage clip. Mildly prominent bronchovascular markings diffusely, unchanged. Lungs otherwise clear. No localized airspace consolidation. No pleural effusions. No pneumothorax. Normal pulmonary vascularity. IMPRESSION: No acute cardiopulmonary disease. Electronically Signed   By: Evangeline Dakin M.D.   On: 02/18/2017 11:35   Scheduled Meds: . ALPRAZolam  1 mg Oral QID  . aspirin EC  81 mg Oral Daily  . atorvastatin  40 mg Oral q1800  . dabigatran  150 mg Oral BID  . dextromethorphan-guaiFENesin  1 tablet Oral BID  . digoxin  125 mcg Oral Daily  . feeding supplement (GLUCERNA SHAKE)  237 mL Oral BID BM  . gabapentin  100 mg Oral TID  . insulin aspart  0-15 Units Subcutaneous Q4H  . ipratropium  0.5 mg Nebulization Q6H  . levalbuterol  0.63 mg Nebulization Q6H  . lisinopril  2.5 mg Oral Daily  . magnesium oxide  200 mg Oral Daily  . methylPREDNISolone (SOLU-MEDROL) injection  80 mg Intravenous Q12H  . metoprolol tartrate  100 mg Oral Daily   And  . metoprolol tartrate  75 mg Oral QHS  . mometasone-formoterol  2 puff Inhalation BID  . montelukast  10 mg Oral Daily   Continuous Infusions: . azithromycin Stopped (02/18/17 1733)  . diltiazem (CARDIZEM) infusion Stopped (02/18/17 1633)  . magnesium sulfate 1 - 4 g bolus IVPB     TOTAL CRITICAL CARE TIME: 32 Minutes   LOS: 1 day   Kerney Elbe, DO [facility] Pager [phone]  If 7PM-7AM, please contact night-coverage [email or web address] Password [facility] 02/19/2017, 5:49 PM

## 2017-02-19 NOTE — Care Management Note (Signed)
Case Management Note  Patient Details  Name: Mary [name] MRN: [id number] Date of Birth: 06/10/1951  Subjective/Objective:                  Pt admitted with COPD. She is from home, lives alone, has family for support. He has PCP, transportation to appointments and no difficulty affording medications. She plans to return home at [place]. PT has recommended HH PT, pt agreeable, requests [facility], understands HH has 48hrs to make first visit. Pt uses cane, has rollator and BSC. She has neb machine pta. She does not have oxygen pta. She would like oxygen from [facility] also if needed.  Action/Plan: Plan for return home with [facility] PT through [facility]. Romualdo Bolk, [facility] rep, aware of referral and will obtain pt info form chart. Pt will need to wean from oxygen or have home oxygen assessment performed. CM will cont to follow.   Expected Discharge Date:  02/22/17               Expected Discharge Plan:  [place]  In-House Referral:    N/A  Discharge planning Services  CM Consult  Post Acute Care Choice:  Home Health Choice offered to:  Patient  HH Arranged:  PT [place] Agency:  [place]  Status of Service:  In process, will continue to follow  Sherald Barge, RN 02/19/2017, 3:25 PM

## 2017-02-19 NOTE — Consult Note (Signed)
Consult requested by: [facility] Consult requested for: Acute on chronic respiratory failure abnormal chest CT  HPI: This is a 66 year old who has a significant history of COPD, congestive heart failure, chronic atrial fibrillation, chronic pain from degenerative disc disease, diabetes. She came to the emergency department with shortness of breath and when she came in she was found to have respiratory failure. She has been complaining of some chest pain. She was hypoxic on admission became very dyspneic so she was started on BiPAP. She had CT of the chest which I have personally reviewed for pulmonary  embolism and it did not show pulmonary embolism but did show some mediastinal adenopathy. She also had areas in the superior segment of the right upper lobe all of this is concerning that she may have a primary lung cancer. She is sleepy still and on BiPAP and not really able to provide much more history. History is mostly from the medical record at this point.  Past Medical History:  Diagnosis Date  . Anxiety   . Arthritis   . Atrial fibrillation ([place])   . Bronchial asthma   . CHF (congestive heart failure) ([place])   . Chronic pain    Bacl pain, Disc L5-S1- Dr. Joya Salm in [place]  . COPD (chronic obstructive pulmonary disease) ([place])   . DDD (degenerative disc disease)    Radicular symptoms  . Diabetes mellitus   . History of stress test 06/2011   Abnormal myocardial perfusion study.  Marland Kitchen Hx of echocardiogram    Was interpreted by Dr Doylene Canard that showed an Ef in the 50-60% range with grade 1 diastolic dysfunction. she had moderate MR, biatrial enlargement, moderate TR and at that time estimated RV systolic pressure was 43 mm.  . Hyperlipidemia   . Hypertension   . Neuropathy   . Shortness of breath   . Tachycardia      Family History  Problem Relation Age of Onset  . Diabetes Mother   . Heart disease Mother   . Diabetes Sister   . Hypertension Sister   . Hyperlipidemia Sister   .  Diabetes Brother   . Heart disease Brother   . Diabetes Brother   . Heart disease Brother      Social History   Social History  . Marital status: Divorced    Spouse name: N/A  . Number of children: 1  . Years of education: 58   Social History Main Topics  . Smoking status: Former Smoker    Packs/day: 1.50    Years: 30.00    Types: Cigarettes    Quit date: 05/26/2013  . Smokeless tobacco: Never Used     Comment: smells of heavy smoke 02/18/17  . Alcohol use No  . Drug use: No  . Sexual activity: Yes    Birth control/ protection: Surgical   Other Topics Concern  . None   Social History Narrative   Lives at home with sister and sister-in-law   Caffeine use : drink 1 cup coffee in morning         ROS: Not obtainable   Objective: Vital signs in last 24 hours: Temp:  [96.6 F (35.9 C)-98.4 F (36.9 C)] 96.6 F (35.9 C) (04/27 0804) Pulse Rate:  [60-141] 73 (04/27 0500) Resp:  [17-34] 21 (04/27 0825) BP: (90-155)/(65-100) 137/74 (04/27 0500) SpO2:  [69 %-100 %] 98 % (04/27 0825) FiO2 (%):  [50 %] 50 % (04/27 0825) Weight:  [55.8 kg (123 lb)-60 kg (132 lb 4.4 oz)]  58.7 kg (129 lb 6.6 oz) (04/27 0500) Weight change:     Intake/Output from previous day: 04/26 0701 - 04/27 0700 In: 2150 [I.V.:900; IV Piggyback:1250] Out: 1000 [Urine:1000]  PHYSICAL EXAM Constitutional: She is sleepy and on BiPAP. Her nurse says that she woke up and was able to have a conversation earlier but that she was up most of the night. Eyes: Pupils react. EOMI. Ears nose mouth and throat: I can't see her throat well her hearing is grossly normal. Cardiovascular: Her heart is not regular with atrial fib and her heart rates about 90. No gallop. No edema of the extremities. Respiratory: Her respiratory effort is increased and she is on BiPAP. She has bilateral wheezing and rhonchi. Gastrointestinal: Her abdomen is soft with no masses. Skin: Warm and dry. Musculoskeletal: She has grossly normal  strength in her upper and lower extremities. Neurological: No focal abnormalities psychiatric: Difficult to assess because of her drowsiness  Lab Results: Basic Metabolic Panel:  Recent Labs  02/18/17 1106 02/18/17 1111 02/19/17 0452  NA 134*  --  136  K 5.0  --  4.4  CL 92*  --  96*  CO2 31  --  32  GLUCOSE 175*  --  136*  BUN 11  --  9  CREATININE 0.61  --  0.42*  CALCIUM 10.0  --  9.0  MG  --  1.6*  --    Liver Function Tests:  Recent Labs  02/18/17 1106  AST 28  ALT 19  ALKPHOS 80  BILITOT 0.8  PROT 8.6*  ALBUMIN 4.6    Recent Labs  02/18/17 1106  LIPASE 32   No results for input(s): AMMONIA in the last 72 hours. CBC:  Recent Labs  02/18/17 1106 02/19/17 0452  WBC 14.6* 8.3  NEUTROABS 11.3*  --   HGB 12.6 10.6*  HCT 40.4 32.6*  MCV 93.1 92.1  PLT 267 200   Cardiac Enzymes: No results for input(s): CKTOTAL, CKMB, CKMBINDEX, TROPONINI in the last 72 hours. BNP: No results for input(s): PROBNP in the last 72 hours. D-Dimer: No results for input(s): DDIMER in the last 72 hours. CBG:  Recent Labs  02/18/17 1824 02/18/17 1947 02/19/17 0018 02/19/17 0433 02/19/17 0739  GLUCAP 172* 161* 158* 130* 133*   Hemoglobin A1C:  Recent Labs  02/18/17 1734  HGBA1C 6.9*   Fasting Lipid Panel: No results for input(s): CHOL, HDL, LDLCALC, TRIG, CHOLHDL, LDLDIRECT in the last 72 hours. Thyroid Function Tests: No results for input(s): TSH, T4TOTAL, FREET4, T3FREE, THYROIDAB in the last 72 hours. Anemia Panel: No results for input(s): VITAMINB12, FOLATE, FERRITIN, TIBC, IRON, RETICCTPCT in the last 72 hours. Coagulation:  Recent Labs  02/18/17 1106  LABPROT 17.5*  INR 1.43   Urine Drug Screen: Drugs of Abuse     Component Value Date/Time   LABOPIA NONE DETECTED 02/18/2017 2032   COCAINSCRNUR NONE DETECTED 02/18/2017 2032   COCAINSCRNUR NEG 01/25/2015 1203   LABBENZ POSITIVE (A) 02/18/2017 2032   LABBENZ POS (A) 05/10/2013 1252   AMPHETMU  NONE DETECTED 02/18/2017 2032   THCU NONE DETECTED 02/18/2017 2032   LABBARB NONE DETECTED 02/18/2017 2032    Alcohol Level: No results for input(s): ETH in the last 72 hours. Urinalysis: No results for input(s): COLORURINE, LABSPEC, PHURINE, GLUCOSEU, HGBUR, BILIRUBINUR, KETONESUR, PROTEINUR, UROBILINOGEN, NITRITE, LEUKOCYTESUR in the last 72 hours.  Invalid input(s): APPERANCEUR Misc. Labs:   ABGS:  Recent Labs  02/18/17 2110  PHART 7.344*  PO2ART 87.8  HCO3  26.5     MICROBIOLOGY: Recent Results (from the past 240 hour(s))  MRSA PCR Screening     Status: None   Collection Time: 02/18/17  6:15 PM  Result Value Ref Range Status   MRSA by PCR NEGATIVE NEGATIVE Final    Comment:        The GeneXpert MRSA Assay (FDA approved for NASAL specimens only), is one component of a comprehensive MRSA colonization surveillance program. It is not intended to diagnose MRSA infection nor to guide or monitor treatment for MRSA infections.     Studies/Results: Ct Angio Chest Pe W And/or Wo Contrast  Result Date: 02/18/2017 CLINICAL DATA:  Chest pain and shortness of breath EXAM: CT ANGIOGRAPHY CHEST WITH CONTRAST TECHNIQUE: Multidetector CT imaging of the chest was performed using the standard protocol during bolus administration of intravenous contrast. Multiplanar CT image reconstructions and MIPs were obtained to evaluate the vascular anatomy. CONTRAST:  100 mL Isovue 370 nonionic COMPARISON:  Chest radiograph February 18, 2017 FINDINGS: Cardiovascular: There is no demonstrable pulmonary embolus. There is no thoracic aortic aneurysm or dissection. There is moderate atherosclerotic calcification in the proximal visualized great vessels. There is extensive calcification in the aorta. There are multiple foci of native coronary artery calcification. The patient is status post coronary artery bypass grafting. Pericardium is not appreciably thickened. Mediastinum/Nodes: Thyroid appears normal.  There are multiple subcentimeter mediastinal lymph nodes. There is an enlarged lymph node anterior to the carina measuring 2.3 x 1.8 cm. There is sub- carinal adenopathy measuring 3.2 x 2.2 cm. There is an enlarged lymph node in the right hilum measuring 1.7 x 1.1 cm. There is a small hiatal hernia. Lungs/Pleura: On axial slice 52 series 8, there is a 5 mm nodular opacity in the anterior segment of the left upper lobe. There is a somewhat irregular masslike area in the superior segment right lower lobe measuring 1.3 x 1.1 x 0.9 cm, best seen on axial slice 57 series 8 and coronal slice 902 series 10. There is a nodular opacity in the posterior segment of the right upper lobe on axial slice 59 series 8 measuring 4 mm. There is a nodular lesion in the superior segment right lower lobe seen on axial slice 64 series 8 measuring 8 x 5 mm. There is a nodular lesion also in the superior segment of the right lower lobe on axial slice 63 series 8 measuring 0.9 x 0.8 cm. There is a 3 mm nodular opacity in the lateral segment of the left lower lobe on axial slice 63 series 8. There is a 5 mm nodular opacity in the posterior aspect of the superior segment of the left lower lobe seen on axial slice 67 series 8. There are multiple scattered 1-2 mm nodular opacities in the upper lobes bilaterally. There is atelectatic change in the anterior lung bases bilaterally. There is no frank airspace consolidation. No pleural effusion or pleural thickening. Upper Abdomen: There is reflux of contrast into the inferior vena cava and hepatic veins. Visualized upper abdominal structures otherwise appear unremarkable. Musculoskeletal: Patient is status post median sternotomy. There are no blastic or lytic bone lesions. Review of the MIP images confirms the above findings. IMPRESSION: No demonstrable pulmonary embolus. Reflux of contrast into the inferior vena cava and hepatic veins may well represent a degree of increased right heart pressure.  There are areas of adenopathy, most pronounced in the precarinal and sub- carinal regions. There are nodular opacities in the lungs, largest measuring 1.3 x 1.1  x 0.9 cm in the superior segment right lower lobe. This combination of findings is concerning for neoplasm. Advise PET-CT to further evaluate. No frank edema or consolidation. Extensive atherosclerotic calcification. Patient is status post coronary artery bypass grafting. Electronically Signed   By: Lowella Grip III M.D.   On: 02/18/2017 15:19   Dg Chest Portable 1 View  Result Date: 02/18/2017 CLINICAL DATA:  66 year old presenting with a 6 day history of productive cough, chest congestion and shortness of breath. Current history of COPD. Prior CABG. Hypoxia noted while in the emergency department. EXAM: PORTABLE CHEST 1 VIEW COMPARISON:  11/04/2016, 09/17/2016 and earlier. FINDINGS: Sternotomy for CABG. Cardiac silhouette upper normal in size for AP portable technique, unchanged. Left atrial appendage clip. Mildly prominent bronchovascular markings diffusely, unchanged. Lungs otherwise clear. No localized airspace consolidation. No pleural effusions. No pneumothorax. Normal pulmonary vascularity. IMPRESSION: No acute cardiopulmonary disease. Electronically Signed   By: Evangeline Dakin M.D.   On: 02/18/2017 11:35    Medications:  Prior to Admission:  Prescriptions Prior to Admission  Medication Sig Dispense Refill Last Dose  . ACCU-CHEK AVIVA PLUS test strip USE TO TEST 3 TO 4 TIMES DAILY OR AS DIRECTED. 100 each PRN Taking  . acetaminophen (TYLENOL) 500 MG tablet Take 500 mg by mouth every 6 (six) hours as needed for mild pain.   Taking  . albuterol (PROAIR HFA) 108 (90 BASE) MCG/ACT inhaler Inhale 2 puffs into the lungs every 6 (six) hours as needed for wheezing or shortness of breath. 18 g 11 02/18/2017 at Unknown time  . ALPRAZolam (XANAX) 1 MG tablet Take 1 mg by mouth 4 (four) times daily.    02/18/2017 at Unknown time  . aspirin EC  81 MG tablet Take 81 mg by mouth daily.   02/18/2017 at Unknown time  . atorvastatin (LIPITOR) 40 MG tablet Take 1 tablet (40 mg total) by mouth daily. KEEP OV. 90 tablet 1 02/18/2017 at Unknown time  . DIGOX 125 MCG tablet TAKE ONE TABLET BY MOUTH ONCE DAILY. 30 tablet 10 02/18/2017 at Unknown time  . fluticasone (FLONASE) 50 MCG/ACT nasal spray Place 2 sprays into both nostrils daily. (Patient taking differently: Place 2 sprays into both nostrils daily as needed for allergies. ) 16 g 6 Taking  . furosemide (LASIX) 40 MG tablet TAKE ONE TABLET BY MOUTH ONCE DAILY. 30 tablet 6 02/18/2017 at Unknown time  . gabapentin (NEURONTIN) 100 MG capsule Take 1 capsule by mouth 3 (three) times daily.   02/18/2017 at Unknown time  . glipiZIDE (GLUCOTROL XL) 2.5 MG 24 hr tablet 2.5 mg daily.    02/18/2017 at Unknown time  . JANUVIA 100 MG tablet TAKE ONE TABLET BY MOUTH DAILY. 30 tablet 0 02/18/2017 at Unknown time  . lisinopril (PRINIVIL,ZESTRIL) 2.5 MG tablet Take 1 tablet (2.5 mg total) by mouth daily. 30 tablet 11 02/18/2017 at Unknown time  . Magnesium Oxide 250 MG TABS Take 250 mg by mouth daily.    02/18/2017 at Unknown time  . metFORMIN (GLUCOPHAGE) 850 MG tablet TAKE 1 TABLET BY MOUTH TWICE DAILY WITH MEALS. 60 tablet 0 02/18/2017 at Unknown time  . metoprolol (LOPRESSOR) 50 MG tablet Take 2 tablets in the morning and 1 1/2 tablets at night 105 tablet 11 02/18/2017 at 700  . montelukast (SINGULAIR) 10 MG tablet Take 1 tablet by mouth daily.   02/18/2017 at Unknown time  . Multiple Vitamin (MULTIVITAMIN) capsule Take 1 capsule by mouth daily.   02/18/2017 at Unknown time  .  potassium chloride SA (K-DUR,KLOR-CON) 20 MEQ tablet Take 1 tablet (20 mEq total) by mouth daily. 30 tablet 0 02/18/2017 at Unknown time  . PRADAXA 150 MG CAPS capsule TAKE ONE CAPSULE BY MOUTH TWICE DAILY. 60 capsule 10 02/18/2017 at 700  . sodium chloride (OCEAN) 0.65 % nasal spray Place 1 spray into the nose as needed for congestion. Congestion    Taking  . SYMBICORT 160-4.5 MCG/ACT inhaler INHALE 2 PUFFS INTO THE LUNGS TWICE DAILY. RINSE MOUTH AFTER USE. 10.2 g 0 02/18/2017 at Unknown time   Scheduled: . aspirin EC  81 mg Oral Daily  . atorvastatin  40 mg Oral q1800  . dabigatran  150 mg Oral BID  . digoxin  125 mcg Oral Daily  . gabapentin  100 mg Oral TID  . insulin aspart  0-15 Units Subcutaneous Q4H  . ipratropium-albuterol  3 mL Nebulization Q6H  . lisinopril  2.5 mg Oral Daily  . methylPREDNISolone (SOLU-MEDROL) injection  80 mg Intravenous Q12H  . metoprolol tartrate  100 mg Oral Daily   And  . metoprolol tartrate  75 mg Oral QHS  . montelukast  10 mg Oral Daily   Continuous: . azithromycin Stopped (02/18/17 1733)  . diltiazem (CARDIZEM) infusion Stopped (02/18/17 1633)  . lactated ringers 75 mL/hr at 02/18/17 5300   FRT:MYTRZNBVAPOLI **OR** acetaminophen, albuterol, ondansetron **OR** ondansetron (ZOFRAN) IV  Assesment:She has acute on chronic hypoxic respiratory failure. She has COPD exacerbation. She is sleepy this morning and if she doesn't wake up in the next hour or so she'll need another blood gas. Otherwise I would order blood gases based on her clinical situation. She has atrial fibrillation rapid ventricular response and she is on diltiazem and she has slowed down. She has a history of chronic diastolic heart failure. She has lesions on her CT that are consistent with possible primary cancer and I agree PET scan should be done as an outpatient. It is certainly possible that these areas instead or pneumonia with adenopathy from that but I think that's less likely  Principal Problem:   Acute on chronic respiratory failure with hypoxia ([place]) Active Problems:   Diabetes mellitus ([facility])   Diastolic congestive heart failure ([facility])   Atrial fibrillation with rapid ventricular response ([place])   COPD with acute exacerbation ([facility])   Hypomagnesemia    Plan:Continue current treatments.   LOS: 1 day   Bernardine Langworthy  L 02/19/2017, 8:51 AM

## 2017-02-19 NOTE — Care Management Obs Status (Signed)
[facility] NOTIFICATION   Patient Details  Name: GENINE BECKETT MRN: [id number] Date of Birth: [birth year]   Medicare Observation Status Notification Given:  Yes    Sherald Barge, RN 02/19/2017, 1:31 PM

## 2017-02-20 ENCOUNTER — Inpatient Hospital Stay (HOSPITAL_COMMUNITY): Payer: Medicare Other

## 2017-02-20 DIAGNOSIS — R918 Other nonspecific abnormal finding of lung field: Secondary | ICD-10-CM

## 2017-02-20 DIAGNOSIS — E440 Moderate protein-calorie malnutrition: Secondary | ICD-10-CM | POA: Insufficient documentation

## 2017-02-20 LAB — CBC WITH DIFFERENTIAL/PLATELET
BASOS ABS: 0 10*3/uL (ref 0.0–0.1)
BASOS PCT: 0 %
EOS ABS: 0 10*3/uL (ref 0.0–0.7)
EOS PCT: 0 %
HCT: 30.6 % — ABNORMAL LOW (ref 36.0–46.0)
Hemoglobin: 9.9 g/dL — ABNORMAL LOW (ref 12.0–15.0)
LYMPHS PCT: 7 %
Lymphs Abs: 1.2 10*3/uL (ref 0.7–4.0)
MCH: 29.4 pg (ref 26.0–34.0)
MCHC: 32.4 g/dL (ref 30.0–36.0)
MCV: 90.8 fL (ref 78.0–100.0)
Monocytes Absolute: 1.6 10*3/uL — ABNORMAL HIGH (ref 0.1–1.0)
Monocytes Relative: 10 %
Neutro Abs: 13.1 10*3/uL — ABNORMAL HIGH (ref 1.7–7.7)
Neutrophils Relative %: 83 %
PLATELETS: 220 10*3/uL (ref 150–400)
RBC: 3.37 MIL/uL — AB (ref 3.87–5.11)
RDW: 14.9 % (ref 11.5–15.5)
WBC: 15.9 10*3/uL — AB (ref 4.0–10.5)

## 2017-02-20 LAB — BLOOD GAS, ARTERIAL
Acid-Base Excess: 9.1 mmol/L — ABNORMAL HIGH (ref 0.0–2.0)
BICARBONATE: 32.2 mmol/L — AB (ref 20.0–28.0)
DRAWN BY: 2223
FIO2: 28
O2 CONTENT: 2 L/min
O2 SAT: 96.1 %
PO2 ART: 85.6 mmHg (ref 83.0–108.0)
pCO2 arterial: 56.4 mmHg — ABNORMAL HIGH (ref 32.0–48.0)
pH, Arterial: 7.398 (ref 7.350–7.450)

## 2017-02-20 LAB — GLUCOSE, CAPILLARY
GLUCOSE-CAPILLARY: 169 mg/dL — AB (ref 65–99)
GLUCOSE-CAPILLARY: 338 mg/dL — AB (ref 65–99)
Glucose-Capillary: 161 mg/dL — ABNORMAL HIGH (ref 65–99)
Glucose-Capillary: 319 mg/dL — ABNORMAL HIGH (ref 65–99)
Glucose-Capillary: 231 mg/dL — ABNORMAL HIGH (ref 65–99)
Glucose-Capillary: 280 mg/dL — ABNORMAL HIGH (ref 65–99)
Glucose-Capillary: 268 mg/dL — ABNORMAL HIGH (ref 65–99)

## 2017-02-20 LAB — COMPREHENSIVE METABOLIC PANEL
ALBUMIN: 3.1 g/dL — AB (ref 3.5–5.0)
ALT: 14 U/L (ref 14–54)
AST: 17 U/L (ref 15–41)
Alkaline Phosphatase: 50 U/L (ref 38–126)
Anion gap: 6 (ref 5–15)
BUN: 19 mg/dL (ref 6–20)
CHLORIDE: 97 mmol/L — AB (ref 101–111)
CO2: 33 mmol/L — AB (ref 22–32)
CREATININE: 0.54 mg/dL (ref 0.44–1.00)
Calcium: 9.2 mg/dL (ref 8.9–10.3)
GFR calc Af Amer: >60 mL/min (ref >60)
GFR calc non Af Amer: >60 mL/min (ref >60)
Glucose, Bld: 181 mg/dL — ABNORMAL HIGH (ref 65–99)
POTASSIUM: 4.4 mmol/L (ref 3.5–5.1)
SODIUM: 136 mmol/L (ref 135–145)
Total Bilirubin: 0.3 mg/dL (ref 0.3–1.2)
Total Protein: 5.9 g/dL — ABNORMAL LOW (ref 6.5–8.1)

## 2017-02-20 LAB — RESPIRATORY PANEL BY PCR
ADENOVIRUS-RVPPCR: NOT DETECTED
BORDETELLA PERTUSSIS-RVPCR: NOT DETECTED
CHLAMYDOPHILA PNEUMONIAE-RVPPCR: NOT DETECTED
CORONAVIRUS NL63-RVPPCR: NOT DETECTED
Coronavirus 229E: NOT DETECTED
Coronavirus HKU1: NOT DETECTED
Coronavirus OC43: NOT DETECTED
INFLUENZA A-RVPPCR: NOT DETECTED
INFLUENZA B-RVPPCR: NOT DETECTED
MYCOPLASMA PNEUMONIAE-RVPPCR: NOT DETECTED
Metapneumovirus: NOT DETECTED
PARAINFLUENZA VIRUS 3-RVPPCR: NOT DETECTED
PARAINFLUENZA VIRUS 4-RVPPCR: NOT DETECTED
Parainfluenza Virus 1: NOT DETECTED
Parainfluenza Virus 2: NOT DETECTED
RESPIRATORY SYNCYTIAL VIRUS-RVPPCR: NOT DETECTED
RHINOVIRUS / ENTEROVIRUS - RVPPCR: NOT DETECTED

## 2017-02-20 LAB — HIV ANTIBODY (ROUTINE TESTING W REFLEX): HIV Screen 4th Generation wRfx: NONREACTIVE

## 2017-02-20 LAB — MAGNESIUM: MAGNESIUM: 1.9 mg/dL (ref 1.7–2.4)

## 2017-02-20 LAB — PROCALCITONIN: Procalcitonin: <0.1 ng/mL

## 2017-02-20 LAB — PHOSPHORUS: Phosphorus: 2.7 mg/dL (ref 2.5–4.6)

## 2017-02-20 MED ORDER — ACETYLCYSTEINE 20 % IN SOLN
3.0000 mL | Freq: Four times a day (QID) | RESPIRATORY_TRACT | Status: DC
  Administered 2017-02-20: 3 mL via RESPIRATORY_TRACT
  Administered 2017-02-20: 4 mL via RESPIRATORY_TRACT
  Administered 2017-02-21: 3 mL via RESPIRATORY_TRACT
  Administered 2017-02-21 (×2): 4 mL via RESPIRATORY_TRACT
  Administered 2017-02-22 – 2017-02-24 (×10): 3 mL via RESPIRATORY_TRACT
  Filled 2017-02-20 (×18): qty 4

## 2017-02-20 MED ORDER — INSULIN ASPART 100 UNIT/ML ~~LOC~~ SOLN
0.0000 [IU] | Freq: Three times a day (TID) | SUBCUTANEOUS | Status: DC
  Administered 2017-02-21: 20 [IU] via SUBCUTANEOUS
  Administered 2017-02-21: 15 [IU] via SUBCUTANEOUS
  Administered 2017-02-22 (×2): 20 [IU] via SUBCUTANEOUS
  Administered 2017-02-22: 7 [IU] via SUBCUTANEOUS
  Administered 2017-02-23: 15 [IU] via SUBCUTANEOUS
  Administered 2017-02-23: 20 [IU] via SUBCUTANEOUS
  Administered 2017-02-24: 11 [IU] via SUBCUTANEOUS
  Administered 2017-02-24: 7 [IU] via SUBCUTANEOUS

## 2017-02-20 MED ORDER — ORAL CARE MOUTH RINSE
15.0000 mL | Freq: Two times a day (BID) | OROMUCOSAL | Status: DC
  Administered 2017-02-20 – 2017-02-24 (×8): 15 mL via OROMUCOSAL

## 2017-02-20 MED ORDER — INSULIN ASPART 100 UNIT/ML ~~LOC~~ SOLN
0.0000 [IU] | Freq: Every day | SUBCUTANEOUS | Status: DC
  Administered 2017-02-20 – 2017-02-23 (×3): 3 [IU] via SUBCUTANEOUS

## 2017-02-20 MED ORDER — INSULIN ASPART 100 UNIT/ML ~~LOC~~ SOLN
0.0000 [IU] | Freq: Three times a day (TID) | SUBCUTANEOUS | Status: DC
  Administered 2017-02-20: 3 [IU] via SUBCUTANEOUS
  Administered 2017-02-20: 5 [IU] via SUBCUTANEOUS
  Administered 2017-02-20: 11 [IU] via SUBCUTANEOUS

## 2017-02-20 NOTE — Progress Notes (Addendum)
Subjective: She looks substantially better than yesterday. Much more alert. Off BiPAP. She's now on nasal cannula. She says she's coughing up sputum better. No complaints of chest pain nausea vomiting.  Objective: Vital signs in last 24 hours: Temp:  [97.6 F (36.4 C)-98.7 F (37.1 C)] 97.7 F (36.5 C) (04/28 0734) Pulse Rate:  [67-151] 67 (04/28 0734) Resp:  [13-27] 18 (04/28 0734) BP: (100-135)/(58-82) 100/77 (04/28 0700) SpO2:  [90 %-100 %] 96 % (04/28 0752) Weight:  [57.7 kg (127 lb 3.3 oz)] 57.7 kg (127 lb 3.3 oz) (04/28 0400) Weight change: 1.908 kg (4 lb 3.3 oz) Last BM Date: 02/18/17 (per pt, per RN staff)  Intake/Output from previous day: 04/27 0701 - 04/28 0700 In: 1140 [P.O.:1140] Out: 2050 [Urine:2050]  PHYSICAL EXAM General appearance: alert, cooperative and no distress Resp: She has bilateral rhonchi and wheezing but is much clearer than yesterday Cardio: She is in atrial fibrillation heart rate about 120 now GI: soft, non-tender; bowel sounds normal; no masses,  no organomegaly Extremities: extremities normal, atraumatic, no cyanosis or edema Skin warm and dry.  Lab Results:  Results for orders placed or performed during the hospital encounter of 02/18/17 (from the past 48 hour(s))  Comprehensive metabolic panel     Status: Abnormal   Collection Time: 02/18/17 11:06 AM  Result Value Ref Range   Sodium 134 (L) 135 - 145 mmol/L   Potassium 5.0 3.5 - 5.1 mmol/L   Chloride 92 (L) 101 - 111 mmol/L   CO2 31 22 - 32 mmol/L   Glucose, Bld 175 (H) 65 - 99 mg/dL   BUN 11 6 - 20 mg/dL   Creatinine, Ser 0.61 0.44 - 1.00 mg/dL   Calcium 10.0 8.9 - 10.3 mg/dL   Total Protein 8.6 (H) 6.5 - 8.1 g/dL   Albumin 4.6 3.5 - 5.0 g/dL   AST 28 15 - 41 U/L   ALT 19 14 - 54 U/L   Alkaline Phosphatase 80 38 - 126 U/L   Total Bilirubin 0.8 0.3 - 1.2 mg/dL   GFR calc non Af Amer >60 >60 mL/min   GFR calc Af Amer >60 >60 mL/min    Comment: (NOTE) The eGFR has been calculated  using the CKD EPI equation. This calculation has not been validated in all clinical situations. eGFR's persistently <60 mL/min signify possible Chronic Kidney Disease.    Anion gap 11 5 - 15  Lipase, blood     Status: None   Collection Time: 02/18/17 11:06 AM  Result Value Ref Range   Lipase 32 11 - 51 U/L  Brain natriuretic peptide     Status: Abnormal   Collection Time: 02/18/17 11:06 AM  Result Value Ref Range   B Natriuretic Peptide 452.0 (H) 0.0 - 100.0 pg/mL  CBC with Differential     Status: Abnormal   Collection Time: 02/18/17 11:06 AM  Result Value Ref Range   WBC 14.6 (H) 4.0 - 10.5 K/uL   RBC 4.34 3.87 - 5.11 MIL/uL   Hemoglobin 12.6 12.0 - 15.0 g/dL   HCT 40.4 36.0 - 46.0 %   MCV 93.1 78.0 - 100.0 fL   MCH 29.0 26.0 - 34.0 pg   MCHC 31.2 30.0 - 36.0 g/dL   RDW 15.0 11.5 - 15.5 %   Platelets 267 150 - 400 K/uL   Neutrophils Relative % 78 %   Neutro Abs 11.3 (H) 1.7 - 7.7 K/uL   Lymphocytes Relative 14 %   Lymphs Abs 2.0 0.7 -  4.0 K/uL   Monocytes Relative 8 %   Monocytes Absolute 1.2 (H) 0.1 - 1.0 K/uL   Eosinophils Relative 0 %   Eosinophils Absolute 0.1 0.0 - 0.7 K/uL   Basophils Relative 0 %   Basophils Absolute 0.0 0.0 - 0.1 K/uL  Protime-INR     Status: Abnormal   Collection Time: 02/18/17 11:06 AM  Result Value Ref Range   Prothrombin Time 17.5 (H) 11.4 - 15.2 seconds   INR 1.43   I-stat troponin, ED     Status: None   Collection Time: 02/18/17 11:07 AM  Result Value Ref Range   Troponin i, poc 0.00 0.00 - 0.08 ng/mL   Comment 3            Comment: Due to the release kinetics of cTnI, a negative result within the first hours of the onset of symptoms does not rule out myocardial infarction with certainty. If myocardial infarction is still suspected, repeat the test at appropriate intervals.   I-Stat CG4 Lactic Acid, ED     Status: None   Collection Time: 02/18/17 11:09 AM  Result Value Ref Range   Lactic Acid, Venous 1.83 0.5 - 1.9 mmol/L   Magnesium     Status: Abnormal   Collection Time: 02/18/17 11:11 AM  Result Value Ref Range   Magnesium 1.6 (L) 1.7 - 2.4 mg/dL  Hemoglobin A1c     Status: Abnormal   Collection Time: 02/18/17  5:34 PM  Result Value Ref Range   Hgb A1c MFr Bld 6.9 (H) 4.8 - 5.6 %    Comment: (NOTE)         Pre-diabetes: 5.7 - 6.4         Diabetes: >6.4         Glycemic control for adults with diabetes: <7.0    Mean Plasma Glucose 151 mg/dL    Comment: (NOTE) Performed At: [facility] [place], [place] [id number] Lindon Romp MD YJ:[id number]   MRSA PCR Screening     Status: None   Collection Time: 02/18/17  6:15 PM  Result Value Ref Range   MRSA by PCR NEGATIVE NEGATIVE    Comment:        The GeneXpert MRSA Assay (FDA approved for NASAL specimens only), is one component of a comprehensive MRSA colonization surveillance program. It is not intended to diagnose MRSA infection nor to guide or monitor treatment for MRSA infections.   Glucose, capillary     Status: Abnormal   Collection Time: 02/18/17  6:24 PM  Result Value Ref Range   Glucose-Capillary 172 (H) 65 - 99 mg/dL  Glucose, capillary     Status: Abnormal   Collection Time: 02/18/17  7:47 PM  Result Value Ref Range   Glucose-Capillary 161 (H) 65 - 99 mg/dL  Urine rapid drug screen (hosp performed)     Status: Abnormal   Collection Time: 02/18/17  8:32 PM  Result Value Ref Range   Opiates NONE DETECTED NONE DETECTED   Cocaine NONE DETECTED NONE DETECTED   Benzodiazepines POSITIVE (A) NONE DETECTED   Amphetamines NONE DETECTED NONE DETECTED   Tetrahydrocannabinol NONE DETECTED NONE DETECTED   Barbiturates NONE DETECTED NONE DETECTED    Comment:        DRUG SCREEN FOR MEDICAL PURPOSES ONLY.  IF CONFIRMATION IS NEEDED FOR ANY PURPOSE, NOTIFY LAB WITHIN 5 DAYS.        LOWEST DETECTABLE LIMITS FOR URINE DRUG SCREEN Drug Class  Cutoff (ng/mL) Amphetamine      1000 Barbiturate       200 Benzodiazepine   835 Tricyclics       075 Opiates          300 Cocaine          300 THC              50   Procalcitonin - Baseline     Status: None   Collection Time: 02/18/17  8:50 PM  Result Value Ref Range   Procalcitonin <0.10 ng/mL    Comment:        Interpretation: PCT (Procalcitonin) <= 0.5 ng/mL: Systemic infection (sepsis) is not likely. Local bacterial infection is possible. (NOTE)         ICU PCT Algorithm               Non ICU PCT Algorithm    ----------------------------     ------------------------------         PCT < 0.25 ng/mL                 PCT < 0.1 ng/mL     Stopping of antibiotics            Stopping of antibiotics       strongly encouraged.               strongly encouraged.    ----------------------------     ------------------------------       PCT level decrease by               PCT < 0.25 ng/mL       >= 80% from peak PCT       OR PCT 0.25 - 0.5 ng/mL          Stopping of antibiotics                                             encouraged.     Stopping of antibiotics           encouraged.    ----------------------------     ------------------------------       PCT level decrease by              PCT >= 0.25 ng/mL       < 80% from peak PCT        AND PCT >= 0.5 ng/mL            Continuin g antibiotics                                              encouraged.       Continuing antibiotics            encouraged.    ----------------------------     ------------------------------     PCT level increase compared          PCT > 0.5 ng/mL         with peak PCT AND          PCT >= 0.5 ng/mL             Escalation of antibiotics  strongly encouraged.      Escalation of antibiotics        strongly encouraged.   HIV antibody     Status: None   Collection Time: 02/18/17  8:50 PM  Result Value Ref Range   HIV Screen 4th Generation wRfx Non Reactive Non Reactive    Comment: (NOTE) Performed At: [facility] [place], [place] [id number] Lindon Romp MD AJ:[id number]   Blood gas, arterial     Status: Abnormal   Collection Time: 02/18/17  9:10 PM  Result Value Ref Range   FIO2 50.00    Delivery systems BILEVEL POSITIVE AIRWAY PRESSURE    LHR 8.0 resp/min   Inspiratory PAP 18    Expiratory PAP 8    pH, Arterial 7.344 (L) 7.350 - 7.450   pCO2 arterial 53.4 (H) 32.0 - 48.0 mmHg   pO2, Arterial 87.8 83.0 - 108.0 mmHg   Bicarbonate 26.5 20.0 - 28.0 mmol/L   Acid-Base Excess 3.0 (H) 0.0 - 2.0 mmol/L   O2 Saturation 95.8 %   Patient temperature 37.0    Collection site RIGHT RADIAL    Drawn by [id number]    Sample type ARTERIAL DRAW    Allens test (pass/fail) PASS PASS  Digoxin level     Status: Abnormal   Collection Time: 02/18/17 10:00 PM  Result Value Ref Range   Digoxin Level 0.5 (L) 0.8 - 2.0 ng/mL  Glucose, capillary     Status: Abnormal   Collection Time: 02/19/17 12:18 AM  Result Value Ref Range   Glucose-Capillary 158 (H) 65 - 99 mg/dL  Glucose, capillary     Status: Abnormal   Collection Time: 02/19/17  4:33 AM  Result Value Ref Range   Glucose-Capillary 130 (H) 65 - 99 mg/dL  Basic metabolic panel     Status: Abnormal   Collection Time: 02/19/17  4:52 AM  Result Value Ref Range   Sodium 136 135 - 145 mmol/L   Potassium 4.4 3.5 - 5.1 mmol/L   Chloride 96 (L) 101 - 111 mmol/L   CO2 32 22 - 32 mmol/L   Glucose, Bld 136 (H) 65 - 99 mg/dL   BUN 9 6 - 20 mg/dL   Creatinine, Ser 0.42 (L) 0.44 - 1.00 mg/dL   Calcium 9.0 8.9 - 10.3 mg/dL   GFR calc non Af Amer >60 >60 mL/min   GFR calc Af Amer >60 >60 mL/min    Comment: (NOTE) The eGFR has been calculated using the CKD EPI equation. This calculation has not been validated in all clinical situations. eGFR's persistently <60 mL/min signify possible Chronic Kidney Disease.    Anion gap 8 5 - 15  CBC     Status: Abnormal   Collection Time: 02/19/17  4:52 AM  Result Value Ref Range   WBC 8.3 4.0 - 10.5  K/uL   RBC 3.54 (L) 3.87 - 5.11 MIL/uL   Hemoglobin 10.6 (L) 12.0 - 15.0 g/dL   HCT 32.6 (L) 36.0 - 46.0 %   MCV 92.1 78.0 - 100.0 fL   MCH 29.9 26.0 - 34.0 pg   MCHC 32.5 30.0 - 36.0 g/dL   RDW 14.9 11.5 - 15.5 %   Platelets 200 150 - 400 K/uL  Procalcitonin     Status: None   Collection Time: 02/19/17  4:52 AM  Result Value Ref Range   Procalcitonin <0.10 ng/mL    Comment:        Interpretation: PCT (Procalcitonin) <= 0.5  ng/mL: Systemic infection (sepsis) is not likely. Local bacterial infection is possible. (NOTE)         ICU PCT Algorithm               Non ICU PCT Algorithm    ----------------------------     ------------------------------         PCT < 0.25 ng/mL                 PCT < 0.1 ng/mL     Stopping of antibiotics            Stopping of antibiotics       strongly encouraged.               strongly encouraged.    ----------------------------     ------------------------------       PCT level decrease by               PCT < 0.25 ng/mL       >= 80% from peak PCT       OR PCT 0.25 - 0.5 ng/mL          Stopping of antibiotics                                             encouraged.     Stopping of antibiotics           encouraged.    ----------------------------     ------------------------------       PCT level decrease by              PCT >= 0.25 ng/mL       < 80% from peak PCT        AND PCT >= 0.5 ng/mL            Continuin g antibiotics                                              encouraged.       Continuing antibiotics            encouraged.    ----------------------------     ------------------------------     PCT level increase compared          PCT > 0.5 ng/mL         with peak PCT AND          PCT >= 0.5 ng/mL             Escalation of antibiotics                                          strongly encouraged.      Escalation of antibiotics        strongly encouraged.   Magnesium     Status: Abnormal   Collection Time: 02/19/17  4:52 AM  Result Value  Ref Range   Magnesium 1.2 (L) 1.7 - 2.4 mg/dL  Glucose, capillary     Status: Abnormal   Collection Time: 02/19/17  7:39 AM  Result Value Ref Range   Glucose-Capillary 133 (H) 65 - 99 mg/dL  Glucose, capillary  Status: Abnormal   Collection Time: 02/19/17 11:50 AM  Result Value Ref Range   Glucose-Capillary 195 (H) 65 - 99 mg/dL  Glucose, capillary     Status: Abnormal   Collection Time: 02/19/17  5:02 PM  Result Value Ref Range   Glucose-Capillary 208 (H) 65 - 99 mg/dL   Comment 1 Notify RN    Comment 2 Document in Chart   Glucose, capillary     Status: Abnormal   Collection Time: 02/20/17 12:06 AM  Result Value Ref Range   Glucose-Capillary 338 (H) 65 - 99 mg/dL   Comment 1 Notify RN    Comment 2 Document in Chart   Procalcitonin     Status: None   Collection Time: 02/20/17  5:08 AM  Result Value Ref Range   Procalcitonin <0.10 ng/mL    Comment:        Interpretation: PCT (Procalcitonin) <= 0.5 ng/mL: Systemic infection (sepsis) is not likely. Local bacterial infection is possible. (NOTE)         ICU PCT Algorithm               Non ICU PCT Algorithm    ----------------------------     ------------------------------         PCT < 0.25 ng/mL                 PCT < 0.1 ng/mL     Stopping of antibiotics            Stopping of antibiotics       strongly encouraged.               strongly encouraged.    ----------------------------     ------------------------------       PCT level decrease by               PCT < 0.25 ng/mL       >= 80% from peak PCT       OR PCT 0.25 - 0.5 ng/mL          Stopping of antibiotics                                             encouraged.     Stopping of antibiotics           encouraged.    ----------------------------     ------------------------------       PCT level decrease by              PCT >= 0.25 ng/mL       < 80% from peak PCT        AND PCT >= 0.5 ng/mL            Continuin g antibiotics                                               encouraged.       Continuing antibiotics            encouraged.    ----------------------------     ------------------------------     PCT level increase compared          PCT > 0.5 ng/mL         with peak PCT  AND          PCT >= 0.5 ng/mL             Escalation of antibiotics                                          strongly encouraged.      Escalation of antibiotics        strongly encouraged.   CBC with Differential/Platelet     Status: Abnormal   Collection Time: 02/20/17  5:08 AM  Result Value Ref Range   WBC 15.9 (H) 4.0 - 10.5 K/uL   RBC 3.37 (L) 3.87 - 5.11 MIL/uL   Hemoglobin 9.9 (L) 12.0 - 15.0 g/dL   HCT 30.6 (L) 36.0 - 46.0 %   MCV 90.8 78.0 - 100.0 fL   MCH 29.4 26.0 - 34.0 pg   MCHC 32.4 30.0 - 36.0 g/dL   RDW 14.9 11.5 - 15.5 %   Platelets 220 150 - 400 K/uL   Neutrophils Relative % 83 %   Neutro Abs 13.1 (H) 1.7 - 7.7 K/uL   Lymphocytes Relative 7 %   Lymphs Abs 1.2 0.7 - 4.0 K/uL   Monocytes Relative 10 %   Monocytes Absolute 1.6 (H) 0.1 - 1.0 K/uL   Eosinophils Relative 0 %   Eosinophils Absolute 0.0 0.0 - 0.7 K/uL   Basophils Relative 0 %   Basophils Absolute 0.0 0.0 - 0.1 K/uL  Comprehensive metabolic panel     Status: Abnormal   Collection Time: 02/20/17  5:08 AM  Result Value Ref Range   Sodium 136 135 - 145 mmol/L   Potassium 4.4 3.5 - 5.1 mmol/L   Chloride 97 (L) 101 - 111 mmol/L   CO2 33 (H) 22 - 32 mmol/L   Glucose, Bld 181 (H) 65 - 99 mg/dL   BUN 19 6 - 20 mg/dL   Creatinine, Ser 0.54 0.44 - 1.00 mg/dL   Calcium 9.2 8.9 - 10.3 mg/dL   Total Protein 5.9 (L) 6.5 - 8.1 g/dL   Albumin 3.1 (L) 3.5 - 5.0 g/dL   AST 17 15 - 41 U/L   ALT 14 14 - 54 U/L   Alkaline Phosphatase 50 38 - 126 U/L   Total Bilirubin 0.3 0.3 - 1.2 mg/dL   GFR calc non Af Amer >60 >60 mL/min   GFR calc Af Amer >60 >60 mL/min    Comment: (NOTE) The eGFR has been calculated using the CKD EPI equation. This calculation has not been validated in all clinical  situations. eGFR's persistently <60 mL/min signify possible Chronic Kidney Disease.    Anion gap 6 5 - 15  Magnesium     Status: None   Collection Time: 02/20/17  5:08 AM  Result Value Ref Range   Magnesium 1.9 1.7 - 2.4 mg/dL  Phosphorus     Status: None   Collection Time: 02/20/17  5:08 AM  Result Value Ref Range   Phosphorus 2.7 2.5 - 4.6 mg/dL  Glucose, capillary     Status: Abnormal   Collection Time: 02/20/17  5:11 AM  Result Value Ref Range   Glucose-Capillary 169 (H) 65 - 99 mg/dL  Blood gas, arterial     Status: Abnormal   Collection Time: 02/20/17  5:30 AM  Result Value Ref Range   FIO2 28.00    O2 Content 2.0 L/min  Delivery systems NASAL CANNULA    pH, Arterial 7.398 7.350 - 7.450   pCO2 arterial 56.4 (H) 32.0 - 48.0 mmHg   pO2, Arterial 85.6 83.0 - 108.0 mmHg   Bicarbonate 32.2 (H) 20.0 - 28.0 mmol/L   Acid-Base Excess 9.1 (H) 0.0 - 2.0 mmol/L   O2 Saturation 96.1 %   Collection site RIGHT RADIAL    Drawn by [id number]    Sample type ARTERIAL    Allens test (pass/fail) PASS PASS  Glucose, capillary     Status: Abnormal   Collection Time: 02/20/17  7:36 AM  Result Value Ref Range   Glucose-Capillary 161 (H) 65 - 99 mg/dL    ABGS  Recent Labs  02/20/17 0530  PHART 7.398  PO2ART 85.6  HCO3 32.2*   CULTURES Recent Results (from the past 240 hour(s))  MRSA PCR Screening     Status: None   Collection Time: 02/18/17  6:15 PM  Result Value Ref Range Status   MRSA by PCR NEGATIVE NEGATIVE Final    Comment:        The GeneXpert MRSA Assay (FDA approved for NASAL specimens only), is one component of a comprehensive MRSA colonization surveillance program. It is not intended to diagnose MRSA infection nor to guide or monitor treatment for MRSA infections.    Studies/Results: Ct Angio Chest Pe W And/or Wo Contrast  Result Date: 02/18/2017 CLINICAL DATA:  Chest pain and shortness of breath EXAM: CT ANGIOGRAPHY CHEST WITH CONTRAST TECHNIQUE: Multidetector  CT imaging of the chest was performed using the standard protocol during bolus administration of intravenous contrast. Multiplanar CT image reconstructions and MIPs were obtained to evaluate the vascular anatomy. CONTRAST:  100 mL Isovue 370 nonionic COMPARISON:  Chest radiograph February 18, 2017 FINDINGS: Cardiovascular: There is no demonstrable pulmonary embolus. There is no thoracic aortic aneurysm or dissection. There is moderate atherosclerotic calcification in the proximal visualized great vessels. There is extensive calcification in the aorta. There are multiple foci of native coronary artery calcification. The patient is status post coronary artery bypass grafting. Pericardium is not appreciably thickened. Mediastinum/Nodes: Thyroid appears normal. There are multiple subcentimeter mediastinal lymph nodes. There is an enlarged lymph node anterior to the carina measuring 2.3 x 1.8 cm. There is sub- carinal adenopathy measuring 3.2 x 2.2 cm. There is an enlarged lymph node in the right hilum measuring 1.7 x 1.1 cm. There is a small hiatal hernia. Lungs/Pleura: On axial slice 52 series 8, there is a 5 mm nodular opacity in the anterior segment of the left upper lobe. There is a somewhat irregular masslike area in the superior segment right lower lobe measuring 1.3 x 1.1 x 0.9 cm, best seen on axial slice 57 series 8 and coronal slice 676 series 10. There is a nodular opacity in the posterior segment of the right upper lobe on axial slice 59 series 8 measuring 4 mm. There is a nodular lesion in the superior segment right lower lobe seen on axial slice 64 series 8 measuring 8 x 5 mm. There is a nodular lesion also in the superior segment of the right lower lobe on axial slice 63 series 8 measuring 0.9 x 0.8 cm. There is a 3 mm nodular opacity in the lateral segment of the left lower lobe on axial slice 63 series 8. There is a 5 mm nodular opacity in the posterior aspect of the superior segment of the left lower lobe  seen on axial slice 67 series 8. There are multiple scattered  1-2 mm nodular opacities in the upper lobes bilaterally. There is atelectatic change in the anterior lung bases bilaterally. There is no frank airspace consolidation. No pleural effusion or pleural thickening. Upper Abdomen: There is reflux of contrast into the inferior vena cava and hepatic veins. Visualized upper abdominal structures otherwise appear unremarkable. Musculoskeletal: Patient is status post median sternotomy. There are no blastic or lytic bone lesions. Review of the MIP images confirms the above findings. IMPRESSION: No demonstrable pulmonary embolus. Reflux of contrast into the inferior vena cava and hepatic veins may well represent a degree of increased right heart pressure. There are areas of adenopathy, most pronounced in the precarinal and sub- carinal regions. There are nodular opacities in the lungs, largest measuring 1.3 x 1.1 x 0.9 cm in the superior segment right lower lobe. This combination of findings is concerning for neoplasm. Advise PET-CT to further evaluate. No frank edema or consolidation. Extensive atherosclerotic calcification. Patient is status post coronary artery bypass grafting. Electronically Signed   By: Lowella Grip III M.D.   On: 02/18/2017 15:19   Dg Chest Portable 1 View  Result Date: 02/18/2017 CLINICAL DATA:  66 year old presenting with a 6 day history of productive cough, chest congestion and shortness of breath. Current history of COPD. Prior CABG. Hypoxia noted while in the emergency department. EXAM: PORTABLE CHEST 1 VIEW COMPARISON:  11/04/2016, 09/17/2016 and earlier. FINDINGS: Sternotomy for CABG. Cardiac silhouette upper normal in size for AP portable technique, unchanged. Left atrial appendage clip. Mildly prominent bronchovascular markings diffusely, unchanged. Lungs otherwise clear. No localized airspace consolidation. No pleural effusions. No pneumothorax. Normal pulmonary vascularity.  IMPRESSION: No acute cardiopulmonary disease. Electronically Signed   By: Evangeline Dakin M.D.   On: 02/18/2017 11:35    Medications:  Prior to Admission:  Prescriptions Prior to Admission  Medication Sig Dispense Refill Last Dose  . ACCU-CHEK AVIVA PLUS test strip USE TO TEST 3 TO 4 TIMES DAILY OR AS DIRECTED. 100 each PRN Taking  . acetaminophen (TYLENOL) 500 MG tablet Take 500 mg by mouth every 6 (six) hours as needed for mild pain.   Taking  . albuterol (PROAIR HFA) 108 (90 BASE) MCG/ACT inhaler Inhale 2 puffs into the lungs every 6 (six) hours as needed for wheezing or shortness of breath. 18 g 11 02/18/2017 at Unknown time  . ALPRAZolam (XANAX) 1 MG tablet Take 1 mg by mouth 4 (four) times daily.    02/18/2017 at Unknown time  . aspirin EC 81 MG tablet Take 81 mg by mouth daily.   02/18/2017 at Unknown time  . atorvastatin (LIPITOR) 40 MG tablet Take 1 tablet (40 mg total) by mouth daily. KEEP OV. 90 tablet 1 02/18/2017 at Unknown time  . DIGOX 125 MCG tablet TAKE ONE TABLET BY MOUTH ONCE DAILY. 30 tablet 10 02/18/2017 at Unknown time  . fluticasone (FLONASE) 50 MCG/ACT nasal spray Place 2 sprays into both nostrils daily. (Patient taking differently: Place 2 sprays into both nostrils daily as needed for allergies. ) 16 g 6 Taking  . furosemide (LASIX) 40 MG tablet TAKE ONE TABLET BY MOUTH ONCE DAILY. 30 tablet 6 02/18/2017 at Unknown time  . gabapentin (NEURONTIN) 100 MG capsule Take 1 capsule by mouth 3 (three) times daily.   02/18/2017 at Unknown time  . glipiZIDE (GLUCOTROL XL) 2.5 MG 24 hr tablet 2.5 mg daily.    02/18/2017 at Unknown time  . JANUVIA 100 MG tablet TAKE ONE TABLET BY MOUTH DAILY. 30 tablet 0 02/18/2017 at Unknown  time  . lisinopril (PRINIVIL,ZESTRIL) 2.5 MG tablet Take 1 tablet (2.5 mg total) by mouth daily. 30 tablet 11 02/18/2017 at Unknown time  . Magnesium Oxide 250 MG TABS Take 250 mg by mouth daily.    02/18/2017 at Unknown time  . metFORMIN (GLUCOPHAGE) 850 MG tablet TAKE  1 TABLET BY MOUTH TWICE DAILY WITH MEALS. 60 tablet 0 02/18/2017 at Unknown time  . metoprolol (LOPRESSOR) 50 MG tablet Take 2 tablets in the morning and 1 1/2 tablets at night 105 tablet 11 02/18/2017 at 700  . montelukast (SINGULAIR) 10 MG tablet Take 1 tablet by mouth daily.   02/18/2017 at Unknown time  . Multiple Vitamin (MULTIVITAMIN) capsule Take 1 capsule by mouth daily.   02/18/2017 at Unknown time  . potassium chloride SA (K-DUR,KLOR-CON) 20 MEQ tablet Take 1 tablet (20 mEq total) by mouth daily. 30 tablet 0 02/18/2017 at Unknown time  . PRADAXA 150 MG CAPS capsule TAKE ONE CAPSULE BY MOUTH TWICE DAILY. 60 capsule 10 02/18/2017 at 700  . sodium chloride (OCEAN) 0.65 % nasal spray Place 1 spray into the nose as needed for congestion. Congestion   Taking  . SYMBICORT 160-4.5 MCG/ACT inhaler INHALE 2 PUFFS INTO THE LUNGS TWICE DAILY. RINSE MOUTH AFTER USE. 10.2 g 0 02/18/2017 at Unknown time   Scheduled: . ALPRAZolam  1 mg Oral QID  . aspirin EC  81 mg Oral Daily  . atorvastatin  40 mg Oral q1800  . dabigatran  150 mg Oral BID  . dextromethorphan-guaiFENesin  1 tablet Oral BID  . digoxin  125 mcg Oral Daily  . feeding supplement (GLUCERNA SHAKE)  237 mL Oral BID BM  . gabapentin  100 mg Oral TID  . insulin aspart  0-15 Units Subcutaneous TID AC & HS  . ipratropium  0.5 mg Nebulization Q6H  . levalbuterol  0.63 mg Nebulization Q6H  . lisinopril  2.5 mg Oral Daily  . magnesium oxide  200 mg Oral Daily  . mouth rinse  15 mL Mouth Rinse BID  . methylPREDNISolone (SOLU-MEDROL) injection  80 mg Intravenous Q12H  . metoprolol tartrate  100 mg Oral Daily   And  . metoprolol tartrate  75 mg Oral QHS  . mometasone-formoterol  2 puff Inhalation BID  . montelukast  10 mg Oral Daily   Continuous: . azithromycin Stopped (02/19/17 1951)   CWC:[id number] **OR** acetaminophen, albuterol, fluticasone, ondansetron **OR** ondansetron (ZOFRAN) IV, sodium chloride  Assesment: She was admitted with  acute on chronic hypoxic respiratory failure with COPD exacerbation. She required BiPAP initially but she is much better now. She is on nasal cannula. She is coughing of sputum. At baseline she has chronic diastolic heart failure and I think that's pretty stable and she has had atrial fibrillation rapid ventricular response.She has abnormalities on CT that could be a lung cancer Principal Problem:   Acute on chronic respiratory failure with hypoxia (HCC) Active Problems:   Diabetes mellitus (HCC)   Diastolic congestive heart failure (HCC)   Atrial fibrillation with rapid ventricular response (HCC)   COPD with acute exacerbation (HCC)   Hypomagnesemia   Malnutrition of moderate degree    Plan: Continue current treatments. She is improving    LOS: 2 days   Jarmel Linhardt L 02/20/2017, 8:35 AM

## 2017-02-20 NOTE — Progress Notes (Signed)
Verified Cardizem gtt order due to notes that wern't clear weather or not pt should be on the gtt.will follow-up with a.m. MD to see about P.O. cardizem per previous note. No s/s of resp distress, is now on [place] 3L, not on bipap at this time. MD discontinued cardizem gtt order. RN to take foley catheter out due to pt ability to get up with assistance to commode in room as well as continent of urine & bowel,pt is no longer unstable which was the indication for foley. Pt is in good spirits,denies pain, bathed with soap & water w/ full linen change, neuro wise intact A & O x4. Sats are upper 90's. HR between 70-100(Goal). BP 121/63. Saline locked at this time. RN modified MAR insulin coverage sliding scale order to match MD orders for AC&HS(4x daily cbg cks). RN will continue to monitor for any new changes.   Ericka Pontiff, RN 2:36 AM 02/20/17

## 2017-02-20 NOTE — Progress Notes (Signed)
PROGRESS NOTE    Mary [name]  VEH:[id number] DOB: Nov 18, 1950 DOA: 02/18/2017 PCP: Wende Neighbors, MD   Brief Narrative:  Mary [name] is a 66 y.o. female with medical history significant of COPD not apparently on home O2; chronic systolic HF (EF 83-66% in 2015), afib on Pradaxa; DM; and anxiety presenting to the hospital with worsening SOB.  Symptoms started last Friday.  She was hospitalized for SOB from 1/10-12 for apparent COPD exacerbation and/or mild CHF exacerbation (did not require BIPAP).  She was treated with IV Lasix, steroids, and azithromycin.She was unable to provide further history at the time of admission due to somnolence on BIPAP. Per report she had SOB associated with a cough for the past 6 days and had been taking tylenol and her inhaler without relief. Had reported she had a fever and CP but on my evaluation today she states it is from when she has been coughing so much. Still smokes occasional Cigarettes. Was admitted to SDU for COPD Exacerbation and placed on BiPAP and is now weaned off BiPAP and improving. This AM she states she is not able to cough up her sputum as well but feels slightly better.   Assessment & Plan:   Principal Problem:   Acute on chronic respiratory failure with hypoxia (HCC) Active Problems:   Diabetes mellitus (HCC)   Diastolic congestive heart failure (HCC)   Atrial fibrillation with rapid ventricular response (HCC)   COPD with acute exacerbation (HCC)   Hypomagnesemia   Malnutrition of moderate degree  Acute Hypercapnic Hypoxic Respiratory Failure requiring NIPPV with BiPAP likely 2/2 to Acute Exacerbation of COPD, improved and now on O2 via [place] -O2 Saturations in ED were in the 60'S -Weaned BiPAP and transitioned to O2 via [place]; Now Saturating well on 2 liters of  -Continuous Pulse Oximetry and Maintain O2 Saturations > 92% -ABG this AM showed 7.398/56.4/85.6/32.2/96.1% -Check Respiratory Virus Panel Negative; Removed Droplet Precautions -Repeat  CXR this AM showed The lungs are hyperinflated likely secondary to COPD. There is mild right basilar atelectasis. There is no pleural effusion or pneumothorax. The heart and mediastinum are normal. 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 There is thoracic aortic atherosclerosis  Acute Exacerbation of COPD -As above -Checked Respiratory Virus Panel and was Negative -C/w IV Methylprednisolone 80 mg BID -C/w IV Azithromycin  500 mg q24h -DuoNebs changed to Xopenex/Atrovent q6h because of RVR and Added Symibort Substitution of Dulera  -C/w Montelukast 10 mg po Daily -C/w Mucinex DM 1 tab po BID  -Discussed with Dr. 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 -Was intially started on Cardizem gtt and stopped; Continue to Monitor for need of it -C/w Digoxin 125 mcg po Daily -C/w 100 mg of Metoprolol Daily and 75 mg of Metoprolol qhs  DM -Hold oral hypoglycemics -Repeat HbA1c was 6.9, the last was 8.6 in 4/16 -Will change Moderate Novolog SSI q4h to Resistent Novolog SSI AC HS -CBG's ranging from 161-319  Hypomagnesemia -Magnesium was 1.6 on Admission; Repeat Mag Level yesterdayAM was 1.2 -> Mag level now 1.9 -Replete IV Mag Sulfate 4 grams yesterday -C/w Mag Oxide 200 mg po Daily -Repeat Mag Level in AM  Chronic Systolic Heart Failure with EF of 30-35% -Currently Euvolemic and  Compensated -C/w Metoprolol 100 mg Daily and Metoprolol 75 mg po qHs -C/w Lisinopril 2.5 mg po Daily  Hypertension -Currently well controlled -C/w 100 mg of Metoprolol Daily and 75 mg of Metoprolol qhs -C/w Lisinopril  2.5 mg po Daily  Hyperlipidemia -C/w Atorvastatin 40 mg po qHS  Anxiety  -C/w Alprazolam 1 mg po 4 times daily  Leukocytosis -WBC went from 14.6 -> 8.3  -> 15.9 -Likely induced by Steroids -IVF now D/C'd  Moderate Protein Calorie Malnutrition -Dietician/Nutritionist consult appreciated -C/w Glucerna 237 mL po BID between meals  Nodular Lung Opacities -CT Scan showed There are areas of adenopathy, most pronounced in the precarinal and sub- carinal regions. There are nodular opacities in the lungs, largest measuring 1.3 x 1.1 x 0.9 cm in the superior segment right lower lobe. This combination of findings is concerning for neoplasm -Will need Outpatient PET-CT to further evaluate when out of the hospital   DVT prophylaxis: Anticoagulated with Dabigatran 150 mg po BID Code Status: FULL CODE Family Communication: No family present at bedside Disposition Plan: [place] [facility] when medically stable  Consultants:   None   Procedures: None  Antimicrobials:  Anti-infectives    Start     Dose/Rate Route Frequency Ordered Stop   02/18/17 1530  azithromycin (ZITHROMAX) 500 mg in dextrose 5 % 250 mL IVPB     500 mg 250 mL/hr over 60 Minutes Intravenous Every 24 hours 02/18/17 1526       Subjective: Seen and examined and states she was feeling slightly better but not able to expectorate a lot of sputum. No nausea or vomiting. Denied any CP. Feels improved since yesterday.   Objective: Vitals:   02/20/17 0929 02/20/17 1149 02/20/17 1520 02/20/17 1625  BP: (!) 135/102     Pulse: (!) 124 88  92  Resp:  15  (!) 27  Temp:  98.2 F (36.8 C)  97.9 F (36.6 C)  TempSrc:  Oral  Oral  SpO2:  96% 96% 96%  Weight:      Height:        Intake/Output Summary (Last 24 hours) at 02/20/17 1915 Last data filed at 02/20/17 1400  Gross per 24 hour  Intake              720 ml  Output             1650 ml  Net             -930 ml   Filed Weights   02/18/17 1822 02/19/17 0500 02/20/17 0400  Weight: 60 kg (132 lb 4.4 oz) 58.7 kg (129 lb 6.6 oz) 57.7 kg (127 lb 3.3 oz)   Examination: Physical Exam:  Constitutional: NAD and appears calm and  comfortable appearing and not as dyspenic Eyes: Lids and conjunctivae normal, sclerae anicteric  ENMT: External Ears, Nose appear normal. Grossly normal hearing. Mucous membranes are moist.  Neck: Appears normal, supple, no cervical masses, normal ROM, no appreciable thyromegaly Respiratory: Diminished to auscultation bilaterally with some wheezing and rhonchi. No appreciable crackles or rales. Increased respiratory effort but no accessory muscle use. Patient wearing O2 via [place] Cardiovascular: Irregularly Irregular and tachy, Slight 2/6 Systolic murmur. No rubs / gallops. S1 and S2 auscultated. No extremity edema.  Chest Wall: Midline CABG incision noted Abdomen: Soft, non-tender, non-distended. No masses palpated. No appreciable hepatosplenomegaly. Bowel sounds positive x4.  GU: Deferred. Musculoskeletal: No clubbing / cyanosis of digits/nails. No joint deformity upper and lower extremities.  Skin: No  rashes, lesions, ulcers on limited skin evaluation. No induration; Warm and dry.  Neurologic: CN 2-12 grossly intact with no focal deficits. Romberg sign cerebellar reflexes not assessed.  Psychiatric: Normal judgment and insight. Alert and oriented x 3. Normal mood and appropriate affect.   Data Reviewed: I have personally reviewed following labs and imaging studies  CBC:  Recent Labs Lab 02/18/17 1106 02/19/17 0452 02/20/17 0508  WBC 14.6* 8.3 15.9*  NEUTROABS 11.3*  --  13.1*  HGB 12.6 10.6* 9.9*  HCT 40.4 32.6* 30.6*  MCV 93.1 92.1 90.8  PLT 267 200 409   Basic Metabolic Panel:  Recent Labs Lab 02/18/17 1106 02/18/17 1111 02/19/17 0452 02/20/17 0508  NA 134*  --  136 136  K 5.0  --  4.4 4.4  CL 92*  --  96* 97*  CO2 31  --  32 33*  GLUCOSE 175*  --  136* 181*  BUN 11  --  9 19  CREATININE 0.61  --  0.42* 0.54  CALCIUM 10.0  --  9.0 9.2  MG  --  1.6* 1.2* 1.9  PHOS  --   --   --  2.7   GFR: Estimated Creatinine Clearance: 62.2 mL/min (by C-G formula based on SCr of  0.54 mg/dL). Liver Function Tests:  Recent Labs Lab 02/18/17 1106 02/20/17 0508  AST 28 17  ALT 19 14  ALKPHOS 80 50  BILITOT 0.8 0.3  PROT 8.6* 5.9*  ALBUMIN 4.6 3.1*    Recent Labs Lab 02/18/17 1106  LIPASE 32   No results for input(s): AMMONIA in the last 168 hours. Coagulation Profile:  Recent Labs Lab 02/18/17 1106  INR 1.43   Cardiac Enzymes: No results for input(s): CKTOTAL, CKMB, CKMBINDEX, TROPONINI in the last 168 hours. BNP (last 3 results) No results for input(s): PROBNP in the last 8760 hours. HbA1C:  Recent Labs  02/18/17 1734  HGBA1C 6.9*   CBG:  Recent Labs Lab 02/20/17 0006 02/20/17 0511 02/20/17 0736 02/20/17 1148 02/20/17 1624  GLUCAP 338* 169* 161* 319* 231*   Lipid Profile: No results for input(s): CHOL, HDL, LDLCALC, TRIG, CHOLHDL, LDLDIRECT in the last 72 hours. Thyroid Function Tests: No results for input(s): TSH, T4TOTAL, FREET4, T3FREE, THYROIDAB in the last 72 hours. Anemia Panel: No results for input(s): VITAMINB12, FOLATE, FERRITIN, TIBC, IRON, RETICCTPCT in the last 72 hours. Sepsis Labs:  Recent Labs Lab 02/18/17 1109 02/18/17 2050 02/19/17 0452 02/20/17 0508  PROCALCITON  --  <0.10 <0.10 <0.10  LATICACIDVEN 1.83  --   --   --     Recent Results (from the past 240 hour(s))  MRSA PCR Screening     Status: None   Collection Time: 02/18/17  6:15 PM  Result Value Ref Range Status   MRSA by PCR NEGATIVE NEGATIVE Final    Comment:        The GeneXpert MRSA Assay (FDA approved for NASAL specimens only), is one component of a comprehensive MRSA colonization surveillance program. It is not intended to diagnose MRSA infection nor to guide or monitor treatment for MRSA infections.   Respiratory Panel by PCR     Status: None   Collection Time: 02/19/17  4:00 PM  Result Value Ref Range Status   Adenovirus NOT DETECTED NOT DETECTED Final   Coronavirus 229E NOT DETECTED NOT DETECTED Final   Coronavirus HKU1 NOT  DETECTED NOT DETECTED Final   Coronavirus NL63 NOT DETECTED NOT DETECTED Final   Coronavirus OC43 NOT DETECTED NOT  DETECTED Final   Metapneumovirus NOT DETECTED NOT DETECTED Final   Rhinovirus / Enterovirus NOT DETECTED NOT DETECTED Final   Influenza A NOT DETECTED NOT DETECTED Final   Influenza B NOT DETECTED NOT DETECTED Final   Parainfluenza Virus 1 NOT DETECTED NOT DETECTED Final   Parainfluenza Virus 2 NOT DETECTED NOT DETECTED Final   Parainfluenza Virus 3 NOT DETECTED NOT DETECTED Final   Parainfluenza Virus 4 NOT DETECTED NOT DETECTED Final   Respiratory Syncytial Virus NOT DETECTED NOT DETECTED Final   Bordetella pertussis NOT DETECTED NOT DETECTED Final   Chlamydophila pneumoniae NOT DETECTED NOT DETECTED Final   Mycoplasma pneumoniae NOT DETECTED NOT DETECTED Final    Comment: Performed at [facility], [place] [address], [place], [place] [address]    Radiology Studies: Dg Chest 1 View  Result Date: 02/20/2017 CLINICAL DATA:  COPD EXAM: CHEST 1 VIEW COMPARISON:  02/18/2017 CT chest FINDINGS: The lungs are hyperinflated likely secondary to COPD. There is mild right basilar atelectasis. There is no pleural effusion or pneumothorax. The heart and mediastinum are normal. 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 There is thoracic aortic atherosclerosis. The osseous structures are unremarkable. IMPRESSION: No active disease. Electronically Signed   By: Kathreen Devoid   On: 02/20/2017 09:33   Scheduled Meds: . acetylcysteine  3 mL Nebulization Q6H  . ALPRAZolam  1 mg Oral QID  . aspirin EC  81 mg Oral Daily  . atorvastatin  40 mg Oral q1800  . dabigatran  150 mg Oral BID  . dextromethorphan-guaiFENesin  1 tablet Oral BID  . digoxin  125 mcg Oral Daily  . feeding supplement (GLUCERNA SHAKE)  237 mL Oral BID BM  . gabapentin  100 mg Oral TID  . insulin aspart  0-15 Units Subcutaneous TID AC & HS  . ipratropium  0.5 mg Nebulization Q6H  . levalbuterol  0.63 mg Nebulization Q6H  . lisinopril   2.5 mg Oral Daily  . magnesium oxide  200 mg Oral Daily  . mouth rinse  15 mL Mouth Rinse BID  . methylPREDNISolone (SOLU-MEDROL) injection  80 mg Intravenous Q12H  . metoprolol tartrate  100 mg Oral Daily   And  . metoprolol tartrate  75 mg Oral QHS  . mometasone-formoterol  2 puff Inhalation BID  . montelukast  10 mg Oral Daily   Continuous Infusions: . azithromycin Stopped (02/20/17 1630)    LOS: 2 days   Kerney Elbe, DO [facility] Pager [phone]  If 7PM-7AM, please contact night-coverage [email or web address] Password [facility] 02/20/2017, 7:15 PM

## 2017-02-21 LAB — COMPREHENSIVE METABOLIC PANEL
ALK PHOS: 51 U/L (ref 38–126)
ALT: 16 U/L (ref 14–54)
ANION GAP: 6 (ref 5–15)
AST: 15 U/L (ref 15–41)
Albumin: 3.3 g/dL — ABNORMAL LOW (ref 3.5–5.0)
BUN: 17 mg/dL (ref 6–20)
CALCIUM: 9.4 mg/dL (ref 8.9–10.3)
CHLORIDE: 96 mmol/L — AB (ref 101–111)
CO2: 33 mmol/L — AB (ref 22–32)
Creatinine, Ser: 0.46 mg/dL (ref 0.44–1.00)
Glucose, Bld: 312 mg/dL — ABNORMAL HIGH (ref 65–99)
Potassium: 4.9 mmol/L (ref 3.5–5.1)
Sodium: 135 mmol/L (ref 135–145)
Total Bilirubin: 0.3 mg/dL (ref 0.3–1.2)
Total Protein: 6.1 g/dL — ABNORMAL LOW (ref 6.5–8.1)

## 2017-02-21 LAB — GLUCOSE, CAPILLARY
GLUCOSE-CAPILLARY: 334 mg/dL — AB (ref 65–99)
GLUCOSE-CAPILLARY: 253 mg/dL — AB (ref 65–99)
Glucose-Capillary: 311 mg/dL — ABNORMAL HIGH (ref 65–99)
Glucose-Capillary: 403 mg/dL — ABNORMAL HIGH (ref 65–99)
Glucose-Capillary: 396 mg/dL — ABNORMAL HIGH (ref 65–99)
Glucose-Capillary: 111 mg/dL — ABNORMAL HIGH (ref 65–99)

## 2017-02-21 LAB — CBC WITH DIFFERENTIAL/PLATELET
Basophils Absolute: 0 10*3/uL (ref 0.0–0.1)
Basophils Relative: 0 %
EOS PCT: 0 %
Eosinophils Absolute: 0 10*3/uL (ref 0.0–0.7)
HCT: 31.6 % — ABNORMAL LOW (ref 36.0–46.0)
Hemoglobin: 10.2 g/dL — ABNORMAL LOW (ref 12.0–15.0)
LYMPHS ABS: 0.6 10*3/uL — AB (ref 0.7–4.0)
Lymphocytes Relative: 5 %
MCH: 29.8 pg (ref 26.0–34.0)
MCHC: 32.3 g/dL (ref 30.0–36.0)
MCV: 92.4 fL (ref 78.0–100.0)
MONO ABS: 0.2 10*3/uL (ref 0.1–1.0)
MONOS PCT: 2 %
Neutro Abs: 12.3 10*3/uL — ABNORMAL HIGH (ref 1.7–7.7)
Neutrophils Relative %: 93 %
PLATELETS: 220 10*3/uL (ref 150–400)
RBC: 3.42 MIL/uL — ABNORMAL LOW (ref 3.87–5.11)
RDW: 15.3 % (ref 11.5–15.5)
WBC: 13.1 10*3/uL — AB (ref 4.0–10.5)

## 2017-02-21 LAB — PHOSPHORUS: PHOSPHORUS: 3.1 mg/dL (ref 2.5–4.6)

## 2017-02-21 LAB — MAGNESIUM: MAGNESIUM: 1.7 mg/dL (ref 1.7–2.4)

## 2017-02-21 MED ORDER — METHYLPREDNISOLONE SODIUM SUCC 125 MG IJ SOLR
60.0000 mg | Freq: Four times a day (QID) | INTRAMUSCULAR | Status: DC
  Administered 2017-02-21 – 2017-02-22 (×5): 60 mg via INTRAVENOUS
  Filled 2017-02-21 (×5): qty 2

## 2017-02-21 MED ORDER — SODIUM CHLORIDE 3 % IN NEBU: 4.0000 mL | INHALATION_SOLUTION | Freq: Every day | RESPIRATORY_TRACT | Status: AC

## 2017-02-21 MED ORDER — INSULIN GLARGINE 100 UNIT/ML ~~LOC~~ SOLN
10.0000 [IU] | Freq: Every day | SUBCUTANEOUS | Status: DC
  Administered 2017-02-21: 10 [IU] via SUBCUTANEOUS
  Filled 2017-02-21 (×2): qty 0.1

## 2017-02-21 NOTE — Progress Notes (Signed)
Inpatient Diabetes Program Recommendations  AACE/ADA: New Consensus Statement on Inpatient Glycemic Control (2015)  Target Ranges:  Prepandial:   less than 140 mg/dL      Peak postprandial:   less than 180 mg/dL (1-2 hours)      Critically ill patients:  140 - 180 mg/dL   Review of Glycemic Control  Diabetes history: DM 2 Outpatient Diabetes medications: Glipizide 2.5 mg Daily, Januvia 100 mg Daily, Metformin 850 mg BID Current orders for Inpatient glycemic control: Lantus 10 units Daily, Novolog Resistant (0-20 units) tid, Novolog HS scale (0-5 units)  A1c 6.9% on 02/18/17  Inpatient Diabetes Program Recommendations:    IV Solumedrol dose being tapered from 80 to 60 mg Q6 hours. Agree with Lantus dose. Glucose trends should decrease with decrease in steroidal dose, however, if glucose continues to be elevated may consider the addition of meal coverage. As steroids are tapered, Lantus and meal coverage also will be tapered following glucose trend results.  Thanks,  Tama Headings RN, MSN, [facility] Inpatient Diabetes Coordinator Team Pager [phone] (8a-5p)

## 2017-02-21 NOTE — Progress Notes (Signed)
PROGRESS NOTE    Mary [name]  SJG:[id number] DOB: 1951-07-14 DOA: 02/18/2017 PCP: Wende Neighbors, MD   Brief Narrative:  Mary [name] is a 66 y.o. female with medical history significant of COPD not apparently on home O2; chronic systolic HF (EF 65-46% in 2015), afib on Pradaxa; DM; and anxiety presenting to the hospital with worsening SOB.  Symptoms started last Friday.  She was hospitalized for SOB from 1/10-12 for apparent COPD exacerbation and/or mild CHF exacerbation (did not require BIPAP).  She was treated with IV Lasix, steroids, and azithromycin.She was unable to provide further history at the time of admission due to somnolence on BIPAP. Per report she had SOB associated with a cough for the past 6 days and had been taking tylenol and her inhaler without relief. Had reported she had a fever and CP but on my evaluation today she states it is from when she has been coughing so much. Still smokes occasional Cigarettes. Was admitted to SDU for COPD Exacerbation and placed on BiPAP and is now weaned off BiPAP and improving. Had Some RVR but Metoprolol was held because of Hypotension. RVR is improved. This AM she states she is not able to cough up her sputum as well but feels slightly better. Will add Hypertonic Saline Nebs to help patient expectorate. Patient stable to transfer to the medical floor.   Assessment & Plan:   Principal Problem:   Acute on chronic respiratory failure with hypoxia (HCC) Active Problems:   Diabetes mellitus (HCC)   Diastolic congestive heart failure (HCC)   Atrial fibrillation with rapid ventricular response (HCC)   COPD with acute exacerbation (HCC)   Hypomagnesemia   Malnutrition of moderate degree  Acute Hypercapnic Hypoxic Respiratory Failure requiring NIPPV with BiPAP likely 2/2 to Acute Exacerbation of COPD, improved and now on O2 via [place] -O2 Saturations in ED were in the 60'S on Admission -Weaned BiPAP and transitioned to O2 via [place]; Now Saturating well on 2  liters of [place] -Continuous Pulse Oximetry and Maintain O2 Saturations > 92% -Checked Respiratory Virus Panel Negative; Removed Droplet Precautions -Repeat CXR 02/20/17 showed The lungs are hyperinflated likely secondary to COPD. There is mild right basilar atelectasis. There is no pleural effusion or pneumothorax. The heart and mediastinum are normal. 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 There is thoracic aortic atherosclerosis -Will need to continue to Wean O2 and have Walk Screen Prior to D/C -PT recommends Home Health PT  Acute Exacerbation of COPD -As above -Checked Respiratory Virus Panel and was Negative -Changed IV Methylprednisolone 80 mg BID to 60 mg q6h -C/w IV Azithromycin  500 mg q24h -DuoNebs changed to Xopenex/Atrovent q6h because of RVR and Added Symibort Substitution of Dulera  -C/w Montelukast 10 mg po Daily -C/w Mucinex DM 1 tab po BID  -Dr. Luan Pulling of Pulmonary  Consulted and following and he added Mucomyst Nebs 3 mL q6h yesterday -Will Add Hypertonic Saline Nebs 4 mL Daily x 3 days  Afib with RVR -Patient with known and Chronic Atrial Fib- Was in RVR this AM but improved with morning meds so Cardizem gtt was held -C/w Bedside Telemetry -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 -Was intially started on Cardizem gtt and stopped; Continue to Monitor for need of it -C/w Digoxin 125 mcg po Daily -C/w 100 mg of Metoprolol Daily and 75 mg of Metoprolol qhs  DM -Hold oral hypoglycemics -Repeat HbA1c was 6.9, the last was 8.6 in 4/16 -C/w Resistent Novolog SSI  AC HS -Added Lantus 10 units Daily -Consulted Diabetic Education Coordinator and appreciate Recc's  -CBG's ranging from 111-403  Hypomagnesemia, improved -Magnesium was 1.6 on Admission; Repeat Mag Level yesterdayAM was 1.2 -> Mag level now 1.7 -C/w Mag Oxide 200 mg po Daily -Repeat Mag Level in AM  Chronic Systolic Heart Failure with EF of 30-35% -Currently Euvolemic and Compensated -C/w  Metoprolol 100 mg Daily and Metoprolol 75 mg po qHs -C/w Lisinopril 2.5 mg po Daily  Hypertension -Currently well controlled -C/w 100 mg of Metoprolol Daily and 75 mg of Metoprolol qhs -C/w Lisinopril  2.5 mg po Daily  Hyperlipidemia -C/w Atorvastatin 40 mg po qHS  Anxiety  -C/w Alprazolam 1 mg po 4 times daily  Leukocytosis -WBC went from 14.6 -> 8.3 -> 15.9 -> 13.1 -Likely induced by Steroids -IVF now D/C'd  Moderate Protein Calorie Malnutrition -Dietician/Nutritionist consult appreciated -C/w Glucerna 237 mL po BID between meals  Nodular Lung Opacities -Patient admitted to having unintentional  -CT Scan showed There are areas of adenopathy, most pronounced in the precarinal and sub- carinal regions. There are nodular opacities in the lungs, largest measuring 1.3 x 1.1 x 0.9 cm in the superior segment right lower lobe. This combination of findings is concerning for neoplasm -Will need Outpatient PET-CT to further evaluate when out of the hospital   DVT prophylaxis: Anticoagulated with Dabigatran 150 mg po BID Code Status: FULL CODE Family Communication: No family present at bedside Disposition Plan: [facility] when medically stable; Transfer to Medical Floor as patient has improved.  Consultants:   None   Procedures: None  Antimicrobials:  Anti-infectives    Start     Dose/Rate Route Frequency Ordered Stop   02/18/17 1530  azithromycin (ZITHROMAX) 500 mg in dextrose 5 % 250 mL IVPB     500 mg 250 mL/hr over 60 Minutes Intravenous Every 24 hours 02/18/17 1526       Subjective: Seen and examined and states she was feeling slightly better but not able to expectorate a lot of sputum still and has a cough that is causing abdominal soreness. No nausea or vomiting. Denied any CP. Feels more improved since yesterday but still weak.   Objective: Vitals:   02/21/17 0500 02/21/17 0530 02/21/17 0600 02/21/17 0630  BP: 125/85  140/74   Pulse: 89 87 93 92  Resp: 17 20  17 17   Temp:      TempSrc:      SpO2: 93% 96% 96% 96%  Weight: 59.7 kg (131 lb 9.8 oz)     Height:        Intake/Output Summary (Last 24 hours) at 02/21/17 0807 Last data filed at 02/21/17 0734  Gross per 24 hour  Intake             1220 ml  Output              900 ml  Net              320 ml   Filed Weights   02/19/17 0500 02/20/17 0400 02/21/17 0500  Weight: 58.7 kg (129 lb 6.6 oz) 57.7 kg (127 lb 3.3 oz) 59.7 kg (131 lb 9.8 oz)   Examination: Physical Exam:  Constitutional: NAD and appears calm and comfortable appearing and not as dyspenic Eyes: Lids and conjunctivae normal, sclerae anicteric  ENMT: External Ears, Nose appear normal. Grossly normal hearing. Mucous membranes are moist.  Neck: Appears normal, supple, no cervical masses, normal ROM, no appreciable thyromegaly  Respiratory: Diminished to auscultation bilaterally with some wheezing and rhonchi. No appreciable crackles or rales. Increased respiratory effort but no accessory muscle use. Patient wearing O2 via [place] Cardiovascular: Irregularly Irregular and tachy, Slight 2/6 Systolic murmur. No rubs / gallops. S1 and S2 auscultated. No extremity edema.  Chest Wall: Midline CABG incision noted Abdomen: Soft, non-tender, non-distended. No masses palpated. No appreciable hepatosplenomegaly. Bowel sounds positive x4.  GU: Deferred. Musculoskeletal: No clubbing / cyanosis of digits/nails. No joint deformity upper and lower extremities.  Skin: No rashes, lesions, ulcers on limited skin evaluation. No induration; Warm and dry.  Neurologic: CN 2-12 grossly intact with no focal deficits. Romberg sign cerebellar reflexes not assessed.  Psychiatric: Normal judgment and insight. Alert and oriented x 3. Normal mood and appropriate affect.   Data Reviewed: I have personally reviewed following labs and imaging studies  CBC:  Recent Labs Lab 02/18/17 1106 02/19/17 0452 02/20/17 0508 02/21/17 0513  WBC 14.6* 8.3 15.9* 13.1*    NEUTROABS 11.3*  --  13.1* 12.3*  HGB 12.6 10.6* 9.9* 10.2*  HCT 40.4 32.6* 30.6* 31.6*  MCV 93.1 92.1 90.8 92.4  PLT 267 200 220 371   Basic Metabolic Panel:  Recent Labs Lab 02/18/17 1106 02/18/17 1111 02/19/17 0452 02/20/17 0508 02/21/17 0513  NA 134*  --  136 136 135  K 5.0  --  4.4 4.4 4.9  CL 92*  --  96* 97* 96*  CO2 31  --  32 33* 33*  GLUCOSE 175*  --  136* 181* 312*  BUN 11  --  9 19 17   CREATININE 0.61  --  0.42* 0.54 0.46  CALCIUM 10.0  --  9.0 9.2 9.4  MG  --  1.6* 1.2* 1.9 1.7  PHOS  --   --   --  2.7 3.1   GFR: Estimated Creatinine Clearance: 64.3 mL/min (by C-G formula based on SCr of 0.46 mg/dL). Liver Function Tests:  Recent Labs Lab 02/18/17 1106 02/20/17 0508 02/21/17 0513  AST 28 17 15   ALT 19 14 16   ALKPHOS 80 50 51  BILITOT 0.8 0.3 0.3  PROT 8.6* 5.9* 6.1*  ALBUMIN 4.6 3.1* 3.3*    Recent Labs Lab 02/18/17 1106  LIPASE 32   No results for input(s): AMMONIA in the last 168 hours. Coagulation Profile:  Recent Labs Lab 02/18/17 1106  INR 1.43   Cardiac Enzymes: No results for input(s): CKTOTAL, CKMB, CKMBINDEX, TROPONINI in the last 168 hours. BNP (last 3 results) No results for input(s): PROBNP in the last 8760 hours. HbA1C:  Recent Labs  02/18/17 1734  HGBA1C 6.9*   CBG:  Recent Labs Lab 02/20/17 0736 02/20/17 1148 02/20/17 1624 02/20/17 1927 02/20/17 2117  GLUCAP 161* 319* 231* 280* 268*   Lipid Profile: No results for input(s): CHOL, HDL, LDLCALC, TRIG, CHOLHDL, LDLDIRECT in the last 72 hours. Thyroid Function Tests: No results for input(s): TSH, T4TOTAL, FREET4, T3FREE, THYROIDAB in the last 72 hours. Anemia Panel: No results for input(s): VITAMINB12, FOLATE, FERRITIN, TIBC, IRON, RETICCTPCT in the last 72 hours. Sepsis Labs:  Recent Labs Lab 02/18/17 1109 02/18/17 2050 02/19/17 0452 02/20/17 0508  PROCALCITON  --  <0.10 <0.10 <0.10  LATICACIDVEN 1.83  --   --   --     Recent Results (from the  past 240 hour(s))  MRSA PCR Screening     Status: None   Collection Time: 02/18/17  6:15 PM  Result Value Ref Range Status   MRSA by PCR NEGATIVE  NEGATIVE Final    Comment:        The GeneXpert MRSA Assay (FDA approved for NASAL specimens only), is one component of a comprehensive MRSA colonization surveillance program. It is not intended to diagnose MRSA infection nor to guide or monitor treatment for MRSA infections.   Respiratory Panel by PCR     Status: None   Collection Time: 02/19/17  4:00 PM  Result Value Ref Range Status   Adenovirus NOT DETECTED NOT DETECTED Final   Coronavirus 229E NOT DETECTED NOT DETECTED Final   Coronavirus HKU1 NOT DETECTED NOT DETECTED Final   Coronavirus NL63 NOT DETECTED NOT DETECTED Final   Coronavirus OC43 NOT DETECTED NOT DETECTED Final   Metapneumovirus NOT DETECTED NOT DETECTED Final   Rhinovirus / Enterovirus NOT DETECTED NOT DETECTED Final   Influenza A NOT DETECTED NOT DETECTED Final   Influenza B NOT DETECTED NOT DETECTED Final   Parainfluenza Virus 1 NOT DETECTED NOT DETECTED Final   Parainfluenza Virus 2 NOT DETECTED NOT DETECTED Final   Parainfluenza Virus 3 NOT DETECTED NOT DETECTED Final   Parainfluenza Virus 4 NOT DETECTED NOT DETECTED Final   Respiratory Syncytial Virus NOT DETECTED NOT DETECTED Final   Bordetella pertussis NOT DETECTED NOT DETECTED Final   Chlamydophila pneumoniae NOT DETECTED NOT DETECTED Final   Mycoplasma pneumoniae NOT DETECTED NOT DETECTED Final    Comment: Performed at [facility], [place]. [address], [place], [place] [address]    Radiology Studies: Dg Chest 1 View  Result Date: 02/20/2017 CLINICAL DATA:  COPD EXAM: CHEST 1 VIEW COMPARISON:  02/18/2017 CT chest FINDINGS: The lungs are hyperinflated likely secondary to COPD. There is mild right basilar atelectasis. There is no pleural effusion or pneumothorax. The heart and mediastinum are normal. 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 There is thoracic aortic  atherosclerosis. The osseous structures are unremarkable. IMPRESSION: No active disease. Electronically Signed   By: Kathreen Devoid   On: 02/20/2017 09:33   Scheduled Meds: . acetylcysteine  3 mL Nebulization Q6H  . ALPRAZolam  1 mg Oral QID  . aspirin EC  81 mg Oral Daily  . atorvastatin  40 mg Oral q1800  . dabigatran  150 mg Oral BID  . dextromethorphan-guaiFENesin  1 tablet Oral BID  . digoxin  125 mcg Oral Daily  . feeding supplement (GLUCERNA SHAKE)  237 mL Oral BID BM  . gabapentin  100 mg Oral TID  . insulin aspart  0-20 Units Subcutaneous TID WC  . insulin aspart  0-5 Units Subcutaneous QHS  . ipratropium  0.5 mg Nebulization Q6H  . levalbuterol  0.63 mg Nebulization Q6H  . lisinopril  2.5 mg Oral Daily  . magnesium oxide  200 mg Oral Daily  . mouth rinse  15 mL Mouth Rinse BID  . methylPREDNISolone (SOLU-MEDROL) injection  80 mg Intravenous Q12H  . metoprolol tartrate  100 mg Oral Daily   And  . metoprolol tartrate  75 mg Oral QHS  . mometasone-formoterol  2 puff Inhalation BID  . montelukast  10 mg Oral Daily   Continuous Infusions: . azithromycin Stopped (02/20/17 1630)    LOS: 3 days   Kerney Elbe, DO [facility] Pager [phone]  If 7PM-7AM, please contact night-coverage [email or web address] Password [email or web address] 2020-03-2917, 8:07 AM

## 2017-02-21 NOTE — Progress Notes (Signed)
Subjective: She says she feels better. She is still coughing mostly nonproductively. No other new complaints. No chest pain nausea or vomiting  Objective: Vital signs in last 24 hours: Temp:  [97.6 F (36.4 C)-98.7 F (37.1 C)] 98.3 F (36.8 C) (04/29 0810) Pulse Rate:  [75-104] 77 (04/29 0810) Resp:  [15-35] 19 (04/29 0810) BP: (109-140)/(59-85) 140/74 (04/29 0600) SpO2:  [89 %-96 %] 96 % (04/29 0921) Weight:  [59.7 kg (131 lb 9.8 oz)] 59.7 kg (131 lb 9.8 oz) (04/29 0500) Weight change: 2 kg (4 lb 6.5 oz) Last BM Date: 02/18/17 (per pt, per RN staff)  Intake/Output from previous day: 04/28 0701 - 04/29 0700 In: 27 [P.O.:720; IV Piggyback:500] Out: 500 [Urine:500]  PHYSICAL EXAM General appearance: alert, cooperative and no distress Resp: rhonchi bilaterally Cardio: She is in atrial fib and her heart rates about 90. no gallup GI: Abdomen is soft with no masses Extremities: extremities normal, atraumatic, no cyanosis or edema Skin warm and dry  Lab Results:  Results for orders placed or performed during the hospital encounter of 02/18/17 (from the past 48 hour(s))  Glucose, capillary     Status: Abnormal   Collection Time: 02/19/17 11:50 AM  Result Value Ref Range   Glucose-Capillary 195 (H) 65 - 99 mg/dL  Respiratory Panel by PCR     Status: None   Collection Time: 02/19/17  4:00 PM  Result Value Ref Range   Adenovirus NOT DETECTED NOT DETECTED   Coronavirus 229E NOT DETECTED NOT DETECTED   Coronavirus HKU1 NOT DETECTED NOT DETECTED   Coronavirus NL63 NOT DETECTED NOT DETECTED   Coronavirus OC43 NOT DETECTED NOT DETECTED   Metapneumovirus NOT DETECTED NOT DETECTED   Rhinovirus / Enterovirus NOT DETECTED NOT DETECTED   Influenza A NOT DETECTED NOT DETECTED   Influenza B NOT DETECTED NOT DETECTED   Parainfluenza Virus 1 NOT DETECTED NOT DETECTED   Parainfluenza Virus 2 NOT DETECTED NOT DETECTED   Parainfluenza Virus 3 NOT DETECTED NOT DETECTED   Parainfluenza Virus  4 NOT DETECTED NOT DETECTED   Respiratory Syncytial Virus NOT DETECTED NOT DETECTED   Bordetella pertussis NOT DETECTED NOT DETECTED   Chlamydophila pneumoniae NOT DETECTED NOT DETECTED   Mycoplasma pneumoniae NOT DETECTED NOT DETECTED    Comment: Performed at [facility], [address], [place], [place] [address]  Glucose, capillary     Status: Abnormal   Collection Time: 02/19/17  5:02 PM  Result Value Ref Range   Glucose-Capillary 208 (H) 65 - 99 mg/dL   Comment 1 Notify RN    Comment 2 Document in Chart   Glucose, capillary     Status: Abnormal   Collection Time: 02/20/17 12:06 AM  Result Value Ref Range   Glucose-Capillary 338 (H) 65 - 99 mg/dL   Comment 1 Notify RN    Comment 2 Document in Chart   Procalcitonin     Status: None   Collection Time: 02/20/17  5:08 AM  Result Value Ref Range   Procalcitonin <0.10 ng/mL    Comment:        Interpretation: PCT (Procalcitonin) <= 0.5 ng/mL: Systemic infection (sepsis) is not likely. Local bacterial infection is possible. (NOTE)         ICU PCT Algorithm               Non ICU PCT Algorithm    ----------------------------     ------------------------------         PCT < 0.25 ng/mL  PCT < 0.1 ng/mL     Stopping of antibiotics            Stopping of antibiotics       strongly encouraged.               strongly encouraged.    ----------------------------     ------------------------------       PCT level decrease by               PCT < 0.25 ng/mL       >= 80% from peak PCT       OR PCT 0.25 - 0.5 ng/mL          Stopping of antibiotics                                             encouraged.     Stopping of antibiotics           encouraged.    ----------------------------     ------------------------------       PCT level decrease by              PCT >= 0.25 ng/mL       < 80% from peak PCT        AND PCT >= 0.5 ng/mL            Continuin g antibiotics                                               encouraged.       Continuing antibiotics            encouraged.    ----------------------------     ------------------------------     PCT level increase compared          PCT > 0.5 ng/mL         with peak PCT AND          PCT >= 0.5 ng/mL             Escalation of antibiotics                                          strongly encouraged.      Escalation of antibiotics        strongly encouraged.   CBC with Differential/Platelet     Status: Abnormal   Collection Time: 02/20/17  5:08 AM  Result Value Ref Range   WBC 15.9 (H) 4.0 - 10.5 K/uL   RBC 3.37 (L) 3.87 - 5.11 MIL/uL   Hemoglobin 9.9 (L) 12.0 - 15.0 g/dL   HCT 30.6 (L) 36.0 - 46.0 %   MCV 90.8 78.0 - 100.0 fL   MCH 29.4 26.0 - 34.0 pg   MCHC 32.4 30.0 - 36.0 g/dL   RDW 14.9 11.5 - 15.5 %   Platelets 220 150 - 400 K/uL   Neutrophils Relative % 83 %   Neutro Abs 13.1 (H) 1.7 - 7.7 K/uL   Lymphocytes Relative 7 %   Lymphs Abs 1.2 0.7 - 4.0 K/uL   Monocytes Relative 10 %   Monocytes Absolute 1.6 (H) 0.1 -  1.0 K/uL   Eosinophils Relative 0 %   Eosinophils Absolute 0.0 0.0 - 0.7 K/uL   Basophils Relative 0 %   Basophils Absolute 0.0 0.0 - 0.1 K/uL  Comprehensive metabolic panel     Status: Abnormal   Collection Time: 02/20/17  5:08 AM  Result Value Ref Range   Sodium 136 135 - 145 mmol/L   Potassium 4.4 3.5 - 5.1 mmol/L   Chloride 97 (L) 101 - 111 mmol/L   CO2 33 (H) 22 - 32 mmol/L   Glucose, Bld 181 (H) 65 - 99 mg/dL   BUN 19 6 - 20 mg/dL   Creatinine, Ser 0.54 0.44 - 1.00 mg/dL   Calcium 9.2 8.9 - 10.3 mg/dL   Total Protein 5.9 (L) 6.5 - 8.1 g/dL   Albumin 3.1 (L) 3.5 - 5.0 g/dL   AST 17 15 - 41 U/L   ALT 14 14 - 54 U/L   Alkaline Phosphatase 50 38 - 126 U/L   Total Bilirubin 0.3 0.3 - 1.2 mg/dL   GFR calc non Af Amer >60 >60 mL/min   GFR calc Af Amer >60 >60 mL/min    Comment: (NOTE) The eGFR has been calculated using the CKD EPI equation. This calculation has not been validated in all clinical  situations. eGFR's persistently <60 mL/min signify possible Chronic Kidney Disease.    Anion gap 6 5 - 15  Magnesium     Status: None   Collection Time: 02/20/17  5:08 AM  Result Value Ref Range   Magnesium 1.9 1.7 - 2.4 mg/dL  Phosphorus     Status: None   Collection Time: 02/20/17  5:08 AM  Result Value Ref Range   Phosphorus 2.7 2.5 - 4.6 mg/dL  Glucose, capillary     Status: Abnormal   Collection Time: 02/20/17  5:11 AM  Result Value Ref Range   Glucose-Capillary 169 (H) 65 - 99 mg/dL  Blood gas, arterial     Status: Abnormal   Collection Time: 02/20/17  5:30 AM  Result Value Ref Range   FIO2 28.00    O2 Content 2.0 L/min   Delivery systems NASAL CANNULA    pH, Arterial 7.398 7.350 - 7.450   pCO2 arterial 56.4 (H) 32.0 - 48.0 mmHg   pO2, Arterial 85.6 83.0 - 108.0 mmHg   Bicarbonate 32.2 (H) 20.0 - 28.0 mmol/L   Acid-Base Excess 9.1 (H) 0.0 - 2.0 mmol/L   O2 Saturation 96.1 %   Collection site RIGHT RADIAL    Drawn by [id number]    Sample type ARTERIAL    Allens test (pass/fail) PASS PASS  Glucose, capillary     Status: Abnormal   Collection Time: 02/20/17  7:36 AM  Result Value Ref Range   Glucose-Capillary 161 (H) 65 - 99 mg/dL  Glucose, capillary     Status: Abnormal   Collection Time: 02/20/17 11:48 AM  Result Value Ref Range   Glucose-Capillary 319 (H) 65 - 99 mg/dL  Glucose, capillary     Status: Abnormal   Collection Time: 02/20/17  4:24 PM  Result Value Ref Range   Glucose-Capillary 231 (H) 65 - 99 mg/dL  Glucose, capillary     Status: Abnormal   Collection Time: 02/20/17  7:27 PM  Result Value Ref Range   Glucose-Capillary 280 (H) 65 - 99 mg/dL   Comment 1 Notify RN   Glucose, capillary     Status: Abnormal   Collection Time: 02/20/17  9:17 PM  Result Value Ref Range  Glucose-Capillary 268 (H) 65 - 99 mg/dL   Comment 1 Notify RN   CBC with Differential/Platelet     Status: Abnormal   Collection Time: 02/21/17  5:13 AM  Result Value Ref Range   WBC  13.1 (H) 4.0 - 10.5 K/uL   RBC 3.42 (L) 3.87 - 5.11 MIL/uL   Hemoglobin 10.2 (L) 12.0 - 15.0 g/dL   HCT 31.6 (L) 36.0 - 46.0 %   MCV 92.4 78.0 - 100.0 fL   MCH 29.8 26.0 - 34.0 pg   MCHC 32.3 30.0 - 36.0 g/dL   RDW 15.3 11.5 - 15.5 %   Platelets 220 150 - 400 K/uL   Neutrophils Relative % 93 %   Neutro Abs 12.3 (H) 1.7 - 7.7 K/uL   Lymphocytes Relative 5 %   Lymphs Abs 0.6 (L) 0.7 - 4.0 K/uL   Monocytes Relative 2 %   Monocytes Absolute 0.2 0.1 - 1.0 K/uL   Eosinophils Relative 0 %   Eosinophils Absolute 0.0 0.0 - 0.7 K/uL   Basophils Relative 0 %   Basophils Absolute 0.0 0.0 - 0.1 K/uL  Comprehensive metabolic panel     Status: Abnormal   Collection Time: 02/21/17  5:13 AM  Result Value Ref Range   Sodium 135 135 - 145 mmol/L   Potassium 4.9 3.5 - 5.1 mmol/L   Chloride 96 (L) 101 - 111 mmol/L   CO2 33 (H) 22 - 32 mmol/L   Glucose, Bld 312 (H) 65 - 99 mg/dL   BUN 17 6 - 20 mg/dL   Creatinine, Ser 0.46 0.44 - 1.00 mg/dL   Calcium 9.4 8.9 - 10.3 mg/dL   Total Protein 6.1 (L) 6.5 - 8.1 g/dL   Albumin 3.3 (L) 3.5 - 5.0 g/dL   AST 15 15 - 41 U/L   ALT 16 14 - 54 U/L   Alkaline Phosphatase 51 38 - 126 U/L   Total Bilirubin 0.3 0.3 - 1.2 mg/dL   GFR calc non Af Amer >60 >60 mL/min   GFR calc Af Amer >60 >60 mL/min    Comment: (NOTE) The eGFR has been calculated using the CKD EPI equation. This calculation has not been validated in all clinical situations. eGFR's persistently <60 mL/min signify possible Chronic Kidney Disease.    Anion gap 6 5 - 15  Magnesium     Status: None   Collection Time: 02/21/17  5:13 AM  Result Value Ref Range   Magnesium 1.7 1.7 - 2.4 mg/dL  Phosphorus     Status: None   Collection Time: 02/21/17  5:13 AM  Result Value Ref Range   Phosphorus 3.1 2.5 - 4.6 mg/dL  Glucose, capillary     Status: Abnormal   Collection Time: 02/21/17  8:09 AM  Result Value Ref Range   Glucose-Capillary 311 (H) 65 - 99 mg/dL    ABGS  Recent Labs   02/20/17 0530  PHART 7.398  PO2ART 85.6  HCO3 32.2*   CULTURES Recent Results (from the past 240 hour(s))  MRSA PCR Screening     Status: None   Collection Time: 02/18/17  6:15 PM  Result Value Ref Range Status   MRSA by PCR NEGATIVE NEGATIVE Final    Comment:        The GeneXpert MRSA Assay (FDA approved for NASAL specimens only), is one component of a comprehensive MRSA colonization surveillance program. It is not intended to diagnose MRSA infection nor to guide or monitor treatment for MRSA infections.  Respiratory Panel by PCR     Status: None   Collection Time: 02/19/17  4:00 PM  Result Value Ref Range Status   Adenovirus NOT DETECTED NOT DETECTED Final   Coronavirus 229E NOT DETECTED NOT DETECTED Final   Coronavirus HKU1 NOT DETECTED NOT DETECTED Final   Coronavirus NL63 NOT DETECTED NOT DETECTED Final   Coronavirus OC43 NOT DETECTED NOT DETECTED Final   Metapneumovirus NOT DETECTED NOT DETECTED Final   Rhinovirus / Enterovirus NOT DETECTED NOT DETECTED Final   Influenza A NOT DETECTED NOT DETECTED Final   Influenza B NOT DETECTED NOT DETECTED Final   Parainfluenza Virus 1 NOT DETECTED NOT DETECTED Final   Parainfluenza Virus 2 NOT DETECTED NOT DETECTED Final   Parainfluenza Virus 3 NOT DETECTED NOT DETECTED Final   Parainfluenza Virus 4 NOT DETECTED NOT DETECTED Final   Respiratory Syncytial Virus NOT DETECTED NOT DETECTED Final   Bordetella pertussis NOT DETECTED NOT DETECTED Final   Chlamydophila pneumoniae NOT DETECTED NOT DETECTED Final   Mycoplasma pneumoniae NOT DETECTED NOT DETECTED Final    Comment: Performed at [facility], [place]. [address] [place], [place] [address]   Studies/Results: Dg Chest 1 View  Result Date: 02/20/2017 CLINICAL DATA:  COPD EXAM: CHEST 1 VIEW COMPARISON:  02/18/2017 CT chest FINDINGS: The lungs are hyperinflated likely secondary to COPD. There is mild right basilar atelectasis. There is no pleural effusion or pneumothorax.  The heart and mediastinum are normal. 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 There is thoracic aortic atherosclerosis. The osseous structures are unremarkable. IMPRESSION: No active disease. Electronically Signed   By: Kathreen Devoid   On: 02/20/2017 09:33    Medications:  Prior to Admission:  Prescriptions Prior to Admission  Medication Sig Dispense Refill Last Dose  . ACCU-CHEK AVIVA PLUS test strip USE TO TEST 3 TO 4 TIMES DAILY OR AS DIRECTED. 100 each PRN Taking  . acetaminophen (TYLENOL) 500 MG tablet Take 500 mg by mouth every 6 (six) hours as needed for mild pain.   Taking  . albuterol (PROAIR HFA) 108 (90 BASE) MCG/ACT inhaler Inhale 2 puffs into the lungs every 6 (six) hours as needed for wheezing or shortness of breath. 18 g 11 02/18/2017 at Unknown time  . ALPRAZolam (XANAX) 1 MG tablet Take 1 mg by mouth 4 (four) times daily.    02/18/2017 at Unknown time  . aspirin EC 81 MG tablet Take 81 mg by mouth daily.   02/18/2017 at Unknown time  . atorvastatin (LIPITOR) 40 MG tablet Take 1 tablet (40 mg total) by mouth daily. KEEP OV. 90 tablet 1 02/18/2017 at Unknown time  . DIGOX 125 MCG tablet TAKE ONE TABLET BY MOUTH ONCE DAILY. 30 tablet 10 02/18/2017 at Unknown time  . fluticasone (FLONASE) 50 MCG/ACT nasal spray Place 2 sprays into both nostrils daily. (Patient taking differently: Place 2 sprays into both nostrils daily as needed for allergies. ) 16 g 6 Taking  . furosemide (LASIX) 40 MG tablet TAKE ONE TABLET BY MOUTH ONCE DAILY. 30 tablet 6 02/18/2017 at Unknown time  . gabapentin (NEURONTIN) 100 MG capsule Take 1 capsule by mouth 3 (three) times daily.   02/18/2017 at Unknown time  . glipiZIDE (GLUCOTROL XL) 2.5 MG 24 hr tablet 2.5 mg daily.    02/18/2017 at Unknown time  . JANUVIA 100 MG tablet TAKE ONE TABLET BY MOUTH DAILY. 30 tablet 0 02/18/2017 at Unknown time  . lisinopril (PRINIVIL,ZESTRIL) 2.5 MG tablet Take 1 tablet (2.5 mg total)  by mouth daily. 30 tablet 11 02/18/2017 at Unknown time  .  Magnesium Oxide 250 MG TABS Take 250 mg by mouth daily.    02/18/2017 at Unknown time  . metFORMIN (GLUCOPHAGE) 850 MG tablet TAKE 1 TABLET BY MOUTH TWICE DAILY WITH MEALS. 60 tablet 0 02/18/2017 at Unknown time  . metoprolol (LOPRESSOR) 50 MG tablet Take 2 tablets in the morning and 1 1/2 tablets at night 105 tablet 11 02/18/2017 at 700  . montelukast (SINGULAIR) 10 MG tablet Take 1 tablet by mouth daily.   02/18/2017 at Unknown time  . Multiple Vitamin (MULTIVITAMIN) capsule Take 1 capsule by mouth daily.   02/18/2017 at Unknown time  . potassium chloride SA (K-DUR,KLOR-CON) 20 MEQ tablet Take 1 tablet (20 mEq total) by mouth daily. 30 tablet 0 02/18/2017 at Unknown time  . PRADAXA 150 MG CAPS capsule TAKE ONE CAPSULE BY MOUTH TWICE DAILY. 60 capsule 10 02/18/2017 at 700  . sodium chloride (OCEAN) 0.65 % nasal spray Place 1 spray into the nose as needed for congestion. Congestion   Taking  . SYMBICORT 160-4.5 MCG/ACT inhaler INHALE 2 PUFFS INTO THE LUNGS TWICE DAILY. RINSE MOUTH AFTER USE. 10.2 g 0 02/18/2017 at Unknown time   Scheduled: . acetylcysteine  3 mL Nebulization Q6H  . ALPRAZolam  1 mg Oral QID  . aspirin EC  81 mg Oral Daily  . atorvastatin  40 mg Oral q1800  . dabigatran  150 mg Oral BID  . dextromethorphan-guaiFENesin  1 tablet Oral BID  . digoxin  125 mcg Oral Daily  . feeding supplement (GLUCERNA SHAKE)  237 mL Oral BID BM  . gabapentin  100 mg Oral TID  . insulin aspart  0-20 Units Subcutaneous TID WC  . insulin aspart  0-5 Units Subcutaneous QHS  . insulin glargine  10 Units Subcutaneous Daily  . ipratropium  0.5 mg Nebulization Q6H  . levalbuterol  0.63 mg Nebulization Q6H  . lisinopril  2.5 mg Oral Daily  . magnesium oxide  200 mg Oral Daily  . mouth rinse  15 mL Mouth Rinse BID  . methylPREDNISolone (SOLU-MEDROL) injection  60 mg Intravenous Q6H  . metoprolol tartrate  100 mg Oral Daily   And  . metoprolol tartrate  75 mg Oral QHS  . mometasone-formoterol  2 puff  Inhalation BID  . montelukast  10 mg Oral Daily  . sodium chloride HYPERTONIC  4 mL Nebulization Daily   Continuous: . azithromycin Stopped (02/20/17 1630)   [id number] **OR** acetaminophen, albuterol, fluticasone, ondansetron **OR** ondansetron (ZOFRAN) IV, sodium chloride  Assesment: She was admitted with acute on chronic hypoxic respiratory failure. She has COPD exacerbation. She has chronic diastolic heart failure but that seems pretty well controlled. On admission she had atrial fib with rapid ventricular response. She required BiPAP on admission but is much improved now. Principal Problem:   Acute on chronic respiratory failure with hypoxia (HCC) Active Problems:   Diabetes mellitus (HCC)   Diastolic congestive heart failure (HCC)   Atrial fibrillation with rapid ventricular response (HCC)   COPD with acute exacerbation (HCC)   Hypomagnesemia   Malnutrition of moderate degree    Plan: Continue current treatments    LOS: 3 days   Adjoa Althouse L 2020-01-2517, 10:19 AM

## 2017-02-22 LAB — CBC WITH DIFFERENTIAL/PLATELET
Basophils Absolute: 0 10*3/uL (ref 0.0–0.1)
Basophils Relative: 0 %
EOS ABS: 0 10*3/uL (ref 0.0–0.7)
EOS PCT: 0 %
HCT: 32.2 % — ABNORMAL LOW (ref 36.0–46.0)
Hemoglobin: 10.3 g/dL — ABNORMAL LOW (ref 12.0–15.0)
LYMPHS ABS: 0.9 10*3/uL (ref 0.7–4.0)
Lymphocytes Relative: 6 %
MCH: 29.7 pg (ref 26.0–34.0)
MCHC: 32 g/dL (ref 30.0–36.0)
MCV: 92.8 fL (ref 78.0–100.0)
Monocytes Absolute: 0.2 10*3/uL (ref 0.1–1.0)
Monocytes Relative: 1 %
Neutro Abs: 14 10*3/uL — ABNORMAL HIGH (ref 1.7–7.7)
Neutrophils Relative %: 93 %
Platelets: 237 10*3/uL (ref 150–400)
RBC: 3.47 MIL/uL — AB (ref 3.87–5.11)
RDW: 15.3 % (ref 11.5–15.5)
WBC: 15 10*3/uL — AB (ref 4.0–10.5)

## 2017-02-22 LAB — COMPREHENSIVE METABOLIC PANEL
ALT: 13 U/L — AB (ref 14–54)
ANION GAP: 8 (ref 5–15)
AST: 13 U/L — ABNORMAL LOW (ref 15–41)
Albumin: 3.3 g/dL — ABNORMAL LOW (ref 3.5–5.0)
Alkaline Phosphatase: 50 U/L (ref 38–126)
BUN: 23 mg/dL — ABNORMAL HIGH (ref 6–20)
CHLORIDE: 93 mmol/L — AB (ref 101–111)
CO2: 33 mmol/L — AB (ref 22–32)
Calcium: 9.3 mg/dL (ref 8.9–10.3)
Creatinine, Ser: 0.53 mg/dL (ref 0.44–1.00)
GFR calc non Af Amer: >60 mL/min (ref >60)
Glucose, Bld: 339 mg/dL — ABNORMAL HIGH (ref 65–99)
POTASSIUM: 4.5 mmol/L (ref 3.5–5.1)
SODIUM: 134 mmol/L — AB (ref 135–145)
Total Bilirubin: 0.4 mg/dL (ref 0.3–1.2)
Total Protein: 6.1 g/dL — ABNORMAL LOW (ref 6.5–8.1)

## 2017-02-22 LAB — GLUCOSE, CAPILLARY
GLUCOSE-CAPILLARY: 416 mg/dL — AB (ref 65–99)
GLUCOSE-CAPILLARY: 128 mg/dL — AB (ref 65–99)
Glucose-Capillary: 464 mg/dL — ABNORMAL HIGH (ref 65–99)
Glucose-Capillary: 268 mg/dL — ABNORMAL HIGH (ref 65–99)
Glucose-Capillary: 221 mg/dL — ABNORMAL HIGH (ref 65–99)

## 2017-02-22 LAB — PHOSPHORUS: PHOSPHORUS: 4 mg/dL (ref 2.5–4.6)

## 2017-02-22 LAB — MAGNESIUM: Magnesium: 1.6 mg/dL — ABNORMAL LOW (ref 1.7–2.4)

## 2017-02-22 MED ORDER — INSULIN ASPART 100 UNIT/ML ~~LOC~~ SOLN
5.0000 [IU] | Freq: Three times a day (TID) | SUBCUTANEOUS | Status: DC
  Administered 2017-02-22 (×3): 5 [IU] via SUBCUTANEOUS

## 2017-02-22 MED ORDER — INSULIN ASPART 100 UNIT/ML ~~LOC~~ SOLN: 8.0000 [IU] | Freq: Once | SUBCUTANEOUS | Status: DC

## 2017-02-22 MED ORDER — GUAIFENESIN ER 600 MG PO TB12
1200.0000 mg | ORAL_TABLET | Freq: Two times a day (BID) | ORAL | Status: DC
  Administered 2017-02-22 – 2017-02-24 (×5): 1200 mg via ORAL
  Filled 2017-02-22 (×5): qty 2

## 2017-02-22 MED ORDER — METHYLPREDNISOLONE SODIUM SUCC 125 MG IJ SOLR
60.0000 mg | Freq: Two times a day (BID) | INTRAMUSCULAR | Status: DC
  Administered 2017-02-22 – 2017-02-24 (×4): 60 mg via INTRAVENOUS
  Filled 2017-02-22 (×4): qty 2

## 2017-02-22 MED ORDER — INSULIN GLARGINE 100 UNIT/ML ~~LOC~~ SOLN
15.0000 [IU] | Freq: Every day | SUBCUTANEOUS | Status: DC
  Administered 2017-02-22: 15 [IU] via SUBCUTANEOUS
  Filled 2017-02-22 (×2): qty 0.15

## 2017-02-22 MED ORDER — MENTHOL 3 MG MT LOZG: 1.0000 | LOZENGE | OROMUCOSAL | Status: DC | PRN

## 2017-02-22 MED ORDER — MAGNESIUM SULFATE 2 GM/50ML IV SOLN
2.0000 g | Freq: Once | INTRAVENOUS | Status: AC
  Administered 2017-02-22: 2 g via INTRAVENOUS
  Filled 2017-02-22: qty 50

## 2017-02-22 NOTE — Progress Notes (Signed)
PROGRESS NOTE    Mary [name]  MLY:[id number] DOB: [birth year] DOA: 02/18/2017 PCP: Wende Neighbors, MD   Brief Narrative:  Mary [name] is a 66 y.o. female with medical history significant of COPD not apparently on home O2; chronic systolic HF (EF 81-27% in 2015), afib on Pradaxa; DM; and anxiety presenting to the hospital with worsening SOB.  Symptoms started last Friday.  She was hospitalized for SOB from 1/10-12 for apparent COPD exacerbation and/or mild CHF exacerbation (did not require BIPAP).  She was treated with IV Lasix, steroids, and azithromycin.She was unable to provide further history at the time of admission due to somnolence on BIPAP. Per report she had SOB associated with a cough for the past 6 days and had been taking tylenol and her inhaler without relief. Had reported she had a fever and CP but on my evaluation today she states it is from when she has been coughing so much. Still smokes occasional Cigarettes. Was admitted to SDU for COPD Exacerbation and placed on BiPAP and is now weaned off BiPAP and improving. Had Some RVR but Metoprolol was held because of Hypotension. RVR is improved. Patient was transferred to the medical floor on 02/21/17 and she is doing well however states she is unable to still expectorate like she wants. Hospitalization is complicated by uncontrolled hyperglycemia in the setting of IV Steroid Use.  Assessment & Plan:   Principal Problem:   Acute on chronic respiratory failure with hypoxia (HCC) Active Problems:   Diabetes mellitus (HCC)   Diastolic congestive heart failure (HCC)   Atrial fibrillation with rapid ventricular response (HCC)   COPD with acute exacerbation (HCC)   Hypomagnesemia   Malnutrition of moderate degree  Acute Hypercapnic Hypoxic Respiratory Failure requiring NIPPV with BiPAP likely 2/2 to Acute Exacerbation of COPD, improving and now on O2 via [place] -O2 Saturations in ED were in the 60'S on Admission -Weaned BiPAP and transitioned to O2  via [place]; Now Saturating well on 2 liters of [place] -Does not wear Home O2 -Continuous Pulse Oximetry and Maintain O2 Saturations > 92% -Checked Respiratory Virus Panel Negative; Removed Droplet Precautions -Repeat CXR 02/20/17 showed The lungs are hyperinflated likely secondary to COPD. There is mild right basilar atelectasis. There is no pleural effusion or pneumothorax. The heart and mediastinum are normal. 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 There is thoracic aortic atherosclerosis -Will need to continue to Wean O2 and have Walk Screen Prior to D/C -PT recommends Home Health PT  Acute Exacerbation of COPD -As above -Checked Respiratory Virus Panel and was Negative -Changed IV Methylprednisolone from 60 mg q6h to 60 mg IV q12h -C/w IV Azithromycin  500 mg q24h -DuoNebs changed to Xopenex/Atrovent q6h because of RVR and Added Symibort Substitution of Dulera  -C/w Montelukast 10 mg po Daily -C/w Mucinex DM 1 tab po BID  -Dr. Luan Pulling of Pulmonary  Consulted and following and he added Mucomyst Nebs 3 mL q6h yesterday -Added Hypertonic Saline Nebs 4 mL Daily x 3 days -Will Try Chest Physiotherapy   Afib with RVR -Patient with known and Chronic Atrial Fib- Was in RVR this AM but improved with morning meds so Cardizem gtt was held -C/w RemoteTelemetry -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 -Was intially started on Cardizem gtt and stopped; Continue to Monitor for need of it -C/w Digoxin 125 mcg po Daily -C/w 100 mg of Metoprolol Daily and 75 mg of Metoprolol qhs  DM Type 2 with Uncontrolled Hyperglycemia in  the setting of IV Steroid Use -Hold oral hypoglycemics -Repeat HbA1c was 6.9, the last was 8.6 in 4/16 -C/w Resistent Novolog SSI AC HS -Increased Lantus 10 units Daily to 15 units Daily -Added 5 mg of sq Novolog TIDwm -Consulted Diabetic Education Coordinator and appreciate Recc's  -CBG's ranging from 221-268  Hypomagnesemia -Magnesium was 1.6 on Admission;  Repeat Mag Level this AM was 1.6 -Replete with IV Mag Sulfate 2 grams -C/w Mag Oxide 200 mg po Daily -Repeat Mag Level in AM  Hx of Combined Systolic Heart Failure with EF 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% and Diastolic Grade 2 Dysfunction; Now EF of 55-60% -ECHO 2015 shows EF of 30-35% and same year ECHO showes Grade 2 Diastolic Dysfunction -Currently Euvolemic and Compensated -C/w Metoprolol 100 mg Daily and Metoprolol 75 mg po qHs -C/w Lisinopril 2.5 mg po Daily  Hypertension -Currently well controlled -C/w 100 mg of Metoprolol Daily and 75 mg of Metoprolol qhs -C/w Lisinopril  2.5 mg po Daily  Hyperlipidemia -C/w Atorvastatin 40 mg po qHS  Anxiety  -C/w Alprazolam 1 mg po 4 times daily  Leukocytosis -WBC went from 14.6 -> 8.3 -> 15.9 -> 13.1 -> 15.0 -Likely induced by Steroids -IVF now D/C'd  Moderate Protein Calorie Malnutrition -Dietician/Nutritionist consult appreciated -C/w Glucerna 237 mL po BID between meals  Nodular Lung Opacities -Patient admitted to having unintentional  -CT Scan showed There are areas of adenopathy, most pronounced in the precarinal and sub- carinal regions. There are nodular opacities in the lungs, largest measuring 1.3 x 1.1 x 0.9 cm in the superior segment right lower lobe. This combination of findings is concerning for neoplasm -Will need Outpatient PET-CT to further evaluate when out of the hospital   DVT prophylaxis: Anticoagulated with Dabigatran 150 mg po BID Code Status: FULL CODE Family Communication: No family present at bedside Disposition Plan: [facility] when medically stable; Transfer to Medical Floor as patient has improved.  Consultants:   None   Procedures: None  Antimicrobials:  Anti-infectives    Start     Dose/Rate Route Frequency Ordered Stop   02/18/17 1530  azithromycin (ZITHROMAX) 500 mg in dextrose 5 % 250 mL IVPB     500 mg 250 mL/hr over 60 Minutes Intravenous Every 24 hours 02/18/17 1526       Subjective: Seen and  examined and states she was feeling slightly better her main concern was not able to cough up her sputum. States she feels weak but is improved from when she came in. No Nausea or vomiting. No other concerns or complaints.    Objective: Vitals:   02/21/17 1512 02/21/17 2009 02/21/17 2110 02/22/17 0455  BP: 133/70  (!) 140/97 (!) 146/73  Pulse: (!) 109 (!) 102 (!) 112 83  Resp: 20 16 17 19   Temp: 98.4 F (36.9 C)  99 F (37.2 C) 98.4 F (36.9 C)  TempSrc: Oral  Oral Oral  SpO2: 100% 96% 92% 91%  Weight: 55.8 kg (123 lb)     Height: 5\' 7"  (1.702 m)       Intake/Output Summary (Last 24 hours) at 02/22/17 0756 Last data filed at 02/21/17 1800  Gross per 24 hour  Intake              120 ml  Output                0 ml  Net              120 ml   Filed  Weights   02/20/17 0400 02/21/17 0500 02/21/17 1512  Weight: 57.7 kg (127 lb 3.3 oz) 59.7 kg (131 lb 9.8 oz) 55.8 kg (123 lb)   Examination: Physical Exam:  Constitutional: NAD and appears calm receiving a breathing treatment Eyes: Lids and conjunctivae normal, sclerae anicteric  ENMT: External Ears, Nose appear normal. Grossly normal hearing. Mucous membranes are moist.  Neck: Appears normal, supple, no cervical masses, normal ROM, no appreciable thyromegaly Respiratory: Diminished to auscultation bilaterally with some wheezing and rhonchi. No appreciable crackles or rales. Increased respiratory effort but no accessory muscle use. Patient getting a breathing treatment Cardiovascular: Irregularly Irregular, Slight 2/6 Systolic murmur. No rubs / gallops. S1 and S2 auscultated. No extremity edema.  Chest Wall: Midline CABG incision noted Abdomen: Soft, non-tender, non-distended. No masses palpated. No appreciable hepatosplenomegaly. Bowel sounds positive x4.  GU: Deferred. Musculoskeletal: No clubbing / cyanosis of digits/nails. No joint deformity upper and lower extremities.  Skin: No rashes, lesions, ulcers on limited skin evaluation.  No induration; Warm and dry.  Neurologic: CN 2-12 grossly intact with no focal deficits. Romberg sign cerebellar reflexes not assessed.  Psychiatric: Normal judgment and insight. Alert and oriented x 3. Normal mood and appropriate affect.   Data Reviewed: I have personally reviewed following labs and imaging studies  CBC:  Recent Labs Lab 02/18/17 1106 02/19/17 0452 02/20/17 0508 02/21/17 0513 02/22/17 0456  WBC 14.6* 8.3 15.9* 13.1* 15.0*  NEUTROABS 11.3*  --  13.1* 12.3* 14.0*  HGB 12.6 10.6* 9.9* 10.2* 10.3*  HCT 40.4 32.6* 30.6* 31.6* 32.2*  MCV 93.1 92.1 90.8 92.4 92.8  PLT 267 200 220 220 782   Basic Metabolic Panel:  Recent Labs Lab 02/18/17 1106 02/18/17 1111 02/19/17 0452 02/20/17 0508 02/21/17 0513 02/22/17 0456  NA 134*  --  136 136 135 134*  K 5.0  --  4.4 4.4 4.9 4.5  CL 92*  --  96* 97* 96* 93*  CO2 31  --  32 33* 33* 33*  GLUCOSE 175*  --  136* 181* 312* 339*  BUN 11  --  9 19 17  23*  CREATININE 0.61  --  0.42* 0.54 0.46 0.53  CALCIUM 10.0  --  9.0 9.2 9.4 9.3  MG  --  1.6* 1.2* 1.9 1.7 1.6*  PHOS  --   --   --  2.7 3.1 4.0   GFR: Estimated Creatinine Clearance: 60.1 mL/min (by C-G formula based on SCr of 0.53 mg/dL). Liver Function Tests:  Recent Labs Lab 02/18/17 1106 02/20/17 0508 02/21/17 0513 02/22/17 0456  AST 28 17 15  13*  ALT 19 14 16  13*  ALKPHOS 80 50 51 50  BILITOT 0.8 0.3 0.3 0.4  PROT 8.6* 5.9* 6.1* 6.1*  ALBUMIN 4.6 3.1* 3.3* 3.3*    Recent Labs Lab 02/18/17 1106  LIPASE 32   No results for input(s): AMMONIA in the last 168 hours. Coagulation Profile:  Recent Labs Lab 02/18/17 1106  INR 1.43   Cardiac Enzymes: No results for input(s): CKTOTAL, CKMB, CKMBINDEX, TROPONINI in the last 168 hours. BNP (last 3 results) No results for input(s): PROBNP in the last 8760 hours. HbA1C: No results for input(s): HGBA1C in the last 72 hours. CBG:  Recent Labs Lab 02/21/17 1040 02/21/17 1140 02/21/17 1341  02/21/17 1626 02/21/17 2120  GLUCAP 403* 396* 334* 111* 253*   Lipid Profile: No results for input(s): CHOL, HDL, LDLCALC, TRIG, CHOLHDL, LDLDIRECT in the last 72 hours. Thyroid Function Tests: No results for input(s): TSH,  T4TOTAL, FREET4, T3FREE, THYROIDAB in the last 72 hours. Anemia Panel: No results for input(s): VITAMINB12, FOLATE, FERRITIN, TIBC, IRON, RETICCTPCT in the last 72 hours. Sepsis Labs:  Recent Labs Lab 02/18/17 1109 02/18/17 2050 02/19/17 0452 02/20/17 0508  PROCALCITON  --  <0.10 <0.10 <0.10  LATICACIDVEN 1.83  --   --   --     Recent Results (from the past 240 hour(s))  MRSA PCR Screening     Status: None   Collection Time: 02/18/17  6:15 PM  Result Value Ref Range Status   MRSA by PCR NEGATIVE NEGATIVE Final    Comment:        The GeneXpert MRSA Assay (FDA approved for NASAL specimens only), is one component of a comprehensive MRSA colonization surveillance program. It is not intended to diagnose MRSA infection nor to guide or monitor treatment for MRSA infections.   Respiratory Panel by PCR     Status: None   Collection Time: 02/19/17  4:00 PM  Result Value Ref Range Status   Adenovirus NOT DETECTED NOT DETECTED Final   Coronavirus 229E NOT DETECTED NOT DETECTED Final   Coronavirus HKU1 NOT DETECTED NOT DETECTED Final   Coronavirus NL63 NOT DETECTED NOT DETECTED Final   Coronavirus OC43 NOT DETECTED NOT DETECTED Final   Metapneumovirus NOT DETECTED NOT DETECTED Final   Rhinovirus / Enterovirus NOT DETECTED NOT DETECTED Final   Influenza A NOT DETECTED NOT DETECTED Final   Influenza B NOT DETECTED NOT DETECTED Final   Parainfluenza Virus 1 NOT DETECTED NOT DETECTED Final   Parainfluenza Virus 2 NOT DETECTED NOT DETECTED Final   Parainfluenza Virus 3 NOT DETECTED NOT DETECTED Final   Parainfluenza Virus 4 NOT DETECTED NOT DETECTED Final   Respiratory Syncytial Virus NOT DETECTED NOT DETECTED Final   Bordetella pertussis NOT DETECTED NOT  DETECTED Final   Chlamydophila pneumoniae NOT DETECTED NOT DETECTED Final   Mycoplasma pneumoniae NOT DETECTED NOT DETECTED Final    Comment: Performed at [facility], [place]. [address], [place], [place] [address]    Radiology Studies: No results found. Scheduled Meds: . acetylcysteine  3 mL Nebulization Q6H  . ALPRAZolam  1 mg Oral QID  . aspirin EC  81 mg Oral Daily  . atorvastatin  40 mg Oral q1800  . dabigatran  150 mg Oral BID  . dextromethorphan-guaiFENesin  1 tablet Oral BID  . digoxin  125 mcg Oral Daily  . feeding supplement (GLUCERNA SHAKE)  237 mL Oral BID BM  . gabapentin  100 mg Oral TID  . insulin aspart  0-20 Units Subcutaneous TID WC  . insulin aspart  0-5 Units Subcutaneous QHS  . insulin aspart  5 Units Subcutaneous TID WC  . insulin glargine  15 Units Subcutaneous Daily  . ipratropium  0.5 mg Nebulization Q6H  . levalbuterol  0.63 mg Nebulization Q6H  . lisinopril  2.5 mg Oral Daily  . magnesium oxide  200 mg Oral Daily  . mouth rinse  15 mL Mouth Rinse BID  . methylPREDNISolone (SOLU-MEDROL) injection  60 mg Intravenous Q6H  . metoprolol tartrate  100 mg Oral Daily   And  . metoprolol tartrate  75 mg Oral QHS  . mometasone-formoterol  2 puff Inhalation BID  . montelukast  10 mg Oral Daily  . sodium chloride HYPERTONIC  4 mL Nebulization Daily   Continuous Infusions: . azithromycin 500 mg (02/21/17 1716)    LOS: 4 days   Kerney Elbe, DO [facility] Pager  [phone]  If 7PM-7AM, please contact night-coverage [email or web address] Password [facility] 02/22/2017, 7:56 AM

## 2017-02-22 NOTE — Progress Notes (Signed)
Subjective: She says she feels better. She has no new complaints. Her breathing is okay. No chest pain. No nausea or vomiting  Objective: Vital signs in last 24 hours: Temp:  [98.1 F (36.7 C)-99 F (37.2 C)] 98.4 F (36.9 C) (04/30 0455) Pulse Rate:  [83-112] 83 (04/30 0455) Resp:  [16-20] 19 (04/30 0455) BP: (127-146)/(70-97) 146/73 (04/30 0455) SpO2:  [88 %-100 %] 91 % (04/30 0455) Weight:  [55.8 kg (123 lb)] 55.8 kg (123 lb) (04/29 1512) Weight change: -3.907 kg (-8 lb 9.8 oz) Last BM Date: 02/18/17 (per pt, per RN staff)  Intake/Output from previous day: 04/29 0701 - 04/30 0700 In: 120 [P.O.:120] Out: 400 [Urine:400]  PHYSICAL EXAM General appearance: alert, cooperative and no distress Resp: Her chest is clear with prolonged expiratory phase Cardio: irregularly irregular rhythm GI: soft, non-tender; bowel sounds normal; no masses,  no organomegaly Extremities: extremities normal, atraumatic, no cyanosis or edema Skin warm and dry.  Lab Results:  Results for orders placed or performed during the hospital encounter of 02/18/17 (from the past 48 hour(s))  Glucose, capillary     Status: Abnormal   Collection Time: 02/20/17 11:48 AM  Result Value Ref Range   Glucose-Capillary 319 (H) 65 - 99 mg/dL  Glucose, capillary     Status: Abnormal   Collection Time: 02/20/17  4:24 PM  Result Value Ref Range   Glucose-Capillary 231 (H) 65 - 99 mg/dL  Glucose, capillary     Status: Abnormal   Collection Time: 02/20/17  7:27 PM  Result Value Ref Range   Glucose-Capillary 280 (H) 65 - 99 mg/dL   Comment 1 Notify RN   Glucose, capillary     Status: Abnormal   Collection Time: 02/20/17  9:17 PM  Result Value Ref Range   Glucose-Capillary 268 (H) 65 - 99 mg/dL   Comment 1 Notify RN   CBC with Differential/Platelet     Status: Abnormal   Collection Time: 02/21/17  5:13 AM  Result Value Ref Range   WBC 13.1 (H) 4.0 - 10.5 K/uL   RBC 3.42 (L) 3.87 - 5.11 MIL/uL   Hemoglobin 10.2  (L) 12.0 - 15.0 g/dL   HCT 31.6 (L) 36.0 - 46.0 %   MCV 92.4 78.0 - 100.0 fL   MCH 29.8 26.0 - 34.0 pg   MCHC 32.3 30.0 - 36.0 g/dL   RDW 15.3 11.5 - 15.5 %   Platelets 220 150 - 400 K/uL   Neutrophils Relative % 93 %   Neutro Abs 12.3 (H) 1.7 - 7.7 K/uL   Lymphocytes Relative 5 %   Lymphs Abs 0.6 (L) 0.7 - 4.0 K/uL   Monocytes Relative 2 %   Monocytes Absolute 0.2 0.1 - 1.0 K/uL   Eosinophils Relative 0 %   Eosinophils Absolute 0.0 0.0 - 0.7 K/uL   Basophils Relative 0 %   Basophils Absolute 0.0 0.0 - 0.1 K/uL  Comprehensive metabolic panel     Status: Abnormal   Collection Time: 02/21/17  5:13 AM  Result Value Ref Range   Sodium 135 135 - 145 mmol/L   Potassium 4.9 3.5 - 5.1 mmol/L   Chloride 96 (L) 101 - 111 mmol/L   CO2 33 (H) 22 - 32 mmol/L   Glucose, Bld 312 (H) 65 - 99 mg/dL   BUN 17 6 - 20 mg/dL   Creatinine, Ser 0.46 0.44 - 1.00 mg/dL   Calcium 9.4 8.9 - 10.3 mg/dL   Total Protein 6.1 (L) 6.5 - 8.1  g/dL   Albumin 3.3 (L) 3.5 - 5.0 g/dL   AST 15 15 - 41 U/L   ALT 16 14 - 54 U/L   Alkaline Phosphatase 51 38 - 126 U/L   Total Bilirubin 0.3 0.3 - 1.2 mg/dL   GFR calc non Af Amer >60 >60 mL/min   GFR calc Af Amer >60 >60 mL/min    Comment: (NOTE) The eGFR has been calculated using the CKD EPI equation. This calculation has not been validated in all clinical situations. eGFR's persistently <60 mL/min signify possible Chronic Kidney Disease.    Anion gap 6 5 - 15  Magnesium     Status: None   Collection Time: 02/21/17  5:13 AM  Result Value Ref Range   Magnesium 1.7 1.7 - 2.4 mg/dL  Phosphorus     Status: None   Collection Time: 02/21/17  5:13 AM  Result Value Ref Range   Phosphorus 3.1 2.5 - 4.6 mg/dL  Glucose, capillary     Status: Abnormal   Collection Time: 02/21/17  8:09 AM  Result Value Ref Range   Glucose-Capillary 311 (H) 65 - 99 mg/dL  Glucose, capillary     Status: Abnormal   Collection Time: 02/21/17 10:40 AM  Result Value Ref Range    Glucose-Capillary 403 (H) 65 - 99 mg/dL  Glucose, capillary     Status: Abnormal   Collection Time: 02/21/17 11:40 AM  Result Value Ref Range   Glucose-Capillary 396 (H) 65 - 99 mg/dL  Glucose, capillary     Status: Abnormal   Collection Time: 02/21/17  1:41 PM  Result Value Ref Range   Glucose-Capillary 334 (H) 65 - 99 mg/dL  Glucose, capillary     Status: Abnormal   Collection Time: 02/21/17  4:26 PM  Result Value Ref Range   Glucose-Capillary 111 (H) 65 - 99 mg/dL   Comment 1 Notify RN    Comment 2 Document in Chart   Glucose, capillary     Status: Abnormal   Collection Time: 02/21/17  9:20 PM  Result Value Ref Range   Glucose-Capillary 253 (H) 65 - 99 mg/dL   Comment 1 Notify RN   CBC with Differential/Platelet     Status: Abnormal   Collection Time: 02/22/17  4:56 AM  Result Value Ref Range   WBC 15.0 (H) 4.0 - 10.5 K/uL   RBC 3.47 (L) 3.87 - 5.11 MIL/uL   Hemoglobin 10.3 (L) 12.0 - 15.0 g/dL   HCT 32.2 (L) 36.0 - 46.0 %   MCV 92.8 78.0 - 100.0 fL   MCH 29.7 26.0 - 34.0 pg   MCHC 32.0 30.0 - 36.0 g/dL   RDW 15.3 11.5 - 15.5 %   Platelets 237 150 - 400 K/uL   Neutrophils Relative % 93 %   Neutro Abs 14.0 (H) 1.7 - 7.7 K/uL   Lymphocytes Relative 6 %   Lymphs Abs 0.9 0.7 - 4.0 K/uL   Monocytes Relative 1 %   Monocytes Absolute 0.2 0.1 - 1.0 K/uL   Eosinophils Relative 0 %   Eosinophils Absolute 0.0 0.0 - 0.7 K/uL   Basophils Relative 0 %   Basophils Absolute 0.0 0.0 - 0.1 K/uL  Comprehensive metabolic panel     Status: Abnormal   Collection Time: 02/22/17  4:56 AM  Result Value Ref Range   Sodium 134 (L) 135 - 145 mmol/L   Potassium 4.5 3.5 - 5.1 mmol/L   Chloride 93 (L) 101 - 111 mmol/L   CO2 33 (  H) 22 - 32 mmol/L   Glucose, Bld 339 (H) 65 - 99 mg/dL   BUN 23 (H) 6 - 20 mg/dL   Creatinine, Ser 0.53 0.44 - 1.00 mg/dL   Calcium 9.3 8.9 - 10.3 mg/dL   Total Protein 6.1 (L) 6.5 - 8.1 g/dL   Albumin 3.3 (L) 3.5 - 5.0 g/dL   AST 13 (L) 15 - 41 U/L   ALT 13 (L) 14  - 54 U/L   Alkaline Phosphatase 50 38 - 126 U/L   Total Bilirubin 0.4 0.3 - 1.2 mg/dL   GFR calc non Af Amer >60 >60 mL/min   GFR calc Af Amer >60 >60 mL/min    Comment: (NOTE) The eGFR has been calculated using the CKD EPI equation. This calculation has not been validated in all clinical situations. eGFR's persistently <60 mL/min signify possible Chronic Kidney Disease.    Anion gap 8 5 - 15  Magnesium     Status: Abnormal   Collection Time: 02/22/17  4:56 AM  Result Value Ref Range   Magnesium 1.6 (L) 1.7 - 2.4 mg/dL  Phosphorus     Status: None   Collection Time: 02/22/17  4:56 AM  Result Value Ref Range   Phosphorus 4.0 2.5 - 4.6 mg/dL    ABGS  Recent Labs  02/20/17 0530  PHART 7.398  PO2ART 85.6  HCO3 32.2*   CULTURES Recent Results (from the past 240 hour(s))  MRSA PCR Screening     Status: None   Collection Time: 02/18/17  6:15 PM  Result Value Ref Range Status   MRSA by PCR NEGATIVE NEGATIVE Final    Comment:        The GeneXpert MRSA Assay (FDA approved for NASAL specimens only), is one component of a comprehensive MRSA colonization surveillance program. It is not intended to diagnose MRSA infection nor to guide or monitor treatment for MRSA infections.   Respiratory Panel by PCR     Status: None   Collection Time: 02/19/17  4:00 PM  Result Value Ref Range Status   Adenovirus NOT DETECTED NOT DETECTED Final   Coronavirus 229E NOT DETECTED NOT DETECTED Final   Coronavirus HKU1 NOT DETECTED NOT DETECTED Final   Coronavirus NL63 NOT DETECTED NOT DETECTED Final   Coronavirus OC43 NOT DETECTED NOT DETECTED Final   Metapneumovirus NOT DETECTED NOT DETECTED Final   Rhinovirus / Enterovirus NOT DETECTED NOT DETECTED Final   Influenza A NOT DETECTED NOT DETECTED Final   Influenza B NOT DETECTED NOT DETECTED Final   Parainfluenza Virus 1 NOT DETECTED NOT DETECTED Final   Parainfluenza Virus 2 NOT DETECTED NOT DETECTED Final   Parainfluenza Virus 3 NOT  DETECTED NOT DETECTED Final   Parainfluenza Virus 4 NOT DETECTED NOT DETECTED Final   Respiratory Syncytial Virus NOT DETECTED NOT DETECTED Final   Bordetella pertussis NOT DETECTED NOT DETECTED Final   Chlamydophila pneumoniae NOT DETECTED NOT DETECTED Final   Mycoplasma pneumoniae NOT DETECTED NOT DETECTED Final    Comment: Performed at [facility], [place]. [address], [place], [place] [address]   Studies/Results: No results found.  Medications:  Prior to Admission:  Prescriptions Prior to Admission  Medication Sig Dispense Refill Last Dose  . ACCU-CHEK AVIVA PLUS test strip USE TO TEST 3 TO 4 TIMES DAILY OR AS DIRECTED. 100 each PRN Taking  . acetaminophen (TYLENOL) 500 MG tablet Take 500 mg by mouth every 6 (six) hours as needed for mild pain.   Taking  .  albuterol (PROAIR HFA) 108 (90 BASE) MCG/ACT inhaler Inhale 2 puffs into the lungs every 6 (six) hours as needed for wheezing or shortness of breath. 18 g 11 02/18/2017 at Unknown time  . ALPRAZolam (XANAX) 1 MG tablet Take 1 mg by mouth 4 (four) times daily.    02/18/2017 at Unknown time  . aspirin EC 81 MG tablet Take 81 mg by mouth daily.   02/18/2017 at Unknown time  . atorvastatin (LIPITOR) 40 MG tablet Take 1 tablet (40 mg total) by mouth daily. KEEP OV. 90 tablet 1 02/18/2017 at Unknown time  . DIGOX 125 MCG tablet TAKE ONE TABLET BY MOUTH ONCE DAILY. 30 tablet 10 02/18/2017 at Unknown time  . fluticasone (FLONASE) 50 MCG/ACT nasal spray Place 2 sprays into both nostrils daily. (Patient taking differently: Place 2 sprays into both nostrils daily as needed for allergies. ) 16 g 6 Taking  . furosemide (LASIX) 40 MG tablet TAKE ONE TABLET BY MOUTH ONCE DAILY. 30 tablet 6 02/18/2017 at Unknown time  . gabapentin (NEURONTIN) 100 MG capsule Take 1 capsule by mouth 3 (three) times daily.   02/18/2017 at Unknown time  . glipiZIDE (GLUCOTROL XL) 2.5 MG 24 hr tablet 2.5 mg daily.    02/18/2017 at Unknown time  . JANUVIA 100 MG tablet TAKE ONE  TABLET BY MOUTH DAILY. 30 tablet 0 02/18/2017 at Unknown time  . lisinopril (PRINIVIL,ZESTRIL) 2.5 MG tablet Take 1 tablet (2.5 mg total) by mouth daily. 30 tablet 11 02/18/2017 at Unknown time  . Magnesium Oxide 250 MG TABS Take 250 mg by mouth daily.    02/18/2017 at Unknown time  . metFORMIN (GLUCOPHAGE) 850 MG tablet TAKE 1 TABLET BY MOUTH TWICE DAILY WITH MEALS. 60 tablet 0 02/18/2017 at Unknown time  . metoprolol (LOPRESSOR) 50 MG tablet Take 2 tablets in the morning and 1 1/2 tablets at night 105 tablet 11 02/18/2017 at 700  . montelukast (SINGULAIR) 10 MG tablet Take 1 tablet by mouth daily.   02/18/2017 at Unknown time  . Multiple Vitamin (MULTIVITAMIN) capsule Take 1 capsule by mouth daily.   02/18/2017 at Unknown time  . potassium chloride SA (K-DUR,KLOR-CON) 20 MEQ tablet Take 1 tablet (20 mEq total) by mouth daily. 30 tablet 0 02/18/2017 at Unknown time  . PRADAXA 150 MG CAPS capsule TAKE ONE CAPSULE BY MOUTH TWICE DAILY. 60 capsule 10 02/18/2017 at 700  . sodium chloride (OCEAN) 0.65 % nasal spray Place 1 spray into the nose as needed for congestion. Congestion   Taking  . SYMBICORT 160-4.5 MCG/ACT inhaler INHALE 2 PUFFS INTO THE LUNGS TWICE DAILY. RINSE MOUTH AFTER USE. 10.2 g 0 02/18/2017 at Unknown time   Scheduled: . acetylcysteine  3 mL Nebulization Q6H  . ALPRAZolam  1 mg Oral QID  . aspirin EC  81 mg Oral Daily  . atorvastatin  40 mg Oral q1800  . dabigatran  150 mg Oral BID  . dextromethorphan-guaiFENesin  1 tablet Oral BID  . digoxin  125 mcg Oral Daily  . feeding supplement (GLUCERNA SHAKE)  237 mL Oral BID BM  . gabapentin  100 mg Oral TID  . insulin aspart  0-20 Units Subcutaneous TID WC  . insulin aspart  0-5 Units Subcutaneous QHS  . insulin aspart  5 Units Subcutaneous TID WC  . insulin glargine  15 Units Subcutaneous Daily  . ipratropium  0.5 mg Nebulization Q6H  . levalbuterol  0.63 mg Nebulization Q6H  . lisinopril  2.5 mg Oral Daily  .  magnesium oxide  200 mg Oral  Daily  . mouth rinse  15 mL Mouth Rinse BID  . methylPREDNISolone (SOLU-MEDROL) injection  60 mg Intravenous Q6H  . metoprolol tartrate  100 mg Oral Daily   And  . metoprolol tartrate  75 mg Oral QHS  . mometasone-formoterol  2 puff Inhalation BID  . montelukast  10 mg Oral Daily  . sodium chloride HYPERTONIC  4 mL Nebulization Daily   Continuous: . azithromycin 500 mg (02/21/17 1716)   LNL:[id number] **OR** acetaminophen, albuterol, fluticasone, ondansetron **OR** ondansetron (ZOFRAN) IV, sodium chloride  Assesment: She was admitted with acute on chronic hypoxic respiratory failure. She had COPD exacerbation. She had atrial fibrillation rapid ventricular response. At baseline she has diastolic heart failure but that does not seem to have been acute now. She is improving. Agree with continuing to wean oxygen as tolerated. Principal Problem:   Acute on chronic respiratory failure with hypoxia (HCC) Active Problems:   Diabetes mellitus (HCC)   Diastolic congestive heart failure (HCC)   Atrial fibrillation with rapid ventricular response (HCC)   COPD with acute exacerbation (HCC)   Hypomagnesemia   Malnutrition of moderate degree    Plan: Continue current treatments.    LOS: 4 days   Tiyon Sanor L 02/22/2017, 8:42 AM

## 2017-02-22 NOTE — Progress Notes (Signed)
Inpatient Diabetes Program Recommendations  AACE/ADA: New Consensus Statement on Inpatient Glycemic Control (2015)  Target Ranges:  Prepandial:   less than 140 mg/dL      Peak postprandial:   less than 180 mg/dL (1-2 hours)      Critically ill patients:  140 - 180 mg/dL  Results for ZENDA, HERSKOWITZ (MRN [id number]) as of 02/22/2017 07:18  Ref. Range 02/22/2017 04:56  Glucose Latest Ref Range: 65 - 99 mg/dL 339 (H)   Results for Mary [name], Mary [name] (MRN [id number]) as of 02/22/2017 07:18  Ref. Range 12-27-2016 08:09 12-27-2016 10:40 12-27-2016 11:40 12-27-2016 13:41 12-27-2016 16:26 12-27-2016 21:20  Glucose-Capillary Latest Ref Range: 65 - 99 mg/dL 311 (H) 403 (H) 396 (H) 334 (H) 111 (H) 253 (H)    Review of Glycemic Control  Diabetes history: DM2 Outpatient Diabetes medications: Glipizide 2.5 mg daily, Januvia 100 mg daily, Metformin 850 mg BID Current orders for Inpatient glycemic control: Lantus 10 units daily, Novolog 0-20 units TID with meals, Novolog 0-5 units QHS  Inpatient Diabetes Program Recommendations: Insulin - Basal: Please consider increasing Lantus to 15 units daily (starting today). Insulin - Meal Coverage: Please consider ordering Novolog 5 units TID with meals for meal coverage if patient eats at least 50% of meals.  NOTE: Ordered Solumedrol 60 mg Q6H which is contributing to hyperglycemia. Recommend increasing Lantus and ordering Novolog meal coverage as noted above. Please keep in mind the as steroids are tapered, insulin may also need to be adjusted.  Thanks, [name], RN, MSN, CDE Diabetes Coordinator Inpatient Diabetes Program [phone] (Team Pager from 8am to 5pm)

## 2017-02-22 NOTE — Progress Notes (Signed)
OT Cancellation Note  Patient Details Name: MELIYAH SIMON MRN: [id number] DOB: [birth year]   Cancelled Treatment:    Reason Eval/Treat Not Completed: Patient at procedure or test/ unavailable. Pt unable to participate in OT evaluation as she is receiving a breathing treatment at this time. Will re-attempt evaluation at a later time.    Ailene Ravel, OTR/L,CBIS  [phone]  02/22/2017, 9:22 AM

## 2017-02-23 LAB — COMPREHENSIVE METABOLIC PANEL
ALT: 15 U/L (ref 14–54)
ANION GAP: 7 (ref 5–15)
AST: 13 U/L — ABNORMAL LOW (ref 15–41)
Albumin: 3.3 g/dL — ABNORMAL LOW (ref 3.5–5.0)
Alkaline Phosphatase: 49 U/L (ref 38–126)
BUN: 26 mg/dL — ABNORMAL HIGH (ref 6–20)
CHLORIDE: 93 mmol/L — AB (ref 101–111)
CO2: 33 mmol/L — ABNORMAL HIGH (ref 22–32)
Calcium: 9.4 mg/dL (ref 8.9–10.3)
Creatinine, Ser: 0.52 mg/dL (ref 0.44–1.00)
Glucose, Bld: 301 mg/dL — ABNORMAL HIGH (ref 65–99)
POTASSIUM: 4.4 mmol/L (ref 3.5–5.1)
Sodium: 133 mmol/L — ABNORMAL LOW (ref 135–145)
Total Bilirubin: 0.5 mg/dL (ref 0.3–1.2)
Total Protein: 6 g/dL — ABNORMAL LOW (ref 6.5–8.1)

## 2017-02-23 LAB — CBC WITH DIFFERENTIAL/PLATELET
BASOS ABS: 0 10*3/uL (ref 0.0–0.1)
Basophils Relative: 0 %
EOS ABS: 0 10*3/uL (ref 0.0–0.7)
EOS PCT: 0 %
HCT: 33.8 % — ABNORMAL LOW (ref 36.0–46.0)
HEMOGLOBIN: 11.1 g/dL — AB (ref 12.0–15.0)
Lymphocytes Relative: 4 %
Lymphs Abs: 0.8 10*3/uL (ref 0.7–4.0)
MCH: 30.1 pg (ref 26.0–34.0)
MCHC: 32.8 g/dL (ref 30.0–36.0)
MCV: 91.6 fL (ref 78.0–100.0)
Monocytes Absolute: 0.5 10*3/uL (ref 0.1–1.0)
Monocytes Relative: 3 %
Neutro Abs: 17.4 10*3/uL — ABNORMAL HIGH (ref 1.7–7.7)
Neutrophils Relative %: 93 %
PLATELETS: 245 10*3/uL (ref 150–400)
RBC: 3.69 MIL/uL — AB (ref 3.87–5.11)
RDW: 15.4 % (ref 11.5–15.5)
WBC: 18.7 10*3/uL — ABNORMAL HIGH (ref 4.0–10.5)

## 2017-02-23 LAB — MAGNESIUM: Magnesium: 1.9 mg/dL (ref 1.7–2.4)

## 2017-02-23 LAB — GLUCOSE, CAPILLARY
GLUCOSE-CAPILLARY: 300 mg/dL — AB (ref 65–99)
Glucose-Capillary: 415 mg/dL — ABNORMAL HIGH (ref 65–99)
Glucose-Capillary: 345 mg/dL — ABNORMAL HIGH (ref 65–99)
Glucose-Capillary: 74 mg/dL (ref 65–99)

## 2017-02-23 LAB — PHOSPHORUS: PHOSPHORUS: 3.9 mg/dL (ref 2.5–4.6)

## 2017-02-23 MED ORDER — AZITHROMYCIN 250 MG PO TABS
500.0000 mg | ORAL_TABLET | Freq: Every day | ORAL | Status: DC
  Administered 2017-02-23 – 2017-02-24 (×2): 500 mg via ORAL
  Filled 2017-02-23 (×2): qty 2

## 2017-02-23 MED ORDER — KETOROLAC TROMETHAMINE 30 MG/ML IJ SOLN
30.0000 mg | Freq: Once | INTRAMUSCULAR | Status: AC
  Administered 2017-02-23: 30 mg via INTRAVENOUS
  Filled 2017-02-23: qty 1

## 2017-02-23 MED ORDER — INSULIN GLARGINE 100 UNIT/ML ~~LOC~~ SOLN
20.0000 [IU] | Freq: Every day | SUBCUTANEOUS | Status: DC
  Administered 2017-02-23 – 2017-02-24 (×2): 20 [IU] via SUBCUTANEOUS
  Filled 2017-02-23 (×3): qty 0.2

## 2017-02-23 MED ORDER — INSULIN ASPART 100 UNIT/ML ~~LOC~~ SOLN
8.0000 [IU] | Freq: Three times a day (TID) | SUBCUTANEOUS | Status: DC
  Administered 2017-02-23 – 2017-02-24 (×4): 8 [IU] via SUBCUTANEOUS

## 2017-02-23 NOTE — Progress Notes (Signed)
Physical Therapy Treatment Patient Details Name: Mary [name] MRN: [id number] DOB: 02/13/1951 Today's Date: 02/23/2017    History of Present Illness 66 y.o. female with medical history significant of COPD not apparently on home O2; chronic systolic HF (EF 79-02% in 2015), afib on Pradaxa; DM; and anxiety presenting to the hospital with worsening SOB.  She has acute on chronic hypoxic respiratory failure. She has COPD exacerbation.  She had CT of the chest which I have personally reviewed for pulmonary  embolism and it did not show pulmonary embolism but did show some mediastinal adenopathy. She also had areas in the superior segment of the right upper lobe all of this is concerning that she may have a primary lung cancer.    PT Comments    Pt received in bed, and was agreeable to PT tx.  Pt was able to significantly improve gait distance today, and ambulated 236ft with quad cane.  She does demonstrate balance deficits with occasional LOB and recovery via stepping strategy, as well as holding her L hand in high guarded position while also touching the wall rail and other objects along the way.  Continue to recommend HHPT as she does not drive.     Follow Up Recommendations  Home health PT     Equipment Recommendations       Recommendations for Other Services       Precautions / Restrictions Precautions Precautions: Fall Precaution Comments: 1 fall - due to orthostatic hypotension after starting a new BP medication.  Restrictions Weight Bearing Restrictions: No    Mobility  Bed Mobility Overal bed mobility: Modified Independent Bed Mobility: Supine to Sit     Supine to sit: Supervision        Transfers Overall transfer level: Needs assistance Equipment used: Rolling walker (2 wheeled) Transfers: Sit to/from Stand Sit to Stand: Min guard            Ambulation/Gait Ambulation/Gait assistance: Min guard Ambulation Distance (Feet): 200 Feet Assistive device: Quad  cane Gait Pattern/deviations: Step-through pattern     General Gait Details: Pt demonstrates occasional LOB with step strategy to recover.  Holding cane in R hand and L hand is in high guard position.  Pt occasionally touching the wall rail or objects in the hallway to steady herself.    Stairs            Wheelchair Mobility    Modified Rankin (Stroke Patients Only)       Balance Overall balance assessment: History of Falls;Needs assistance Sitting-balance support: Feet supported Sitting balance-Leahy Scale: Good     Standing balance support: Bilateral upper extremity supported Standing balance-Leahy Scale: Fair                              Cognition Arousal/Alertness: Awake/alert Behavior During Therapy: WFL for tasks assessed/performed Overall Cognitive Status: Within Functional Limits for tasks assessed                                        Exercises General Exercises - Lower Extremity Ankle Circles/Pumps: Strengthening;Both;Seated;Other (comment) (x1 min) Long Arc Quad: Strengthening;Both;10 reps;Seated Hip Flexion/Marching: Strengthening;Both;10 reps;Seated    General Comments        Pertinent Vitals/Pain Pain Assessment: No/denies pain    Home Living  Prior Function            PT Goals (current goals can now be found in the care plan section) Acute Rehab PT Goals Patient Stated Goal: To get stronger PT Goal Formulation: With patient Time For Goal Achievement: 02/26/17 Potential to Achieve Goals: Fair Progress towards PT goals: Progressing toward goals    Frequency    Min 3X/week      PT Plan Current plan remains appropriate    Co-evaluation              AM-PAC PT "6 Clicks" Daily Activity  Outcome Measure  Difficulty turning over in bed (including adjusting bedclothes, sheets and blankets)?: None Difficulty moving from lying on back to sitting on the side of the bed?  : None Difficulty sitting down on and standing up from a chair with arms (e.g., wheelchair, bedside commode, etc,.)?: A Little Help needed moving to and from a bed to chair (including a wheelchair)?: A Little Help needed walking in hospital room?: A Little Help needed climbing 3-5 steps with a railing? : A Little 6 Click Score: 20    End of Session Equipment Utilized During Treatment: Gait belt;Oxygen Activity Tolerance: Patient limited by fatigue Patient left: in chair;with call bell/phone within reach Nurse Communication: Mobility status PT Visit Diagnosis: Muscle weakness (generalized) (M62.81);Other abnormalities of gait and mobility (R26.89)     Time: 1642-9037 PT Time Calculation (min) (ACUTE ONLY): 26 min  Charges:  $Gait Training: 8-22 mins $Therapeutic Exercise: 8-22 mins                    G Codes:       Beth Nikholas Geffre, PT, DPT X: [phone]

## 2017-02-23 NOTE — Progress Notes (Signed)
Subjective: She says she feels better. She wants to go home. She has no new complaints. No chest pain nausea vomiting diarrhea.  Objective: Vital signs in last 24 hours: Temp:  [97.9 F (36.6 C)-98.2 F (36.8 C)] 97.9 F (36.6 C) (05/01 4580) Pulse Rate:  [67-113] 91 (05/01 0638) Resp:  [18-20] 18 (05/01 9983) BP: (130-132)/(68-79) 131/79 (05/01 3825) SpO2:  [94 %-99 %] 94 % (05/01 0731) Weight change:  Last BM Date: 02/18/17 (per pt, per RN staff)  Intake/Output from previous day: 04/30 0701 - 05/01 0700 In: 1030 [P.O.:480; IV Piggyback:550] Out: -   PHYSICAL EXAM General appearance: alert, cooperative and no distress Resp: Clear with diminished breath sounds Cardio: irregularly irregular rhythm GI: soft, non-tender; bowel sounds normal; no masses,  no organomegaly Extremities: extremities normal, atraumatic, no cyanosis or edema Skin warm and dry. Mucous membranes are moist  Lab Results:  Results for orders placed or performed during the hospital encounter of 02/18/17 (from the past 48 hour(s))  Glucose, capillary     Status: Abnormal   Collection Time: 02/21/17 10:40 AM  Result Value Ref Range   Glucose-Capillary 403 (H) 65 - 99 mg/dL  Glucose, capillary     Status: Abnormal   Collection Time: 02/21/17 11:40 AM  Result Value Ref Range   Glucose-Capillary 396 (H) 65 - 99 mg/dL  Glucose, capillary     Status: Abnormal   Collection Time: 02/21/17  1:41 PM  Result Value Ref Range   Glucose-Capillary 334 (H) 65 - 99 mg/dL  Glucose, capillary     Status: Abnormal   Collection Time: 02/21/17  4:26 PM  Result Value Ref Range   Glucose-Capillary 111 (H) 65 - 99 mg/dL   Comment 1 Notify RN    Comment 2 Document in Chart   Glucose, capillary     Status: Abnormal   Collection Time: 02/21/17  9:20 PM  Result Value Ref Range   Glucose-Capillary 253 (H) 65 - 99 mg/dL   Comment 1 Notify RN   CBC with Differential/Platelet     Status: Abnormal   Collection Time: 02/22/17   4:56 AM  Result Value Ref Range   WBC 15.0 (H) 4.0 - 10.5 K/uL   RBC 3.47 (L) 3.87 - 5.11 MIL/uL   Hemoglobin 10.3 (L) 12.0 - 15.0 g/dL   HCT 32.2 (L) 36.0 - 46.0 %   MCV 92.8 78.0 - 100.0 fL   MCH 29.7 26.0 - 34.0 pg   MCHC 32.0 30.0 - 36.0 g/dL   RDW 15.3 11.5 - 15.5 %   Platelets 237 150 - 400 K/uL   Neutrophils Relative % 93 %   Neutro Abs 14.0 (H) 1.7 - 7.7 K/uL   Lymphocytes Relative 6 %   Lymphs Abs 0.9 0.7 - 4.0 K/uL   Monocytes Relative 1 %   Monocytes Absolute 0.2 0.1 - 1.0 K/uL   Eosinophils Relative 0 %   Eosinophils Absolute 0.0 0.0 - 0.7 K/uL   Basophils Relative 0 %   Basophils Absolute 0.0 0.0 - 0.1 K/uL  Comprehensive metabolic panel     Status: Abnormal   Collection Time: 02/22/17  4:56 AM  Result Value Ref Range   Sodium 134 (L) 135 - 145 mmol/L   Potassium 4.5 3.5 - 5.1 mmol/L   Chloride 93 (L) 101 - 111 mmol/L   CO2 33 (H) 22 - 32 mmol/L   Glucose, Bld 339 (H) 65 - 99 mg/dL   BUN 23 (H) 6 - 20 mg/dL  Creatinine, Ser 0.53 0.44 - 1.00 mg/dL   Calcium 9.3 8.9 - 10.3 mg/dL   Total Protein 6.1 (L) 6.5 - 8.1 g/dL   Albumin 3.3 (L) 3.5 - 5.0 g/dL   AST 13 (L) 15 - 41 U/L   ALT 13 (L) 14 - 54 U/L   Alkaline Phosphatase 50 38 - 126 U/L   Total Bilirubin 0.4 0.3 - 1.2 mg/dL   GFR calc non Af Amer >60 >60 mL/min   GFR calc Af Amer >60 >60 mL/min    Comment: (NOTE) The eGFR has been calculated using the CKD EPI equation. This calculation has not been validated in all clinical situations. eGFR's persistently <60 mL/min signify possible Chronic Kidney Disease.    Anion gap 8 5 - 15  Magnesium     Status: Abnormal   Collection Time: 02/22/17  4:56 AM  Result Value Ref Range   Magnesium 1.6 (L) 1.7 - 2.4 mg/dL  Phosphorus     Status: None   Collection Time: 02/22/17  4:56 AM  Result Value Ref Range   Phosphorus 4.0 2.5 - 4.6 mg/dL  Glucose, capillary     Status: Abnormal   Collection Time: 02/22/17 10:13 AM  Result Value Ref Range   Glucose-Capillary  464 (H) 65 - 99 mg/dL  Glucose, capillary     Status: Abnormal   Collection Time: 02/22/17 11:59 AM  Result Value Ref Range   Glucose-Capillary 416 (H) 65 - 99 mg/dL   Comment 1 Notify RN   Glucose, capillary     Status: Abnormal   Collection Time: 02/22/17  3:34 PM  Result Value Ref Range   Glucose-Capillary 268 (H) 65 - 99 mg/dL  Glucose, capillary     Status: Abnormal   Collection Time: 02/22/17  4:56 PM  Result Value Ref Range   Glucose-Capillary 221 (H) 65 - 99 mg/dL   Comment 1 Notify RN   Glucose, capillary     Status: Abnormal   Collection Time: 02/22/17  9:20 PM  Result Value Ref Range   Glucose-Capillary 128 (H) 65 - 99 mg/dL   Comment 1 Notify RN    Comment 2 Document in Chart   CBC with Differential/Platelet     Status: Abnormal   Collection Time: 02/23/17  5:22 AM  Result Value Ref Range   WBC 18.7 (H) 4.0 - 10.5 K/uL   RBC 3.69 (L) 3.87 - 5.11 MIL/uL   Hemoglobin 11.1 (L) 12.0 - 15.0 g/dL   HCT 33.8 (L) 36.0 - 46.0 %   MCV 91.6 78.0 - 100.0 fL   MCH 30.1 26.0 - 34.0 pg   MCHC 32.8 30.0 - 36.0 g/dL   RDW 15.4 11.5 - 15.5 %   Platelets 245 150 - 400 K/uL   Neutrophils Relative % 93 %   Neutro Abs 17.4 (H) 1.7 - 7.7 K/uL   Lymphocytes Relative 4 %   Lymphs Abs 0.8 0.7 - 4.0 K/uL   Monocytes Relative 3 %   Monocytes Absolute 0.5 0.1 - 1.0 K/uL   Eosinophils Relative 0 %   Eosinophils Absolute 0.0 0.0 - 0.7 K/uL   Basophils Relative 0 %   Basophils Absolute 0.0 0.0 - 0.1 K/uL  Comprehensive metabolic panel     Status: Abnormal   Collection Time: 02/23/17  5:22 AM  Result Value Ref Range   Sodium 133 (L) 135 - 145 mmol/L   Potassium 4.4 3.5 - 5.1 mmol/L   Chloride 93 (L) 101 - 111 mmol/L  CO2 33 (H) 22 - 32 mmol/L   Glucose, Bld 301 (H) 65 - 99 mg/dL   BUN 26 (H) 6 - 20 mg/dL   Creatinine, Ser 0.52 0.44 - 1.00 mg/dL   Calcium 9.4 8.9 - 10.3 mg/dL   Total Protein 6.0 (L) 6.5 - 8.1 g/dL   Albumin 3.3 (L) 3.5 - 5.0 g/dL   AST 13 (L) 15 - 41 U/L   ALT 15  14 - 54 U/L   Alkaline Phosphatase 49 38 - 126 U/L   Total Bilirubin 0.5 0.3 - 1.2 mg/dL   GFR calc non Af Amer >60 >60 mL/min   GFR calc Af Amer >60 >60 mL/min    Comment: (NOTE) The eGFR has been calculated using the CKD EPI equation. This calculation has not been validated in all clinical situations. eGFR's persistently <60 mL/min signify possible Chronic Kidney Disease.    Anion gap 7 5 - 15  Magnesium     Status: None   Collection Time: 02/23/17  5:22 AM  Result Value Ref Range   Magnesium 1.9 1.7 - 2.4 mg/dL  Phosphorus     Status: None   Collection Time: 02/23/17  5:22 AM  Result Value Ref Range   Phosphorus 3.9 2.5 - 4.6 mg/dL  Glucose, capillary     Status: Abnormal   Collection Time: 02/23/17  9:16 AM  Result Value Ref Range   Glucose-Capillary 415 (H) 65 - 99 mg/dL   Comment 1 Notify RN     ABGS No results for input(s): PHART, PO2ART, TCO2, HCO3 in the last 72 hours.  Invalid input(s): PCO2 CULTURES Recent Results (from the past 240 hour(s))  MRSA PCR Screening     Status: None   Collection Time: 02/18/17  6:15 PM  Result Value Ref Range Status   MRSA by PCR NEGATIVE NEGATIVE Final    Comment:        The GeneXpert MRSA Assay (FDA approved for NASAL specimens only), is one component of a comprehensive MRSA colonization surveillance program. It is not intended to diagnose MRSA infection nor to guide or monitor treatment for MRSA infections.   Respiratory Panel by PCR     Status: None   Collection Time: 02/19/17  4:00 PM  Result Value Ref Range Status   Adenovirus NOT DETECTED NOT DETECTED Final   Coronavirus 229E NOT DETECTED NOT DETECTED Final   Coronavirus HKU1 NOT DETECTED NOT DETECTED Final   Coronavirus NL63 NOT DETECTED NOT DETECTED Final   Coronavirus OC43 NOT DETECTED NOT DETECTED Final   Metapneumovirus NOT DETECTED NOT DETECTED Final   Rhinovirus / Enterovirus NOT DETECTED NOT DETECTED Final   Influenza A NOT DETECTED NOT DETECTED Final    Influenza B NOT DETECTED NOT DETECTED Final   Parainfluenza Virus 1 NOT DETECTED NOT DETECTED Final   Parainfluenza Virus 2 NOT DETECTED NOT DETECTED Final   Parainfluenza Virus 3 NOT DETECTED NOT DETECTED Final   Parainfluenza Virus 4 NOT DETECTED NOT DETECTED Final   Respiratory Syncytial Virus NOT DETECTED NOT DETECTED Final   Bordetella pertussis NOT DETECTED NOT DETECTED Final   Chlamydophila pneumoniae NOT DETECTED NOT DETECTED Final   Mycoplasma pneumoniae NOT DETECTED NOT DETECTED Final    Comment: Performed at [facility], [place]. [address] [place], [place] [address]   Studies/Results: No results found.  Medications:  Prior to Admission:  Prescriptions Prior to Admission  Medication Sig Dispense Refill Last Dose  . ACCU-CHEK AVIVA PLUS test strip USE TO TEST  3 TO 4 TIMES DAILY OR AS DIRECTED. 100 each PRN Taking  . acetaminophen (TYLENOL) 500 MG tablet Take 500 mg by mouth every 6 (six) hours as needed for mild pain.   Taking  . albuterol (PROAIR HFA) 108 (90 BASE) MCG/ACT inhaler Inhale 2 puffs into the lungs every 6 (six) hours as needed for wheezing or shortness of breath. 18 g 11 02/18/2017 at Unknown time  . ALPRAZolam (XANAX) 1 MG tablet Take 1 mg by mouth 4 (four) times daily.    02/18/2017 at Unknown time  . aspirin EC 81 MG tablet Take 81 mg by mouth daily.   02/18/2017 at Unknown time  . atorvastatin (LIPITOR) 40 MG tablet Take 1 tablet (40 mg total) by mouth daily. KEEP OV. 90 tablet 1 02/18/2017 at Unknown time  . DIGOX 125 MCG tablet TAKE ONE TABLET BY MOUTH ONCE DAILY. 30 tablet 10 02/18/2017 at Unknown time  . fluticasone (FLONASE) 50 MCG/ACT nasal spray Place 2 sprays into both nostrils daily. (Patient taking differently: Place 2 sprays into both nostrils daily as needed for allergies. ) 16 g 6 Taking  . furosemide (LASIX) 40 MG tablet TAKE ONE TABLET BY MOUTH ONCE DAILY. 30 tablet 6 02/18/2017 at Unknown time  . gabapentin (NEURONTIN) 100 MG capsule Take 1 capsule  by mouth 3 (three) times daily.   02/18/2017 at Unknown time  . glipiZIDE (GLUCOTROL XL) 2.5 MG 24 hr tablet 2.5 mg daily.    02/18/2017 at Unknown time  . JANUVIA 100 MG tablet TAKE ONE TABLET BY MOUTH DAILY. 30 tablet 0 02/18/2017 at Unknown time  . lisinopril (PRINIVIL,ZESTRIL) 2.5 MG tablet Take 1 tablet (2.5 mg total) by mouth daily. 30 tablet 11 02/18/2017 at Unknown time  . Magnesium Oxide 250 MG TABS Take 250 mg by mouth daily.    02/18/2017 at Unknown time  . metFORMIN (GLUCOPHAGE) 850 MG tablet TAKE 1 TABLET BY MOUTH TWICE DAILY WITH MEALS. 60 tablet 0 02/18/2017 at Unknown time  . metoprolol (LOPRESSOR) 50 MG tablet Take 2 tablets in the morning and 1 1/2 tablets at night 105 tablet 11 02/18/2017 at 700  . montelukast (SINGULAIR) 10 MG tablet Take 1 tablet by mouth daily.   02/18/2017 at Unknown time  . Multiple Vitamin (MULTIVITAMIN) capsule Take 1 capsule by mouth daily.   02/18/2017 at Unknown time  . potassium chloride SA (K-DUR,KLOR-CON) 20 MEQ tablet Take 1 tablet (20 mEq total) by mouth daily. 30 tablet 0 02/18/2017 at Unknown time  . PRADAXA 150 MG CAPS capsule TAKE ONE CAPSULE BY MOUTH TWICE DAILY. 60 capsule 10 02/18/2017 at 700  . sodium chloride (OCEAN) 0.65 % nasal spray Place 1 spray into the nose as needed for congestion. Congestion   Taking  . SYMBICORT 160-4.5 MCG/ACT inhaler INHALE 2 PUFFS INTO THE LUNGS TWICE DAILY. RINSE MOUTH AFTER USE. 10.2 g 0 02/18/2017 at Unknown time   Scheduled: . acetylcysteine  3 mL Nebulization Q6H  . ALPRAZolam  1 mg Oral QID  . aspirin EC  81 mg Oral Daily  . atorvastatin  40 mg Oral q1800  . azithromycin  500 mg Oral Daily  . dabigatran  150 mg Oral BID  . digoxin  125 mcg Oral Daily  . feeding supplement (GLUCERNA SHAKE)  237 mL Oral BID BM  . gabapentin  100 mg Oral TID  . guaiFENesin  1,200 mg Oral BID  . insulin aspart  0-20 Units Subcutaneous TID WC  . insulin aspart  0-5 Units  Subcutaneous QHS  . insulin aspart  8 Units Subcutaneous  Once  . insulin aspart  8 Units Subcutaneous TID WC  . insulin glargine  20 Units Subcutaneous Daily  . ipratropium  0.5 mg Nebulization Q6H  . levalbuterol  0.63 mg Nebulization Q6H  . lisinopril  2.5 mg Oral Daily  . magnesium oxide  200 mg Oral Daily  . mouth rinse  15 mL Mouth Rinse BID  . methylPREDNISolone (SOLU-MEDROL) injection  60 mg Intravenous Q12H  . metoprolol tartrate  100 mg Oral Daily   And  . metoprolol tartrate  75 mg Oral QHS  . mometasone-formoterol  2 puff Inhalation BID  . montelukast  10 mg Oral Daily  . sodium chloride HYPERTONIC  4 mL Nebulization Daily   Continuous:  HNG:[id number] **OR** acetaminophen, albuterol, fluticasone, menthol-cetylpyridinium, ondansetron **OR** ondansetron (ZOFRAN) IV, sodium chloride  Assesment: She was admitted with acute on chronic hypoxic respiratory failure. She required BiPAP but she has improved. She has COPD exacerbation. She had atrial fibrillation with rapid ventricular response. She has improved substantially. She still feels like she needs to cough up some sputum. Her chest is pretty clear so I'm not sure that she actually has any secretions to cough up now. Principal Problem:   Acute on chronic respiratory failure with hypoxia (HCC) Active Problems:   Diabetes mellitus (HCC)   Diastolic congestive heart failure (HCC)   Atrial fibrillation with rapid ventricular response (HCC)   COPD with acute exacerbation (HCC)   Hypomagnesemia   Malnutrition of moderate degree    Plan: I will plan to follow more peripherally. Thanks for allowing me to see her with you    LOS: 5 days   Dominyk Law L 02/23/2017, 9:28 AM

## 2017-02-23 NOTE — Consult Note (Signed)
   [facility] CM Inpatient Consult   02/23/2017  OTHELL DILUZIO [birth year] [id number]   Chart review revealed patient eligible for [place] Management services and post hospital discharge follow up related to a diagnosis of COPD. Patient was evaluated for community based chronic disease management services with [facility] care Management Program as a benefit of patient's [employer]. Met with the patient at the bedside to explain [place] Management services. Patient endorses her primary care provider to be Allyn Kenner, MD. Patient states she previously received [facility] services and they were helpful. She is anxious over the potential of having to go home with 02 and feels it would be helpful to have [facility] services again. Consent form signed. Patient gave [phone]) and [phone]) as the best number to reach her. She also gave written permission to call her granddaughter Berneice Gandy at [phone] if she can not be reached. Patient will receive post hospital discharge calls and be evaluated for monthly home visits. [facility] Care Management services does not interfere with or replace any services arranged by the inpatient care management team. RNCM left contact information and [facility] literature at the bedside. Made inpatient RNCM aware that [facility] will be following for care management. For additional questions please contact:   Cleora Karnik RN, [facility] Liaison  [phone]) Business Mobile [phone]) Toll free office

## 2017-02-23 NOTE — Progress Notes (Signed)
Inpatient Diabetes Program Recommendations  AACE/ADA: New Consensus Statement on Inpatient Glycemic Control (2015)  Target Ranges:  Prepandial:   less than 140 mg/dL      Peak postprandial:   less than 180 mg/dL (1-2 hours)      Critically ill patients:  140 - 180 mg/dL  Results for DANNETTA, LEKAS (MRN [id number]) as of 02/23/2017 08:05  Ref. Range 02/22/2017 04:56 02/23/2017 05:22  Glucose Latest Ref Range: 65 - 99 mg/dL 339 (H) 301 (H)   Results for FLOIS, MCTAGUE (MRN [id number]) as of 02/23/2017 08:05  Ref. Range 02/22/2017 10:13 02/22/2017 11:59 02/22/2017 15:34 02/22/2017 16:56 02/22/2017 21:20  Glucose-Capillary Latest Ref Range: 65 - 99 mg/dL 464 (H) 416 (H) 268 (H) 221 (H) 128 (H)   Review of Glycemic Control  Diabetes history: DM2 Outpatient Diabetes medications: Glipizide 2.5 mg daily, Januvia 100 mg daily, Metformin 850 mg BID Current orders for Inpatient glycemic control: Lantus 15 units daily, Novolog 5 units TID with meals for meal coverage, Novolog 0-20 units TID with meals, Novolog 0-5 units QHS  Inpatient Diabetes Program Recommendations: Insulin - Basal: Please consider increasing Lantus to 20 units daily (starting today). Insulin - Meal Coverage: Please consider increasing meal coverage to Novolog 8 units TID with meals.    NOTE: Ordered Solumedrol 60 mg Q12H which is contributing to hyperglycemia. Recommend increasing Lantus and ordering Novolog meal coverage as noted above. Please keep in mind the as steroids are tapered, insulin may also need to be adjusted.  Thanks, [name], RN, MSN, CDE Diabetes Coordinator Inpatient Diabetes Program [phone] (Team Pager from 8am to 5pm)

## 2017-02-23 NOTE — Care Management Note (Signed)
Case Management Note  Patient Details  Name: Mary [name] MRN: [id number] Date of Birth: 12/07/1950  Expected Discharge Date:  02/22/17               Expected Discharge Plan:  [place]  In-House Referral:  NA  Discharge planning Services  CM Consult  Post Acute Care Choice:  Home Health, Durable Medical Equipment Choice offered to:  Patient  DME Arranged:  Oxygen DME Agency:  [place]:  PT [facility] Agency:  [place]  Status of Service:  In process, will continue to follow  If discussed at Long Length of Stay Meetings, dates discussed:  02/23/2017  Additional Comments: Not yet ready for DC. Has qualified for supplemental oxygen. Pt would like dme from [facility]. Romualdo Bolk, [facility] rep, aware of referral and will obtain pt info from chart. CM to cont to follow.   Sherald Barge, RN 02/23/2017, 1:39 PM

## 2017-02-23 NOTE — Progress Notes (Signed)
SATURATION QUALIFICATIONS: (This note is used to comply with regulatory documentation for home oxygen)  Patient Saturations on Room Air at Rest = 86%  Patient Saturations on Room Air while Ambulating = 84%  Patient Saturations on 2 Liters of oxygen while Ambulating = 88% on 2 liters. 96% on 3 liters.  Please briefly explain why patient needs home oxygen: Patient has COPD and is currently not able to maintain oxygen saturation while not on oxygen. Patient gets out of breath easily moving around in the bed with the oxygen on via nasal cannula. At home oxygen would benefit this patient greatly.

## 2017-02-23 NOTE — Progress Notes (Addendum)
PROGRESS NOTE    Mary [name]  QMV:[id number] DOB: April 08, 1951 DOA: 02/18/2017 PCP: Wende Neighbors, MD   Brief Narrative:  Mary [name] is a 66 y.o. female with medical history significant of COPD not apparently on home O2; chronic systolic HF (EF 28-41% in 2015), afib on Pradaxa; DM; and anxiety presenting to the hospital with worsening SOB.  Symptoms started last Friday.  She was hospitalized for SOB from 1/10-12 for apparent COPD exacerbation and/or mild CHF exacerbation (did not require BIPAP).  She was treated with IV Lasix, steroids, and azithromycin.She was unable to provide further history at the time of admission due to somnolence on BIPAP. Per report she had SOB associated with a cough for the past 6 days and had been taking tylenol and her inhaler without relief. Had reported she had a fever and CP but on my evaluation today she states it is from when she has been coughing so much. Still smokes occasional Cigarettes. Was admitted to SDU for COPD Exacerbation and placed on BiPAP and is now weaned off BiPAP and improving. Had Some RVR but Metoprolol was held because of Hypotension. RVR is improved. Patient was transferred to the medical floor on 02/21/17 and she is doing well however states she is unable to still expectorate like she wants. Hospitalization is complicated by uncontrolled hyperglycemia in the setting of IV Steroid Use.  Assessment & Plan:   Principal Problem:   Acute on chronic respiratory failure with hypoxia (HCC) Active Problems:   Diabetes mellitus (HCC)   Diastolic congestive heart failure (HCC)   Atrial fibrillation with rapid ventricular response (HCC)   COPD with acute exacerbation (HCC)   Hypomagnesemia   Malnutrition of moderate degree  Acute Hypercapnic Hypoxic Respiratory Failure requiring NIPPV with BiPAP likely 2/2 to Acute Exacerbation of COPD, improving and now on O2 via [place] -O2 Saturations in ED were in the 60'S on Admission -Weaned BiPAP and transitioned to O2  via [place]; Now Saturating well on 2 liters of [place] -Does not wear Home O2 -Continuous Pulse Oximetry and Maintain O2 Saturations > 92% -Checked Respiratory Virus Panel Negative; Removed Droplet Precautions -CT PE demonstrated no PE -Repeat CXR 02/20/17 showed The lungs are hyperinflated likely secondary to COPD. There is mild right basilar atelectasis. There is no pleural effusion or pneumothorax. The heart and mediastinum are normal. 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 There is thoracic aortic atherosclerosis -Walk Screen revealed patient severely desaturated and requires home O2 -PT recommends Home Health PT -Repeat Home O2 Walk Screen  -Acute Exacerbation of COPD -As above -Checked Respiratory Virus Panel and was Negative -Changed IV Methylprednisolone from 60 mg q6h to 60 mg IV q12h and will likely wean in AM -C/w IV Azithromycin  500 mg q24h -DuoNebs changed to Xopenex/Atrovent q6h because of RVR and Added Symibort Substitution of Dulera  -C/w Montelukast 10 mg po Daily -C/w Mucinex DM 1 tab po BID  -Dr. Luan Pulling of Pulmonary  Consulted and following and he added Mucomyst Nebs 3 mL q6h yesterday -Added Hypertonic Saline Nebs 4 mL Daily x 3 days -Will Try Chest Physiotherapy  -Given 1x Dose of Ketorolac 30 mg IV injection for Chest Wall pain from coughing  Afib with RVR -Patient with known and Chronic Atrial Fib- Was in RVR this AM but improved with morning meds so Cardizem gtt was held -C/w RemoteTelemetry -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 -Was intially started on Cardizem gtt and stopped; Continue to Monitor for need of  it -C/w Digoxin 125 mcg po Daily -C/w 100 mg of Metoprolol Daily and 75 mg of Metoprolol qhs  DM Type 2 with Uncontrolled Hyperglycemia in the setting of IV Steroid Use -Hold oral hypoglycemics -Repeat HbA1c was 6.9, the last was 8.6 in 4/16 -C/w Resistent Novolog SSI AC HS -Increased Lantus 15 units Daily to 20 units Daily -Increased 5 mg  of sq Novolog TIDwm to 8 units -Consulted Diabetic Education Coordinator and appreciate Recc's  -CBG's ranging from 74-415  Hypomagnesemia -Magnesium was 1.6 on Admission; Repeat Mag Level this AM was 1.9 -C/w Mag Oxide 200 mg po Daily -Repeat Mag Level in AM  Hx of Combined Systolic Heart Failure with EF of 95-28% and Diastolic Grade 2 Dysfunction; Now Chronic Diastolic Dysfunction with EF of 55-60% -ECHO 2015 shows EF of 30-35% and same year ECHO showes Grade 2 Diastolic Dysfunction -Currently Euvolemic and Compensated -C/w Metoprolol 100 mg Daily and Metoprolol 75 mg po qHs -C/w Lisinopril 2.5 mg po Daily  Hypertension -Currently well controlled -C/w 100 mg of Metoprolol Daily and 75 mg of Metoprolol qhs -C/w Lisinopril  2.5 mg po Daily  Hyperlipidemia -C/w Atorvastatin 40 mg po qHS  Anxiety  -C/w Alprazolam 1 mg po 4 times daily  Leukocytosis -WBC went from 14.6 -> 8.3 -> 15.9 -> 13.1 -> 15.0 -> 18.7 -Likely induced by Steroids -IVF now D/C'd  Moderate Protein Calorie Malnutrition -Dietician/Nutritionist consult appreciated -C/w Glucerna 237 mL po BID between meals  Nodular Lung Opacities -Patient admitted to having unintentional  -CT Scan showed There are areas of adenopathy, most pronounced in the precarinal and sub- carinal regions. There are nodular opacities in the lungs, largest measuring 1.3 x 1.1 x 0.9 cm in the superior segment right lower lobe. This combination of findings is concerning for neoplasm -Will need Outpatient PET-CT to further evaluate when out of the hospital   DVT prophylaxis: Anticoagulated with Dabigatran 150 mg po BID Code Status: FULL CODE Family Communication: No family present at bedside Disposition Plan: [place] PT when medically stable; Transfer to Medical Floor as patient has improved.  Consultants:   None   Procedures: None  Antimicrobials:  Anti-infectives    Start     Dose/Rate Route Frequency Ordered Stop   02/23/17  1200  azithromycin (ZITHROMAX) tablet 500 mg     500 mg Oral Daily 02/23/17 0809     02/18/17 1530  azithromycin (ZITHROMAX) 500 mg in dextrose 5 % 250 mL IVPB  Status:  Discontinued     500 mg 250 mL/hr over 60 Minutes Intravenous Every 24 hours 02/18/17 1526 02/23/17 0809     Subjective: Seen and examined and states she was still feeling weak and unable to expectorate. Feels her wheezing has improved. Also complained of chest wall pain. No nausea or vomiting. No other concerns or complaints at this time.   Objective: Vitals:   02/23/17 1355 02/23/17 2004 02/23/17 2005 02/23/17 2114  BP: 117/67   123/71  Pulse: (!) 101   (!) 103  Resp: 20   18  Temp: 98.1 F (36.7 C)   98.6 F (37 C)  TempSrc:    Oral  SpO2: 95% 94% 94% 92%  Weight:      Height:        Intake/Output Summary (Last 24 hours) at 02/23/17 2159 Last data filed at 02/23/17 1700  Gross per 24 hour  Intake              721 ml  Output  0 ml  Net              721 ml   Filed Weights   02/20/17 0400 02/21/17 0500 02/21/17 1512  Weight: 57.7 kg (127 lb 3.3 oz) 59.7 kg (131 lb 9.8 oz) 55.8 kg (123 lb)   Examination: Physical Exam:  Constitutional: NAD and appears calm and comfortable wearing O2 via [place] Eyes: Lids and conjunctivae normal, sclerae anicteric  ENMT: External Ears, Nose appear normal. Grossly normal hearing. Mucous membranes are moist.  Neck: Appears normal, supple, no cervical masses, normal ROM, no appreciable thyromegaly Respiratory: Clear to auscultation bilaterally with no appreciable wheezing or rhonchi this AM. No crackles or rales. Normal respiratory effort and no accessory muscle use. Patient still wearing O2 via [place] Cardiovascular: Irregularly Irregular and slightly tachycardic, Slight 2/6 Systolic murmur. No rubs / gallops. S1 and S2 auscultated. No extremity edema.  Chest Wall: Midline CABG incision noted Abdomen: Soft, non-tender, non-distended. No masses palpated. No appreciable  hepatosplenomegaly. Bowel sounds positive x4.  GU: Deferred. Musculoskeletal: No clubbing / cyanosis of digits/nails. No joint deformity upper and lower extremities.  Skin: No rashes, lesions, ulcers on limited skin evaluation. No induration; Warm and dry.  Neurologic: CN 2-12 grossly intact with no focal deficits. Romberg sign cerebellar reflexes not assessed.  Psychiatric: Normal judgment and insight. Alert and oriented x 3. Normal mood and appropriate affect.   Data Reviewed: I have personally reviewed following labs and imaging studies  CBC:  Recent Labs Lab 02/18/17 1106 02/19/17 0452 02/20/17 0508 02/21/17 0513 02/22/17 0456 02/23/17 0522  WBC 14.6* 8.3 15.9* 13.1* 15.0* 18.7*  NEUTROABS 11.3*  --  13.1* 12.3* 14.0* 17.4*  HGB 12.6 10.6* 9.9* 10.2* 10.3* 11.1*  HCT 40.4 32.6* 30.6* 31.6* 32.2* 33.8*  MCV 93.1 92.1 90.8 92.4 92.8 91.6  PLT 267 200 220 220 237 462   Basic Metabolic Panel:  Recent Labs Lab 02/19/17 0452 02/20/17 0508 02/21/17 0513 02/22/17 0456 02/23/17 0522  NA 136 136 135 134* 133*  K 4.4 4.4 4.9 4.5 4.4  CL 96* 97* 96* 93* 93*  CO2 32 33* 33* 33* 33*  GLUCOSE 136* 181* 312* 339* 301*  BUN 9 19 17  23* 26*  CREATININE 0.42* 0.54 0.46 0.53 0.52  CALCIUM 9.0 9.2 9.4 9.3 9.4  MG 1.2* 1.9 1.7 1.6* 1.9  PHOS  --  2.7 3.1 4.0 3.9   GFR: Estimated Creatinine Clearance: 60.1 mL/min (by C-G formula based on SCr of 0.52 mg/dL). Liver Function Tests:  Recent Labs Lab 02/18/17 1106 02/20/17 0508 02/21/17 0513 02/22/17 0456 02/23/17 0522  AST 28 17 15  13* 13*  ALT 19 14 16  13* 15  ALKPHOS 80 50 51 50 49  BILITOT 0.8 0.3 0.3 0.4 0.5  PROT 8.6* 5.9* 6.1* 6.1* 6.0*  ALBUMIN 4.6 3.1* 3.3* 3.3* 3.3*    Recent Labs Lab 02/18/17 1106  LIPASE 32   No results for input(s): AMMONIA in the last 168 hours. Coagulation Profile:  Recent Labs Lab 02/18/17 1106  INR 1.43   Cardiac Enzymes: No results for input(s): CKTOTAL, CKMB, CKMBINDEX,  TROPONINI in the last 168 hours. BNP (last 3 results) No results for input(s): PROBNP in the last 8760 hours. HbA1C: No results for input(s): HGBA1C in the last 72 hours. CBG:  Recent Labs Lab 02/22/17 1656 02/22/17 2120 02/23/17 0916 02/23/17 1120 02/23/17 1658  GLUCAP 221* 128* 415* 345* 74   Lipid Profile: No results for input(s): CHOL, HDL, LDLCALC, TRIG, CHOLHDL, LDLDIRECT in  the last 72 hours. Thyroid Function Tests: No results for input(s): TSH, T4TOTAL, FREET4, T3FREE, THYROIDAB in the last 72 hours. Anemia Panel: No results for input(s): VITAMINB12, FOLATE, FERRITIN, TIBC, IRON, RETICCTPCT in the last 72 hours. Sepsis Labs:  Recent Labs Lab 02/18/17 1109 02/18/17 2050 02/19/17 0452 02/20/17 0508  PROCALCITON  --  <0.10 <0.10 <0.10  LATICACIDVEN 1.83  --   --   --     Recent Results (from the past 240 hour(s))  MRSA PCR Screening     Status: None   Collection Time: 02/18/17  6:15 PM  Result Value Ref Range Status   MRSA by PCR NEGATIVE NEGATIVE Final    Comment:        The GeneXpert MRSA Assay (FDA approved for NASAL specimens only), is one component of a comprehensive MRSA colonization surveillance program. It is not intended to diagnose MRSA infection nor to guide or monitor treatment for MRSA infections.   Respiratory Panel by PCR     Status: None   Collection Time: 02/19/17  4:00 PM  Result Value Ref Range Status   Adenovirus NOT DETECTED NOT DETECTED Final   Coronavirus 229E NOT DETECTED NOT DETECTED Final   Coronavirus HKU1 NOT DETECTED NOT DETECTED Final   Coronavirus NL63 NOT DETECTED NOT DETECTED Final   Coronavirus OC43 NOT DETECTED NOT DETECTED Final   Metapneumovirus NOT DETECTED NOT DETECTED Final   Rhinovirus / Enterovirus NOT DETECTED NOT DETECTED Final   Influenza A NOT DETECTED NOT DETECTED Final   Influenza B NOT DETECTED NOT DETECTED Final   Parainfluenza Virus 1 NOT DETECTED NOT DETECTED Final   Parainfluenza Virus 2 NOT  DETECTED NOT DETECTED Final   Parainfluenza Virus 3 NOT DETECTED NOT DETECTED Final   Parainfluenza Virus 4 NOT DETECTED NOT DETECTED Final   Respiratory Syncytial Virus NOT DETECTED NOT DETECTED Final   Bordetella pertussis NOT DETECTED NOT DETECTED Final   Chlamydophila pneumoniae NOT DETECTED NOT DETECTED Final   Mycoplasma pneumoniae NOT DETECTED NOT DETECTED Final    Comment: Performed at [facility], [place]. [address], [place], [place] [address]    Radiology Studies: No results found. Scheduled Meds: . acetylcysteine  3 mL Nebulization Q6H  . ALPRAZolam  1 mg Oral QID  . aspirin EC  81 mg Oral Daily  . atorvastatin  40 mg Oral q1800  . azithromycin  500 mg Oral Daily  . dabigatran  150 mg Oral BID  . digoxin  125 mcg Oral Daily  . feeding supplement (GLUCERNA SHAKE)  237 mL Oral BID BM  . gabapentin  100 mg Oral TID  . guaiFENesin  1,200 mg Oral BID  . insulin aspart  0-20 Units Subcutaneous TID WC  . insulin aspart  0-5 Units Subcutaneous QHS  . insulin aspart  8 Units Subcutaneous Once  . insulin aspart  8 Units Subcutaneous TID WC  . insulin glargine  20 Units Subcutaneous Daily  . ipratropium  0.5 mg Nebulization Q6H  . levalbuterol  0.63 mg Nebulization Q6H  . lisinopril  2.5 mg Oral Daily  . magnesium oxide  200 mg Oral Daily  . mouth rinse  15 mL Mouth Rinse BID  . methylPREDNISolone (SOLU-MEDROL) injection  60 mg Intravenous Q12H  . metoprolol tartrate  100 mg Oral Daily   And  . metoprolol tartrate  75 mg Oral QHS  . mometasone-formoterol  2 puff Inhalation BID  . montelukast  10 mg Oral Daily  . sodium chloride HYPERTONIC  4 mL Nebulization Daily   Continuous Infusions:   LOS: 5 days   Kerney Elbe, DO [facility] Pager [phone]  If 7PM-7AM, please contact night-coverage [email or web address] Password [email or web address] 02/23/2017, 9:59 PM

## 2017-02-23 NOTE — Progress Notes (Signed)
PHARMACIST - PHYSICIAN COMMUNICATION DR:   Luan Pulling CONCERNING: Antibiotic IV to Oral Route Change Policy  RECOMMENDATION: This patient is receiving ZITHROMAX by the intravenous route.  Based on criteria approved by the Pharmacy and Therapeutics Committee, the antibiotic(s) is/are being converted to the equivalent oral dose form(s).  DESCRIPTION: These criteria include:  Patient being treated for a respiratory tract infection, urinary tract infection, cellulitis or clostridium difficile associated diarrhea if on metronidazole  The patient is not neutropenic and does not exhibit a GI malabsorption state  The patient is eating (either orally or via tube) and/or has been taking other orally administered medications for a least 24 hours  The patient is improving clinically and has a Tmax < 100.5  If you have questions about this conversion, please contact the Pharmacy Department  [x]   [phone] )  [facility] []   [phone] )  [facility] []   [phone] )  [facility] []   [phone] )  [facility] []   [phone] )  [place], PharmD Clinical Pharmacist Pager:  [phone] 02/23/2017

## 2017-02-24 ENCOUNTER — Telehealth: Payer: Self-pay | Admitting: *Deleted

## 2017-02-24 DIAGNOSIS — E440 Moderate protein-calorie malnutrition: Secondary | ICD-10-CM

## 2017-02-24 DIAGNOSIS — E1141 Type 2 diabetes mellitus with diabetic mononeuropathy: Secondary | ICD-10-CM

## 2017-02-24 DIAGNOSIS — I4891 Unspecified atrial fibrillation: Secondary | ICD-10-CM

## 2017-02-24 DIAGNOSIS — J449 Chronic obstructive pulmonary disease, unspecified: Secondary | ICD-10-CM

## 2017-02-24 DIAGNOSIS — R0902 Hypoxemia: Secondary | ICD-10-CM

## 2017-02-24 DIAGNOSIS — I5032 Chronic diastolic (congestive) heart failure: Secondary | ICD-10-CM

## 2017-02-24 LAB — CBC WITH DIFFERENTIAL/PLATELET
BASOS ABS: 0 10*3/uL (ref 0.0–0.1)
Basophils Relative: 0 %
EOS PCT: 0 %
Eosinophils Absolute: 0 10*3/uL (ref 0.0–0.7)
HCT: 32.5 % — ABNORMAL LOW (ref 36.0–46.0)
Hemoglobin: 10.5 g/dL — ABNORMAL LOW (ref 12.0–15.0)
LYMPHS PCT: 5 %
Lymphs Abs: 0.8 10*3/uL (ref 0.7–4.0)
MCH: 29.3 pg (ref 26.0–34.0)
MCHC: 32.3 g/dL (ref 30.0–36.0)
MCV: 90.8 fL (ref 78.0–100.0)
MONO ABS: 0.4 10*3/uL (ref 0.1–1.0)
Monocytes Relative: 3 %
Neutro Abs: 15.6 10*3/uL — ABNORMAL HIGH (ref 1.7–7.7)
Neutrophils Relative %: 92 %
PLATELETS: 228 10*3/uL (ref 150–400)
RBC: 3.58 MIL/uL — ABNORMAL LOW (ref 3.87–5.11)
RDW: 15.1 % (ref 11.5–15.5)
WBC: 16.8 10*3/uL — ABNORMAL HIGH (ref 4.0–10.5)

## 2017-02-24 LAB — MAGNESIUM: MAGNESIUM: 1.8 mg/dL (ref 1.7–2.4)

## 2017-02-24 LAB — COMPREHENSIVE METABOLIC PANEL
ALT: 18 U/L (ref 14–54)
AST: 15 U/L (ref 15–41)
Albumin: 2.8 g/dL — ABNORMAL LOW (ref 3.5–5.0)
Alkaline Phosphatase: 42 U/L (ref 38–126)
Anion gap: 6 (ref 5–15)
BUN: 30 mg/dL — ABNORMAL HIGH (ref 6–20)
CHLORIDE: 95 mmol/L — AB (ref 101–111)
CO2: 33 mmol/L — ABNORMAL HIGH (ref 22–32)
Calcium: 9 mg/dL (ref 8.9–10.3)
Creatinine, Ser: 0.54 mg/dL (ref 0.44–1.00)
Glucose, Bld: 258 mg/dL — ABNORMAL HIGH (ref 65–99)
POTASSIUM: 4.5 mmol/L (ref 3.5–5.1)
Sodium: 134 mmol/L — ABNORMAL LOW (ref 135–145)
Total Bilirubin: 0.5 mg/dL (ref 0.3–1.2)
Total Protein: 5.4 g/dL — ABNORMAL LOW (ref 6.5–8.1)

## 2017-02-24 LAB — GLUCOSE, CAPILLARY
Glucose-Capillary: 272 mg/dL — ABNORMAL HIGH (ref 65–99)
Glucose-Capillary: 341 mg/dL — ABNORMAL HIGH (ref 65–99)
Glucose-Capillary: 268 mg/dL — ABNORMAL HIGH (ref 65–99)

## 2017-02-24 LAB — PHOSPHORUS: PHOSPHORUS: 4.3 mg/dL (ref 2.5–4.6)

## 2017-02-24 MED ORDER — AZITHROMYCIN 250 MG PO TABS: 500.0000 mg | ORAL_TABLET | Freq: Once | ORAL | 0 refills | Status: AC

## 2017-02-24 MED ORDER — ACETYLCYSTEINE 20 % IN SOLN: 3.0000 mL | Freq: Four times a day (QID) | RESPIRATORY_TRACT | 0 refills | Status: DC

## 2017-02-24 MED ORDER — PREDNISONE 20 MG PO TABS
20.0000 mg | ORAL_TABLET | Freq: Every day | ORAL | 0 refills | Status: AC
Start: 2017-02-24 — End: 2017-02-27

## 2017-02-24 MED ORDER — LEVALBUTEROL HCL 0.63 MG/3ML IN NEBU: 0.6300 mg | INHALATION_SOLUTION | Freq: Four times a day (QID) | RESPIRATORY_TRACT | 0 refills | Status: DC

## 2017-02-24 NOTE — Care Management Note (Addendum)
Case Management Note  Patient Details  Name: Mary [name] MRN: [id number] Date of Birth: [birth year]  Expected Discharge Date:  02/22/17               Expected Discharge Plan:  [place]  In-House Referral:  NA  Discharge planning Services  CM Consult  Post Acute Care Choice:  Home Health, Durable Medical Equipment Choice offered to:  Patient  DME Arranged:  Oxygen DME Agency:  [place]:  PT [facility] Agency:  [place]  Status of Service:  Completed, signed off  Additional Comments: Pt discharging home today with HH PT/aid and oxygen. Romualdo Bolk, [facility] rep, will deliver DME to pt room today prior to DC. Pt aware HH has 48hrs to make first visit. MD has also ordered [facility] nursing. Pt would like to refuse nursing at this time so that she may receive HH through [facility]. Per pt, she has been enrolled in [facility] care management and may access a nurse through them if needed.  Pt will need oxygen because alternate treatments (xopennex) tried and found insufficient.   Sherald Barge, RN 02/24/2017, 3:18 PM

## 2017-02-24 NOTE — Evaluation (Signed)
Occupational Therapy Evaluation Patient Details Name: Mary [name] MRN: [id number] DOB: 1950-12-23 Today's Date: 02/24/2017    History of Present Illness 66 y.o. female with medical history significant of COPD not apparently on home O2; chronic systolic HF (EF 40-98% in 2015), afib on Pradaxa; DM; and anxiety presenting to the hospital with worsening SOB.  She has acute on chronic hypoxic respiratory failure. She has COPD exacerbation.  She had CT of the chest which I have personally reviewed for pulmonary  embolism and it did not show pulmonary embolism but did show some mediastinal adenopathy. She also had areas in the superior segment of the right upper lobe all of this is concerning that she may have a primary lung cancer.   Clinical Impression   Patient in bed upon therapy arrival and agreeable to participate in OT evaluation. Patient doing better and is at baseline for her basic daily tasks which is Mod I. Patient was educated on energy conservation techniques and given a handout to reference. Discussed specific scenarios at home of ways to conserve energy. Patient verbalized understanding. No further OT needs at this time.    Follow Up Recommendations  No OT follow up    Equipment Recommendations  None recommended by OT       Precautions / Restrictions Precautions Precautions: Fall Precaution Comments: 1 fall - due to orthostatic hypotension after starting a new BP medication.  Restrictions Weight Bearing Restrictions: No             ADL either performed or assessed with clinical judgement   ADL Overall ADL's : Modified independent;At baseline                        Pertinent Vitals/Pain Pain Assessment: 0-10     Hand Dominance Right   Extremity/Trunk Assessment Upper Extremity Assessment Upper Extremity Assessment: Overall WFL for tasks assessed           Communication Communication Communication: No difficulties   Cognition Arousal/Alertness:  Awake/alert Behavior During Therapy: WFL for tasks assessed/performed Overall Cognitive Status: Within Functional Limits for tasks assessed                    Home Living Family/patient expects to be discharged to:: Private residence Living Arrangements: Alone Available Help at Discharge: Family Type of Home: Apartment Home Access: Stairs to enter Technical brewer of Steps: 3+1   Home Layout: One level     Bathroom Shower/Tub: Teacher, early years/pre: Handicapped height     Home Equipment: Environmental consultant - 2 wheels;Wheelchair - manual;Grab bars - tub/shower;Cane - quad;Cane - single point;Bedside commode;Shower seat          Prior Functioning/Environment Level of Independence: Independent with assistive device(s)  Gait / Transfers Assistance Needed: Pt uses cane for ambulation in the house most of the time.  Still drives a little bit when necessary, otherwise granddaughter helps her run errands.  Pt still uses cane in the community, but will use the RW or the w/c if needed.   ADL's / Homemaking Assistance Needed: independent.          AM-PAC PT "6 Clicks" Daily Activity     Outcome Measure Help from another person eating meals?: None Help from another person taking care of personal grooming?: None Help from another person toileting, which includes using toliet, bedpan, or urinal?: None Help from another person bathing (including washing, rinsing, drying)?: None Help from another person to put on  and taking off regular upper body clothing?: None Help from another person to put on and taking off regular lower body clothing?: None 6 Click Score: 24   End of Session    Activity Tolerance: Patient tolerated treatment well Patient left: in bed;with call bell/phone within reach;with nursing/sitter in room  OT Visit Diagnosis: Muscle weakness (generalized) (M62.81)                Time: 2423-5361 OT Time Calculation (min): 8 min Charges:  OT General  Charges $OT Visit: 1 Procedure OT Evaluation $OT Eval Low Complexity: 1 Procedure G-Codes: OT G-codes **NOT FOR INPATIENT CLASS** Functional Assessment Tool Used: AM-PAC 6 Clicks Daily Activity Functional Limitation: Self care Self Care Current Status (W4315): 0 percent impaired, limited or restricted Self Care Goal Status (Q0086): 0 percent impaired, limited or restricted Self Care Discharge Status (P6195): 0 percent impaired, limited or restricted   Ailene Ravel, OTR/L,CBIS  [phone]   Jocelyne Reinertsen, Clarene Duke 02/24/2017, 9:36 AM

## 2017-02-24 NOTE — Progress Notes (Addendum)
Inpatient Diabetes Program Recommendations  AACE/ADA: New Consensus Statement on Inpatient Glycemic Control (2015)  Target Ranges:  Prepandial:   less than 140 mg/dL      Peak postprandial:   less than 180 mg/dL (1-2 hours)      Critically ill patients:  140 - 180 mg/dL   Results for SHAKEVIA, SARRIS (MRN [id number]) as of 02/24/2017 07:59  Ref. Range 02/23/2017 09:16 02/23/2017 11:20 02/23/2017 16:58 02/23/2017 21:17 02/24/2017 07:26  Glucose-Capillary Latest Ref Range: 65 - 99 mg/dL 415 (H)  Novolog 28 units @ 9:47  Lantus 20 units @ 10:46 345 (H)  Novolog 23 units @ 11:56 74 300 (H)  Novolog 3 units 272 (H)   Review of Glycemic Control  Diabetes history:DM2 Outpatient Diabetes medications: Glipizide 2.5 mg daily, Januvia 100 mg daily, Metformin 850 mg BID Current orders for Inpatient glycemic control: Lantus 20 units daily, Novolog 8 units TID with meals for meal coverage, Novolog 0-20 units TID with meals, Novolog 0-5 units QHS  Inpatient Diabetes Program Recommendations: Insulin - Basal: If steroids are continued, please consider increasing Lantus to 23 units daily. Insulin - Meal Coverage: If steroids are continued as ordered, please consider increasing meal coverage to Novolog 10 units TID with meals.  Note: In reviewing chart and glucose trends for 02/23/17, noted patient received 8 am Novolog correction and meal coverage late at 9:47 and then got 12 pm Novolog correction and meal coverage at 11:56 (a little over 2 hours after morning Novolog). As a result, glucose down to 74 mg/dl at 16:56 and patient did NOT receive any meal coverage for supper. Then bedtime glucose back up to 300 mg/dl at 21:17. NURSING: Please administer insulin at scheduled times as ordered.  Thanks, [name], RN, MSN, CDE Diabetes Coordinator Inpatient Diabetes Program [phone] (Team Pager from 8am to 5pm)

## 2017-02-24 NOTE — Care Management Important Message (Signed)
Important Message  Patient Details  Name: LISANDRA MATHISEN MRN: [id number] Date of Birth: 1950-11-08   Medicare Important Message Given:  Yes    Sherald Barge, RN 02/24/2017, 3:10 PM

## 2017-02-24 NOTE — Progress Notes (Signed)
Patient is to be discharged home with Home Health in stable condition. Patient has been educated on follow-up appointments and medication regimen. Patient verbalizes understanding. Patient awaiting transportation and will be escorted out by staff.   Celestia Khat, RN

## 2017-02-24 NOTE — Discharge Summary (Addendum)
Physician Discharge Summary  Mary [name] YQM:[id number] DOB: August 05, 1951 DOA: 02/18/2017  PCP: Wende Neighbors, MD  Admit date: 02/18/2017 Discharge date: 02/24/2017  Admitted From: Home Disposition:  Home  Recommendations for Outpatient Follow-up:  1. Follow up with PCP in 1-2 weeks 2. Discuss referral for PET scan outpatient with your physician 3. Please obtain BMP/CBC in one week 4. Use oxygen as directed 5. Schedule follow up with Dr. Luan Pulling 6. [facility] to contact you 7. Home Health services set up for you   Home Health: Yes Equipment/Devices: 3L [place] oxygen  Discharge Condition: Stable  CODE STATUS:Full Code  Diet recommendation: Heart Healthy / Carb Modified  Brief/Interim Summary: Mary [name] a 66 y.o.femalewith medical history significant of COPD not apparently on home O2; chronic systolic HF (EF 52-84% in 2015), afib on Pradaxa; DM; and anxiety presenting to the hospital with worsening SOB. Symptoms started last Friday. She was hospitalized for SOB from 1/10-12 for apparent COPD exacerbation and/or mild CHF exacerbation (did not require BIPAP). She was treated with IV Lasix, steroids, and azithromycin.She was unable to provide further history at the time of admission due to somnolence on BIPAP. Per report she had SOB associated with a cough for the past 6 days and had been taking tylenol and her inhaler without relief. Had reported she had a fever and CP but on my evaluation today she states it is from when she has been coughing so much. Still smokes occasional Cigarettes. Was admitted to SDU for COPD Exacerbation and placed on BiPAP and is now weaned off BiPAP and improving. Had Some RVR but Metoprolol was held because of Hypotension. RVR is improved. Patient was transferred to the medical floor on 02/21/17 and she is doing well however states she is unable to still expectorate like she wants. Hospitalization is complicated by uncontrolled hyperglycemia in the setting of IV Steroid  Use.  Discharge Diagnoses:  Principal Problem:   Acute on chronic respiratory failure with hypoxia (HCC) Active Problems:   Diabetes mellitus (HCC)   Diastolic congestive heart failure (HCC)   Atrial fibrillation with rapid ventricular response (HCC)   COPD with acute exacerbation (HCC)   Hypomagnesemia   Malnutrition of moderate degree    Discharge Instructions  Discharge Instructions    AMB Referral to [facility]    Complete by:  As directed    Reason for consult:  Post hospital follow up with a RN Community Case Manager   Diagnoses of:  COPD/ Pneumonia   Expected date of contact:  1-3 days (reserved for hospital discharges)   Please assign patient for community nurse to engage for transition of care calls and evaluate for monthly home visits. For questions please contact:   [name] Minor RN, [facility] Liaison [phone])   Call MD for:  difficulty breathing, headache or visual disturbances    Complete by:  As directed    Call MD for:  extreme fatigue    Complete by:  As directed    Call MD for:  hives    Complete by:  As directed    Call MD for:  persistant dizziness or light-headedness    Complete by:  As directed    Call MD for:  persistant nausea and vomiting    Complete by:  As directed    Call MD for:  severe uncontrolled pain    Complete by:  As directed    Call MD for:  temperature >100.4    Complete by:  As directed    Diet - low sodium heart healthy    Complete by:  As directed    Diet Carb Modified    Complete by:  As directed    Increase activity slowly    Complete by:  As directed      Allergies as of 02/24/2017      Reactions   Adhesive [tape] Other (See Comments)   Tears skin off.    Latex Other (See Comments)   In tape, tears skin off.   Zocor [simvastatin - High Dose] Nausea And Vomiting      Medication List    STOP taking these medications   albuterol 108 (90 Base) MCG/ACT inhaler Commonly known as:  PROAIR HFA      TAKE these medications   ACCU-CHEK AVIVA PLUS test strip Generic drug:  glucose blood USE TO TEST 3 TO 4 TIMES DAILY OR AS DIRECTED.   acetaminophen 500 MG tablet Commonly known as:  TYLENOL Take 500 mg by mouth every 6 (six) hours as needed for mild pain.   acetylcysteine 20 % nebulizer solution Commonly known as:  MUCOMYST Take 3 mLs by nebulization every 6 (six) hours.   ALPRAZolam 1 MG tablet Commonly known as:  XANAX Take 1 mg by mouth 4 (four) times daily.   aspirin EC 81 MG tablet Take 81 mg by mouth daily.   atorvastatin 40 MG tablet Commonly known as:  LIPITOR Take 1 tablet (40 mg total) by mouth daily. KEEP OV.   azithromycin 250 MG tablet Commonly known as:  ZITHROMAX Take 2 tablets (500 mg total) by mouth once. Start taking on:  02/25/2017   DIGOX 0.125 MG tablet Generic drug:  digoxin TAKE ONE TABLET BY MOUTH ONCE DAILY.   fluticasone 50 MCG/ACT nasal spray Commonly known as:  FLONASE Place 2 sprays into both nostrils daily. What changed:  when to take this  reasons to take this   furosemide 40 MG tablet Commonly known as:  LASIX TAKE ONE TABLET BY MOUTH ONCE DAILY.   gabapentin 100 MG capsule Commonly known as:  NEURONTIN Take 1 capsule by mouth 3 (three) times daily.   glipiZIDE 2.5 MG 24 hr tablet Commonly known as:  GLUCOTROL XL 2.5 mg daily.   JANUVIA 100 MG tablet Generic drug:  sitaGLIPtin TAKE ONE TABLET BY MOUTH DAILY.   levalbuterol 0.63 MG/3ML nebulizer solution Commonly known as:  XOPENEX Take 3 mLs (0.63 mg total) by nebulization every 6 (six) hours.   lisinopril 2.5 MG tablet Commonly known as:  PRINIVIL,ZESTRIL Take 1 tablet (2.5 mg total) by mouth daily.   Magnesium Oxide 250 MG Tabs Take 250 mg by mouth daily.   metFORMIN 850 MG tablet Commonly known as:  GLUCOPHAGE TAKE 1 TABLET BY MOUTH TWICE DAILY WITH MEALS.   metoprolol 50 MG tablet Commonly known as:  LOPRESSOR Take 2 tablets in the morning and 1 1/2  tablets at night   montelukast 10 MG tablet Commonly known as:  SINGULAIR Take 1 tablet by mouth daily.   multivitamin capsule Take 1 capsule by mouth daily.   potassium chloride SA 20 MEQ tablet Commonly known as:  K-DUR,KLOR-CON Take 1 tablet (20 mEq total) by mouth daily.   PRADAXA 150 MG Caps capsule Generic drug:  dabigatran TAKE ONE CAPSULE BY MOUTH TWICE DAILY.   predniSONE 20 MG tablet Commonly known as:  DELTASONE Take 1 tablet (20 mg total) by mouth daily with breakfast.   sodium chloride 0.65 % nasal spray  Commonly known as:  OCEAN Place 1 spray into the nose as needed for congestion. Congestion   SYMBICORT 160-4.5 MCG/ACT inhaler Generic drug:  budesonide-formoterol INHALE 2 PUFFS INTO THE LUNGS TWICE DAILY. RINSE MOUTH AFTER USE.            Durable Medical Equipment        Start     Ordered   02/24/17 1521  DME Oxygen  Once    Question Answer Comment  Mode or (Route) Nasal cannula   Liters per Minute 3   Frequency Continuous (stationary and portable oxygen unit needed)   Oxygen conserving device Yes   Oxygen delivery system Gas      02/24/17 1522   02/24/17 1150  For home use only DME oxygen  Once    Question Answer Comment  Mode or (Route) Nasal cannula   Liters per Minute 3   Frequency Continuous (stationary and portable oxygen unit needed)   Oxygen conserving device Yes   Oxygen delivery system Gas      02/24/17 1150     Follow-up Information    [facility].   Contact information: [place] [address] [phone]        Wende Neighbors, MD. Schedule an appointment as soon as possible for a visit in 1 week(s).   Specialty:  Internal Medicine Contact information: [place] [address] [phone]        HAWKINS,EDWARD L, MD. Schedule an appointment as soon as possible for a visit in 2 week(s).   Specialty:  Pulmonary Disease Contact information: [address] [place] [address] [phone]          Allergies  Allergen Reactions  . Adhesive [Tape] Other (See Comments)    Tears skin off.   . Latex Other (See Comments)    In tape, tears skin off.  . Zocor [Simvastatin - High Dose] Nausea And Vomiting    Consultations:  [facility]  CM  Diabetes Educator   Procedures/Studies: Dg Chest 1 View  Result Date: 02/20/2017 CLINICAL DATA:  COPD EXAM: CHEST 1 VIEW COMPARISON:  02/18/2017 CT chest FINDINGS: The lungs are hyperinflated likely secondary to COPD. There is mild right basilar atelectasis. There is no pleural effusion or pneumothorax. The heart and mediastinum are normal. 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 There is thoracic aortic atherosclerosis. The osseous structures are unremarkable. IMPRESSION: No active disease. Electronically Signed   By: Kathreen Devoid   On: 02/20/2017 09:33   Ct Angio Chest Pe W And/or Wo Contrast  Result Date: 02/18/2017 CLINICAL DATA:  Chest pain and shortness of breath EXAM: CT ANGIOGRAPHY CHEST WITH CONTRAST TECHNIQUE: Multidetector CT imaging of the chest was performed using the standard protocol during bolus administration of intravenous contrast. Multiplanar CT image reconstructions and MIPs were obtained to evaluate the vascular anatomy. CONTRAST:  100 mL Isovue 370 nonionic COMPARISON:  Chest radiograph February 18, 2017 FINDINGS: Cardiovascular: There is no demonstrable pulmonary embolus. There is no thoracic aortic aneurysm or dissection. There is moderate atherosclerotic calcification in the proximal visualized great vessels. There is extensive calcification in the aorta. There are multiple foci of native coronary artery calcification. The patient is status post coronary artery bypass grafting. Pericardium is not appreciably thickened. Mediastinum/Nodes: Thyroid appears normal. There are multiple subcentimeter mediastinal lymph nodes. There is an enlarged lymph node anterior to the carina measuring  2.3 x 1.8 cm. There is sub- carinal adenopathy measuring  3.2 x 2.2 cm. There is an enlarged lymph node in the right hilum measuring 1.7 x 1.1 cm. There is a small hiatal hernia. Lungs/Pleura: On axial slice 52 series 8, there is a 5 mm nodular opacity in the anterior segment of the left upper lobe. There is a somewhat irregular masslike area in the superior segment right lower lobe measuring 1.3 x 1.1 x 0.9 cm, best seen on axial slice 57 series 8 and coronal slice 948 series 10. There is a nodular opacity in the posterior segment of the right upper lobe on axial slice 59 series 8 measuring 4 mm. There is a nodular lesion in the superior segment right lower lobe seen on axial slice 64 series 8 measuring 8 x 5 mm. There is a nodular lesion also in the superior segment of the right lower lobe on axial slice 63 series 8 measuring 0.9 x 0.8 cm. There is a 3 mm nodular opacity in the lateral segment of the left lower lobe on axial slice 63 series 8. There is a 5 mm nodular opacity in the posterior aspect of the superior segment of the left lower lobe seen on axial slice 67 series 8. There are multiple scattered 1-2 mm nodular opacities in the upper lobes bilaterally. There is atelectatic change in the anterior lung bases bilaterally. There is no frank airspace consolidation. No pleural effusion or pleural thickening. Upper Abdomen: There is reflux of contrast into the inferior vena cava and hepatic veins. Visualized upper abdominal structures otherwise appear unremarkable. Musculoskeletal: Patient is status post median sternotomy. There are no blastic or lytic bone lesions. Review of the MIP images confirms the above findings. IMPRESSION: No demonstrable pulmonary embolus. Reflux of contrast into the inferior vena cava and hepatic veins may well represent a degree of increased right heart pressure. There are areas of adenopathy, most pronounced in the precarinal and sub- carinal regions. There are nodular opacities in  the lungs, largest measuring 1.3 x 1.1 x 0.9 cm in the superior segment right lower lobe. This combination of findings is concerning for neoplasm. Advise PET-CT to further evaluate. No frank edema or consolidation. Extensive atherosclerotic calcification. Patient is status post coronary artery bypass grafting. Electronically Signed   By: Lowella Grip III M.D.   On: 02/18/2017 15:19   Dg Chest Portable 1 View  Result Date: 02/18/2017 CLINICAL DATA:  66 year old presenting with a 6 day history of productive cough, chest congestion and shortness of breath. Current history of COPD. Prior CABG. Hypoxia noted while in the emergency department. EXAM: PORTABLE CHEST 1 VIEW COMPARISON:  11/04/2016, 09/17/2016 and earlier. FINDINGS: Sternotomy for CABG. Cardiac silhouette upper normal in size for AP portable technique, unchanged. Left atrial appendage clip. Mildly prominent bronchovascular markings diffusely, unchanged. Lungs otherwise clear. No localized airspace consolidation. No pleural effusions. No pneumothorax. Normal pulmonary vascularity. IMPRESSION: No acute cardiopulmonary disease. Electronically Signed   By: Evangeline Dakin M.D.   On: 02/18/2017 11:35      Subjective: Patient seen and she reports that she feels tired and short of breath but that is her baseline.  She reports she has felt like this since she had her bypass 3 years ago.  Wants to follow up outpatient with Dr. Luan Pulling.  Discharge Exam: Vitals:   02/24/17 0501 02/24/17 1300  BP: 122/68 132/73  Pulse: 90 98  Resp: 18 20  Temp: 98 F (36.7 C) 97.5 F (36.4 C)   Vitals:   02/24/17 0842 02/24/17 0856 02/24/17 1300 02/24/17 1520  BP:   132/73   Pulse:   98   Resp:   20   Temp:   97.5 F (36.4 C)   TempSrc:   Oral   SpO2: 94% 100% 96% 99%  Weight:      Height:        General: Pt is alert, awake, not in acute distress Cardiovascular: RRR, F6/E3 +, II/VI systolic murmur, no rubs, no gallops Respiratory: poor air  movement, no accessory muscle use, end expiratory wheezing in upper lung fields bilaterally Abdominal: Soft, NT, ND, bowel sounds + Extremities: no edema, no cyanosis    The results of significant diagnostics from this hospitalization (including imaging, microbiology, ancillary and laboratory) are listed below for reference.     Microbiology: Recent Results (from the past 240 hour(s))  MRSA PCR Screening     Status: None   Collection Time: 02/18/17  6:15 PM  Result Value Ref Range Status   MRSA by PCR NEGATIVE NEGATIVE Final    Comment:        The GeneXpert MRSA Assay (FDA approved for NASAL specimens only), is one component of a comprehensive MRSA colonization surveillance program. It is not intended to diagnose MRSA infection nor to guide or monitor treatment for MRSA infections.   Respiratory Panel by PCR     Status: None   Collection Time: 02/19/17  4:00 PM  Result Value Ref Range Status   Adenovirus NOT DETECTED NOT DETECTED Final   Coronavirus 229E NOT DETECTED NOT DETECTED Final   Coronavirus HKU1 NOT DETECTED NOT DETECTED Final   Coronavirus NL63 NOT DETECTED NOT DETECTED Final   Coronavirus OC43 NOT DETECTED NOT DETECTED Final   Metapneumovirus NOT DETECTED NOT DETECTED Final   Rhinovirus / Enterovirus NOT DETECTED NOT DETECTED Final   Influenza A NOT DETECTED NOT DETECTED Final   Influenza B NOT DETECTED NOT DETECTED Final   Parainfluenza Virus 1 NOT DETECTED NOT DETECTED Final   Parainfluenza Virus 2 NOT DETECTED NOT DETECTED Final   Parainfluenza Virus 3 NOT DETECTED NOT DETECTED Final   Parainfluenza Virus 4 NOT DETECTED NOT DETECTED Final   Respiratory Syncytial Virus NOT DETECTED NOT DETECTED Final   Bordetella pertussis NOT DETECTED NOT DETECTED Final   Chlamydophila pneumoniae NOT DETECTED NOT DETECTED Final   Mycoplasma pneumoniae NOT DETECTED NOT DETECTED Final    Comment: Performed at [facility], [place]. [address], [place], [place] [address]      Labs: BNP (last 3 results)  Recent Labs  09/17/16 1153 11/04/16 1712 02/18/17 1106  BNP 514.0* 496.0* 884.1*   Basic Metabolic Panel:  Recent Labs Lab 02/20/17 0508 02/21/17 0513 02/22/17 0456 02/23/17 0522 02/24/17 0527  NA 136 135 134* 133* 134*  K 4.4 4.9 4.5 4.4 4.5  CL 97* 96* 93* 93* 95*  CO2 33* 33* 33* 33* 33*  GLUCOSE 181* 312* 339* 301* 258*  BUN 19 17 23* 26* 30*  CREATININE 0.54 0.46 0.53 0.52 0.54  CALCIUM 9.2 9.4 9.3 9.4 9.0  MG 1.9 1.7 1.6* 1.9 1.8  PHOS 2.7 3.1 4.0 3.9 4.3   Liver Function Tests:  Recent Labs Lab 02/20/17 0508 02/21/17 0513 02/22/17 0456 02/23/17 0522 02/24/17 0527  AST 17 15 13* 13* 15  ALT 14 16 13* 15 18  ALKPHOS 50 51 50 49 42  BILITOT 0.3 0.3 0.4 0.5 0.5  PROT 5.9* 6.1* 6.1* 6.0* 5.4*  ALBUMIN 3.1* 3.3* 3.3* 3.3* 2.8*    Recent Labs Lab 02/18/17 1106  LIPASE 32   No results for input(s): AMMONIA in the last 168 hours. CBC:  Recent Labs Lab 02/20/17 0508 02/21/17 0513 02/22/17 0456 02/23/17 0522 02/24/17 0527  WBC 15.9* 13.1* 15.0* 18.7* 16.8*  NEUTROABS 13.1* 12.3* 14.0* 17.4* 15.6*  HGB 9.9* 10.2* 10.3* 11.1* 10.5*  HCT 30.6* 31.6* 32.2* 33.8* 32.5*  MCV 90.8 92.4 92.8 91.6 90.8  PLT 220 220 237 245 228   Cardiac Enzymes: No results for input(s): CKTOTAL, CKMB, CKMBINDEX, TROPONINI in the last 168 hours. BNP: Invalid input(s): POCBNP CBG:  Recent Labs Lab 02/23/17 1120 02/23/17 1658 02/23/17 2117 02/24/17 0726 02/24/17 1113  GLUCAP 345* 74 300* 272* 341*   D-Dimer No results for input(s): DDIMER in the last 72 hours. Hgb A1c No results for input(s): HGBA1C in the last 72 hours. Lipid Profile No results for input(s): CHOL, HDL, LDLCALC, TRIG, CHOLHDL, LDLDIRECT in the last 72 hours. Thyroid function studies No results for input(s): TSH, T4TOTAL, T3FREE, THYROIDAB in the last 72 hours.  Invalid input(s): FREET3 Anemia work up No results for input(s): VITAMINB12, FOLATE, FERRITIN,  TIBC, IRON, RETICCTPCT in the last 72 hours. Urinalysis    Component Value Date/Time   COLORURINE YELLOW 08/14/2016 [place] 08/14/2016 1343   LABSPEC 1.010 08/14/2016 1343   PHURINE 7.0 08/14/2016 1343   GLUCOSEU NEGATIVE 08/14/2016 1343   HGBUR NEGATIVE 08/14/2016 1343   BILIRUBINUR NEGATIVE 08/14/2016 1343   KETONESUR NEGATIVE 08/14/2016 1343   PROTEINUR NEGATIVE 08/14/2016 1343   UROBILINOGEN 0.2 02/17/2015 2040   NITRITE NEGATIVE 08/14/2016 1343   LEUKOCYTESUR NEGATIVE 08/14/2016 1343   Sepsis Labs Invalid input(s): PROCALCITONIN,  WBC,  LACTICIDVEN Microbiology Recent Results (from the past 240 hour(s))  MRSA PCR Screening     Status: None   Collection Time: 02/18/17  6:15 PM  Result Value Ref Range Status   MRSA by PCR NEGATIVE NEGATIVE Final    Comment:        The GeneXpert MRSA Assay (FDA approved for NASAL specimens only), is one component of a comprehensive MRSA colonization surveillance program. It is not intended to diagnose MRSA infection nor to guide or monitor treatment for MRSA infections.   Respiratory Panel by PCR     Status: None   Collection Time: 02/19/17  4:00 PM  Result Value Ref Range Status   Adenovirus NOT DETECTED NOT DETECTED Final   Coronavirus 229E NOT DETECTED NOT DETECTED Final   Coronavirus HKU1 NOT DETECTED NOT DETECTED Final   Coronavirus NL63 NOT DETECTED NOT DETECTED Final   Coronavirus OC43 NOT DETECTED NOT DETECTED Final   Metapneumovirus NOT DETECTED NOT DETECTED Final   Rhinovirus / Enterovirus NOT DETECTED NOT DETECTED Final   Influenza A NOT DETECTED NOT DETECTED Final   Influenza B NOT DETECTED NOT DETECTED Final   Parainfluenza Virus 1 NOT DETECTED NOT DETECTED Final   Parainfluenza Virus 2 NOT DETECTED NOT DETECTED Final   Parainfluenza Virus 3 NOT DETECTED NOT DETECTED Final   Parainfluenza Virus 4 NOT DETECTED NOT DETECTED Final   Respiratory Syncytial Virus NOT DETECTED NOT DETECTED Final    Bordetella pertussis NOT DETECTED NOT DETECTED Final   Chlamydophila pneumoniae NOT DETECTED NOT DETECTED Final   Mycoplasma pneumoniae NOT DETECTED NOT DETECTED Final    Comment: Performed at [facility], [place]. [address] [place], [place] [address]     Time coordinating discharge: Over 30 minutes  SIGNED:   Loretha Stapler, MD  [facility] 02/24/2017, 3:30 PM Pager [phone]  If 7PM-7AM, please contact night-coverage [email or web address] Password [email or web address]

## 2017-02-24 NOTE — Patient Outreach (Addendum)
[facility]) Care Management  02/24/2017  Mary [name] Mar 15, 1951 [id number]   Care Coordination   [facility] CM received referral from [place] liaison, to engage for transition of care calls and evaluate for monthly home visits. Hospital dx COPD Exacerbation, Acute on chronic respiratory failure with hypoxia She was discharged home per hospital discharge summary with [facility] for HHPT and oxygen, pcp is [facility] [place] [facility] and Pulmonologist is E Hawkins for follow up care  [facility] CM attempted to reach Mary [name] to schedule a [facility] CM home visit [facility] CM left a voice message to include [facility] CM mobile number for a return call.   Plans:  [facility] CM will attempt to contact Mary [name] again for scheduling of transition of care home visit and assessment of home needs  Kimberly L. Lavina Hamman, RN, BSN, [facility] Management [phone]

## 2017-02-26 ENCOUNTER — Encounter: Payer: Self-pay | Admitting: *Deleted

## 2017-02-26 ENCOUNTER — Telehealth: Payer: Self-pay | Admitting: *Deleted

## 2017-02-26 NOTE — Patient Outreach (Signed)
[facility]) Care Management  02/26/2017  SHERRIA RIEMANN 07-28-1951 [id number]   Care coordination  [facility] CM has not had a return cal from Mrs Mcauliff and sent unsuccessful outreach letter to her.  Plan to wait for response from Mrs Aviona Martenson L. Lavina Hamman, RN, BSN, [place] Care Management [phone]

## 2017-02-26 NOTE — Patient Outreach (Signed)
[facility]) Care Management  02/26/2017  Mary [name] 10/11/1951 [id number]     Care Coordination  [facility] CM left voice message for including [facility] CM mobile number for a return call.  [facility] CM attempting to schedule [facility] CM home visit with after [name] was discharged from hospital on 02/24/17 ,   [facility] CM attempted to reach [name] on 02/24/17, 02/25/17 and today 02/26/17 without success  [facility] CM checked [place] noted for a better possible number when [facility] lists her home and mobile numbers are the same numbers [facility] CM notes [name] to indicate that referral is for post hospital discharge follow up related to a diagnosis of COPD. Primary care provider to be Allyn Kenner, MD. Patient states she previously received [facility] services and they were helpful. She is anxious over the potential of having to go home with 02 and feels it would be helpful to have [facility] services again. Consent form signed. Patient gave [phone]) and [phone]) as the best number to reach her. She also gave written permission to call her granddaughter Mary [name] at [phone] if she can not be reached  [facility] CM called [phone], no answer and the voice mail box has not been set up to allow a message to be left  [facility] CM called [phone] and reached a Mary [name] versus her grand daughter Museum/gallery conservator.  Mary [name] states she is doing well and he is at work at this time.  He agreed to allow [facility] CM to send him a text including CM name and mobile number at [phone] to give to [name] for a return call to CM. CM explained to Mary [name] that his number was given to the hospital liaison as Mary [name]' number to call if [name] could not be reached and apologized to him for the inconvenience. He voiced understanding  Plans:  [facility] CM will attempt to contact [name] again today but will send her an unsuccessful outreach letter if no return call later today Routed note to [place]. Lavina Hamman, RN, BSN,  [place] Care Management [phone]

## 2017-03-01 ENCOUNTER — Other Ambulatory Visit: Payer: Self-pay | Admitting: *Deleted

## 2017-03-01 DIAGNOSIS — E785 Hyperlipidemia, unspecified: Secondary | ICD-10-CM | POA: Diagnosis not present

## 2017-03-01 DIAGNOSIS — Z7951 Long term (current) use of inhaled steroids: Secondary | ICD-10-CM | POA: Diagnosis not present

## 2017-03-01 DIAGNOSIS — G894 Chronic pain syndrome: Secondary | ICD-10-CM | POA: Diagnosis not present

## 2017-03-01 DIAGNOSIS — J449 Chronic obstructive pulmonary disease, unspecified: Secondary | ICD-10-CM | POA: Diagnosis not present

## 2017-03-01 DIAGNOSIS — I5022 Chronic systolic (congestive) heart failure: Secondary | ICD-10-CM | POA: Diagnosis not present

## 2017-03-01 DIAGNOSIS — M1991 Primary osteoarthritis, unspecified site: Secondary | ICD-10-CM | POA: Diagnosis not present

## 2017-03-01 DIAGNOSIS — J441 Chronic obstructive pulmonary disease with (acute) exacerbation: Secondary | ICD-10-CM | POA: Diagnosis not present

## 2017-03-01 DIAGNOSIS — Z8673 Personal history of transient ischemic attack (TIA), and cerebral infarction without residual deficits: Secondary | ICD-10-CM | POA: Diagnosis not present

## 2017-03-01 DIAGNOSIS — I4891 Unspecified atrial fibrillation: Secondary | ICD-10-CM | POA: Diagnosis not present

## 2017-03-01 DIAGNOSIS — Z7982 Long term (current) use of aspirin: Secondary | ICD-10-CM | POA: Diagnosis not present

## 2017-03-01 DIAGNOSIS — Z7984 Long term (current) use of oral hypoglycemic drugs: Secondary | ICD-10-CM | POA: Diagnosis not present

## 2017-03-01 DIAGNOSIS — E43 Unspecified severe protein-calorie malnutrition: Secondary | ICD-10-CM | POA: Diagnosis not present

## 2017-03-01 DIAGNOSIS — E1140 Type 2 diabetes mellitus with diabetic neuropathy, unspecified: Secondary | ICD-10-CM | POA: Diagnosis not present

## 2017-03-01 NOTE — Patient Outreach (Signed)
[facility]) Care Management  03/01/2017  Mary [name] 1951-02-25 [id number]   Care coordination  Call attempt to reach Mary [name] after noting missed calls x 4 left on [facility] CM mobile number from Saturday Feb 27 2017 but no voice messages left from her. Unsuccessful letter sent on Friday  Feb 26 2017. THN CM left voice message at [phone] possible call from Mary [name]  Plan [facility] CM will see if Mary [name] returns call to [name] during business hours or attempts to leave [facility] CM a voice message  Joelene Millin L. Lavina Hamman, RN, BSN, [place] Care Management [phone]

## 2017-03-10 ENCOUNTER — Other Ambulatory Visit: Payer: Self-pay | Admitting: *Deleted

## 2017-03-10 DIAGNOSIS — I2510 Atherosclerotic heart disease of native coronary artery without angina pectoris: Secondary | ICD-10-CM | POA: Diagnosis not present

## 2017-03-10 DIAGNOSIS — J449 Chronic obstructive pulmonary disease, unspecified: Secondary | ICD-10-CM | POA: Diagnosis not present

## 2017-03-10 DIAGNOSIS — Z8673 Personal history of transient ischemic attack (TIA), and cerebral infarction without residual deficits: Secondary | ICD-10-CM | POA: Diagnosis not present

## 2017-03-10 DIAGNOSIS — E1140 Type 2 diabetes mellitus with diabetic neuropathy, unspecified: Secondary | ICD-10-CM | POA: Diagnosis not present

## 2017-03-10 DIAGNOSIS — I509 Heart failure, unspecified: Secondary | ICD-10-CM | POA: Diagnosis not present

## 2017-03-10 NOTE — Patient Outreach (Addendum)
[place] [facility]) Care Management   03/10/2017   Mary [name] 10-Jan-1951 [id number]  Mary [name] is an 66 y.o. female with a PMH of COPD not apparently on home O2; chronic systolic HF (EF 71-24% in 2015), afib on Pradaxa; DM and anxiety presenting to the hospital on 02/18/17 with worsening SOB.  She was treated with IV Lasix, steroids, and azithromycin. She confirmed in the hospital she still smokes occasional Cigarettes. Was admitted to SDU for COPD Exacerbation and placed on and weaned off of a BiPAP She was transferred to the medical floor on 02/21/17 Her Hospitalization is complicated by uncontrolled hyperglycemia with steroid use. She was discharged on 02/24/17.  This [facility]N CM received a referral on 02/24/17 from [place] liaison, to engage for transition of care calls and evaluate for monthly home visits. Hospital dx COPD Exacerbation, Acute on chronic respiratory failure with hypoxia She was discharged on 02/24/17 home per hospital discharge summary with [facility] for HH[facility] and oxygen, pcp is Z Nevada Crane and Pulmonologist is E Hawkins for follow up care.  THN CM called Mrs Mary [name] on 02/24/17, 02/25/17 and 02/26/17 and sent a [facility]N unsuccessful letter. Mrs Mary [name] finally returned a call to [place] and agreed to a home visit on 03/10/17.  Subjective:  "Dr Luan Pulling came in and told me about cancer. I don't think I have it"   Objective:  BP 108/60 (BP Location: Right Arm, Patient Position: Sitting, Cuff Size: Normal)   Pulse 68   Resp 16   SpO2 91%   Review of Systems  Constitutional: Negative for chills, diaphoresis, fever, malaise/fatigue and weight loss.       Was 192 lbs 3 yrs ago now 119-121 lbs  HENT: Positive for congestion and sinus pain. Negative for ear discharge, ear pain, hearing loss, nosebleeds, sore throat and tinnitus.   Eyes: Negative for blurred vision, double vision, photophobia, pain, discharge and redness.  Respiratory: Positive for cough, shortness of  breath and wheezing. Negative for hemoptysis, sputum production and stridor.        Cough at intervals with talking during assessment  Cardiovascular: Negative.  Negative for chest pain, palpitations, orthopnea, claudication, leg swelling and PND.  Gastrointestinal: Negative.  Negative for abdominal pain, blood in stool, constipation, diarrhea, heartburn, melena, nausea and vomiting.  Genitourinary: Negative.  Negative for dysuria, flank pain, frequency, hematuria and urgency.  Musculoskeletal: Positive for back pain, falls and joint pain. Negative for myalgias and neck pain.       Back and bilateral hip pain Neuropathy in legs get weak  Skin: Negative for itching and rash.  Neurological: Positive for dizziness and headaches. Negative for tingling, tremors, sensory change, speech change, focal weakness, seizures, loss of consciousness and weakness.  Endo/Heme/Allergies: Positive for environmental allergies. Negative for polydipsia. Bruises/bleeds easily.       Has old bruising form IV sticks at hospital on both forearms Pollen concerns  Psychiatric/Behavioral: Negative for depression, hallucinations, memory loss, substance abuse and suicidal ideas. The patient is nervous/anxious. The patient does not have insomnia.        "stressed" "forgetful at times"    Physical Exam  Constitutional: She is oriented to person, place, and time. She appears well-developed and well-nourished.  HENT:  Head: Normocephalic and atraumatic.  Eyes: Conjunctivae are normal. Pupils are equal, round, and reactive to light.  Neck: Normal range of motion. Neck supple.  Cardiovascular: Normal rate and regular rhythm.   Respiratory: Effort normal and breath  sounds normal.  GI: Soft. Bowel sounds are normal.  Musculoskeletal: Normal range of motion.  Neurological: She is alert and oriented to person, place, and time.  Skin: Skin is warm and dry.  Psychiatric: She has a normal mood and affect. Her behavior is normal.  Judgment and thought content normal.    Encounter Medications:   Outpatient Encounter Prescriptions as of 03/10/2017  Medication Sig Note  . ACCU-CHEK AVIVA PLUS test strip USE TO TEST 3 TO 4 TIMES DAILY OR AS DIRECTED.   Marland Kitchen acetaminophen (TYLENOL) 500 MG tablet Take 500 mg by mouth every 6 (six) hours as needed for mild pain.   Marland Kitchen ALPRAZolam (XANAX) 1 MG tablet Take 1 mg by mouth 4 (four) times daily.    Marland Kitchen aspirin EC 81 MG tablet Take 81 mg by mouth daily.   Marland Kitchen atorvastatin (LIPITOR) 40 MG tablet Take 1 tablet (40 mg total) by mouth daily. KEEP OV.   Marland Kitchen DIGOX 125 MCG tablet TAKE ONE TABLET BY MOUTH ONCE DAILY.   . fluticasone (FLONASE) 50 MCG/ACT nasal spray Place 2 sprays into both nostrils daily. (Patient taking differently: Place 2 sprays into both nostrils daily as needed for allergies. ) 03/18/2015: Patient states she currently takes as needed for seasonal allergies.  . furosemide (LASIX) 40 MG tablet TAKE ONE TABLET BY MOUTH ONCE DAILY. 03/10/2017: Pt states she only take prn if legs swell along with potassium to prevent cramps  . gabapentin (NEURONTIN) 100 MG capsule Take 1 capsule by mouth 3 (three) times daily.   Marland Kitchen glipiZIDE (GLUCOTROL XL) 2.5 MG 24 hr tablet 2.5 mg daily.    Marland Kitchen JANUVIA 100 MG tablet TAKE ONE TABLET BY MOUTH DAILY.   Marland Kitchen levalbuterol (XOPENEX) 0.63 MG/3ML nebulizer solution Take 3 mLs (0.63 mg total) by nebulization every 6 (six) hours.   Marland Kitchen lisinopril (PRINIVIL,ZESTRIL) 2.5 MG tablet Take 1 tablet (2.5 mg total) by mouth daily.   . Magnesium Oxide 250 MG TABS Take 250 mg by mouth daily.    . metFORMIN (GLUCOPHAGE) 850 MG tablet TAKE 1 TABLET BY MOUTH TWICE DAILY WITH MEALS.   . metoprolol (LOPRESSOR) 50 MG tablet Take 2 tablets in the morning and 1 1/2 tablets at night   . montelukast (SINGULAIR) 10 MG tablet Take 1 tablet by mouth daily.   . Multiple Vitamin (MULTIVITAMIN) capsule Take 1 capsule by mouth daily.   . potassium chloride SA (K-DUR,KLOR-CON) 20 MEQ tablet Take 1  tablet (20 mEq total) by mouth daily.   Marland Kitchen PRADAXA 150 MG CAPS capsule TAKE ONE CAPSULE BY MOUTH TWICE DAILY.   . sodium chloride (OCEAN) 0.65 % nasal spray Place 1 spray into the nose as needed for congestion. Congestion   . SYMBICORT 160-4.5 MCG/ACT inhaler INHALE 2 PUFFS INTO THE LUNGS TWICE DAILY. RINSE MOUTH AFTER USE.   Marland Kitchen acetylcysteine (MUCOMYST) 20 % nebulizer solution Take 3 mLs by nebulization every 6 (six) hours. (Patient not taking: Reported on 03/10/2017)    No facility-administered encounter medications on file as of 03/10/2017.     Functional Status:   In your present state of health, do you have any difficulty performing the following activities: 03/10/2017 02/18/2017  Hearing? N N  Vision? N N  Difficulty concentrating or making decisions? N N  Walking or climbing stairs? Y N  Dressing or bathing? N N  Doing errands, shopping? N N  Preparing Food and eating ? N -  Using the Toilet? N -  In the past six months, have  you accidently leaked urine? N -  Do you have problems with loss of bowel control? N -  Managing your Medications? N -  Managing your Finances? N -  Housekeeping or managing your Housekeeping? N -  Some recent data might be hidden    Fall/Depression Screening:    Fall Risk  03/10/2017 09/09/2015 03/18/2015  Falls in the past year? Yes No No  Number falls in past yr: 2 or more - -  Injury with Fall? No - -  Risk Factor Category  High Fall Risk - -  Risk for fall due to : History of fall(s);Impaired balance/gait;Medication side effect Impaired balance/gait Impaired balance/gait;Medication side effect  Risk for fall due to (comments): - - -  Follow up Education provided;Falls prevention discussed - -   PHQ 2/9 Scores 03/10/2017 09/09/2015 03/18/2015 02/28/2015 01/25/2015  PHQ - 2 Score 0 2 1 2  0  PHQ- 9 Score - 6 - 6 -    Assessment:   Presents today ambulating without DME and assessment completed in her living room.  She reports she was visited today prior to  [place] arrival by a [employer] female representative who states that "[name]" form [employer] usually visits. Mrs Cortopassi when asked about smoking reports she does not smoke although CM noted cigarette smoke smell.  Mrs Lienhard states her neighbor smokes and "it comes through the vents"  Acute Medical Conditions:  Mrs Boer reports she fell after arriving home with her grand daughter present Reports she was coming into her living room and she hit her head on her recliner by the door after she got dizzy.  She reports she did not blank out and it hurt when she hit her head She reports her grand daughter found a red bump without bleeding on head.  She reports sitting in her recliner and felt better.    Chronic Medical Conditions (COPD, CHF, DM, HTN, afib, falls, depression)  Medications - Lasix discrepancy Dr [place] Crane take once a day 6-7 days She reports albuterol is used during the day but it discontinued on hospital discharge instructions and is not on medication list She also uses her netimed prn  Pt has not taken mycomyst as ordered and shows [facility]N CM a yellow sheet of paper with [facility] [phone]) note indicating the date of 02/24/17 stating the medicine is not at [facility]  In [place]  Report rub leg and feet in original super strength blue emu prn (not often from [facility] versus  take Neurontin daily   DME Has a scale, cane and elevated toilet seat. Reports use of oxygen is prn NO cpap/bipap use  Social- lives alone with support of her grand daughter, grand daughter boyfriend, female neighbor and best female friend  Future medical appointments: Appointment with Janine Ores, 03/11/17 labs done every 4 months and she needs to make an appointment with Dr Luan Pulling Stating she is to see Dr Claiborne Billings also  Plan:  [place] do not call number after she reported complaints of frequent unwanted calls  [facility] CM encouraged her to check bp with her cuff when dizzy  [facility] pharmacy consult for > 10  medications, Need medications checked for side effects of falls, she is having recurrent falls, Reports to CM not taking mediations as ordered, admissions x 2 in last 6 months follow up 1- 2 weeks for home visit THN to route note to pcp Nevada Crane) and pulmonologist Luan Pulling)   [place] Problem One  Most Recent Value  Care Plan Problem One  knowledge deficit of home care for COPD  Role Documenting the Problem One  Care Management Coordinator  Care Plan for Problem One  Active  THN Long Term Goal   Over the next 60 days the patient will be able to verbilize understanding of home care for COPD  THN Long Term Goal Start Date  03/10/17  Interventions for Problem One Long Term Goal  assess knowledge of home care for COPD, educate on COPD/treatment/zones, evaluate knowledge of COPD   THN CM Short Term Goal #1   Over the next 30 days patient will verbalize and indicate knowledge of COPD home care  [facility] CM Short Term Goal #1 Start Date  03/10/17  Interventions for Short Term Goal #1  assess knowledge of home care for COPD, educate on COPD/treatment/zones, evaluate knowledge of COPD     THN CM Care Plan Problem Two     Most Recent Value  Care Plan Problem Two  Recurrent falls possibly related to medications, home safety  Role Documenting the Problem Two  Care Management [place] for Problem Two  Active  Interventions for Problem Two Long Term Goal   assess home, pharmacy referral for medicine check, educate on medicines, review fall risks/precautions  THN Long Term Goal  Over the next 45 days medications and home will be checked for fall prevention precautions  THN Long Term Goal Start Date  03/10/17  THN CM Short Term Goal #1   Over the next 30 days patient medicines will be checked by [place] and she will be able to verbalize knowledge of fall risks prevention measures  THN CM Short Term Goal #1 Start Date  03/10/17  Interventions for Short Term Goal #2   assess home,  pharmacy referral for medicine check, educate on medicines, review fall risks/precautions      Donya Tomaro L. Lavina Hamman, RN, BSN, [place] Care Management [phone]

## 2017-03-11 DIAGNOSIS — R0602 Shortness of breath: Secondary | ICD-10-CM | POA: Diagnosis not present

## 2017-03-11 DIAGNOSIS — J449 Chronic obstructive pulmonary disease, unspecified: Secondary | ICD-10-CM | POA: Diagnosis not present

## 2017-03-12 ENCOUNTER — Encounter (HOSPITAL_BASED_OUTPATIENT_CLINIC_OR_DEPARTMENT_OTHER): Payer: Medicare Other

## 2017-03-22 ENCOUNTER — Encounter: Payer: Self-pay | Admitting: *Deleted

## 2017-03-23 ENCOUNTER — Other Ambulatory Visit: Payer: Self-pay | Admitting: Cardiovascular Disease

## 2017-03-24 ENCOUNTER — Other Ambulatory Visit: Payer: Self-pay | Admitting: *Deleted

## 2017-03-24 DIAGNOSIS — J441 Chronic obstructive pulmonary disease with (acute) exacerbation: Secondary | ICD-10-CM | POA: Diagnosis not present

## 2017-03-24 DIAGNOSIS — I5032 Chronic diastolic (congestive) heart failure: Secondary | ICD-10-CM | POA: Diagnosis not present

## 2017-03-24 DIAGNOSIS — I10 Essential (primary) hypertension: Secondary | ICD-10-CM | POA: Diagnosis not present

## 2017-03-24 DIAGNOSIS — E119 Type 2 diabetes mellitus without complications: Secondary | ICD-10-CM | POA: Diagnosis not present

## 2017-03-27 DIAGNOSIS — J449 Chronic obstructive pulmonary disease, unspecified: Secondary | ICD-10-CM | POA: Diagnosis not present

## 2017-04-05 ENCOUNTER — Other Ambulatory Visit: Payer: Self-pay | Admitting: Pharmacist

## 2017-04-05 NOTE — Patient Outreach (Signed)
[place] [facility]) Care Management  04/05/2017  Mary [name] 1951-06-07 [id number]  1141:  Patient was referred to [place] by [place] for polypharmacy medication review.  No answer and voice mail did not pick up.    Patient called back, purpose of call explained to her.  She reports it was a bad time and requested a call back this afternoon.  She rushed off phone before HIPAA details could be verified   1424:    Attempted to call back this afternoon per her request.  No answer, HIPAA compliant message left requesting call.    Plan:  Will make another outreach attempt next week if no return call from patient.   Karrie Meres, PharmD, [place] [phone]

## 2017-04-08 ENCOUNTER — Ambulatory Visit: Payer: Self-pay | Admitting: Pharmacist

## 2017-04-12 ENCOUNTER — Ambulatory Visit: Payer: Self-pay | Admitting: Pharmacist

## 2017-04-13 ENCOUNTER — Ambulatory Visit: Payer: Self-pay | Admitting: Pharmacist

## 2017-04-14 ENCOUNTER — Other Ambulatory Visit: Payer: Self-pay | Admitting: Pharmacist

## 2017-04-14 NOTE — Patient Outreach (Signed)
[place] [facility]) Care Management  04/14/2017  Mary [name] May 17, 1951 [id number]  Successful phone outreach attempt two to patient. Patient verified her name and date of birth.  Explained purpose of call to patient was to review her medications after [place] referred her to [facility] Pharmacist.   Patient states it is a bad time as she has company.  Offered to schedule a home visit or time to call her to review medications---she reports she would prefer a phone call.   Plan:  Phone call scheduled with patient to review her medications.   Karrie Meres, PharmD, [place] [phone]

## 2017-04-19 ENCOUNTER — Other Ambulatory Visit: Payer: Self-pay | Admitting: Pharmacist

## 2017-04-19 NOTE — Patient Outreach (Signed)
[facility]) Care Management  [place]   04/19/2017  Mary [name] 03/29/1951 [id number]  Subjective:  Patient was referred to [place] Pharmacist for medication review by [facility] RN [name], to review medications that may contribute to falls.    Successful phone outreach to patient, initially left a HIPAA compliant message and patient returned call.  HIPAA details verified with patient.    Patient's past medical history is significant for:  CAD, atrial fibrillation, hypertension, COPD, diabetes mellitus, hyperlipidemia, anxiety.  Patient had preferred to review medications over the phone versus a home visit.    Patient denies concerns with taking her medications---she reports she gets her medications from [place].  She did mention her Januvia, Pradaxa, and Symbicort can be expensive.     Objective:   Current Medications: Current Outpatient Prescriptions  Medication Sig Dispense Refill  . ACCU-CHEK AVIVA PLUS test strip USE TO TEST 3 TO 4 TIMES DAILY OR AS DIRECTED. 100 each PRN  . acetaminophen (TYLENOL) 500 MG tablet Take 500 mg by mouth every 6 (six) hours as needed for mild pain.    Marland Kitchen acetylcysteine (MUCOMYST) 20 % nebulizer solution Take 3 mLs by nebulization every 6 (six) hours. (Patient not taking: Reported on 03/10/2017) 30 mL 0  . ALPRAZolam (XANAX) 1 MG tablet Take 1 mg by mouth 4 (four) times daily.     Marland Kitchen aspirin EC 81 MG tablet Take 81 mg by mouth daily.    Marland Kitchen atorvastatin (LIPITOR) 40 MG tablet TAKE ONE TABLET BY MOUTH DAILY. 90 tablet 3  . dabigatran (PRADAXA) 150 MG CAPS capsule Take 1 capsule (150 mg total) by mouth 2 (two) times daily. 60 capsule 1  . DIGOX 125 MCG tablet TAKE ONE TABLET BY MOUTH ONCE DAILY. 30 tablet 10  . fluticasone (FLONASE) 50 MCG/ACT nasal spray Place 2 sprays into both nostrils daily. (Patient taking differently: Place 2 sprays into both nostrils daily as needed for allergies. ) 16 g 6  . furosemide (LASIX) 40 MG  tablet TAKE ONE TABLET BY MOUTH ONCE DAILY. 30 tablet 6  . gabapentin (NEURONTIN) 100 MG capsule Take 1 capsule by mouth 3 (three) times daily.    Marland Kitchen glipiZIDE (GLUCOTROL XL) 2.5 MG 24 hr tablet 2.5 mg daily.     Marland Kitchen JANUVIA 100 MG tablet TAKE ONE TABLET BY MOUTH DAILY. 30 tablet 0  . levalbuterol (XOPENEX) 0.63 MG/3ML nebulizer solution Take 3 mLs (0.63 mg total) by nebulization every 6 (six) hours. 3 mL 0  . lisinopril (PRINIVIL,ZESTRIL) 2.5 MG tablet Take 1 tablet (2.5 mg total) by mouth daily. 30 tablet 11  . Magnesium Oxide 250 MG TABS Take 250 mg by mouth daily.     . metFORMIN (GLUCOPHAGE) 850 MG tablet TAKE 1 TABLET BY MOUTH TWICE DAILY WITH MEALS. 60 tablet 0  . metoprolol (LOPRESSOR) 50 MG tablet Take 2 tablets in the morning and 1 1/2 tablets at night 105 tablet 11  . montelukast (SINGULAIR) 10 MG tablet Take 1 tablet by mouth daily.    . Multiple Vitamin (MULTIVITAMIN) capsule Take 1 capsule by mouth daily.    . potassium chloride SA (K-DUR,KLOR-CON) 20 MEQ tablet Take 1 tablet (20 mEq total) by mouth daily. 30 tablet 0  . sodium chloride (OCEAN) 0.65 % nasal spray Place 1 spray into the nose as needed for congestion. Congestion    . SYMBICORT 160-4.5 MCG/ACT inhaler INHALE 2 PUFFS INTO THE LUNGS TWICE DAILY. RINSE MOUTH AFTER USE. 10.2 g 0  No current facility-administered medications for this visit.     Functional Status: In your present state of health, do you have any difficulty performing the following activities: 03/10/2017 02/18/2017  Hearing? N N  Vision? N N  Difficulty concentrating or making decisions? N N  Walking or climbing stairs? Y N  Dressing or bathing? N N  Doing errands, shopping? N N  Preparing Food and eating ? N -  Using the Toilet? N -  In the past six months, have you accidently leaked urine? N -  Do you have problems with loss of bowel control? N -  Managing your Medications? N -  Managing your Finances? N -  Housekeeping or managing your  Housekeeping? N -  Some recent data might be hidden    Fall/Depression Screening: Fall Risk  03/10/2017 09/09/2015 03/18/2015  Falls in the past year? Yes No No  Number falls in past yr: 2 or more - -  Injury with Fall? No - -  Risk Factor Category  High Fall Risk - -  Risk for fall due to : History of fall(s);Impaired balance/gait;Medication side effect Impaired balance/gait Impaired balance/gait;Medication side effect  Risk for fall due to (comments): - - -  Follow up Education provided;Falls prevention discussed - -   PHQ 2/9 Scores 03/10/2017 09/09/2015 03/18/2015 02/28/2015 01/25/2015  PHQ - 2 Score 0 2 1 2  0  PHQ- 9 Score - 6 - 6 -    Assessment:  Medication review per patient report and review of medication list in this chart.   Drugs sorted by system:  Neurologic/Psychologic: -alprazolam -gabapentin  Cardiovascular: -aspirin -atorvastatin -dabigatran (Pradaxa)  -digoxin -furosemide -lisinopril  -metoprolol tartrate  Pulmonary/Allergy: -Symbicort -levalbuterol nebs---she reports she also has albuterol nebs at home but only uses one or the other -montelukast  Endocrine: -glipizide -metformin -sitagliptin (Januvia)   Pain: -acetaminophen as needed  Vitamins/Minerals: -magnesium -multivitamin -potassium chloride  Miscellaneous: -nasal saline   Other issues noted:   The following medications are on medication list in chart and patient reports not using:  -acetylcysteine neb solution -fluticasone nasal spray  Please clarify if patient is to use these medications.   -Patient reports sometimes not taking evening Symbicort dose if she used her nebulizer---counseled patient on difference between "rescue" medication, albuterol, and maintenance medication, Symbicort.    Medication assistance: -Discussed Social Security Extra Help---she reports she is over income requirement.  -Discussed manufacturer patient assistance programs---[employer] (Januvia), [employer] (Pradaxa), and [employer] (Symbicort)  She did not wish to apply to manufacturer patient assistance---she understands these programs are free to apply to if she changes her mind and wishes to apply to see if she is eligible for assistance.    Medications that may contribute to dizziness: -alprazolam---she denies experiencing dizziness from this medication---she is also on a higher dose of this medication  -gabapentin---increased risk of dizziness/drowsiness -glipizide---risk of hypoglycemia with sulfonylureas in the elderly   Plan:  Patient denies questions/concerns with her medications at this time.   Patient requests a medication list be sent to her address listed in chart. --She understands she needs to review each medication from her prescribers to ensure each of her medical providers have the same medication list.    Will route note to PCP.    Will let [facility] RN [name] know of pharmacy case closure as patient reports no further needs.    Karrie Meres, PharmD, [place] [phone]

## 2017-04-20 NOTE — Patient Outreach (Signed)
Mary [name]) Care Management   03/24/2017  Mary [name] Jun 19, 1951 [id number]  Mary [name] is an 66 y.o. female with a PMH of COPD not apparently on home O2; chronic systolic HF (EF 80-99% in 2015), afib on Pradaxa; DM and anxiety presenting to the hospital on 02/18/17 with worsening SOB.  She was treated with IV Lasix, steroids, and azithromycin. She confirmed in the hospital she still smokes occasional Cigarettes. Was admitted to SDU for COPD Exacerbation and placed on and weaned off of a BiPAP She was transferred to the medical floor on 02/21/17 Her Hospitalization is complicated by uncontrolled hyperglycemia with steroid use. She was discharged on 02/24/17.  This [facility] CM received a referral on 02/24/17 from [place] liaison, to engage for transition of care calls and evaluate for monthly home visits. Hospital dx COPD Exacerbation, Acute on chronic respiratory failure with hypoxia She was discharged on 02/24/17 home per hospital discharge summary with [facility] and [facility], pcp is Z [place] Crane and Pulmonologist is E Hawkins for follow up care.  [facility] CM called Mary [name] on 02/24/17, 02/25/17 and 02/26/17 and sent a [facility] unsuccessful letter. Mary [name] finally returned a call to Mary [name] and agreed to a home visit on 03/10/17. This is the second home visit for Mary [name]   Subjective: "I told him I prefer to go back to my family doctor"  Objective:   Review of Systems  Constitutional: Negative for chills, diaphoresis, fever, malaise/fatigue and weight loss.  HENT: Negative for congestion, ear discharge, ear pain, hearing loss, nosebleeds, sinus pain, sore throat and tinnitus.   Eyes: Positive for redness. Negative for blurred vision, double vision, photophobia, pain and discharge.       Blacken left eye   Respiratory: Negative.  Negative for cough, hemoptysis, sputum production, shortness of breath, wheezing and stridor.   Cardiovascular: Negative.  Negative for  chest pain, palpitations, orthopnea, claudication, leg swelling and PND.  Gastrointestinal: Negative for abdominal pain, blood in stool, constipation, diarrhea, heartburn, melena, nausea and vomiting.  Genitourinary: Negative.  Negative for dysuria, flank pain, frequency, hematuria and urgency.  Musculoskeletal: Negative.  Negative for back pain, falls, joint pain, myalgias and neck pain.  Skin: Negative for itching and rash.       Blacken left eye   Neurological: Negative.  Negative for dizziness, tingling, tremors, sensory change, speech change, focal weakness, seizures, loss of consciousness, weakness and headaches.  Endo/Heme/Allergies: Negative.  Negative for environmental allergies and polydipsia. Does not bruise/bleed easily.  Psychiatric/Behavioral: Negative.  Negative for depression, hallucinations, memory loss, substance abuse and suicidal ideas. The patient is not nervous/anxious and does not have insomnia.     Physical Exam  Constitutional: She is oriented to person, place, and time. She appears well-developed and well-nourished.  HENT:  Head: Normocephalic and atraumatic.  Eyes: Conjunctivae are normal. Pupils are equal, round, and reactive to light.  Neck: Normal range of motion.  Cardiovascular: Normal rate, normal heart sounds and intact distal pulses.   Respiratory: She has wheezes in the left lower field.      GI: Soft. Bowel sounds are normal.  Musculoskeletal: Normal range of motion.  Neurological: She is alert and oriented to person, place, and time.  Skin: Skin is warm and dry.  Psychiatric: She has a normal mood and affect. Her behavior is normal. Judgment and thought content normal.    Encounter Medications:   Outpatient Encounter Prescriptions as of 03/24/2017  Medication  Sig Note  . ACCU-CHEK AVIVA PLUS test strip USE TO TEST 3 TO 4 TIMES DAILY OR AS DIRECTED.   Marland Kitchen acetaminophen (TYLENOL) 500 MG tablet Take 500 mg by mouth every 6 (six) hours as needed for  mild pain.   Marland Kitchen acetylcysteine (MUCOMYST) 20 % nebulizer solution Take 3 mLs by nebulization every 6 (six) hours. (Patient not taking: Reported on 03/10/2017)   . ALPRAZolam (XANAX) 1 MG tablet Take 1 mg by mouth 4 (four) times daily.    Marland Kitchen aspirin EC 81 MG tablet Take 81 mg by mouth daily.   Marland Kitchen atorvastatin (LIPITOR) 40 MG tablet TAKE ONE TABLET BY MOUTH DAILY.   . dabigatran (PRADAXA) 150 MG CAPS capsule Take 1 capsule (150 mg total) by mouth 2 (two) times daily.   Marland Kitchen DIGOX 125 MCG tablet TAKE ONE TABLET BY MOUTH ONCE DAILY.   . fluticasone (FLONASE) 50 MCG/ACT nasal spray Place 2 sprays into both nostrils daily. (Patient taking differently: Place 2 sprays into both nostrils daily as needed for allergies. ) 03/18/2015: Patient states she currently takes as needed for seasonal allergies.  . furosemide (LASIX) 40 MG tablet TAKE ONE TABLET BY MOUTH ONCE DAILY. 03/10/2017: Pt states she only take prn if legs swell along with potassium to prevent cramps  . gabapentin (NEURONTIN) 100 MG capsule Take 1 capsule by mouth 3 (three) times daily.   Marland Kitchen glipiZIDE (GLUCOTROL XL) 2.5 MG 24 hr tablet 2.5 mg daily.    Marland Kitchen JANUVIA 100 MG tablet TAKE ONE TABLET BY MOUTH DAILY.   Marland Kitchen levalbuterol (XOPENEX) 0.63 MG/3ML nebulizer solution Take 3 mLs (0.63 mg total) by nebulization every 6 (six) hours.   Marland Kitchen lisinopril (PRINIVIL,ZESTRIL) 2.5 MG tablet Take 1 tablet (2.5 mg total) by mouth daily.   . Magnesium Oxide 250 MG TABS Take 250 mg by mouth daily.    . metFORMIN (GLUCOPHAGE) 850 MG tablet TAKE 1 TABLET BY MOUTH TWICE DAILY WITH MEALS.   . metoprolol (LOPRESSOR) 50 MG tablet Take 2 tablets in the morning and 1 1/2 tablets at night   . montelukast (SINGULAIR) 10 MG tablet Take 1 tablet by mouth daily.   . Multiple Vitamin (MULTIVITAMIN) capsule Take 1 capsule by mouth daily.   . potassium chloride SA (K-DUR,KLOR-CON) 20 MEQ tablet Take 1 tablet (20 mEq total) by mouth daily.   . sodium chloride (OCEAN) 0.65 % nasal spray Place  1 spray into the nose as needed for congestion. Congestion   . SYMBICORT 160-4.5 MCG/ACT inhaler INHALE 2 PUFFS INTO THE LUNGS TWICE DAILY. RINSE MOUTH AFTER USE.    No facility-administered encounter medications on file as of 03/24/2017.     Functional Status:   In your present state of health, do you have any difficulty performing the following activities: 03/10/2017 02/18/2017  Hearing? N N  Vision? N N  Difficulty concentrating or making decisions? N N  Walking or climbing stairs? Y N  Dressing or bathing? N N  Doing errands, shopping? N N  Preparing Food and eating ? N -  Using the Toilet? N -  In the past six months, have you accidently leaked urine? N -  Do you have problems with loss of bowel control? N -  Managing your Medications? N -  Managing your Finances? N -  Housekeeping or managing your Housekeeping? N -  Some recent data might be hidden    Fall/Depression Screening:    Fall Risk  03/10/2017 09/09/2015 03/18/2015  Falls in the past year? Yes  No No  Number falls in past yr: 2 or more - -  Injury with Fall? No - -  Risk Factor Category  High Fall Risk - -  Risk for fall due to : History of fall(s);Impaired balance/gait;Medication side effect Impaired balance/gait Impaired balance/gait;Medication side effect  Risk for fall due to (comments): - - -  Follow up Education provided;Falls prevention discussed - -   PHQ 2/9 Scores 03/10/2017 09/09/2015 03/18/2015 02/28/2015 01/25/2015  PHQ - 2 Score 0 2 1 2  0  PHQ- 9 Score - 6 - 6 -    Assessment:    Mary [place] appears in her living room after arriving home from an office visit to Dr Luan Pulling She denies any medical concerns today  She is visited by her grand daughter and her two children.  Mary Osterloh is distracted to monitor other great grandchildren Her home is scented with candles She received mucinex samples   Plan:   Follow up with Mary [place]lin in 1-2 weeks  Collaborate with [facility] for medication assistance with cost of  pradexa and nhalers plus to assist with possible side effects of medications that may cause dizziness and falls Assisted in finding a cost efficient pulse oximeter online that her grand daughter ordered for her   [facility] CM Dr Luan Pulling updated on pt preference to return to pcp, Dr [place] Crane [facility] CM Care Plan Problem One     Most [place] Problem One  (P) knowledge deficit of home care for COPD  Role Documenting the Problem One  (P) Care Management [place] for Problem One  (P) Active  THN Long Term Goal   (P) Over the next 60 days the patient will be able to verbilize understanding of home care for COPD  THN Long Term Goal Start Date  (P) 03/10/17  Interventions for Problem One Long Term Goal  (P) assess knowledge of home care for COPD, educate on COPD/treatment/zones, evaluate knowledge of COPD   THN CM Short Term Goal #1   (P) Over the next 30 days patient will verbalize and indicate knowledge of COPD home care  [facility] CM Short Term Goal #1 Start Date  (P) 03/10/17  Interventions for Short Term Goal #1  (P) assess knowledge of home care for COPD, educate on COPD/treatment/zones, evaluate knowledge of COPD     THN CM Care Plan Problem Two     Most Recent Value  Care Plan Problem Two  (P) Recurrent falls possibly related to medications, home safety  Role Documenting the Problem Two  (P) Care Management [place] for Problem Two  (P) Active  Interventions for Problem Two Long Term Goal   (P) assess home, pharmacy referral for medicine check, educate on medicines, review fall risks/precautions  THN Long Term Goal  (P) Over the next 45 days medications and home will be checked for fall prevention precautions  THN Long Term Goal Start Date  (P) 03/10/17  THN CM Short Term Goal #1   (P) Over the next 30 days patient medicines will be checked by [place] and she will be able to verbalize knowledge of fall risks prevention measures  THN CM Short Term Goal #1 Start  Date  (P) 03/10/17  Interventions for Short Term Goal #2   (P) assess home, pharmacy referral for medicine check, educate on medicines, review fall risks/precautions       Kimberly L. Lavina Hamman, RN, BSN, [place] Care Management [phone]

## 2017-04-22 ENCOUNTER — Other Ambulatory Visit: Payer: Self-pay | Admitting: *Deleted

## 2017-04-22 NOTE — Patient Outreach (Signed)
[facility]) Care Management  04/22/2017  PRANIKA FINKS 1951/05/06 [id number]   Care Coordination [facility]N CM called Mrs Salvador after collaboration with [facility] pharmacist to follow up  Left message for her to include [facility] CM mobile contact number for a return call  Plans Continue to make further attempts to reach Mrs Gunnoe In past [facility] CM and pharmacist have had difficulty in making contact with her Keep pcp updated Route note to pcp and Dr Simonne Come L. Lavina Hamman, RN, BSN, [place] Care Management [phone]

## 2017-04-26 ENCOUNTER — Other Ambulatory Visit: Payer: Self-pay | Admitting: *Deleted

## 2017-04-26 DIAGNOSIS — J449 Chronic obstructive pulmonary disease, unspecified: Secondary | ICD-10-CM | POA: Diagnosis not present

## 2017-04-26 NOTE — Patient Outreach (Signed)
[facility]) Care Management   04/26/2017  Mary [name] [birth year] [id number]  Mary [name] is an 66 y.o. female with a PMH of COPD now with home O2 as needed; chronic systolic HF (EF 46-27% in 2015), afib on Pradaxa; DM and anxiety presenting to the hospital on 02/18/17 with worsening SOB. She was treated with IV Lasix, steroids, and azithromycin. She confirmed in the hospital she still smokes occasional Cigarettes. Was admitted to SDU for COPD Exacerbation and placed on and weaned off of aBiPAP She was transferred to the medical floor on 02/21/17 HerHospitalization is complicated by uncontrolled hyperglycemia with steroid use. She was discharged on 02/24/17. Mary [name] has not been hospitalized cine 02/18/17 over 2 months ago.  This THN CM received areferral on 5/2/18from [name]., [facility]'s THNliaison, to engage for transition of care calls and evaluate for monthly home visits. Hospital dx COPD Exacerbation, Acute on chronic respiratory failure with hypoxia She was discharged on 02/24/17 home per hospital discharge summary with [facility] for HH[facility] and oxygen, pcp is Z Nevada Crane and Pulmonologist is E Hawkins for follow up care. Mary [name] agreed to a home visit on 03/10/17.  Mary [name] returned a call to [place] on 04/22/17. This is the third [facility] CM home visit for Mary [name]   Subjective:  "I'm doing good I have not had to use my oxygen yet" "I keep getting bruises" "Teach me how to work this machine"  Objective:   BP 110/60   Pulse (!) 125   Temp (!) 96.7 F (35.9 C)   Resp 20   SpO2 98%  Review of Systems  Constitutional: Negative for chills, diaphoresis, fever, malaise/fatigue and weight loss.  HENT: Negative for congestion, ear discharge, ear pain, hearing loss, nosebleeds, sinus pain, sore throat and tinnitus.   Eyes: Negative.  Negative for blurred vision, double vision, photophobia, pain, discharge and redness.  Respiratory: Positive for cough and wheezing.  Negative for hemoptysis, sputum production, shortness of breath and stridor.   Cardiovascular: Negative.  Negative for chest pain, palpitations, orthopnea, claudication, leg swelling and PND.  Gastrointestinal: Negative.  Negative for abdominal pain, blood in stool, constipation, diarrhea, heartburn, melena, nausea and vomiting.  Genitourinary: Negative.  Negative for dysuria, flank pain, frequency, hematuria and urgency.  Musculoskeletal: Negative.  Negative for back pain, falls, joint pain, myalgias and neck pain.  Skin: Negative for itching and rash.       Confirms dry skin and blood blisters when easily bruises arms   Neurological: Positive for dizziness and headaches. Negative for tingling, tremors, sensory change, speech change, focal weakness, seizures and loss of consciousness.       Headache cause from back, legs injury per Mary [name]  Endo/Heme/Allergies: Negative for environmental allergies and polydipsia. Bruises/bleeds easily.  Psychiatric/Behavioral: Negative.  Negative for depression, hallucinations, memory loss, substance abuse and suicidal ideas. The patient is not nervous/anxious and does not have insomnia.     Physical Exam  Constitutional: She is oriented to person, place, and time. She appears well-developed and well-nourished.  HENT:  Head: Normocephalic and atraumatic.  Right Ear: External ear normal.  Left Ear: External ear normal.  Eyes: Conjunctivae are normal. Pupils are equal, round, and reactive to light.  Cardiovascular: Normal rate, regular rhythm and intact distal pulses.   Respiratory: She has wheezes.  GI: Soft. Bowel sounds are normal.  Musculoskeletal: She exhibits edema.  Neurological: She is alert and oriented to person, place, and time.  Skin: Skin  is warm and dry.  Psychiatric: She has a normal mood and affect. Her behavior is normal. Judgment and thought content normal.    Encounter Medications:   Outpatient Encounter Prescriptions as of 04/26/2017   Medication Sig Note  . ACCU-CHEK AVIVA PLUS test strip USE TO TEST 3 TO 4 TIMES DAILY OR AS DIRECTED.   Marland Kitchen acetaminophen (TYLENOL) 500 MG tablet Take 500 mg by mouth every 6 (six) hours as needed for mild pain.   Marland Kitchen acetylcysteine (MUCOMYST) 20 % nebulizer solution Take 3 mLs by nebulization every 6 (six) hours. (Patient not taking: Reported on 03/10/2017)   . ALPRAZolam (XANAX) 1 MG tablet Take 1 mg by mouth 4 (four) times daily.    Marland Kitchen aspirin EC 81 MG tablet Take 81 mg by mouth daily.   Marland Kitchen atorvastatin (LIPITOR) 40 MG tablet TAKE ONE TABLET BY MOUTH DAILY.   . dabigatran (PRADAXA) 150 MG CAPS capsule Take 1 capsule (150 mg total) by mouth 2 (two) times daily.   Marland Kitchen DIGOX 125 MCG tablet TAKE ONE TABLET BY MOUTH ONCE DAILY.   . fluticasone (FLONASE) 50 MCG/ACT nasal spray Place 2 sprays into both nostrils daily. (Patient not taking: Reported on 04/20/2017) 03/18/2015: Patient states she currently takes as needed for seasonal allergies.  . furosemide (LASIX) 40 MG tablet TAKE ONE TABLET BY MOUTH ONCE DAILY. 03/10/2017: Pt states she only take prn if legs swell along with potassium to prevent cramps  . gabapentin (NEURONTIN) 100 MG capsule Take 1 capsule by mouth 3 (three) times daily.   Marland Kitchen glipiZIDE (GLUCOTROL XL) 2.5 MG 24 hr tablet 2.5 mg daily.    Marland Kitchen JANUVIA 100 MG tablet TAKE ONE TABLET BY MOUTH DAILY.   Marland Kitchen levalbuterol (XOPENEX) 0.63 MG/3ML nebulizer solution Take 3 mLs (0.63 mg total) by nebulization every 6 (six) hours.   . Magnesium Oxide 250 MG TABS Take 250 mg by mouth daily.    . metFORMIN (GLUCOPHAGE) 850 MG tablet TAKE 1 TABLET BY MOUTH TWICE DAILY WITH MEALS.   . metoprolol (LOPRESSOR) 50 MG tablet Take 2 tablets in the morning and 1 1/2 tablets at night   . montelukast (SINGULAIR) 10 MG tablet Take 1 tablet by mouth daily.   . Multiple Vitamin (MULTIVITAMIN) capsule Take 1 capsule by mouth daily.   . potassium chloride SA (K-DUR,KLOR-CON) 20 MEQ tablet Take 1 tablet (20 mEq total) by mouth  daily.   . sodium chloride (OCEAN) 0.65 % nasal spray Place 1 spray into the nose as needed for congestion. Congestion   . SYMBICORT 160-4.5 MCG/ACT inhaler INHALE 2 PUFFS INTO THE LUNGS TWICE DAILY. RINSE MOUTH AFTER USE.   . [DISCONTINUED] lisinopril (PRINIVIL,ZESTRIL) 2.5 MG tablet Take 1 tablet (2.5 mg total) by mouth daily.    No facility-administered encounter medications on file as of 04/26/2017.     Functional Status:   In your present state of health, do you have any difficulty performing the following activities: 03/10/2017 02/18/2017  Hearing? N N  Vision? N N  Difficulty concentrating or making decisions? N N  Walking or climbing stairs? Y N  Dressing or bathing? N N  Doing errands, shopping? N N  Preparing Food and eating ? N -  Using the Toilet? N -  In the past six months, have you accidently leaked urine? N -  Do you have problems with loss of bowel control? N -  Managing your Medications? N -  Managing your Finances? N -  Housekeeping or managing your Housekeeping? N -  Some recent data might be hidden    Fall/Depression Screening:    Fall Risk  03/10/2017 09/09/2015 03/18/2015  Falls in the past year? Yes No No  Number falls in past yr: 2 or more - -  Injury with Fall? No - -  Risk Factor Category  High Fall Risk - -  Risk for fall due to : History of fall(s);Impaired balance/gait;Medication side effect Impaired balance/gait Impaired balance/gait;Medication side effect  Risk for fall due to (comments): - - -  Follow up Education provided;Falls prevention discussed - -   PHQ 2/9 Scores 03/10/2017 09/09/2015 03/18/2015 02/28/2015 01/25/2015  PHQ - 2 Score 0 2 1 2  0  PHQ- 9 Score - 6 - 6 -    Assessment:   Mary [name] presents today on her couch in living room with her grand daughter and great grand children x 2 visiting.  At intervals Mary [name] is distracted and redirects great grand daughter.  She reports not having a car for transportation to appointments at this time and  depends on a female friend to take her to her appointments which she prefers  Mary [name] has received her pulse oximeter she was assisted to order online by her grand daughter, Museum/gallery conservator.  She and THN CM reviewed the use and purpose of the pulse oximeter in details.  Teach back method was used and she was able to appropriately verbalize how to use the pulse oximeter. THN CM encouraged use of oxygen for oxygen saturations lower than 88% and notifying pcp or pulmonologist. She reports she will start recording these values She was noted to have pulse values of 112-125 during the teach back episodes.  Tachycardia was discussed   Noted wheezing and edema of lower extremities today. THN CM re reviewed CHF zones and when it was important to contact pcp or CV providers.  Mary [name] reports if her ankles swell she takes extra diuretics as ordered.  THN CM discuss sodium intake, diuretics and fluid intake related to edema  Mary [name] continues to be noted to cough with audible and auscultated wheezing during visit She is able to tell [facility] CM her routine for taking her inhalers listed on medication list as ordered daily (Symbicort) and prn (albuterol).  She states she is taking her medications as ordered and reviewed how to take them with [facility] pharmacist, Lennette Bihari.  She prefers her pcp but informs THN CM she will contact Dr Luan Pulling if needed  Mary [name] reports continued bruising of extremities from dizziness and headaches. She discusses being on anticoagulants (ASA and pradaxa) that she feels increases her risk of bruising and bleeding.  CM discussed that Dr Claiborne Billings, CV may needed to be contacted to assist with possible adjustment or awareness of increased bruising.  She denies any falls since the last [facility] CM home visit.  States she sustained a bruise to her arm when putting up dishes recently.    Mary [name] and [facility] CM contacted [place] health care to confirm she has 40 credits per quarter available for her OTC/health benefit products  via [employer] care coverage.  A Catalog was requested to be sent to her home to assisted with further orders for DME and OTC products. Mary Bible voiced a concern with calls from males stating they are from united [employer] care requesting meeting with her at her home for annual health screening.  She denied need for the home visit and can not recall being visited in the past   Mary [name]  confirms collaboration with [facility] pharmacist to review her medications for assistance and side effects.  Reports she also receives regular assistance from a [place] representative that offers her discounts as needed (last contact was on 04/20/17 from [place])  [facility] CM discussed a standard routine and rationales for her monitoring of each vital sign emphasizing the need to check her BP to prevent hypotension episodes that may lead to falls. Mary [name] will be contacted for follow up services in 3 -4 weeks as she agreed  Routed note to care team members EPIC in basket message to Dr Claiborne Billings -concern for anticoagulant/increased bruising.   THN CM Care Plan Problem One     Most Recent Value  Care Plan Problem One  knowledge deficit of home care for COPD  Role Documenting the Problem One  Care Management Coordinator  Care Plan for Problem One  Active  THN Long Term Goal   Over the next 60 days the patient will be able to verbilize understanding of home care for COPD  THN Long Term Goal Start Date  03/10/17  [facility] Long Term Goal Met Date  04/26/17  Interventions for Problem One Long Term Goal  assess knowledge of home care for COPD, educate on COPD/treatment/zones, evaluate knowledge of COPD   THN CM Short Term Goal #1   Over the next 30 days patient will verbalize and indicate knowledge of COPD home care  [facility] CM Short Term Goal #1 Start Date  03/10/17  [facility] CM Short Term Goal #1 Met Date  03/24/17  Interventions for Short Term Goal #1  assess knowledge of home care for COPD, educate on  COPD/treatment/zones, evaluate knowledge of COPD     THN CM Care Plan Problem Two     Most Recent Value  Care Plan Problem Two  Recurrent falls possibly related to medications, home safety  Role Documenting the Problem Two  Care Management [place] for Problem Two  Active  Interventions for Problem Two Long Term Goal   assess home, pharmacy referral for medicine check, educate on medicines, review fall risks/precautions  THN Long Term Goal  Over the next 45 days medications and home will be checked for fall prevention precautions  THN Long Term Goal Start Date  03/10/17  [facility] Long Term Goal Met Date  04/27/17  THN CM Short Term Goal #1   Over the next 30 days patient medicines will be checked by [place] and she will be able to verbalize knowledge of fall risks prevention measures  THN CM Short Term Goal #1 Start Date  03/10/17  [facility] CM Short Term Goal #1 Met Date   04/22/17  Interventions for Short Term Goal #2   assess home, pharmacy referral for medicine check, educate on medicines, review fall risks/precautions       Christiona Siddique L. Lavina Hamman, RN, BSN, [place] Care Management [phone]

## 2017-04-27 ENCOUNTER — Other Ambulatory Visit: Payer: Self-pay | Admitting: Cardiovascular Disease

## 2017-05-07 ENCOUNTER — Telehealth: Payer: Self-pay | Admitting: *Deleted

## 2017-05-07 NOTE — Patient Outreach (Signed)
[place] [facility]) Care Management  05/07/2017  Mary [name] July 06, 1951 [id number]  Care coordination  [facility] CM called Dr Evette Georges office 05/07/17 after no response from 04/27/17 message sent in [facility] in basket about [name] concern about blood thinner administration and increase bruising The office per a voice message is closed for a meeting on 05/07/17.  THN CM left a voice message with [name] concern including THN CM mobile number and [name] home number.  Plans THN CM will continue to attempt to reach Dr Kelly/ staff about [name] concern.  THN CM to route notes to Dr Claiborne Billings and send another EPIC in [employer] L. Lavina Hamman, RN, BSN, [place] Care Management [phone]

## 2017-05-14 ENCOUNTER — Other Ambulatory Visit (HOSPITAL_COMMUNITY): Payer: Self-pay | Admitting: Internal Medicine

## 2017-05-14 ENCOUNTER — Ambulatory Visit (HOSPITAL_COMMUNITY)
Admission: RE | Admit: 2017-05-14 | Discharge: 2017-05-14 | Disposition: A | Discharge status: Home / Self Care | Payer: Medicare Other | Source: Ambulatory Visit | Attending: Internal Medicine | Admitting: Internal Medicine

## 2017-05-14 DIAGNOSIS — M25551 Pain in right hip: Secondary | ICD-10-CM | POA: Diagnosis not present

## 2017-05-14 DIAGNOSIS — R102 Pelvic and perineal pain: Secondary | ICD-10-CM | POA: Diagnosis not present

## 2017-05-14 DIAGNOSIS — M25552 Pain in left hip: Secondary | ICD-10-CM | POA: Diagnosis not present

## 2017-05-14 DIAGNOSIS — Z9181 History of falling: Secondary | ICD-10-CM | POA: Insufficient documentation

## 2017-05-14 DIAGNOSIS — M47816 Spondylosis without myelopathy or radiculopathy, lumbar region: Secondary | ICD-10-CM | POA: Insufficient documentation

## 2017-05-14 DIAGNOSIS — W19XXXA Unspecified fall, initial encounter: Secondary | ICD-10-CM

## 2017-05-14 DIAGNOSIS — M25559 Pain in unspecified hip: Secondary | ICD-10-CM | POA: Diagnosis not present

## 2017-05-17 ENCOUNTER — Other Ambulatory Visit: Payer: Self-pay | Admitting: *Deleted

## 2017-05-17 NOTE — Patient Outreach (Signed)
[place] [facility]) Care Management   05/17/2017  Mary [name] 01/09/1951 [id number]  Mary [name] is an 66 y.o. female with a PMH of COPD now withhome O2 as needed; chronic systolic HF (EF 63-78% in 2015), Afib on Pradaxa; DM and anxiety presenting to the hospital on 02/18/17 with worsening SOB. She was treated with IV Lasix, steroids, and azithromycin. She confirmed in the hospital she still smokes occasional Cigarettes. Was admitted to SDU for COPD Exacerbation and placed on and weaned off of aBiPAP She was transferred to the medical floor on 02/21/17 HerHospitalization is complicated by uncontrolled hyperglycemia with steroid use. She was discharged on 02/24/17. Mary [name] has not been hospitalized cine 02/18/17 over 2 months ago.  This THN CM received areferral on 5/2/18from [name]., [facility]'s THNliaison, to engage for transition of care calls and evaluate for monthly home visits. Hospital dx COPD Exacerbation, Acute on chronic respiratory failure with hypoxia She was discharged on 02/24/17 home per hospital discharge summary with [facility] for HHPT and oxygen, pcp is Z [place] Crane and Pulmonologist is E Hawkins for follow up care. Mary [name] to a home visit on 03/10/17.  Mary [name] returned a call to [place] on 04/22/17. This is the fourth [facility] CM home visit for Mary Chain   Subjective: "I am doing better"  Objective:    ROS  Physical Exam  Encounter Medications:   Outpatient Encounter Prescriptions as of 05/17/2017  Medication Sig Note  . ACCU-CHEK AVIVA PLUS test strip USE TO TEST 3 TO 4 TIMES DAILY OR AS DIRECTED.   Marland Kitchen acetaminophen (TYLENOL) 500 MG tablet Take 500 mg by mouth every 6 (six) hours as needed for mild pain.   Marland Kitchen acetylcysteine (MUCOMYST) 20 % nebulizer solution Take 3 mLs by nebulization every 6 (six) hours.   . ALPRAZolam (XANAX) 1 MG tablet Take 1 mg by mouth 4 (four) times daily.    Marland Kitchen aspirin EC 81 MG tablet Take 81 mg by mouth daily.   Marland Kitchen  atorvastatin (LIPITOR) 40 MG tablet TAKE ONE TABLET BY MOUTH DAILY.   Marland Kitchen DIGOX 125 MCG tablet TAKE ONE TABLET BY MOUTH ONCE DAILY.   . fluticasone (FLONASE) 50 MCG/ACT nasal spray Place 2 sprays into both nostrils daily. 03/18/2015: Patient states she currently takes as needed for seasonal allergies.  . furosemide (LASIX) 40 MG tablet TAKE ONE TABLET BY MOUTH ONCE DAILY. 03/10/2017: Pt states she only take prn if legs swell along with potassium to prevent cramps  . gabapentin (NEURONTIN) 100 MG capsule Take 1 capsule by mouth 3 (three) times daily.   Marland Kitchen glipiZIDE (GLUCOTROL XL) 2.5 MG 24 hr tablet 2.5 mg daily.    Marland Kitchen JANUVIA 100 MG tablet TAKE ONE TABLET BY MOUTH DAILY.   Marland Kitchen levalbuterol (XOPENEX) 0.63 MG/3ML nebulizer solution Take 3 mLs (0.63 mg total) by nebulization every 6 (six) hours.   Marland Kitchen lisinopril (PRINIVIL,ZESTRIL) 2.5 MG tablet TAKE ONE TABLET BY MOUTH DAILY.   . Magnesium Oxide 250 MG TABS Take 250 mg by mouth daily.    . metFORMIN (GLUCOPHAGE) 850 MG tablet TAKE 1 TABLET BY MOUTH TWICE DAILY WITH MEALS.   . metoprolol (LOPRESSOR) 50 MG tablet Take 2 tablets in the morning and 1 1/2 tablets at night   . montelukast (SINGULAIR) 10 MG tablet Take 1 tablet by mouth daily.   . Multiple Vitamin (MULTIVITAMIN) capsule Take 1 capsule by mouth daily.   . potassium chloride SA (K-DUR,KLOR-CON) 20 MEQ tablet Take 1  tablet (20 mEq total) by mouth daily.   . sodium chloride (OCEAN) 0.65 % nasal spray Place 1 spray into the nose as needed for congestion. Congestion   . SYMBICORT 160-4.5 MCG/ACT inhaler INHALE 2 PUFFS INTO THE LUNGS TWICE DAILY. RINSE MOUTH AFTER USE.   . [DISCONTINUED] dabigatran (PRADAXA) 150 MG CAPS capsule Take 1 capsule (150 mg total) by mouth 2 (two) times daily.    No facility-administered encounter medications on file as of 05/17/2017.     Functional Status:   In your present state of health, do you have any difficulty performing the following activities: 03/10/2017 02/18/2017   Hearing? N N  Vision? N N  Difficulty concentrating or making decisions? N N  Walking or climbing stairs? Y N  Dressing or bathing? N N  Doing errands, shopping? N N  Preparing Food and eating ? N -  Using the Toilet? N -  In the past six months, have you accidently leaked urine? N -  Do you have problems with loss of bowel control? N -  Managing your Medications? N -  Managing your Finances? N -  Housekeeping or managing your Housekeeping? N -  Some recent data might be hidden    Fall/Depression Screening:    Fall Risk  05/17/2017 03/10/2017 09/09/2015  Falls in the past year? Yes Yes No  Number falls in past yr: 2 or more 2 or more -  Injury with Fall? Yes No -  Risk Factor Category  High Fall Risk High Fall Risk -  Risk for fall due to : History of fall(s);Impaired balance/gait;Medication side effect History of fall(s);Impaired balance/gait;Medication side effect Impaired balance/gait  Risk for fall due to: Comment - - -  Follow up Education provided;Falls evaluation completed;Follow up appointment;Falls prevention discussed Education provided;Falls prevention discussed -   PHQ 2/9 Scores 05/17/2017 03/10/2017 09/09/2015 03/18/2015 02/28/2015 01/25/2015  PHQ - 2 Score 0 0 2 1 2  0  PHQ- 9 Score - - 6 - 6 -    Assessment:    Mary [name] states she fell on last Monday or Tuesday in tub with residual left side pain She went to the doctor. Pain continues now in her left hip to buttocks Her Friend dorothy from [employer] took her to Dr Nevada Crane and she was sent to [facility] for xray DME includes: oxygen, nebulizer, cane and walker DM bracelet  Reviewed concerns with Mary Mondesir Hx partial hysterectomy pain in her pelvic area  Discussed muscle pain after trauma  Gave permission for [facility] CM to stop by office to check on dosage and management of Pradaxa THN CM reviewed EPIC to update Mary Minami that it lists her next cv appt 07/29/17 at 1400  Used emu and salonpas patches for pain   Recommend always take  cell with her during baths,etc   Plan:  To follow up with Mary Vera in 2 weeks and to reach out to her CV provider related Pradaxa dosage, and monitoring Route to providers [facility] CM Care Plan Problem One     Most Recent Value  Care Plan Problem One  knowledge deficit of home care for COPD  Role Documenting the Problem One  Care Management [place] for Problem One  Active  THN Long Term Goal   Over the next 60 days the patient will be able to verbilize understanding of home care for COPD  [facility] Long Term Goal Start Date  03/10/17  [facility] Long Term Goal Met Date  04/26/17  Interventions for Problem One Long Term Goal  assess knowledge of home care for COPD, educate on COPD/treatment/zones, evaluate knowledge of COPD   THN CM Short Term Goal #1   Over the next 30 days patient will verbalize and indicate knowledge of COPD home care  [facility] CM Short Term Goal #1 Start Date  03/10/17  [facility] CM Short Term Goal #1 Met Date  03/24/17  Interventions for Short Term Goal #1  assess knowledge of home care for COPD, educate on COPD/treatment/zones, evaluate knowledge of COPD     THN CM Care Plan Problem Two     Most Recent Value  Care Plan Problem Two  Recurrent falls possibly related to medications, home safety  Role Documenting the Problem Two  Care Management [place] for Problem Two  Active  Interventions for Problem Two Long Term Goal   assess home, pharmacy referral for medicine check, educate on medicines, review fall risks/precautions  THN Long Term Goal  Over the next 45 days medications and home will be checked for fall prevention precautions  THN Long Term Goal Start Date  03/10/17  [facility] Long Term Goal Met Date  04/27/17  THN CM Short Term Goal #1   Over the next 30 days patient medicines will be checked by [place] and she will be able to verbalize knowledge of fall risks prevention measures  THN CM Short Term Goal #1 Start Date  03/10/17  [facility] CM Short Term Goal #1 Met  Date   04/22/17  Interventions for Short Term Goal #2   assess home, pharmacy referral for medicine check, educate on medicines, review fall risks/precautions        Tadd Holtmeyer L. Lavina Hamman, RN, BSN, [place] Care Management [phone]

## 2017-05-26 ENCOUNTER — Other Ambulatory Visit: Payer: Self-pay | Admitting: Cardiovascular Disease

## 2017-05-27 DIAGNOSIS — J449 Chronic obstructive pulmonary disease, unspecified: Secondary | ICD-10-CM | POA: Diagnosis not present

## 2017-05-31 ENCOUNTER — Other Ambulatory Visit: Payer: Self-pay | Admitting: *Deleted

## 2017-05-31 ENCOUNTER — Telehealth: Payer: Self-pay | Admitting: Cardiovascular Disease

## 2017-05-31 NOTE — Patient Outreach (Signed)
Mary [name]) Care Management   05/31/2017  Mary [name] February 08, 1951 [id number]  Mary [name] is an 66 y.o. female with a PMH of COPD now with home O2 as needed; chronic systolic HF (EF 97-41% in 2015), Afib on Pradaxa; DM and anxiety presenting to the [name] on 02/18/17 with worsening SOB. She was treated with IV Lasix, steroids, and azithromycin. She confirmed in the [name] she still smokes occasional Cigarettes. Was admitted to SDU for COPD Exacerbation and placed on and weaned off of aBiPAP She was transferred to the medical floor on 02/21/17 HerHospitalization is complicated by uncontrolled hyperglycemia with steroid use. She was discharged on 02/24/17. Mary [name] has not been hospitalized cine 02/18/17 over 2 months ago.  This THN CM received areferral on 5/2/18from Mary [name]., [place] [name] THNliaison, to engage for transition of care calls and evaluate for monthly home visits. [name] dx COPD Exacerbation, Acute on chronic respiratory failure with hypoxia She was discharged on 02/24/17 home per [name] discharge summary with [facility] and [facility], pcp is Mary [name] and Pulmonologist is Mary [name] for follow up care. Mary [name] agreed to a home visit on 03/10/17.  Mary [name] returned a call to Mary [name] on 04/22/17. This is the fifth THN CM home visit for Mary [name]   Subjective: "my neck hurts but feels better after I've been up and moving"  Objective:   BP 110/68   Pulse 71   Temp (!) 97.1 F (36.2 C) (Oral)   Resp 20   SpO2 93%   Review of Systems  Constitutional: Negative for chills, diaphoresis, fever, malaise/fatigue and weight loss.  HENT: Positive for congestion. Negative for ear discharge, ear pain, hearing loss, nosebleeds, sinus pain and tinnitus.        Congestion and white phlegm generally only noted upon waking  Denies yellow or green sputum  Eyes: Negative for blurred vision, double vision, photophobia, pain, discharge and redness.   Respiratory: Negative for cough, hemoptysis, sputum production, shortness of breath and wheezing.   Cardiovascular: Positive for leg swelling. Negative for chest pain, palpitations, orthopnea, claudication and PND.       At intervals states she takes "a fluid pill for about five days with a banana"  Gastrointestinal: Positive for constipation. Negative for abdominal pain, blood in stool, diarrhea, heartburn, melena, nausea and vomiting.       Constipation with use of "vitamin"  Genitourinary: Negative for dysuria, flank pain, frequency, hematuria and urgency.  Musculoskeletal: Positive for joint pain and neck pain. Negative for back pain, falls and myalgias.       Pain in shoulders more on left shoulder than right shoulder   Skin: Positive for itching and rash.       Rash noted more on right shoulder   Neurological: Positive for tingling and weakness. Negative for tremors, sensory change, speech change, focal weakness, seizures, loss of consciousness and headaches.       Burning tingling of feet cold fingers and toes   Endo/Heme/Allergies: Negative for environmental allergies and polydipsia. Does not bruise/bleed easily.  Psychiatric/Behavioral: Negative for depression, hallucinations, memory loss, substance abuse and suicidal ideas. The patient is not nervous/anxious and does not have insomnia.     Physical Exam  Constitutional: She is oriented to person, place, and time. She appears well-developed and well-nourished. She is cooperative.  HENT:  Head: Normocephalic and atraumatic.  Denies ear pain or drainage  Eyes: Pupils are equal, round, and reactive to light. Conjunctivae  are normal.  Neck: Muscular tenderness present. Decreased range of motion present. No Brudzinski's sign and no Kernig's sign noted.  Left side of neck and shoulder warm and painful to touch   Cardiovascular: Normal rate, normal heart sounds and intact distal pulses.   Respiratory: Effort normal and breath sounds  normal.  GI: Soft. Bowel sounds are normal.  Musculoskeletal:       Left shoulder: She exhibits decreased range of motion and tenderness.  Lymphadenopathy:       Head (right side): No occipital adenopathy present.       Head (left side): No occipital adenopathy present.  Neurological: She is alert and oriented to person, place, and time.  Skin: Skin is warm and dry. Rash noted.  Psychiatric: She has a normal mood and affect. Her speech is normal and behavior is normal. Judgment and thought content normal.    Encounter Medications:   Outpatient Encounter Prescriptions as of 05/31/2017  Medication Sig Note  . ACCU-CHEK AVIVA PLUS test strip USE TO TEST 3 TO 4 TIMES DAILY OR AS DIRECTED.   Marland Kitchen acetaminophen (TYLENOL) 500 MG tablet Take 500 mg by mouth every 6 (six) hours as needed for mild pain.   Marland Kitchen acetylcysteine (MUCOMYST) 20 % nebulizer solution Take 3 mLs by nebulization every 6 (six) hours. (Patient not taking: Reported on 03/10/2017)   . ALPRAZolam (XANAX) 1 MG tablet Take 1 mg by mouth 4 (four) times daily.    Marland Kitchen aspirin EC 81 MG tablet Take 81 mg by mouth daily.   Marland Kitchen atorvastatin (LIPITOR) 40 MG tablet TAKE ONE TABLET BY MOUTH DAILY.   Marland Kitchen DIGOX 125 MCG tablet TAKE ONE TABLET BY MOUTH ONCE DAILY.   . fluticasone (FLONASE) 50 MCG/ACT nasal spray Place 2 sprays into both nostrils daily. (Patient not taking: Reported on 04/20/2017) 03/18/2015: Patient states she currently takes as needed for seasonal allergies.  . furosemide (LASIX) 40 MG tablet TAKE ONE TABLET BY MOUTH ONCE DAILY. 03/10/2017: Pt states she only take prn if legs swell along with potassium to prevent cramps  . gabapentin (NEURONTIN) 100 MG capsule Take 1 capsule by mouth 3 (three) times daily.   Marland Kitchen glipiZIDE (GLUCOTROL XL) 2.5 MG 24 hr tablet 2.5 mg daily.    Marland Kitchen JANUVIA 100 MG tablet TAKE ONE TABLET BY MOUTH DAILY.   Marland Kitchen levalbuterol (XOPENEX) 0.63 MG/3ML nebulizer solution Take 3 mLs (0.63 mg total) by nebulization every 6 (six) hours.    Marland Kitchen lisinopril (PRINIVIL,ZESTRIL) 2.5 MG tablet TAKE ONE TABLET BY MOUTH DAILY.   . Magnesium Oxide 250 MG TABS Take 250 mg by mouth daily.    . metFORMIN (GLUCOPHAGE) 850 MG tablet TAKE 1 TABLET BY MOUTH TWICE DAILY WITH MEALS.   . metoprolol (LOPRESSOR) 50 MG tablet Take 2 tablets in the morning and 1 1/2 tablets at night   . montelukast (SINGULAIR) 10 MG tablet Take 1 tablet by mouth daily.   . Multiple Vitamin (MULTIVITAMIN) capsule Take 1 capsule by mouth daily.   . potassium chloride SA (K-DUR,KLOR-CON) 20 MEQ tablet Take 1 tablet (20 mEq total) by mouth daily.   Marland Kitchen PRADAXA 150 MG CAPS capsule TAKE ONE CAPSULE BY MOUTH TWICE DAILY.   . sodium chloride (OCEAN) 0.65 % nasal spray Place 1 spray into the nose as needed for congestion. Congestion   . SYMBICORT 160-4.5 MCG/ACT inhaler INHALE 2 PUFFS INTO THE LUNGS TWICE DAILY. RINSE MOUTH AFTER USE.    No facility-administered encounter medications on file as of 05/31/2017.  Functional Status:   In your present state of health, do you have any difficulty performing the following activities: 03/10/2017 02/18/2017  Hearing? N N  Vision? N N  Difficulty concentrating or making decisions? N N  Walking or climbing stairs? Y N  Dressing or bathing? N N  Doing errands, shopping? N N  Preparing Food and eating ? N -  Using the Toilet? N -  In the past six months, have you accidently leaked urine? N -  Do you have problems with loss of bowel control? N -  Managing your Medications? N -  Managing your Finances? N -  Housekeeping or managing your Housekeeping? N -  Some recent data might be hidden    Fall/Depression Screening:    Fall Risk  03/10/2017 09/09/2015 03/18/2015  Falls in the past year? Yes No No  Number falls in past yr: 2 or more - -  Injury with Fall? No - -  Risk Factor Category  High Fall Risk - -  Risk for fall due to : History of fall(s);Impaired balance/gait;Medication side effect Impaired balance/gait Impaired  balance/gait;Medication side effect  Risk for fall due to: Comment - - -  Follow up Education provided;Falls prevention discussed - -   PHQ 2/9 Scores 03/10/2017 09/09/2015 03/18/2015 02/28/2015 01/25/2015  PHQ - 2 Score 0 2 1 2  0  PHQ- 9 Score - 6 - 6 -    Assessment:   Mary Oehler presents today at home in her living room with her grand daughter present  [facility] CM reviewed with Mary Reffitt the contact with Eliezer Lofts, CV RN and pending contact from CV pharmacist   During today's home visit Mary Mcraney main concern is a rash (various red raised bumps) on her shoulders but more on her right shoulder and upper arm, not sleeping well and left neck pain with difficulty turning her head since the evening of 05/30/17.  She has been applying benadryl and emu cream to the area.  THN CM assess for symptoms of meningitis, sinusitis and lymph node issues. Negative for meningitis, sinusitis and lymph node symptoms. Denies sinus and lymph node pain.  Mary Dubinsky confirms an [place] health care RN visited her on 05/27/17 in her home for a complete assessment and left forms she is to carry to dr Juel Burrow office related to a mammogram, eye exam and a guaiac stool specimen to be sent in the mail Mary Caissie confirms she has not sent the guaiac specimen mail yet related to "constipation caused by my vitamin". THN CM answered questions about how to obtain and send back her specimen.    Diabetes- Mary Dehner states her cbg ranges from 126-152 in the last week. She is now keeping a more accurate record of these values in a note book THN CM demonstrated how to obtain her cbg averages and Mary Easterly re demonstrated the task  COPD  Confirms she is now using her oxygen at intervals during the day as ordered.  THN CM encouraged use during night also to assist with better sleep and more energy during the daytime. Mary Kentner today discusses the possibility she may be getting intolerable to her scented candles.  THN CM has discussed the strong odor of the  candles previously.  THN CM again spoke to her about the scented candles as a possible trigger for respiratory symptoms.  When [name] CM inquired about her nebulizer and bipap/cpap Mary Paske tells CM she "scolds" her nebulizer container and mask and states  Plan: [place] [name] CM spoke with Eliezer Lofts CV RN prior to Mary Cullen's home visit to confirm last seen in late March 2018 next in October 2018 and on Pradxa so no lab work to check blood Will have CV pharmacy check to see if something else can be on an call Mary Koebel  [name] CM made contact with PCP office while at Mary Insalaco home to report the concerns with a rash (various red raised bumps) on her shoulders but more on her right shoulder and upper arm, not sleeping well and left neck pain with difficulty turning her head since the evening of 05/30/17. THN CM Care Plan Problem One     Most Recent Value  Care Plan Problem One  (P) knowledge deficit of home care for COPD  Role Documenting the Problem One  (P) Care Management [place] for Problem One  (P) Active  THN Long Term Goal   (P) Over the next 60 days the patient will be able to verbilize understanding of home care for COPD  THN Long Term Goal Start Date  (P) 03/10/17  THN Long Term Goal Met Date  (P) 04/26/17  Interventions for Problem One Long Term Goal  (P) assess knowledge of home care for COPD, educate on COPD/treatment/zones, evaluate knowledge of COPD   THN CM Short Term Goal #1   (P) Over the next 30 days patient will verbalize and indicate knowledge of COPD home care  [facility] Fall CM Short Term Goal #1 Start Date  (P) 03/10/17  THN CM Short Term Goal #1 Met Date  (P) 03/24/17  Interventions for Short Term Goal #1  (P) assess knowledge of home care for COPD, educate on COPD/treatment/zones, evaluate knowledge of COPD     THN CM Care Plan Problem Two     Most Recent Value  Care Plan Problem Two  (P) Recurrent falls possibly related to medications, home safety  Role Documenting the Problem Two  (P)  Care Management [place] for Problem Two  (P) Active  Interventions for Problem Two Long Term Goal   (P) assess home, pharmacy referral for medicine check, educate on medicines, review fall risks/precautions  THN Long Term Goal  (P) Over the next 45 days medications and home will be checked for fall prevention precautions  THN Long Term Goal Start Date  (P) 03/10/17  THN Long Term Goal Met Date  (P) 04/27/17  THN CM Short Term Goal #1   (P) Over the next 30 days patient medicines will be checked by [place] and she will be able to verbalize knowledge of fall risks prevention measures  THN CM Short Term Goal #1 Start Date  (P) 03/10/17  THN CM Short Term Goal #1 Met Date   (P) 04/22/17  Interventions for Short Term Goal #2   (P) assess home, pharmacy referral for medicine check, educate on medicines, review fall risks/precautions      Dariya Gainer L. Lavina Hamman, RN, BSN, [place] Care Management [phone]

## 2017-05-31 NOTE — Telephone Encounter (Signed)
Lavina Hamman (case manager from [facility]) calling on behalf of patient and requests to speak with nurse.

## 2017-05-31 NOTE — Telephone Encounter (Addendum)
Spoke with Joellyn Quails RN case manager with [facility] Patient has reported increasing falls and bruising Patient told [name], RN that she was concerned about her INR checks Advised Kim that patient is now on Pradaxa Informed her that Pradaxa cannot be held d/t AF and that patient will be more prone to bruising given that she is on an anticoagulant and will seek recommendations from pharmacy staff   Patient also told [name], RN that she was to f/up with Dr. Claiborne Billings 3 months from her last appt but did not get a notice that her appt was a later date. Explained that she last saw MD 3/28 and was to f/up 6 months after - scheduled to see MD 10/4

## 2017-06-01 ENCOUNTER — Other Ambulatory Visit: Payer: Self-pay | Admitting: *Deleted

## 2017-06-01 NOTE — Patient Outreach (Addendum)
[facility]) Care Management  06/01/2017  EBELYN BOHNET 29-Dec-1950 [id number]  Care coordination  [facility] CM received response to contact made to Dr Nevada Crane office to notify of Mrs Cipriani stiff neck (more on left side) and rash on both shoulders Response from Dr Nevada Crane office was for Mrs Dapper to call to make an appointment to be assessed [place], pharmacist of Dr Corky Downs CV office called [facility] CM to inquire about voiced concern with increased bruising, use of pradaxa [facility] CM was informed No routine lab checks for pradaxa, Pradaxa for atrial fibrillation and Dr Claiborne Billings will be consulted [facility] CM encouraged contact with Mrs Izzo, discussed bruises or increased bleeding per Mrs Alvis 1-2 times a month with last bruising note 304 weeks ago during home visit (not during 05/31/17 home visit) [facility] CM called and updated Mrs Ricotta of contact from Dr Nevada Crane and [facility] offices Discussed her making an appointment with Dr Nevada Crane for further assessment and call from [place] plus questions answered  Return call again from [place] after consulting Dr Claiborne Billings- No wish to make changes to Pradaxa, inquired about orthostatic hypotension and transportation to get to appointment in October 2018.  THN CM discussed generally Mrs Cecere with low BP and complaints of dizziness. Discussed next home visit in 3-4 weeks  Plans to continue to follow Mrs Meeuwsen and next home visit scheduled in 3-4 weeks  Nialah Saravia L. Lavina Hamman, RN, BSN, [place] Care Management [phone]

## 2017-06-01 NOTE — Telephone Encounter (Signed)
Case discussed with Dr Claiborne Billings. Patient at high risk of stoke due to complex cardiac history and chronic AFib.   Please continue Pradaxa therapy without changes for now and continue close monitoring. MD will re-assess during next F/U appointment RN Lavina Hamman received instuctions to notify clinic if drop in BP noted or increase frequency of falls.  Multiple attempts to reach patient made.  Unable to reach patient or leave message.

## 2017-06-07 DIAGNOSIS — M25559 Pain in unspecified hip: Secondary | ICD-10-CM | POA: Diagnosis not present

## 2017-06-07 DIAGNOSIS — R05 Cough: Secondary | ICD-10-CM | POA: Diagnosis not present

## 2017-06-07 DIAGNOSIS — R52 Pain, unspecified: Secondary | ICD-10-CM | POA: Diagnosis not present

## 2017-06-07 DIAGNOSIS — R296 Repeated falls: Secondary | ICD-10-CM | POA: Diagnosis not present

## 2017-06-15 DIAGNOSIS — I959 Hypotension, unspecified: Secondary | ICD-10-CM | POA: Diagnosis not present

## 2017-06-15 DIAGNOSIS — D509 Iron deficiency anemia, unspecified: Secondary | ICD-10-CM | POA: Diagnosis not present

## 2017-06-15 DIAGNOSIS — E1140 Type 2 diabetes mellitus with diabetic neuropathy, unspecified: Secondary | ICD-10-CM | POA: Diagnosis not present

## 2017-06-17 DIAGNOSIS — D509 Iron deficiency anemia, unspecified: Secondary | ICD-10-CM | POA: Diagnosis not present

## 2017-06-17 DIAGNOSIS — I482 Chronic atrial fibrillation: Secondary | ICD-10-CM | POA: Diagnosis not present

## 2017-06-17 DIAGNOSIS — J449 Chronic obstructive pulmonary disease, unspecified: Secondary | ICD-10-CM | POA: Diagnosis not present

## 2017-06-17 DIAGNOSIS — I2510 Atherosclerotic heart disease of native coronary artery without angina pectoris: Secondary | ICD-10-CM | POA: Diagnosis not present

## 2017-06-17 DIAGNOSIS — E1140 Type 2 diabetes mellitus with diabetic neuropathy, unspecified: Secondary | ICD-10-CM | POA: Diagnosis not present

## 2017-06-27 DIAGNOSIS — J449 Chronic obstructive pulmonary disease, unspecified: Secondary | ICD-10-CM | POA: Diagnosis not present

## 2017-06-29 ENCOUNTER — Other Ambulatory Visit: Payer: Self-pay | Admitting: *Deleted

## 2017-06-29 NOTE — Patient Outreach (Signed)
Mary [name]) Care Management   06/29/2017  Mary [name] Jul 02, 1951 [id number]  Mary [name] is an 66 y.o. female with a PMH of COPD; chronic systolic HF (EF 40-10% in 2015), Afib on Pradaxa; DM and anxiety presenting to the hospital on 02/18/17 with worsening SOB. She confirmed in the hospital she still smokes occasional Cigarettes.  She was discharged on 02/24/17. Mary [name] has not been hospitalized since 02/18/17 over 4 months ago.  This THN CM received areferral on 5/2/18from [name]., [facility]'s THNliaison, to engage for transition of care calls and evaluate for monthly home visits. Hospital dx COPD Exacerbation, Acute on chronic respiratory failure with hypoxia She was discharged on 02/24/17 Mary [name] to a home visit on 03/10/17.   Subjective: " I'm not doing okay. I can bearly walk and I hurt from my hips all the way down"  Objective:   BP 118/70   Pulse 83   Temp (!) 97.1 F (36.2 C) (Oral)   Resp 20   Ht 1.702 m (5' 7" )   Wt 118 lb (53.5 kg)   SpO2 94%   BMI 18.48 kg/m  Review of Systems  Constitutional: Negative for chills, diaphoresis, fever, malaise/fatigue and weight loss.  HENT: Negative for congestion, ear discharge, ear pain, hearing loss, nosebleeds, sinus pain, sore throat and tinnitus.   Eyes: Negative.  Negative for blurred vision, double vision, photophobia, pain, discharge and redness.  Respiratory: Positive for cough and shortness of breath. Negative for sputum production, wheezing and stridor.   Cardiovascular: Negative.  Negative for chest pain, palpitations, orthopnea, claudication, leg swelling and PND.  Gastrointestinal: Negative.  Negative for abdominal pain, blood in stool, constipation, diarrhea, heartburn, melena, nausea and vomiting.  Genitourinary: Negative.  Negative for dysuria, flank pain, frequency, hematuria and urgency.  Musculoskeletal: Positive for joint pain and myalgias. Negative for back pain, falls and neck  pain.  Skin: Positive for rash. Negative for itching.  Neurological: Positive for dizziness and weakness. Negative for tingling, tremors, sensory change, speech change, focal weakness, seizures, loss of consciousness and headaches.  Endo/Heme/Allergies: Negative for environmental allergies and polydipsia. Bruises/bleeds easily.  Psychiatric/Behavioral: Negative for depression, hallucinations, memory loss, substance abuse and suicidal ideas. The patient is not nervous/anxious and does not have insomnia.     Physical Exam  Constitutional: She is oriented to person, place, and time. She appears well-developed and well-nourished.  HENT:  Head: Normocephalic and atraumatic.  Neck: Normal range of motion.  Cardiovascular: Normal rate, regular rhythm and normal heart sounds.   Respiratory: Effort normal and breath sounds normal.  GI: Soft. Bowel sounds are normal.  Musculoskeletal: Normal range of motion.  Neurological: She is alert and oriented to person, place, and time.  Skin: Skin is warm and dry. Rash noted.  Psychiatric: She has a normal mood and affect. Her behavior is normal. Judgment and thought content normal.    Encounter Medications:   Outpatient Encounter Prescriptions as of 06/29/2017  Medication Sig Note  . ACCU-CHEK AVIVA PLUS test strip USE TO TEST 3 TO 4 TIMES DAILY OR AS DIRECTED.   Marland Kitchen acetaminophen (TYLENOL) 500 MG tablet Take 500 mg by mouth every 6 (six) hours as needed for mild pain.   Marland Kitchen acetylcysteine (MUCOMYST) 20 % nebulizer solution Take 3 mLs by nebulization every 6 (six) hours.   . ALPRAZolam (XANAX) 1 MG tablet Take 1 mg by mouth 4 (four) times daily.    Marland Kitchen aspirin EC 81 MG tablet Take 81 mg by  mouth daily.   Marland Kitchen atorvastatin (LIPITOR) 40 MG tablet TAKE ONE TABLET BY MOUTH DAILY.   Marland Kitchen DIGOX 125 MCG tablet TAKE ONE TABLET BY MOUTH ONCE DAILY.   . fluticasone (FLONASE) 50 MCG/ACT nasal spray Place 2 sprays into both nostrils daily. 03/18/2015: Patient states she currently  takes as needed for seasonal allergies.  . furosemide (LASIX) 40 MG tablet TAKE ONE TABLET BY MOUTH ONCE DAILY. 03/10/2017: Pt states she only take prn if legs swell along with potassium to prevent cramps  . gabapentin (NEURONTIN) 100 MG capsule Take 1 capsule by mouth 3 (three) times daily.   Marland Kitchen glipiZIDE (GLUCOTROL XL) 2.5 MG 24 hr tablet 2.5 mg daily.    Marland Kitchen JANUVIA 100 MG tablet TAKE ONE TABLET BY MOUTH DAILY.   Marland Kitchen levalbuterol (XOPENEX) 0.63 MG/3ML nebulizer solution Take 3 mLs (0.63 mg total) by nebulization every 6 (six) hours.   Marland Kitchen lisinopril (PRINIVIL,ZESTRIL) 2.5 MG tablet TAKE ONE TABLET BY MOUTH DAILY.   . Magnesium Oxide 250 MG TABS Take 250 mg by mouth daily.    . metFORMIN (GLUCOPHAGE) 850 MG tablet TAKE 1 TABLET BY MOUTH TWICE DAILY WITH MEALS.   . metoprolol (LOPRESSOR) 50 MG tablet Take 2 tablets in the morning and 1 1/2 tablets at night   . montelukast (SINGULAIR) 10 MG tablet Take 1 tablet by mouth daily.   . Multiple Vitamin (MULTIVITAMIN) capsule Take 1 capsule by mouth daily.   . potassium chloride SA (K-DUR,KLOR-CON) 20 MEQ tablet Take 1 tablet (20 mEq total) by mouth daily.   Marland Kitchen PRADAXA 150 MG CAPS capsule TAKE ONE CAPSULE BY MOUTH TWICE DAILY.   . sodium chloride (OCEAN) 0.65 % nasal spray Place 1 spray into the nose as needed for congestion. Congestion   . SYMBICORT 160-4.5 MCG/ACT inhaler INHALE 2 PUFFS INTO THE LUNGS TWICE DAILY. RINSE MOUTH AFTER USE.    No facility-administered encounter medications on file as of 06/29/2017.     Functional Status:   In your present state of health, do you have any difficulty performing the following activities: 03/10/2017 02/18/2017  Hearing? N N  Vision? N N  Difficulty concentrating or making decisions? N N  Walking or climbing stairs? Y N  Dressing or bathing? N N  Doing errands, shopping? N N  Preparing Food and eating ? N -  Using the Toilet? N -  In the past six months, have you accidently leaked urine? N -  Do you have  problems with loss of bowel control? N -  Managing your Medications? N -  Managing your Finances? N -  Housekeeping or managing your Housekeeping? N -  Some recent data might be hidden    Fall/Depression Screening:    Fall Risk  05/17/2017 03/10/2017 09/09/2015  Falls in the past year? Yes Yes No  Number falls in past yr: 2 or more 2 or more -  Injury with Fall? Yes No -  Risk Factor Category  High Fall Risk High Fall Risk -  Risk for fall due to : History of fall(s);Impaired balance/gait;Medication side effect History of fall(s);Impaired balance/gait;Medication side effect Impaired balance/gait  Risk for fall due to: Comment - - -  Follow up Education provided;Falls evaluation completed;Follow up appointment;Falls prevention discussed Education provided;Falls prevention discussed -   PHQ 2/9 Scores 05/17/2017 03/10/2017 09/09/2015 03/18/2015 02/28/2015 01/25/2015  PHQ - 2 Score 0 0 2 1 2  0  PHQ- 9 Score - - 6 - 6 -    Assessment:   Mary  Nakayama met [facility] CM at her apartment door today using a four prone cane.  She reports preference of use of cane for safety and THN CM agree related her history of falls.   Mary Menon is visiting with her grand daughter, Luetta Nutting and her week and 64 old great grandson. THN CM and Mary Lowenstein again discussed the strong scent in her apartment.  She was noted to cough at intervals. Mary Rayon again states the scent is related to her burning candles.   She also mentions again that she believes that her neighbors are smoking and the odor comes through her vents.  Mary Firmin complains of pain in her thighs, legs and feet with difficulty in mobility. She reports that her Neurontin was increased from 100 mg to 300 mg by her pcp, Dr Nevada Crane but Mary Golda reports "it is not helping" When [facility] Cm discussed follow up pcp visits and possible referrals to specialists. Her last follow up with Dr Nevada Crane was on June 17 2017.  Mary Norby informs [facility] Cm that she is having difficulty with finances this  month and relates it to her medications and co pay for oxygen. THN CM and Mary Liddicoat reviewed her list of medications and discussed her rental of oxygen [facility]. Mary Vaughan informed [facility]N Cm she is "not using the oxygen" and reports getting better respiratory symptom results from use of her inhaler and nebulizer.  Reports she spoke [facility] are about coming to get her oxygen but was informed they would have to contact Dr Luan Pulling office to get recommendations from him Mary Rickles reports a monthly co pay of $25 for her oxygen ordered by dr Luan Pulling has portable tanks that she reports as being "heavy" and "giving out of breath taking it to her car." [facility] CM discussed with her that there is not oxygen in her nebulizer treatment or the inhaler   Cardiovascular status THN CM discussed her BP related to hypotension, dizziness and fall risks. Mary Thurow reports she is monitoring with use of her BP cuff, and oximeter plus using a medication called fusion plus and is waiting on money now to pay for medicines prescribed by Dr Claiborne Billings at her pharmacy. She continues to voice concern with her increase bruising when bumping against anything related to her Pradaa.  Bruises easily. New bruises on her forearms from bumping into "cabinets" and "reaching to get something between my car seats"   Reports rash disappearing and she continues to use the Over the counter cream to resolve it    No falls since last home visit reported    Plan: [facility] CM and Mary Flanagin contacted her insurance company to customer service to obtain a list of in network home health agencies within her zip code and to request the co pay cost for home health Spoke with Estill Bamberg who stated the following agencies are in network: [facility], [facility], [facility], [facility], [facility] and [facility]. Estill Bamberg stated there is no co pay for home health and no deductible  THN CM contacted pcp office to informed them that [facility] [name] CM and Mary  Vallely checked on the above and requested assistance with an order for physical therapy for possible exercises for neuropathy and provided the contact number for the home health agency of Mary Weseman choice, [facility] as [phone] [facility] CM also communicated that Mary Po states the increase of the gabapentin has not been effective and requested  recommendations Attempts made to contact [place] [facility] office without success Referral to [place] -66 yr old female with PMH COP, CHF, DM, Afib, CAD Recently had Neurontin increased from 100 to 300 mg by pcp for neuropathy During 06/29/17 home visit with complaint of dizziness, difficulty walking, increase pain of thighs, legs and feet event with use of Neurontin. Please check medication list for any contraindications. Also with concerns with cost of medications, not being able to afford, having medication at pharmacy not purchased yet related to cost, wanting to decrease monthly cost Has [employer] via [place] [facility].  Complains of increase bruising with Pradaxa.  THN CM previously consulted the [facility] MD pharmacist Route note to pcp and other care team members listed in [place] Problem One     Most Recent Value  Care Plan Problem One  knowledge deficit of home care for COPD  Role Documenting the Problem One  Care Management Coordinator  Care Plan for Problem One  Active  THN Long Term Goal   Over the next 60 days the patient will be able to verbilize understanding of home care for COPD  THN Long Term Goal Start Date  03/10/17  [facility] Long Term Goal Met Date  04/26/17  Interventions for Problem One Long Term Goal  assess knowledge of home care for COPD, educate on COPD/treatment/zones, evaluate knowledge of COPD   THN CM Short Term Goal #1   Over the next 30 days patient will verbalize and indicate knowledge of COPD home care  [facility] CM Short Term Goal #1 Start Date  03/10/17  [facility] CM Short Term Goal #1 Met Date  03/24/17   Interventions for Short Term Goal #1  assess knowledge of home care for COPD, educate on COPD/treatment/zones, evaluate knowledge of COPD     THN CM Care Plan Problem Two     Most Recent Value  Care Plan Problem Two  Recurrent falls possibly related to medications, home safety  Role Documenting the Problem Two  Care Management [place] for Problem Two  Active  Interventions for Problem Two Long Term Goal   assess home, pharmacy referral for medicine check, educate on medicines, review fall risks/precautions  THN Long Term Goal  Over the next 45 days medications and home will be checked for fall prevention precautions  THN Long Term Goal Start Date  03/10/17  [name] Long Term Goal Met Date  04/27/17  THN CM Short Term Goal #1   Over the next 30 days patient medicines will be checked by [place] and she will be able to verbalize knowledge of fall risks prevention measures  THN CM Short Term Goal #1 Start Date  03/10/17  [facility] CM Short Term Goal #1 Met Date   04/22/17  Interventions for Short Term Goal #2   assess home, pharmacy referral for medicine check, educate on medicines, review fall risks/precautions      Thayden Lemire L. Lavina Hamman, RN, BSN, [place] Care Management [phone]

## 2017-06-30 ENCOUNTER — Other Ambulatory Visit: Payer: Self-pay | Admitting: *Deleted

## 2017-06-30 NOTE — Patient Outreach (Signed)
[place] [facility]) Care Management  06/30/2017  Mary [name] 1951/08/28 [id number]   Care Coordination  THN CM spoke with Jacqlyn Larsen, Dr Luan Pulling office manager about [name] oxygen concerns and the 94% Oxygen sat received on 06/30/17 and will contact [facility] for [name] to discontinue the oxygen order and to come to pick up oxygen DME [facility] CM updated [name] about [facility] pharmacy referral, updated on pending response from Dr Nevada Crane office, and contact made with Jacqlyn Larsen at Dr Luan Pulling to assist with oxygen needs [name] voiced appreciation for [facility] CM interventions completed  Plans Continue to collaborate with Janine Ores, pulmonologist, Luan Pulling [facility]  Pharmacist - Follow up with [name] in 4-5 weeks   Joelene Millin L. Lavina Hamman, RN, BSN, [place] Care Management [phone]

## 2017-07-05 ENCOUNTER — Encounter (INDEPENDENT_AMBULATORY_CARE_PROVIDER_SITE_OTHER): Payer: Self-pay | Admitting: Internal Medicine

## 2017-07-05 ENCOUNTER — Encounter (INDEPENDENT_AMBULATORY_CARE_PROVIDER_SITE_OTHER): Payer: Self-pay

## 2017-07-08 ENCOUNTER — Other Ambulatory Visit: Payer: Self-pay | Admitting: Pharmacist

## 2017-07-08 NOTE — Patient Outreach (Signed)
[place] [facility]) Care Management  07/08/2017  LEE-ANN GAL Jan 29, 1951 [id number]  Patient referred to [place] pharmacist by [facility] RN for medication review due to patient report dizziness and medication assistance needs.   Called both home and cell phone numbers, no answer, and no voicemail.   Plan: Will attempt second outreach to patient next week.   Karrie Meres, PharmD, [place] [phone]

## 2017-07-09 ENCOUNTER — Other Ambulatory Visit: Payer: Self-pay | Admitting: *Deleted

## 2017-07-12 ENCOUNTER — Ambulatory Visit: Payer: Self-pay | Admitting: Pharmacist

## 2017-07-13 ENCOUNTER — Other Ambulatory Visit: Payer: Self-pay | Admitting: Pharmacist

## 2017-07-13 NOTE — Patient Outreach (Signed)
[name] Care Management  07/13/2017  Mary [name] 12/23/1950 [id number]  Second outreach attempt---no answer, no voicemail picked-up.   Plan:  Will make third outreach attempt in the next week.   Karrie Meres, PharmD, [place] [phone]

## 2017-07-20 ENCOUNTER — Other Ambulatory Visit: Payer: Self-pay | Admitting: Pharmacist

## 2017-07-20 NOTE — Patient Outreach (Signed)
[place] [facility]) Care Management  07/20/2017  Mary [name] 08/20/1951 [id number]  Third outreach attempt to patient---no answer, HIPAA compliant message left requesting return call.   Plan:  Will sent patient outreach letter.   If no reply from patient in 10 business days, will close case.   Karrie Meres, PharmD, [place] [phone]

## 2017-07-21 ENCOUNTER — Encounter: Payer: Self-pay | Admitting: Pharmacist

## 2017-07-22 DIAGNOSIS — E1140 Type 2 diabetes mellitus with diabetic neuropathy, unspecified: Secondary | ICD-10-CM | POA: Diagnosis not present

## 2017-07-22 DIAGNOSIS — R195 Other fecal abnormalities: Secondary | ICD-10-CM | POA: Diagnosis not present

## 2017-07-22 DIAGNOSIS — Z6820 Body mass index (BMI) 20.0-20.9, adult: Secondary | ICD-10-CM | POA: Diagnosis not present

## 2017-07-22 DIAGNOSIS — I2510 Atherosclerotic heart disease of native coronary artery without angina pectoris: Secondary | ICD-10-CM | POA: Diagnosis not present

## 2017-07-22 DIAGNOSIS — Z23 Encounter for immunization: Secondary | ICD-10-CM | POA: Diagnosis not present

## 2017-07-22 DIAGNOSIS — I509 Heart failure, unspecified: Secondary | ICD-10-CM | POA: Diagnosis not present

## 2017-07-23 ENCOUNTER — Other Ambulatory Visit: Payer: Self-pay | Admitting: Cardiovascular Disease

## 2017-07-29 ENCOUNTER — Encounter: Payer: Self-pay | Admitting: Cardiovascular Disease

## 2017-07-29 ENCOUNTER — Ambulatory Visit (INDEPENDENT_AMBULATORY_CARE_PROVIDER_SITE_OTHER): Payer: Medicare Other | Admitting: Cardiovascular Disease

## 2017-07-29 VITALS — BP 142/80 | HR 67 | Ht 66.0 in | Wt 122.6 lb

## 2017-07-29 DIAGNOSIS — E785 Hyperlipidemia, unspecified: Secondary | ICD-10-CM

## 2017-07-29 DIAGNOSIS — Z7901 Long term (current) use of anticoagulants: Secondary | ICD-10-CM | POA: Diagnosis not present

## 2017-07-29 DIAGNOSIS — I2510 Atherosclerotic heart disease of native coronary artery without angina pectoris: Secondary | ICD-10-CM | POA: Diagnosis not present

## 2017-07-29 DIAGNOSIS — I482 Chronic atrial fibrillation: Secondary | ICD-10-CM

## 2017-07-29 DIAGNOSIS — J449 Chronic obstructive pulmonary disease, unspecified: Secondary | ICD-10-CM | POA: Diagnosis not present

## 2017-07-29 DIAGNOSIS — I2583 Coronary atherosclerosis due to lipid rich plaque: Secondary | ICD-10-CM

## 2017-07-29 DIAGNOSIS — I4510 Unspecified right bundle-branch block: Secondary | ICD-10-CM | POA: Diagnosis not present

## 2017-07-29 DIAGNOSIS — Z951 Presence of aortocoronary bypass graft: Secondary | ICD-10-CM

## 2017-07-29 DIAGNOSIS — R0989 Other specified symptoms and signs involving the circulatory and respiratory systems: Secondary | ICD-10-CM

## 2017-07-29 DIAGNOSIS — I4821 Permanent atrial fibrillation: Secondary | ICD-10-CM

## 2017-07-29 NOTE — Progress Notes (Signed)
Patient ID: [name], female   DOB: 1951/04/01, 66 y.o.   MRN: [id number]    PCP: Dr. Wende Neighbors  HPI: SHANETTE TAMARGO is a 66 y.o. female who presents to the office for a 6 month followup cardiology evaluation.   Ms. Walthour has a history of permanent atrial fibrillation, hypertension, type 2 diabetes mellitus, COPD and long-standing prior history of tobacco use. In February 2014 she was hospitalized and felt to have diastolic congestive heart failure. She has been on low-dose ACE inhibitor. An echo Doppler in November 2013 showed an ejection fraction of 55-60% with grade 1 diastolic dysfunction, moderate mitral regurgitation, moderate tricuspid regurgitation, biatrial enlargement.   On 01/07/2014  she underwent cardiac catheterization after she  presented to the hospital with left-sided chest pain, associated with diaphoresis, weakness, nausea and vomiting.  Cardiac catheterization demonstrated 80% tapered calcified left main stenosis with 3 vessel CAD involving a subtotal proximal to mid LAD stenosis with TIMI 2 flow, an occluded circumflex vessel.  After the first large bifurcating obtuse marginal branch as well as 70% mid AV groove stenosis and a small-caliber occluded mid RCA.  She underwent CABG revascularization surgery on 01/09/2014 by Dr. Tharon Aquas Tright and had a LIMA placed to the LAD, a saphenous vein graft to the ramus intermediate, saphenous vein graft to the circumflex marginal vessel.  She also had application of a left atrial clip.  Preoperative ejection fraction was 30%.  In April 2015 she was on a medical regimen, consisting of Lasix 40 mg, lisinopril 2.5 mg twice a day, digoxin, 0.125 mg, metoprolol tartrate 12.5 twice a day.  An echo Doppler study on 04/17/2014 showed improvement in her Ejection fraction at 55-60%, although she had a pseudo-normal left ventricular filling pattern consistent with grade 2 diastolic dysfunction.  Mild hypokinesis of the mid, anteroseptal, basal,  inferoseptal and apical septal wall.  Mitral annular calcification, mild MR, moderate LA, and mild RA dilatation with mild TR. She had been in sinus rhythm and when last seen by me July was maintaining sinus rhythm.  At that time, I discontinued her Lanoxin and further titrated her metoprolol tartrate from 12.5 twice a day to 25 mg twice a day.  Postoperatively, she had converted initially to sinus rhythm and was in normal sinus rhythm as of her July 2015 evaluation.  A nuclear perfusion study on 01/03/2015 which revealed normal perfusion.  The study was not gated due to her atrial fibrillation.  Apparently, she had recently experienced a presyncopal episode.  She was referred for MR of her brain which raised the possibility of a small nonhemorrhagic infarct within the subcortical white matter of the right postcentral gyrus.  There also is moderate  Age advanced periventricular and subcortical T2 changes.  She is followed by Dr. Buelah Manis. She tells me she is scheduled to have an EEG later this week.  An echo Doppler study in March 2017 showed an EF of 55-60%.  She had trivial aortic insufficiency.  A peak gradient of her aortic valve was 10 mm.  There was mild aortic regurgitation.  She underwent carotid duplex imaging which showed heterogeneous plaque bilaterally.  There were essentially stable 40-59% right internal carotid artery stenoses and 60-79% left internal carotid artery stenoses.  She greater than 50% bilateral external carotid stenosis.  She had normal subclavian arteries bilaterally with antegrade flow to her vertebrals.  She admits to bilateral neuropathy in both lower extremities.  She denies significant edema.  She had had blood work  in [place] in July.  Her cholesterol was 104, triglycerides 103, HDL 38, and LDL 45.  Hemoglobin was 11.4 and hematocrit 35.7.  She does have reduced iron saturation at 8%.   When I last saw her, she was complaining of being under increased stress.  She was staying  fatigue 10, was tired most of the time.  Her sleep was poor.  She was waking up several times per night for nocturia.She was hospitalized on 11/04/2016 through 11/06/2016 with shortness of breath.  He was felt possibly to have combination of mild COPD exacerbation and acute on chronic CHF.  He responded to IV Lasix as well as low-dose steroids and a Z-Pak and ultimately was sent home.  She has been seen by Dr. Wende Neighbors was requested she undergo carotid ultrasound imaging.  Since I last saw her, she has noticed leg discomfort.  She has had issues with peripheral neuropathy.  Carotid study in April 2018 showed heterogeneous plaque bilaterally with 40-59% right ICA stenosis and 60-79% left ICA stenosis which were stable.  She had patent vertebral arteries with antegrade flow.  She was hospitalized in April 2018 with cough and COPD exacerbation and initially required BiPAP metoprolol was held transiently due to hypertension.  Presently, she denies any chest pain.  Her breathing has improved.  She has been on atorvastatin for hyperlipidemia at 40 mg.  She is diabetic on glipizide, metformin and Januvia.  She's had issues with neuropathy for which she has been taking gabapentin.  She has been on lisinopril 2.5 mg, metoprolol 100 mg in the morning and 51 g at night with her atrial fibrillation as well as 0.125 digoxin for rate control.  She presents for evaluation  Past Medical History:  Diagnosis Date  . Anxiety   . Arthritis   . Atrial fibrillation ([place])   . Bronchial asthma   . CHF (congestive heart failure) ([place])   . Chronic pain    Bacl pain, Disc L5-S1- Dr. Joya Salm in [place]  . COPD (chronic obstructive pulmonary disease) ([place])   . DDD (degenerative disc disease)    Radicular symptoms  . Diabetes mellitus   . History of stress test 06/2011   Abnormal myocardial perfusion study.  Marland Kitchen Hx of echocardiogram    Was interpreted by Dr Doylene Canard that showed an Ef in the 50-60% range with grade 1 diastolic  dysfunction. she had moderate MR, biatrial enlargement, moderate TR and at that time estimated RV systolic pressure was 43 mm.  . Hyperlipidemia   . Hypertension   . Neuropathy   . Shortness of breath   . Tachycardia     Past Surgical History:  Procedure Laterality Date  . ABDOMINAL HYSTERECTOMY     partial  . Breast cyst removed    . CARPAL TUNNEL RELEASE     x 2  . CORONARY ARTERY BYPASS GRAFT N/A 01/09/2014   Procedure: CORONARY ARTERY BYPASS GRAFTING (CABG) x 3 using endoscopically harvested right saphenous vein and left internal mammary artery and closure of left atrial appendage;  Surgeon: Ivin Poot, MD;  Location: [facility];  Service: Open Heart Surgery;  Laterality: N/A;  patient has preop IA BP   . INTRAOPERATIVE TRANSESOPHAGEAL ECHOCARDIOGRAM N/A 01/09/2014   Procedure: INTRAOPERATIVE TRANSESOPHAGEAL ECHOCARDIOGRAM;  Surgeon: Ivin Poot, MD;  Location: [facility];  Service: Open Heart Surgery;  Laterality: N/A;  . LEFT HEART CATHETERIZATION WITH CORONARY ANGIOGRAM N/A 01/07/2014   Procedure: LEFT HEART CATHETERIZATION WITH CORONARY ANGIOGRAM;  Surgeon: Lorretta Harp,  MD;  Location: [facility];  Service: Cardiovascular;  Laterality: N/A;  . TONSILLECTOMY    . TUBAL LIGATION      Allergies  Allergen Reactions  . Adhesive [Tape] Other (See Comments)    Tears skin off.   . Latex Other (See Comments)    In tape, tears skin off.  . Zocor [Simvastatin - High Dose] Nausea And Vomiting    Current Outpatient Prescriptions  Medication Sig Dispense Refill  . ACCU-CHEK AVIVA PLUS test strip USE TO TEST 3 TO 4 TIMES DAILY OR AS DIRECTED. 100 each PRN  . acetaminophen (TYLENOL) 500 MG tablet Take 500 mg by mouth every 6 (six) hours as needed for mild pain.    Marland Kitchen acetylcysteine (MUCOMYST) 20 % nebulizer solution Take 3 mLs by nebulization every 6 (six) hours. 30 mL 0  . ALPRAZolam (XANAX) 1 MG tablet Take 1 mg by mouth 4 (four) times daily.     Marland Kitchen aspirin EC 81 MG tablet Take 81 mg  by mouth daily.    Marland Kitchen atorvastatin (LIPITOR) 40 MG tablet TAKE ONE TABLET BY MOUTH DAILY. 90 tablet 3  . DIGOX 125 MCG tablet TAKE ONE TABLET BY MOUTH ONCE DAILY. 30 tablet 10  . fluticasone (FLONASE) 50 MCG/ACT nasal spray Place 2 sprays into both nostrils daily. 16 g 6  . furosemide (LASIX) 40 MG tablet TAKE ONE TABLET BY MOUTH ONCE DAILY. 30 tablet 6  . gabapentin (NEURONTIN) 100 MG capsule Take by mouth 3 (three) times daily.     Marland Kitchen glipiZIDE (GLUCOTROL XL) 2.5 MG 24 hr tablet 2.5 mg daily.     Marland Kitchen JANUVIA 100 MG tablet TAKE ONE TABLET BY MOUTH DAILY. 30 tablet 0  . levalbuterol (XOPENEX) 0.63 MG/3ML nebulizer solution Take 3 mLs (0.63 mg total) by nebulization every 6 (six) hours. 3 mL 0  . lisinopril (PRINIVIL,ZESTRIL) 2.5 MG tablet TAKE ONE TABLET BY MOUTH DAILY. 30 tablet 0  . Magnesium Oxide 250 MG TABS Take 250 mg by mouth daily.     . metFORMIN (GLUCOPHAGE) 850 MG tablet TAKE 1 TABLET BY MOUTH TWICE DAILY WITH MEALS. 60 tablet 0  . metoprolol tartrate (LOPRESSOR) 50 MG tablet TAKE 2 TABLETS BY MOUTH IN THE MORNING AND 1 AND 1/2 TABLETS AT BEDTIME. 105 tablet 0  . montelukast (SINGULAIR) 10 MG tablet Take 1 tablet by mouth daily.    . Multiple Vitamin (MULTIVITAMIN) capsule Take 1 capsule by mouth daily.    . potassium chloride SA (K-DUR,KLOR-CON) 20 MEQ tablet Take 1 tablet (20 mEq total) by mouth daily. 30 tablet 0  . PRADAXA 150 MG CAPS capsule TAKE ONE CAPSULE BY MOUTH TWICE DAILY. 60 capsule 3  . sodium chloride (OCEAN) 0.65 % nasal spray Place 1 spray into the nose as needed for congestion. Congestion    . SYMBICORT 160-4.5 MCG/ACT inhaler INHALE 2 PUFFS INTO THE LUNGS TWICE DAILY. RINSE MOUTH AFTER USE. 10.2 g 0   No current facility-administered medications for this visit.     Social History   Social History  . Marital status: Divorced    Spouse name: N/A  . Number of children: 1  . Years of education: 66   Occupational History  . Not on file.   Social History Main  Topics  . Smoking status: Former Smoker    Packs/day: 1.50    Years: 30.00    Types: Cigarettes    Quit date: 05/26/2013  . Smokeless tobacco: Never Used     Comment: smells  of heavy smoke 02/18/17  . Alcohol use No  . Drug use: No  . Sexual activity: Yes    Birth control/ protection: Surgical   Other Topics Concern  . Not on file   Social History Narrative   Lives at home with sister and sister-in-law   Caffeine use : drink 1 cup coffee in morning        Socially she is divorce. She has one deceased child and 2 grandchildren. She quit tobacco at the end of August 2014.  She now has one great grandchild. There is no alcohol use. She does not routinely exercise.  Family History  Problem Relation Age of Onset  . Diabetes Mother   . Heart disease Mother   . Diabetes Sister   . Hypertension Sister   . Hyperlipidemia Sister   . Diabetes Brother   . Heart disease Brother   . Diabetes Brother   . Heart disease Brother    ROS General: Negative; No fevers, chills, or night sweats; positive for fatigue HEENT: Negative; No changes in vision or hearing, sinus congestion, difficulty swallowing Pulmonary: Positive for mild shortness of breath; history of COPD ;No cough, wheezing, , hemoptysis Cardiovascular: Positive for palpitations.  Mild shortness of breath with activity.  No chest pressure. GI: Positive for occasional nausea, no vomiting, diarrhea, or abdominal pain GU: Negative; No dysuria, hematuria, or difficulty voiding Musculoskeletal: Negative; no myalgias, joint pain, or weakness Hematologic/Oncology: Negative; no easy bruising, bleeding Endocrine: Negative; no heat/cold intolerance; positive for diabetes Neuro: Positive for peripheral neuropathy Skin: Negative; No rashes or skin lesions Psychiatric: positive for anxiety depression Sleep: Negative; No snoring, daytime sleepiness, hypersomnolence, bruxism, restless legs, hypnogognic hallucinations, no cataplexy Other  comprehensive 14 point system review is negative   PE BP (!) 142/80   Pulse 67   Ht 5' 6"  (1.676 m)   Wt 122 lb 9.6 oz (55.6 kg)   BMI 19.79 kg/m    Repeat blood pressure by me was 118/70  Wt Readings from Last 3 Encounters:  07/29/17 122 lb 9.6 oz (55.6 kg)  06/29/17 118 lb (53.5 kg)  03/24/17 132 lb (59.9 kg)   General: Alert, oriented, no distress.  Skin: normal turgor, no rashes, warm and dry HEENT: Normocephalic, atraumatic. Pupils equal round and reactive to light; sclera anicteric; extraocular muscles intact;  Nose without nasal septal hypertrophy Mouth/Parynx benign; Mallinpatti scale3 Neck: No JVD, bilateral carotid bruits , left greater than right.; normal carotid upstroke Lungs: Decreased breath sounds without wheezing.; no wheezing or rales Chest wall: without tenderness to palpitation Heart: PMI not displaced, RRR, s1 s2 normal, 2/6 systolic murmu in the aortic area and left sternal border r, no diastolic murmur, no rubs, gallops, thrills, or heaves Abdomen: soft, nontender; no hepatosplenomehaly, BS+; abdominal aorta nontender and not dilated by palpation. Back: no CVA tenderness Pulses 2+ Musculoskeletal: full range of motion, normal strength, no joint deformities Extremities: no clubbing cyanosis or edema, Homan's sign negative  Neurologic: grossly nonfocal; Cranial nerves grossly wnl Psychologic: Normal mood and affect   ECG (independently read by me): Atrial fibrillation at 67 bpm.  Right bundle branch block with repolarization changes.  T-wave abnormality inferolaterally  March 2018 ECG (independently read by me): Atrial fibrillation at 66 bpm.  Right bundle branch block with repolarization changes.  September 2017 ECG (independently read by me): Atrial fibrillation at 74 bpm with her previously noted chronic right bundle branch lock and T-wave abnormalities.  May 2017 ECG (independently read by me): Atrial fibrillation  at 75 bpm with right bundle branch  block and repolarization changes  November 2016 ECG (independently read by me):  Atrial fibrillation with a controlled ventricular response at 69 bpm. Right bundle branch block.  May 2016 ECG (independently read by me):  Atrial fibrillation at 75 bpm.  Right bundle branch block with repolarization changes.  December 2015ECG (independently read by me) atrial fibrillation with a ventricular rate at 10 2 bpm.  Right bundle-branch block with repolarization changes.  Nonspecific ST changes.  09/04/2014 ECG (independently read by me): atrial fibrillation with ventricular response at 1 20 bpm with right bundle branch block, repolarization changes.  04/25/2014 ECG (independently read by me): Normal sinus rhythm at 63 beats per minute.  Recommend followup with repolarization changes.  Prior April 2015 ECG: Normal sinus rhythm with right bundle branch block and repolarization changes.  Prior 10/04/2013 ECG: Atrial fibrillation at a rate At approximately 100-120 beats per minute. Nonspecific ST changes.  LABS: I reviewed lab work done by Dr. Wende Neighbors  In 2018.  Total cholesterol 128, HDL 57, triglycerides 85, LDL 54. She had been on steroids, her glucose was elevated at 151.  Her hemoglobin A1c had increased to 6.7.  BMP Latest Ref Rng & Units 02/24/2017 02/23/2017 02/22/2017  Glucose 65 - 99 mg/dL 258(H) 301(H) 339(H)  BUN 6 - 20 mg/dL 30(H) 26(H) 23(H)  Creatinine 0.44 - 1.00 mg/dL 0.54 0.52 0.53  Sodium 135 - 145 mmol/L 134(L) 133(L) 134(L)  Potassium 3.5 - 5.1 mmol/L 4.5 4.4 4.5  Chloride 101 - 111 mmol/L 95(L) 93(L) 93(L)  CO2 22 - 32 mmol/L 33(H) 33(H) 33(H)  Calcium 8.9 - 10.3 mg/dL 9.0 9.4 9.3   Hepatic Function Latest Ref Rng & Units 02/24/2017 02/23/2017 02/22/2017  Total Protein 6.5 - 8.1 g/dL 5.4(L) 6.0(L) 6.1(L)  Albumin 3.5 - 5.0 g/dL 2.8(L) 3.3(L) 3.3(L)  AST 15 - 41 U/L 15 13(L) 13(L)  ALT 14 - 54 U/L 18 15 13(L)  Alk Phosphatase 38 - 126 U/L 42 49 50  Total Bilirubin 0.3 - 1.2 mg/dL  0.5 0.5 0.4  Bilirubin, Direct 0.0 - 0.5 mg/dL - - -   CBC Latest Ref Rng & Units 02/24/2017 02/23/2017 02/22/2017  WBC 4.0 - 10.5 K/uL 16.8(H) 18.7(H) 15.0(H)  Hemoglobin 12.0 - 15.0 g/dL 10.5(L) 11.1(L) 10.3(L)  Hematocrit 36.0 - 46.0 % 32.5(L) 33.8(L) 32.2(L)  Platelets 150 - 400 K/uL 228 245 237   Lab Results  Component Value Date   MCV 90.8 02/24/2017   MCV 91.6 02/23/2017   MCV 92.8 02/22/2017   Lab Results  Component Value Date   TSH 0.698 05/15/2014    Lab Results  Component Value Date   HGBA1C 6.9 (H) 02/18/2017   Lipid Panel     Component Value Date/Time   CHOL 186 09/28/2014 1115   TRIG 144 09/28/2014 1115   HDL 55 09/28/2014 1115   CHOLHDL 3.4 09/28/2014 1115   VLDL 29 09/28/2014 1115   LDLCALC 102 (H) 09/28/2014 1115   LDLDIRECT 66 04/14/2012 1200    RADIOLOGY: No results found.  IMPRESSION:  1. Coronary artery disease due to lipid rich plaque   2. Hx of CABG   3. Permanent atrial fibrillation ([place])   4. Bilateral carotid bruits   5. Anticoagulation adequate   6. RBBB   7. Hyperlipidemia with target LDL less than 70   8. Chronic obstructive pulmonary disease, unspecified COPD type ([place])     ASSESSMENT AND PLAN: Ms. Sopher is a 66 year old  Caucasian female who has permanent atrial fibrillation and continues to be on Pradaxa for anticoagulation. She has a history of significant prior tobacco use, but fortunately she has quit smoking.  On 01/09/2014 she underwent CABG surgery for severe multivessel CAD, including left main stenosis.  At that time, she did have a left atrial clip.  Preoperatively, her ejection fraction was 30%.  Postoperatively, her ejection fraction had improved to 55-60%. She denies any recurrent anginal symptoms.  Her ECG today confirms atrial fibrillation with rate control.  Her blood pressure is stable on lisinopril 2.5 mg, metoprolol 100 mg in the morning and 75 mg at night, in addition to her furosemide which she has been taking 40 mg.  She  does have trace ankle swelling intermittently.  She's had issues with peripheral neuropathy.  I reviewed her [facility] hospitalization for COPD exacerbation.  Presently, she is now wheezing and continues to be on Singulair and Symbicort.  She is on atorvastatin 40 mg for hyperlipidemia.  He denies any episodes of chest pain or anginal symptomatology.  Following her surgery, she initially was not restarted on Pradaxa since she was maintaining sinus rhythm.   She developed recurrent AF has since been on chronic Pradaxa 150 mg twice a day.  Her last echo Doppler study continues to show normal systolic function.  Her blood pressure today is stable on metoprolol 100 mg in the morning and 75 Mill grams at night, lisinopril 2.5 mg, furosemide 40 g daily.  She also is on digoxin 0.125 mg to help with rate control of her atrial fibrillation.  She continues to take grams for hyperlipidemia with target LDL less than 70 in this diabetic female.  She has COPD and continues to be on Symbicort and Xopenex..  She is diabetic on glipizide, Januvia, and metformin.  I reviewed her carotid studies with her in detail, which show stable heterogeneous plaque, left greater than right, as noted above.  She will continue current therapy.  I will see her in 6 months for cardiology reevaluation.   Time spent: 25 minutes  Troy Sine, MD, [facility]  08/04/2017 6:46 PM

## 2017-07-29 NOTE — Patient Instructions (Signed)

## 2017-08-02 ENCOUNTER — Ambulatory Visit (INDEPENDENT_AMBULATORY_CARE_PROVIDER_SITE_OTHER): Payer: Medicare Other | Admitting: Internal Medicine

## 2017-08-03 ENCOUNTER — Other Ambulatory Visit: Payer: Self-pay | Admitting: *Deleted

## 2017-08-03 ENCOUNTER — Other Ambulatory Visit: Payer: Self-pay | Admitting: Pharmacist

## 2017-08-03 NOTE — Patient Outreach (Signed)
[name]) Care Management   08/03/2017  Mary [name] 1951-04-16 [id number]  Mary [name] is an 66 y.o. female with a PMH of COPD; chronic systolic HF (EF 78-93% in 2015), Afibon Pradaxa; DM and anxiety presenting to the hospital on 02/18/17 with worsening SOB. She confirmed in the hospital she still smokes occasional Cigarettes.  She was discharged on 02/24/17. Mary [name] has not been hospitalized since 02/18/17 over 5 months ago.  This THN CM received areferral on 5/2/18from [name]., [facility]'s THNliaison, to engage for transition of care calls and evaluate for monthly home visits. Hospital dx COPD Exacerbation, Acute on chronic respiratory failure with hypoxia She was discharged on 02/24/17 Mary [name] to a home visit on 03/10/17.  Subjective: "I'm tired and a little weak today"  Objective:   BP 110/66   Pulse 72   Temp 97.7 F (36.5 C) (Oral)   Resp 20   SpO2 92%   Review of Systems  Constitutional: Positive for malaise/fatigue. Negative for chills, diaphoresis, fever and weight loss.  HENT: Negative.   Eyes: Negative.   Respiratory: Negative.   Cardiovascular: Negative.   Gastrointestinal: Negative.   Genitourinary: Negative.   Skin: Negative.   Neurological: Positive for dizziness and weakness.  Endo/Heme/Allergies: Bruises/bleeds easily.  Psychiatric/Behavioral: Negative.     Physical Exam  Constitutional: She is oriented to person, place, and time. She appears well-developed and well-nourished.  HENT:  Head: Normocephalic and atraumatic.  Eyes: Pupils are equal, round, and reactive to light. Conjunctivae are normal.  Neck: Normal range of motion. Neck supple.  Cardiovascular: Normal rate and regular rhythm.   Respiratory: Effort normal and breath sounds normal.  GI: Soft. Bowel sounds are normal.  Musculoskeletal: Normal range of motion.  Neurological: She is alert and oriented to person, place, and time.  Skin: Skin is warm and dry.   Psychiatric: She has a normal mood and affect. Her behavior is normal. Judgment and thought content normal.    Encounter Medications:   Outpatient Encounter Prescriptions as of 08/03/2017  Medication Sig Note  . ACCU-CHEK AVIVA PLUS test strip USE TO TEST 3 TO 4 TIMES DAILY OR AS DIRECTED.   Marland Kitchen acetaminophen (TYLENOL) 500 MG tablet Take 500 mg by mouth every 6 (six) hours as needed for mild pain.   Marland Kitchen acetylcysteine (MUCOMYST) 20 % nebulizer solution Take 3 mLs by nebulization every 6 (six) hours.   . ALPRAZolam (XANAX) 1 MG tablet Take 1 mg by mouth 4 (four) times daily.    Marland Kitchen aspirin EC 81 MG tablet Take 81 mg by mouth daily.   Marland Kitchen atorvastatin (LIPITOR) 40 MG tablet TAKE ONE TABLET BY MOUTH DAILY.   Marland Kitchen DIGOX 125 MCG tablet TAKE ONE TABLET BY MOUTH ONCE DAILY.   . fluticasone (FLONASE) 50 MCG/ACT nasal spray Place 2 sprays into both nostrils daily. 03/18/2015: Patient states she currently takes as needed for seasonal allergies.  . furosemide (LASIX) 40 MG tablet TAKE ONE TABLET BY MOUTH ONCE DAILY. 03/10/2017: Pt states she only take prn if legs swell along with potassium to prevent cramps  . gabapentin (NEURONTIN) 100 MG capsule Take by mouth 3 (three) times daily.  06/29/2017: Now 300 mg neurontin since 06/17/17   . glipiZIDE (GLUCOTROL XL) 2.5 MG 24 hr tablet 2.5 mg daily.    Marland Kitchen JANUVIA 100 MG tablet TAKE ONE TABLET BY MOUTH DAILY.   Marland Kitchen levalbuterol (XOPENEX) 0.63 MG/3ML nebulizer solution Take 3 mLs (0.63 mg total) by nebulization every 6 (  six) hours.   Marland Kitchen lisinopril (PRINIVIL,ZESTRIL) 2.5 MG tablet TAKE ONE TABLET BY MOUTH DAILY.   . Magnesium Oxide 250 MG TABS Take 250 mg by mouth daily.    . metFORMIN (GLUCOPHAGE) 850 MG tablet TAKE 1 TABLET BY MOUTH TWICE DAILY WITH MEALS.   . metoprolol tartrate (LOPRESSOR) 50 MG tablet TAKE 2 TABLETS BY MOUTH IN THE MORNING AND 1 AND 1/2 TABLETS AT BEDTIME.   . montelukast (SINGULAIR) 10 MG tablet Take 1 tablet by mouth daily.   . Multiple Vitamin  (MULTIVITAMIN) capsule Take 1 capsule by mouth daily.   . potassium chloride SA (K-DUR,KLOR-CON) 20 MEQ tablet Take 1 tablet (20 mEq total) by mouth daily.   Marland Kitchen PRADAXA 150 MG CAPS capsule TAKE ONE CAPSULE BY MOUTH TWICE DAILY.   . sodium chloride (OCEAN) 0.65 % nasal spray Place 1 spray into the nose as needed for congestion. Congestion   . SYMBICORT 160-4.5 MCG/ACT inhaler INHALE 2 PUFFS INTO THE LUNGS TWICE DAILY. RINSE MOUTH AFTER USE.    No facility-administered encounter medications on file as of 08/03/2017.     Functional Status:   In your present state of health, do you have any difficulty performing the following activities: 03/10/2017 02/18/2017  Hearing? N N  Vision? N N  Difficulty concentrating or making decisions? N N  Walking or climbing stairs? Y N  Dressing or bathing? N N  Doing errands, shopping? N N  Preparing Food and eating ? N -  Using the Toilet? N -  In the past six months, have you accidently leaked urine? N -  Do you have problems with loss of bowel control? N -  Managing your Medications? N -  Managing your Finances? N -  Housekeeping or managing your Housekeeping? N -  Some recent data might be hidden    Fall/Depression Screening:    Fall Risk  08/03/2017 06/29/2017 05/17/2017  Falls in the past year? Yes Yes Yes  Number falls in past yr: 2 or more - 2 or more  Injury with Fall? No - Yes  Risk Factor Category  High Fall Risk - High Fall Risk  Risk for fall due to : History of fall(s);Impaired balance/gait;Medication side effect - History of fall(s);Impaired balance/gait;Medication side effect  Risk for fall due to: Comment - - -  Follow up Education provided;Falls prevention discussed - Education provided;Falls evaluation completed;Follow up appointment;Falls prevention discussed   PHQ 2/9 Scores 08/03/2017 05/17/2017 03/10/2017 09/09/2015 03/18/2015 02/28/2015 01/25/2015  PHQ - 2 Score 0 0 0 2 1 2  0  PHQ- 9 Score - - - 6 - 6 -    Assessment:    Met Mary [name]  at her apartment with her grand daughter [name] and [name] present  Medications Pradaxa, Januvia and proair are reported to be the most expensive medicines she has to purchase per Mary Cerullo. She confirms Dr Claiborne Billings did not change any of her medications during the 07/29/17 office visit. When [facility] CM discussed [facility] pharmacist call attempts and unsuccessful outreach letter without a response from her Mary Long states she can not recall receiving a call or letter. THN CM reviewed [facility] pharmacist documented attempts [facility] CM assisted with coordination of call to [facility] pharmacist to discuss options for medication assistance  Concern voiced during this home visit- a yellow note left by a female "thad" about a delivery She has called and request a return call before he returns but has not heard from him. This visit was witnessed by  her neighbor, Dot. THN CM called then number on the note and a voice message indicated "thad" but no company name. Mary Britain called her neighbor who saw this female who think female from "[employer]"  Pending a possible return call from "thad'   Cardiac Saw Dr Claiborne Billings on 07/29/17 No changes in medications No edema, sob or weight gain noted today Continues to manage CHF and Afib with medications and diet.  Respiratory THN CM cautioned Mary Toro about need to develop a plan to prevent exacerbations during the upcoming winter months. Her plan now is to take her Symbicort, albuterol inhaler a 4-5 hours later then prn proair for emergencies.  Her oxygen was returned related not using it routinely. THN CM encouraged her to incorporate use of the incentive spirometer after the use of her Symbicort to help keep her lungs healthy and teach her how to take slow deep breaths. Mary Toutant states she will use her spirometer and if needed will contact Dr Luan Pulling prn "He said I could come back to see him if I ever needed him."  Falls None since the last [facility] CM home visit. Mary Butikofer reports an episode of dizziness that  prevented her from driving.  THN CM noted today Mary Kaylor has her quad cane out to use prn She reports being "tired weak" related to the present rainy weather. THN CM discussed decrease in sun light and vitamin d  Diabetes Mary Yousuf reports her cbg has range from 60 to 120 since her last [facility] CM visit.She reports she believes her last A1c was in 7s but goal is for her A1c to be in 5s.  THN CM checked [facility] to share with Mary Poblano that her 02/18/17 A1C was 6.9.  Upcoming Appointments Sees Dr [place] Crane next lab 09/22/17 OFV 11/ 30/18 3:20 pm   Plan:  [address] pharmacist, Lennette Bihari, to inquired if interventions available for donut hole. THN CM and Mary Stuckert to continue to monitor donut hole and address needs in 2019 as they arise [facility] CM will see Mary Antone in agreed upon 7-8 weeks for home visit for evaluation of chronic illness home management (CHF, Afib, DM, falls) THN CM to contact Mary Podgurski if [facility] CM receives a return call from "thad' Route notes to pcp, CV, endocrinologist and other listed pertinent care team members  [facility] CM Care Plan Problem One     Most Recent Value  Care Plan Problem One  knowledge deficit of home care for COPD  Role Documenting the Problem One  Care Management Coordinator  Care Plan for Problem One  Active  THN Long Term Goal   Over the next 60 days the patient will be able to verbilize understanding of home care for COPD  [facility] Long Term Goal Start Date  03/10/17  [facility] Long Term Goal Met Date  04/26/17  Interventions for Problem One Long Term Goal  assess knowledge of home care for COPD, educate on COPD/treatment/zones, evaluate knowledge of COPD   THN CM Short Term Goal #1   Over the next 30 days patient will verbalize and indicate knowledge of COPD home care  [facility] CM Short Term Goal #1 Start Date  03/10/17  [facility] CM Short Term Goal #1 Met Date  03/24/17  Interventions for Short Term Goal #1  assess knowledge of home care for COPD, educate on COPD/treatment/zones, evaluate knowledge of  COPD     [facility] CM Care Plan Problem Two     Most Recent Value  Care Plan Problem Two  Recurrent falls possibly related to medications, home safety  Role Documenting the Problem Two  Care Management [place] for Problem Two  Active  Interventions for Problem Two Long Term Goal   assess home, pharmacy referral for medicine check, educate on medicines, review fall risks/precautions  THN Long Term Goal  Over the next 45 days medications and home will be checked for fall prevention precautions  THN Long Term Goal Start Date  03/10/17  [facility] Long Term Goal Met Date  04/27/17  THN CM Short Term Goal #1   Over the next 30 days patient medicines will be checked by [place] and she will be able to verbalize knowledge of fall risks prevention measures  THN CM Short Term Goal #1 Start Date  03/10/17  [facility] CM Short Term Goal #1 Met Date   04/22/17  Interventions for Short Term Goal #2   assess home, pharmacy referral for medicine check, educate on medicines, review fall risks/precautions      Kimberly L. Lavina Hamman, RN, BSN, [place] Care Management [phone]

## 2017-08-03 NOTE — Patient Outreach (Signed)
[facility]) Care Management  08/03/2017  Mary [name] July 27, 1951 [id number]  Received call from [place], whom was completing home visit with patient.    [facility] Pharmacist has been unable to reach patient despite phone calls and outreach letter.   Today [facility] RN reports patient is no longer having medication concerns or medication cost concerns.  She reports patient is interested in patient assistance options for 2019 if she enters coverage gap.  Discussed coverage gap will still be present for Part D in 2019.   Unsure what manufacturer patient assistance requirements will be for 2019.  Discussed patient can contact [place] in 2019 if she would like to look into patient assistance options at that time.   Patient reports no need for [facility] Pharmacist to contact her at this time and pharmacy case closure was discussed while [facility] RN was still at patient's residence.   Plan:  Pharmacy case will be closed.   Karrie Meres, PharmD, [place] [phone]

## 2017-08-06 ENCOUNTER — Other Ambulatory Visit: Payer: Self-pay | Admitting: Cardiovascular Disease

## 2017-08-06 NOTE — Telephone Encounter (Signed)
REFILL 

## 2017-08-16 ENCOUNTER — Other Ambulatory Visit: Payer: Self-pay | Admitting: *Deleted

## 2017-08-16 DIAGNOSIS — I6523 Occlusion and stenosis of bilateral carotid arteries: Secondary | ICD-10-CM

## 2017-08-16 DIAGNOSIS — R0989 Other specified symptoms and signs involving the circulatory and respiratory systems: Secondary | ICD-10-CM

## 2017-08-18 NOTE — Patient Outreach (Signed)
[facility]) Care Management  08/18/2017  Mary [name] 05-04-1951 [id number]    Care coordination Late entry for 07/09/17  [facility] CM received a call from Mary [name] to update Cm that she had made another attempt to get her medications and the cost had decreased Mary janki dike [facility] CM for services rendered [facility] CM spoke with [facility] pharmacist Lennette Bihari to provide an update  Pharmacist still voices concern about Mary Harbeck taking xanax dose with side effects of dizziness plus her cardiac medications. Pharmacist would like to encourage her to decrease or stop use of xanax to prevent dizziness or falls Pharmacist had been attempting to reach Mary Gaul to review without success  [facility] CM will see Mary Zima for follow up home visit in 6-8 weeks and contact prn for acute concerns  Kimberly L. Lavina Hamman, RN, BSN, [place] Care Management [phone]

## 2017-08-23 ENCOUNTER — Other Ambulatory Visit: Payer: Self-pay | Admitting: Family Medicine

## 2017-08-23 ENCOUNTER — Other Ambulatory Visit: Payer: Self-pay | Admitting: Cardiovascular Disease

## 2017-08-26 ENCOUNTER — Ambulatory Visit (INDEPENDENT_AMBULATORY_CARE_PROVIDER_SITE_OTHER): Payer: Medicare Other | Admitting: Internal Medicine

## 2017-08-30 ENCOUNTER — Other Ambulatory Visit: Payer: Self-pay | Admitting: Cardiovascular Disease

## 2017-09-22 DIAGNOSIS — E1140 Type 2 diabetes mellitus with diabetic neuropathy, unspecified: Secondary | ICD-10-CM | POA: Diagnosis not present

## 2017-09-24 DIAGNOSIS — Z0001 Encounter for general adult medical examination with abnormal findings: Secondary | ICD-10-CM | POA: Diagnosis not present

## 2017-09-24 DIAGNOSIS — D509 Iron deficiency anemia, unspecified: Secondary | ICD-10-CM | POA: Diagnosis not present

## 2017-09-24 DIAGNOSIS — J069 Acute upper respiratory infection, unspecified: Secondary | ICD-10-CM | POA: Diagnosis not present

## 2017-09-28 ENCOUNTER — Other Ambulatory Visit: Payer: Self-pay

## 2017-09-28 ENCOUNTER — Other Ambulatory Visit: Payer: Self-pay | Admitting: *Deleted

## 2017-09-28 ENCOUNTER — Other Ambulatory Visit (HOSPITAL_COMMUNITY): Payer: Self-pay | Admitting: Internal Medicine

## 2017-09-28 DIAGNOSIS — R1032 Left lower quadrant pain: Secondary | ICD-10-CM

## 2017-09-28 NOTE — Patient Outreach (Signed)
[place] [facility]) Care Management   09/28/2017  Mary [name] [birth year] [id number]  Mary [name] is an 66 y.o. female with a PMH of COPD now withhome O2 as needed; chronic systolic HF (EF 14-48% in 2015), Afib on Pradaxa; DM and anxiety presenting to the hospital on 02/18/17 with worsening SOB. She was treated with IV Lasix, steroids, and azithromycin. She confirmed in the hospital she still smokes occasional Cigarettes. Was admitted to SDU for COPD Exacerbation and placed on and weaned off of aBiPAP She was transferred to the medical floor on 02/21/17 HerHospitalization is complicated by uncontrolled hyperglycemia with steroid use. She was discharged on 02/24/17. Mary [name] has not been hospitalized cine 02/18/17 over 7 months ago.  This THN CM received areferral on 5/2/18from [name]., [facility]'s THNliaison, to engage for transition of care calls and evaluate for monthly home visits. Hospital dx COPD Exacerbation, Acute on chronic respiratory failure with hypoxia She was discharged on 02/24/17 home per hospital discharge summary with [facility] for HHPT and oxygen, pcp is Z Nevada Crane and Pulmonologist is E Hawkins for follow up care.    Subjective:  See below notes  Objective:   BP 118/78   Pulse 74   Temp (!) 96.6 F (35.9 C) (Oral)   Resp 20   SpO2 94%   Review of Systems  Constitutional: Positive for malaise/fatigue. Negative for chills, diaphoresis, fever and weight loss.  HENT: Positive for congestion. Negative for ear discharge, ear pain, hearing loss, nosebleeds, sinus pain, sore throat and tinnitus.   Eyes: Negative.   Respiratory: Positive for cough, sputum production and wheezing. Negative for hemoptysis, shortness of breath and stridor.   Cardiovascular: Negative.  Negative for chest pain, palpitations, orthopnea, claudication and leg swelling.  Gastrointestinal: Negative.  Negative for abdominal pain, blood in stool, constipation, diarrhea, heartburn,  melena, nausea and vomiting.       Audible Increased bowel sounds heard from across the room  Last BM this Am 09/28/17   Genitourinary: Negative.   Musculoskeletal: Positive for joint pain. Negative for back pain, falls, myalgias and neck pain.  Skin: Negative.  Negative for itching and rash.  Neurological: Positive for dizziness and weakness. Negative for tingling, tremors, sensory change, speech change, focal weakness, seizures, loss of consciousness and headaches.  Endo/Heme/Allergies: Negative for environmental allergies and polydipsia. Bruises/bleeds easily.       Bruise on left anticubital space after lab draw  Psychiatric/Behavioral: Negative.  Negative for depression, hallucinations, memory loss, substance abuse and suicidal ideas. The patient is not nervous/anxious and does not have insomnia.     Physical Exam  Constitutional: She appears well-developed and well-nourished.  HENT:  Head: Normocephalic.  Eyes: Pupils are equal, round, and reactive to light.  Neck: Normal range of motion.  Cardiovascular: Normal rate and regular rhythm.  Respiratory: Effort normal. She has wheezes.  GI: Soft. Bowel sounds are normal.  Musculoskeletal: She exhibits tenderness.  Neurological: She is alert.  Skin: Skin is warm and dry.  Psychiatric: She has a normal mood and affect. Her behavior is normal. Judgment and thought content normal.    Encounter Medications:   Outpatient Encounter Medications as of 09/28/2017  Medication Sig Note  . ACCU-CHEK AVIVA PLUS test strip USE TO TEST 3 TO 4 TIMES DAILY OR AS DIRECTED.   Marland Kitchen acetaminophen (TYLENOL) 500 MG tablet Take 500 mg by mouth every 6 (six) hours as needed for mild pain.   Marland Kitchen acetylcysteine (MUCOMYST) 20 % nebulizer solution  Take 3 mLs by nebulization every 6 (six) hours.   . ALPRAZolam (XANAX) 1 MG tablet Take 1 mg by mouth 4 (four) times daily.    Marland Kitchen aspirin EC 81 MG tablet Take 81 mg by mouth daily.   Marland Kitchen atorvastatin (LIPITOR) 40 MG tablet  TAKE ONE TABLET BY MOUTH DAILY.   Marland Kitchen DIGOX 125 MCG tablet TAKE (1) TABLET BY MOUTH ONCE DAILY.   . fluticasone (FLONASE) 50 MCG/ACT nasal spray Place 2 sprays into both nostrils daily. 03/18/2015: Patient states she currently takes as needed for seasonal allergies.  . furosemide (LASIX) 40 MG tablet TAKE ONE TABLET BY MOUTH ONCE DAILY. 03/10/2017: Pt states she only take prn if legs swell along with potassium to prevent cramps  . gabapentin (NEURONTIN) 100 MG capsule Take by mouth 3 (three) times daily.  06/29/2017: Now 300 mg neurontin since 06/17/17   . glipiZIDE (GLUCOTROL XL) 2.5 MG 24 hr tablet 2.5 mg daily.    Marland Kitchen JANUVIA 100 MG tablet TAKE ONE TABLET BY MOUTH DAILY.   Marland Kitchen levalbuterol (XOPENEX) 0.63 MG/3ML nebulizer solution Take 3 mLs (0.63 mg total) by nebulization every 6 (six) hours.   Marland Kitchen lisinopril (PRINIVIL,ZESTRIL) 2.5 MG tablet TAKE ONE TABLET BY MOUTH DAILY.   . Magnesium Oxide 250 MG TABS Take 250 mg by mouth daily.    . metFORMIN (GLUCOPHAGE) 850 MG tablet TAKE 1 TABLET BY MOUTH TWICE DAILY WITH MEALS.   . metoprolol tartrate (LOPRESSOR) 50 MG tablet TAKE 2 TABLETS BY MOUTH IN THE MORNING AND 1 AND 1/2 TABLETS AT BEDTIME.   . montelukast (SINGULAIR) 10 MG tablet Take 1 tablet by mouth daily.   . Multiple Vitamin (MULTIVITAMIN) capsule Take 1 capsule by mouth daily.   . potassium chloride SA (K-DUR,KLOR-CON) 20 MEQ tablet Take 1 tablet (20 mEq total) by mouth daily.   Marland Kitchen PRADAXA 150 MG CAPS capsule TAKE ONE CAPSULE BY MOUTH TWICE DAILY.   . sodium chloride (OCEAN) 0.65 % nasal spray Place 1 spray into the nose as needed for congestion. Congestion   . SYMBICORT 160-4.5 MCG/ACT inhaler INHALE 2 PUFFS INTO THE LUNGS TWICE DAILY. RINSE MOUTH AFTER USE.    No facility-administered encounter medications on file as of 09/28/2017.     Functional Status:   In your present state of health, do you have any difficulty performing the following activities: 03/10/2017 02/18/2017  Hearing? N N  Vision? N  N  Difficulty concentrating or making decisions? N N  Walking or climbing stairs? Y N  Dressing or bathing? N N  Doing errands, shopping? N N  Preparing Food and eating ? N -  Using the Toilet? N -  In the past six months, have you accidently leaked urine? N -  Do you have problems with loss of bowel control? N -  Managing your Medications? N -  Managing your Finances? N -  Housekeeping or managing your Housekeeping? N -  Some recent data might be hidden    Fall/Depression Screening:    Fall Risk  08/03/2017 06/29/2017 05/17/2017  Falls in the past year? Yes Yes Yes  Number falls in past yr: 2 or more - 2 or more  Injury with Fall? No - Yes  Risk Factor Category  High Fall Risk - High Fall Risk  Risk for fall due to : History of fall(s);Impaired balance/gait;Medication side effect - History of fall(s);Impaired balance/gait;Medication side effect  Risk for fall due to: Comment - - -  Follow up Education provided;Falls prevention discussed -  Education provided;Falls evaluation completed;Follow up appointment;Falls prevention discussed   PHQ 2/9 Scores 08/03/2017 05/17/2017 03/10/2017 09/09/2015 03/18/2015 02/28/2015 01/25/2015  PHQ - 2 Score 0 0 0 _0 0  PHQ- 9 Score - - - 6 - 6 -    Assessment:   Met with Mary [name] at her apartment She is up walking around slowly Reports " Lord I have been sick lately"  She has visiting her today her grand daughters (x2) and one great grandson (infant less than 51 months old)  She has been coughing up white sputum.  The patient saw Dr Nevada Crane on 09/24/17 and was given antibiotics, prednisone tapering doses and OTC mucinex.  The prednisone 10 mg tapering dose 21 dose, OTC mucinex DM and antibiotics regimen continues. She was given  2 samples of medicine at her pcp office and a $2 coupon  She also reports she was instructed on 09/24/17 to take "three breathing treatments a day"  She reports her pcp asked her to return on 10/01/17 if she is not better. She reports  she "hope to be feeling better by then because I do not want to got to the hospital" She reports to Cm she is still not wanting to see her pulmonologist, Dr Luan Pulling. Mary [name] states Dr Nevada Crane is managing her respiratory/COPD issues. States she "slept for two days because I was so tired Has a new pet- cat "sylvester this is his first Reports she has not been near "sick people" except her great grand baby but baby was sick "weeks before me" Reports she has had the apartment complex staff clean out her vents and she is scheduled to have them check the vents and filters again in January 2019 Complained of apt complex leaf blowing issues with dirt on "my porch and windows"  Diabetes Mary [name] reports her a1c = 5 now, and K+ was low in her pcp office . CM unable to verify this A1c via [place] concern She reports her pcp saw "a spot on my kidney"   Pharmacy Cm had referred her to [place] in September 2018  calls from [facility] pharmacist who again was not able to make contact with Mary [name]. Reports she does not answer numbers she does not recognize.  Mary [name] reports she pays a [facility] fee to help with Rx She repots she received a letter from [place] her of a decrease in cost of her medication Reports with the decreased cost per [facility] for pradaxa and  januvia Pradaxa is prescribed by Dr Baldemar Lenis scent of burning candleis noted again today in home   Upcoming appointments 10/05/17 CT abdomen for  11/24/16 she reports she is to have a colonoscopy but need to schedule for her  mammogram   No falls since last home visit per ptatient   Discuss dehydration increase fluids   Bowel loud bowel sounds have been reported as buurping noted and flatus M Plan:  Given [facility] 2019 calendar given  Collaborated with Lennette Bihari [facility] pharmacist about medication concerns  Cm provided Lennette Bihari number to Mary Choyce and encouraged her to call him and to visit the [place] office for follow up assistance to the letter  received  Lennette Bihari requests to caution Mary Perfetti about the use of xanax related to dizziness . Encouraged to decrease her intake as recommend by the pharmacist   Joelene Millin L. Lavina Hamman, RN, BSN, [place] [facility] [phone]

## 2017-10-05 ENCOUNTER — Ambulatory Visit (HOSPITAL_COMMUNITY): Payer: Medicare Other

## 2017-10-28 ENCOUNTER — Other Ambulatory Visit: Payer: Self-pay | Admitting: *Deleted

## 2017-11-01 ENCOUNTER — Telehealth: Payer: Self-pay | Admitting: Cardiovascular Disease

## 2017-11-01 MED ORDER — APIXABAN 5 MG PO TABS: 5.0000 mg | ORAL_TABLET | Freq: Two times a day (BID) | ORAL | 11 refills | Status: DC

## 2017-11-01 NOTE — Telephone Encounter (Signed)
ok 

## 2017-11-01 NOTE — Telephone Encounter (Signed)
Left a message to call back.

## 2017-11-01 NOTE — Telephone Encounter (Signed)
New message   Pt c/o medication issue:  1. Name of Medication: PRADAXA 150 MG CAPS capsule  2. How are you currently taking this medication (dosage and times per day)? TAKE ONE CAPSULE BY MOUTH TWICE DAILY  3. Are you having a reaction (difficulty breathing--STAT)? No  4. What is your medication issue? Patient says that the medication has became too expensive and needs a cheaper alternative

## 2017-11-01 NOTE — Telephone Encounter (Signed)
Patient may transition back to warfarin. Please add to coumadin schedule (next available) if patient agree.

## 2017-11-01 NOTE — Telephone Encounter (Signed)
Returned the call to the patient. She stated that even with assistance the Pradaxa has gotten too expensive for her. It costs her $200 a bottle. She would like to be able to switch to a cheaper medication. Message routed to pharmd and the provider for their recommendation.

## 2017-11-01 NOTE — Telephone Encounter (Signed)
Patient willing to try  Eliquis 5mg  twice daily.  Rx sent to prefer pharmacy to verify cost. Patient aware it will take place of Pradaxa 150mg  twice daily

## 2017-11-01 NOTE — Telephone Encounter (Signed)
The patient stated that she had been on coumadin before and it made her feel sick. She would prefer to not take the medication again.

## 2017-11-11 DIAGNOSIS — J209 Acute bronchitis, unspecified: Secondary | ICD-10-CM | POA: Diagnosis not present

## 2017-11-15 ENCOUNTER — Emergency Department (HOSPITAL_COMMUNITY): Payer: Medicare Other

## 2017-11-15 ENCOUNTER — Inpatient Hospital Stay (HOSPITAL_COMMUNITY)
Admission: EM | Admit: 2017-11-15 | Discharge: 2017-11-17 | DRG: 193 | Disposition: A | Discharge status: Home Health | Payer: Medicare Other | Attending: Internal Medicine | Admitting: Internal Medicine

## 2017-11-15 ENCOUNTER — Encounter (HOSPITAL_COMMUNITY): Payer: Self-pay | Admitting: *Deleted

## 2017-11-15 ENCOUNTER — Other Ambulatory Visit: Payer: Self-pay

## 2017-11-15 DIAGNOSIS — Z7982 Long term (current) use of aspirin: Secondary | ICD-10-CM

## 2017-11-15 DIAGNOSIS — Z951 Presence of aortocoronary bypass graft: Secondary | ICD-10-CM

## 2017-11-15 DIAGNOSIS — R0602 Shortness of breath: Secondary | ICD-10-CM | POA: Diagnosis not present

## 2017-11-15 DIAGNOSIS — J441 Chronic obstructive pulmonary disease with (acute) exacerbation: Secondary | ICD-10-CM | POA: Diagnosis not present

## 2017-11-15 DIAGNOSIS — I4891 Unspecified atrial fibrillation: Secondary | ICD-10-CM | POA: Diagnosis not present

## 2017-11-15 DIAGNOSIS — Z87891 Personal history of nicotine dependence: Secondary | ICD-10-CM | POA: Diagnosis not present

## 2017-11-15 DIAGNOSIS — E1165 Type 2 diabetes mellitus with hyperglycemia: Secondary | ICD-10-CM | POA: Diagnosis present

## 2017-11-15 DIAGNOSIS — J9601 Acute respiratory failure with hypoxia: Secondary | ICD-10-CM | POA: Diagnosis present

## 2017-11-15 DIAGNOSIS — Z7951 Long term (current) use of inhaled steroids: Secondary | ICD-10-CM

## 2017-11-15 DIAGNOSIS — E785 Hyperlipidemia, unspecified: Secondary | ICD-10-CM

## 2017-11-15 DIAGNOSIS — Z8349 Family history of other endocrine, nutritional and metabolic diseases: Secondary | ICD-10-CM | POA: Diagnosis not present

## 2017-11-15 DIAGNOSIS — I110 Hypertensive heart disease with heart failure: Secondary | ICD-10-CM | POA: Diagnosis present

## 2017-11-15 DIAGNOSIS — I5032 Chronic diastolic (congestive) heart failure: Secondary | ICD-10-CM | POA: Diagnosis not present

## 2017-11-15 DIAGNOSIS — J181 Lobar pneumonia, unspecified organism: Secondary | ICD-10-CM | POA: Diagnosis not present

## 2017-11-15 DIAGNOSIS — R079 Chest pain, unspecified: Secondary | ICD-10-CM | POA: Diagnosis not present

## 2017-11-15 DIAGNOSIS — Z7901 Long term (current) use of anticoagulants: Secondary | ICD-10-CM | POA: Diagnosis not present

## 2017-11-15 DIAGNOSIS — J189 Pneumonia, unspecified organism: Secondary | ICD-10-CM | POA: Diagnosis not present

## 2017-11-15 DIAGNOSIS — R0902 Hypoxemia: Secondary | ICD-10-CM | POA: Diagnosis not present

## 2017-11-15 DIAGNOSIS — Z8249 Family history of ischemic heart disease and other diseases of the circulatory system: Secondary | ICD-10-CM | POA: Diagnosis not present

## 2017-11-15 DIAGNOSIS — F329 Major depressive disorder, single episode, unspecified: Secondary | ICD-10-CM | POA: Diagnosis present

## 2017-11-15 DIAGNOSIS — J440 Chronic obstructive pulmonary disease with acute lower respiratory infection: Secondary | ICD-10-CM | POA: Diagnosis not present

## 2017-11-15 DIAGNOSIS — T380X5A Adverse effect of glucocorticoids and synthetic analogues, initial encounter: Secondary | ICD-10-CM | POA: Diagnosis not present

## 2017-11-15 DIAGNOSIS — Z7984 Long term (current) use of oral hypoglycemic drugs: Secondary | ICD-10-CM

## 2017-11-15 DIAGNOSIS — Z833 Family history of diabetes mellitus: Secondary | ICD-10-CM | POA: Diagnosis not present

## 2017-11-15 LAB — BASIC METABOLIC PANEL
ANION GAP: 10 (ref 5–15)
BUN: 7 mg/dL (ref 6–20)
CHLORIDE: 96 mmol/L — AB (ref 101–111)
CO2: 29 mmol/L (ref 22–32)
Calcium: 9 mg/dL (ref 8.9–10.3)
Creatinine, Ser: 0.53 mg/dL (ref 0.44–1.00)
GFR calc Af Amer: >60 mL/min (ref >60)
GLUCOSE: 155 mg/dL — AB (ref 65–99)
POTASSIUM: 3.8 mmol/L (ref 3.5–5.1)
SODIUM: 135 mmol/L (ref 135–145)

## 2017-11-15 LAB — LACTIC ACID, PLASMA
LACTIC ACID, VENOUS: 1.2 mmol/L (ref 0.5–1.9)
LACTIC ACID, VENOUS: 1.5 mmol/L (ref 0.5–1.9)

## 2017-11-15 LAB — CBC WITH DIFFERENTIAL/PLATELET
Basophils Absolute: 0 10*3/uL (ref 0.0–0.1)
Basophils Relative: 0 %
EOS PCT: 0 %
Eosinophils Absolute: 0 10*3/uL (ref 0.0–0.7)
HCT: 35.3 % — ABNORMAL LOW (ref 36.0–46.0)
HEMOGLOBIN: 11.1 g/dL — AB (ref 12.0–15.0)
LYMPHS PCT: 16 %
Lymphs Abs: 1.9 10*3/uL (ref 0.7–4.0)
MCH: 28.3 pg (ref 26.0–34.0)
MCHC: 31.4 g/dL (ref 30.0–36.0)
MCV: 90.1 fL (ref 78.0–100.0)
MONO ABS: 1.1 10*3/uL — AB (ref 0.1–1.0)
Monocytes Relative: 9 %
NEUTROS PCT: 75 %
Neutro Abs: 9.1 10*3/uL — ABNORMAL HIGH (ref 1.7–7.7)
PLATELETS: 215 10*3/uL (ref 150–400)
RBC: 3.92 MIL/uL (ref 3.87–5.11)
RDW: 15.5 % (ref 11.5–15.5)
WBC: 12.1 10*3/uL — AB (ref 4.0–10.5)

## 2017-11-15 LAB — BRAIN NATRIURETIC PEPTIDE: B Natriuretic Peptide: 277 pg/mL — ABNORMAL HIGH (ref 0.0–100.0)

## 2017-11-15 LAB — GLUCOSE, CAPILLARY
GLUCOSE-CAPILLARY: 351 mg/dL — AB (ref 65–99)
Glucose-Capillary: 268 mg/dL — ABNORMAL HIGH (ref 65–99)

## 2017-11-15 LAB — TROPONIN I

## 2017-11-15 MED ORDER — ORAL CARE MOUTH RINSE
15.0000 mL | Freq: Two times a day (BID) | OROMUCOSAL | Status: DC
  Administered 2017-11-15 – 2017-11-17 (×3): 15 mL via OROMUCOSAL

## 2017-11-15 MED ORDER — LISINOPRIL 5 MG PO TABS
2.5000 mg | ORAL_TABLET | Freq: Every day | ORAL | Status: DC
  Administered 2017-11-15 – 2017-11-17 (×3): 2.5 mg via ORAL
  Filled 2017-11-15 (×3): qty 1

## 2017-11-15 MED ORDER — APIXABAN 5 MG PO TABS
5.0000 mg | ORAL_TABLET | Freq: Two times a day (BID) | ORAL | Status: DC
  Administered 2017-11-15 – 2017-11-17 (×4): 5 mg via ORAL
  Filled 2017-11-15 (×4): qty 1

## 2017-11-15 MED ORDER — DULOXETINE HCL 30 MG PO CPEP
30.0000 mg | ORAL_CAPSULE | Freq: Every day | ORAL | Status: DC
  Administered 2017-11-16 – 2017-11-17 (×2): 30 mg via ORAL
  Filled 2017-11-15 (×3): qty 1

## 2017-11-15 MED ORDER — HYDRALAZINE HCL 20 MG/ML IJ SOLN: 10.0000 mg | Freq: Three times a day (TID) | INTRAMUSCULAR | Status: DC | PRN

## 2017-11-15 MED ORDER — METOPROLOL TARTRATE 50 MG PO TABS
100.0000 mg | ORAL_TABLET | Freq: Every day | ORAL | Status: DC
  Administered 2017-11-16 – 2017-11-17 (×2): 100 mg via ORAL
  Filled 2017-11-15 (×2): qty 2

## 2017-11-15 MED ORDER — ACETYLCYSTEINE 20 % IN SOLN
3.0000 mL | Freq: Four times a day (QID) | RESPIRATORY_TRACT | Status: DC
  Administered 2017-11-16 (×2): 3 mL via RESPIRATORY_TRACT
  Filled 2017-11-15 (×2): qty 4

## 2017-11-15 MED ORDER — INSULIN ASPART 100 UNIT/ML ~~LOC~~ SOLN: 0.0000 [IU] | Freq: Three times a day (TID) | SUBCUTANEOUS | Status: DC

## 2017-11-15 MED ORDER — DIGOXIN 125 MCG PO TABS
0.1250 mg | ORAL_TABLET | Freq: Every day | ORAL | Status: DC
  Administered 2017-11-16 – 2017-11-17 (×2): 0.125 mg via ORAL
  Filled 2017-11-15 (×2): qty 1

## 2017-11-15 MED ORDER — INSULIN ASPART 100 UNIT/ML ~~LOC~~ SOLN
0.0000 [IU] | Freq: Three times a day (TID) | SUBCUTANEOUS | Status: DC
  Administered 2017-11-16: 5 [IU] via SUBCUTANEOUS
  Administered 2017-11-16: 3 [IU] via SUBCUTANEOUS

## 2017-11-15 MED ORDER — MAGNESIUM OXIDE 400 (241.3 MG) MG PO TABS
400.0000 mg | ORAL_TABLET | Freq: Every day | ORAL | Status: DC
  Administered 2017-11-15 – 2017-11-17 (×3): 400 mg via ORAL
  Filled 2017-11-15 (×3): qty 1

## 2017-11-15 MED ORDER — MONTELUKAST SODIUM 10 MG PO TABS
10.0000 mg | ORAL_TABLET | Freq: Every day | ORAL | Status: DC
  Filled 2017-11-15: qty 1

## 2017-11-15 MED ORDER — ALBUTEROL (5 MG/ML) CONTINUOUS INHALATION SOLN
10.0000 mg/h | INHALATION_SOLUTION | Freq: Once | RESPIRATORY_TRACT | Status: AC
  Administered 2017-11-15: 10 mg/h via RESPIRATORY_TRACT
  Filled 2017-11-15: qty 20

## 2017-11-15 MED ORDER — GLIPIZIDE ER 2.5 MG PO TB24
2.5000 mg | ORAL_TABLET | Freq: Every day | ORAL | Status: DC
  Filled 2017-11-15 (×2): qty 1

## 2017-11-15 MED ORDER — METHYLPREDNISOLONE SODIUM SUCC 125 MG IJ SOLR
125.0000 mg | Freq: Two times a day (BID) | INTRAMUSCULAR | Status: DC
  Administered 2017-11-16: 125 mg via INTRAVENOUS
  Filled 2017-11-15: qty 2

## 2017-11-15 MED ORDER — ALBUTEROL SULFATE (2.5 MG/3ML) 0.083% IN NEBU: 2.5000 mg | INHALATION_SOLUTION | RESPIRATORY_TRACT | Status: DC | PRN

## 2017-11-15 MED ORDER — ACETYLCYSTEINE 20 % IN SOLN: 3.0000 mg | Freq: Four times a day (QID) | RESPIRATORY_TRACT | Status: DC

## 2017-11-15 MED ORDER — ACETYLCYSTEINE 20 % IN SOLN
3.0000 mL | Freq: Four times a day (QID) | RESPIRATORY_TRACT | Status: DC
Start: 2017-11-15 — End: 2017-11-15
  Administered 2017-11-15: 1 mL via RESPIRATORY_TRACT
  Filled 2017-11-15: qty 4

## 2017-11-15 MED ORDER — IPRATROPIUM-ALBUTEROL 0.5-2.5 (3) MG/3ML IN SOLN: 3.0000 mL | Freq: Four times a day (QID) | RESPIRATORY_TRACT | Status: DC

## 2017-11-15 MED ORDER — ATORVASTATIN CALCIUM 40 MG PO TABS
40.0000 mg | ORAL_TABLET | Freq: Every day | ORAL | Status: DC
  Administered 2017-11-16 – 2017-11-17 (×2): 40 mg via ORAL
  Filled 2017-11-15 (×2): qty 1

## 2017-11-15 MED ORDER — DEXTROSE 5 % IV SOLN
1.0000 g | Freq: Once | INTRAVENOUS | Status: AC
  Administered 2017-11-15: 1 g via INTRAVENOUS
  Filled 2017-11-15: qty 10

## 2017-11-15 MED ORDER — INSULIN ASPART 100 UNIT/ML ~~LOC~~ SOLN
0.0000 [IU] | Freq: Every day | SUBCUTANEOUS | Status: DC
  Administered 2017-11-15: 5 [IU] via SUBCUTANEOUS

## 2017-11-15 MED ORDER — IPRATROPIUM BROMIDE 0.02 % IN SOLN
1.0000 mg | Freq: Once | RESPIRATORY_TRACT | Status: AC
  Administered 2017-11-15: 1 mg via RESPIRATORY_TRACT
  Filled 2017-11-15: qty 5

## 2017-11-15 MED ORDER — ASPIRIN EC 81 MG PO TBEC
81.0000 mg | DELAYED_RELEASE_TABLET | Freq: Every day | ORAL | Status: DC
  Administered 2017-11-15 – 2017-11-17 (×3): 81 mg via ORAL
  Filled 2017-11-15 (×3): qty 1

## 2017-11-15 MED ORDER — IPRATROPIUM-ALBUTEROL 0.5-2.5 (3) MG/3ML IN SOLN
3.0000 mL | Freq: Four times a day (QID) | RESPIRATORY_TRACT | Status: DC
  Administered 2017-11-15 – 2017-11-17 (×7): 3 mL via RESPIRATORY_TRACT
  Filled 2017-11-15 (×7): qty 3

## 2017-11-15 MED ORDER — AZITHROMYCIN 500 MG IV SOLR
500.0000 mg | INTRAVENOUS | Status: DC
  Administered 2017-11-16: 500 mg via INTRAVENOUS
  Filled 2017-11-15 (×2): qty 500

## 2017-11-15 MED ORDER — METOPROLOL TARTRATE 50 MG PO TABS
75.0000 mg | ORAL_TABLET | Freq: Every day | ORAL | Status: DC
  Administered 2017-11-15 – 2017-11-16 (×2): 75 mg via ORAL
  Filled 2017-11-15 (×2): qty 1

## 2017-11-15 MED ORDER — AZITHROMYCIN 500 MG IV SOLR
500.0000 mg | Freq: Once | INTRAVENOUS | Status: AC
  Administered 2017-11-15: 500 mg via INTRAVENOUS
  Filled 2017-11-15: qty 500

## 2017-11-15 MED ORDER — METHYLPREDNISOLONE SODIUM SUCC 125 MG IJ SOLR
125.0000 mg | Freq: Once | INTRAMUSCULAR | Status: AC
  Administered 2017-11-15: 125 mg via INTRAVENOUS
  Filled 2017-11-15: qty 2

## 2017-11-15 MED ORDER — KCL IN DEXTROSE-NACL 20-5-0.45 MEQ/L-%-% IV SOLN
INTRAVENOUS | Status: AC
  Administered 2017-11-15: 18:00:00 via INTRAVENOUS

## 2017-11-15 MED ORDER — DEXTROSE 5 % IV SOLN
1.0000 g | INTRAVENOUS | Status: DC
  Administered 2017-11-16: 1 g via INTRAVENOUS
  Filled 2017-11-15 (×2): qty 10

## 2017-11-15 NOTE — H&P (Signed)
[facility] History and Physical  Mary [name] KYH:[id number] DOB: 1951/06/14 DOA: 11/15/2017  Referring physician:  PCP: Celene Squibb, MD   Chief Complaint: "I have been coughing and I cannot get it up."  HPI: Mary [name] is a 66 y.o. female with past medical history for atrial fibrillation, asthma, heart failure, COPD, diabetes presents the emergency room with chief complaint of cough.  Patient was recently seen for same by her primary care doctor and prescribed steroids and antibiotics.  Patient has been ill for roughly 2 weeks.  After completing her medication from her doctor she is not feeling any better.  Nonproductive cough.  Has an audible "junky" cough.  Positive fevers and chills.  Positive for nausea.  Denies any vomiting or diarrhea.  Flu vaccine.  No pneumonia vaccine.  Has had decreased oral intake.  ED course: Chest x-ray showed pneumonia.  Patient started Rocephin and azithromycin.  Breathing treatments given.  Patient still requiring oxygen.  Was consulted for admission.   Review of Systems:  As per HPI otherwise 10 point review of systems negative.    Past Medical History:  Diagnosis Date  . Anxiety   . Arthritis   . Atrial fibrillation ([place])   . Bronchial asthma   . CHF (congestive heart failure) ([place])   . Chronic pain    Bacl pain, Disc L5-S1- Dr. Joya Salm in [place]  . COPD (chronic obstructive pulmonary disease) ([place])   . DDD (degenerative disc disease)    Radicular symptoms  . Diabetes mellitus   . History of stress test 06/2011   Abnormal myocardial perfusion study.  Marland Kitchen Hx of echocardiogram    Was interpreted by Dr Doylene Canard that showed an Ef in the 50-60% range with grade 1 diastolic dysfunction. she had moderate MR, biatrial enlargement, moderate TR and at that time estimated RV systolic pressure was 43 mm.  . Hyperlipidemia   . Hypertension   . Neuropathy   . Shortness of breath   . Tachycardia    Past Surgical History:  Procedure Laterality Date  .  ABDOMINAL HYSTERECTOMY     partial  . Breast cyst removed    . CARPAL TUNNEL RELEASE     x 2  . CORONARY ARTERY BYPASS GRAFT N/A 01/09/2014   Procedure: CORONARY ARTERY BYPASS GRAFTING (CABG) x 3 using endoscopically harvested right saphenous vein and left internal mammary artery and closure of left atrial appendage;  Surgeon: Ivin Poot, MD;  Location: [place];  Service: Open Heart Surgery;  Laterality: N/A;  patient has preop IA BP   . INTRAOPERATIVE TRANSESOPHAGEAL ECHOCARDIOGRAM N/A 01/09/2014   Procedure: INTRAOPERATIVE TRANSESOPHAGEAL ECHOCARDIOGRAM;  Surgeon: Ivin Poot, MD;  Location: [place];  Service: Open Heart Surgery;  Laterality: N/A;  . LEFT HEART CATHETERIZATION WITH CORONARY ANGIOGRAM N/A 01/07/2014   Procedure: LEFT HEART CATHETERIZATION WITH CORONARY ANGIOGRAM;  Surgeon: Lorretta Harp, MD;  Location[facility] C[facility] LAB;  Service: Cardiovascular;  Laterality: N/A;  . TONSILLECTOMY    . TUBAL LIGATION     Social History:  reports that she quit smoking about 4 years ago. Her smoking use included cigarettes. She has a 45.00 pack-year smoking history. she has never used smokeless tobacco. She reports that she does not drink alcohol or use drugs.  Allergies  Allergen Reactions  . Adhesive [Tape] Other (See Comments)    Tears skin off.   . Latex Other (See Comments)    In tape, tears skin off.  . Zocor [Simvastatin -  High Dose] Nausea And Vomiting    Family History  Problem Relation Age of Onset  . Diabetes Mother   . Heart disease Mother   . Diabetes Sister   . Hypertension Sister   . Hyperlipidemia Sister   . Diabetes Brother   . Heart disease Brother   . Diabetes Brother   . Heart disease Brother      Prior to Admission medications   Medication Sig Start Date End Date Taking? Authorizing Provider  ACCU-CHEK AVIVA PLUS test strip USE TO TEST 3 TO 4 TIMES DAILY OR AS DIRECTED. 07/20/16  Yes [place], Modena Nunnery, MD  acetaminophen (TYLENOL) 500 MG tablet Take 500 mg by  mouth every 6 (six) hours as needed for mild pain.   Yes [provider]  acetylcysteine (MUCOMYST) 20 % nebulizer solution Take 3 mLs by nebulization every 6 (six) hours. 02/24/17  Yes Eber Jones, MD  ALPRAZolam Duanne Moron) 1 MG tablet Take 1 mg by mouth 4 (four) times daily.  03/10/16  Yes [provider]  apixaban (ELIQUIS) 5 MG TABS tablet Take 1 tablet (5 mg total) by mouth 2 (two) times daily. Take place of Pradaxa 11/01/17  Yes Troy Sine, MD  aspirin EC 81 MG tablet Take 81 mg by mouth daily.   Yes [provider]  atorvastatin (LIPITOR) 40 MG tablet TAKE ONE TABLET BY MOUTH DAILY. 03/23/17  Yes Troy Sine, MD  DIGOX 125 MCG tablet TAKE (1) TABLET BY MOUTH ONCE DAILY. 08/06/17  Yes Troy Sine, MD  DULoxetine (CYMBALTA) 30 MG capsule Take 1 capsule by mouth daily. 11/01/17  Yes [provider]  fluticasone (FLONASE) 50 MCG/ACT nasal spray Place 2 sprays into both nostrils daily. 01/25/15  Yes [place], Modena Nunnery, MD  furosemide (LASIX) 40 MG tablet TAKE ONE TABLET BY MOUTH ONCE DAILY. 01/22/17  Yes Troy Sine, MD  gabapentin (NEURONTIN) 300 MG capsule Take 2 capsules by mouth 3 (three) times daily. 11/11/17  Yes [provider]  glipiZIDE (GLUCOTROL XL) 2.5 MG 24 hr tablet 2.5 mg daily.  02/28/16  Yes [provider]  JANUVIA 100 MG tablet TAKE ONE TABLET BY MOUTH DAILY. 07/19/15  Yes [place], Modena Nunnery, MD  levalbuterol Penne Lash) 0.63 MG/3ML nebulizer solution Take 3 mLs (0.63 mg total) by nebulization every 6 (six) hours. 02/24/17  Yes Eber Jones, MD  lisinopril (PRINIVIL,ZESTRIL) 2.5 MG tablet TAKE ONE TABLET BY MOUTH DAILY. 08/23/17  Yes Troy Sine, MD  Magnesium Oxide 250 MG TABS Take 250 mg by mouth daily.    Yes [provider]  metFORMIN (GLUCOPHAGE) 850 MG tablet TAKE 1 TABLET BY MOUTH TWICE DAILY WITH MEALS. 07/19/15  Yes , Modena Nunnery, MD  metoprolol tartrate (LOPRESSOR) 50 MG tablet TAKE 2  TABLETS BY MOUTH IN THE MORNING AND 1 AND 1/2 TABLETS AT BEDTIME. 08/23/17  Yes Troy Sine, MD  montelukast (SINGULAIR) 10 MG tablet Take 1 tablet by mouth daily. 01/21/17  Yes [provider]  Multiple Vitamin (MULTIVITAMIN) capsule Take 1 capsule by mouth daily.   Yes [provider]  potassium chloride SA (K-DUR,KLOR-CON) 20 MEQ tablet Take 1 tablet (20 mEq total) by mouth daily. 11/06/16  Yes Thurnell Lose, MD  sodium chloride (OCEAN) 0.65 % nasal spray Place 1 spray into the nose as needed for congestion. Congestion   Yes [provider]  SYMBICORT 160-4.5 MCG/ACT inhaler INHALE 2 PUFFS INTO THE LUNGS TWICE DAILY. RINSE MOUTH AFTER USE. 06/04/15  Yes Alycia Rossetti, MD   Physical Exam: Vitals:   11/15/17 1545 11/15/17 1600 11/15/17 1630 11/15/17 1700  BP:  113/71 111/64 (!) 110/51  Pulse: 99 (!) 103 (!) 103 (!) 102  Resp: (!) 27 (!) 21 (!) 23 (!) 22  Temp:    97.8 F (36.6 C)  TempSrc:    Oral  SpO2: 94% 93% 92% (!) 89%  Weight:    52.7 kg (116 lb 2.9 oz)  Height:        Wt Readings from Last 3 Encounters:  11/15/17 52.7 kg (116 lb 2.9 oz)  07/29/17 55.6 kg (122 lb 9.6 oz)  06/29/17 53.5 kg (118 lb)    General:  Appears calm and comfortable; dry mouth, decr skin turgor, A&Ox3 Eyes:  PERRL, EOMI, normal lids, iris ENT:  grossly normal hearing, lips & tongue Neck:  no LAD, masses or thyromegaly Cardiovascular:  IRR, IRR, no m/r/g. No LE edema.  Respiratory:  L crackles, incro wob, tachypnea Abdomen:  soft, LUQ TTP, ND Skin:  no rash or induration seen on limited exam Musculoskeletal:  grossly normal tone BUE/BLE Psychiatric:  grossly normal mood and affect, speech fluent and appropriate Neurologic:  CN 2-12 grossly intact, moves all extremities in coordinated fashion.          Labs on Admission:  Basic Metabolic Panel: Recent Labs  Lab 11/15/17 1146  NA 135  K 3.8  CL 96*  CO2 29  GLUCOSE 155*  BUN 7  CREATININE 0.53  CALCIUM  9.0   Liver Function Tests: No results for input(s): AST, ALT, ALKPHOS, BILITOT, PROT, ALBUMIN in the last 168 hours. No results for input(s): LIPASE, AMYLASE in the last 168 hours. No results for input(s): AMMONIA in the last 168 hours. CBC: Recent Labs  Lab 11/15/17 1146  WBC 12.1*  NEUTROABS 9.1*  HGB 11.1*  HCT 35.3*  MCV 90.1  PLT 215   Cardiac Enzymes: Recent Labs  Lab 11/15/17 1146  TROPONINI <0.03    BNP (last 3 results) Recent Labs    02/18/17 1106 11/15/17 1146  BNP 452.0* 277.0*    ProBNP (last 3 results) No results for input(s): PROBNP in the last 8760 hours.   Serum creatinine: 0.53 mg/dL 11/15/17 1146 Estimated creatinine clearance: 56 mL/min  CBG: Recent Labs  Lab 11/15/17 1725  GLUCAP 268*    Radiological Exams on Admission: Dg Chest 2 View  Result Date: 11/15/2017 CLINICAL DATA:  Shortness of breath and chest pain for 2 weeks. EXAM: CHEST  2 VIEW COMPARISON:  February 20, 2017 FINDINGS: The heart size and mediastinal contours are stable. There is patchy consolidation of the left mid lung. There is no pulmonary edema or pleural effusion. The visualized skeletal structures are stable. IMPRESSION: Left mid lung pneumonia. Follow-up after treatment is recommended to ensure complete resolution. Electronically Signed   By: Abelardo Diesel M.D.   On: 11/15/2017 12:40    EKG: Independently reviewed. Tachy, afib, RBBB  Assessment/Plan Active Problems:   Acute respiratory failure with hypoxia (HCC)  Acute Resp Failure with Hypoxia due to cap Presenting with outpatient treatment failure after on antibiotics and steroids PSI score 68, risk class 2 Scheduled DuoNeb's When necessary albuterol Antibiotic- rocephin and azithromycin Oxygen therapy Continuous pulse oximetry Flutter/IS Mucinex bid Cont daily mucomyst IV steroids Sputum cult Blood cult x2  Hypertension When necessary hydralazine 10 mg IV as needed for severe blood pressure Cont  lisinopril, opressor  Afib Cont Eliquis, digoxin  DM SSI AC Hold  oral meds  Hyperlipidemia Continue statin  Depression No SI/HI Cont cymbalat  Allergies Cont singulair  Medication Cost SW consult  Code Status: FC  DVT Prophylaxis: eliquis Family Communication: Friend at bedside, call Ernestine Conrad in emergency Disposition Plan: Pending Improvement  Status: tele inpt  Elwin Mocha, MD Family Medicine [facility] [email or web address] Password [email or web address]

## 2017-11-15 NOTE — ED Triage Notes (Signed)
Cough and chest pain for 8 days

## 2017-11-15 NOTE — Progress Notes (Addendum)
Pharmacy Note:  Initial antibiotic(s) regimen of Ceftriaxone and Azithromycin ordered by EDP to treat  CAP.  CrCl cannot be calculated (Unknown ideal weight.).   Allergies  Allergen Reactions  . Adhesive [Tape] Other (See Comments)    Tears skin off.   . Latex Other (See Comments)    In tape, tears skin off.  . Zocor [Simvastatin - High Dose] Nausea And Vomiting    Vitals:   11/15/17 1315 11/15/17 1401  BP:    Pulse: 95   Resp: (!) 21   Temp:    SpO2: 97% 91%    Anti-infectives (From admission, onward)   Start     Dose/Rate Route Frequency Ordered Stop   11/15/17 1600  cefTRIAXone (ROCEPHIN) 1 g in dextrose 5 % 50 mL IVPB     1 g 100 mL/hr over 30 Minutes Intravenous  Once 11/15/17 1554     11/15/17 1600  azithromycin (ZITHROMAX) 500 mg in dextrose 5 % 250 mL IVPB     500 mg 250 mL/hr over 60 Minutes Intravenous  Once 11/15/17 1554        Plan: Initial dose(s) of Azithromycin 500mg  and Ceftriaxone 1gm  X 1 ordered. F/U admission orders for further dosing if therapy continued.  Isac Sarna, BS [place], [place] Clinical Pharmacist Pager [phone] 11/15/2017 3:57 PM   Addendum: Continue Azithromycin 500mg  IV q24h Continue Ceftriaxone 1gm IV q4h Will sign off, please reconsult if needed Thanks,  Isac Sarna, BS [place], [place] Pharmacist Pager [phone]

## 2017-11-15 NOTE — ED Notes (Signed)
Pt ambulated. Pt O2 at 93% while dangling on bed. Ambulating, O2 sats dcreased and hovered in low 80s, 80% at the lowest.  Pt back in bed on 3L O2 via [place]

## 2017-11-15 NOTE — ED Provider Notes (Signed)
[facility] EMERGENCY DEPARTMENT Provider Note   CSN: [id number] Arrival date & time: 11/15/17  1139     History   Chief Complaint Chief Complaint  Patient presents with  . Chest Pain  . Cough    HPI Mary [name] is a 67 y.o. female.  HPI  Pt was seen at 1320.  Per pt, c/o gradual onset and worsening of persistent cough and SOB for the past 2 weeks. Has been associated with constant left sided chest pain. Pt states she was evaluated by her PMD 2 weeks ago for her symptoms, tx "cough pills," and "steroid" without improvement.  Pt states she no longer has O2 at home, "I sent it back because I don't need it."  Denies palpitations, no back pain, no abd pain, no N/V/D, no fevers, no rash.    Past Medical History:  Diagnosis Date  . Anxiety   . Arthritis   . Atrial fibrillation ([place])   . Bronchial asthma   . CHF (congestive heart failure) ([place])   . Chronic pain    Bacl pain, Disc L5-S1- Dr. Joya Salm in [place]  . COPD (chronic obstructive pulmonary disease) ([place])   . DDD (degenerative disc disease)    Radicular symptoms  . Diabetes mellitus   . History of stress test 06/2011   Abnormal myocardial perfusion study.  Marland Kitchen Hx of echocardiogram    Was interpreted by Dr Doylene Canard that showed an Ef in the 50-60% range with grade 1 diastolic dysfunction. she had moderate MR, biatrial enlargement, moderate TR and at that time estimated RV systolic pressure was 43 mm.  . Hyperlipidemia   . Hypertension   . Neuropathy   . Shortness of breath   . Tachycardia     Patient Active Problem List   Diagnosis Date Noted  . Malnutrition of moderate degree 02/20/2017  . Acute on chronic respiratory failure with hypoxia ([place]) 02/18/2017  . Hypomagnesemia 02/18/2017  . COPD with acute exacerbation ([place]) 11/04/2016  . Left carotid bruit 09/13/2015  . Noncompliance with medications 03/19/2015  . Gastroenteritis 03/19/2015  . Seasonal allergies 01/25/2015  . Chest pain 05/16/2014  . HTN (hypertension)  05/16/2014  . PSVT (paroxysmal supraventricular tachycardia) ([place]) 05/16/2014  . Acute diastolic CHF (congestive heart failure) ([place]) 05/15/2014  . CAD (coronary artery disease) 05/15/2014  . RBBB 04/25/2014  . Benign skin lesion 03/07/2014  . 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 Atrial fibrillation with rapid ventricular response ([place]) 01/06/2014  . Leukocytosis 01/06/2014  . Fall at home 09/18/2013  . Recurrent falls 09/18/2013  . Tinnitus of left ear 02/19/2013  . Diastolic congestive heart failure ([place]) 09/10/2012  . Atrial fibrillation ([place]) 09/10/2012  . DDD (degenerative disc disease), lumbar 08/26/2012  . Atypical mole 04/14/2012  . Seborrheic keratosis 04/14/2012  . Depression 12/09/2011  . COPD (chronic obstructive pulmonary disease) ([place]) 06/27/2011  . Anxiety 06/27/2011  . Chronic pain 05/27/2011  . Diabetes mellitus ([place]) 05/26/2011  . Hyperlipidemia 05/26/2011    Past Surgical History:  Procedure Laterality Date  . ABDOMINAL HYSTERECTOMY     partial  . Breast cyst removed    . CARPAL TUNNEL RELEASE     x 2  . CORONARY ARTERY BYPASS GRAFT N/A 01/09/2014   Procedure: CORONARY ARTERY BYPASS GRAFTING (CABG) x 3 using endoscopically harvested right saphenous vein and left internal mammary artery and closure of left atrial appendage;  Surgeon: Ivin Poot, MD;  Location: [place];  Service: Open Heart Surgery;  Laterality: N/A;  patient has preop IA BP   . INTRAOPERATIVE TRANSESOPHAGEAL ECHOCARDIOGRAM N/A 01/09/2014   Procedure: INTRAOPERATIVE TRANSESOPHAGEAL ECHOCARDIOGRAM;  Surgeon: Ivin Poot, MD;  Location: [place];  Service: Open Heart Surgery;  Laterality: N/A;  . LEFT HEART CATHETERIZATION WITH CORONARY ANGIOGRAM N/A 01/07/2014   Procedure: LEFT HEART CATHETERIZATION WITH CORONARY ANGIOGRAM;  Surgeon: Lorretta Harp, MD;  Location: [facility] CATH LAB;  Service: Cardiovascular;  Laterality: N/A;  . TONSILLECTOMY    . TUBAL LIGATION      OB History    Gravida Para Term  Preterm AB Living   1 1 1      0   SAB TAB Ectopic Multiple Live Births                   Home Medications    Prior to Admission medications   Medication Sig Start Date End Date Taking? Authorizing Provider  ACCU-CHEK AVIVA PLUS test strip USE TO TEST 3 TO 4 TIMES DAILY OR AS DIRECTED. 07/20/16   Alycia Rossetti, MD  acetaminophen (TYLENOL) 500 MG tablet Take 500 mg by mouth every 6 (six) hours as needed for mild pain.    [provider]  acetylcysteine (MUCOMYST) 20 % nebulizer solution Take 3 mLs by nebulization every 6 (six) hours. 02/24/17   Eber Jones, MD  ALPRAZolam Duanne Moron) 1 MG tablet Take 1 mg by mouth 4 (four) times daily.  03/10/16   [provider]  apixaban (ELIQUIS) 5 MG TABS tablet Take 1 tablet (5 mg total) by mouth 2 (two) times daily. Take place of Pradaxa 11/01/17   Troy Sine, MD  aspirin EC 81 MG tablet Take 81 mg by mouth daily.    [provider]  atorvastatin (LIPITOR) 40 MG tablet TAKE ONE TABLET BY MOUTH DAILY. 03/23/17   Troy Sine, MD  DIGOX 125 MCG tablet TAKE (1) TABLET BY MOUTH ONCE DAILY. 08/06/17   Troy Sine, MD  DULoxetine (CYMBALTA) 30 MG capsule Take 1 capsule by mouth daily. 11/01/17   [provider]  fluticasone (FLONASE) 50 MCG/ACT nasal spray Place 2 sprays into both nostrils daily. 01/25/15   Alycia Rossetti, MD  furosemide (LASIX) 40 MG tablet TAKE ONE TABLET BY MOUTH ONCE DAILY. 01/22/17   Troy Sine, MD  gabapentin (NEURONTIN) 300 MG capsule Take 2 capsules by mouth 3 (three) times daily. 11/11/17   [provider]  glipiZIDE (GLUCOTROL XL) 2.5 MG 24 hr tablet 2.5 mg daily.  02/28/16   [provider]  JANUVIA 100 MG tablet TAKE ONE TABLET BY MOUTH DAILY. 07/19/15   Franklin, Modena Nunnery, MD  levalbuterol Penne Lash) 0.63 MG/3ML nebulizer solution Take 3 mLs (0.63 mg total) by nebulization every 6 (six) hours. 02/24/17   Eber Jones, MD  lisinopril (PRINIVIL,ZESTRIL) 2.5  MG tablet TAKE ONE TABLET BY MOUTH DAILY. 08/23/17   Troy Sine, MD  Magnesium Oxide 250 MG TABS Take 250 mg by mouth daily.     [provider]  metFORMIN (GLUCOPHAGE) 850 MG tablet TAKE 1 TABLET BY MOUTH TWICE DAILY WITH MEALS. 07/19/15   Alycia Rossetti, MD  metoprolol tartrate (LOPRESSOR) 50 MG tablet TAKE 2 TABLETS BY MOUTH IN THE MORNING AND 1 AND 1/2 TABLETS AT BEDTIME. 08/23/17   Troy Sine, MD  montelukast (SINGULAIR) 10 MG tablet Take 1 tablet by mouth daily. 01/21/17   [provider]  Multiple Vitamin (MULTIVITAMIN) capsule Take 1 capsule by mouth daily.  [provider]  potassium chloride SA (K-DUR,KLOR-CON) 20 MEQ tablet Take 1 tablet (20 mEq total) by mouth daily. 11/06/16   Thurnell Lose, MD  sodium chloride (OCEAN) 0.65 % nasal spray Place 1 spray into the nose as needed for congestion. Congestion    [provider]  SYMBICORT 160-4.5 MCG/ACT inhaler INHALE 2 PUFFS INTO THE LUNGS TWICE DAILY. RINSE MOUTH AFTER USE. 06/04/15   Alycia Rossetti, MD    Family History Family History  Problem Relation Age of Onset  . Diabetes Mother   . Heart disease Mother   . Diabetes Sister   . Hypertension Sister   . Hyperlipidemia Sister   . Diabetes Brother   . Heart disease Brother   . Diabetes Brother   . Heart disease Brother     Social History Social History   Tobacco Use  . Smoking status: Former Smoker    Packs/day: 1.50    Years: 30.00    Pack years: 45.00    Types: Cigarettes    Last attempt to quit: 05/26/2013    Years since quitting: 4.4  . Smokeless tobacco: Never Used  . Tobacco comment: smells of heavy smoke 02/18/17  Substance Use Topics  . Alcohol use: No  . Drug use: No     Allergies   Adhesive [tape]; Latex; and Zocor [simvastatin - high dose]   Review of Systems Review of Systems ROS: Statement: All systems negative except as marked or noted in the HPI; Constitutional: Negative for fever and chills. ;  ; Eyes: Negative for eye pain, redness and discharge. ; ; ENMT: Negative for ear pain, hoarseness, nasal congestion, sinus pressure and sore throat. ; ; Cardiovascular: Negative for palpitations, diaphoresis, and peripheral edema. ; ; Respiratory: +SOB, cough. Negative for wheezing and stridor. ; ; Gastrointestinal: Negative for nausea, vomiting, diarrhea, abdominal pain, blood in stool, hematemesis, jaundice and rectal bleeding. . ; ; Genitourinary: Negative for dysuria, flank pain and hematuria. ; ; Musculoskeletal: +chest wall pain. Negative for back pain and neck pain. Negative for swelling and trauma.; ; Skin: Negative for pruritus, rash, abrasions, blisters, bruising and skin lesion.; ; Neuro: Negative for headache, lightheadedness and neck stiffness. Negative for weakness, altered level of consciousness, altered mental status, extremity weakness, paresthesias, involuntary movement, seizure and syncope.       Physical Exam Updated Vital Signs BP 123/63   Pulse 95   Temp 97.7 F (36.5 C) (Oral)   Resp (!) 21   Ht 5\' 7"  (1.702 m)   SpO2 97%   BMI 19.20 kg/m    Patient Vitals for the past 24 hrs:  BP Temp Temp src Pulse Resp SpO2 Height  11/15/17 1401 - - - - - 91 % -  11/15/17 1315 - - - 95 (!) 21 97 % -  11/15/17 1300 123/63 - - 100 (!) 29 94 % -  11/15/17 1245 - - - 91 (!) 24 93 % -  11/15/17 1230 (!) 106/56 - - 83 20 96 % -  11/15/17 1154 (!) 129/108 97.7 F (36.5 C) Oral 99 20 100 % -  11/15/17 1146 - - - - - - 5\' 7"  (1.702 m)     Physical Exam 1325: Physical examination:  Nursing notes reviewed; Vital signs and O2 SAT reviewed;  Constitutional: Well developed, Well nourished, Uncomfortable appearing; Head:  Normocephalic, atraumatic; Eyes: EOMI, PERRL, No scleral icterus; ENMT: Mouth and pharynx normal, Mucous membranes dry; Neck: Supple, Full range of motion, No lymphadenopathy; Cardiovascular:  Irregular rate and rhythm, No gallop; Respiratory: Breath sounds diminished and  coarse & equal bilaterally, scattered wheezes. No audible wheezing. +frequent coughing during exam. Speaking short sentences, sitting upright, mild tachypnea. Normal respiratory effort/excursion; Chest: Nontender, Movement normal; Abdomen: Soft, Nontender, Nondistended, Normal bowel sounds; Genitourinary: No CVA tenderness; Extremities: Pulses normal, No tenderness, No edema, No calf edema or asymmetry.; Neuro: AA&Ox3, Major CN grossly intact.  Speech clear. No gross focal motor or sensory deficits in extremities.; Skin: Color normal, Warm, Dry.   ED Treatments / Results  Labs (all labs ordered are listed, but only abnormal results are displayed)   EKG  EKG Interpretation  Date/Time:  Monday November 15 2017 11:52:09 EST Ventricular Rate:  100 PR Interval:    QRS Duration: 130 QT Interval:  326 QTC Calculation: 421 R Axis:   85 Text Interpretation:  Atrial fibrillation IVCD, consider atypical RBBB Probable lateral infarct, age indeterminate Baseline wander Artifact When compared with ECG of 02/18/2017 Rate slower Confirmed by Francine Graven [phone]) on 11/15/2017 1:56:03 PM       Radiology   Procedures Procedures (including critical care time)  Medications Ordered in ED Medications  albuterol (PROVENTIL,VENTOLIN) solution continuous neb (not administered)  ipratropium (ATROVENT) nebulizer solution 1 mg (not administered)  methylPREDNISolone sodium succinate (SOLU-MEDROL) 125 mg/2 mL injection 125 mg (125 mg Intravenous Given 11/15/17 1346)     Initial Impression / Assessment and Plan / ED Course  I have reviewed the triage vital signs and the nursing notes.  Pertinent labs & imaging results that were available during my care of the patient were reviewed by me and considered in my medical decision making (see chart for details).  MDM Reviewed: previous chart, nursing note and vitals Reviewed previous: labs and ECG Interpretation: labs, ECG and x-ray Total time providing  critical care: 30-74 minutes. This excludes time spent performing separately reportable procedures and services. Consults: admitting MD   CRITICAL CARE Performed by: Alfonzo Feller Total critical care time: 40 minutes Critical care time was exclusive of separately billable procedures and treating other patients. Critical care was necessary to treat or prevent imminent or life-threatening deterioration. Critical care was time spent personally by me on the following activities: development of treatment plan with patient and/or surrogate as well as nursing, discussions with consultants, evaluation of patient's response to treatment, examination of patient, obtaining history from patient or surrogate, ordering and performing treatments and interventions, ordering and review of laboratory studies, ordering and review of radiographic studies, pulse oximetry and re-evaluation of patient's condition.   Results for orders placed or performed during the hospital encounter of 11/15/17  CBC with Differential  Result Value Ref Range   WBC 12.1 (H) 4.0 - 10.5 K/uL   RBC 3.92 3.87 - 5.11 MIL/uL   Hemoglobin 11.1 (L) 12.0 - 15.0 g/dL   HCT 35.3 (L) 36.0 - 46.0 %   MCV 90.1 78.0 - 100.0 fL   MCH 28.3 26.0 - 34.0 pg   MCHC 31.4 30.0 - 36.0 g/dL   RDW 15.5 11.5 - 15.5 %   Platelets 215 150 - 400 K/uL   Neutrophils Relative % 75 %   Lymphocytes Relative 16 %   Monocytes Relative 9 %   Eosinophils Relative 0 %   Basophils Relative 0 %   Neutro Abs 9.1 (H) 1.7 - 7.7 K/uL   Lymphs Abs 1.9 0.7 - 4.0 K/uL   Monocytes Absolute 1.1 (H) 0.1 - 1.0 K/uL   Eosinophils Absolute 0.0 0.0 - 0.7 K/uL  Basophils Absolute 0.0 0.0 - 0.1 K/uL   WBC Morphology ATYPICAL LYMPHOCYTES   Basic metabolic panel  Result Value Ref Range   Sodium 135 135 - 145 mmol/L   Potassium 3.8 3.5 - 5.1 mmol/L   Chloride 96 (L) 101 - 111 mmol/L   CO2 29 22 - 32 mmol/L   Glucose, Bld 155 (H) 65 - 99 mg/dL   BUN 7 6 - 20 mg/dL    Creatinine, Ser 0.53 0.44 - 1.00 mg/dL   Calcium 9.0 8.9 - 10.3 mg/dL   GFR calc non Af Amer >60 >60 mL/min   GFR calc Af Amer >60 >60 mL/min   Anion gap 10 5 - 15  Troponin I  Result Value Ref Range   Troponin I <0.03 <0.03 ng/mL  Brain natriuretic peptide  Result Value Ref Range   B Natriuretic Peptide 277.0 (H) 0.0 - 100.0 pg/mL  Lactic acid, plasma  Result Value Ref Range   Lactic Acid, Venous 1.2 0.5 - 1.9 mmol/L   Dg Chest 2 View Result Date: 11/15/2017 CLINICAL DATA:  Shortness of breath and chest pain for 2 weeks. EXAM: CHEST  2 VIEW COMPARISON:  February 20, 2017 FINDINGS: The heart size and mediastinal contours are stable. There is patchy consolidation of the left mid lung. There is no pulmonary edema or pleural effusion. The visualized skeletal structures are stable. IMPRESSION: Left mid lung pneumonia. Follow-up after treatment is recommended to ensure complete resolution. Electronically Signed   By: Abelardo Diesel M.D.   On: 11/15/2017 12:40     1555:  On arrival: pt sitting upright, tachypneic, tachycardic, Sats 96 % on O2 2L N/C, lungs diminished, coarse with scattered wheezing. IV solumedrol and hour long neb started. After neb: pt appears more comfortable at rest, less tachypneic, Sats 97 % on O2 2L N/C, lungs continue diminished and coarse. Pt ambulated in hallway: pt's O2 Sats dropped to 80 % R/A with pt c/o increasing SOB, with increasing HR and RR. Pt escorted back to stretcher with O2 Sats slowly increasing to 91 % on O2 2L N/C. T/C to [facility] Dr. Aggie Moats, case discussed, including:  HPI, pertinent PM/SHx, VS/PE, dx testing, ED course and treatment:  Agreeable to admit.      Final Clinical Impressions(s) / ED Diagnoses   Final diagnoses:  None    ED Discharge Orders    None       Francine Graven, DO 11/17/17 1515

## 2017-11-16 ENCOUNTER — Encounter (HOSPITAL_COMMUNITY): Payer: Self-pay

## 2017-11-16 DIAGNOSIS — J181 Lobar pneumonia, unspecified organism: Principal | ICD-10-CM

## 2017-11-16 DIAGNOSIS — I5032 Chronic diastolic (congestive) heart failure: Secondary | ICD-10-CM

## 2017-11-16 LAB — CBC WITH DIFFERENTIAL/PLATELET
BASOS ABS: 0 10*3/uL (ref 0.0–0.1)
BASOS PCT: 0 %
EOS ABS: 0 10*3/uL (ref 0.0–0.7)
Eosinophils Relative: 0 %
HCT: 31.4 % — ABNORMAL LOW (ref 36.0–46.0)
HEMOGLOBIN: 9.9 g/dL — AB (ref 12.0–15.0)
Lymphocytes Relative: 13 %
Lymphs Abs: 0.8 10*3/uL (ref 0.7–4.0)
MCH: 28.4 pg (ref 26.0–34.0)
MCHC: 31.5 g/dL (ref 30.0–36.0)
MCV: 90 fL (ref 78.0–100.0)
MONOS PCT: 4 %
Monocytes Absolute: 0.2 10*3/uL (ref 0.1–1.0)
NEUTROS PCT: 83 %
Neutro Abs: 5.1 10*3/uL (ref 1.7–7.7)
Platelets: 205 10*3/uL (ref 150–400)
RBC: 3.49 MIL/uL — ABNORMAL LOW (ref 3.87–5.11)
RDW: 15.7 % — AB (ref 11.5–15.5)
WBC: 6.2 10*3/uL (ref 4.0–10.5)

## 2017-11-16 LAB — STREP PNEUMONIAE URINARY ANTIGEN: STREP PNEUMO URINARY ANTIGEN: NEGATIVE

## 2017-11-16 LAB — GLUCOSE, CAPILLARY
GLUCOSE-CAPILLARY: 241 mg/dL — AB (ref 65–99)
GLUCOSE-CAPILLARY: 197 mg/dL — AB (ref 65–99)
GLUCOSE-CAPILLARY: 273 mg/dL — AB (ref 65–99)
Glucose-Capillary: 288 mg/dL — ABNORMAL HIGH (ref 65–99)

## 2017-11-16 LAB — BASIC METABOLIC PANEL
ANION GAP: 10 (ref 5–15)
BUN: 12 mg/dL (ref 6–20)
CO2: 29 mmol/L (ref 22–32)
CREATININE: 0.44 mg/dL (ref 0.44–1.00)
Calcium: 8.7 mg/dL — ABNORMAL LOW (ref 8.9–10.3)
Chloride: 95 mmol/L — ABNORMAL LOW (ref 101–111)
Glucose, Bld: 278 mg/dL — ABNORMAL HIGH (ref 65–99)
Potassium: 4.2 mmol/L (ref 3.5–5.1)
SODIUM: 134 mmol/L — AB (ref 135–145)

## 2017-11-16 LAB — HIV ANTIBODY (ROUTINE TESTING W REFLEX): HIV SCREEN 4TH GENERATION: NONREACTIVE

## 2017-11-16 MED ORDER — BUDESONIDE 0.5 MG/2ML IN SUSP
0.5000 mg | Freq: Two times a day (BID) | RESPIRATORY_TRACT | Status: DC
  Administered 2017-11-16 – 2017-11-17 (×3): 0.5 mg via RESPIRATORY_TRACT
  Filled 2017-11-16 (×3): qty 2

## 2017-11-16 MED ORDER — ACETAMINOPHEN 325 MG PO TABS
650.0000 mg | ORAL_TABLET | Freq: Four times a day (QID) | ORAL | Status: DC | PRN
Start: 2017-11-16 — End: 2017-11-17
  Administered 2017-11-16 – 2017-11-17 (×2): 650 mg via ORAL
  Filled 2017-11-16 (×2): qty 2

## 2017-11-16 MED ORDER — INSULIN ASPART 100 UNIT/ML ~~LOC~~ SOLN
0.0000 [IU] | Freq: Every day | SUBCUTANEOUS | Status: DC
  Administered 2017-11-16: 3 [IU] via SUBCUTANEOUS

## 2017-11-16 MED ORDER — BUTALBITAL-APAP-CAFFEINE 50-325-40 MG PO TABS
1.0000 | ORAL_TABLET | Freq: Once | ORAL | Status: AC
  Administered 2017-11-16: 1 via ORAL
  Filled 2017-11-16: qty 1

## 2017-11-16 MED ORDER — INSULIN GLARGINE 100 UNIT/ML ~~LOC~~ SOLN
5.0000 [IU] | Freq: Every day | SUBCUTANEOUS | Status: DC
  Administered 2017-11-16 – 2017-11-17 (×2): 5 [IU] via SUBCUTANEOUS
  Filled 2017-11-16 (×3): qty 0.05

## 2017-11-16 MED ORDER — INSULIN ASPART 100 UNIT/ML ~~LOC~~ SOLN
0.0000 [IU] | Freq: Three times a day (TID) | SUBCUTANEOUS | Status: DC
  Administered 2017-11-16: 3 [IU] via SUBCUTANEOUS
  Administered 2017-11-17 (×2): 2 [IU] via SUBCUTANEOUS

## 2017-11-16 MED ORDER — PREDNISONE 20 MG PO TABS
50.0000 mg | ORAL_TABLET | Freq: Every day | ORAL | Status: DC
  Administered 2017-11-17: 10:00:00 50 mg via ORAL
  Filled 2017-11-16: qty 2

## 2017-11-16 MED ORDER — FUROSEMIDE 40 MG PO TABS
40.0000 mg | ORAL_TABLET | Freq: Every day | ORAL | Status: DC
  Administered 2017-11-17: 40 mg via ORAL
  Filled 2017-11-16: qty 1

## 2017-11-16 NOTE — Consult Note (Signed)
   [facility] CM Inpatient Consult   11/16/2017  Mary [name] [birth year] [phone]   Patient is currently active with [facility] for chronic disease management services.  Patient has been engaged by a [employer].  Our community based plan of care has focused on disease management and community resource support.  Patient will receive a post discharge transition of care call and will be evaluated for monthly home visits for assessments and disease process education. Of note, [facility] Care Management services does not replace or interfere with any services that are needed or arranged by inpatient case management or social work.  For additional questions or referrals please contact:  Kolette Vey RN, [facility] Liaison  [phone]) [place] [phone]) Toll free office

## 2017-11-16 NOTE — Care Management Note (Signed)
Case Management Note  Patient Details  Name: Mary [name] MRN: [id number] Date of Birth: 11/11/1950  Subjective/Objective:   Adm with respiratory failure. FRom home alone, has family support. Sent home last admission with HH and oxygen. Patient reports she returned oxygen. Has neb machine at home. Has RW, cane, BSC at home pta. PCP-Dr hall- friend drives her to appts.  She is active with [facility].               Action/Plan: DC home. CM following for needs. ? Oxygen needs again.   Expected Discharge Date:    11/18/2016              Expected Discharge Plan:     In-House Referral:     Discharge planning Services  CM Consult  Post Acute Care Choice:    Choice offered to:     DME Arranged:    DME Agency:     HH Arranged:    HH Agency:     Status of Service:  In process, will continue to follow  If discussed at Long Length of Stay Meetings, dates discussed:    Additional Comments:  Mazzie Brodrick, Chauncey Reading, RN 11/16/2017, 12:16 PM

## 2017-11-16 NOTE — Progress Notes (Signed)
Inpatient Diabetes Program Recommendations  AACE/ADA: New Consensus Statement on Inpatient Glycemic Control (2015)  Target Ranges:  Prepandial:   less than 140 mg/dL      Peak postprandial:   less than 180 mg/dL (1-2 hours)      Critically ill patients:  140 - 180 mg/dL   Results for Mary [name], Mary [name] (MRN [id number]) as of 11/16/2017 08:13  Ref. Range 11/15/2017 17:25 11/15/2017 21:41 11/16/2017 07:38  Glucose-Capillary Latest Ref Range: 65 - 99 mg/dL 268 (H) 351 (H) 241 (H)   Review of Glycemic Control  Diabetes history: DM2 Outpatient Diabetes medications: Metformin 850 mg BID, Januvia 100 mg daily  Current orders for Inpatient glycemic control: Novolog 0-9 units TID with meals, Novolog 0-5 units QHS; Solumedrol 125 mg Q12H  Inpatient Diabetes Program Recommendations: Insulin - Basal: If steroids are continued as ordered, please consider ordering Lantus 10 units Q24H starting now. Insulin - Meal Coverage: If steroids are continued as ordered, please consider ordering Novolog 3 units TID with meals for meal coverage.  Thanks, [name], RN, MSN, CDE Diabetes Coordinator Inpatient Diabetes Program [phone] (Team Pager from 8am to 5pm)

## 2017-11-16 NOTE — Progress Notes (Signed)
PROGRESS NOTE  Mary [name] TDH:[id number] DOB: 1951/08/28 DOA: 11/15/2017 PCP: Celene Squibb, MD  Brief History:  67 year old female with a history of diastolic CHF, COPD, diabetes mellitus, atrial fibrillation, hyperlipidemia, hypertension presenting with 2-week history of coughing with yellow sputum and shortness of breath.  She states that she saw her primary care provider approximately 2 weeks ago, and she was given "cough pills," and "steroid" without improvement. The patient denies any PND, orthopnea, worsening lower extremity edema.  She denies any fevers, chills, chest pain, nausea, vomiting, diarrhea, abdominal pain.  Patient states that she is to be on oxygen, but is no longer on at home.  Upon presentation, the patient was noted to have WBC 12.1 with chest x-ray that showed LLL opacity.  The patient was started on ceftriaxone and azithromycin.  Assessment/Plan: Acute respiratory failure with hypoxia  -secondary to pneumonia and COPD exacerbation -Wean oxygen back to room air for saturation greater than 92% -Pulmonary hygiene  Lobar pneumonia -Continue ceftriaxone and azithromycin -Urinary Streptococcus pneumoniae antigen -Urine Legionella antigen -Follow blood culture -Personally reviewed chest x-ray--L opacityLL  COPD exacerbation -wean IV steroids -continue Duonebs -start pulmicort  Atrial fibrillation, unspecified type -Continue apixaban -Rate controlled -Continue metoprolol and digoxin  Diabetes mellitus type 2, uncontrolled -NovoLog sliding scale--increase to moderate scale -Hemoglobin A1c -Holding metformin, glipizide and Januvia -start lantus 5 units daily while on steroids  Hyperlipidemia -Continue statin  Chronic diastolic CHF -6/46/8032 Echo EF 55-60%, no WMA -clinically euvolemic -continue home dose lasix   Disposition Plan:   Home in 1-2 days  Family Communication:   No Family at bedside  Consultants:  none  Code Status:  FULL  DVT  Prophylaxis:  apixaban   Procedures: As Listed in Progress Note Above  Antibiotics: Ceftriaxone 1/21>>> Azithromycin 1/21>>>    Subjective: Overall, patient is breathing better.  She continues to have a nonproductive cough.  She denies any nausea, vomiting, diarrhea, abdominal pain, dysuria, hematuria.  There is no headache or neck pain.  Objective: Vitals:   11/16/17 0127 11/16/17 0523 11/16/17 0749 11/16/17 0754  BP:  (!) 106/56    Pulse:  70    Resp:  16    Temp:  (!) 97.4 F (36.3 C)    TempSrc:  Oral    SpO2: 100% 99% 98% 100%  Weight:  52.2 kg (115 lb 1.3 oz)    Height:        Intake/Output Summary (Last 24 hours) at 11/16/2017 1220 Last data filed at 11/16/2017 0900 Gross per 24 hour  Intake 1476.25 ml  Output 900 ml  Net 576.25 ml   Weight change:  Exam:   General:  Pt is alert, follows commands appropriately, not in acute distress  HEENT: No icterus, No thrush, No neck mass, [place]/AT  Cardiovascular: IRRR, S1/S2, no rubs, no gallops  Respiratory: Bibasilar rales.  No wheezing.  Good air movement.  Abdomen: Soft/+BS, non tender, non distended, no guarding  Extremities: No edema, No lymphangitis, No petechiae, No rashes, no synovitis   Data Reviewed: I have personally reviewed following labs and imaging studies Basic Metabolic Panel: Recent Labs  Lab 11/15/17 1146 11/16/17 0446  NA 135 134*  K 3.8 4.2  CL 96* 95*  CO2 29 29  GLUCOSE 155* 278*  BUN 7 12  CREATININE 0.53 0.44  CALCIUM 9.0 8.7*   Liver Function Tests: No results for input(s): AST, ALT, ALKPHOS, BILITOT, PROT, ALBUMIN in the last  168 hours. No results for input(s): LIPASE, AMYLASE in the last 168 hours. No results for input(s): AMMONIA in the last 168 hours. Coagulation Profile: No results for input(s): INR, PROTIME in the last 168 hours. CBC: Recent Labs  Lab 11/15/17 1146 11/16/17 0446  WBC 12.1* 6.2  NEUTROABS 9.1* 5.1  HGB 11.1* 9.9*  HCT 35.3* 31.4*  MCV 90.1 90.0   PLT 215 205   Cardiac Enzymes: Recent Labs  Lab 11/15/17 1146  TROPONINI <0.03   BNP: Invalid input(s): POCBNP CBG: Recent Labs  Lab 11/15/17 1725 11/15/17 2141 11/16/17 0738 11/16/17 1142  GLUCAP 268* 351* 241* 288*   HbA1C: No results for input(s): HGBA1C in the last 72 hours. Urine analysis:    Component Value Date/Time   COLORURINE YELLOW 08/14/2016 [place] 08/14/2016 1343   LABSPEC 1.010 08/14/2016 1343   PHURINE 7.0 08/14/2016 1343   GLUCOSEU NEGATIVE 08/14/2016 1343   HGBUR NEGATIVE 08/14/2016 1343   BILIRUBINUR NEGATIVE 08/14/2016 1343   KETONESUR NEGATIVE 08/14/2016 1343   PROTEINUR NEGATIVE 08/14/2016 1343   UROBILINOGEN 0.2 02/17/2015 2040   NITRITE NEGATIVE 08/14/2016 1343   LEUKOCYTESUR NEGATIVE 08/14/2016 1343   Sepsis Labs: @LABRCNTIP (procalcitonin:4,lacticidven:4) ) Recent Results (from the past 240 hour(s))  Culture, blood (routine x 2) Call MD if unable to obtain prior to antibiotics being given     Status: None (Preliminary result)   Collection Time: 11/15/17  5:28 PM  Result Value Ref Range Status   Specimen Description BLOOD  Final   Special Requests NONE  Final   Culture NO GROWTH < 24 HOURS  Final   Report Status PENDING  Incomplete  Culture, blood (routine x 2) Call MD if unable to obtain prior to antibiotics being given     Status: None (Preliminary result)   Collection Time: 11/15/17  5:35 PM  Result Value Ref Range Status   Specimen Description BLOOD  Final   Special Requests NONE  Final   Culture NO GROWTH < 24 HOURS  Final   Report Status PENDING  Incomplete     Scheduled Meds: . acetylcysteine  3 mL Nebulization Q6H WA  . apixaban  5 mg Oral BID  . aspirin EC  81 mg Oral Daily  . atorvastatin  40 mg Oral Daily  . digoxin  0.125 mg Oral Daily  . DULoxetine  30 mg Oral Daily  . insulin aspart  0-5 Units Subcutaneous QHS  . insulin aspart  0-9 Units Subcutaneous TID WC  . ipratropium-albuterol  3 mL  Nebulization Q6H  . lisinopril  2.5 mg Oral Daily  . magnesium oxide  400 mg Oral Daily  . mouth rinse  15 mL Mouth Rinse BID  . methylPREDNISolone (SOLU-MEDROL) injection  125 mg Intravenous Q12H  . metoprolol tartrate  100 mg Oral Daily  . metoprolol tartrate  75 mg Oral QHS  . montelukast  10 mg Oral q1800   Continuous Infusions: . azithromycin    . cefTRIAXone (ROCEPHIN)  IV      Procedures/Studies: Dg Chest 2 View  Result Date: 11/15/2017 CLINICAL DATA:  Shortness of breath and chest pain for 2 weeks. EXAM: CHEST  2 VIEW COMPARISON:  February 20, 2017 FINDINGS: The heart size and mediastinal contours are stable. There is patchy consolidation of the left mid lung. There is no pulmonary edema or pleural effusion. The visualized skeletal structures are stable. IMPRESSION: Left mid lung pneumonia. Follow-up after treatment is recommended to ensure complete resolution. Electronically Signed  By: Abelardo Diesel M.D.   On: 11/15/2017 12:40    Orson Eva, DO  [facility] Pager [phone]  If 7PM-7AM, please contact night-coverage [email or web address] Password [email or web address] 11/16/2017, 12:20 PM   LOS: 1 day

## 2017-11-16 NOTE — Clinical Social Work Note (Signed)
Patient referred to CSW for medication cost issues. This is not a function of CSW. CM made aware of referral.   LCSW signing off.      Ambrose Pancoast D

## 2017-11-17 DIAGNOSIS — J9601 Acute respiratory failure with hypoxia: Secondary | ICD-10-CM

## 2017-11-17 DIAGNOSIS — J189 Pneumonia, unspecified organism: Secondary | ICD-10-CM

## 2017-11-17 DIAGNOSIS — I5032 Chronic diastolic (congestive) heart failure: Secondary | ICD-10-CM

## 2017-11-17 LAB — BASIC METABOLIC PANEL
ANION GAP: 10 (ref 5–15)
BUN: 12 mg/dL (ref 6–20)
CO2: 29 mmol/L (ref 22–32)
Calcium: 9 mg/dL (ref 8.9–10.3)
Chloride: 98 mmol/L — ABNORMAL LOW (ref 101–111)
Creatinine, Ser: 0.35 mg/dL — ABNORMAL LOW (ref 0.44–1.00)
GFR calc Af Amer: >60 mL/min (ref >60)
GLUCOSE: 146 mg/dL — AB (ref 65–99)
POTASSIUM: 4 mmol/L (ref 3.5–5.1)
Sodium: 137 mmol/L (ref 135–145)

## 2017-11-17 LAB — GLUCOSE, CAPILLARY
GLUCOSE-CAPILLARY: 130 mg/dL — AB (ref 65–99)
Glucose-Capillary: 135 mg/dL — ABNORMAL HIGH (ref 65–99)

## 2017-11-17 LAB — HEMOGLOBIN A1C
Hgb A1c MFr Bld: 6.3 % — ABNORMAL HIGH (ref 4.8–5.6)
Mean Plasma Glucose: 134.11 mg/dL

## 2017-11-17 MED ORDER — PREDNISONE 10 MG PO TABS: 10.0000 mg | ORAL_TABLET | Freq: Every day | ORAL | 0 refills | Status: DC

## 2017-11-17 MED ORDER — LEVOFLOXACIN 500 MG PO TABS: 500.0000 mg | ORAL_TABLET | Freq: Every day | ORAL | 0 refills | Status: AC

## 2017-11-17 NOTE — Discharge Summary (Signed)
Physician Discharge Summary  [name] QQI:[id number] DOB: Jan 09, 1951 DOA: 11/15/2017  PCP: Celene Squibb, MD  Admit date: 11/15/2017 Discharge date: 11/17/2017  Time spent: 45 minutes  Recommendations for Outpatient Follow-up:  -To be discharged home today. -Advised to follow-up with PCP in 2 weeks. -To complete 5 days of Levaquin for pneumonia.  Discharge Diagnoses:  Active Problems:   Atrial fibrillation (HCC)   Acute respiratory failure with hypoxia (HCC)   Lobar pneumonia (HCC)   Chronic diastolic CHF (congestive heart failure) ([place])   Discharge Condition: Stable and improved  Filed Weights   11/15/17 1700 11/16/17 0523 11/17/17 0536  Weight: 52.7 kg (116 lb 2.9 oz) 52.2 kg (115 lb 1.3 oz) 53.2 kg (117 lb 4.6 oz)    History of present illness:  As per Dr. Aggie Moats on 1/21: Mary [name] is a 67 y.o. female with past medical history for atrial fibrillation, asthma, heart failure, COPD, diabetes presents the emergency room with chief complaint of cough.  Patient was recently seen for same by her primary care doctor and prescribed steroids and antibiotics.  Patient has been ill for roughly 2 weeks.  After completing her medication from her doctor she is not feeling any better.  Nonproductive cough.  Has an audible "junky" cough.  Positive fevers and chills.  Positive for nausea.  Denies any vomiting or diarrhea.  Flu vaccine.  No pneumonia vaccine.  Has had decreased oral intake.  ED course: Chest x-ray showed pneumonia.  Patient started Rocephin and azithromycin.  Breathing treatments given.  Patient still requiring oxygen.  Was consulted for admission.    Hospital Course:   Acute respiratory failure with hypoxia  -secondary to pneumonia and COPD exacerbation -Wean oxygen back to room air for saturation greater than 92% -Pulmonary hygiene -Does not qualify for home oxygen.  Lobar pneumonia -Transition to Levaquin for an additional 5 days on discharge. -All culture  data remains negative to date.  COPD exacerbation -Continue prednisone taper. -Antibiotics as above.  Atrial fibrillation, unspecified type -Continue apixaban -Rate controlled -Continue metoprolol and digoxin  Diabetes mellitus type 2, uncontrolled -For management, CBGs a little elevated due to steroid use, suspect this will improve with steroid titration. -Continue outpatient management for her diabetes.  Hyperlipidemia -Continue statin  Chronic diastolic CHF -9/41/7408 Echo EF 55-60%, no WMA -clinically euvolemic -continue home dose lasix    Procedures:  None  Consultations:  None  Discharge Instructions  Discharge Instructions    Diet - low sodium heart healthy   Complete by:  As directed    Increase activity slowly   Complete by:  As directed      Allergies as of 11/17/2017      Reactions   Adhesive [tape] Other (See Comments)   Tears skin off.    Latex Other (See Comments)   In tape, tears skin off.   Zocor [simvastatin - High Dose] Nausea And Vomiting      Medication List    TAKE these medications   ACCU-CHEK AVIVA PLUS test strip Generic drug:  glucose blood USE TO TEST 3 TO 4 TIMES DAILY OR AS DIRECTED.   acetaminophen 500 MG tablet Commonly known as:  TYLENOL Take 500 mg by mouth every 6 (six) hours as needed for mild pain.   acetylcysteine 20 % nebulizer solution Commonly known as:  MUCOMYST Take 3 mLs by nebulization every 6 (six) hours.   ALPRAZolam 1 MG tablet Commonly known as:  XANAX Take 1 mg by  mouth 4 (four) times daily.   apixaban 5 MG Tabs tablet Commonly known as:  ELIQUIS Take 1 tablet (5 mg total) by mouth 2 (two) times daily. Take place of Pradaxa   aspirin EC 81 MG tablet Take 81 mg by mouth daily.   atorvastatin 40 MG tablet Commonly known as:  LIPITOR TAKE ONE TABLET BY MOUTH DAILY.   DIGOX 0.125 MG tablet Generic drug:  digoxin TAKE (1) TABLET BY MOUTH ONCE DAILY.   DULoxetine 30 MG capsule Commonly  known as:  CYMBALTA Take 1 capsule by mouth daily.   fluticasone 50 MCG/ACT nasal spray Commonly known as:  FLONASE Place 2 sprays into both nostrils daily.   furosemide 40 MG tablet Commonly known as:  LASIX TAKE ONE TABLET BY MOUTH ONCE DAILY.   gabapentin 300 MG capsule Commonly known as:  NEURONTIN Take 2 capsules by mouth 3 (three) times daily.   glipiZIDE 2.5 MG 24 hr tablet Commonly known as:  GLUCOTROL XL 2.5 mg daily.   JANUVIA 100 MG tablet Generic drug:  sitaGLIPtin TAKE ONE TABLET BY MOUTH DAILY.   levalbuterol 0.63 MG/3ML nebulizer solution Commonly known as:  XOPENEX Take 3 mLs (0.63 mg total) by nebulization every 6 (six) hours.   levofloxacin 500 MG tablet Commonly known as:  LEVAQUIN Take 1 tablet (500 mg total) by mouth daily for 5 days.   lisinopril 2.5 MG tablet Commonly known as:  PRINIVIL,ZESTRIL TAKE ONE TABLET BY MOUTH DAILY.   Magnesium Oxide 250 MG Tabs Take 250 mg by mouth daily.   metFORMIN 850 MG tablet Commonly known as:  GLUCOPHAGE TAKE 1 TABLET BY MOUTH TWICE DAILY WITH MEALS.   metoprolol tartrate 50 MG tablet Commonly known as:  LOPRESSOR TAKE 2 TABLETS BY MOUTH IN THE MORNING AND 1 AND 1/2 TABLETS AT BEDTIME.   montelukast 10 MG tablet Commonly known as:  SINGULAIR Take 1 tablet by mouth daily.   multivitamin capsule Take 1 capsule by mouth daily.   potassium chloride SA 20 MEQ tablet Commonly known as:  K-DUR,KLOR-CON Take 1 tablet (20 mEq total) by mouth daily.   predniSONE 10 MG tablet Commonly known as:  DELTASONE Take 1 tablet (10 mg total) by mouth daily with breakfast. Take 6 tablets today and then decrease by 1 tablet daily until none are left.   sodium chloride 0.65 % nasal spray Commonly known as:  OCEAN Place 1 spray into the nose as needed for congestion. Congestion   SYMBICORT 160-4.5 MCG/ACT inhaler Generic drug:  budesonide-formoterol INHALE 2 PUFFS INTO THE LUNGS TWICE DAILY. RINSE MOUTH AFTER USE.       Allergies  Allergen Reactions  . Adhesive [Tape] Other (See Comments)    Tears skin off.   . Latex Other (See Comments)    In tape, tears skin off.  . Zocor [Simvastatin - High Dose] Nausea And Vomiting   Follow-up Information    [facility] Follow up.   Why:  RN and PT Contact information: [address] [place] [phone]           The results of significant diagnostics from this hospitalization (including imaging, microbiology, ancillary and laboratory) are listed below for reference.    Significant Diagnostic Studies: Dg Chest 2 View  Result Date: 11/15/2017 CLINICAL DATA:  Shortness of breath and chest pain for 2 weeks. EXAM: CHEST  2 VIEW COMPARISON:  February 20, 2017 FINDINGS: The heart size and mediastinal contours are stable. There is patchy  consolidation of the left mid lung. There is no pulmonary edema or pleural effusion. The visualized skeletal structures are stable. IMPRESSION: Left mid lung pneumonia. Follow-up after treatment is recommended to ensure complete resolution. Electronically Signed   By: Abelardo Diesel M.D.   On: 11/15/2017 12:40    Microbiology: Recent Results (from the past 240 hour(s))  Culture, blood (routine x 2) Call MD if unable to obtain prior to antibiotics being given     Status: None (Preliminary result)   Collection Time: 11/15/17  5:28 PM  Result Value Ref Range Status   Specimen Description BLOOD LEFT ARM  Final   Special Requests   Final    BOTTLES DRAWN AEROBIC AND ANAEROBIC Blood Culture adequate volume   Culture NO GROWTH 2 DAYS  Final   Report Status PENDING  Incomplete  Culture, blood (routine x 2) Call MD if unable to obtain prior to antibiotics being given     Status: None (Preliminary result)   Collection Time: 11/15/17  5:35 PM  Result Value Ref Range Status   Specimen Description BLOOD LEFT ARM  Final   Special Requests   Final    BOTTLES DRAWN AEROBIC AND ANAEROBIC Blood  Culture adequate volume   Culture NO GROWTH 2 DAYS  Final   Report Status PENDING  Incomplete     Labs: Basic Metabolic Panel: Recent Labs  Lab 11/15/17 1146 11/16/17 0446 11/17/17 0557  NA 135 134* 137  K 3.8 4.2 4.0  CL 96* 95* 98*  CO2 29 29 29   GLUCOSE 155* 278* 146*  BUN 7 12 12   CREATININE 0.53 0.44 0.35*  CALCIUM 9.0 8.7* 9.0   Liver Function Tests: No results for input(s): AST, ALT, ALKPHOS, BILITOT, PROT, ALBUMIN in the last 168 hours. No results for input(s): LIPASE, AMYLASE in the last 168 hours. No results for input(s): AMMONIA in the last 168 hours. CBC: Recent Labs  Lab 11/15/17 1146 11/16/17 0446  WBC 12.1* 6.2  NEUTROABS 9.1* 5.1  HGB 11.1* 9.9*  HCT 35.3* 31.4*  MCV 90.1 90.0  PLT 215 205   Cardiac Enzymes: Recent Labs  Lab 11/15/17 1146  TROPONINI <0.03   BNP: BNP (last 3 results) Recent Labs    02/18/17 1106 11/15/17 1146  BNP 452.0* 277.0*    ProBNP (last 3 results) No results for input(s): PROBNP in the last 8760 hours.  CBG: Recent Labs  Lab 11/16/17 1142 11/16/17 1700 11/16/17 2037 11/17/17 0719 11/17/17 1108  GLUCAP 288* 197* 273* 135* 130*       Signed:  [facility] Pager: [phone] 11/17/2017, 6:37 PM

## 2017-11-17 NOTE — Care Management Note (Signed)
Case Management Note  Patient Details  Name: Mary [name] MRN: [id number] Date of Birth: 05/21/1951    Expected Discharge Date:  11/17/17               Expected Discharge Plan:  [place]  In-House Referral:     Discharge planning Services  CM Consult  Post Acute Care Choice:  Home Health Choice offered to:  Patient  DME Arranged:    DME Agency:     HH Arranged:  RN, PT [place] Agency:  [facility]  Status of Service:  Completed, signed off  If discussed at [place] of Stay Meetings, dates discussed:    Additional Comments:Patient discharging home today. She does not qualify for home oxygen, now on room air. She is agreeable to [facility]. Would like [facility]. [name] of [facility] care notified of DC and will obtain orders from Epic.   Malahki Gasaway, Chauncey Reading, RN 11/17/2017, 12:27 PM

## 2017-11-17 NOTE — Progress Notes (Signed)
Removed IV-clean, dry, intact. Reviewed d/c paperwork with pt and friend. Reviewed new prescriptions and home meds. Wheeled stable pt and belongings to main entrance where she was picked up by her friend.

## 2017-11-17 NOTE — Care Management Important Message (Signed)
Important Message  Patient Details  Name: Mary [name] MRN: [id number] Date of Birth: [birth year]   Medicare Important Message Given:  Yes    Latisa Belay, Chauncey Reading, RN 11/17/2017, 12:29 PM

## 2017-11-18 ENCOUNTER — Other Ambulatory Visit: Payer: Self-pay | Admitting: *Deleted

## 2017-11-18 DIAGNOSIS — I5032 Chronic diastolic (congestive) heart failure: Secondary | ICD-10-CM | POA: Diagnosis not present

## 2017-11-18 DIAGNOSIS — M6281 Muscle weakness (generalized): Secondary | ICD-10-CM | POA: Diagnosis not present

## 2017-11-18 DIAGNOSIS — J441 Chronic obstructive pulmonary disease with (acute) exacerbation: Secondary | ICD-10-CM | POA: Diagnosis not present

## 2017-11-18 DIAGNOSIS — Z7951 Long term (current) use of inhaled steroids: Secondary | ICD-10-CM | POA: Diagnosis not present

## 2017-11-18 DIAGNOSIS — Z7984 Long term (current) use of oral hypoglycemic drugs: Secondary | ICD-10-CM | POA: Diagnosis not present

## 2017-11-18 DIAGNOSIS — M1990 Unspecified osteoarthritis, unspecified site: Secondary | ICD-10-CM | POA: Diagnosis not present

## 2017-11-18 DIAGNOSIS — Z7982 Long term (current) use of aspirin: Secondary | ICD-10-CM | POA: Diagnosis not present

## 2017-11-18 DIAGNOSIS — I4891 Unspecified atrial fibrillation: Secondary | ICD-10-CM | POA: Diagnosis not present

## 2017-11-18 DIAGNOSIS — Z7901 Long term (current) use of anticoagulants: Secondary | ICD-10-CM | POA: Diagnosis not present

## 2017-11-18 DIAGNOSIS — Z87891 Personal history of nicotine dependence: Secondary | ICD-10-CM | POA: Diagnosis not present

## 2017-11-18 DIAGNOSIS — E785 Hyperlipidemia, unspecified: Secondary | ICD-10-CM | POA: Diagnosis not present

## 2017-11-18 DIAGNOSIS — I110 Hypertensive heart disease with heart failure: Secondary | ICD-10-CM | POA: Diagnosis not present

## 2017-11-18 DIAGNOSIS — Z9181 History of falling: Secondary | ICD-10-CM | POA: Diagnosis not present

## 2017-11-18 DIAGNOSIS — E119 Type 2 diabetes mellitus without complications: Secondary | ICD-10-CM | POA: Diagnosis not present

## 2017-11-18 LAB — LEGIONELLA PNEUMOPHILA SEROGP 1 UR AG: L. PNEUMOPHILA SEROGP 1 UR AG: NEGATIVE

## 2017-11-18 NOTE — Patient Outreach (Signed)
[facility]) Care Management   10/28/2017  Mary [name] 1951-01-10 [id number]  Mary [name] is an 67 y.o. female with a PMH of COPD now withhome O2 as needed; chronic systolic HF (EF 88-32% in 2015),Afibon Pradaxa; DM and anxiety presenting to the hospital on 02/18/17 with worsening SOB. She was treated with IV Lasix, steroids, and azithromycin. She confirmed in the hospital she still smokes occasional Cigarettes. Was admitted to SDU for COPD Exacerbation and placed on and weaned off of aBiPAP She was transferred to the medical floor on 02/21/17 HerHospitalization is complicated by uncontrolled hyperglycemia with steroid use. She was discharged on 02/24/17. Mary [name] has not been hospitalized cine 02/18/17 over 7 months ago.  This THN CM received areferral on 5/2/18from [name]., [facility] THNliaison, to engage for transition of care calls and evaluate for monthly home visits. Hospital dx COPD Exacerbation, Acute on chronic respiratory failure with hypoxia She was discharged on 02/24/17 home per hospital discharge summary with [facility] for HH[facility] and oxygen, pcp is Z Nevada Crane and Pulmonologist is E Hawkins for follow up care.   Today Cm is visiting Mary [name] to evaluate possible case closure  Subjective:  See notes below  Objective:   Review of Systems  Constitutional: Positive for malaise/fatigue. Negative for chills, diaphoresis, fever and weight loss.  HENT: Positive for congestion and sinus pain. Negative for ear discharge, ear pain, hearing loss, nosebleeds, sore throat and tinnitus.   Eyes: Positive for pain. Negative for blurred vision, double vision, photophobia, discharge and redness.  Respiratory: Positive for shortness of breath. Negative for stridor.   Cardiovascular: Positive for leg swelling. Negative for chest pain, palpitations, orthopnea, claudication and PND.  Gastrointestinal: Positive for abdominal pain and nausea. Negative for blood in stool,  constipation, diarrhea, heartburn, melena and vomiting.  Genitourinary: Negative.  Negative for dysuria, flank pain, frequency, hematuria and urgency.  Musculoskeletal: Positive for back pain, joint pain and myalgias. Negative for falls and neck pain.  Skin: Negative.  Negative for itching and rash.  Neurological: Positive for dizziness, weakness and headaches. Negative for tingling, tremors, sensory change, speech change, focal weakness, seizures and loss of consciousness.  Endo/Heme/Allergies: Positive for environmental allergies. Negative for polydipsia. Bruises/bleeds easily.  Psychiatric/Behavioral: Negative for depression, hallucinations, memory loss, substance abuse and suicidal ideas. The patient is not nervous/anxious and does not have insomnia.     Physical Exam  Constitutional: She is oriented to person, place, and time. She appears well-developed.  HENT:  Head: Normocephalic and atraumatic.  Eyes: Conjunctivae are normal. Pupils are equal, round, and reactive to light.  Neck: Normal range of motion. Neck supple.  Cardiovascular: Intact distal pulses.  Respiratory: Effort normal.  GI: Soft. There is tenderness.  Musculoskeletal: She exhibits edema and tenderness.  Neurological: She is alert and oriented to person, place, and time.  Skin: Skin is warm and dry.  Psychiatric: She has a normal mood and affect. Her behavior is normal. Judgment and thought content normal.    Encounter Medications:   Outpatient Encounter Medications as of 10/28/2017  Medication Sig Note  . ACCU-CHEK AVIVA PLUS test strip USE TO TEST 3 TO 4 TIMES DAILY OR AS DIRECTED.   Marland Kitchen acetaminophen (TYLENOL) 500 MG tablet Take 500 mg by mouth every 6 (six) hours as needed for mild pain.   Marland Kitchen acetylcysteine (MUCOMYST) 20 % nebulizer solution Take 3 mLs by nebulization every 6 (six) hours.   . ALPRAZolam (XANAX) 1 MG tablet Take 1 mg  by mouth 4 (four) times daily.    Marland Kitchen aspirin EC 81 MG tablet Take 81 mg by mouth  daily.   Marland Kitchen atorvastatin (LIPITOR) 40 MG tablet TAKE ONE TABLET BY MOUTH DAILY.   Marland Kitchen DIGOX 125 MCG tablet TAKE (1) TABLET BY MOUTH ONCE DAILY.   . fluticasone (FLONASE) 50 MCG/ACT nasal spray Place 2 sprays into both nostrils daily. 03/18/2015: Patient states she currently takes as needed for seasonal allergies.  Marland Kitchen glipiZIDE (GLUCOTROL XL) 2.5 MG 24 hr tablet 2.5 mg daily.    . Magnesium Oxide 250 MG TABS Take 250 mg by mouth daily.    . metFORMIN (GLUCOPHAGE) 850 MG tablet TAKE 1 TABLET BY MOUTH TWICE DAILY WITH MEALS.   . [DISCONTINUED] gabapentin (NEURONTIN) 100 MG capsule Take by mouth 3 (three) times daily.  06/29/2017: Now 300 mg neurontin since 06/17/17   . furosemide (LASIX) 40 MG tablet TAKE ONE TABLET BY MOUTH ONCE DAILY. 03/10/2017: Pt states she only take prn if legs swell along with potassium to prevent cramps  . JANUVIA 100 MG tablet TAKE ONE TABLET BY MOUTH DAILY.   Marland Kitchen levalbuterol (XOPENEX) 0.63 MG/3ML nebulizer solution Take 3 mLs (0.63 mg total) by nebulization every 6 (six) hours.   Marland Kitchen lisinopril (PRINIVIL,ZESTRIL) 2.5 MG tablet TAKE ONE TABLET BY MOUTH DAILY.   . metoprolol tartrate (LOPRESSOR) 50 MG tablet TAKE 2 TABLETS BY MOUTH IN THE MORNING AND 1 AND 1/2 TABLETS AT BEDTIME.   . montelukast (SINGULAIR) 10 MG tablet Take 1 tablet by mouth daily.   . Multiple Vitamin (MULTIVITAMIN) capsule Take 1 capsule by mouth daily.   . potassium chloride SA (K-DUR,KLOR-CON) 20 MEQ tablet Take 1 tablet (20 mEq total) by mouth daily.   . sodium chloride (OCEAN) 0.65 % nasal spray Place 1 spray into the nose as needed for congestion. Congestion   . SYMBICORT 160-4.5 MCG/ACT inhaler INHALE 2 PUFFS INTO THE LUNGS TWICE DAILY. RINSE MOUTH AFTER USE.   . [DISCONTINUED] PRADAXA 150 MG CAPS capsule TAKE ONE CAPSULE BY MOUTH TWICE DAILY.    No facility-administered encounter medications on file as of 10/28/2017.     Functional Status:   In your present state of health, do you have any difficulty  performing the following activities: 11/15/2017 03/10/2017  Hearing? N N  Vision? N N  Difficulty concentrating or making decisions? N N  Walking or climbing stairs? Y Y  Dressing or bathing? N N  Doing errands, shopping? N N  Preparing Food and eating ? - N  Using the Toilet? - N  In the past six months, have you accidently leaked urine? - N  Do you have problems with loss of bowel control? - N  Managing your Medications? - N  Managing your Finances? - N  Housekeeping or managing your Housekeeping? - N  Some recent data might be hidden    Fall/Depression Screening:    Fall Risk  08/03/2017 06/29/2017 05/17/2017  Falls in the past year? Yes Yes Yes  Number falls in past yr: 2 or more - 2 or more  Injury with Fall? No - Yes  Risk Factor Category  High Fall Risk - High Fall Risk  Risk for fall due to : History of fall(s);Impaired balance/gait;Medication side effect - History of fall(s);Impaired balance/gait;Medication side effect  Risk for fall due to: Comment - - -  Follow up Education provided;Falls prevention discussed - Education provided;Falls evaluation completed;Follow up appointment;Falls prevention discussed   PHQ 2/9 Scores 08/03/2017 05/17/2017 03/10/2017 09/09/2015  03/18/2015 02/28/2015 01/25/2015  PHQ - 2 Score 0 0 0 2 1 2  0  PHQ- 9 Score - - - 6 - 6 -    Assessment:    CM met with Mary [name] in her living room of her apartment.  Today she has a visitor and neighbor, Mary [name] visiting during the home visit per her permission.  Mary [name] is noted to be using her quad cane and reports hip and leg pain r/t "was sleeping all day long and just got up.  When I first get up it hurts until I start to move around." She reports she had been lying down today to keep her feet up but reported some nausea but no vomiting.  She reports she ate and took her medicines.  CM assessed the location of her reported pain and discussed sciatic nerve pain and general treatment that includes pain reliever, physical  therapy exercises, steroid injections, chiropractor services, etc.   COPD -Mary [name] reports use of her nebulizer this morning but continues with "coughing a lot more in the morning."  Cm encourage coughing up secretions "better out than in". Cm reviewed how to deep breathe and cough.  Mary [name] not sure if she has a spirometer in her home but will look for one. C/o pain of her sinuses especially with going from cold to warm environment.  Again today Cm notes a strong smell of burning candles in Mary [name]'s home and discusses it with her as possible irritant for respiratory issues. Mary [name] states she has been burning these scents to cover odors in the home for "a long time" and does not seem to understand that they may be irritants Mary [name] reports Migraine with some dizziness vs sinus issues just stared happening recently but Dr Nevada Crane is aware of All of these issues. "He is trying to prevent me from having to go be admitted to the hospital"  Complaint of pain of her left eye.  The left eye is not pink, red or draining secretions upon examination.  Cm reviewed pink eye symptoms and home care precautions  Medications- Januvia and pardaxa are still medications she is concerned about but she has not been or called to the [place] office as she has been instructed multiple times by [facility] CM and [facility] pharmacist to resolve the issues.  CM again told her to contact the [place] office to re-apply for medication assistance or to call Cv MD for office medication assistance.  Mary [name] confirms she is "not allowed" to take Tylenol.   Upcoming medical appointments- next primary care provider office visit to Dr Nevada Crane is in February 2019 and she reports he discussed giving her a tx plan if she continues to have issues with migraines Mary [name] reports she is scheduled to have a ct scan of the abdominal/pelvis area on 11/25/17 1400 Scheduled GI appointment with Marlynn Perking on 11/24/17 1345 at for anemia  Plan:  CM Introduced Mary [name]  to [facility] health coach services and case closure processes. CM will continue to monitor Mary Teodoro for 2-4 more weeks prior to closure related to respiratory issues.    Mary [name] L. Lavina Hamman, RN, BSN, [place] Care Management [phone]

## 2017-11-18 NOTE — Patient Outreach (Signed)
[place] [facility]) Care Management  11/18/2017  CHARLESIA CANADAY [birth year] [id number]   Transition of care services/Care coordination  On 11/16/17 CM noted Mrs Bown's admission alert on Epic and coordinated with [facility] hospital Liaison [name] about her admission and that [facility] CM was following Mrs Dier.  Mrs Wickes was monitor during her admission and she discussed [facility] CM interaction with her. She also mentioned medication cost issues with [name] but This CM and [facility] Pharmacist, Lennette Bihari have continuously encouraged Mrs Sager to complete interventions at the local SSI office and with her Cardiology without her follow up.   CM noted Mrs Delisi discharge summary In Epic for 11/17/17 . She was hospitalized from 11/15/17 to 11/17/17 to home with request to follow up with Dr hall in 2 weeks plus to complete her levaquin and prednisone for pneumonia and COPD exacerbation., She had presented to The ED on 1/21/9 with a cough extending for 2 weeks and had recently been seen by Dr hall.  She was found to have a "junky' audible cough with fevers, nausea, poor oral intake and chills. During the hospital stay she was treated with iv antibiotics, prednisone and oxygen but did not qualify at d/c for home oxygen use. Her afib and CHF was manageable but there were some elevations in cbgs related to prednisone use at hospital. She was discharged on a low sodium heart healthy diet and requested to increase activity slowly  Call #1 to home& mobile Number listed in EPIC  [place] CM left a HIPPA compliant voice message for  including THN CM mobile number for a return call.   Call #2 to alternate home Number listed in Epic - [phone] It rings without and answer and there is not voice mail box services available to leave a message   Plans Pending a possible return call back from Mrs Schiano Cm will attempt to call again within 1-3 business days   Kimberly L. Lavina Hamman, RN, BSN, [place] Care Management [phone]

## 2017-11-20 LAB — CULTURE, BLOOD (ROUTINE X 2)
CULTURE: NO GROWTH
Culture: NO GROWTH
Special Requests: ADEQUATE
Special Requests: ADEQUATE

## 2017-11-22 DIAGNOSIS — M1990 Unspecified osteoarthritis, unspecified site: Secondary | ICD-10-CM | POA: Diagnosis not present

## 2017-11-22 DIAGNOSIS — Z7982 Long term (current) use of aspirin: Secondary | ICD-10-CM | POA: Diagnosis not present

## 2017-11-22 DIAGNOSIS — I110 Hypertensive heart disease with heart failure: Secondary | ICD-10-CM | POA: Diagnosis not present

## 2017-11-22 DIAGNOSIS — M6281 Muscle weakness (generalized): Secondary | ICD-10-CM | POA: Diagnosis not present

## 2017-11-22 DIAGNOSIS — Z7901 Long term (current) use of anticoagulants: Secondary | ICD-10-CM | POA: Diagnosis not present

## 2017-11-22 DIAGNOSIS — I5032 Chronic diastolic (congestive) heart failure: Secondary | ICD-10-CM | POA: Diagnosis not present

## 2017-11-22 DIAGNOSIS — Z87891 Personal history of nicotine dependence: Secondary | ICD-10-CM | POA: Diagnosis not present

## 2017-11-22 DIAGNOSIS — J441 Chronic obstructive pulmonary disease with (acute) exacerbation: Secondary | ICD-10-CM | POA: Diagnosis not present

## 2017-11-22 DIAGNOSIS — E119 Type 2 diabetes mellitus without complications: Secondary | ICD-10-CM | POA: Diagnosis not present

## 2017-11-22 DIAGNOSIS — Z7984 Long term (current) use of oral hypoglycemic drugs: Secondary | ICD-10-CM | POA: Diagnosis not present

## 2017-11-22 DIAGNOSIS — E785 Hyperlipidemia, unspecified: Secondary | ICD-10-CM | POA: Diagnosis not present

## 2017-11-22 DIAGNOSIS — I4891 Unspecified atrial fibrillation: Secondary | ICD-10-CM | POA: Diagnosis not present

## 2017-11-22 DIAGNOSIS — Z7951 Long term (current) use of inhaled steroids: Secondary | ICD-10-CM | POA: Diagnosis not present

## 2017-11-22 DIAGNOSIS — Z9181 History of falling: Secondary | ICD-10-CM | POA: Diagnosis not present

## 2017-11-23 DIAGNOSIS — Z7984 Long term (current) use of oral hypoglycemic drugs: Secondary | ICD-10-CM | POA: Diagnosis not present

## 2017-11-23 DIAGNOSIS — M1990 Unspecified osteoarthritis, unspecified site: Secondary | ICD-10-CM | POA: Diagnosis not present

## 2017-11-23 DIAGNOSIS — M6281 Muscle weakness (generalized): Secondary | ICD-10-CM | POA: Diagnosis not present

## 2017-11-23 DIAGNOSIS — E119 Type 2 diabetes mellitus without complications: Secondary | ICD-10-CM | POA: Diagnosis not present

## 2017-11-23 DIAGNOSIS — Z7901 Long term (current) use of anticoagulants: Secondary | ICD-10-CM | POA: Diagnosis not present

## 2017-11-23 DIAGNOSIS — Z9181 History of falling: Secondary | ICD-10-CM | POA: Diagnosis not present

## 2017-11-23 DIAGNOSIS — Z7951 Long term (current) use of inhaled steroids: Secondary | ICD-10-CM | POA: Diagnosis not present

## 2017-11-23 DIAGNOSIS — I5032 Chronic diastolic (congestive) heart failure: Secondary | ICD-10-CM | POA: Diagnosis not present

## 2017-11-23 DIAGNOSIS — I4891 Unspecified atrial fibrillation: Secondary | ICD-10-CM | POA: Diagnosis not present

## 2017-11-23 DIAGNOSIS — E785 Hyperlipidemia, unspecified: Secondary | ICD-10-CM | POA: Diagnosis not present

## 2017-11-23 DIAGNOSIS — Z87891 Personal history of nicotine dependence: Secondary | ICD-10-CM | POA: Diagnosis not present

## 2017-11-23 DIAGNOSIS — I110 Hypertensive heart disease with heart failure: Secondary | ICD-10-CM | POA: Diagnosis not present

## 2017-11-23 DIAGNOSIS — J441 Chronic obstructive pulmonary disease with (acute) exacerbation: Secondary | ICD-10-CM | POA: Diagnosis not present

## 2017-11-23 DIAGNOSIS — Z7982 Long term (current) use of aspirin: Secondary | ICD-10-CM | POA: Diagnosis not present

## 2017-11-24 ENCOUNTER — Ambulatory Visit (INDEPENDENT_AMBULATORY_CARE_PROVIDER_SITE_OTHER): Payer: Medicare Other | Admitting: Internal Medicine

## 2017-11-24 ENCOUNTER — Telehealth: Payer: Self-pay | Admitting: *Deleted

## 2017-11-24 ENCOUNTER — Encounter (INDEPENDENT_AMBULATORY_CARE_PROVIDER_SITE_OTHER): Payer: Self-pay

## 2017-11-24 ENCOUNTER — Encounter (INDEPENDENT_AMBULATORY_CARE_PROVIDER_SITE_OTHER): Payer: Self-pay | Admitting: Internal Medicine

## 2017-11-24 NOTE — Patient Outreach (Signed)
[place] [facility]) Care Management  11/24/2017  Mary [name] 12/26/1950 [id number]   Care coordination   Late entry for 11/19/17 1659 THN CM left a HIPPA compliant voice message for  including THN CM mobile number for a return call.  11/24/17 0933 THN CM left a HIPPA compliant voice message for  including THN CM mobile number for a return call.   11/24/17 1012  Mary [name] returned a call to CM She informs CM she has been hospitalized for pneumonia.  CM updated her that CM had been notified. CM reviewed visit from hospital liaison with voiced medicatioins concerns to liaison and CM calls for transition of care services She voiced understanding  Began transition of care services assessment  She has not made a follow up appointment with Dr hall - Cm encouraged her to follow her discharge instructions and to make a follow up appointment and explained the purpose of hospital follow up visit with Dr Nevada Crane.  She reports she has not been out of the apartment related to the weather but will attempt to complete this task at a later time.  She denies having pulmonology follow up appointment.  Mary [name] ws seeing Dr Sinda Du in 2018 for pulmonology services and she discontinued her visits and returned her oxygen (related to cost of rental)  Home health Mary [name] confirms visit from [facility] home health RN and OT. "There has been so many in and out that I am getting confused."  Medications Mary [name] reports getting medications for discharge.  She was discharge on prednisone and Levaquin antibiotic plus her home medications She confirms she still has not made contact with her cardiologist nor SSI office to follow up on medication resources discussed with her by CM during last contact and visits prior to that.  She reports her Pradaxa ws changed to Eliquis but is still is "forty five dollars and that with my blood sugar medicine total ninety dollars" CM re emphasize the need to contact the cardiology  office via telephone to speak with her MD or MD RN about cost of Eliquis and Dr Nevada Crane or MD RN about cost of Januvia plus to contact or visit SSI office to re apply for medication assistance services as has been discussed previously. Cm discussed the importance of follow up from her related to these medications. She voiced understanding   Supports system Reports her friend "Mary [name]" is now sick and her grand daughter with a car is working Reminded Mary [name] of [facility] services availability   Upcoming appointments noted in Epic for  1/30.1 29 to see GI NP Setzer related to anemia  Plans To follow up with Mary [name] for transition of care services in 1 week  After 10/28/17 home visit Mary [name] was being evaluated for case closure but was admitted to the hospital on 11/15/17 and discharged on 11/17/17 therefore is now being followed for transition of care services To send another referral as agreed upon by Mary [name] to [place] for medication assistance Provided Mary [name] with contact number for [facility] pharmacist K Reudinger. She states she wrote the number down. Encouraged Mary [name] to answer Mr Reudinger's calls or to call him  Route note to Primary care provider, and all other MDs listed in care team Epic list   Joelene Millin L. Lavina Hamman, RN, BSN, [place] Coordinator [phone] week day mobile

## 2017-11-25 ENCOUNTER — Ambulatory Visit (HOSPITAL_COMMUNITY): Payer: Medicare Other

## 2017-11-25 DIAGNOSIS — J441 Chronic obstructive pulmonary disease with (acute) exacerbation: Secondary | ICD-10-CM | POA: Diagnosis not present

## 2017-11-25 DIAGNOSIS — Z87891 Personal history of nicotine dependence: Secondary | ICD-10-CM | POA: Diagnosis not present

## 2017-11-25 DIAGNOSIS — E119 Type 2 diabetes mellitus without complications: Secondary | ICD-10-CM | POA: Diagnosis not present

## 2017-11-25 DIAGNOSIS — M6281 Muscle weakness (generalized): Secondary | ICD-10-CM | POA: Diagnosis not present

## 2017-11-25 DIAGNOSIS — M1990 Unspecified osteoarthritis, unspecified site: Secondary | ICD-10-CM | POA: Diagnosis not present

## 2017-11-25 DIAGNOSIS — Z9181 History of falling: Secondary | ICD-10-CM | POA: Diagnosis not present

## 2017-11-25 DIAGNOSIS — I4891 Unspecified atrial fibrillation: Secondary | ICD-10-CM | POA: Diagnosis not present

## 2017-11-25 DIAGNOSIS — Z7984 Long term (current) use of oral hypoglycemic drugs: Secondary | ICD-10-CM | POA: Diagnosis not present

## 2017-11-25 DIAGNOSIS — E785 Hyperlipidemia, unspecified: Secondary | ICD-10-CM | POA: Diagnosis not present

## 2017-11-25 DIAGNOSIS — Z7982 Long term (current) use of aspirin: Secondary | ICD-10-CM | POA: Diagnosis not present

## 2017-11-25 DIAGNOSIS — Z7951 Long term (current) use of inhaled steroids: Secondary | ICD-10-CM | POA: Diagnosis not present

## 2017-11-25 DIAGNOSIS — I5032 Chronic diastolic (congestive) heart failure: Secondary | ICD-10-CM | POA: Diagnosis not present

## 2017-11-25 DIAGNOSIS — I110 Hypertensive heart disease with heart failure: Secondary | ICD-10-CM | POA: Diagnosis not present

## 2017-11-25 DIAGNOSIS — Z7901 Long term (current) use of anticoagulants: Secondary | ICD-10-CM | POA: Diagnosis not present

## 2017-11-30 DIAGNOSIS — Z7982 Long term (current) use of aspirin: Secondary | ICD-10-CM | POA: Diagnosis not present

## 2017-11-30 DIAGNOSIS — Z7984 Long term (current) use of oral hypoglycemic drugs: Secondary | ICD-10-CM | POA: Diagnosis not present

## 2017-11-30 DIAGNOSIS — E785 Hyperlipidemia, unspecified: Secondary | ICD-10-CM | POA: Diagnosis not present

## 2017-11-30 DIAGNOSIS — Z7951 Long term (current) use of inhaled steroids: Secondary | ICD-10-CM | POA: Diagnosis not present

## 2017-11-30 DIAGNOSIS — Z9181 History of falling: Secondary | ICD-10-CM | POA: Diagnosis not present

## 2017-11-30 DIAGNOSIS — J441 Chronic obstructive pulmonary disease with (acute) exacerbation: Secondary | ICD-10-CM | POA: Diagnosis not present

## 2017-11-30 DIAGNOSIS — M1990 Unspecified osteoarthritis, unspecified site: Secondary | ICD-10-CM | POA: Diagnosis not present

## 2017-11-30 DIAGNOSIS — E119 Type 2 diabetes mellitus without complications: Secondary | ICD-10-CM | POA: Diagnosis not present

## 2017-11-30 DIAGNOSIS — I110 Hypertensive heart disease with heart failure: Secondary | ICD-10-CM | POA: Diagnosis not present

## 2017-11-30 DIAGNOSIS — I4891 Unspecified atrial fibrillation: Secondary | ICD-10-CM | POA: Diagnosis not present

## 2017-11-30 DIAGNOSIS — M6281 Muscle weakness (generalized): Secondary | ICD-10-CM | POA: Diagnosis not present

## 2017-11-30 DIAGNOSIS — Z87891 Personal history of nicotine dependence: Secondary | ICD-10-CM | POA: Diagnosis not present

## 2017-11-30 DIAGNOSIS — Z7901 Long term (current) use of anticoagulants: Secondary | ICD-10-CM | POA: Diagnosis not present

## 2017-11-30 DIAGNOSIS — I5032 Chronic diastolic (congestive) heart failure: Secondary | ICD-10-CM | POA: Diagnosis not present

## 2017-12-01 DIAGNOSIS — M6281 Muscle weakness (generalized): Secondary | ICD-10-CM | POA: Diagnosis not present

## 2017-12-01 DIAGNOSIS — E785 Hyperlipidemia, unspecified: Secondary | ICD-10-CM | POA: Diagnosis not present

## 2017-12-01 DIAGNOSIS — E119 Type 2 diabetes mellitus without complications: Secondary | ICD-10-CM | POA: Diagnosis not present

## 2017-12-01 DIAGNOSIS — Z7982 Long term (current) use of aspirin: Secondary | ICD-10-CM | POA: Diagnosis not present

## 2017-12-01 DIAGNOSIS — I110 Hypertensive heart disease with heart failure: Secondary | ICD-10-CM | POA: Diagnosis not present

## 2017-12-01 DIAGNOSIS — M1990 Unspecified osteoarthritis, unspecified site: Secondary | ICD-10-CM | POA: Diagnosis not present

## 2017-12-01 DIAGNOSIS — Z7951 Long term (current) use of inhaled steroids: Secondary | ICD-10-CM | POA: Diagnosis not present

## 2017-12-01 DIAGNOSIS — Z87891 Personal history of nicotine dependence: Secondary | ICD-10-CM | POA: Diagnosis not present

## 2017-12-01 DIAGNOSIS — J441 Chronic obstructive pulmonary disease with (acute) exacerbation: Secondary | ICD-10-CM | POA: Diagnosis not present

## 2017-12-01 DIAGNOSIS — I5032 Chronic diastolic (congestive) heart failure: Secondary | ICD-10-CM | POA: Diagnosis not present

## 2017-12-01 DIAGNOSIS — Z9181 History of falling: Secondary | ICD-10-CM | POA: Diagnosis not present

## 2017-12-01 DIAGNOSIS — Z7984 Long term (current) use of oral hypoglycemic drugs: Secondary | ICD-10-CM | POA: Diagnosis not present

## 2017-12-01 DIAGNOSIS — I4891 Unspecified atrial fibrillation: Secondary | ICD-10-CM | POA: Diagnosis not present

## 2017-12-01 DIAGNOSIS — Z7901 Long term (current) use of anticoagulants: Secondary | ICD-10-CM | POA: Diagnosis not present

## 2017-12-02 DIAGNOSIS — Z9181 History of falling: Secondary | ICD-10-CM | POA: Diagnosis not present

## 2017-12-02 DIAGNOSIS — Z7982 Long term (current) use of aspirin: Secondary | ICD-10-CM | POA: Diagnosis not present

## 2017-12-02 DIAGNOSIS — E785 Hyperlipidemia, unspecified: Secondary | ICD-10-CM | POA: Diagnosis not present

## 2017-12-02 DIAGNOSIS — I5032 Chronic diastolic (congestive) heart failure: Secondary | ICD-10-CM | POA: Diagnosis not present

## 2017-12-02 DIAGNOSIS — M6281 Muscle weakness (generalized): Secondary | ICD-10-CM | POA: Diagnosis not present

## 2017-12-02 DIAGNOSIS — M1990 Unspecified osteoarthritis, unspecified site: Secondary | ICD-10-CM | POA: Diagnosis not present

## 2017-12-02 DIAGNOSIS — Z7951 Long term (current) use of inhaled steroids: Secondary | ICD-10-CM | POA: Diagnosis not present

## 2017-12-02 DIAGNOSIS — Z7984 Long term (current) use of oral hypoglycemic drugs: Secondary | ICD-10-CM | POA: Diagnosis not present

## 2017-12-02 DIAGNOSIS — Z87891 Personal history of nicotine dependence: Secondary | ICD-10-CM | POA: Diagnosis not present

## 2017-12-02 DIAGNOSIS — Z7901 Long term (current) use of anticoagulants: Secondary | ICD-10-CM | POA: Diagnosis not present

## 2017-12-02 DIAGNOSIS — J441 Chronic obstructive pulmonary disease with (acute) exacerbation: Secondary | ICD-10-CM | POA: Diagnosis not present

## 2017-12-02 DIAGNOSIS — I110 Hypertensive heart disease with heart failure: Secondary | ICD-10-CM | POA: Diagnosis not present

## 2017-12-02 DIAGNOSIS — E119 Type 2 diabetes mellitus without complications: Secondary | ICD-10-CM | POA: Diagnosis not present

## 2017-12-02 DIAGNOSIS — I4891 Unspecified atrial fibrillation: Secondary | ICD-10-CM | POA: Diagnosis not present

## 2017-12-06 ENCOUNTER — Other Ambulatory Visit: Payer: Self-pay | Admitting: Pharmacist

## 2017-12-06 NOTE — Patient Outreach (Signed)
[facility]) Care Management  12/06/2017  Mary [name] 1951/01/26 [id number]  Patient referred to [place] Pharmacist by [facility] RN [name] for medication patient assistance evaluation.   Unsuccessful phone outreach to patient.  HIPAA compliant message left requesting return call.   Plan:  If no return call, will make second outreach attempt in the next week.   Karrie Meres, PharmD, [place] [phone]

## 2017-12-07 ENCOUNTER — Ambulatory Visit: Payer: Self-pay | Admitting: Pharmacist

## 2017-12-07 DIAGNOSIS — I5032 Chronic diastolic (congestive) heart failure: Secondary | ICD-10-CM | POA: Diagnosis not present

## 2017-12-07 DIAGNOSIS — E785 Hyperlipidemia, unspecified: Secondary | ICD-10-CM | POA: Diagnosis not present

## 2017-12-07 DIAGNOSIS — I110 Hypertensive heart disease with heart failure: Secondary | ICD-10-CM | POA: Diagnosis not present

## 2017-12-07 DIAGNOSIS — Z7984 Long term (current) use of oral hypoglycemic drugs: Secondary | ICD-10-CM | POA: Diagnosis not present

## 2017-12-07 DIAGNOSIS — J441 Chronic obstructive pulmonary disease with (acute) exacerbation: Secondary | ICD-10-CM | POA: Diagnosis not present

## 2017-12-07 DIAGNOSIS — Z7901 Long term (current) use of anticoagulants: Secondary | ICD-10-CM | POA: Diagnosis not present

## 2017-12-07 DIAGNOSIS — Z87891 Personal history of nicotine dependence: Secondary | ICD-10-CM | POA: Diagnosis not present

## 2017-12-07 DIAGNOSIS — M1990 Unspecified osteoarthritis, unspecified site: Secondary | ICD-10-CM | POA: Diagnosis not present

## 2017-12-07 DIAGNOSIS — Z7951 Long term (current) use of inhaled steroids: Secondary | ICD-10-CM | POA: Diagnosis not present

## 2017-12-07 DIAGNOSIS — I4891 Unspecified atrial fibrillation: Secondary | ICD-10-CM | POA: Diagnosis not present

## 2017-12-07 DIAGNOSIS — M6281 Muscle weakness (generalized): Secondary | ICD-10-CM | POA: Diagnosis not present

## 2017-12-07 DIAGNOSIS — E119 Type 2 diabetes mellitus without complications: Secondary | ICD-10-CM | POA: Diagnosis not present

## 2017-12-07 DIAGNOSIS — Z9181 History of falling: Secondary | ICD-10-CM | POA: Diagnosis not present

## 2017-12-07 DIAGNOSIS — Z7982 Long term (current) use of aspirin: Secondary | ICD-10-CM | POA: Diagnosis not present

## 2017-12-08 DIAGNOSIS — M1990 Unspecified osteoarthritis, unspecified site: Secondary | ICD-10-CM | POA: Diagnosis not present

## 2017-12-08 DIAGNOSIS — E785 Hyperlipidemia, unspecified: Secondary | ICD-10-CM | POA: Diagnosis not present

## 2017-12-08 DIAGNOSIS — Z7984 Long term (current) use of oral hypoglycemic drugs: Secondary | ICD-10-CM | POA: Diagnosis not present

## 2017-12-08 DIAGNOSIS — Z87891 Personal history of nicotine dependence: Secondary | ICD-10-CM | POA: Diagnosis not present

## 2017-12-08 DIAGNOSIS — J441 Chronic obstructive pulmonary disease with (acute) exacerbation: Secondary | ICD-10-CM | POA: Diagnosis not present

## 2017-12-08 DIAGNOSIS — Z9181 History of falling: Secondary | ICD-10-CM | POA: Diagnosis not present

## 2017-12-08 DIAGNOSIS — I4891 Unspecified atrial fibrillation: Secondary | ICD-10-CM | POA: Diagnosis not present

## 2017-12-08 DIAGNOSIS — Z7951 Long term (current) use of inhaled steroids: Secondary | ICD-10-CM | POA: Diagnosis not present

## 2017-12-08 DIAGNOSIS — I110 Hypertensive heart disease with heart failure: Secondary | ICD-10-CM | POA: Diagnosis not present

## 2017-12-08 DIAGNOSIS — I5032 Chronic diastolic (congestive) heart failure: Secondary | ICD-10-CM | POA: Diagnosis not present

## 2017-12-08 DIAGNOSIS — E119 Type 2 diabetes mellitus without complications: Secondary | ICD-10-CM | POA: Diagnosis not present

## 2017-12-08 DIAGNOSIS — M6281 Muscle weakness (generalized): Secondary | ICD-10-CM | POA: Diagnosis not present

## 2017-12-08 DIAGNOSIS — Z7982 Long term (current) use of aspirin: Secondary | ICD-10-CM | POA: Diagnosis not present

## 2017-12-08 DIAGNOSIS — Z7901 Long term (current) use of anticoagulants: Secondary | ICD-10-CM | POA: Diagnosis not present

## 2017-12-10 DIAGNOSIS — E119 Type 2 diabetes mellitus without complications: Secondary | ICD-10-CM | POA: Diagnosis not present

## 2017-12-10 DIAGNOSIS — M1990 Unspecified osteoarthritis, unspecified site: Secondary | ICD-10-CM | POA: Diagnosis not present

## 2017-12-10 DIAGNOSIS — Z9181 History of falling: Secondary | ICD-10-CM | POA: Diagnosis not present

## 2017-12-10 DIAGNOSIS — Z7951 Long term (current) use of inhaled steroids: Secondary | ICD-10-CM | POA: Diagnosis not present

## 2017-12-10 DIAGNOSIS — Z7982 Long term (current) use of aspirin: Secondary | ICD-10-CM | POA: Diagnosis not present

## 2017-12-10 DIAGNOSIS — Z7984 Long term (current) use of oral hypoglycemic drugs: Secondary | ICD-10-CM | POA: Diagnosis not present

## 2017-12-10 DIAGNOSIS — I110 Hypertensive heart disease with heart failure: Secondary | ICD-10-CM | POA: Diagnosis not present

## 2017-12-10 DIAGNOSIS — I4891 Unspecified atrial fibrillation: Secondary | ICD-10-CM | POA: Diagnosis not present

## 2017-12-10 DIAGNOSIS — Z87891 Personal history of nicotine dependence: Secondary | ICD-10-CM | POA: Diagnosis not present

## 2017-12-10 DIAGNOSIS — I5032 Chronic diastolic (congestive) heart failure: Secondary | ICD-10-CM | POA: Diagnosis not present

## 2017-12-10 DIAGNOSIS — Z7901 Long term (current) use of anticoagulants: Secondary | ICD-10-CM | POA: Diagnosis not present

## 2017-12-10 DIAGNOSIS — E785 Hyperlipidemia, unspecified: Secondary | ICD-10-CM | POA: Diagnosis not present

## 2017-12-10 DIAGNOSIS — J441 Chronic obstructive pulmonary disease with (acute) exacerbation: Secondary | ICD-10-CM | POA: Diagnosis not present

## 2017-12-10 DIAGNOSIS — M6281 Muscle weakness (generalized): Secondary | ICD-10-CM | POA: Diagnosis not present

## 2017-12-13 ENCOUNTER — Other Ambulatory Visit: Payer: Self-pay | Admitting: Pharmacist

## 2017-12-13 NOTE — Patient Outreach (Signed)
[place] [facility]) Care Management  12/13/2017  CAMMY SANJURJO 1951/04/09 [id number]  Second outreach attempt to patient successful.  HIPAA details verified and purpose of call explained to patient.  Patient reports she had just woken up, offered to call back later, she states she will be busy today and tomorrow, but would like call back later in the week.   Plan:  Will make outreach attempt to patient later in the week per her request.   Karrie Meres, PharmD, [place] [phone]

## 2017-12-14 DIAGNOSIS — I959 Hypotension, unspecified: Secondary | ICD-10-CM | POA: Diagnosis not present

## 2017-12-14 DIAGNOSIS — G4709 Other insomnia: Secondary | ICD-10-CM | POA: Diagnosis not present

## 2017-12-14 DIAGNOSIS — E1140 Type 2 diabetes mellitus with diabetic neuropathy, unspecified: Secondary | ICD-10-CM | POA: Diagnosis not present

## 2017-12-14 DIAGNOSIS — J9601 Acute respiratory failure with hypoxia: Secondary | ICD-10-CM | POA: Diagnosis not present

## 2017-12-15 ENCOUNTER — Ambulatory Visit: Payer: Self-pay | Admitting: Pharmacist

## 2017-12-16 ENCOUNTER — Other Ambulatory Visit: Payer: Self-pay | Admitting: Pharmacist

## 2017-12-16 NOTE — Patient Outreach (Signed)
[place] [facility]) Care Management  12/16/2017  Mary [name] 01/16/1951 [id number]  Unsuccessful phone outreach to patient to follow-up on her request for assistance with medication cost.  HIPAA compliant message left requesting return call.   Plan:  Will make second phone outreach attempt in the next week.   Karrie Meres, PharmD, [place] [phone]

## 2017-12-17 ENCOUNTER — Other Ambulatory Visit: Payer: Self-pay | Admitting: Pharmacist

## 2017-12-17 NOTE — Patient Outreach (Signed)
[place] [facility]) Care Management  [name]   12/17/2017  Mary [name] 07-03-1951 [id number]  Subjective:  Successful outreach attempt to patient.  She was referred to [place] Pharmacist by [facility] RN Kim for medication patient assistance evaluation.   Patient verified HIPAA details and reports her medications of concern are Eliquis, Symbicort, ProAir, and Januvia.  Per patient, Eliquis is written by Dr Claiborne Billings, Symbicort, ProAir, and Januvia are written by Dr Nevada Crane.    Patient has a past medication history significant for:  Atrial fibrillation (on oral anti-coagulation), hypertension, COPD, hyperlipidemia, diabetes mellitus, depression, CAD.    Of note, she was discharged from [facility] on 11/17/17 following pneumonia.    Objective:   Current Medications: Current Outpatient Medications  Medication Sig Dispense Refill  . albuterol (PROVENTIL HFA;VENTOLIN HFA) 108 (90 Base) MCG/ACT inhaler Inhale 1-2 puffs into the lungs every 6 (six) hours as needed for shortness of breath.    . ALPRAZolam (XANAX) 1 MG tablet Take 1 mg by mouth 4 (four) times daily.     Marland Kitchen apixaban (ELIQUIS) 5 MG TABS tablet Take 1 tablet (5 mg total) by mouth 2 (two) times daily. Take place of Pradaxa 60 tablet 11  . aspirin EC 81 MG tablet Take 81 mg by mouth daily.    Marland Kitchen atorvastatin (LIPITOR) 40 MG tablet TAKE ONE TABLET BY MOUTH DAILY. 90 tablet 3  . DIGOX 125 MCG tablet TAKE (1) TABLET BY MOUTH ONCE DAILY. 30 tablet 11  . DULoxetine (CYMBALTA) 30 MG capsule Take 1 capsule by mouth daily.    . fluticasone (FLONASE) 50 MCG/ACT nasal spray Place 2 sprays into both nostrils daily. 16 g 6  . furosemide (LASIX) 40 MG tablet TAKE ONE TABLET BY MOUTH ONCE DAILY. 30 tablet 6  . gabapentin (NEURONTIN) 300 MG capsule Take 2 capsules by mouth 3 (three) times daily.    Marland Kitchen glipiZIDE (GLUCOTROL XL) 2.5 MG 24 hr tablet 2.5 mg daily.     Marland Kitchen JANUVIA 100 MG tablet TAKE ONE TABLET BY MOUTH DAILY. 30 tablet 0  .  levalbuterol (XOPENEX) 0.63 MG/3ML nebulizer solution Take 3 mLs (0.63 mg total) by nebulization every 6 (six) hours. 3 mL 0  . lisinopril (PRINIVIL,ZESTRIL) 2.5 MG tablet TAKE ONE TABLET BY MOUTH DAILY. 30 tablet 10  . Magnesium Oxide 250 MG TABS Take 250 mg by mouth daily.     . metFORMIN (GLUCOPHAGE) 850 MG tablet TAKE 1 TABLET BY MOUTH TWICE DAILY WITH MEALS. 60 tablet 0  . metoprolol tartrate (LOPRESSOR) 50 MG tablet TAKE 2 TABLETS BY MOUTH IN THE MORNING AND 1 AND 1/2 TABLETS AT BEDTIME. 105 tablet 9  . montelukast (SINGULAIR) 10 MG tablet Take 1 tablet by mouth daily.    . Multiple Vitamin (MULTIVITAMIN) capsule Take 1 capsule by mouth daily.    . potassium chloride SA (K-DUR,KLOR-CON) 20 MEQ tablet Take 1 tablet (20 mEq total) by mouth daily. 30 tablet 0  . SYMBICORT 160-4.5 MCG/ACT inhaler INHALE 2 PUFFS INTO THE LUNGS TWICE DAILY. RINSE MOUTH AFTER USE. 10.2 g 0  . ACCU-CHEK AVIVA PLUS test strip USE TO TEST 3 TO 4 TIMES DAILY OR AS DIRECTED. 100 each PRN  . acetaminophen (TYLENOL) 500 MG tablet Take 500 mg by mouth every 6 (six) hours as needed for mild pain.    Marland Kitchen acetylcysteine (MUCOMYST) 20 % nebulizer solution Take 3 mLs by nebulization every 6 (six) hours. (Patient not taking: Reported on 12/17/2017) 30 mL 0  .  sodium chloride (OCEAN) 0.65 % nasal spray Place 1 spray into the nose as needed for congestion. Congestion     No current facility-administered medications for this visit.     Functional Status: In your present state of health, do you have any difficulty performing the following activities: 11/15/2017 03/10/2017  Hearing? N N  Vision? N N  Difficulty concentrating or making decisions? N N  Walking or climbing stairs? Y Y  Dressing or bathing? N N  Doing errands, shopping? N N  Preparing Food and eating ? - N  Using the Toilet? - N  In the past six months, have you accidently leaked urine? - N  Do you have problems with loss of bowel control? - N  Managing your  Medications? - N  Managing your Finances? - N  Housekeeping or managing your Housekeeping? - N  Some recent data might be hidden    Fall/Depression Screening: Fall Risk  08/03/2017 06/29/2017 05/17/2017  Falls in the past year? Yes Yes Yes  Number falls in past yr: 2 or more - 2 or more  Injury with Fall? No - Yes  Risk Factor Category  High Fall Risk - High Fall Risk  Risk for fall due to : History of fall(s);Impaired balance/gait;Medication side effect - History of fall(s);Impaired balance/gait;Medication side effect  Risk for fall due to: Comment - - -  Follow up Education provided;Falls prevention discussed - Education provided;Falls evaluation completed;Follow up appointment;Falls prevention discussed   PHQ 2/9 Scores 08/03/2017 05/17/2017 03/10/2017 09/09/2015 03/18/2015 02/28/2015 01/25/2015  PHQ - 2 Score 0 0 0 2 1 2  0  PHQ- 9 Score - - - 6 - 6 -    ASSESSMENT: Date Discharged from Hospital: 11/17/17 Date Medication Reconciliation Performed: 12/17/2017   New Medications at Discharge:   Levofloxacin 500 mg for 5 days (course completed)   Prednisone (course completed)  Patient was recently discharged from hospital and all medications have been reviewed   Drugs sorted by system:  Neurologic/Psychologic: -alprazolam  -duloxetine -gabapentin   Cardiovascular: -apixaban  -aspirin 81 mg -atorvastatin  -digoxin  -furosemide -lisinopril  -potassium chloride  -metoprolol tartrate   Pulmonary/Allergy: -acetylcysteine (patient reports no longer using)  -Symbicort  -fluticasone nasal spray  -levalbuterol nebs -Albuterol inhaler -montelukast   Endocrine: -glipizide  -metformin  -sitagliptin   Pain: -acetaminophen   Vitamins/Minerals: -magnesium oxide -multivitamin    Medications to avoid in the elderly:  Increased risk of CNS effects with concomitant use of alprazolam and gabapentin  Medication patient assistance:  -Discussed Social Security Extra  Help---patient reports income exceeds requirements.   -[employer] patient assistance (Januvia and Proventil)---patient is interested in applying---she is aware there is no guarantee she will be eligible.   -Eliquis and Symbicort patient assistance---these manufacturers require Part D beneficiaries spend at least 3% of annual income on out-of-pocket prescription drug costs in calendar year---patient does not believe she met this yet.    PLAN: Patient counseled to take medications as prescribed.  Will work with [place] to get patient [employer] patient assistance application---address in chart accurate per patient.    Note routed to PCP.    Karrie Meres, PharmD, [place] [phone]

## 2017-12-20 DIAGNOSIS — E1140 Type 2 diabetes mellitus with diabetic neuropathy, unspecified: Secondary | ICD-10-CM | POA: Diagnosis not present

## 2017-12-20 DIAGNOSIS — I5020 Unspecified systolic (congestive) heart failure: Secondary | ICD-10-CM | POA: Diagnosis not present

## 2017-12-20 DIAGNOSIS — I959 Hypotension, unspecified: Secondary | ICD-10-CM | POA: Diagnosis not present

## 2017-12-20 DIAGNOSIS — J449 Chronic obstructive pulmonary disease, unspecified: Secondary | ICD-10-CM | POA: Diagnosis not present

## 2017-12-20 DIAGNOSIS — J9611 Chronic respiratory failure with hypoxia: Secondary | ICD-10-CM | POA: Diagnosis not present

## 2017-12-21 ENCOUNTER — Other Ambulatory Visit: Payer: Self-pay | Admitting: Pharmacy Technician

## 2017-12-21 NOTE — Patient Outreach (Signed)
[facility]) Care Management  12/21/2017  OLUWATONI ROTUNNO 04/08/1951 [id number]  Mailing [employer] patient assistance application for Januvia and Proventil HFA to patient and provider Edwyna Ready [place]) for completion. [employer] requires the original application therefore it cannot be faxed to the provider.  Doreene Burke, [place] [phone]

## 2017-12-22 ENCOUNTER — Other Ambulatory Visit (HOSPITAL_COMMUNITY): Payer: Self-pay | Admitting: Internal Medicine

## 2017-12-22 ENCOUNTER — Ambulatory Visit (HOSPITAL_COMMUNITY)
Admission: RE | Admit: 2017-12-22 | Discharge: 2017-12-22 | Disposition: A | Discharge status: Home / Self Care | Payer: Medicare Other | Source: Ambulatory Visit | Attending: Internal Medicine | Admitting: Internal Medicine

## 2017-12-22 DIAGNOSIS — J449 Chronic obstructive pulmonary disease, unspecified: Secondary | ICD-10-CM

## 2017-12-22 DIAGNOSIS — R05 Cough: Secondary | ICD-10-CM | POA: Diagnosis not present

## 2017-12-22 DIAGNOSIS — J42 Unspecified chronic bronchitis: Secondary | ICD-10-CM | POA: Diagnosis not present

## 2018-01-04 ENCOUNTER — Other Ambulatory Visit: Payer: Self-pay | Admitting: Pharmacy Technician

## 2018-01-04 NOTE — Patient Outreach (Signed)
[facility]) Care Management  01/04/2018  Mary [name] 06-Jul-1951 [id number]  Unsuccessful patient outreach call in reference to an application mailed to the patient 12/21/2017. I was unable to leave a message due to the call being disconnected.  Doreene Burke, [place] [phone]

## 2018-01-05 ENCOUNTER — Other Ambulatory Visit: Payer: Self-pay | Admitting: Pharmacy Technician

## 2018-01-05 NOTE — Patient Outreach (Signed)
[facility]) Care Management  01/05/2018  KAVYA HAAG [birth year] [id number]  Successful patient outreach call in reference to a [employer] patient assistance application that was mailed 12/21/2017. HIPAA identifiers verified and verbal consent received. The patient states she has company and will call me back.  Doreene Burke, [place] [phone]

## 2018-01-07 ENCOUNTER — Emergency Department (HOSPITAL_COMMUNITY): Payer: Medicare Other

## 2018-01-07 ENCOUNTER — Other Ambulatory Visit: Payer: Self-pay

## 2018-01-07 ENCOUNTER — Inpatient Hospital Stay (HOSPITAL_COMMUNITY)
Admission: EM | Admit: 2018-01-07 | Discharge: 2018-01-12 | DRG: 190 | Disposition: A | Discharge status: Home Health | Payer: Medicare Other | Attending: Internal Medicine | Admitting: Internal Medicine

## 2018-01-07 ENCOUNTER — Encounter (HOSPITAL_COMMUNITY): Payer: Self-pay | Admitting: Emergency Medicine

## 2018-01-07 DIAGNOSIS — I959 Hypotension, unspecified: Secondary | ICD-10-CM | POA: Diagnosis present

## 2018-01-07 DIAGNOSIS — R0602 Shortness of breath: Secondary | ICD-10-CM

## 2018-01-07 DIAGNOSIS — I351 Nonrheumatic aortic (valve) insufficiency: Secondary | ICD-10-CM | POA: Diagnosis not present

## 2018-01-07 DIAGNOSIS — Z8701 Personal history of pneumonia (recurrent): Secondary | ICD-10-CM

## 2018-01-07 DIAGNOSIS — E871 Hypo-osmolality and hyponatremia: Secondary | ICD-10-CM | POA: Diagnosis present

## 2018-01-07 DIAGNOSIS — F419 Anxiety disorder, unspecified: Secondary | ICD-10-CM | POA: Diagnosis present

## 2018-01-07 DIAGNOSIS — Z951 Presence of aortocoronary bypass graft: Secondary | ICD-10-CM

## 2018-01-07 DIAGNOSIS — N179 Acute kidney failure, unspecified: Secondary | ICD-10-CM | POA: Diagnosis not present

## 2018-01-07 DIAGNOSIS — E11649 Type 2 diabetes mellitus with hypoglycemia without coma: Secondary | ICD-10-CM | POA: Diagnosis not present

## 2018-01-07 DIAGNOSIS — I2510 Atherosclerotic heart disease of native coronary artery without angina pectoris: Secondary | ICD-10-CM | POA: Diagnosis present

## 2018-01-07 DIAGNOSIS — E1142 Type 2 diabetes mellitus with diabetic polyneuropathy: Secondary | ICD-10-CM | POA: Diagnosis not present

## 2018-01-07 DIAGNOSIS — G9341 Metabolic encephalopathy: Secondary | ICD-10-CM | POA: Diagnosis present

## 2018-01-07 DIAGNOSIS — J9601 Acute respiratory failure with hypoxia: Secondary | ICD-10-CM | POA: Diagnosis not present

## 2018-01-07 DIAGNOSIS — I110 Hypertensive heart disease with heart failure: Secondary | ICD-10-CM | POA: Diagnosis not present

## 2018-01-07 DIAGNOSIS — B974 Respiratory syncytial virus as the cause of diseases classified elsewhere: Secondary | ICD-10-CM | POA: Diagnosis present

## 2018-01-07 DIAGNOSIS — Z8349 Family history of other endocrine, nutritional and metabolic diseases: Secondary | ICD-10-CM

## 2018-01-07 DIAGNOSIS — I482 Chronic atrial fibrillation: Secondary | ICD-10-CM | POA: Diagnosis not present

## 2018-01-07 DIAGNOSIS — T402X5A Adverse effect of other opioids, initial encounter: Secondary | ICD-10-CM | POA: Diagnosis not present

## 2018-01-07 DIAGNOSIS — I4510 Unspecified right bundle-branch block: Secondary | ICD-10-CM | POA: Diagnosis not present

## 2018-01-07 DIAGNOSIS — E785 Hyperlipidemia, unspecified: Secondary | ICD-10-CM | POA: Diagnosis not present

## 2018-01-07 DIAGNOSIS — Z888 Allergy status to other drugs, medicaments and biological substances status: Secondary | ICD-10-CM

## 2018-01-07 DIAGNOSIS — Z7982 Long term (current) use of aspirin: Secondary | ICD-10-CM

## 2018-01-07 DIAGNOSIS — J441 Chronic obstructive pulmonary disease with (acute) exacerbation: Secondary | ICD-10-CM | POA: Diagnosis not present

## 2018-01-07 DIAGNOSIS — R740 Nonspecific elevation of levels of transaminase and lactic acid dehydrogenase [LDH]: Secondary | ICD-10-CM | POA: Diagnosis not present

## 2018-01-07 DIAGNOSIS — Z9114 Patient's other noncompliance with medication regimen: Secondary | ICD-10-CM

## 2018-01-07 DIAGNOSIS — D649 Anemia, unspecified: Secondary | ICD-10-CM | POA: Diagnosis present

## 2018-01-07 DIAGNOSIS — Z8249 Family history of ischemic heart disease and other diseases of the circulatory system: Secondary | ICD-10-CM

## 2018-01-07 DIAGNOSIS — I361 Nonrheumatic tricuspid (valve) insufficiency: Secondary | ICD-10-CM | POA: Diagnosis not present

## 2018-01-07 DIAGNOSIS — I10 Essential (primary) hypertension: Secondary | ICD-10-CM | POA: Diagnosis not present

## 2018-01-07 DIAGNOSIS — Z833 Family history of diabetes mellitus: Secondary | ICD-10-CM

## 2018-01-07 DIAGNOSIS — I4891 Unspecified atrial fibrillation: Secondary | ICD-10-CM | POA: Diagnosis present

## 2018-01-07 DIAGNOSIS — Z7951 Long term (current) use of inhaled steroids: Secondary | ICD-10-CM

## 2018-01-07 DIAGNOSIS — I5032 Chronic diastolic (congestive) heart failure: Secondary | ICD-10-CM | POA: Diagnosis present

## 2018-01-07 DIAGNOSIS — F1110 Opioid abuse, uncomplicated: Secondary | ICD-10-CM | POA: Diagnosis present

## 2018-01-07 DIAGNOSIS — F329 Major depressive disorder, single episode, unspecified: Secondary | ICD-10-CM | POA: Diagnosis present

## 2018-01-07 DIAGNOSIS — E872 Acidosis: Secondary | ICD-10-CM | POA: Diagnosis not present

## 2018-01-07 DIAGNOSIS — T380X5A Adverse effect of glucocorticoids and synthetic analogues, initial encounter: Secondary | ICD-10-CM | POA: Diagnosis not present

## 2018-01-07 DIAGNOSIS — Z87891 Personal history of nicotine dependence: Secondary | ICD-10-CM

## 2018-01-07 DIAGNOSIS — J9602 Acute respiratory failure with hypercapnia: Secondary | ICD-10-CM | POA: Diagnosis not present

## 2018-01-07 DIAGNOSIS — I340 Nonrheumatic mitral (valve) insufficiency: Secondary | ICD-10-CM | POA: Diagnosis not present

## 2018-01-07 DIAGNOSIS — Z7984 Long term (current) use of oral hypoglycemic drugs: Secondary | ICD-10-CM

## 2018-01-07 DIAGNOSIS — J9690 Respiratory failure, unspecified, unspecified whether with hypoxia or hypercapnia: Secondary | ICD-10-CM | POA: Diagnosis not present

## 2018-01-07 DIAGNOSIS — Z91040 Latex allergy status: Secondary | ICD-10-CM

## 2018-01-07 DIAGNOSIS — R296 Repeated falls: Secondary | ICD-10-CM | POA: Diagnosis present

## 2018-01-07 DIAGNOSIS — Z7901 Long term (current) use of anticoagulants: Secondary | ICD-10-CM

## 2018-01-07 DIAGNOSIS — E869 Volume depletion, unspecified: Secondary | ICD-10-CM | POA: Diagnosis present

## 2018-01-07 DIAGNOSIS — Z23 Encounter for immunization: Secondary | ICD-10-CM | POA: Diagnosis not present

## 2018-01-07 DIAGNOSIS — R05 Cough: Secondary | ICD-10-CM | POA: Diagnosis not present

## 2018-01-07 DIAGNOSIS — Z90710 Acquired absence of both cervix and uterus: Secondary | ICD-10-CM

## 2018-01-07 DIAGNOSIS — I5033 Acute on chronic diastolic (congestive) heart failure: Secondary | ICD-10-CM | POA: Diagnosis not present

## 2018-01-07 DIAGNOSIS — R7401 Elevation of levels of liver transaminase levels: Secondary | ICD-10-CM

## 2018-01-07 DIAGNOSIS — G9340 Encephalopathy, unspecified: Secondary | ICD-10-CM

## 2018-01-07 DIAGNOSIS — E8729 Other acidosis: Secondary | ICD-10-CM

## 2018-01-07 DIAGNOSIS — Z79899 Other long term (current) drug therapy: Secondary | ICD-10-CM

## 2018-01-07 DIAGNOSIS — F32A Depression, unspecified: Secondary | ICD-10-CM | POA: Diagnosis present

## 2018-01-07 DIAGNOSIS — R4182 Altered mental status, unspecified: Secondary | ICD-10-CM | POA: Diagnosis present

## 2018-01-07 DIAGNOSIS — Z9109 Other allergy status, other than to drugs and biological substances: Secondary | ICD-10-CM

## 2018-01-07 DIAGNOSIS — R6510 Systemic inflammatory response syndrome (SIRS) of non-infectious origin without acute organ dysfunction: Secondary | ICD-10-CM | POA: Diagnosis present

## 2018-01-07 DIAGNOSIS — T424X5A Adverse effect of benzodiazepines, initial encounter: Secondary | ICD-10-CM | POA: Diagnosis not present

## 2018-01-07 DIAGNOSIS — I5030 Unspecified diastolic (congestive) heart failure: Secondary | ICD-10-CM | POA: Diagnosis present

## 2018-01-07 DIAGNOSIS — A419 Sepsis, unspecified organism: Secondary | ICD-10-CM

## 2018-01-07 DIAGNOSIS — R7989 Other specified abnormal findings of blood chemistry: Secondary | ICD-10-CM | POA: Diagnosis not present

## 2018-01-07 DIAGNOSIS — R4020 Unspecified coma: Secondary | ICD-10-CM | POA: Diagnosis not present

## 2018-01-07 LAB — COMPREHENSIVE METABOLIC PANEL
ALK PHOS: 71 U/L (ref 38–126)
ALT: 252 U/L — AB (ref 14–54)
AST: 268 U/L — ABNORMAL HIGH (ref 15–41)
Albumin: 3.7 g/dL (ref 3.5–5.0)
Anion gap: 14 (ref 5–15)
BILIRUBIN TOTAL: 0.6 mg/dL (ref 0.3–1.2)
BUN: 47 mg/dL — AB (ref 6–20)
CALCIUM: 8.9 mg/dL (ref 8.9–10.3)
CHLORIDE: 92 mmol/L — AB (ref 101–111)
CO2: 24 mmol/L (ref 22–32)
CREATININE: 2.55 mg/dL — AB (ref 0.44–1.00)
GFR calc Af Amer: 21 mL/min — ABNORMAL LOW (ref >60)
GFR, EST NON AFRICAN AMERICAN: 18 mL/min — AB (ref >60)
Glucose, Bld: 95 mg/dL (ref 65–99)
Potassium: 4.8 mmol/L (ref 3.5–5.1)
Sodium: 130 mmol/L — ABNORMAL LOW (ref 135–145)
Total Protein: 7 g/dL (ref 6.5–8.1)

## 2018-01-07 LAB — BRAIN NATRIURETIC PEPTIDE: B Natriuretic Peptide: 269 pg/mL — ABNORMAL HIGH (ref 0.0–100.0)

## 2018-01-07 LAB — BLOOD GAS, ARTERIAL
Acid-base deficit: 0.6 mmol/L (ref 0.0–2.0)
Bicarbonate: 22.8 mmol/L (ref 20.0–28.0)
Drawn by: 270161
O2 CONTENT: 8 L/min
O2 SAT: 97.5 %
PCO2 ART: 66.9 mmHg — AB (ref 32.0–48.0)
PH ART: 7.218 — AB (ref 7.350–7.450)
PO2 ART: 133 mmHg — AB (ref 83.0–108.0)
Patient temperature: 37

## 2018-01-07 LAB — TROPONIN I: Troponin I: 0.04 ng/mL (ref <0.03)

## 2018-01-07 LAB — SALICYLATE LEVEL: Salicylate Lvl: <7 mg/dL (ref 2.8–30.0)

## 2018-01-07 LAB — URINALYSIS, ROUTINE W REFLEX MICROSCOPIC
Bilirubin Urine: NEGATIVE
Glucose, UA: NEGATIVE mg/dL
HGB URINE DIPSTICK: NEGATIVE
Ketones, ur: NEGATIVE mg/dL
LEUKOCYTES UA: NEGATIVE
Nitrite: NEGATIVE
Protein, ur: NEGATIVE mg/dL
SPECIFIC GRAVITY, URINE: 1.014 (ref 1.005–1.030)
pH: 5 (ref 5.0–8.0)

## 2018-01-07 LAB — CBC WITH DIFFERENTIAL/PLATELET
Basophils Absolute: 0.1 10*3/uL (ref 0.0–0.1)
Basophils Relative: 1 %
EOS PCT: 1 %
Eosinophils Absolute: 0.1 10*3/uL (ref 0.0–0.7)
HEMATOCRIT: 33.9 % — AB (ref 36.0–46.0)
HEMOGLOBIN: 10.8 g/dL — AB (ref 12.0–15.0)
LYMPHS ABS: 2.3 10*3/uL (ref 0.7–4.0)
LYMPHS PCT: 24 %
MCH: 28.1 pg (ref 26.0–34.0)
MCHC: 31.9 g/dL (ref 30.0–36.0)
MCV: 88.1 fL (ref 78.0–100.0)
Monocytes Absolute: 1.2 10*3/uL (ref 0.1–1.0)
Monocytes Relative: 13 %
NEUTROS ABS: 5.9 10*3/uL (ref 1.7–7.7)
NEUTROS PCT: 61 %
PLATELETS: 215 10*3/uL (ref 150–400)
RBC: 3.85 MIL/uL — AB (ref 3.87–5.11)
RDW: 16.2 % — ABNORMAL HIGH (ref 11.5–15.5)
WBC: 9.7 10*3/uL (ref 4.0–10.5)

## 2018-01-07 LAB — ETHANOL

## 2018-01-07 LAB — RAPID URINE DRUG SCREEN, HOSP PERFORMED
AMPHETAMINES: NOT DETECTED
BARBITURATES: NOT DETECTED
BENZODIAZEPINES: POSITIVE — AB
COCAINE: NOT DETECTED
Opiates: POSITIVE — AB
Tetrahydrocannabinol: NOT DETECTED

## 2018-01-07 LAB — ACETAMINOPHEN LEVEL: Acetaminophen (Tylenol), Serum: <10 ug/mL — ABNORMAL LOW (ref 10–30)

## 2018-01-07 LAB — LACTIC ACID, PLASMA: LACTIC ACID, VENOUS: 1.4 mmol/L (ref 0.5–1.9)

## 2018-01-07 LAB — I-STAT CG4 LACTIC ACID, ED: LACTIC ACID, VENOUS: 0.57 mmol/L (ref 0.5–1.9)

## 2018-01-07 LAB — DIGOXIN LEVEL: Digoxin Level: 1.5 ng/mL (ref 0.8–2.0)

## 2018-01-07 MED ORDER — SODIUM CHLORIDE 0.9 % IV BOLUS (SEPSIS)
500.0000 mL | Freq: Once | INTRAVENOUS | Status: AC
  Administered 2018-01-07: 500 mL via INTRAVENOUS

## 2018-01-07 MED ORDER — NALOXONE HCL 0.4 MG/ML IJ SOLN
0.4000 mg | Freq: Once | INTRAMUSCULAR | Status: AC
  Administered 2018-01-07: 0.4 mg via INTRAVENOUS
  Filled 2018-01-07: qty 1

## 2018-01-07 MED ORDER — VANCOMYCIN HCL IN DEXTROSE 750-5 MG/150ML-% IV SOLN
750.0000 mg | INTRAVENOUS | Status: DC
  Filled 2018-01-07: qty 150

## 2018-01-07 MED ORDER — PIPERACILLIN-TAZOBACTAM IN DEX 2-0.25 GM/50ML IV SOLN
2.2500 g | Freq: Four times a day (QID) | INTRAVENOUS | Status: DC
  Administered 2018-01-08: 2.25 g via INTRAVENOUS
  Filled 2018-01-07 (×12): qty 50

## 2018-01-07 MED ORDER — PIPERACILLIN-TAZOBACTAM 3.375 G IVPB 30 MIN: 3.3750 g | Freq: Three times a day (TID) | INTRAVENOUS | Status: DC

## 2018-01-07 MED ORDER — PIPERACILLIN-TAZOBACTAM 3.375 G IVPB 30 MIN
3.3750 g | Freq: Once | INTRAVENOUS | Status: AC
  Administered 2018-01-07: 3.375 g via INTRAVENOUS
  Filled 2018-01-07: qty 50

## 2018-01-07 MED ORDER — ALBUTEROL SULFATE (2.5 MG/3ML) 0.083% IN NEBU
2.5000 mg | INHALATION_SOLUTION | RESPIRATORY_TRACT | Status: DC | PRN
Start: 2018-01-07 — End: 2018-01-08

## 2018-01-07 MED ORDER — ALBUTEROL (5 MG/ML) CONTINUOUS INHALATION SOLN
10.0000 mg/h | INHALATION_SOLUTION | RESPIRATORY_TRACT | Status: AC
  Administered 2018-01-07: 10 mg/h via RESPIRATORY_TRACT
  Filled 2018-01-07: qty 20

## 2018-01-07 MED ORDER — IPRATROPIUM-ALBUTEROL 0.5-2.5 (3) MG/3ML IN SOLN: 3.0000 mL | Freq: Four times a day (QID) | RESPIRATORY_TRACT | Status: DC

## 2018-01-07 MED ORDER — IPRATROPIUM BROMIDE 0.02 % IN SOLN: 0.5000 mg | Freq: Four times a day (QID) | RESPIRATORY_TRACT | Status: DC

## 2018-01-07 MED ORDER — ALBUTEROL SULFATE (2.5 MG/3ML) 0.083% IN NEBU
5.0000 mg | INHALATION_SOLUTION | Freq: Once | RESPIRATORY_TRACT | Status: AC
  Administered 2018-01-07: 5 mg via RESPIRATORY_TRACT
  Filled 2018-01-07: qty 6

## 2018-01-07 MED ORDER — VANCOMYCIN HCL IN DEXTROSE 1-5 GM/200ML-% IV SOLN
1000.0000 mg | Freq: Once | INTRAVENOUS | Status: AC
  Administered 2018-01-07: 1000 mg via INTRAVENOUS
  Filled 2018-01-07: qty 200

## 2018-01-07 MED ORDER — SODIUM CHLORIDE 0.9 % IV BOLUS (SEPSIS)
250.0000 mL | Freq: Once | INTRAVENOUS | Status: AC
  Administered 2018-01-07: 250 mL via INTRAVENOUS

## 2018-01-07 MED ORDER — ALBUTEROL SULFATE (2.5 MG/3ML) 0.083% IN NEBU: 2.5000 mg | INHALATION_SOLUTION | Freq: Four times a day (QID) | RESPIRATORY_TRACT | Status: DC

## 2018-01-07 MED ORDER — INSULIN ASPART 100 UNIT/ML ~~LOC~~ SOLN
0.0000 [IU] | Freq: Three times a day (TID) | SUBCUTANEOUS | Status: DC
  Administered 2018-01-08: 9 [IU] via SUBCUTANEOUS
  Administered 2018-01-08: 3 [IU] via SUBCUTANEOUS
  Filled 2018-01-07 (×2): qty 1

## 2018-01-07 MED ORDER — SODIUM CHLORIDE 0.9 % IV BOLUS (SEPSIS)
1000.0000 mL | Freq: Once | INTRAVENOUS | Status: AC
  Administered 2018-01-07: 1000 mL via INTRAVENOUS

## 2018-01-07 NOTE — ED Notes (Signed)
Family at bedside. 

## 2018-01-07 NOTE — ED Notes (Signed)
Daughter Wilburn Cornelia  [phone]

## 2018-01-07 NOTE — ED Notes (Signed)
Respiratory in to DC Bipap so pt can transport to CT

## 2018-01-07 NOTE — ED Notes (Signed)
From CT 

## 2018-01-07 NOTE — Progress Notes (Signed)
PHARMACY NOTE:  ANTIMICROBIAL RENAL DOSAGE ADJUSTMENT  Current antimicrobial regimen includes a mismatch between antimicrobial dosage and estimated renal function.  As per policy approved by the Pharmacy & Therapeutics and Medical Executive Committees, the antimicrobial dosage will be adjusted accordingly.  Current antimicrobial dosage:  Zosyn 3.375 gram IV q8h  Indication: Sepsis  Renal Function:  Estimated Creatinine Clearance: 16.8 mL/min (A) (by C-G formula based on SCr of 2.55 mg/dL (H)). []      On intermittent HD, scheduled: []      On CRRT    Antimicrobial dosage has been changed to:  Zosyn 2.25 gram IV q6h  Additional comments:   Thank you for allowing pharmacy to be a part of this patient's care.  Vernie Ammons, [facility] 01/07/2018 10:44 PM

## 2018-01-07 NOTE — ED Notes (Signed)
To CT

## 2018-01-07 NOTE — ED Notes (Signed)
Late note:  Neighbor brought pt in to be assessed saying that the neighbors look after each other and that pt has a sister but she is unable to care for her. She reports that pt is supposed to be on home O2, but it costs over 500.00 monthly and pt reports she cannot afford it so she does not use it.   Pt O2 sat in triage is in the low 70s- brought to the back where she follows commands, but is lethargic and answers questions slowly

## 2018-01-07 NOTE — ED Notes (Signed)
Pt more alert after narcan  Reports she can't breathe - pulse ox 100 percent on bipap

## 2018-01-07 NOTE — ED Notes (Signed)
Patient given two warm blankets at this time. Patient will follow verbal commands.

## 2018-01-07 NOTE — ED Notes (Signed)
Dr Sabra Heck in to assess

## 2018-01-07 NOTE — ED Provider Notes (Signed)
[facility] EMERGENCY DEPARTMENT Provider Note   CSN: [id number] Arrival date & time: 01/07/18  1749     History   Chief Complaint Chief Complaint  Patient presents with  . Fall    HPI Mary [name] is a 67 y.o. female.  HPI  67 y/o female Recently diagnosed with PNA on 11/15/17 - given Levaquin after short admit - given O2 for new requireiment.   Has hx of DM, CAD (CABG) and CHF (diastolic) and COPD.  Pt has had 3 falls in 3 weeks - not wearing O2 she was Rx b?c of cost.  Has had more coughing, more swelling, more confusion and today was on floor confused - talking to neighbor.  No known trauma, pt unable to give history due to altered LOC.  Neighbor saw yesterday and she was better with mental status - has had comments about not wanting to "do it anymore" concerning for depression / SI.    Level 5 caveat applies.  Past Medical History:  Diagnosis Date  . Anxiety   . Arthritis   . Atrial fibrillation ([place])   . Bronchial asthma   . CHF (congestive heart failure) ([place])   . Chronic pain    Bacl pain, Disc L5-S1- Dr. Joya Salm in [place]  . COPD (chronic obstructive pulmonary disease) ([place])   . DDD (degenerative disc disease)    Radicular symptoms  . Diabetes mellitus   . History of stress test 06/2011   Abnormal myocardial perfusion study.  Marland Kitchen Hx of echocardiogram    Was interpreted by Dr Doylene Canard that showed an Ef in the 50-60% range with grade 1 diastolic dysfunction. she had moderate MR, biatrial enlargement, moderate TR and at that time estimated RV systolic pressure was 43 mm.  . Hyperlipidemia   . Hypertension   . Neuropathy   . Shortness of breath   . Tachycardia     Patient Active Problem List   Diagnosis Date Noted  . Lobar pneumonia ([place]) 11/16/2017  . Chronic diastolic CHF (congestive heart failure) ([place]) 11/16/2017  . Acute respiratory failure with hypoxia ([place]) 11/15/2017  . Malnutrition of moderate degree 02/20/2017  . Acute on chronic respiratory failure  with hypoxia ([place]) 02/18/2017  . Hypomagnesemia 02/18/2017  . COPD with acute exacerbation ([place]) 11/04/2016  . Left carotid bruit 09/13/2015  . Noncompliance with medications 03/19/2015  . Gastroenteritis 03/19/2015  . Seasonal allergies 01/25/2015  . Chest pain 05/16/2014  . HTN (hypertension) 05/16/2014  . PSVT (paroxysmal supraventricular tachycardia) ([place]) 05/16/2014  . Acute diastolic CHF (congestive heart failure) ([place]) 05/15/2014  . CAD (coronary artery disease) 05/15/2014  . RBBB 04/25/2014  . Benign skin lesion 03/07/2014  . 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 Atrial fibrillation with rapid ventricular response ([place]) 01/06/2014  . Leukocytosis 01/06/2014  . Fall at home 09/18/2013  . Recurrent falls 09/18/2013  . Tinnitus of left ear 02/19/2013  . Diastolic congestive heart failure ([place]) 09/10/2012  . Atrial fibrillation ([place]) 09/10/2012  . DDD (degenerative disc disease), lumbar 08/26/2012  . Atypical mole 04/14/2012  . Seborrheic keratosis 04/14/2012  . Depression 12/09/2011  . COPD (chronic obstructive pulmonary disease) ([place]) 06/27/2011  . Anxiety 06/27/2011  . Chronic pain 05/27/2011  . Diabetes mellitus ([employer]) 05/26/2011  . Hyperlipidemia 05/26/2011    Past Surgical History:  Procedure Laterality Date  . ABDOMINAL HYSTERECTOMY     partial  . Breast cyst removed    . CARPAL TUNNEL RELEASE     x 2  . CORONARY  ARTERY BYPASS GRAFT N/A 01/09/2014   Procedure: CORONARY ARTERY BYPASS GRAFTING (CABG) x 3 using endoscopically harvested right saphenous vein and left internal mammary artery and closure of left atrial appendage;  Surgeon: Ivin Poot, MD;  Location: [place];  Service: Open Heart Surgery;  Laterality: N/A;  patient has preop IA BP   . INTRAOPERATIVE TRANSESOPHAGEAL ECHOCARDIOGRAM N/A 01/09/2014   Procedure: INTRAOPERATIVE TRANSESOPHAGEAL ECHOCARDIOGRAM;  Surgeon: Ivin Poot, MD;  Location: [place];  Service: Open Heart Surgery;  Laterality: N/A;  . LEFT HEART  CATHETERIZATION WITH CORONARY ANGIOGRAM N/A 01/07/2014   Procedure: LEFT HEART CATHETERIZATION WITH CORONARY ANGIOGRAM;  Surgeon: Lorretta Harp, MD;  Location: [facility] CATH LAB;  Service: Cardiovascular;  Laterality: N/A;  . TONSILLECTOMY    . TUBAL LIGATION      OB History    Gravida Para Term Preterm AB Living   1 1 1      0   SAB TAB Ectopic Multiple Live Births                   Home Medications    Prior to Admission medications   Medication Sig Start Date End Date Taking? Authorizing Provider  ACCU-CHEK AVIVA PLUS test strip USE TO TEST 3 TO 4 TIMES DAILY OR AS DIRECTED. 07/20/16   Alycia Rossetti, MD  acetaminophen (TYLENOL) 500 MG tablet Take 500 mg by mouth every 6 (six) hours as needed for mild pain.    [provider]  acetylcysteine (MUCOMYST) 20 % nebulizer solution Take 3 mLs by nebulization every 6 (six) hours. Patient not taking: Reported on 12/17/2017 02/24/17   Eber Jones, MD  albuterol (PROVENTIL HFA;VENTOLIN HFA) 108 [phone] Base) MCG/ACT inhaler Inhale 1-2 puffs into the lungs every 6 (six) hours as needed for shortness of breath.    [provider]  ALPRAZolam Duanne Moron) 1 MG tablet Take 1 mg by mouth 4 (four) times daily.  03/10/16   [provider]  apixaban (ELIQUIS) 5 MG TABS tablet Take 1 tablet (5 mg total) by mouth 2 (two) times daily. Take place of Pradaxa 11/01/17   Troy Sine, MD  aspirin EC 81 MG tablet Take 81 mg by mouth daily.    [provider]  atorvastatin (LIPITOR) 40 MG tablet TAKE ONE TABLET BY MOUTH DAILY. 03/23/17   Troy Sine, MD  DIGOX 125 MCG tablet TAKE (1) TABLET BY MOUTH ONCE DAILY. 08/06/17   Troy Sine, MD  DULoxetine (CYMBALTA) 30 MG capsule Take 1 capsule by mouth daily. 11/01/17   [provider]  fluticasone (FLONASE) 50 MCG/ACT nasal spray Place 2 sprays into both nostrils daily. 01/25/15   Alycia Rossetti, MD  furosemide (LASIX) 40 MG tablet TAKE ONE TABLET BY MOUTH ONCE DAILY.  01/22/17   Troy Sine, MD  gabapentin (NEURONTIN) 300 MG capsule Take 2 capsules by mouth 3 (three) times daily. 11/11/17   [provider]  glipiZIDE (GLUCOTROL XL) 2.5 MG 24 hr tablet 2.5 mg daily.  02/28/16   [provider]  JANUVIA 100 MG tablet TAKE ONE TABLET BY MOUTH DAILY. 07/19/15   Rhea, Modena Nunnery, MD  levalbuterol Penne Lash) 0.63 MG/3ML nebulizer solution Take 3 mLs (0.63 mg total) by nebulization every 6 (six) hours. 02/24/17   Eber Jones, MD  lisinopril (PRINIVIL,ZESTRIL) 2.5 MG tablet TAKE ONE TABLET BY MOUTH DAILY. 08/23/17   Troy Sine, MD  Magnesium Oxide 250 MG TABS Take 250 mg by mouth daily.  [provider]  metFORMIN (GLUCOPHAGE) 850 MG tablet TAKE 1 TABLET BY MOUTH TWICE DAILY WITH MEALS. 07/19/15   Alycia Rossetti, MD  metoprolol tartrate (LOPRESSOR) 50 MG tablet TAKE 2 TABLETS BY MOUTH IN THE MORNING AND 1 AND 1/2 TABLETS AT BEDTIME. 08/23/17   Troy Sine, MD  montelukast (SINGULAIR) 10 MG tablet Take 1 tablet by mouth daily. 01/21/17   [provider]  Multiple Vitamin (MULTIVITAMIN) capsule Take 1 capsule by mouth daily.    [provider]  potassium chloride SA (K-DUR,KLOR-CON) 20 MEQ tablet Take 1 tablet (20 mEq total) by mouth daily. 11/06/16   Thurnell Lose, MD  sodium chloride (OCEAN) 0.65 % nasal spray Place 1 spray into the nose as needed for congestion. Congestion    [provider]  SYMBICORT 160-4.5 MCG/ACT inhaler INHALE 2 PUFFS INTO THE LUNGS TWICE DAILY. RINSE MOUTH AFTER USE. 06/04/15   Alycia Rossetti, MD    Family History Family History  Problem Relation Age of Onset  . Diabetes Mother   . Heart disease Mother   . Diabetes Sister   . Hypertension Sister   . Hyperlipidemia Sister   . Diabetes Brother   . Heart disease Brother   . Diabetes Brother   . Heart disease Brother     Social History Social History   Tobacco Use  . Smoking status: Former Smoker     Packs/day: 1.50    Years: 30.00    Pack years: 45.00    Types: Cigarettes    Last attempt to quit: 05/26/2013    Years since quitting: 4.6  . Smokeless tobacco: Never Used  . Tobacco comment: smells of heavy smoke 02/18/17  Substance Use Topics  . Alcohol use: No  . Drug use: No     Allergies   Adhesive [tape]; Latex; and Zocor [simvastatin - high dose]   Review of Systems Review of Systems  Unable to perform ROS: Mental status change     Physical Exam Updated Vital Signs BP (!) 96/59   Pulse 69   Temp (!) 96.7 F (35.9 C) (Rectal)   Resp 13   Ht 5\' 4"  (1.626 m)   Wt 50.3 kg (111 lb)   SpO2 100%   BMI 19.05 kg/m   Physical Exam  Constitutional: She appears well-developed and well-nourished. She appears distressed.  HENT:  Head: Normocephalic and atraumatic.  Mouth/Throat: Oropharynx is clear and moist. No oropharyngeal exudate.  Eyes: Conjunctivae and EOM are normal. Pupils are equal, round, and reactive to light. Right eye exhibits no discharge. Left eye exhibits no discharge. No scleral icterus.  Neck: Normal range of motion. Neck supple. No JVD present. No thyromegaly present.  Cardiovascular: Normal rate and normal heart sounds. Exam reveals no gallop and no friction rub.  No murmur heard. Occasional ectopy, weak pulses  Pulmonary/Chest: She is in respiratory distress. She has wheezes. She has no rales.  Abdominal: Soft. Bowel sounds are normal. She exhibits no distension and no mass. There is no tenderness.  Musculoskeletal: Normal range of motion. She exhibits edema ( symmetrical ankle edema bil). She exhibits no tenderness.  Lymphadenopathy:    She has no cervical adenopathy.  Neurological:  Confused, dec LOC, somnolent but arousable to loud voice - slow to follow commands - easy to drift off, diffuse weakness  Skin: Skin is warm and dry. No rash noted. No erythema.  Psychiatric: She has a normal mood and affect. Her behavior is normal.  Nursing note and  vitals reviewed.    ED Treatments / Results  Labs (all labs ordered are listed, but only abnormal results are displayed) Labs Reviewed  RAPID URINE DRUG SCREEN, HOSP PERFORMED - Abnormal; Notable for the following components:      Result Value   Opiates POSITIVE (*)    Benzodiazepines POSITIVE (*)    All other components within normal limits  BLOOD GAS, ARTERIAL - Abnormal; Notable for the following components:   pH, Arterial 7.218 (*)    pCO2 arterial 66.9 (*)    pO2, Arterial 133 (*)    All other components within normal limits  BRAIN NATRIURETIC PEPTIDE - Abnormal; Notable for the following components:   B Natriuretic Peptide 269.0 (*)    All other components within normal limits  TROPONIN I - Abnormal; Notable for the following components:   Troponin I 0.04 (*)    All other components within normal limits  CBC WITH DIFFERENTIAL/PLATELET - Abnormal; Notable for the following components:   RBC 3.85 (*)    Hemoglobin 10.8 (*)    HCT 33.9 (*)    RDW 16.2 (*)    All other components within normal limits  COMPREHENSIVE METABOLIC PANEL - Abnormal; Notable for the following components:   Sodium 130 (*)    Chloride 92 (*)    BUN 47 (*)    Creatinine, Ser 2.55 (*)    AST 268 (*)    ALT 252 (*)    GFR calc non Af Amer 18 (*)    GFR calc Af Amer 21 (*)    All other components within normal limits  ACETAMINOPHEN LEVEL - Abnormal; Notable for the following components:   Acetaminophen (Tylenol), Serum <10 (*)    All other components within normal limits  URINALYSIS, ROUTINE W REFLEX MICROSCOPIC - Abnormal; Notable for the following components:   Color, Urine AMBER (*)    APPearance CLOUDY (*)    All other components within normal limits  CULTURE, BLOOD (ROUTINE X 2)  CULTURE, BLOOD (ROUTINE X 2)  URINE CULTURE  ETHANOL  DIGOXIN LEVEL  LACTIC ACID, PLASMA  SALICYLATE LEVEL  I-STAT CG4 LACTIC ACID, ED  I-STAT CG4 LACTIC ACID, ED  I-STAT CG4 LACTIC ACID, ED    EKG  EKG  Interpretation  Date/Time:  Friday January 07 2018 18:38:08 EDT Ventricular Rate:  82 PR Interval:    QRS Duration: 149 QT Interval:  391 QTC Calculation: 457 R Axis:   117 Text Interpretation:  Atrial fibrillation Right bundle branch block Since last tracing rate slower Confirmed by Noemi Chapel [phone]) on 01/07/2018 6:41:55 PM       Radiology Ct Head Wo Contrast  Result Date: 01/07/2018 CLINICAL DATA:  Altered level of consciousness. Multiple falls. Weakness. EXAM: CT HEAD WITHOUT CONTRAST TECHNIQUE: Contiguous axial images were obtained from the base of the skull through the vertex without intravenous contrast. COMPARISON:  10/25/2013 head CT. FINDINGS: Brain: No evidence of parenchymal hemorrhage or extra-axial fluid collection. No mass lesion, mass effect, or midline shift. No CT evidence of acute infarction. Nonspecific mild subcortical and periventricular white matter hypodensity, most in keeping with chronic small vessel ischemic change. Cerebral volume is age appropriate. No ventriculomegaly. Vascular: No acute abnormality. Skull: No evidence of calvarial fracture. Sinuses/Orbits: Mucoperiosteal thickening throughout paranasal sinuses, most prominent in the left maxillary sinus. No fluid levels. Other: Mild scattered gas in the right deep maxillary soft tissues is presumably iatrogenic related to peripheral IV placement. The mastoid air cells are unopacified. IMPRESSION: 1.  No evidence of acute intracranial abnormality. 2. Mild chronic small vessel ischemic changes in the cerebral white matter. 3. Chronic appearing paranasal sinusitis. Electronically Signed   By: Ilona Sorrel M.D.   On: 01/07/2018 21:30   Dg Chest Port 1 View  Result Date: 01/07/2018 CLINICAL DATA:  Cough and dyspnea. Patient fell earlier this evening. Third fall in 3 weeks. EXAM: PORTABLE CHEST 1 VIEW COMPARISON:  12/22/2017 FINDINGS: Heart is top-normal in size. There is aortic atherosclerosis at the arch without  aneurysm. The patient is status post median sternotomy and post CABG change. Left atrial appendage clip is also redemonstrated. Pulmonary vascular congestion is noted without acute pneumonic consolidation, effusion or pneumothorax. No acute osseous abnormality. IMPRESSION: Status post CABG with borderline cardiomegaly. Aortic atherosclerosis. Mild pulmonary vascular congestion. Electronically Signed   By: Ashley Royalty M.D.   On: 01/07/2018 19:58    Procedures .Critical Care Performed by: Noemi Chapel, MD Authorized by: Noemi Chapel, MD   Critical care provider statement:    Critical care time (minutes):  35   Critical care time was exclusive of:  Separately billable procedures and treating other patients and teaching time   Critical care was necessary to treat or prevent imminent or life-threatening deterioration of the following conditions:  Shock and respiratory failure   Critical care was time spent personally by me on the following activities:  Blood draw for specimens, development of treatment plan with patient or surrogate, discussions with consultants, evaluation of patient's response to treatment, examination of patient, obtaining history from patient or surrogate, ordering and performing treatments and interventions, ordering and review of laboratory studies, ordering and review of radiographic studies, pulse oximetry, re-evaluation of patient's condition and review of old charts   (including critical care time)  Medications Ordered in ED Medications  sodium chloride 0.9 % bolus 1,000 mL (0 mLs Intravenous Stopped 01/07/18 2100)    And  sodium chloride 0.9 % bolus 500 mL (not administered)    And  sodium chloride 0.9 % bolus 250 mL (not administered)  albuterol (PROVENTIL,VENTOLIN) solution continuous neb (not administered)  albuterol (PROVENTIL) (2.5 MG/3ML) 0.083% nebulizer solution 5 mg (5 mg Nebulization Given 01/07/18 1857)  vancomycin (VANCOCIN) IVPB 1000 mg/200 mL premix (0 mg  Intravenous Stopped 01/07/18 2130)  piperacillin-tazobactam (ZOSYN) IVPB 3.375 g (0 g Intravenous Stopped 01/07/18 2015)  naloxone (NARCAN) injection 0.4 mg (0.4 mg Intravenous Given 01/07/18 2142)     Initial Impression / Assessment and Plan / ED Course  I have reviewed the triage vital signs and the nursing notes.  Pertinent labs & imaging results that were available during my care of the patient were reviewed by me and considered in my medical decision making (see chart for details).  Clinical Course as of Jan 08 2156  Fri Jan 07, 2018  1956 Has AKI wit Cr of 2.5, last was normal CXR withotu findings In and out for urine Fluid rescucitation with 3cc / kg for hypotension and presumed septic shock. Bipap for respiratory acidosis on ABG  [BM]    Clinical Course User Index [BM] Noemi Chapel, MD    Critically ill  Consider Septic Shock, PNA, PTX, CHF  CO2 narcosis / hypercapnea,   Electrolyte, Overdose,    Labs, monitor, CT head, (on eliquis), CXR, nebs, admit  Labs with AKI, Fluids with slight improvement in BP CXR without infiltrated CT without stroke / hemorrhage UA with no UTI,  Drug screen shows benzo's / opiates - explains encephalopathy  D/w Dr. Olevia Bowens  who will admit Narcan with minimal improvement  Final Clinical Impressions(s) / ED Diagnoses   Final diagnoses:  Acute encephalopathy  Hypotension, unspecified hypotension type  AKI (acute kidney injury) ([place])  Respiratory acidosis      Noemi Chapel, MD 01/07/18 2157

## 2018-01-07 NOTE — ED Notes (Signed)
Critical result   Trop 0.04  Dr Sabra Heck apprised

## 2018-01-07 NOTE — ED Triage Notes (Signed)
Fell earlier tonight- third fall in 3 weeks  Has missed 2 doctors appts- once due to D and an appty yesterday because she forgot

## 2018-01-07 NOTE — H&P (Signed)
4        History and Physical    Mary [name] WIO:[id number] DOB: December 23, 1950 DOA: 01/07/2018  PCP: Celene Squibb, MD   Patient coming from: Home via EMS.  I have personally briefly reviewed patient's old medical records in [place]  Chief Complaint: Altered mental status.  HPI: Mary [name] is a 67 y.o. female with medical history significant of anxiety, osteoarthritis, atrial fibrillation, bronchial asthma, diastolic CHF, chronic back pain/degenerative disc disease with radicular symptoms, COPD, type 2 diabetes, diabetic peripheral neuropathy, CAD/CABG, hyperlipidemia, hypertension who is brought to the emergency department due to altered mental status.  She is unable to provide further history at this time, but per Dr. Sabra Heck and neighbor found her to be altered and incoherent earlier today.  She apparently has stated to the neighbor that she does not want to do it anymore, which is concerning given her history of depression.   ED Course: Initial vital signs temperature 36.6C (97.9 F), pulse 77, respirations 24, blood pressure 86/50 and O2 sat 77% on room air.  The patient received supplemental oxygen, albuterol 5 mg via neb, BiPAP ventilation, 1750 mL normal saline bolus naloxone 0.4 mg IV x1, Zosyn and vancomycin per pharmacy.  Her workup shows an ABG with a pH of 7.21, PCO2 of 16.9 and PO2 of 133 mmHg on a LPM of oxygen via [place].  All other ABG values were normal.    Urine analysis did not show signs of infection, hemoglobinuria proteinuria or ketonuria.  Urine drug screen was positive for opiates and benzodiazepines (I do not see an opiate prescription and her medication list).    Blood cultures x2 were drawn.  Her CBC shows a white count of 9.7 with a normal differential, hemoglobin 10.8 g/dL and platelets 215.  Troponin was 0.04 and digoxin level was 1.5 ng/mL.  BNP was 269 pg/mL.  Lactic acid x2 1.4 then 0.57 mmol/L.  Sodium was 130, potassium 4.8, chloride 92 and CO2 24 mmol/L.   Glucose 95, BUN 47 and creatinine 255 mg/dL (most recent 12/0 0.35 mg/dL on 11/18/2007).AST was 268 and ALT 252 U/L.  These values have been normal or low in the past.  The rest of the LFTs are within normal limits.  Salicylate, ethanol and acetaminophen levels were normal. EKG was atrial fibrillation with a ventricular rate of 82 bpm showing a right bundle branch block, which is not significantly changed from previous.  Imaging: Her chest radiograph shows cardiomegaly and mild vascular congestion. CT head without contrast did not show any acute intracranial pathology.  However he shows chronic small vessel ischemic changes.  Chronic appearing paranasal sinusitis.  Please see images and full radiology reports for further details.  Review of Systems: Unable to obtain at this time.   Past Medical History:  Diagnosis Date  . Anxiety   . Arthritis   . Atrial fibrillation ([place])   . Bronchial asthma   . CHF (congestive heart failure) ([place])   . Chronic pain    Bacl pain, Disc L5-S1- Dr. Joya Salm in [place]  . COPD (chronic obstructive pulmonary disease) ([place])   . DDD (degenerative disc disease)    Radicular symptoms  . Diabetes mellitus   . History of stress test 06/2011   Abnormal myocardial perfusion study.  Marland Kitchen Hx of echocardiogram    Was interpreted by Dr Doylene Canard that showed an Ef in the 50-60% range with grade 1 diastolic dysfunction. she had moderate MR, biatrial enlargement, moderate TR and  at that time estimated RV systolic pressure was 43 mm.  . Hyperlipidemia   . Hypertension   . Neuropathy   . Shortness of breath   . Tachycardia     Past Surgical History:  Procedure Laterality Date  . ABDOMINAL HYSTERECTOMY     partial  . Breast cyst removed    . CARPAL TUNNEL RELEASE     x 2  . CORONARY ARTERY BYPASS GRAFT N/A 01/09/2014   Procedure: CORONARY ARTERY BYPASS GRAFTING (CABG) x 3 using endoscopically harvested right saphenous vein and left internal mammary artery and closure of left  atrial appendage;  Surgeon: Ivin Poot, MD;  Location: [facility];  Service: Open Heart Surgery;  Laterality: N/A;  patient has preop IA BP   . INTRAOPERATIVE TRANSESOPHAGEAL ECHOCARDIOGRAM N/A 01/09/2014   Procedure: INTRAOPERATIVE TRANSESOPHAGEAL ECHOCARDIOGRAM;  Surgeon: Ivin Poot, MD;  Location: [facility];  Service: Open Heart Surgery;  Laterality: N/A;  . LEFT HEART CATHETERIZATION WITH CORONARY ANGIOGRAM N/A 01/07/2014   Procedure: LEFT HEART CATHETERIZATION WITH CORONARY ANGIOGRAM;  Surgeon: Lorretta Harp, MD;  Location: [facility] CATH LAB;  Service: Cardiovascular;  Laterality: N/A;  . TONSILLECTOMY    . TUBAL LIGATION       reports that she quit smoking about 4 years ago. Her smoking use included cigarettes. She has a 45.00 pack-year smoking history. she has never used smokeless tobacco. She reports that she does not drink alcohol or use drugs.  Allergies  Allergen Reactions  . Adhesive [Tape] Other (See Comments)    Tears skin off.   . Latex Other (See Comments)    In tape, tears skin off.  . Zocor [Simvastatin - High Dose] Nausea And Vomiting    Family History  Problem Relation Age of Onset  . Diabetes Mother   . Heart disease Mother   . Diabetes Sister   . Hypertension Sister   . Hyperlipidemia Sister   . Diabetes Brother   . Heart disease Brother   . Diabetes Brother   . Heart disease Brother     Prior to Admission medications   Medication Sig Start Date End Date Taking? Authorizing Provider  ACCU-CHEK AVIVA PLUS test strip USE TO TEST 3 TO 4 TIMES DAILY OR AS DIRECTED. 07/20/16   Alycia Rossetti, MD  acetaminophen (TYLENOL) 500 MG tablet Take 500 mg by mouth every 6 (six) hours as needed for mild pain.    [provider]  acetylcysteine (MUCOMYST) 20 % nebulizer solution Take 3 mLs by nebulization every 6 (six) hours. Patient not taking: Reported on 12/17/2017 02/24/17   Eber Jones, MD  albuterol (PROVENTIL HFA;VENTOLIN HFA) 108 [phone] Base) MCG/ACT  inhaler Inhale 1-2 puffs into the lungs every 6 (six) hours as needed for shortness of breath.    [provider]  ALPRAZolam Duanne Moron) 1 MG tablet Take 1 mg by mouth 4 (four) times daily.  03/10/16   [provider]  apixaban (ELIQUIS) 5 MG TABS tablet Take 1 tablet (5 mg total) by mouth 2 (two) times daily. Take place of Pradaxa 11/01/17   Troy Sine, MD  aspirin EC 81 MG tablet Take 81 mg by mouth daily.    [provider]  atorvastatin (LIPITOR) 40 MG tablet TAKE ONE TABLET BY MOUTH DAILY. 03/23/17   Troy Sine, MD  DIGOX 125 MCG tablet TAKE (1) TABLET BY MOUTH ONCE DAILY. 08/06/17   Troy Sine, MD  DULoxetine (CYMBALTA) 30 MG capsule Take 1 capsule by  mouth daily. 11/01/17   [provider]  fluticasone (FLONASE) 50 MCG/ACT nasal spray Place 2 sprays into both nostrils daily. 01/25/15   Alycia Rossetti, MD  furosemide (LASIX) 40 MG tablet TAKE ONE TABLET BY MOUTH ONCE DAILY. 01/22/17   Troy Sine, MD  gabapentin (NEURONTIN) 300 MG capsule Take 2 capsules by mouth 3 (three) times daily. 11/11/17   [provider]  glipiZIDE (GLUCOTROL XL) 2.5 MG 24 hr tablet 2.5 mg daily.  02/28/16   [provider]  JANUVIA 100 MG tablet TAKE ONE TABLET BY MOUTH DAILY. 07/19/15   Ririe, Modena Nunnery, MD  levalbuterol Penne Lash) 0.63 MG/3ML nebulizer solution Take 3 mLs (0.63 mg total) by nebulization every 6 (six) hours. 02/24/17   Eber Jones, MD  lisinopril (PRINIVIL,ZESTRIL) 2.5 MG tablet TAKE ONE TABLET BY MOUTH DAILY. 08/23/17   Troy Sine, MD  Magnesium Oxide 250 MG TABS Take 250 mg by mouth daily.     [provider]  metFORMIN (GLUCOPHAGE) 850 MG tablet TAKE 1 TABLET BY MOUTH TWICE DAILY WITH MEALS. 07/19/15   Alycia Rossetti, MD  metoprolol tartrate (LOPRESSOR) 50 MG tablet TAKE 2 TABLETS BY MOUTH IN THE MORNING AND 1 AND 1/2 TABLETS AT BEDTIME. 08/23/17   Troy Sine, MD  montelukast (SINGULAIR) 10 MG tablet Take 1  tablet by mouth daily. 01/21/17   [provider]  Multiple Vitamin (MULTIVITAMIN) capsule Take 1 capsule by mouth daily.    [provider]  potassium chloride SA (K-DUR,KLOR-CON) 20 MEQ tablet Take 1 tablet (20 mEq total) by mouth daily. 11/06/16   Thurnell Lose, MD  sodium chloride (OCEAN) 0.65 % nasal spray Place 1 spray into the nose as needed for congestion. Congestion    [provider]  SYMBICORT 160-4.5 MCG/ACT inhaler INHALE 2 PUFFS INTO THE LUNGS TWICE DAILY. RINSE MOUTH AFTER USE. 06/04/15   Alycia Rossetti, MD    Physical Exam: Vitals:   01/07/18 2050 01/07/18 2100 01/07/18 2130 01/07/18 2230  BP:  (!) 87/48 (!) 96/59 (!) 103/50  Pulse:    69  Resp:  14 13 14   Temp: (!) 96.7 F (35.9 C)     TempSrc: Rectal     SpO2:    100%  Weight:      Height:        Constitutional: NAD, calm, comfortable Eyes: PERRL, lids and conjunctivae normal ENMT: BiPAP mask on.  Mucous membranes are dry. Posterior pharynx clear of any exudate or lesions. Neck: normal, supple, no masses, no thyromegaly Respiratory: Mild rhonchi bilaterally, no wheezing, no crackles. Normal respiratory effort. No accessory muscle use.  Cardiovascular: Irregularly irregular, no murmurs / rubs / gallops. No extremity edema. 2+ pedal pulses. No carotid bruits.  Abdomen: Soft, no tenderness, no masses palpated. No hepatosplenomegaly. Bowel sounds positive.  Musculoskeletal: Mild clubbing.  No cyanosis.  Good ROM, no contractures. Normal muscle tone.  Skin: no rashes, lesions, ulcers on very limited dermatological exam. Neurologic: CN 2-12 grossly intact. Sensation intact, DTR normal. Strength 5/5 in all 4.  Psychiatric: Somnolent.  Wakes up briefly, oriented to name and place, partially oriented to time and date and situation.  Labs on Admission: I have personally reviewed following labs and imaging studies  CBC: Recent Labs  Lab 01/07/18 1909  WBC 9.7  NEUTROABS 5.9  HGB 10.8*    HCT 33.9*  MCV 88.1  PLT 950   Basic Metabolic Panel: Recent Labs  Lab 01/07/18 1909  NA  130*  K 4.8  CL 92*  CO2 24  GLUCOSE 95  BUN 47*  CREATININE 2.55*  CALCIUM 8.9   GFR: Estimated Creatinine Clearance: 16.8 mL/min (A) (by C-G formula based on SCr of 2.55 mg/dL (H)). Liver Function Tests: Recent Labs  Lab 01/07/18 1909  AST 268*  ALT 252*  ALKPHOS 71  BILITOT 0.6  PROT 7.0  ALBUMIN 3.7   No results for input(s): LIPASE, AMYLASE in the last 168 hours. No results for input(s): AMMONIA in the last 168 hours. Coagulation Profile: No results for input(s): INR, PROTIME in the last 168 hours. Cardiac Enzymes: Recent Labs  Lab 01/07/18 1909  TROPONINI 0.04*   BNP (last 3 results) No results for input(s): PROBNP in the last 8760 hours. HbA1C: No results for input(s): HGBA1C in the last 72 hours. CBG: No results for input(s): GLUCAP in the last 168 hours. Lipid Profile: No results for input(s): CHOL, HDL, LDLCALC, TRIG, CHOLHDL, LDLDIRECT in the last 72 hours. Thyroid Function Tests: No results for input(s): TSH, T4TOTAL, FREET4, T3FREE, THYROIDAB in the last 72 hours. Anemia Panel: No results for input(s): VITAMINB12, FOLATE, FERRITIN, TIBC, IRON, RETICCTPCT in the last 72 hours. Urine analysis:    Component Value Date/Time   COLORURINE AMBER (A) 01/07/2018 2048   APPEARANCEUR CLOUDY (A) 01/07/2018 2048   LABSPEC 1.014 01/07/2018 2048   PHURINE 5.0 01/07/2018 2048   GLUCOSEU NEGATIVE 01/07/2018 2048   HGBUR NEGATIVE 01/07/2018 2048   BILIRUBINUR NEGATIVE 01/07/2018 2048   KETONESUR NEGATIVE 01/07/2018 2048   PROTEINUR NEGATIVE 01/07/2018 2048   UROBILINOGEN 0.2 02/17/2015 2040   NITRITE NEGATIVE 01/07/2018 2048   LEUKOCYTESUR NEGATIVE 01/07/2018 2048    Radiological Exams on Admission: Ct Head Wo Contrast  Result Date: 01/07/2018 CLINICAL DATA:  Altered level of consciousness. Multiple falls. Weakness. EXAM: CT HEAD WITHOUT CONTRAST TECHNIQUE:  Contiguous axial images were obtained from the base of the skull through the vertex without intravenous contrast. COMPARISON:  10/25/2013 head CT. FINDINGS: Brain: No evidence of parenchymal hemorrhage or extra-axial fluid collection. No mass lesion, mass effect, or midline shift. No CT evidence of acute infarction. Nonspecific mild subcortical and periventricular white matter hypodensity, most in keeping with chronic small vessel ischemic change. Cerebral volume is age appropriate. No ventriculomegaly. Vascular: No acute abnormality. Skull: No evidence of calvarial fracture. Sinuses/Orbits: Mucoperiosteal thickening throughout paranasal sinuses, most prominent in the left maxillary sinus. No fluid levels. Other: Mild scattered gas in the right deep maxillary soft tissues is presumably iatrogenic related to peripheral IV placement. The mastoid air cells are unopacified. IMPRESSION: 1.  No evidence of acute intracranial abnormality. 2. Mild chronic small vessel ischemic changes in the cerebral white matter. 3. Chronic appearing paranasal sinusitis. Electronically Signed   By: Ilona Sorrel M.D.   On: 01/07/2018 21:30   Dg Chest Port 1 View  Result Date: 01/07/2018 CLINICAL DATA:  Cough and dyspnea. Patient fell earlier this evening. Third fall in 3 weeks. EXAM: PORTABLE CHEST 1 VIEW COMPARISON:  12/22/2017 FINDINGS: Heart is top-normal in size. There is aortic atherosclerosis at the arch without aneurysm. The patient is status post median sternotomy and post CABG change. Left atrial appendage clip is also redemonstrated. Pulmonary vascular congestion is noted without acute pneumonic consolidation, effusion or pneumothorax. No acute osseous abnormality. IMPRESSION: Status post CABG with borderline cardiomegaly. Aortic atherosclerosis. Mild pulmonary vascular congestion. Electronically Signed   By: Ashley Royalty M.D.   On: 01/07/2018 19:58   01/16/2016 echocardiogram  complete ------------------------------------------------------------------- LV EF:  55% -   60%  ------------------------------------------------------------------- History:   PMH:   Dyspnea.  Atrial fibrillation.  Coronary artery disease.  Chronic obstructive pulmonary disease.  Risk factors: Former tobacco use. Hypertension. Dyslipidemia.  ------------------------------------------------------------------- Study Conclusions  - Left ventricle: The cavity size was normal. Wall thickness was   normal. Systolic function was normal. The estimated ejection   fraction was in the range of 55% to 60%. Wall motion was normal;   there were no regional wall motion abnormalities. - Aortic valve: There was trivial regurgitation. Valve area (Vmax):   0.93 cm^2. - Mitral valve: Calcified annulus. There was mild regurgitation. - Left atrium: The atrium was moderately dilated. - Right atrium: The atrium was mildly dilated. - Pulmonary arteries: Systolic pressure was mildly increased.  Impressions:  - Normal LV systolic function; biatrial enlargment; trace AI; mild   MR and TR; mildly elevated pulmonary pressure  EKG: Independently reviewed. Vent. rate 82 BPM PR interval * ms QRS duration 149 ms QT/QTc 391/457 ms P-R-T axes * 117 -24 Atrial fibrillation Right bundle branch block  Assessment/Plan Principal Problem:   Acute respiratory failure with hypercapnia (HCC)   COPD with acute exacerbation (HCC) Admit to stepdown/inpatient. Continue supplemental oxygen. Continue BiPAP ventilation.   Active Problems:   Sepsis due to undetermined organism ([place]) Received a normal saline bolus of 2750 mL in the ER. Continue gentle IV fluids. Continue vancomycin and Zosyn per pharmacy. Follow-up blood cultures and sensitivity.    Altered mental status Secondary to hypoxia and sepsis. Improved after Narcan. Continue treatment for above. Supportive care.    AKI (acute kidney injury)  ([place]) Received normal saline bolus in ED. Hold furosemide, potassium supplement, lisinopril and metformin. Continue gentle IV fluids. Monitor intake and output.    Diastolic congestive heart failure (HCC) No signs of decompensation. Intake and output follow-up.    Hyperlipidemia Resume atorvastatin if total CK normal    Anxiety/Depression Continue alprazolam 1 mg p.o. every 4 hours as needed Continue duloxetine 30 mg p.o. Daily.    Atrial fibrillation (HCC) CHA?DS?-VASc Score of at least 5. Resume Eliquis and beta-blocker later today or start parenteral anticoagulation if her mental status does not improve.    CAD (coronary artery disease) Resume aspirin, Eliquis, beta-blocker and statin once mental status is better. She will also need medication reconciliation or electronic retrieval of her med list.    HTN (hypertension) Hold antihypertensives for now due to hypotension.    Hyponatremia Diuretic, decreased oral intake or GI losses?  Received normal saline bolus. Continue with gentle normal saline infusion. Follow-up sodium level.    Anemia Mildly increased from baseline, likely hemoconcentration. Low iron and saturation ratio 4 years ago. Monitor hematocrit and hemoglobin.   DVT prophylaxis: On apixaban. Code Status: Full code. Family Communication:  Disposition Plan: Admit for respiratory failure/COPD exacerbation, AKI, hyponatremia, encephalopathy close monitoring and Treatment. Consults called:  Admission status: Inpatient/stepdown.   Reubin Milan MD [facility] Pager [phone].  If 7PM-7AM, please contact night-coverage [email or web address] Password [email or web address]  01/07/2018, 11:10 PM

## 2018-01-08 ENCOUNTER — Inpatient Hospital Stay (HOSPITAL_COMMUNITY): Payer: Medicare Other

## 2018-01-08 DIAGNOSIS — I5032 Chronic diastolic (congestive) heart failure: Secondary | ICD-10-CM

## 2018-01-08 DIAGNOSIS — A419 Sepsis, unspecified organism: Secondary | ICD-10-CM

## 2018-01-08 DIAGNOSIS — I4891 Unspecified atrial fibrillation: Secondary | ICD-10-CM

## 2018-01-08 DIAGNOSIS — I959 Hypotension, unspecified: Secondary | ICD-10-CM

## 2018-01-08 DIAGNOSIS — R740 Nonspecific elevation of levels of transaminase and lactic acid dehydrogenase [LDH]: Secondary | ICD-10-CM

## 2018-01-08 DIAGNOSIS — I4510 Unspecified right bundle-branch block: Secondary | ICD-10-CM

## 2018-01-08 DIAGNOSIS — J9601 Acute respiratory failure with hypoxia: Secondary | ICD-10-CM

## 2018-01-08 DIAGNOSIS — G9341 Metabolic encephalopathy: Secondary | ICD-10-CM

## 2018-01-08 DIAGNOSIS — J9602 Acute respiratory failure with hypercapnia: Secondary | ICD-10-CM

## 2018-01-08 DIAGNOSIS — E871 Hypo-osmolality and hyponatremia: Secondary | ICD-10-CM

## 2018-01-08 DIAGNOSIS — N179 Acute kidney failure, unspecified: Secondary | ICD-10-CM

## 2018-01-08 DIAGNOSIS — J441 Chronic obstructive pulmonary disease with (acute) exacerbation: Principal | ICD-10-CM

## 2018-01-08 DIAGNOSIS — R7401 Elevation of levels of liver transaminase levels: Secondary | ICD-10-CM

## 2018-01-08 DIAGNOSIS — Z9114 Patient's other noncompliance with medication regimen: Secondary | ICD-10-CM

## 2018-01-08 LAB — COMPREHENSIVE METABOLIC PANEL
ALBUMIN: 2.7 g/dL — AB (ref 3.5–5.0)
ALT: 185 U/L — AB (ref 14–54)
AST: 176 U/L — AB (ref 15–41)
Alkaline Phosphatase: 58 U/L (ref 38–126)
Anion gap: 10 (ref 5–15)
BUN: 39 mg/dL — AB (ref 6–20)
CHLORIDE: 99 mmol/L — AB (ref 101–111)
CO2: 23 mmol/L (ref 22–32)
CREATININE: 1.77 mg/dL — AB (ref 0.44–1.00)
Calcium: 7.9 mg/dL — ABNORMAL LOW (ref 8.9–10.3)
GFR calc Af Amer: 33 mL/min — ABNORMAL LOW (ref >60)
GFR calc non Af Amer: 28 mL/min — ABNORMAL LOW (ref >60)
GLUCOSE: 83 mg/dL (ref 65–99)
POTASSIUM: 4.5 mmol/L (ref 3.5–5.1)
SODIUM: 132 mmol/L — AB (ref 135–145)
Total Bilirubin: 0.9 mg/dL (ref 0.3–1.2)
Total Protein: 5.5 g/dL — ABNORMAL LOW (ref 6.5–8.1)

## 2018-01-08 LAB — CBC WITH DIFFERENTIAL/PLATELET
BASOS ABS: 0 10*3/uL (ref 0.0–0.1)
Basophils Relative: 0 %
Eosinophils Absolute: 0.1 10*3/uL (ref 0.0–0.7)
Eosinophils Relative: 1 %
HCT: 28.8 % — ABNORMAL LOW (ref 36.0–46.0)
Hemoglobin: 8.9 g/dL — ABNORMAL LOW (ref 12.0–15.0)
LYMPHS ABS: 1.8 10*3/uL (ref 0.7–4.0)
LYMPHS PCT: 24 %
MCH: 28 pg (ref 26.0–34.0)
MCHC: 30.9 g/dL (ref 30.0–36.0)
MCV: 90.6 fL (ref 78.0–100.0)
MONO ABS: 1 10*3/uL (ref 0.1–1.0)
Monocytes Relative: 13 %
Neutro Abs: 4.6 10*3/uL (ref 1.7–7.7)
Neutrophils Relative %: 62 %
PLATELETS: 182 10*3/uL (ref 150–400)
RBC: 3.18 MIL/uL — AB (ref 3.87–5.11)
RDW: 16 % — AB (ref 11.5–15.5)
WBC: 7.5 10*3/uL (ref 4.0–10.5)

## 2018-01-08 LAB — BLOOD GAS, ARTERIAL
Acid-Base Excess: 3 mmol/L — ABNORMAL HIGH (ref 0.0–2.0)
BICARBONATE: 21 mmol/L (ref 20.0–28.0)
DELIVERY SYSTEMS: POSITIVE
DRAWN BY: 22223
Expiratory PAP: 6
FIO2: 40
Inspiratory PAP: 12
LHR: 8 {breaths}/min
O2 Saturation: 94.5 %
pCO2 arterial: 61.8 mmHg — ABNORMAL HIGH (ref 32.0–48.0)
pH, Arterial: 7.211 — ABNORMAL LOW (ref 7.350–7.450)
pO2, Arterial: 93 mmHg (ref 83.0–108.0)

## 2018-01-08 LAB — CBG MONITORING, ED
GLUCOSE-CAPILLARY: 81 mg/dL (ref 65–99)
Glucose-Capillary: 210 mg/dL — ABNORMAL HIGH (ref 65–99)

## 2018-01-08 LAB — MRSA PCR SCREENING: MRSA by PCR: NEGATIVE

## 2018-01-08 LAB — PROCALCITONIN: Procalcitonin: 0.49 ng/mL

## 2018-01-08 LAB — GLUCOSE, CAPILLARY
GLUCOSE-CAPILLARY: 368 mg/dL — AB (ref 65–99)
GLUCOSE-CAPILLARY: 341 mg/dL — AB (ref 65–99)

## 2018-01-08 LAB — CK: CK TOTAL: 487 U/L — AB (ref 38–234)

## 2018-01-08 LAB — TROPONIN I: TROPONIN I: 0.03 ng/mL — AB (ref <0.03)

## 2018-01-08 MED ORDER — ORAL CARE MOUTH RINSE
15.0000 mL | Freq: Two times a day (BID) | OROMUCOSAL | Status: DC
  Administered 2018-01-09 – 2018-01-12 (×5): 15 mL via OROMUCOSAL

## 2018-01-08 MED ORDER — ORAL CARE MOUTH RINSE
15.0000 mL | Freq: Two times a day (BID) | OROMUCOSAL | Status: DC
  Administered 2018-01-08: 15 mL via OROMUCOSAL

## 2018-01-08 MED ORDER — METHYLPREDNISOLONE SODIUM SUCC 125 MG IJ SOLR
60.0000 mg | Freq: Four times a day (QID) | INTRAMUSCULAR | Status: DC
  Administered 2018-01-08 – 2018-01-09 (×3): 60 mg via INTRAVENOUS
  Filled 2018-01-08 (×3): qty 2

## 2018-01-08 MED ORDER — ASPIRIN 81 MG PO CHEW
81.0000 mg | CHEWABLE_TABLET | Freq: Every day | ORAL | Status: DC
  Administered 2018-01-08 – 2018-01-12 (×5): 81 mg via ORAL
  Filled 2018-01-08 (×5): qty 1

## 2018-01-08 MED ORDER — APIXABAN 2.5 MG PO TABS
2.5000 mg | ORAL_TABLET | Freq: Two times a day (BID) | ORAL | Status: DC
  Administered 2018-01-08 – 2018-01-09 (×3): 2.5 mg via ORAL
  Filled 2018-01-08 (×7): qty 1

## 2018-01-08 MED ORDER — LEVALBUTEROL HCL 1.25 MG/0.5ML IN NEBU
1.2500 mg | INHALATION_SOLUTION | Freq: Four times a day (QID) | RESPIRATORY_TRACT | Status: DC
  Administered 2018-01-08 – 2018-01-12 (×17): 1.25 mg via RESPIRATORY_TRACT
  Filled 2018-01-08 (×18): qty 0.5

## 2018-01-08 MED ORDER — PIPERACILLIN-TAZOBACTAM 3.375 G IVPB
3.3750 g | Freq: Three times a day (TID) | INTRAVENOUS | Status: DC
  Administered 2018-01-08 – 2018-01-09 (×3): 3.375 g via INTRAVENOUS
  Filled 2018-01-08 (×3): qty 50

## 2018-01-08 MED ORDER — SODIUM CHLORIDE 0.9 % IV SOLN
INTRAVENOUS | Status: DC
  Administered 2018-01-08 – 2018-01-10 (×3): via INTRAVENOUS

## 2018-01-08 MED ORDER — ENSURE ENLIVE PO LIQD
237.0000 mL | Freq: Two times a day (BID) | ORAL | Status: DC
  Administered 2018-01-09 – 2018-01-12 (×6): 237 mL via ORAL

## 2018-01-08 MED ORDER — LORAZEPAM 2 MG/ML IJ SOLN
0.5000 mg | INTRAMUSCULAR | Status: DC | PRN
Start: 2018-01-08 — End: 2018-01-12
  Administered 2018-01-09 – 2018-01-10 (×2): 0.5 mg via INTRAVENOUS
  Filled 2018-01-08 (×2): qty 1

## 2018-01-08 MED ORDER — METHYLPREDNISOLONE SODIUM SUCC 125 MG IJ SOLR
125.0000 mg | Freq: Once | INTRAMUSCULAR | Status: AC
  Administered 2018-01-08: 125 mg via INTRAVENOUS
  Filled 2018-01-08: qty 2

## 2018-01-08 MED ORDER — LORAZEPAM 2 MG/ML IJ SOLN: 0.5000 mg | Freq: Once | INTRAMUSCULAR | Status: DC

## 2018-01-08 MED ORDER — LEVALBUTEROL HCL 0.63 MG/3ML IN NEBU: 0.6300 mg | INHALATION_SOLUTION | Freq: Four times a day (QID) | RESPIRATORY_TRACT | Status: DC | PRN

## 2018-01-08 MED ORDER — BUDESONIDE 0.5 MG/2ML IN SUSP
0.5000 mg | Freq: Two times a day (BID) | RESPIRATORY_TRACT | Status: DC
  Administered 2018-01-08 – 2018-01-12 (×8): 0.5 mg via RESPIRATORY_TRACT
  Filled 2018-01-08 (×12): qty 2

## 2018-01-08 MED ORDER — PIPERACILLIN-TAZOBACTAM 3.375 G IVPB: 3.3750 g | Freq: Two times a day (BID) | INTRAVENOUS | Status: DC

## 2018-01-08 MED ORDER — METFORMIN HCL 850 MG PO TABS
850.0000 mg | ORAL_TABLET | Freq: Two times a day (BID) | ORAL | Status: DC
  Filled 2018-01-08 (×5): qty 1

## 2018-01-08 MED ORDER — VANCOMYCIN HCL 500 MG IV SOLR
500.0000 mg | INTRAVENOUS | Status: DC
  Administered 2018-01-08: 500 mg via INTRAVENOUS
  Filled 2018-01-08 (×2): qty 500

## 2018-01-08 MED ORDER — IPRATROPIUM BROMIDE 0.02 % IN SOLN
0.5000 mg | Freq: Four times a day (QID) | RESPIRATORY_TRACT | Status: DC
  Administered 2018-01-08 – 2018-01-12 (×17): 0.5 mg via RESPIRATORY_TRACT
  Filled 2018-01-08 (×18): qty 2.5

## 2018-01-08 MED ORDER — DIGOXIN 125 MCG PO TABS
0.1250 mg | ORAL_TABLET | Freq: Every day | ORAL | Status: DC
  Filled 2018-01-08 (×2): qty 1

## 2018-01-08 MED ORDER — CHLORHEXIDINE GLUCONATE 0.12 % MT SOLN
15.0000 mL | Freq: Two times a day (BID) | OROMUCOSAL | Status: DC
  Administered 2018-01-08 – 2018-01-12 (×8): 15 mL via OROMUCOSAL
  Filled 2018-01-08 (×8): qty 15

## 2018-01-08 MED ORDER — APIXABAN 5 MG PO TABS: 5.0000 mg | ORAL_TABLET | Freq: Two times a day (BID) | ORAL | Status: DC

## 2018-01-08 NOTE — Progress Notes (Signed)
Pharmacy Antibiotic Note  Mary [name] is a 67 y.o. female admitted on 01/07/2018 with sepsis.  Pharmacy has been consulted for Vancomycin and Zosyn dosing.  Plan: Vancomycin 1000 mg IV x 1 dose Vancomycin 500 mg IV every 24 hours.  Goal trough 15-20 mcg/mL. Zosyn 3.375g IV q8h (4 hour infusion).  Monitor labs, cultures, and vanco trough as needed  Height: 5\' 4"  (162.6 cm) Weight: 111 lb (50.3 kg) IBW/kg (Calculated) : 54.7  Temp (24hrs), Avg:97.3 F (36.3 C), Min:96.7 F (35.9 C), Max:97.9 F (36.6 C)  Recent Labs  Lab 01/07/18 1909 01/07/18 1937 01/07/18 2030 01/08/18 0600  WBC 9.7  --   --  7.5  CREATININE 2.55*  --   --  1.77*  LATICACIDVEN  --  1.4 0.57  --     Estimated Creatinine Clearance: 24.2 mL/min (A) (by C-G formula based on SCr of 1.77 mg/dL (H)).    Allergies  Allergen Reactions  . Adhesive [Tape] Other (See Comments)    Tears skin off.   . Latex Other (See Comments)    In tape, tears skin off.  . Zocor [Simvastatin - High Dose] Nausea And Vomiting    Antimicrobials this admission: Vanco 3/16 >>  Zosyn 3/16 >>   Dose adjustments this admission: N/a  Microbiology results: 3/15 BCx: NG x 12 hours 3/15 UCx: pending    Thank you for allowing pharmacy to be a part of this patient's care.  Ramond Craver 01/08/2018 10:08 AM

## 2018-01-08 NOTE — ED Notes (Signed)
Decreased oxygen to 4 liters [place]

## 2018-01-08 NOTE — ED Notes (Signed)
Dr. Olevia Bowens paged at this time for low b/p

## 2018-01-08 NOTE — Progress Notes (Signed)
PROGRESS NOTE  Mary [name] QMG:[id number] DOB: Dec 13, 1950 DOA: 01/07/2018 PCP: Celene Squibb, MD  Brief History:  67 year old female with a history of atrial fibrillation, diastolic CHF, COPD, diabetes mellitus, hyperlipidemia, hypertension, COPD, coronary artery disease with CABG presenting with altered mental status.  The patient's neighbor went to check up on the patient and found the patient confused and somnolent.  EMS was activated.  Upon arrival, the patient was noted to have oxygen saturation 77% on room air.  The patient was placed on BiPAP.  In the emergency department, the patient was given Narcan with improvement of her mental status.  Initial ABG showed 7.21/66/133/22 on 8L HFNC.  She was noted to be hypotensive with blood pressure 86/50.  The patient was given the appropriate fluid boluses and started on intravenous vancomycin and Zosyn.  Urinalysis was negative for pyuria.  Chest x-ray showed pulmonary vascular congestion without consolidation.  CT of the brain was unremarkable.  On the morning of 01/08/2018, the patient was more alert, but not yet back to baseline.  Notably, the patient was recently admitted to the hospital from 11/15/17 through 11/17/17 for pneumonia and COPD exacerbation.  Assessment/Plan: Acute respiratory failure with hypercarbia and hypoxia -Secondary to COPD exacerbation -Wean off BiPAP -Pulmonary hygiene  COPD exacerbation -Start Pulmicort -Continue duo nebs -Start IV steroids  SIRS -Source of infection unclear -Lactic acid peaked 1.4 -Continue vancomycin and Zosyn pending culture data -Check procalcitonin -Personally reviewed chest x-ray--vascular congestion without frank pulmonary edema -Urinalysis negative for pyuria  Acute metabolic encephalopathy  -Multifactorial including hypercarbia, AKI, medications including opioids and benzodiazepines, and an infectious process -01/08/2018--more alert, but not back to baseline  AKI -Secondary to  volume depletion and hemodynamic derangement -Improving with IV fluids -Baseline creatinine 0.3-0.5 -Presenting creatinine 2.55  Transaminasemia -likely due to sepsis -RUQ [place] -hep B surface antigen -hep C antibody  Opioid Abuse -Patient was noted to have opioids on her UDS -The [place] Controlled Substance Reporting System has been queried for this patient--last opioid Rx filled  06/07/17--hydrocodone 5/325,#42 -Unclear if she took leftover opioid or she is getting opioids elsewhere  Chronic diastolic CHF -Judicious IV fluids -Respiratory status remained stable/improving despite fluid boluses for sepsis -Hold Lasix for today in the setting of AKI and hypotension -01/15/2016 Echo EF 55-60%, no WMA, mild TR/MR -Daily weights  Atrial fibrillation, unspecified type -Rate controlled -Continue apixaban -Hold digoxin today in the setting of AKI -Plan to restart digoxin 01/09/2018  Diabetes mellitus type 2 -11/17/2017 hemoglobin A1c 6.3 -Holding metformin, Januvia and glipizide -NovoLog sliding scale  Coronary artery disease status post CABG -No chest pain presently -Personally reviewed EKG--atrial fibrillation, unchanged RBBB -Continue statin and aspirin -Holding metoprolol secondary to hypotension     Disposition Plan:   Home in 2-3 days  Family Communication:  No Family at bedside  Consultants:  none  Code Status:  FULL   DVT Prophylaxis:  apixaban   Procedures: As Listed in Progress Note Above  Antibiotics: vanco 3/15>>> Zosyn 3/15>>>    Subjective: Patient denies fevers, chills, headache, chest pain,  nausea, vomiting, diarrhea, abdominal pain, dysuria, hematuria, hematochezia, and melena.  Patient still complains of shortness of breath, but improved from yesterday.  Complains of a nonproductive cough.  Denies any headache, fevers, chills.   Objective: Vitals:   01/08/18 0440 01/08/18 0500 01/08/18 0530 01/08/18 0645  BP: (!) 90/57 (!) 95/54 (!)  92/48 (!) 90/56  Pulse: 75 71 75  74  Resp: 10 13 12 12   Temp:      TempSrc:      SpO2: 93% 92% 93% 94%  Weight:      Height:        Intake/Output Summary (Last 24 hours) at 01/08/2018 0728 Last data filed at 01/08/2018 0313 Gross per 24 hour  Intake 800 ml  Output -  Net 800 ml   Weight change:  Exam:   General:  Pt is alert, follows commands appropriately, not in acute distress  HEENT: No icterus, No thrush, No neck mass, [place]/AT  Cardiovascular: IRRR, S1/S2, no rubs, no gallops  Respiratory: Bilateral rhonchi.  Bilateral expiratory wheeze.  Abdomen: Soft/+BS, non tender, non distended, no guarding  Extremities: trace LE edema, No lymphangitis, No petechiae, No rashes, no synovitis   Data Reviewed: I have personally reviewed following labs and imaging studies Basic Metabolic Panel: Recent Labs  Lab 01/07/18 1909 01/08/18 0600  NA 130* 132*  K 4.8 4.5  CL 92* 99*  CO2 24 23  GLUCOSE 95 83  BUN 47* 39*  CREATININE 2.55* 1.77*  CALCIUM 8.9 7.9*   Liver Function Tests: Recent Labs  Lab 01/07/18 1909 01/08/18 0600  AST 268* 176*  ALT 252* 185*  ALKPHOS 71 58  BILITOT 0.6 0.9  PROT 7.0 5.5*  ALBUMIN 3.7 2.7*   No results for input(s): LIPASE, AMYLASE in the last 168 hours. No results for input(s): AMMONIA in the last 168 hours. Coagulation Profile: No results for input(s): INR, PROTIME in the last 168 hours. CBC: Recent Labs  Lab 01/07/18 1909 01/08/18 0600  WBC 9.7 7.5  NEUTROABS 5.9 PENDING  HGB 10.8* 8.9*  HCT 33.9* 28.8*  MCV 88.1 90.6  PLT 215 182   Cardiac Enzymes: Recent Labs  Lab 01/07/18 1909 01/08/18 0600  CKTOTAL  --  487*  TROPONINI 0.04* 0.03*   BNP: Invalid input(s): POCBNP CBG: No results for input(s): GLUCAP in the last 168 hours. HbA1C: No results for input(s): HGBA1C in the last 72 hours. Urine analysis:    Component Value Date/Time   COLORURINE AMBER (A) 01/07/2018 2048   APPEARANCEUR CLOUDY (A) 01/07/2018 2048     LABSPEC 1.014 01/07/2018 2048   PHURINE 5.0 01/07/2018 2048   GLUCOSEU NEGATIVE 01/07/2018 2048   HGBUR NEGATIVE 01/07/2018 2048   BILIRUBINUR NEGATIVE 01/07/2018 2048   KETONESUR NEGATIVE 01/07/2018 2048   PROTEINUR NEGATIVE 01/07/2018 2048   UROBILINOGEN 0.2 02/17/2015 2040   NITRITE NEGATIVE 01/07/2018 2048   LEUKOCYTESUR NEGATIVE 01/07/2018 2048   Sepsis Labs: @LABRCNTIP (procalcitonin:4,lacticidven:4) ) Recent Results (from the past 240 hour(s))  Blood culture (routine x 2)     Status: None (Preliminary result)   Collection Time: 01/07/18  7:30 PM  Result Value Ref Range Status   Specimen Description RIGHT ANTECUBITAL  Final   Special Requests   Final    BOTTLES DRAWN AEROBIC ONLY Blood Culture adequate volume Performed at [facility], [address] [place], [place] [address]    Culture PENDING  Incomplete   Report Status PENDING  Incomplete  Blood culture (routine x 2)     Status: None (Preliminary result)   Collection Time: 01/07/18  7:37 PM  Result Value Ref Range Status   Specimen Description BLOOD LEFT FOREARM  Final   Special Requests   Final    BOTTLES DRAWN AEROBIC AND ANAEROBIC Blood Culture adequate volume Performed at [facility], [address], [place], [place] [address]    Culture PENDING  Incomplete  Report Status PENDING  Incomplete     Scheduled Meds: . apixaban  5 mg Oral BID  . budesonide (PULMICORT) nebulizer solution  0.5 mg Nebulization BID  . insulin aspart  0-9 Units Subcutaneous TID WC  . ipratropium  0.5 mg Nebulization Q6H  . levalbuterol  1.25 mg Nebulization Q6H  . methylPREDNISolone (SOLU-MEDROL) injection  125 mg Intravenous Once  . methylPREDNISolone (SOLU-MEDROL) injection  60 mg Intravenous Q6H   Continuous Infusions: . sodium chloride 50 mL/hr at 01/08/18 0552  . piperacillin-tazobactam (ZOSYN)  IV Stopped (01/08/18 0313)  . [START ON 01/09/2018] vancomycin      Procedures/Studies: Dg Chest 2 View  Result Date:  12/22/2017 CLINICAL DATA:  COPD.  Cough.  Recent pneumonia. EXAM: CHEST  2 VIEW COMPARISON:  11/15/2017. FINDINGS: Normal sized heart. The previously demonstrated left mid lung airspace opacity has cleared. Both lungs are currently clear. The lungs remain hyperexpanded with mild diffuse peribronchial thickening. Stable post CABG changes and left atrial clip. Diffuse osteopenia and mild thoracolumbar spine degenerative changes. IMPRESSION: 1. Resolved left lung pneumonia. 2. Stable changes of COPD and chronic bronchitis. Electronically Signed   By: Claudie Revering M.D.   On: 12/22/2017 16:15   Ct Head Wo Contrast  Result Date: 01/07/2018 CLINICAL DATA:  Altered level of consciousness. Multiple falls. Weakness. EXAM: CT HEAD WITHOUT CONTRAST TECHNIQUE: Contiguous axial images were obtained from the base of the skull through the vertex without intravenous contrast. COMPARISON:  10/25/2013 head CT. FINDINGS: Brain: No evidence of parenchymal hemorrhage or extra-axial fluid collection. No mass lesion, mass effect, or midline shift. No CT evidence of acute infarction. Nonspecific mild subcortical and periventricular white matter hypodensity, most in keeping with chronic small vessel ischemic change. Cerebral volume is age appropriate. No ventriculomegaly. Vascular: No acute abnormality. Skull: No evidence of calvarial fracture. Sinuses/Orbits: Mucoperiosteal thickening throughout paranasal sinuses, most prominent in the left maxillary sinus. No fluid levels. Other: Mild scattered gas in the right deep maxillary soft tissues is presumably iatrogenic related to peripheral IV placement. The mastoid air cells are unopacified. IMPRESSION: 1.  No evidence of acute intracranial abnormality. 2. Mild chronic small vessel ischemic changes in the cerebral white matter. 3. Chronic appearing paranasal sinusitis. Electronically Signed   By: Ilona Sorrel M.D.   On: 01/07/2018 21:30   Dg Chest Port 1 View  Result Date:  01/07/2018 CLINICAL DATA:  Cough and dyspnea. Patient fell earlier this evening. Third fall in 3 weeks. EXAM: PORTABLE CHEST 1 VIEW COMPARISON:  12/22/2017 FINDINGS: Heart is top-normal in size. There is aortic atherosclerosis at the arch without aneurysm. The patient is status post median sternotomy and post CABG change. Left atrial appendage clip is also redemonstrated. Pulmonary vascular congestion is noted without acute pneumonic consolidation, effusion or pneumothorax. No acute osseous abnormality. IMPRESSION: Status post CABG with borderline cardiomegaly. Aortic atherosclerosis. Mild pulmonary vascular congestion. Electronically Signed   By: Ashley Royalty M.D.   On: 01/07/2018 19:58    Orson Eva, DO  [facility] Pager [phone]  If 7PM-7AM, please contact night-coverage [email or web address] Password [email or web address] 01/08/2018, 7:28 AM   LOS: 1 day

## 2018-01-08 NOTE — ED Notes (Signed)
Return call from Dr. Olevia Bowens reported last b/p, bp okay at this time with pt HR and size, hold 0.5mg  Ativan for now-ok to give later but only while pt on bi-pap

## 2018-01-08 NOTE — ED Notes (Signed)
Pt removed from bi-pap .  Placed on [place] oxygen at 5 liters.  Pt tolerating well.

## 2018-01-08 NOTE — ED Notes (Signed)
Pt up to [facility] without difficulty.  sats stayed upper 90'2 while getting up.  No SOB.

## 2018-01-09 ENCOUNTER — Inpatient Hospital Stay (HOSPITAL_COMMUNITY): Payer: Medicare Other

## 2018-01-09 DIAGNOSIS — I482 Chronic atrial fibrillation: Secondary | ICD-10-CM

## 2018-01-09 LAB — COMPREHENSIVE METABOLIC PANEL
ALBUMIN: 3 g/dL — AB (ref 3.5–5.0)
ALT: 171 U/L — AB (ref 14–54)
AST: 112 U/L — AB (ref 15–41)
Alkaline Phosphatase: 55 U/L (ref 38–126)
Anion gap: 10 (ref 5–15)
BILIRUBIN TOTAL: 0.8 mg/dL (ref 0.3–1.2)
BUN: 24 mg/dL — AB (ref 6–20)
CO2: 25 mmol/L (ref 22–32)
CREATININE: 0.76 mg/dL (ref 0.44–1.00)
Calcium: 8.7 mg/dL — ABNORMAL LOW (ref 8.9–10.3)
Chloride: 103 mmol/L (ref 101–111)
GFR calc Af Amer: >60 mL/min (ref >60)
GLUCOSE: 291 mg/dL — AB (ref 65–99)
Potassium: 4.3 mmol/L (ref 3.5–5.1)
Sodium: 138 mmol/L (ref 135–145)
TOTAL PROTEIN: 6 g/dL — AB (ref 6.5–8.1)

## 2018-01-09 LAB — CBC
HCT: 28.6 % — ABNORMAL LOW (ref 36.0–46.0)
Hemoglobin: 9.2 g/dL — ABNORMAL LOW (ref 12.0–15.0)
MCH: 28.2 pg (ref 26.0–34.0)
MCHC: 32.2 g/dL (ref 30.0–36.0)
MCV: 87.7 fL (ref 78.0–100.0)
PLATELETS: 191 10*3/uL (ref 150–400)
RBC: 3.26 MIL/uL — ABNORMAL LOW (ref 3.87–5.11)
RDW: 16.2 % — AB (ref 11.5–15.5)
WBC: 5.1 10*3/uL (ref 4.0–10.5)

## 2018-01-09 LAB — RESPIRATORY PANEL BY PCR
Adenovirus: NOT DETECTED
Bordetella pertussis: NOT DETECTED
CORONAVIRUS 229E-RVPPCR: NOT DETECTED
CORONAVIRUS OC43-RVPPCR: NOT DETECTED
Chlamydophila pneumoniae: NOT DETECTED
Coronavirus HKU1: NOT DETECTED
Coronavirus NL63: NOT DETECTED
INFLUENZA A-RVPPCR: NOT DETECTED
Influenza B: NOT DETECTED
METAPNEUMOVIRUS-RVPPCR: NOT DETECTED
Mycoplasma pneumoniae: NOT DETECTED
PARAINFLUENZA VIRUS 1-RVPPCR: NOT DETECTED
PARAINFLUENZA VIRUS 2-RVPPCR: NOT DETECTED
PARAINFLUENZA VIRUS 3-RVPPCR: NOT DETECTED
PARAINFLUENZA VIRUS 4-RVPPCR: NOT DETECTED
Respiratory Syncytial Virus: DETECTED — AB
Rhinovirus / Enterovirus: NOT DETECTED

## 2018-01-09 LAB — BLOOD GAS, ARTERIAL
ACID-BASE EXCESS: 0.1 mmol/L (ref 0.0–2.0)
Bicarbonate: 23.9 mmol/L (ref 20.0–28.0)
DELIVERY SYSTEMS: POSITIVE
Drawn by: 105551
Expiratory PAP: 6
FIO2: 40
Inspiratory PAP: 14
LHR: 8 {breaths}/min
O2 Saturation: 93.4 %
PH ART: 7.322 — AB (ref 7.350–7.450)
pCO2 arterial: 50.6 mmHg — ABNORMAL HIGH (ref 32.0–48.0)
pO2, Arterial: 80.3 mmHg — ABNORMAL LOW (ref 83.0–108.0)

## 2018-01-09 LAB — URINE CULTURE: CULTURE: NO GROWTH

## 2018-01-09 LAB — HEPATITIS B SURFACE ANTIGEN: Hepatitis B Surface Ag: NEGATIVE

## 2018-01-09 LAB — PROCALCITONIN: Procalcitonin: 0.12 ng/mL

## 2018-01-09 LAB — GLUCOSE, CAPILLARY
GLUCOSE-CAPILLARY: 258 mg/dL — AB (ref 65–99)
GLUCOSE-CAPILLARY: 113 mg/dL — AB (ref 65–99)
Glucose-Capillary: 268 mg/dL — ABNORMAL HIGH (ref 65–99)
Glucose-Capillary: 340 mg/dL — ABNORMAL HIGH (ref 65–99)

## 2018-01-09 LAB — HEPATITIS C ANTIBODY: HCV Ab: <0.1 s/co ratio (ref 0.0–0.9)

## 2018-01-09 MED ORDER — METOPROLOL TARTRATE 25 MG PO TABS
25.0000 mg | ORAL_TABLET | Freq: Once | ORAL | Status: AC
  Administered 2018-01-09: 25 mg via ORAL
  Filled 2018-01-09: qty 1

## 2018-01-09 MED ORDER — APIXABAN 5 MG PO TABS
5.0000 mg | ORAL_TABLET | Freq: Two times a day (BID) | ORAL | Status: DC
  Administered 2018-01-09 – 2018-01-12 (×6): 5 mg via ORAL
  Filled 2018-01-09 (×6): qty 1

## 2018-01-09 MED ORDER — INSULIN ASPART 100 UNIT/ML ~~LOC~~ SOLN
0.0000 [IU] | Freq: Every day | SUBCUTANEOUS | Status: DC
Start: 2018-01-09 — End: 2018-01-09

## 2018-01-09 MED ORDER — FUROSEMIDE 10 MG/ML IJ SOLN
40.0000 mg | Freq: Once | INTRAMUSCULAR | Status: AC
  Administered 2018-01-09: 40 mg via INTRAVENOUS
  Filled 2018-01-09: qty 4

## 2018-01-09 MED ORDER — METOPROLOL TARTRATE 50 MG PO TABS
50.0000 mg | ORAL_TABLET | Freq: Two times a day (BID) | ORAL | Status: DC
  Administered 2018-01-09: 50 mg via ORAL
  Filled 2018-01-09: qty 1

## 2018-01-09 MED ORDER — INSULIN ASPART 100 UNIT/ML ~~LOC~~ SOLN
0.0000 [IU] | Freq: Three times a day (TID) | SUBCUTANEOUS | Status: DC
  Administered 2018-01-09: 11 [IU] via SUBCUTANEOUS
  Administered 2018-01-10: 7 [IU] via SUBCUTANEOUS
  Administered 2018-01-10: 20 [IU] via SUBCUTANEOUS
  Administered 2018-01-11: 7 [IU] via SUBCUTANEOUS
  Administered 2018-01-11: 20 [IU] via SUBCUTANEOUS
  Administered 2018-01-11: 4 [IU] via SUBCUTANEOUS
  Administered 2018-01-12: 15 [IU] via SUBCUTANEOUS
  Administered 2018-01-12: 7 [IU] via SUBCUTANEOUS

## 2018-01-09 MED ORDER — INSULIN ASPART 100 UNIT/ML ~~LOC~~ SOLN
0.0000 [IU] | Freq: Three times a day (TID) | SUBCUTANEOUS | Status: DC
  Administered 2018-01-09: 8 [IU] via SUBCUTANEOUS
  Administered 2018-01-09: 11 [IU] via SUBCUTANEOUS

## 2018-01-09 MED ORDER — INSULIN GLARGINE 100 UNIT/ML ~~LOC~~ SOLN
5.0000 [IU] | Freq: Every day | SUBCUTANEOUS | Status: DC
  Administered 2018-01-09: 5 [IU] via SUBCUTANEOUS
  Filled 2018-01-09 (×2): qty 0.05

## 2018-01-09 MED ORDER — ATORVASTATIN CALCIUM 40 MG PO TABS
40.0000 mg | ORAL_TABLET | Freq: Every day | ORAL | Status: DC
  Administered 2018-01-09 – 2018-01-11 (×3): 40 mg via ORAL
  Filled 2018-01-09 (×3): qty 1

## 2018-01-09 MED ORDER — DILTIAZEM HCL 25 MG/5ML IV SOLN
10.0000 mg | Freq: Once | INTRAVENOUS | Status: AC
  Administered 2018-01-09: 10 mg via INTRAVENOUS
  Filled 2018-01-09: qty 5

## 2018-01-09 MED ORDER — AMOXICILLIN-POT CLAVULANATE 875-125 MG PO TABS
1.0000 | ORAL_TABLET | Freq: Two times a day (BID) | ORAL | Status: AC
  Administered 2018-01-09 – 2018-01-11 (×6): 1 via ORAL
  Filled 2018-01-09 (×6): qty 1

## 2018-01-09 MED ORDER — INSULIN ASPART 100 UNIT/ML ~~LOC~~ SOLN
0.0000 [IU] | Freq: Every day | SUBCUTANEOUS | Status: DC
  Administered 2018-01-10: 2 [IU] via SUBCUTANEOUS

## 2018-01-09 MED ORDER — INFLUENZA VAC SPLIT HIGH-DOSE 0.5 ML IM SUSY
0.5000 mL | PREFILLED_SYRINGE | INTRAMUSCULAR | Status: AC
  Administered 2018-01-10: 0.5 mL via INTRAMUSCULAR
  Filled 2018-01-09 (×2): qty 0.5

## 2018-01-09 MED ORDER — METHYLPREDNISOLONE SODIUM SUCC 125 MG IJ SOLR
60.0000 mg | Freq: Two times a day (BID) | INTRAMUSCULAR | Status: DC
  Administered 2018-01-09 – 2018-01-10 (×3): 60 mg via INTRAVENOUS
  Filled 2018-01-09 (×3): qty 2

## 2018-01-09 MED ORDER — METOPROLOL TARTRATE 25 MG PO TABS
25.0000 mg | ORAL_TABLET | Freq: Two times a day (BID) | ORAL | Status: DC
  Administered 2018-01-09: 25 mg via ORAL
  Filled 2018-01-09: qty 1

## 2018-01-09 MED ORDER — HALOPERIDOL LACTATE 5 MG/ML IJ SOLN
2.0000 mg | Freq: Once | INTRAMUSCULAR | Status: AC
  Administered 2018-01-09: 2 mg via INTRAVENOUS
  Filled 2018-01-09: qty 1

## 2018-01-09 NOTE — Progress Notes (Signed)
PROGRESS NOTE  Mary [name] JSH:[id number] DOB: 22-Nov-1950 DOA: 01/07/2018 PCP: Celene Squibb, MD  Brief History:  67 year old female with a history of atrial fibrillation, diastolic CHF, COPD, diabetes mellitus, hyperlipidemia, hypertension, COPD, coronary artery disease with CABG presenting with altered mental status.  The patient's neighbor went to check up on the patient and found the patient confused and somnolent.  EMS was activated.  Upon arrival, the patient was noted to have oxygen saturation 77% on room air.  The patient was placed on BiPAP.  In the emergency department, the patient was given Narcan with improvement of her mental status.  Initial ABG showed 7.21/66/133/22 on 8L HFNC.  She was noted to be hypotensive with blood pressure 86/50.  The patient was given the appropriate fluid boluses and started on intravenous vancomycin and Zosyn.  Urinalysis was negative for pyuria.  Chest x-ray showed pulmonary vascular congestion without consolidation.  CT of the brain was unremarkable.  On the morning of 01/08/2018, the patient was more alert, but not yet back to baseline.  Notably, the patient was recently admitted to the hospital from 11/15/17 through 11/17/17 for pneumonia and COPD exacerbation.  The patient was placed on BiPAP.  Her respiratory status improved, and she was weaned to nasal cannula.  Assessment/Plan: Acute respiratory failure with hypercarbia and hypoxia -Secondary to COPD exacerbation -Weaned off BiPAP -Pulmonary hygiene -now stable on 3L  COPD exacerbation -Continue Pulmicort -Continue duo nebs -Continue IV steroids -respiratory virus panel  SIRS -Source of infection unclear--likely due to volume depletion in retrospect -Lactic acid peaked 1.4 -Discontinue vancomycin and Zosyn -Start amox/clav -Check procalcitonin--0.49 -Personally reviewed chest x-ray--vascular congestion without frank pulmonary edema; no consolidation -Urinalysis negative for  pyuria  -Blood cultures remain negative  Acute metabolic encephalopathy  -Multifactorial including hypercarbia, AKI, medications including opioids and benzodiazepines, and an infectious process -01/09/2018--back to baseline -pt cannot recall events leading up to hospitalization except frequently falling  AKI -Secondary to volume depletion and hemodynamic derangement -Improving with IV fluids -Baseline creatinine 0.3-0.5 -Presenting creatinine 2.55  Transaminasemia -likely due to sepsis/hypotension -RUQ [place]--neg -hep B surface antigen -hep C antibody  Opioid Abuse -Patient was noted to have opioids on her UDS -The [place] Controlled Substance Reporting System has been queried for this patient--last opioid Rx filled  06/07/17--hydrocodone 5/325,#42 -Unclear if she took leftover opioid or she is getting opioids elsewhere--pt denies getting opioids elsewhere  Chronic diastolic CHF -Judicious IV fluids -Respiratory status remained stable/improving despite fluid boluses for sepsis -Hold Lasix for today in the setting of AKI  -01/15/2016 Echo EF 55-60%, no WMA, mild TR/MR -Daily weights  Permanent Atrial fibrillation -Rate controlled -Continue apixaban -Hold digoxin today in the setting of AKI -Plan to restart digoxin 01/10/2018 -restart metoprolol  Diabetes mellitus type 2 -11/17/2017 hemoglobin A1c 6.3 -Holding metformin, Januvia and glipizide -NovoLog sliding scale--increase to moderate scale -elevated CBGs due to steroids  Coronary artery disease status post CABG -No chest pain presently -Personally reviewed EKG--atrial fibrillation, unchanged RBBB -Continue statin and aspirin -restart metoprolol     Disposition Plan:   Home 3/18 or 3/19 if stable Family Communication:  No Family at bedside--Total time spent 35 minutes.  Greater than 50% spent face to face counseling and coordinating care.   Consultants:  none  Code Status:  FULL   DVT  Prophylaxis:  apixaban   Procedures: As Listed in Progress Note Above  Antibiotics: vanco 3/15>>>3/17 Zosyn 3/15>>>  Subjective: Patient is feeling better today.  She has breathing better.  She denies any fevers, chills, chest pain, nausea, vomiting, diarrhea, abdominal pain.  She still feels a little short of breath but it is improving.  She feels weak.  There is no hematochezia or melena.  Objective: Vitals:   01/09/18 0300 01/09/18 0400 01/09/18 0500 01/09/18 0600  BP: 138/72 109/77 122/65 (!) 143/80  Pulse: 93 (!) 106 84 85  Resp: 20 (!) 25 19 19   Temp:  97.6 F (36.4 C)    TempSrc:  Oral    SpO2: 91% 91% 92% 97%  Weight:   53.5 kg (117 lb 15.1 oz)   Height:        Intake/Output Summary (Last 24 hours) at 01/09/2018 0659 Last data filed at 01/09/2018 0400 Gross per 24 hour  Intake 1306.67 ml  Output -  Net 1306.67 ml   Weight change: 3.251 kg (7 lb 2.7 oz) Exam:   General:  Pt is alert, follows commands appropriately, not in acute distress  HEENT: No icterus, No thrush, No neck mass, [place]/AT  Cardiovascular: IRRR, S1/S2, no rubs, no gallops  Respiratory: Diminished breath sounds.  Bibasilar rales.  Minimal basilar wheeze.  Abdomen: Soft/+BS, non tender, non distended, no guarding  Extremities: No edema, No lymphangitis, No petechiae, No rashes, no synovitis   Data Reviewed: I have personally reviewed following labs and imaging studies Basic Metabolic Panel: Recent Labs  Lab 01/07/18 1909 01/08/18 0600  NA 130* 132*  K 4.8 4.5  CL 92* 99*  CO2 24 23  GLUCOSE 95 83  BUN 47* 39*  CREATININE 2.55* 1.77*  CALCIUM 8.9 7.9*   Liver Function Tests: Recent Labs  Lab 01/07/18 1909 01/08/18 0600  AST 268* 176*  ALT 252* 185*  ALKPHOS 71 58  BILITOT 0.6 0.9  PROT 7.0 5.5*  ALBUMIN 3.7 2.7*   No results for input(s): LIPASE, AMYLASE in the last 168 hours. No results for input(s): AMMONIA in the last 168 hours. Coagulation Profile: No  results for input(s): INR, PROTIME in the last 168 hours. CBC: Recent Labs  Lab 01/07/18 1909 01/08/18 0600 01/09/18 0558  WBC 9.7 7.5 5.1  NEUTROABS 5.9 4.6  --   HGB 10.8* 8.9* 9.2*  HCT 33.9* 28.8* 28.6*  MCV 88.1 90.6 87.7  PLT 215 182 191   Cardiac Enzymes: Recent Labs  Lab 01/07/18 1909 01/08/18 0600  CKTOTAL  --  487*  TROPONINI 0.04* 0.03*   BNP: Invalid input(s): POCBNP CBG: Recent Labs  Lab 01/08/18 0731 01/08/18 1238 01/08/18 1652 01/08/18 2137  GLUCAP 81 210* 368* 341*   HbA1C: No results for input(s): HGBA1C in the last 72 hours. Urine analysis:    Component Value Date/Time   COLORURINE AMBER (A) 01/07/2018 2048   APPEARANCEUR CLOUDY (A) 01/07/2018 2048   LABSPEC 1.014 01/07/2018 2048   PHURINE 5.0 01/07/2018 2048   GLUCOSEU NEGATIVE 01/07/2018 2048   HGBUR NEGATIVE 01/07/2018 2048   BILIRUBINUR NEGATIVE 01/07/2018 2048   KETONESUR NEGATIVE 01/07/2018 2048   PROTEINUR NEGATIVE 01/07/2018 2048   UROBILINOGEN 0.2 02/17/2015 2040   NITRITE NEGATIVE 01/07/2018 2048   LEUKOCYTESUR NEGATIVE 01/07/2018 2048   Sepsis Labs: @LABRCNTIP (procalcitonin:4,lacticidven:4) ) Recent Results (from the past 240 hour(s))  Blood culture (routine x 2)     Status: None (Preliminary result)   Collection Time: 01/07/18  7:30 PM  Result Value Ref Range Status   Specimen Description RIGHT ANTECUBITAL  Final   Special Requests   Final  BOTTLES DRAWN AEROBIC ONLY Blood Culture adequate volume   Culture   Final    NO GROWTH 2 DAYS Performed at [facility], [address], [place], [place] [address]    Report Status PENDING  Incomplete  Blood culture (routine x 2)     Status: None (Preliminary result)   Collection Time: 01/07/18  7:37 PM  Result Value Ref Range Status   Specimen Description BLOOD LEFT FOREARM  Final   Special Requests   Final    BOTTLES DRAWN AEROBIC AND ANAEROBIC Blood Culture adequate volume   Culture   Final    NO GROWTH 2 DAYS Performed at  [facility], [address], [place], [place] [address]    Report Status PENDING  Incomplete  MRSA PCR Screening     Status: None   Collection Time: 01/08/18  4:40 PM  Result Value Ref Range Status   MRSA by PCR NEGATIVE NEGATIVE Final    Comment:        The GeneXpert MRSA Assay (FDA approved for NASAL specimens only), is one component of a comprehensive MRSA colonization surveillance program. It is not intended to diagnose MRSA infection nor to guide or monitor treatment for MRSA infections. Performed at [facility], [address], [place], [place] [address]      Scheduled Meds: . apixaban  2.5 mg Oral BID  . aspirin  81 mg Oral Daily  . budesonide (PULMICORT) nebulizer solution  0.5 mg Nebulization BID  . chlorhexidine  15 mL Mouth Rinse BID  . digoxin  0.125 mg Oral Daily  . feeding supplement (ENSURE ENLIVE)  237 mL Oral BID BM  . insulin aspart  0-9 Units Subcutaneous TID WC  . ipratropium  0.5 mg Nebulization Q6H  . levalbuterol  1.25 mg Nebulization Q6H  . mouth rinse  15 mL Mouth Rinse BID  . methylPREDNISolone (SOLU-MEDROL) injection  60 mg Intravenous Q6H   Continuous Infusions: . sodium chloride 50 mL/hr at 01/09/18 0543  . piperacillin-tazobactam (ZOSYN)  IV 3.375 g (01/09/18 0539)  . vancomycin Stopped (01/08/18 2044)    Procedures/Studies: Dg Chest 2 View  Result Date: 12/22/2017 CLINICAL DATA:  COPD.  Cough.  Recent pneumonia. EXAM: CHEST  2 VIEW COMPARISON:  11/15/2017. FINDINGS: Normal sized heart. The previously demonstrated left mid lung airspace opacity has cleared. Both lungs are currently clear. The lungs remain hyperexpanded with mild diffuse peribronchial thickening. Stable post CABG changes and left atrial clip. Diffuse osteopenia and mild thoracolumbar spine degenerative changes. IMPRESSION: 1. Resolved left lung pneumonia. 2. Stable changes of COPD and chronic bronchitis. Electronically Signed   By: Claudie Revering M.D.   On: 12/22/2017 16:15   Ct  Head Wo Contrast  Result Date: 01/07/2018 CLINICAL DATA:  Altered level of consciousness. Multiple falls. Weakness. EXAM: CT HEAD WITHOUT CONTRAST TECHNIQUE: Contiguous axial images were obtained from the base of the skull through the vertex without intravenous contrast. COMPARISON:  10/25/2013 head CT. FINDINGS: Brain: No evidence of parenchymal hemorrhage or extra-axial fluid collection. No mass lesion, mass effect, or midline shift. No CT evidence of acute infarction. Nonspecific mild subcortical and periventricular white matter hypodensity, most in keeping with chronic small vessel ischemic change. Cerebral volume is age appropriate. No ventriculomegaly. Vascular: No acute abnormality. Skull: No evidence of calvarial fracture. Sinuses/Orbits: Mucoperiosteal thickening throughout paranasal sinuses, most prominent in the left maxillary sinus. No fluid levels. Other: Mild scattered gas in the right deep maxillary soft tissues is presumably iatrogenic related to peripheral IV placement.  The mastoid air cells are unopacified. IMPRESSION: 1.  No evidence of acute intracranial abnormality. 2. Mild chronic small vessel ischemic changes in the cerebral white matter. 3. Chronic appearing paranasal sinusitis. Electronically Signed   By: Ilona Sorrel M.D.   On: 01/07/2018 21:30   Dg Chest Port 1 View  Result Date: 01/07/2018 CLINICAL DATA:  Cough and dyspnea. Patient fell earlier this evening. Third fall in 3 weeks. EXAM: PORTABLE CHEST 1 VIEW COMPARISON:  12/22/2017 FINDINGS: Heart is top-normal in size. There is aortic atherosclerosis at the arch without aneurysm. The patient is status post median sternotomy and post CABG change. Left atrial appendage clip is also redemonstrated. Pulmonary vascular congestion is noted without acute pneumonic consolidation, effusion or pneumothorax. No acute osseous abnormality. IMPRESSION: Status post CABG with borderline cardiomegaly. Aortic atherosclerosis. Mild pulmonary vascular  congestion. Electronically Signed   By: Ashley Royalty M.D.   On: 01/07/2018 19:58   US Abdomen Limited Ruq  Result Date: 01/08/2018 CLINICAL DATA:  Elevated LFTs. EXAM: ULTRASOUND ABDOMEN LIMITED RIGHT UPPER QUADRANT COMPARISON:  CT abdomen pelvis dated December 18, 2014. FINDINGS: Gallbladder: No gallstones or wall thickening visualized. No sonographic Murphy sign noted by sonographer. Common bile duct: Diameter: 7 mm, at the upper limits of normal in size. Liver: No focal lesion identified. Within normal limits in parenchymal echogenicity. Portal vein is patent on color Doppler imaging with normal direction of blood flow towards the liver. Incidental note is made of a small right pleural effusion. IMPRESSION: 1. Small right pleural effusion. Otherwise normal right upper quadrant ultrasound. Electronically Signed   By: Titus Dubin M.D.   On: 01/08/2018 09:13    Orson Eva, DO  [facility] Pager [phone]  If 7PM-7AM, please contact night-coverage [email or web address] Password [email or web address] 01/09/2018, 6:59 AM   LOS: 2 days

## 2018-01-09 NOTE — Progress Notes (Signed)
At shift change pt HR increased to 150's-170's and sustaining. Pt SOB and clammy. Pt stated she need something for her anxiety and that it felt like her heart was racing. MD paged. Received order for Cardizem 10mg  IV. RN also gave patient's PRN dose of Ativan. Breathing treatments administered as well.  HR is now 100-125. O2 increased to 3L [place]. Pt seems like she is in less distress.   Will continue to monitor.  Margaret Pyle, RN

## 2018-01-09 NOTE — Progress Notes (Signed)
Chest x-ray resulted. Received order for 40mg  Lasix IV 1x dose. Pt HR is now 80's-90's at rest. If patient moves around in bed HR increases to 120's. RR 20. O2 sat 100%.  Pt states she is feeling better. Will continue to monitor

## 2018-01-09 NOTE — Progress Notes (Signed)
Pt called out for help exclaiming she could not breathe. HR 140-170's Pt gasping for air trying to get out of bed. Pt sitting straight up in bed. Diaphoretic. RR 40's O2 sat 90 on 3L [place]  MD paged; respiratory paged. Received order for STAT chest xray, ABG, BIPAP, 2mg  of Haldol. This RN at patient's bedside trying to calm her down.   Patient now on BIPAP. RR 35-40; HR 150's

## 2018-01-10 ENCOUNTER — Inpatient Hospital Stay (HOSPITAL_COMMUNITY): Payer: Medicare Other

## 2018-01-10 ENCOUNTER — Other Ambulatory Visit: Payer: Self-pay | Admitting: Pharmacy Technician

## 2018-01-10 DIAGNOSIS — I2510 Atherosclerotic heart disease of native coronary artery without angina pectoris: Secondary | ICD-10-CM

## 2018-01-10 DIAGNOSIS — I361 Nonrheumatic tricuspid (valve) insufficiency: Secondary | ICD-10-CM

## 2018-01-10 DIAGNOSIS — I5033 Acute on chronic diastolic (congestive) heart failure: Secondary | ICD-10-CM

## 2018-01-10 DIAGNOSIS — I351 Nonrheumatic aortic (valve) insufficiency: Secondary | ICD-10-CM

## 2018-01-10 DIAGNOSIS — I340 Nonrheumatic mitral (valve) insufficiency: Secondary | ICD-10-CM

## 2018-01-10 LAB — BASIC METABOLIC PANEL
Anion gap: 11 (ref 5–15)
BUN: 25 mg/dL — ABNORMAL HIGH (ref 6–20)
CO2: 28 mmol/L (ref 22–32)
Calcium: 9 mg/dL (ref 8.9–10.3)
Chloride: 97 mmol/L — ABNORMAL LOW (ref 101–111)
Creatinine, Ser: 0.59 mg/dL (ref 0.44–1.00)
GFR calc Af Amer: >60 mL/min (ref >60)
GLUCOSE: 221 mg/dL — AB (ref 65–99)
Potassium: 4.6 mmol/L (ref 3.5–5.1)
Sodium: 136 mmol/L (ref 135–145)

## 2018-01-10 LAB — CBC
HEMATOCRIT: 29.7 % — AB (ref 36.0–46.0)
Hemoglobin: 9.5 g/dL — ABNORMAL LOW (ref 12.0–15.0)
MCH: 28.6 pg (ref 26.0–34.0)
MCHC: 32 g/dL (ref 30.0–36.0)
MCV: 89.5 fL (ref 78.0–100.0)
PLATELETS: 231 10*3/uL (ref 150–400)
RBC: 3.32 MIL/uL — AB (ref 3.87–5.11)
RDW: 16.8 % — ABNORMAL HIGH (ref 11.5–15.5)
WBC: 15.8 10*3/uL — AB (ref 4.0–10.5)

## 2018-01-10 LAB — GLUCOSE, CAPILLARY
GLUCOSE-CAPILLARY: 218 mg/dL — AB (ref 65–99)
Glucose-Capillary: 359 mg/dL — ABNORMAL HIGH (ref 65–99)
Glucose-Capillary: 84 mg/dL (ref 65–99)
Glucose-Capillary: 236 mg/dL — ABNORMAL HIGH (ref 65–99)

## 2018-01-10 LAB — DIGOXIN LEVEL: Digoxin Level: 0.8 ng/mL (ref 0.8–2.0)

## 2018-01-10 LAB — CK: CK TOTAL: 101 U/L (ref 38–234)

## 2018-01-10 LAB — BRAIN NATRIURETIC PEPTIDE: B NATRIURETIC PEPTIDE 5: 626 pg/mL — AB (ref 0.0–100.0)

## 2018-01-10 LAB — ECHOCARDIOGRAM COMPLETE
HEIGHTINCHES: 67 in
WEIGHTICAEL: 1915.36 [oz_av]

## 2018-01-10 LAB — PROCALCITONIN: Procalcitonin: <0.1 ng/mL

## 2018-01-10 LAB — MAGNESIUM: Magnesium: 1.4 mg/dL — ABNORMAL LOW (ref 1.7–2.4)

## 2018-01-10 MED ORDER — ACETAMINOPHEN 325 MG PO TABS
650.0000 mg | ORAL_TABLET | Freq: Four times a day (QID) | ORAL | Status: DC | PRN
  Administered 2018-01-11: 650 mg via ORAL
  Filled 2018-01-10: qty 2

## 2018-01-10 MED ORDER — INSULIN GLARGINE 100 UNIT/ML ~~LOC~~ SOLN
10.0000 [IU] | Freq: Every day | SUBCUTANEOUS | Status: DC
  Administered 2018-01-10 – 2018-01-12 (×3): 10 [IU] via SUBCUTANEOUS
  Filled 2018-01-10 (×4): qty 0.1

## 2018-01-10 MED ORDER — FUROSEMIDE 40 MG PO TABS
40.0000 mg | ORAL_TABLET | Freq: Every day | ORAL | Status: DC
Start: 2018-01-11 — End: 2018-01-12
  Administered 2018-01-11 – 2018-01-12 (×2): 40 mg via ORAL
  Filled 2018-01-10 (×2): qty 1

## 2018-01-10 MED ORDER — MAGNESIUM SULFATE 2 GM/50ML IV SOLN
2.0000 g | Freq: Once | INTRAVENOUS | Status: AC
  Administered 2018-01-10: 2 g via INTRAVENOUS
  Filled 2018-01-10: qty 50

## 2018-01-10 MED ORDER — ALPRAZOLAM 0.5 MG PO TABS
1.0000 mg | ORAL_TABLET | Freq: Three times a day (TID) | ORAL | Status: DC
  Administered 2018-01-10 – 2018-01-12 (×5): 1 mg via ORAL
  Filled 2018-01-10 (×5): qty 2

## 2018-01-10 MED ORDER — LORAZEPAM 2 MG/ML IJ SOLN: 0.5000 mg | Freq: Once | INTRAMUSCULAR | Status: AC

## 2018-01-10 MED ORDER — METHYLPREDNISOLONE SODIUM SUCC 125 MG IJ SOLR
60.0000 mg | Freq: Four times a day (QID) | INTRAMUSCULAR | Status: DC
  Administered 2018-01-10 – 2018-01-11 (×4): 60 mg via INTRAVENOUS
  Filled 2018-01-10 (×4): qty 2

## 2018-01-10 MED ORDER — IPRATROPIUM BROMIDE 0.02 % IN SOLN: 0.5000 mg | Freq: Once | RESPIRATORY_TRACT | Status: DC

## 2018-01-10 MED ORDER — LEVALBUTEROL HCL 1.25 MG/0.5ML IN NEBU: 1.2500 mg | INHALATION_SOLUTION | Freq: Once | RESPIRATORY_TRACT | Status: DC

## 2018-01-10 MED ORDER — ONDANSETRON HCL 4 MG/2ML IJ SOLN: 4.0000 mg | Freq: Four times a day (QID) | INTRAMUSCULAR | Status: DC | PRN

## 2018-01-10 MED ORDER — FUROSEMIDE 10 MG/ML IJ SOLN
40.0000 mg | Freq: Once | INTRAMUSCULAR | Status: AC
  Administered 2018-01-10: 40 mg via INTRAVENOUS
  Filled 2018-01-10: qty 4

## 2018-01-10 MED ORDER — METOPROLOL TARTRATE 50 MG PO TABS
75.0000 mg | ORAL_TABLET | Freq: Two times a day (BID) | ORAL | Status: DC
  Administered 2018-01-10 – 2018-01-12 (×5): 75 mg via ORAL
  Filled 2018-01-10 (×5): qty 1

## 2018-01-10 NOTE — Progress Notes (Signed)
Inpatient Diabetes Program Recommendations  AACE/ADA: New Consensus Statement on Inpatient Glycemic Control (2015)  Target Ranges:  Prepandial:   less than 140 mg/dL      Peak postprandial:   less than 180 mg/dL (1-2 hours)      Critically ill patients:  140 - 180 mg/dL  Results for Mary [name], Mary [name] (MRN [id number]) as of 01/10/2018 07:09  Ref. Range 01/10/2018 05:03  Glucose Latest Ref Range: 65 - 99 mg/dL 221 (H)   Results for CORNIE, MCCOMBER (MRN [id number]) as of 01/10/2018 07:09  Ref. Range 01/09/2018 07:42 01/09/2018 11:09 01/09/2018 16:28 01/09/2018 21:12  Glucose-Capillary Latest Ref Range: 65 - 99 mg/dL 268 (H) 340 (H) 258 (H) 113 (H)   Results for KAEGAN, STIGLER (MRN [id number]) as of 01/10/2018 07:09  Ref. Range 11/17/2017 05:57  Hemoglobin A1C Latest Ref Range: 4.8 - 5.6 % 6.3 (H)    Review of Glycemic Control  Diabetes history: DM2 Outpatient Diabetes medications: Glipizide XL 2.5 mg daily, Metformin 850 mg BID, Januvia 100 mg daily Current orders for Inpatient glycemic control: Lantus 5 units daily, Novolog 0-20 units TID with meals, Novolog 0-5 units QHS; Solumedrol 60 mg Q12H  Inpatient Diabetes Program Recommendations:  Insulin - Basal: If steroids are continued as ordered, please consider increasing Lantus to 10 units daily. Insulin - Meal Coverage: If steroids are continued as ordered, please consider ordering Novolog 4 units TID with meals for meal coverage.  Thanks, [name], RN, MSN, CDE Diabetes Coordinator Inpatient Diabetes Program [phone] (Team Pager from 8am to 5pm)

## 2018-01-10 NOTE — Progress Notes (Signed)
*  PRELIMINARY RESULTS* Echocardiogram 2D Echocardiogram has been performed.  Mary [name] 01/10/2018, 2:45 PM

## 2018-01-10 NOTE — Patient Outreach (Signed)
[facility]) Care Management  01/10/2018  DARBY FLEEMAN 12-29-1950 [id number]  Unsuccessful patient outreach call to follow up on a [employer] patient assistance application that was mailed last month. A HIPPA compliant voicemail was left for the patient to contact Karrie Meres, Rph if she would like to proceed with the application at a later date. Due to this being the third attempt to contact the patient I am sending a message to [place] for case closure.  Doreene Burke, [place] [phone]

## 2018-01-10 NOTE — Clinical Social Work Note (Signed)
Clinical Social Work Assessment  Patient Details  Name: Mary [name] MRN: [id number] Date of Birth: August 31, 1951  Date of referral:  01/10/18               Reason for consult:  Discharge Planning                Permission sought to share information with:  Facility Art therapist granted to share information::  Yes, Verbal Permission Granted  Name::        Agency::  [facility]  Relationship::     Contact Information:     Housing/Transportation Living arrangements for the past 2 months:  Apartment Source of Information:  Patient Patient Interpreter Needed:  None Criminal Activity/Legal Involvement Pertinent to Current Situation/Hospitalization:    Significant Relationships:  Friend, Other Family Members Lives with:  Self Do you feel safe going back to the place where you live?  Yes Need for family participation in patient care:  Yes (Comment)  Care giving concerns: Pt lives alone and feels like she needs SNF rehab before returning home.   Social Worker assessment / plan: Pt is a 67 year old female referred to CSW for SNF short term rehab placement. Met with pt today to discuss dc planning. Per pt, she lives alone in her apartment and she feels she is not yet strong enough to return home. She would like to go to STR at a SNF upon dc. Discussed options and pt requests referral to [facility]. She does not have a backup choice at this time. Will start referral and follow up.  Employment status:  Retired Nurse, adult PT Recommendations:  [place] / Referral to community resources:     Patient/Family's Response to care: Pt accepting of care.  Patient/Family's Understanding of and Emotional Response to Diagnosis, Current Treatment, and Prognosis: Pt appears to have a good understanding of diagnosis and treatment recommendations. No emotional distress identified.  Emotional Assessment Appearance:  Appears stated  age Attitude/Demeanor/Rapport:  Engaged Affect (typically observed):  Calm, Pleasant Orientation:  Oriented to Self, Oriented to Place, Oriented to  Time, Oriented to Situation Alcohol / Substance use:  Not Applicable Psych involvement (Current and /or in the community):  No (Comment)  Discharge Needs  Concerns to be addressed:  Discharge Planning Concerns Readmission within the last 30 days:  No Current discharge risk:  Lives alone, Physical Impairment Barriers to Discharge:  No Barriers Identified   Mary Flood, LCSW 01/10/2018, 12:11 PM

## 2018-01-10 NOTE — NC FL2 (Signed)
[place] LEVEL OF CARE SCREENING TOOL     IDENTIFICATION  Patient Name: Mary [name] Birthdate: 03-Jan-1951 Sex: female Admission Date (Current Location): 01/07/2018  [facility] and [place] Number:  [employer] and Address:  [place] [address], [place]      Provider Number: [id number]  Attending Physician Name and Address:  Orson Eva, MD  Relative Name and Phone Number:  Leda Roys (grand daughter) [phone]    Current Level of Care: Hospital Recommended Level of Care: [place] Prior Approval Number:    Date Approved/Denied:   PASRR Number: [id number]  Discharge Plan: SNF    Current Diagnoses: Patient Active Problem List   Diagnosis Date Noted  . Acute on chronic diastolic CHF (congestive heart failure) ([place]) 01/10/2018  . Sepsis due to undetermined organism ([place]) 01/08/2018  . AKI (acute kidney injury) ([place]) 01/08/2018  . Acute metabolic encephalopathy 57/84/6962  . Acute respiratory failure with hypoxia and hypercarbia ([place]) 01/08/2018  . Transaminasemia 01/08/2018  . Acute respiratory failure with hypercapnia ([place]) 01/07/2018  . Hyponatremia 01/07/2018  . Anemia 01/07/2018  . Lobar pneumonia ([place]) 11/16/2017  . Chronic diastolic CHF (congestive heart failure) ([place]) 11/16/2017  . Acute respiratory failure with hypoxia ([place]) 11/15/2017  . Malnutrition of moderate degree 02/20/2017  . Acute on chronic respiratory failure with hypoxia ([place]) 02/18/2017  . Hypomagnesemia 02/18/2017  . COPD with acute exacerbation ([place]) 11/04/2016  . Left carotid bruit 09/13/2015  . Noncompliance with medications 03/19/2015  . Gastroenteritis 03/19/2015  . Seasonal allergies 01/25/2015  . Chest pain 05/16/2014  . HTN (hypertension) 05/16/2014  . PSVT (paroxysmal supraventricular tachycardia) ([place]) 05/16/2014  . Acute diastolic CHF (congestive heart failure) ([place]) 05/15/2014  . CAD (coronary artery  disease) 05/15/2014  . RBBB 04/25/2014  . Benign skin lesion 03/07/2014  . 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 Atrial fibrillation with rapid ventricular response ([place]) 01/06/2014  . Leukocytosis 01/06/2014  . Fall at home 09/18/2013  . Hypotension 09/18/2013  . Recurrent falls 09/18/2013  . Tinnitus of left ear 02/19/2013  . Altered mental status 12/06/2012  . Diastolic congestive heart failure ([place]) 09/10/2012  . Atrial fibrillation ([place]) 09/10/2012  . DDD (degenerative disc disease), lumbar 08/26/2012  . Atypical mole 04/14/2012  . Seborrheic keratosis 04/14/2012  . Depression 12/09/2011  . COPD (chronic obstructive pulmonary disease) ([place]) 06/27/2011  . Anxiety 06/27/2011  . Chronic pain 05/27/2011  . Diabetes mellitus ([place]) 05/26/2011  . Hyperlipidemia 05/26/2011    Orientation RESPIRATION BLADDER Height & Weight     Self, Time, Situation, Place  O2 Continent Weight: 119 lb 11.4 oz (54.3 kg) Height:  5\' 7"  (170.2 cm)  BEHAVIORAL SYMPTOMS/MOOD NEUROLOGICAL BOWEL NUTRITION STATUS      Continent Diet  AMBULATORY STATUS COMMUNICATION OF NEEDS Skin   Extensive Assist Verbally Normal                       Personal Care Assistance Level of Assistance  Bathing, Feeding, Dressing Bathing Assistance: Limited assistance Feeding assistance: Independent Dressing Assistance: Limited assistance     Functional Limitations Info  Sight, Hearing, Speech Sight Info: Adequate Hearing Info: Adequate Speech Info: Adequate    SPECIAL CARE FACTORS FREQUENCY  PT (By licensed PT)     PT Frequency: 5 times a week              Contractures Contractures Info: Not present    Additional Factors Info  Code Status, Allergies, Psychotropic Code Status Info: full Allergies Info: adhesive tape, latex, Zocor (Simvastatin high dose) Psychotropic Info: xanax         Current Medications (01/10/2018):  This is the current hospital active medication list Current Facility-Administered  Medications  Medication Dose Route Frequency Provider Last Rate Last Dose  . acetaminophen (TYLENOL) tablet 650 mg  650 mg Oral Q6H PRN Tat, David, MD      . amoxicillin-clavulanate (AUGMENTIN) 875-125 MG per tablet 1 tablet  1 tablet Oral Therisa Doyne, MD   1 tablet at 01/10/18 1021  . apixaban (ELIQUIS) tablet 5 mg  5 mg Oral BID Tat, Shanon Brow, MD   5 mg at 01/10/18 1023  . aspirin chewable tablet 81 mg  81 mg Oral Daily Tat, Shanon Brow, MD   81 mg at 01/10/18 1023  . atorvastatin (LIPITOR) tablet 40 mg  40 mg Oral q1800 Orson Eva, MD   40 mg at 01/09/18 1719  . budesonide (PULMICORT) nebulizer solution 0.5 mg  0.5 mg Nebulization BID Tat, David, MD   0.5 mg at 01/10/18 0828  . chlorhexidine (PERIDEX) 0.12 % solution 15 mL  15 mL Mouth Rinse BID Tat, David, MD   15 mL at 01/10/18 1018  . feeding supplement (ENSURE ENLIVE) (ENSURE ENLIVE) liquid 237 mL  237 mL Oral BID BM Tat, Shanon Brow, MD   237 mL at 01/10/18 1023  . insulin aspart (novoLOG) injection 0-20 Units  0-20 Units Subcutaneous TID WC Orson Eva, MD   20 Units at 01/10/18 1212  . insulin aspart (novoLOG) injection 0-5 Units  0-5 Units Subcutaneous QHS Tat, David, MD      . insulin glargine (LANTUS) injection 10 Units  10 Units Subcutaneous Daily Tat, David, MD   10 Units at 01/10/18 1020  . ipratropium (ATROVENT) nebulizer solution 0.5 mg  0.5 mg Nebulization Q6H Reubin Milan, MD   0.5 mg at 01/10/18 1448  . levalbuterol (XOPENEX) nebulizer solution 0.63 mg  0.63 mg Nebulization Q6H PRN Reubin Milan, MD      . levalbuterol [facility]) nebulizer solution 1.25 mg  1.25 mg Nebulization Q6H Reubin Milan, MD   1.25 mg at 01/10/18 1856  . LORazepam (ATIVAN) injection 0.5 mg  0.5 mg Intravenous Q4H PRN Reubin Milan, MD   0.5 mg at 01/09/18 1933  . MEDLINE mouth rinse  15 mL Mouth Rinse BID Orson Eva, MD   15 mL at 01/09/18 2109  . methylPREDNISolone sodium succinate (SOLU-MEDROL) 125 mg/2 mL injection 60 mg  60 mg  Intravenous Therisa Doyne, MD   60 mg at 01/10/18 1007  . metoprolol tartrate (LOPRESSOR) tablet 75 mg  75 mg Oral BID Orson Eva, MD   75 mg at 01/10/18 1021  . ondansetron (ZOFRAN) injection 4 mg  4 mg Intravenous Q6H PRN Tat, Shanon Brow, MD         Discharge Medications: Please see discharge summary for a list of discharge medications.  Relevant Imaging Results:  Relevant Lab Results:   Additional Information SSN: [id number], [place]

## 2018-01-10 NOTE — Progress Notes (Signed)
Patient back on BiPAP due to increased wob , increased heart rate, and pulmonary edema.

## 2018-01-10 NOTE — Evaluation (Signed)
Physical Therapy Evaluation Patient Details Name: Mary [name] MRN: [id number] DOB: 01-26-1951 Today's Date: 01/10/2018   History of Present Illness  Mary [name] is a 67yo female who [facility] on 3[facility]15 with AMS, pt admitted in acute respiratory failure with lung infection. PMH: CAD, AF, asthma, CHF, lowback pain with radiculopathy, COPD, DM2, PND, HLD, HTN, CAD s/p CABG. Baseline includes mostl yhousehold distance AMB with A/E, but she can tolerate walking thegrocery store slowly with acart.   Clinical Impression  Pt admitted with above diagnosis. Pt currently with functional limitations due to the deficits listed below (see "PT Problem List"). Upon entry, the patient is received semirecumbent in bed, no family/caregiver present. Pt recently finished breathing treatment, agreeable to mobilize to chair to eat breakfast. Pt received on 3LPM O2 c SpO2: 88%, during AMB to chair: additional AMB not pursed d/t HR increase to 120s bpm. All functional mobility performed at supervision to [employer] assist, but patient is markedly weaker than baseline, concerning as patient has significant falls history PTA. Empirically, the patient demonstrates increased risk of recurrent falls AEB gait speed <0.33m/s, forward reach <5", and multiple LOB demonstrated throughout session. Pt has very limited support at home, almost exclusively from an old friend who also has health problems and mobility limitations. High frequency, long duration skilled PT services available in STR setting would provide the best option for quick return to PLOF prior to eventual return to home. Pt will benefit from skilled PT intervention to increase independence and safety with basic mobility in preparation for discharge to the venue listed below.       Follow Up Recommendations SNF;Supervision for mobility/OOB(no support at home; 3-4 falls at home inpast 3 months. )    Equipment Recommendations  None recommended by PT    Recommendations for  Other Services       Precautions / Restrictions Restrictions Weight Bearing Restrictions: No      Mobility  Bed Mobility Overal bed mobility: Needs Assistance Bed Mobility: Supine to Sit     Supine to sit: Supervision;Modified independent (Device/Increase time)        Transfers Overall transfer level: Needs assistance Equipment used: Quad cane Transfers: Sit to/from Stand Sit to Stand: Min guard            Ambulation/Gait Ambulation/Gait assistance: Min guard Ambulation Distance (Feet): 8 Feet(bed to chair only, with SpO2 with HR elevation to 122bpm. ) Assistive device: Quad cane       General Gait Details: SpO2: 88% on 3L/min   Stairs            Wheelchair Mobility    Modified Rankin (Stroke Patients Only)       Balance Overall balance assessment: Mild deficits observed, not formally tested                                           Pertinent Vitals/Pain Pain Assessment: No/denies pain    Home Living Family/patient expects to be discharged to:: Private residence Living Arrangements: Alone Available Help at Discharge: Friend(s) Type of Home: Apartment Home Access: Stairs to enter Entrance Stairs-Rails: Can reach both;Left;Right Entrance Stairs-Number of Steps: 2 Home Layout: One level Home Equipment: Walker - 2 wheels;Wheelchair - manual;Grab bars - tub/shower;Cane - quad;Cane - single point;Bedside commode;Shower seat      Prior Function Level of Independence: Needs assistance;Independent with assistive device(s)   Gait /  Transfers Assistance Needed: QC or RW in home; can amb grocfery with cart support   ADL's / Homemaking Assistance Needed: ndependent in ADL; Rosaland Lao (old friend) give s transport sometimes        Hand Dominance   Dominant Hand: Right    Extremity/Trunk Assessment   Upper Extremity Assessment Upper Extremity Assessment: Generalized weakness    Lower Extremity Assessment Lower  Extremity Assessment: Generalized weakness       Communication   Communication: No difficulties  Cognition                                              General Comments      Exercises     Assessment/Plan    PT Assessment Patient needs continued PT services  PT Problem List Cardiopulmonary status limiting activity;Decreased mobility;Decreased activity tolerance;Decreased balance;Decreased strength       PT Treatment Interventions Balance training;Gait training;Functional mobility training;Therapeutic activities;Therapeutic exercise;Patient/family education    PT Goals (Current goals can be found in the Care Plan section)  Acute Rehab PT Goals Patient Stated Goal: regain safety adn independence at home  PT Goal Formulation: With patient Time For Goal Achievement: 01/24/18 Potential to Achieve Goals: Good    Frequency Min 3X/week   Barriers to discharge Inaccessible home environment;Decreased caregiver support      Co-evaluation               AM-PAC PT "6 Clicks" Daily Activity  Outcome Measure Difficulty turning over in bed (including adjusting bedclothes, sheets and blankets)?: A Little Difficulty moving from lying on back to sitting on the side of the bed? : A Little Difficulty sitting down on and standing up from a chair with arms (e.g., wheelchair, bedside commode, etc,.)?: A Lot Help needed moving to and from a bed to chair (including a wheelchair)?: A Little Help needed walking in hospital room?: A Little Help needed climbing 3-5 steps with a railing? : A Lot 6 Click Score: 16    End of Session Equipment Utilized During Treatment: Oxygen Activity Tolerance: Treatment limited secondary to medical complications (Comment);Patient limited by fatigue(tachycardia adn spO2: 88% on 3L/min) Patient left: in chair;with nursing/sitter in room;with call bell/phone within reach Nurse Communication: Mobility status PT Visit Diagnosis: Unsteadiness  on feet (R26.81);Repeated falls (R29.6);Difficulty in walking, not elsewhere classified (R26.2)    Time: 0277-4128 PT Time Calculation (min) (ACUTE ONLY): 18 min   Charges:   PT Evaluation $PT Eval Moderate Complexity: 1 Mod     PT G Codes:        1:13 PM, Jan 27, 2018 Etta Grandchild, PT, DPT Physical Therapist at [facility] [phone] (office)     Buccola,Allan C 01-27-18, 1:07 PM

## 2018-01-10 NOTE — Care Management (Signed)
Received referral for home health, equipment. Patient has been recommended for SNF and is agreeable. CM will clear consult.

## 2018-01-10 NOTE — Progress Notes (Addendum)
Initial Nutrition Assessment  DOCUMENTATION CODES:      INTERVENTION:  Heart Healthy/ CHO modified   Continue Ensure Enlive BID - and follow CBG's  NUTRITION DIAGNOSIS:   Increased nutrient needs related to acute illness, chronic illness(SIRS, COPD exacerbation) as evidenced by estimated needs.  GOAL:   Provide needs based on ASPEN/SCCM guidelines  MONITOR:   PO intake, Supplement acceptance, Labs, Weight trends  REASON FOR ASSESSMENT:   Malnutrition Screening Tool    ASSESSMENT:  The patient is a 67 yo female with hx of CHF, COPD, DM, COPD, CAD. Requiring BiPAP after presenting with altered mental status. Acute respiratory failure, SIRS. CT findings- not acute. This is her second admission the past 6 months. PT is recommending SNF and pt is agreeable to go for therapy.  Discussed pt nutriiton progression with nurse. Meal intake documented at 50% and nurse reports good acceptance of Ensure Enlive.   Her weight hx shows fairly stable the past 6 months between 117-122 lb. Last year in April she weighed 123 lb.    Labs: Hyperglycemia (likely steroid med contributing).   BMP Latest Ref Rng & Units 01/10/2018 01/09/2018 01/08/2018  Glucose 65 - 99 mg/dL 221(H) 291(H) 83  BUN 6 - 20 mg/dL 25(H) 24(H) 39(H)  Creatinine 0.44 - 1.00 mg/dL 0.59 0.76 1.77(H)  Sodium 135 - 145 mmol/L 136 138 132(L)  Potassium 3.5 - 5.1 mmol/L 4.6 4.3 4.5  Chloride 101 - 111 mmol/L 97(L) 103 99(L)  CO2 22 - 32 mmol/L 28 25 23   Calcium 8.9 - 10.3 mg/dL 9.0 8.7(L) 7.9(L)    Scheduled Meds: . ALPRAZolam  1 mg Oral TID  . amoxicillin-clavulanate  1 tablet Oral Q12H  . apixaban  5 mg Oral BID  . aspirin  81 mg Oral Daily  . atorvastatin  40 mg Oral q1800  . budesonide (PULMICORT) nebulizer solution  0.5 mg Nebulization BID  . chlorhexidine  15 mL Mouth Rinse BID  . feeding supplement (ENSURE ENLIVE)  237 mL Oral BID BM  . [START ON 01/11/2018] furosemide  40 mg Oral Daily  . insulin aspart  0-20  Units Subcutaneous TID WC  . insulin aspart  0-5 Units Subcutaneous QHS  . insulin glargine  10 Units Subcutaneous Daily  . ipratropium  0.5 mg Nebulization Q6H  . ipratropium  0.5 mg Nebulization Once  . levalbuterol  1.25 mg Nebulization Q6H  . levalbuterol  1.25 mg Nebulization Once  . LORazepam  0.5 mg Intravenous Once  . mouth rinse  15 mL Mouth Rinse BID  . methylPREDNISolone (SOLU-MEDROL) injection  60 mg Intravenous Q6H  . metoprolol tartrate  75 mg Oral BID   Continuous Infusions: PRN Meds:.acetaminophen, levalbuterol, LORazepam, ondansetron (ZOFRAN) IV   NUTRITION - FOCUSED PHYSICAL EXAM:  Deferred -pt unable to participate at this time  Diet Order:  Diet heart healthy/carb modified Room service appropriate? Yes; Fluid consistency: Thin  EDUCATION NEEDS:   Not appropriate for education at this time  Skin:  Skin Assessment: Reviewed RN Assessment  Last BM:  3/17  Height:   Ht Readings from Last 1 Encounters:  01/08/18 5\' 7"  (1.702 m)    Weight:   Wt Readings from Last 1 Encounters:  01/10/18 119 lb 11.4 oz (54.3 kg)    Ideal Body Weight:  61.3 kg  BMI:  Body mass index is 18.75 kg/m.  Estimated Nutritional Needs:   Kcal:  1400-1600   Protein:  75-85 gr  Fluid:  1.5-1.6   Colman Cater MS,RD,CSG,LDN  Office: [phone] Pager: [phone]

## 2018-01-10 NOTE — Progress Notes (Addendum)
PROGRESS NOTE  Mary [name]:[id number] DOB: 12/28/1950 DOA: 01/07/2018 PCP: Celene Squibb, MD  Brief History: 67 year old female with a history of atrial fibrillation, diastolic CHF, COPD, diabetes mellitus, hyperlipidemia, hypertension, COPD, coronary artery disease with CABG presenting with altered mental status. The patient's neighbor went to check up on the patient and found the patient confused and somnolent. EMS was activated. Upon arrival, the patient was noted to have oxygen saturation 77% on room air. The patient was placed on BiPAP. In the emergency department, the patient was given Narcan with improvement of her mental status. Initial ABG showed 7.21/66/133/22 on 8L HFNC.She was noted to be hypotensive with blood pressure 86/50. The patient was given the appropriate fluid boluses and started on intravenous vancomycin and Zosyn. Urinalysis was negative for pyuria. Chest x-ray showed pulmonary vascular congestion without consolidation. CT of the brain was unremarkable. On the morning of 01/08/2018, the patient was more alert, but not yet back to baseline. Notably, the patient was recently admitted to the hospital from 11/15/17 through 11/17/17 for pneumonia and COPD exacerbation.  The patient was placed on BiPAP.  Her respiratory status improved, and she was weaned to nasal cannula.  Assessment/Plan: Acute respiratory failure with hypercarbia and hypoxia -Secondary to COPD exacerbation -Weaned off BiPAP -Pulmonary hygiene -01/09/18 pm--developed respiratory distress--placed back on BiPaP  COPD exacerbation -Continue Pulmicort -Continue duo nebs -Continue IV steroids -respiratory virus panel--+RSV -partly incited by RSV  SIRS -Source of infection unclear--likely due to volume depletion in retrospect -Lactic acid peaked 1.4 -Discontinue vancomycin and Zosyn -Continue amox/clav -Check procalcitonin--0.49>>>less than 0.10 -Personally reviewed 3/17 chest  x-ray--increased intersitial markings -Urinalysis negative for pyuria  -Blood cultures remain negative  Acute metabolic encephalopathy -Multifactorial including hypercarbia, AKI, medications including opioids and benzodiazepines, and an infectious process -01/09/2018--back to baseline -pt cannot recall events leading up to hospitalization except frequently falling  AKI -Secondary to volume depletion and hemodynamic derangement -Improved with IV fluids -Baseline creatinine 0.3-0.5 -Presenting creatinine 2.55  Acute on Chronic diastolic CHF -saline lock IVF -Respiratory status remained stable/improving despite fluid boluses for sepsis initially -received lasix IV x1 pm 3/17-->1700 cc -01/15/2016 Echo EF 55-60%, no WMA, mild TR/MR -Daily weights -repeat echo  Transaminasemia -likely due to sepsis/hypotension -RUQ US--neg -hep B surface antigen--neg -hep C antibody--neg  Opioid Abuse -Patient was noted to have opioids on her UDS -The [place] Controlled Substance Reporting System has been queried for this patient--last opioid Rx filled 06/07/17--hydrocodone 5/325,#42 -Unclear if she took leftover opioid or she is getting opioids elsewhere--pt denies getting opioids elsewhere  Permanent Atrial fibrillation -Rate controlled -Continue apixaban -increase metoprolol to 75mg  bid  Diabetes mellitus type 2 -11/17/2017 hemoglobin A1c 6.3 -Holding metformin, Januvia and glipizide -NovoLog sliding scale--increase to moderate scale -elevated CBGs due to steroids  Coronary artery disease status post CABG -No chest pain presently -Personally reviewed EKG--atrial fibrillation, unchanged RBBB -Continue statin and aspirin -restarted metoprolol  Hypomagnesemia -replete   Disposition Plan: Home 3/19 or 3/20 if stable Family Communication:NoFamily at bedside   Consultants:none  Code Status: FULL   DVT Prophylaxis:apixaban   Procedures: As  Listed in Progress Note Above  Antibiotics: vanco 3/15>>>3/17 Zosyn 3/15>>>3/17 Amox/clav 3/17>>>    Subjective: Patient received Lasix overnight for respiratory distress.  She is breathing better this morning.  She denies any distress at this time.  She has not had any chest pain, nausea, vomiting, diarrhea, abdominal pain, dysuria, hematuria.  Objective: Vitals:  01/10/18 0320 01/10/18 0400 01/10/18 0500 01/10/18 0600  BP:  (!) 156/90 (!) 142/66 (!) 152/77  Pulse: 90 85 88 81  Resp: (!) 21 19 (!) 21 18  Temp:  (!) 97.3 F (36.3 C)    TempSrc:  Axillary    SpO2: 99% 97% 97% 100%  Weight:   54.3 kg (119 lb 11.4 oz)   Height:        Intake/Output Summary (Last 24 hours) at 01/10/2018 0751 Last data filed at 01/10/2018 0500 Gross per 24 hour  Intake 2160 ml  Output 1700 ml  Net 460 ml   Weight change: 0.7 kg (1 lb 8.7 oz) Exam:   General:  Pt is alert, follows commands appropriately, not in acute distress  HEENT: No icterus, No thrush, No neck mass, [place]/AT  Cardiovascular: IRRR, S1/S2, no rubs, no gallops  Respiratory: Bilateral rales.  Bibasilar crackles.  Bibasilar expiratory wheeze.  Good air movement.  Abdomen: Soft/+BS, non tender, non distended, no guarding  Extremities: No edema, No lymphangitis, No petechiae, No rashes, no synovitis   Data Reviewed: I have personally reviewed following labs and imaging studies Basic Metabolic Panel: Recent Labs  Lab 01/07/18 1909 01/08/18 0600 01/09/18 0558 01/10/18 0503  NA 130* 132* 138 136  K 4.8 4.5 4.3 4.6  CL 92* 99* 103 97*  CO2 24 23 25 28   GLUCOSE 95 83 291* 221*  BUN 47* 39* 24* 25*  CREATININE 2.55* 1.77* 0.76 0.59  CALCIUM 8.9 7.9* 8.7* 9.0  MG  --   --   --  1.4*   Liver Function Tests: Recent Labs  Lab 01/07/18 1909 01/08/18 0600 01/09/18 0558  AST 268* 176* 112*  ALT 252* 185* 171*  ALKPHOS 71 58 55  BILITOT 0.6 0.9 0.8  PROT 7.0 5.5* 6.0*  ALBUMIN 3.7 2.7* 3.0*   No results for  input(s): LIPASE, AMYLASE in the last 168 hours. No results for input(s): AMMONIA in the last 168 hours. Coagulation Profile: No results for input(s): INR, PROTIME in the last 168 hours. CBC: Recent Labs  Lab 01/07/18 1909 01/08/18 0600 01/09/18 0558 01/10/18 0503  WBC 9.7 7.5 5.1 15.8*  NEUTROABS 5.9 4.6  --   --   HGB 10.8* 8.9* 9.2* 9.5*  HCT 33.9* 28.8* 28.6* 29.7*  MCV 88.1 90.6 87.7 89.5  PLT 215 182 191 231   Cardiac Enzymes: Recent Labs  Lab 01/07/18 1909 01/08/18 0600 01/10/18 0503  CKTOTAL  --  487* 101  TROPONINI 0.04* 0.03*  --    BNP: Invalid input(s): POCBNP CBG: Recent Labs  Lab 01/08/18 2137 01/09/18 0742 01/09/18 1109 01/09/18 1628 01/09/18 2112  GLUCAP 341* 268* 340* 258* 113*   HbA1C: No results for input(s): HGBA1C in the last 72 hours. Urine analysis:    Component Value Date/Time   COLORURINE AMBER (A) 01/07/2018 2048   APPEARANCEUR CLOUDY (A) 01/07/2018 2048   LABSPEC 1.014 01/07/2018 2048   PHURINE 5.0 01/07/2018 2048   GLUCOSEU NEGATIVE 01/07/2018 2048   HGBUR NEGATIVE 01/07/2018 2048   BILIRUBINUR NEGATIVE 01/07/2018 2048   KETONESUR NEGATIVE 01/07/2018 2048   PROTEINUR NEGATIVE 01/07/2018 2048   UROBILINOGEN 0.2 02/17/2015 2040   NITRITE NEGATIVE 01/07/2018 2048   LEUKOCYTESUR NEGATIVE 01/07/2018 2048   Sepsis Labs: @LABRCNTIP (procalcitonin:4,lacticidven:4) ) Recent Results (from the past 240 hour(s))  Blood culture (routine x 2)     Status: None (Preliminary result)   Collection Time: 01/07/18  7:30 PM  Result Value Ref Range Status   Specimen  Description RIGHT ANTECUBITAL  Final   Special Requests   Final    BOTTLES DRAWN AEROBIC ONLY Blood Culture adequate volume   Culture   Final    NO GROWTH 2 DAYS Performed at [facility], [address] [place], [place] [address]    Report Status PENDING  Incomplete  Blood culture (routine x 2)     Status: None (Preliminary result)   Collection Time: 01/07/18  7:37 PM  Result  Value Ref Range Status   Specimen Description BLOOD LEFT FOREARM  Final   Special Requests   Final    BOTTLES DRAWN AEROBIC AND ANAEROBIC Blood Culture adequate volume   Culture   Final    NO GROWTH 2 DAYS Performed at [facility], [address], [place], [place] [address]    Report Status PENDING  Incomplete  Urine Culture     Status: None   Collection Time: 01/07/18  8:48 PM  Result Value Ref Range Status   Specimen Description   Final    URINE, CATHETERIZED Performed at [facility], [address], [place], [place] [address]    Special Requests   Final    NONE Performed at [facility], [address], [place], [place] [address]    Culture   Final    NO GROWTH Performed at [facility], [place] [address], [place], [place] [address]    Report Status 01/09/2018 FINAL  Final  Respiratory Panel by PCR     Status: Abnormal   Collection Time: 01/08/18  4:35 PM  Result Value Ref Range Status   Adenovirus NOT DETECTED NOT DETECTED Final   Coronavirus 229E NOT DETECTED NOT DETECTED Final   Coronavirus HKU1 NOT DETECTED NOT DETECTED Final   Coronavirus NL63 NOT DETECTED NOT DETECTED Final   Coronavirus OC43 NOT DETECTED NOT DETECTED Final   Metapneumovirus NOT DETECTED NOT DETECTED Final   Rhinovirus / Enterovirus NOT DETECTED NOT DETECTED Final   Influenza A NOT DETECTED NOT DETECTED Final   Influenza B NOT DETECTED NOT DETECTED Final   Parainfluenza Virus 1 NOT DETECTED NOT DETECTED Final   Parainfluenza Virus 2 NOT DETECTED NOT DETECTED Final   Parainfluenza Virus 3 NOT DETECTED NOT DETECTED Final   Parainfluenza Virus 4 NOT DETECTED NOT DETECTED Final   Respiratory Syncytial Virus DETECTED (A) NOT DETECTED Final    Comment: CRITICAL RESULT CALLED TO, READ BACK BY AND VERIFIED WITH: LHILTON,RN @1020  01/09/18 BY LHOWARD    Bordetella pertussis NOT DETECTED NOT DETECTED Final   Chlamydophila pneumoniae NOT DETECTED NOT DETECTED Final   Mycoplasma pneumoniae NOT DETECTED  NOT DETECTED Final    Comment: Performed at [facility], [place] [address], [place], [place] [address]  MRSA PCR Screening     Status: None   Collection Time: 01/08/18  4:40 PM  Result Value Ref Range Status   MRSA by PCR NEGATIVE NEGATIVE Final    Comment:        The GeneXpert MRSA Assay (FDA approved for NASAL specimens only), is one component of a comprehensive MRSA colonization surveillance program. It is not intended to diagnose MRSA infection nor to guide or monitor treatment for MRSA infections. Performed at [facility], [address], [place], [place] [address]      Scheduled Meds: . amoxicillin-clavulanate  1 tablet Oral Q12H  . apixaban  5 mg Oral BID  . aspirin  81 mg Oral Daily  . atorvastatin  40 mg Oral q1800  . budesonide (PULMICORT)  nebulizer solution  0.5 mg Nebulization BID  . chlorhexidine  15 mL Mouth Rinse BID  . feeding supplement (ENSURE ENLIVE)  237 mL Oral BID BM  . Influenza vac split quadrivalent PF  0.5 mL Intramuscular Tomorrow-1000  . insulin aspart  0-20 Units Subcutaneous TID WC  . insulin aspart  0-5 Units Subcutaneous QHS  . insulin glargine  5 Units Subcutaneous Daily  . ipratropium  0.5 mg Nebulization Q6H  . levalbuterol  1.25 mg Nebulization Q6H  . mouth rinse  15 mL Mouth Rinse BID  . methylPREDNISolone (SOLU-MEDROL) injection  60 mg Intravenous Q12H  . metoprolol tartrate  50 mg Oral BID   Continuous Infusions: . magnesium sulfate 1 - 4 g bolus IVPB      Procedures/Studies: Dg Chest 2 View  Result Date: 12/22/2017 CLINICAL DATA:  COPD.  Cough.  Recent pneumonia. EXAM: CHEST  2 VIEW COMPARISON:  11/15/2017. FINDINGS: Normal sized heart. The previously demonstrated left mid lung airspace opacity has cleared. Both lungs are currently clear. The lungs remain hyperexpanded with mild diffuse peribronchial thickening. Stable post CABG changes and left atrial clip. Diffuse osteopenia and mild thoracolumbar spine degenerative changes.  IMPRESSION: 1. Resolved left lung pneumonia. 2. Stable changes of COPD and chronic bronchitis. Electronically Signed   By: Claudie Revering M.D.   On: 12/22/2017 16:15   Ct Head Wo Contrast  Result Date: 01/07/2018 CLINICAL DATA:  Altered level of consciousness. Multiple falls. Weakness. EXAM: CT HEAD WITHOUT CONTRAST TECHNIQUE: Contiguous axial images were obtained from the base of the skull through the vertex without intravenous contrast. COMPARISON:  10/25/2013 head CT. FINDINGS: Brain: No evidence of parenchymal hemorrhage or extra-axial fluid collection. No mass lesion, mass effect, or midline shift. No CT evidence of acute infarction. Nonspecific mild subcortical and periventricular white matter hypodensity, most in keeping with chronic small vessel ischemic change. Cerebral volume is age appropriate. No ventriculomegaly. Vascular: No acute abnormality. Skull: No evidence of calvarial fracture. Sinuses/Orbits: Mucoperiosteal thickening throughout paranasal sinuses, most prominent in the left maxillary sinus. No fluid levels. Other: Mild scattered gas in the right deep maxillary soft tissues is presumably iatrogenic related to peripheral IV placement. The mastoid air cells are unopacified. IMPRESSION: 1.  No evidence of acute intracranial abnormality. 2. Mild chronic small vessel ischemic changes in the cerebral white matter. 3. Chronic appearing paranasal sinusitis. Electronically Signed   By: Ilona Sorrel M.D.   On: 01/07/2018 21:30   Dg Chest Port 1 View  Result Date: 01/09/2018 CLINICAL DATA:  Shortness of breath. EXAM: PORTABLE CHEST 1 VIEW COMPARISON:  Radiographs 2 days prior 01/07/2018 FINDINGS: Post median sternotomy and left atrial clipping. Mild cardiomegaly is similar to prior exam. Progressive peribronchial cuffing and interstitial thickening from prior exam suspicious for pulmonary edema. No confluent airspace disease. Suspect small pleural effusions. No pneumothorax. IMPRESSION: 1. Increasing  interstitial and peribronchial thickening suspicious for pulmonary edema. Suspect small pleural effusions. 2. Unchanged cardiomegaly.  Aortic atherosclerosis. Electronically Signed   By: Jeb Levering M.D.   On: 01/09/2018 23:13   Dg Chest Port 1 View  Result Date: 01/07/2018 CLINICAL DATA:  Cough and dyspnea. Patient fell earlier this evening. Third fall in 3 weeks. EXAM: PORTABLE CHEST 1 VIEW COMPARISON:  12/22/2017 FINDINGS: Heart is top-normal in size. There is aortic atherosclerosis at the arch without aneurysm. The patient is status post median sternotomy and post CABG change. Left atrial appendage clip is also redemonstrated. Pulmonary vascular congestion is noted without acute pneumonic consolidation, effusion or  pneumothorax. No acute osseous abnormality. IMPRESSION: Status post CABG with borderline cardiomegaly. Aortic atherosclerosis. Mild pulmonary vascular congestion. Electronically Signed   By: Ashley Royalty M.D.   On: 01/07/2018 19:58   US Abdomen Limited Ruq  Result Date: 01/08/2018 CLINICAL DATA:  Elevated LFTs. EXAM: ULTRASOUND ABDOMEN LIMITED RIGHT UPPER QUADRANT COMPARISON:  CT abdomen pelvis dated December 18, 2014. FINDINGS: Gallbladder: No gallstones or wall thickening visualized. No sonographic Murphy sign noted by sonographer. Common bile duct: Diameter: 7 mm, at the upper limits of normal in size. Liver: No focal lesion identified. Within normal limits in parenchymal echogenicity. Portal vein is patent on color Doppler imaging with normal direction of blood flow towards the liver. Incidental note is made of a small right pleural effusion. IMPRESSION: 1. Small right pleural effusion. Otherwise normal right upper quadrant ultrasound. Electronically Signed   By: Titus Dubin M.D.   On: 01/08/2018 09:13    Orson Eva, DO  [facility] Pager [phone]  If 7PM-7AM, please contact night-coverage [email or web address] Password [email or web address] 01/10/2018, 7:51 AM   LOS: 3 days

## 2018-01-11 DIAGNOSIS — I10 Essential (primary) hypertension: Secondary | ICD-10-CM

## 2018-01-11 LAB — MAGNESIUM: MAGNESIUM: 1.8 mg/dL (ref 1.7–2.4)

## 2018-01-11 LAB — GLUCOSE, CAPILLARY
GLUCOSE-CAPILLARY: 351 mg/dL — AB (ref 65–99)
GLUCOSE-CAPILLARY: 151 mg/dL — AB (ref 65–99)
Glucose-Capillary: 220 mg/dL — ABNORMAL HIGH (ref 65–99)
Glucose-Capillary: 167 mg/dL — ABNORMAL HIGH (ref 65–99)
Glucose-Capillary: 168 mg/dL — ABNORMAL HIGH (ref 65–99)

## 2018-01-11 LAB — COMPREHENSIVE METABOLIC PANEL
ALBUMIN: 3.1 g/dL — AB (ref 3.5–5.0)
ALT: 135 U/L — AB (ref 14–54)
ANION GAP: 9 (ref 5–15)
AST: 51 U/L — ABNORMAL HIGH (ref 15–41)
Alkaline Phosphatase: 60 U/L (ref 38–126)
BUN: 26 mg/dL — ABNORMAL HIGH (ref 6–20)
CHLORIDE: 95 mmol/L — AB (ref 101–111)
CO2: 31 mmol/L (ref 22–32)
CREATININE: 0.59 mg/dL (ref 0.44–1.00)
Calcium: 9.1 mg/dL (ref 8.9–10.3)
GFR calc non Af Amer: >60 mL/min (ref >60)
GLUCOSE: 208 mg/dL — AB (ref 65–99)
Potassium: 4.6 mmol/L (ref 3.5–5.1)
SODIUM: 135 mmol/L (ref 135–145)
Total Bilirubin: 0.8 mg/dL (ref 0.3–1.2)
Total Protein: 6.1 g/dL — ABNORMAL LOW (ref 6.5–8.1)

## 2018-01-11 MED ORDER — INSULIN ASPART 100 UNIT/ML ~~LOC~~ SOLN
4.0000 [IU] | Freq: Three times a day (TID) | SUBCUTANEOUS | Status: DC
  Administered 2018-01-11 – 2018-01-12 (×4): 4 [IU] via SUBCUTANEOUS

## 2018-01-11 MED ORDER — MAGNESIUM SULFATE 2 GM/50ML IV SOLN
2.0000 g | Freq: Once | INTRAVENOUS | Status: AC
  Administered 2018-01-11: 2 g via INTRAVENOUS
  Filled 2018-01-11: qty 50

## 2018-01-11 MED ORDER — DILTIAZEM HCL 30 MG PO TABS
30.0000 mg | ORAL_TABLET | Freq: Four times a day (QID) | ORAL | Status: DC
  Administered 2018-01-11 – 2018-01-12 (×5): 30 mg via ORAL
  Filled 2018-01-11 (×5): qty 1

## 2018-01-11 MED ORDER — METHYLPREDNISOLONE SODIUM SUCC 125 MG IJ SOLR
60.0000 mg | Freq: Two times a day (BID) | INTRAMUSCULAR | Status: DC
Start: 2018-01-11 — End: 2018-01-12
  Administered 2018-01-11 – 2018-01-12 (×2): 60 mg via INTRAVENOUS
  Filled 2018-01-11 (×2): qty 2

## 2018-01-11 NOTE — Progress Notes (Signed)
Inpatient Diabetes Program Recommendations  AACE/ADA: New Consensus Statement on Inpatient Glycemic Control (2015)  Target Ranges:  Prepandial:   less than 140 mg/dL      Peak postprandial:   less than 180 mg/dL (1-2 hours)      Critically ill patients:  140 - 180 mg/dL  Results for ELONA, YINGER (MRN [id number]) as of 01/11/2018 07:51  Ref. Range 01/11/2018 03:53  Glucose Latest Ref Range: 65 - 99 mg/dL 208 (H)   Results for MARYCARMEN, HAGEY (MRN [id number]) as of 01/11/2018 07:51  Ref. Range 01/10/2018 08:15 01/10/2018 11:50 01/10/2018 17:13 01/10/2018 21:34  Glucose-Capillary Latest Ref Range: 65 - 99 mg/dL 218 (H) 359 (H) 84 236 (H)   Review of Glycemic Control  Diabetes history: DM2 Outpatient Diabetes medications: Glipizide XL 2.5 mg daily, Metformin 850 mg BID, Januvia 100 mg daily Current orders for Inpatient glycemic control: Lantus 10 units daily, Novolog 0-20 units TID with meals, Novolog 0-5 units QHS; Solumedrol 60 mg Q8H  Inpatient Diabetes Program Recommendations:  Insulin - Meal Coverage: If steroids are continued as ordered, please consider ordering Novolog 4 units TID with meals for meal coverage.  Thanks, [name], RN, MSN, CDE Diabetes Coordinator Inpatient Diabetes Program [phone] (Team Pager from 8am to 5pm)

## 2018-01-11 NOTE — Progress Notes (Signed)
PROGRESS NOTE  Mary [name]:[id number] DOB: [birth year] DOA: 01/07/2018 PCP: Celene Squibb, MD   Brief History: 67 year old female with a history of atrial fibrillation, diastolic CHF, COPD, diabetes mellitus, hyperlipidemia, hypertension, COPD, coronary artery disease with CABG presenting with altered mental status. The patient's neighbor went to check up on the patient and found the patient confused and somnolent. EMS was activated. Upon arrival, the patient was noted to have oxygen saturation 77% on room air. The patient was placed on BiPAP. In the emergency department, the patient was given Narcan with improvement of her mental status. Initial ABG showed 7.21/66/133/22 on 8L HFNC.She was noted to be hypotensive with blood pressure 86/50. The patient was given the appropriate fluid boluses and started on intravenous vancomycin and Zosyn. Urinalysis was negative for pyuria. Chest x-ray showed pulmonary vascular congestion without consolidation. CT of the brain was unremarkable. On the morning of 01/08/2018, the patient was more alert, but not yet back to baseline. Notably, the patient was recently admitted to the hospital from 11/15/17 through 11/17/17 for pneumonia and COPD exacerbation.The patient was placed on BiPAP. Her respiratory status improved, and she was weaned to nasal cannula.  Assessment/Plan: Acute respiratory failure with hypercarbia and hypoxia -Secondary to COPD exacerbation -Weanedoff BiPAP initially -Pulmonary hygiene -01/09/18 pm--developed respiratory distress--placed back on BiPaP short time -01/10/18 pm--required BiPAP overnight  COPD exacerbation -ContinuePulmicort -Continue duo nebs -ContinueIV steroids -respiratory virus panel--+RSV -incited by RSV  SIRS -likely due to volume depletion in retrospect -Lactic acid peaked 1.4 -Discontinue vancomycin and Zosyn -disContinueamox/clav -Check procalcitonin--0.49>>>less than  0.10 -Personally reviewed 3/17 chest x-ray--increased intersitial markings -Urinalysis negative for pyuria -Blood cultures remain negative  PermanentAtrial fibrillation -developed RVR -increased metoprolol to 75mg  bid -rate remains elevated 100-120 -Continue apixaban -start diltiazem  Acute metabolic encephalopathy -Multifactorial including hypercarbia, AKI, medications including opioids and benzodiazepines, and an infectious process -01/09/2018--back to baseline -pt cannot recall events leading up to hospitalization except frequently falling  AKI -Secondary to volume depletion and hemodynamic derangement -Improved with IV fluids -Baseline creatinine 0.3-0.5 -Presenting creatinine 2.55  Acute on Chronic diastolic CHF -due to fluid resuscitation -saline lock IVF -received lasix 40 mg IV x2 -01/15/2016 Echo EF 55-60%, no WMA, mild TR/MR -Daily weights stable -continue lasix 40 mg po daily -appears clinically euvolemic -01/10/18 echo--EF 50-55%, PAS P 67, moderate TR, mild decreased RV function  Transaminasemia -likely due to sepsis/hypotension -RUQ US--neg -hep B surface antigen--neg -hep C antibody--neg  Opioid Abuse -Patient was noted to have opioids on her UDS -The [place] Controlled Substance Reporting System has been queried for this patient--last opioid Rx filled 06/07/17--hydrocodone 5/325,#42 -Unclear if she took leftover opioid or she is getting opioids elsewhere--pt denies getting opioids elsewhere  Diabetes mellitus type 2 -11/17/2017 hemoglobin A1c 6.3 -Holding metformin, Januvia and glipizide -NovoLog sliding scale--increase to moderate scale -elevated CBGs due to steroids -add novolog 4 units with meals  Coronary artery disease status post CABG -No chest pain presently -Personally reviewed EKG--atrial fibrillation, unchanged RBBB -Continue statin and aspirin -restarted metoprolol  Hypomagnesemia -replete   Disposition Plan:  Home3/20 or 3/21 if stable Family Communication:NoFamily at bedside   Consultants:none  Code Status: FULL   DVT Prophylaxis:apixaban   Procedures: As Listed in Progress Note Above  Antibiotics: vanco 3/15>>>3/17 Zosyn 3/15>>>3/17 Amox/clav 3/17>>>3/19   Subjective: Patient is breathing better than yesterday, but she remains short of breath with minimal exertion.  She denies any nausea, vomiting, diarrhea, abdominal pain,  dysuria, hematuria.  Objective: Vitals:   01/11/18 0800 01/11/18 0943 01/11/18 0948 01/11/18 1016  BP: (!) 170/98   116/88  Pulse: 84   (!) 128  Resp: 20     Temp: 98.1 F (36.7 C)     TempSrc: Oral     SpO2: 99% 97% 100%   Weight:      Height:        Intake/Output Summary (Last 24 hours) at 01/11/2018 1159 Last data filed at 01/11/2018 0500 Gross per 24 hour  Intake 200 ml  Output 2375 ml  Net -2175 ml   Weight change: -1.2 kg (-10.3 oz) Exam:   General:  Pt is alert, follows commands appropriately, not in acute distress  HEENT: No icterus, No thrush, No neck mass, [place]/AT  Cardiovascular: IRRR, S1/S2, no rubs, no gallops  Respiratory: Bibasilar crackles.  Scattered bilateral expiratory wheeze  Abdomen: Soft/+BS, non tender, non distended, no guarding  Extremities: No edema, No lymphangitis, No petechiae, No rashes, no synovitis   Data Reviewed: I have personally reviewed following labs and imaging studies Basic Metabolic Panel: Recent Labs  Lab 01/07/18 1909 01/08/18 0600 01/09/18 0558 01/10/18 0503 01/11/18 0353  NA 130* 132* 138 136 135  K 4.8 4.5 4.3 4.6 4.6  CL 92* 99* 103 97* 95*  CO2 24 23 25 28 31   GLUCOSE 95 83 291* 221* 208*  BUN 47* 39* 24* 25* 26*  CREATININE 2.55* 1.77* 0.76 0.59 0.59  CALCIUM 8.9 7.9* 8.7* 9.0 9.1  MG  --   --   --  1.4* 1.8   Liver Function Tests: Recent Labs  Lab 01/07/18 1909 01/08/18 0600 01/09/18 0558 01/11/18 0353  AST 268* 176* 112* 51*  ALT 252* 185* 171*  135*  ALKPHOS 71 58 55 60  BILITOT 0.6 0.9 0.8 0.8  PROT 7.0 5.5* 6.0* 6.1*  ALBUMIN 3.7 2.7* 3.0* 3.1*   No results for input(s): LIPASE, AMYLASE in the last 168 hours. No results for input(s): AMMONIA in the last 168 hours. Coagulation Profile: No results for input(s): INR, PROTIME in the last 168 hours. CBC: Recent Labs  Lab 01/07/18 1909 01/08/18 0600 01/09/18 0558 01/10/18 0503  WBC 9.7 7.5 5.1 15.8*  NEUTROABS 5.9 4.6  --   --   HGB 10.8* 8.9* 9.2* 9.5*  HCT 33.9* 28.8* 28.6* 29.7*  MCV 88.1 90.6 87.7 89.5  PLT 215 182 191 231   Cardiac Enzymes: Recent Labs  Lab 01/07/18 1909 01/08/18 0600 01/10/18 0503  CKTOTAL  --  487* 101  TROPONINI 0.04* 0.03*  --    BNP: Invalid input(s): POCBNP CBG: Recent Labs  Lab 01/10/18 1150 01/10/18 1713 01/10/18 2134 01/11/18 0817 01/11/18 1112  GLUCAP 359* 84 236* 220* 351*   HbA1C: No results for input(s): HGBA1C in the last 72 hours. Urine analysis:    Component Value Date/Time   COLORURINE AMBER (A) 01/07/2018 2048   APPEARANCEUR CLOUDY (A) 01/07/2018 2048   LABSPEC 1.014 01/07/2018 2048   PHURINE 5.0 01/07/2018 2048   GLUCOSEU NEGATIVE 01/07/2018 2048   HGBUR NEGATIVE 01/07/2018 2048   BILIRUBINUR NEGATIVE 01/07/2018 2048   KETONESUR NEGATIVE 01/07/2018 2048   PROTEINUR NEGATIVE 01/07/2018 2048   UROBILINOGEN 0.2 02/17/2015 2040   NITRITE NEGATIVE 01/07/2018 2048   LEUKOCYTESUR NEGATIVE 01/07/2018 2048   Sepsis Labs: @LABRCNTIP (procalcitonin:4,lacticidven:4) ) Recent Results (from the past 240 hour(s))  Blood culture (routine x 2)     Status: None (Preliminary result)   Collection Time: 01/07/18  7:30 PM  Result Value Ref Range Status   Specimen Description RIGHT ANTECUBITAL  Final   Special Requests   Final    BOTTLES DRAWN AEROBIC ONLY Blood Culture adequate volume   Culture   Final    NO GROWTH 4 DAYS Performed at [facility], [address], [place], [place] [address]    Report Status PENDING   Incomplete  Blood culture (routine x 2)     Status: None (Preliminary result)   Collection Time: 01/07/18  7:37 PM  Result Value Ref Range Status   Specimen Description BLOOD LEFT FOREARM  Final   Special Requests   Final    BOTTLES DRAWN AEROBIC AND ANAEROBIC Blood Culture adequate volume   Culture   Final    NO GROWTH 4 DAYS Performed at [facility], [address], [place], [place] [address]    Report Status PENDING  Incomplete  Urine Culture     Status: None   Collection Time: 01/07/18  8:48 PM  Result Value Ref Range Status   Specimen Description   Final    URINE, CATHETERIZED Performed at [facility], [address], [place], [place] [address]    Special Requests   Final    NONE Performed at [facility], [address] [place], [place] [address]    Culture   Final    NO GROWTH Performed at [facility], [place] [address], [place], [place] [address]    Report Status 01/09/2018 FINAL  Final  Respiratory Panel by PCR     Status: Abnormal   Collection Time: 01/08/18  4:35 PM  Result Value Ref Range Status   Adenovirus NOT DETECTED NOT DETECTED Final   Coronavirus 229E NOT DETECTED NOT DETECTED Final   Coronavirus HKU1 NOT DETECTED NOT DETECTED Final   Coronavirus NL63 NOT DETECTED NOT DETECTED Final   Coronavirus OC43 NOT DETECTED NOT DETECTED Final   Metapneumovirus NOT DETECTED NOT DETECTED Final   Rhinovirus / Enterovirus NOT DETECTED NOT DETECTED Final   Influenza A NOT DETECTED NOT DETECTED Final   Influenza B NOT DETECTED NOT DETECTED Final   Parainfluenza Virus 1 NOT DETECTED NOT DETECTED Final   Parainfluenza Virus 2 NOT DETECTED NOT DETECTED Final   Parainfluenza Virus 3 NOT DETECTED NOT DETECTED Final   Parainfluenza Virus 4 NOT DETECTED NOT DETECTED Final   Respiratory Syncytial Virus DETECTED (A) NOT DETECTED Final    Comment: CRITICAL RESULT CALLED TO, READ BACK BY AND VERIFIED WITH: LHILTON,RN @1020  01/09/18 BY LHOWARD    Bordetella pertussis NOT  DETECTED NOT DETECTED Final   Chlamydophila pneumoniae NOT DETECTED NOT DETECTED Final   Mycoplasma pneumoniae NOT DETECTED NOT DETECTED Final    Comment: Performed at [facility], [place] [address] [place], [place] [address]  MRSA PCR Screening     Status: None   Collection Time: 01/08/18  4:40 PM  Result Value Ref Range Status   MRSA by PCR NEGATIVE NEGATIVE Final    Comment:        The GeneXpert MRSA Assay (FDA approved for NASAL specimens only), is one component of a comprehensive MRSA colonization surveillance program. It is not intended to diagnose MRSA infection nor to guide or monitor treatment for MRSA infections. Performed at [facility], [address] [place], [place] [address]      Scheduled Meds: . ALPRAZolam  1 mg Oral TID  . amoxicillin-clavulanate  1 tablet Oral Q12H  . apixaban  5 mg Oral BID  . aspirin  81 mg Oral Daily  . atorvastatin  40 mg Oral q1800  . budesonide (PULMICORT) nebulizer solution  0.5 mg Nebulization BID  . chlorhexidine  15 mL Mouth Rinse BID  . diltiazem  30 mg Oral Q6H  . feeding supplement (ENSURE ENLIVE)  237 mL Oral BID BM  . furosemide  40 mg Oral Daily  . insulin aspart  0-20 Units Subcutaneous TID WC  . insulin aspart  0-5 Units Subcutaneous QHS  . insulin glargine  10 Units Subcutaneous Daily  . ipratropium  0.5 mg Nebulization Q6H  . levalbuterol  1.25 mg Nebulization Q6H  . mouth rinse  15 mL Mouth Rinse BID  . methylPREDNISolone (SOLU-MEDROL) injection  60 mg Intravenous Q6H  . metoprolol tartrate  75 mg Oral BID   Continuous Infusions: . magnesium sulfate 1 - 4 g bolus IVPB      Procedures/Studies: Dg Chest 2 View  Result Date: 12/22/2017 CLINICAL DATA:  COPD.  Cough.  Recent pneumonia. EXAM: CHEST  2 VIEW COMPARISON:  11/15/2017. FINDINGS: Normal sized heart. The previously demonstrated left mid lung airspace opacity has cleared. Both lungs are currently clear. The lungs remain hyperexpanded with mild diffuse  peribronchial thickening. Stable post CABG changes and left atrial clip. Diffuse osteopenia and mild thoracolumbar spine degenerative changes. IMPRESSION: 1. Resolved left lung pneumonia. 2. Stable changes of COPD and chronic bronchitis. Electronically Signed   By: Claudie Revering M.D.   On: 12/22/2017 16:15   Ct Head Wo Contrast  Result Date: 01/07/2018 CLINICAL DATA:  Altered level of consciousness. Multiple falls. Weakness. EXAM: CT HEAD WITHOUT CONTRAST TECHNIQUE: Contiguous axial images were obtained from the base of the skull through the vertex without intravenous contrast. COMPARISON:  10/25/2013 head CT. FINDINGS: Brain: No evidence of parenchymal hemorrhage or extra-axial fluid collection. No mass lesion, mass effect, or midline shift. No CT evidence of acute infarction. Nonspecific mild subcortical and periventricular white matter hypodensity, most in keeping with chronic small vessel ischemic change. Cerebral volume is age appropriate. No ventriculomegaly. Vascular: No acute abnormality. Skull: No evidence of calvarial fracture. Sinuses/Orbits: Mucoperiosteal thickening throughout paranasal sinuses, most prominent in the left maxillary sinus. No fluid levels. Other: Mild scattered gas in the right deep maxillary soft tissues is presumably iatrogenic related to peripheral IV placement. The mastoid air cells are unopacified. IMPRESSION: 1.  No evidence of acute intracranial abnormality. 2. Mild chronic small vessel ischemic changes in the cerebral white matter. 3. Chronic appearing paranasal sinusitis. Electronically Signed   By: Ilona Sorrel M.D.   On: 01/07/2018 21:30   Dg Chest Port 1 View  Result Date: 01/09/2018 CLINICAL DATA:  Shortness of breath. EXAM: PORTABLE CHEST 1 VIEW COMPARISON:  Radiographs 2 days prior 01/07/2018 FINDINGS: Post median sternotomy and left atrial clipping. Mild cardiomegaly is similar to prior exam. Progressive peribronchial cuffing and interstitial thickening from prior  exam suspicious for pulmonary edema. No confluent airspace disease. Suspect small pleural effusions. No pneumothorax. IMPRESSION: 1. Increasing interstitial and peribronchial thickening suspicious for pulmonary edema. Suspect small pleural effusions. 2. Unchanged cardiomegaly.  Aortic atherosclerosis. Electronically Signed   By: Jeb Levering M.D.   On: 01/09/2018 23:13   Dg Chest Port 1 View  Result Date: 01/07/2018 CLINICAL DATA:  Cough and dyspnea. Patient fell earlier this evening. Third fall in 3 weeks. EXAM: PORTABLE CHEST 1 VIEW COMPARISON:  12/22/2017 FINDINGS: Heart is top-normal in size. There is aortic atherosclerosis at the arch without aneurysm. The patient is status post median sternotomy and post  CABG change. Left atrial appendage clip is also redemonstrated. Pulmonary vascular congestion is noted without acute pneumonic consolidation, effusion or pneumothorax. No acute osseous abnormality. IMPRESSION: Status post CABG with borderline cardiomegaly. Aortic atherosclerosis. Mild pulmonary vascular congestion. Electronically Signed   By: Ashley Royalty M.D.   On: 01/07/2018 19:58   US Abdomen Limited Ruq  Result Date: 01/08/2018 CLINICAL DATA:  Elevated LFTs. EXAM: ULTRASOUND ABDOMEN LIMITED RIGHT UPPER QUADRANT COMPARISON:  CT abdomen pelvis dated December 18, 2014. FINDINGS: Gallbladder: No gallstones or wall thickening visualized. No sonographic Murphy sign noted by sonographer. Common bile duct: Diameter: 7 mm, at the upper limits of normal in size. Liver: No focal lesion identified. Within normal limits in parenchymal echogenicity. Portal vein is patent on color Doppler imaging with normal direction of blood flow towards the liver. Incidental note is made of a small right pleural effusion. IMPRESSION: 1. Small right pleural effusion. Otherwise normal right upper quadrant ultrasound. Electronically Signed   By: Titus Dubin M.D.   On: 01/08/2018 09:13    Orson Eva, DO  [facility] Pager [phone]  If 7PM-7AM, please contact night-coverage [email or web address] Password [email or web address] 01/11/2018, 11:59 AM   LOS: 4 days

## 2018-01-12 DIAGNOSIS — Z23 Encounter for immunization: Secondary | ICD-10-CM | POA: Diagnosis not present

## 2018-01-12 DIAGNOSIS — G9341 Metabolic encephalopathy: Secondary | ICD-10-CM

## 2018-01-12 DIAGNOSIS — J9602 Acute respiratory failure with hypercapnia: Secondary | ICD-10-CM

## 2018-01-12 LAB — GLUCOSE, CAPILLARY
GLUCOSE-CAPILLARY: 364 mg/dL — AB (ref 65–99)
Glucose-Capillary: 249 mg/dL — ABNORMAL HIGH (ref 65–99)

## 2018-01-12 LAB — COMPREHENSIVE METABOLIC PANEL
ALBUMIN: 3.1 g/dL — AB (ref 3.5–5.0)
ALK PHOS: 60 U/L (ref 38–126)
ALT: 103 U/L — ABNORMAL HIGH (ref 14–54)
AST: 30 U/L (ref 15–41)
Anion gap: 11 (ref 5–15)
BUN: 29 mg/dL — AB (ref 6–20)
CALCIUM: 9 mg/dL (ref 8.9–10.3)
CO2: 30 mmol/L (ref 22–32)
CREATININE: 0.54 mg/dL (ref 0.44–1.00)
Chloride: 91 mmol/L — ABNORMAL LOW (ref 101–111)
GFR calc Af Amer: >60 mL/min (ref >60)
GFR calc non Af Amer: >60 mL/min (ref >60)
GLUCOSE: 223 mg/dL — AB (ref 65–99)
Potassium: 4 mmol/L (ref 3.5–5.1)
Sodium: 132 mmol/L — ABNORMAL LOW (ref 135–145)
Total Bilirubin: 0.8 mg/dL (ref 0.3–1.2)
Total Protein: 6 g/dL — ABNORMAL LOW (ref 6.5–8.1)

## 2018-01-12 LAB — CULTURE, BLOOD (ROUTINE X 2)
CULTURE: NO GROWTH
Culture: NO GROWTH
Special Requests: ADEQUATE
Special Requests: ADEQUATE

## 2018-01-12 LAB — MAGNESIUM: Magnesium: 1.8 mg/dL (ref 1.7–2.4)

## 2018-01-12 MED ORDER — DILTIAZEM HCL ER COATED BEADS 120 MG PO CP24: 120.0000 mg | ORAL_CAPSULE | Freq: Every day | ORAL | 2 refills | Status: DC

## 2018-01-12 NOTE — Progress Notes (Signed)
SATURATION QUALIFICATIONS: (This note is used to comply with regulatory documentation for home oxygen)  Patient Saturations on Room Air at Rest = 94-97  Patient Saturations on Room Air while Ambulating = [phone]  Patient Saturations on 2 Liters of oxygen while Ambulating = n/a  Please briefly explain why patient needs home oxygen: Not needed at this time. Pt completed walk study without oxygen. No s/s of labored breathing or SOB.  Walked around nursing unit twice.   Girtrude Enslin Rica Mote, RN 12:59 PM

## 2018-01-12 NOTE — Progress Notes (Signed)
Patient has had an excellent night. Slept well. BIPAP placed per order over night. This AM Nasal Cannula was titrated down to 2L. No pain. Foley removed.  Margaret Pyle, RN

## 2018-01-12 NOTE — Care Management Important Message (Signed)
Important Message  Patient Details  Name: Mary [name] MRN: [id number] Date of Birth: February 10, 1951   Medicare Important Message Given:  Yes    Panzy Bubeck, Chauncey Reading, RN 01/12/2018, 10:11 AM

## 2018-01-12 NOTE — Care Management Note (Addendum)
Case Management Note  Patient Details  Name: Mary [name] MRN: [id number] Date of Birth: April 05, 1951     Expected Discharge Date:  01/12/18               Expected Discharge Plan:  [place]  In-House Referral:  Clinical Social Work  Discharge planning Services  CM Consult  Post Acute Care Choice:    Choice offered to:     DME Arranged:    DME Agency:     HH Arranged:  RN, PT, Social Work [employer] Agency:  [place]  Status of Service:  Completed, signed off  If discussed at [employer], dates discussed:    Additional Comments: Patient discharging home today. Declines SNF. Would like [facility] for home health. [name] of [facility] notified, will obtain orders via Epic. Patient does not qualify for home oxygen- sats remained 94-97% per nursing during walk test. Patient will also be referred for [place] calls. Discussed with patient, she is agreeable to plan. Patient reports she has all necessary DME at home (RW, WC, BSC, cane).  Kassidy Dockendorf, Chauncey Reading, RN 01/12/2018, 1:06 PM

## 2018-01-12 NOTE — Clinical Social Work Note (Signed)
LCSW following. Met with pt again this AM regarding dc planning. Per pt, she prefers to dc home instead of SNF. Pt states that her sister is going to come stay with her for a couple weeks. Pt is agreeable to [facility]. She requests [facility]. Pt aware that she may need O2 at dc as well and she states she has had Home O2 in the past.   Updated RN CM, Lidia Collum, who will arrange for pt's dc needs. LCSW will sign off.

## 2018-01-12 NOTE — Discharge Summary (Signed)
Physician Discharge Summary  Mary [name] NOM:[id number] DOB: 04-16-1951 DOA: 01/07/2018  PCP: Celene Squibb, MD  Admit date: 01/07/2018 Discharge date: 01/12/2018  Time spent: 45 minutes  Recommendations for Outpatient Follow-up:  -Will be discharged home today. -Recommendation was made for SNF, however patient refused.  Home health services have been arranged.  Discharge Diagnoses:  Principal Problem:   Acute respiratory failure with hypercapnia (HCC) Active Problems:   Hyperlipidemia   Anxiety   Depression   Diastolic congestive heart failure (HCC)   Atrial fibrillation (HCC)   Altered mental status   Hypotension   RBBB   CAD (coronary artery disease)   HTN (hypertension)   Noncompliance with medications   COPD with acute exacerbation (HCC)   Chronic diastolic CHF (congestive heart failure) (HCC)   Hyponatremia   Anemia   Sepsis due to undetermined organism ([place])   AKI (acute kidney injury) ([place])   Acute metabolic encephalopathy   Acute respiratory failure with hypoxia and hypercarbia (HCC)   Transaminasemia   Acute on chronic diastolic CHF (congestive heart failure) ([place])   Discharge Condition: Stable and improved  Filed Weights   01/10/18 0500 01/11/18 0600 01/12/18 0500  Weight: 54.3 kg (119 lb 11.4 oz) 53.1 kg (117 lb 1 oz) 53.5 kg (117 lb 15.1 oz)    History of present illness:  As per Dr. Olevia Bowens on 3/15 Mary [name] is a 67 y.o. female with medical history significant of anxiety, osteoarthritis, atrial fibrillation, bronchial asthma, diastolic CHF, chronic back pain/degenerative disc disease with radicular symptoms, COPD, type 2 diabetes, diabetic peripheral neuropathy, CAD/CABG, hyperlipidemia, hypertension who is brought to the emergency department due to altered mental status.  She is unable to provide further history at this time, but per Dr. Sabra Heck and neighbor found her to be altered and incoherent earlier today.  She apparently has stated to the  neighbor that she does not want to do it anymore, which is concerning given her history of depression.   ED Course: Initial vital signs temperature 36.6C (97.9 F), pulse 77, respirations 24, blood pressure 86/50 and O2 sat 77% on room air.  The patient received supplemental oxygen, albuterol 5 mg via neb, BiPAP ventilation, 1750 mL normal saline bolus naloxone 0.4 mg IV x1, Zosyn and vancomycin per pharmacy.  Her workup shows an ABG with a pH of 7.21, PCO2 of 16.9 and PO2 of 133 mmHg on a LPM of oxygen via [place].  All other ABG values were normal.    Urine analysis did not show signs of infection, hemoglobinuria proteinuria or ketonuria.  Urine drug screen was positive for opiates and benzodiazepines (I do not see an opiate prescription and her medication list).    Blood cultures x2 were drawn.  Her CBC shows a white count of 9.7 with a normal differential, hemoglobin 10.8 g/dL and platelets 215.  Troponin was 0.04 and digoxin level was 1.5 ng/mL.  BNP was 269 pg/mL.  Lactic acid x2 1.4 then 0.57 mmol/L.  Sodium was 130, potassium 4.8, chloride 92 and CO2 24 mmol/L.  Glucose 95, BUN 47 and creatinine 255 mg/dL (most recent 12/0 0.35 mg/dL on 11/18/2007).AST was 268 and ALT 252 U/L.  These values have been normal or low in the past.  The rest of the LFTs are within normal limits.  Salicylate, ethanol and acetaminophen levels were normal. EKG was atrial fibrillation with a ventricular rate of 82 bpm showing a right bundle branch block, which is not significantly changed from  previous.  Imaging: Her chest radiograph shows cardiomegaly and mild vascular congestion. CT head without contrast did not show any acute intracranial pathology.  However he shows chronic small vessel ischemic changes.  Chronic appearing paranasal sinusitis.  Please see images and full radiology reports for further details.    Hospital Course:   Acute hypoxemic and hypercarbic respiratory failure -Due to COPD with acute  exacerbation -Did require BiPAP initially but has been weaned off to nasal cannula oxygen. -Please see below for details.  COPD with acute exacerbation -Precipitating factor was RSV infection that was demonstrated on her respiratory virus panel. -Will be discharged on a prednisone taper and nebulizers. -Home oxygen has been arranged by case management.  Sirs -Likely noninfectious. -Was initially on broad-spectrum antibiotics which have subsequently been discontinued. -Blood cultures urine analysis and chest x-ray without signs of infection.  Permanent atrial fibrillation -While hospitalized developed rapid ventricular response. -This has abated with increasing her metoprolol to 75 mg twice daily as well as diltiazem 120 mg daily. -She is anticoagulated on apixaban.  Acute metabolic encephalopathy -Resolved at time of discharge. -Believed multifactorial due to hypercarbia, acute renal failure, medications including opioids and benzodiazepines as well as RSV and acute COPD.  Next  Acute renal insufficiency -Due to prerenal azotemia, presenting creatinine was 2.55, has normalized at time of discharge, 0.54.  Hypomagnesemia -Repleted  Type 2 diabetes -Well-controlled, resume oral medications on discharge including metformin, Januvia and glipizide.  Acute on chronic diastolic heart failure -Echo from March 2017 shows an ejection fraction of 55-60% with no wall motion abnormality and mild tricuspid and mitral regurgitation. -She appears clinically euvolemic on discharge.   -Repeat echo this admission is essentially unchanged.   Procedures:  None   Consultations:  None  Discharge Instructions  Discharge Instructions    Diet - low sodium heart healthy   Complete by:  As directed    Increase activity slowly   Complete by:  As directed      Allergies as of 01/12/2018      Reactions   Adhesive [tape] Other (See Comments)   Tears skin off.    Latex Other (See Comments)     In tape, tears skin off.   Zocor [simvastatin - High Dose] Nausea And Vomiting      Medication List    STOP taking these medications   DIGOX 0.125 MG tablet Generic drug:  digoxin   gabapentin 300 MG capsule Commonly known as:  NEURONTIN   PRADAXA 150 MG Caps capsule Generic drug:  dabigatran   sodium chloride 0.65 % nasal spray Commonly known as:  OCEAN     TAKE these medications   acetaminophen 500 MG tablet Commonly known as:  TYLENOL Take 500 mg by mouth every 6 (six) hours as needed for mild pain.   acetylcysteine 20 % nebulizer solution Commonly known as:  MUCOMYST Take 3 mLs by nebulization every 6 (six) hours.   albuterol 108 (90 Base) MCG/ACT inhaler Commonly known as:  PROVENTIL HFA;VENTOLIN HFA Inhale 2 puffs into the lungs every 6 (six) hours as needed for shortness of breath.   ALPRAZolam 1 MG tablet Commonly known as:  XANAX Take 1 mg by mouth 4 (four) times daily.   apixaban 5 MG Tabs tablet Commonly known as:  ELIQUIS Take 1 tablet (5 mg total) by mouth 2 (two) times daily. Take place of Pradaxa   aspirin EC 81 MG tablet Take 81 mg by mouth daily.   atorvastatin 40 MG tablet Commonly  known as:  LIPITOR TAKE ONE TABLET BY MOUTH DAILY.   diltiazem 120 MG 24 hr capsule Commonly known as:  CARDIZEM CD Take 1 capsule (120 mg total) by mouth daily.   DULoxetine 30 MG capsule Commonly known as:  CYMBALTA Take 1 capsule by mouth daily.   fluticasone 50 MCG/ACT nasal spray Commonly known as:  FLONASE Place 2 sprays into both nostrils daily.   furosemide 40 MG tablet Commonly known as:  LASIX TAKE ONE TABLET BY MOUTH ONCE DAILY.   FUSION PLUS PO Take 1 tablet by mouth daily.   glipiZIDE 2.5 MG 24 hr tablet Commonly known as:  GLUCOTROL XL 2.5 mg daily.   JANUVIA 100 MG tablet Generic drug:  sitaGLIPtin TAKE ONE TABLET BY MOUTH DAILY.   levalbuterol 0.63 MG/3ML nebulizer solution Commonly known as:  XOPENEX Take 3 mLs (0.63 mg  total) by nebulization every 6 (six) hours.   lisinopril 2.5 MG tablet Commonly known as:  PRINIVIL,ZESTRIL TAKE ONE TABLET BY MOUTH DAILY.   Magnesium Oxide 250 MG Tabs Take 250 mg by mouth daily.   metFORMIN 850 MG tablet Commonly known as:  GLUCOPHAGE TAKE 1 TABLET BY MOUTH TWICE DAILY WITH MEALS.   metoprolol tartrate 50 MG tablet Commonly known as:  LOPRESSOR TAKE 2 TABLETS BY MOUTH IN THE MORNING AND 1 AND 1/2 TABLETS AT BEDTIME.   montelukast 10 MG tablet Commonly known as:  SINGULAIR Take 1 tablet by mouth daily.   multivitamin capsule Take 1 capsule by mouth daily.   potassium chloride SA 20 MEQ tablet Commonly known as:  K-DUR,KLOR-CON Take 1 tablet (20 mEq total) by mouth daily.   SYMBICORT 160-4.5 MCG/ACT inhaler Generic drug:  budesonide-formoterol INHALE 2 PUFFS INTO THE LUNGS TWICE DAILY. RINSE MOUTH AFTER USE.      Allergies  Allergen Reactions  . Adhesive [Tape] Other (See Comments)    Tears skin off.   . Latex Other (See Comments)    In tape, tears skin off.  . Zocor [Simvastatin - High Dose] Nausea And Vomiting   Follow-up Information    Celene Squibb, MD. Schedule an appointment as soon as possible for a visit in 2 week(s).   Specialty:  Internal Medicine Contact information: [place] [address] [phone]            The results of significant diagnostics from this hospitalization (including imaging, microbiology, ancillary and laboratory) are listed below for reference.    Significant Diagnostic Studies: Dg Chest 2 View  Result Date: 12/22/2017 CLINICAL DATA:  COPD.  Cough.  Recent pneumonia. EXAM: CHEST  2 VIEW COMPARISON:  11/15/2017. FINDINGS: Normal sized heart. The previously demonstrated left mid lung airspace opacity has cleared. Both lungs are currently clear. The lungs remain hyperexpanded with mild diffuse peribronchial thickening. Stable post CABG changes and left atrial clip. Diffuse osteopenia and  mild thoracolumbar spine degenerative changes. IMPRESSION: 1. Resolved left lung pneumonia. 2. Stable changes of COPD and chronic bronchitis. Electronically Signed   By: Claudie Revering M.D.   On: 12/22/2017 16:15   Ct Head Wo Contrast  Result Date: 01/07/2018 CLINICAL DATA:  Altered level of consciousness. Multiple falls. Weakness. EXAM: CT HEAD WITHOUT CONTRAST TECHNIQUE: Contiguous axial images were obtained from the base of the skull through the vertex without intravenous contrast. COMPARISON:  10/25/2013 head CT. FINDINGS: Brain: No evidence of parenchymal hemorrhage or extra-axial fluid collection. No mass lesion, mass effect, or midline shift. No CT evidence of acute infarction. Nonspecific  mild subcortical and periventricular white matter hypodensity, most in keeping with chronic small vessel ischemic change. Cerebral volume is age appropriate. No ventriculomegaly. Vascular: No acute abnormality. Skull: No evidence of calvarial fracture. Sinuses/Orbits: Mucoperiosteal thickening throughout paranasal sinuses, most prominent in the left maxillary sinus. No fluid levels. Other: Mild scattered gas in the right deep maxillary soft tissues is presumably iatrogenic related to peripheral IV placement. The mastoid air cells are unopacified. IMPRESSION: 1.  No evidence of acute intracranial abnormality. 2. Mild chronic small vessel ischemic changes in the cerebral white matter. 3. Chronic appearing paranasal sinusitis. Electronically Signed   By: Ilona Sorrel M.D.   On: 01/07/2018 21:30   Dg Chest Port 1 View  Result Date: 01/09/2018 CLINICAL DATA:  Shortness of breath. EXAM: PORTABLE CHEST 1 VIEW COMPARISON:  Radiographs 2 days prior 01/07/2018 FINDINGS: Post median sternotomy and left atrial clipping. Mild cardiomegaly is similar to prior exam. Progressive peribronchial cuffing and interstitial thickening from prior exam suspicious for pulmonary edema. No confluent airspace disease. Suspect small pleural  effusions. No pneumothorax. IMPRESSION: 1. Increasing interstitial and peribronchial thickening suspicious for pulmonary edema. Suspect small pleural effusions. 2. Unchanged cardiomegaly.  Aortic atherosclerosis. Electronically Signed   By: Jeb Levering M.D.   On: 01/09/2018 23:13   Dg Chest Port 1 View  Result Date: 01/07/2018 CLINICAL DATA:  Cough and dyspnea. Patient fell earlier this evening. Third fall in 3 weeks. EXAM: PORTABLE CHEST 1 VIEW COMPARISON:  12/22/2017 FINDINGS: Heart is top-normal in size. There is aortic atherosclerosis at the arch without aneurysm. The patient is status post median sternotomy and post CABG change. Left atrial appendage clip is also redemonstrated. Pulmonary vascular congestion is noted without acute pneumonic consolidation, effusion or pneumothorax. No acute osseous abnormality. IMPRESSION: Status post CABG with borderline cardiomegaly. Aortic atherosclerosis. Mild pulmonary vascular congestion. Electronically Signed   By: Ashley Royalty M.D.   On: 01/07/2018 19:58   US Abdomen Limited Ruq  Result Date: 01/08/2018 CLINICAL DATA:  Elevated LFTs. EXAM: ULTRASOUND ABDOMEN LIMITED RIGHT UPPER QUADRANT COMPARISON:  CT abdomen pelvis dated December 18, 2014. FINDINGS: Gallbladder: No gallstones or wall thickening visualized. No sonographic Murphy sign noted by sonographer. Common bile duct: Diameter: 7 mm, at the upper limits of normal in size. Liver: No focal lesion identified. Within normal limits in parenchymal echogenicity. Portal vein is patent on color Doppler imaging with normal direction of blood flow towards the liver. Incidental note is made of a small right pleural effusion. IMPRESSION: 1. Small right pleural effusion. Otherwise normal right upper quadrant ultrasound. Electronically Signed   By: Titus Dubin M.D.   On: 01/08/2018 09:13    Microbiology: Recent Results (from the past 240 hour(s))  Blood culture (routine x 2)     Status: None   Collection  Time: 01/07/18  7:30 PM  Result Value Ref Range Status   Specimen Description RIGHT ANTECUBITAL  Final   Special Requests   Final    BOTTLES DRAWN AEROBIC ONLY Blood Culture adequate volume   Culture   Final    NO GROWTH 5 DAYS Performed at [facility], [address], [place], [place] [address]    Report Status 01/12/2018 FINAL  Final  Blood culture (routine x 2)     Status: None   Collection Time: 01/07/18  7:37 PM  Result Value Ref Range Status   Specimen Description BLOOD LEFT FOREARM  Final   Special Requests   Final    BOTTLES DRAWN AEROBIC AND ANAEROBIC Blood  Culture adequate volume   Culture   Final    NO GROWTH 5 DAYS Performed at [facility], [address] [place], [place] [address]    Report Status 01/12/2018 FINAL  Final  Urine Culture     Status: None   Collection Time: 01/07/18  8:48 PM  Result Value Ref Range Status   Specimen Description   Final    URINE, CATHETERIZED Performed at [facility], [address], [place], [place] [address]    Special Requests   Final    NONE Performed at [facility], [address] [place], [place] [address]    Culture   Final    NO GROWTH Performed at [facility], [place] [address] [place], [place] [address]    Report Status 01/09/2018 FINAL  Final  Respiratory Panel by PCR     Status: Abnormal   Collection Time: 01/08/18  4:35 PM  Result Value Ref Range Status   Adenovirus NOT DETECTED NOT DETECTED Final   Coronavirus 229E NOT DETECTED NOT DETECTED Final   Coronavirus HKU1 NOT DETECTED NOT DETECTED Final   Coronavirus NL63 NOT DETECTED NOT DETECTED Final   Coronavirus OC43 NOT DETECTED NOT DETECTED Final   Metapneumovirus NOT DETECTED NOT DETECTED Final   Rhinovirus / Enterovirus NOT DETECTED NOT DETECTED Final   Influenza A NOT DETECTED NOT DETECTED Final   Influenza B NOT DETECTED NOT DETECTED Final   Parainfluenza Virus 1 NOT DETECTED NOT DETECTED Final   Parainfluenza Virus 2 NOT DETECTED NOT DETECTED Final    Parainfluenza Virus 3 NOT DETECTED NOT DETECTED Final   Parainfluenza Virus 4 NOT DETECTED NOT DETECTED Final   Respiratory Syncytial Virus DETECTED (A) NOT DETECTED Final    Comment: CRITICAL RESULT CALLED TO, READ BACK BY AND VERIFIED WITH: LHILTON,RN @1020  01/09/18 BY LHOWARD    Bordetella pertussis NOT DETECTED NOT DETECTED Final   Chlamydophila pneumoniae NOT DETECTED NOT DETECTED Final   Mycoplasma pneumoniae NOT DETECTED NOT DETECTED Final    Comment: Performed at [facility], [place] [address], [place], [place] [address]  MRSA PCR Screening     Status: None   Collection Time: 01/08/18  4:40 PM  Result Value Ref Range Status   MRSA by PCR NEGATIVE NEGATIVE Final    Comment:        The GeneXpert MRSA Assay (FDA approved for NASAL specimens only), is one component of a comprehensive MRSA colonization surveillance program. It is not intended to diagnose MRSA infection nor to guide or monitor treatment for MRSA infections. Performed at [facility], [address] [place], [place] [address]      Labs: Basic Metabolic Panel: Recent Labs  Lab 01/08/18 0600 01/09/18 0558 01/10/18 0503 01/11/18 0353 01/12/18 0407  NA 132* 138 136 135 132*  K 4.5 4.3 4.6 4.6 4.0  CL 99* 103 97* 95* 91*  CO2 23 25 28 31 30   GLUCOSE 83 291* 221* 208* 223*  BUN 39* 24* 25* 26* 29*  CREATININE 1.77* 0.76 0.59 0.59 0.54  CALCIUM 7.9* 8.7* 9.0 9.1 9.0  MG  --   --  1.4* 1.8 1.8   Liver Function Tests: Recent Labs  Lab 01/07/18 1909 01/08/18 0600 01/09/18 0558 01/11/18 0353 01/12/18 0407  AST 268* 176* 112* 51* 30  ALT 252* 185* 171* 135* 103*  ALKPHOS 71 58 55 60 60  BILITOT 0.6 0.9 0.8 0.8 0.8  PROT 7.0 5.5* 6.0* 6.1* 6.0*  ALBUMIN 3.7 2.7* 3.0* 3.1*  3.1*   No results for input(s): LIPASE, AMYLASE in the last 168 hours. No results for input(s): AMMONIA in the last 168 hours. CBC: Recent Labs  Lab 01/07/18 1909 01/08/18 0600 01/09/18 0558 01/10/18 0503  WBC 9.7  7.5 5.1 15.8*  NEUTROABS 5.9 4.6  --   --   HGB 10.8* 8.9* 9.2* 9.5*  HCT 33.9* 28.8* 28.6* 29.7*  MCV 88.1 90.6 87.7 89.5  PLT 215 182 191 231   Cardiac Enzymes: Recent Labs  Lab 01/07/18 1909 01/08/18 0600 01/10/18 0503  CKTOTAL  --  487* 101  TROPONINI 0.04* 0.03*  --    BNP: BNP (last 3 results) Recent Labs    11/15/17 1146 01/07/18 1909 01/10/18 0817  BNP 277.0* 269.0* 626.0*    ProBNP (last 3 results) No results for input(s): PROBNP in the last 8760 hours.  CBG: Recent Labs  Lab 01/11/18 1459 01/11/18 1628 01/11/18 2109 01/12/18 0745 01/12/18 1143  GLUCAP 167* 168* 151* 249* 364*       Signed:  [place] [facility] Pager: [phone] 01/12/2018, 6:29 PM

## 2018-01-13 ENCOUNTER — Other Ambulatory Visit: Payer: Self-pay | Admitting: Pharmacist

## 2018-01-13 ENCOUNTER — Other Ambulatory Visit: Payer: Self-pay | Admitting: *Deleted

## 2018-01-13 DIAGNOSIS — I5032 Chronic diastolic (congestive) heart failure: Secondary | ICD-10-CM | POA: Diagnosis not present

## 2018-01-13 DIAGNOSIS — M6281 Muscle weakness (generalized): Secondary | ICD-10-CM | POA: Diagnosis not present

## 2018-01-13 DIAGNOSIS — I110 Hypertensive heart disease with heart failure: Secondary | ICD-10-CM | POA: Diagnosis not present

## 2018-01-13 DIAGNOSIS — I959 Hypotension, unspecified: Secondary | ICD-10-CM | POA: Diagnosis not present

## 2018-01-13 NOTE — Patient Outreach (Signed)
[facility] [facility]) Care Management  01/13/2018  Mary [name] [birth year] [id number]  Received in-basket message from [place] patient had not returned calls after three attempts.    Successful phone outreach to patient today, HIPAA details verified.  Noted patient was admitted at [facility] 3/15-3/20/19.    Discussed with patient [employer] patient assistance application was mailed to her last month---she reports she doesn't know if she got it.  Discussed [facility] received prescriber portion back from Dr Nevada Crane for Januvia and Proventil.  Patient reports she is still interested in applying and would like a new application.   Offered to review her discharge medications as it appears she had medication changes---she reports she has reviewed medications and got them straightened out by her family/friends and plans to see her PCP.   She did not wish to review her medications with [facility] Pharmacist.   Plan:  Will send patient another [employer] patient assistance application---will have [place] send this.   Address in chart verified.    Will place call to patient next week.   Will update [facility] RN Kim of patient's recent discharge.    Karrie Meres, PharmD, [place] [phone]

## 2018-01-13 NOTE — Patient Outreach (Signed)
[facility]) Care Management  01/13/2018  CARMINE YOUNGBERG 08-05-1951 [id number]  Transition of care week one  Called Mrs [place] to initiate transition of care assessment No concerns voiced today except that she was fatigue but she has her sister, Velva Harman staying with her  She reports she is scheduled to see primary MD, doctor Nevada Crane  on "next Wednesday or Thursday and I see doctor Claiborne Billings on April sixteen "It is going to take me some time"     Confirms [facility] pharmacist called her state she will filled forms and return   [facility] come tomorrow around 0930-1000 Denies need of oxygen  Sister, Velva Harman  and neighbor to stay with her   Plan CM will continue to follow Mrs Daddona for transition of care services as requested per referral    Joelene Millin L. Lavina Hamman, RN, BSN, [place] Coordinator [phone] week day mobile

## 2018-01-14 ENCOUNTER — Other Ambulatory Visit: Payer: Self-pay | Admitting: Pharmacy Technician

## 2018-01-14 DIAGNOSIS — Z87891 Personal history of nicotine dependence: Secondary | ICD-10-CM | POA: Diagnosis not present

## 2018-01-14 DIAGNOSIS — J9601 Acute respiratory failure with hypoxia: Secondary | ICD-10-CM | POA: Diagnosis not present

## 2018-01-14 DIAGNOSIS — Z7982 Long term (current) use of aspirin: Secondary | ICD-10-CM | POA: Diagnosis not present

## 2018-01-14 DIAGNOSIS — I110 Hypertensive heart disease with heart failure: Secondary | ICD-10-CM | POA: Diagnosis not present

## 2018-01-14 DIAGNOSIS — I5033 Acute on chronic diastolic (congestive) heart failure: Secondary | ICD-10-CM | POA: Diagnosis not present

## 2018-01-14 DIAGNOSIS — I2510 Atherosclerotic heart disease of native coronary artery without angina pectoris: Secondary | ICD-10-CM | POA: Diagnosis not present

## 2018-01-14 DIAGNOSIS — M1991 Primary osteoarthritis, unspecified site: Secondary | ICD-10-CM | POA: Diagnosis not present

## 2018-01-14 DIAGNOSIS — E1142 Type 2 diabetes mellitus with diabetic polyneuropathy: Secondary | ICD-10-CM | POA: Diagnosis not present

## 2018-01-14 DIAGNOSIS — Z7901 Long term (current) use of anticoagulants: Secondary | ICD-10-CM | POA: Diagnosis not present

## 2018-01-14 DIAGNOSIS — I4891 Unspecified atrial fibrillation: Secondary | ICD-10-CM | POA: Diagnosis not present

## 2018-01-14 DIAGNOSIS — E1140 Type 2 diabetes mellitus with diabetic neuropathy, unspecified: Secondary | ICD-10-CM | POA: Diagnosis not present

## 2018-01-14 DIAGNOSIS — J441 Chronic obstructive pulmonary disease with (acute) exacerbation: Secondary | ICD-10-CM | POA: Diagnosis not present

## 2018-01-14 DIAGNOSIS — Z7951 Long term (current) use of inhaled steroids: Secondary | ICD-10-CM | POA: Diagnosis not present

## 2018-01-14 DIAGNOSIS — Z9114 Patient's other noncompliance with medication regimen: Secondary | ICD-10-CM | POA: Diagnosis not present

## 2018-01-14 DIAGNOSIS — G4709 Other insomnia: Secondary | ICD-10-CM | POA: Diagnosis not present

## 2018-01-14 DIAGNOSIS — Z7984 Long term (current) use of oral hypoglycemic drugs: Secondary | ICD-10-CM | POA: Diagnosis not present

## 2018-01-14 DIAGNOSIS — I959 Hypotension, unspecified: Secondary | ICD-10-CM | POA: Diagnosis not present

## 2018-01-14 DIAGNOSIS — E785 Hyperlipidemia, unspecified: Secondary | ICD-10-CM | POA: Diagnosis not present

## 2018-01-14 NOTE — Patient Outreach (Signed)
[facility]) Care Management  01/14/2018  Mary [name] Mar 24, 1951 [id number]   Received request from Pharmacist Karrie Meres to mail out patient portion of [employer] patient assistance application.Mailed 03/22.  Will follow up with patient in 7 days.  Maud Deed [place], [place] Management [phone]

## 2018-01-18 ENCOUNTER — Other Ambulatory Visit: Payer: Self-pay | Admitting: Pharmacist

## 2018-01-18 NOTE — Patient Outreach (Signed)
[facility]) Care Management  01/18/2018  Mary [name] 09-07-1951 [id number]  Successful phone outreach to patient, HIPAA details verified.  Updated patient another patient assistance application for [employer] was sent to her address via mail.   Reminded patient she will need to complete application for [employer] patient assistance in order to see if she is eligible for manufacturer patient assistance for Januvia (sitagliptin) or Proventil (albuterol inhaler).  Given patient's recent discharge, [facility] Pharmacist offered to complete a medication review with her.  She had declined this last week.  Today patient states she is not interested in reviewing medications with [facility] Pharmacist at this time, but she may be at a later date.    Plan:   Advised patient [place] Technician Caryl Pina will be working with her to complete application for [employer] patient assistance evaluation.   Encouraged patient to contact [place] if patient would like a [facility] Pharmacist to review her medications with her in the future.    Note routed to [place].   Karrie Meres, PharmD, [place] [phone]

## 2018-01-19 DIAGNOSIS — I482 Chronic atrial fibrillation: Secondary | ICD-10-CM | POA: Diagnosis not present

## 2018-01-19 DIAGNOSIS — I959 Hypotension, unspecified: Secondary | ICD-10-CM | POA: Diagnosis not present

## 2018-01-19 DIAGNOSIS — J441 Chronic obstructive pulmonary disease with (acute) exacerbation: Secondary | ICD-10-CM | POA: Diagnosis not present

## 2018-01-19 DIAGNOSIS — G4709 Other insomnia: Secondary | ICD-10-CM | POA: Diagnosis not present

## 2018-01-19 DIAGNOSIS — G9341 Metabolic encephalopathy: Secondary | ICD-10-CM | POA: Diagnosis not present

## 2018-01-19 DIAGNOSIS — E1140 Type 2 diabetes mellitus with diabetic neuropathy, unspecified: Secondary | ICD-10-CM | POA: Diagnosis not present

## 2018-01-19 DIAGNOSIS — N179 Acute kidney failure, unspecified: Secondary | ICD-10-CM | POA: Diagnosis not present

## 2018-01-19 DIAGNOSIS — J9601 Acute respiratory failure with hypoxia: Secondary | ICD-10-CM | POA: Diagnosis not present

## 2018-01-20 ENCOUNTER — Other Ambulatory Visit: Payer: Self-pay | Admitting: *Deleted

## 2018-01-20 DIAGNOSIS — Z7951 Long term (current) use of inhaled steroids: Secondary | ICD-10-CM | POA: Diagnosis not present

## 2018-01-20 DIAGNOSIS — Z7982 Long term (current) use of aspirin: Secondary | ICD-10-CM | POA: Diagnosis not present

## 2018-01-20 DIAGNOSIS — I2510 Atherosclerotic heart disease of native coronary artery without angina pectoris: Secondary | ICD-10-CM | POA: Diagnosis not present

## 2018-01-20 DIAGNOSIS — E785 Hyperlipidemia, unspecified: Secondary | ICD-10-CM | POA: Diagnosis not present

## 2018-01-20 DIAGNOSIS — Z9114 Patient's other noncompliance with medication regimen: Secondary | ICD-10-CM | POA: Diagnosis not present

## 2018-01-20 DIAGNOSIS — I5033 Acute on chronic diastolic (congestive) heart failure: Secondary | ICD-10-CM | POA: Diagnosis not present

## 2018-01-20 DIAGNOSIS — E1142 Type 2 diabetes mellitus with diabetic polyneuropathy: Secondary | ICD-10-CM | POA: Diagnosis not present

## 2018-01-20 DIAGNOSIS — Z7901 Long term (current) use of anticoagulants: Secondary | ICD-10-CM | POA: Diagnosis not present

## 2018-01-20 DIAGNOSIS — Z7984 Long term (current) use of oral hypoglycemic drugs: Secondary | ICD-10-CM | POA: Diagnosis not present

## 2018-01-20 DIAGNOSIS — I4891 Unspecified atrial fibrillation: Secondary | ICD-10-CM | POA: Diagnosis not present

## 2018-01-20 DIAGNOSIS — M1991 Primary osteoarthritis, unspecified site: Secondary | ICD-10-CM | POA: Diagnosis not present

## 2018-01-20 DIAGNOSIS — J441 Chronic obstructive pulmonary disease with (acute) exacerbation: Secondary | ICD-10-CM | POA: Diagnosis not present

## 2018-01-20 DIAGNOSIS — I110 Hypertensive heart disease with heart failure: Secondary | ICD-10-CM | POA: Diagnosis not present

## 2018-01-20 DIAGNOSIS — Z87891 Personal history of nicotine dependence: Secondary | ICD-10-CM | POA: Diagnosis not present

## 2018-01-20 NOTE — Patient Outreach (Signed)
Mary [name]) Care Management  01/20/2018  Mary [name] 1950/12/06 [id number]  Transition of care week two   Call to [name] who confirms she sees Dr Nevada Crane on Wednesday  She still has not completed the pharmacy patient assistance  Application that [place] staff has sent her yet but has received the forms "I will get to them" She reports she is not driving "still too weak when standing on my legs" She continues to work with [facility] staff PT is Anne Ng, RN is Journalist, newspaper and OT is Tim    Plans: To continue to follow [name] for transition of care services weekly   Joelene Millin L. Lavina Hamman, RN, BSN, [place] Coordinator [phone] week day mobile

## 2018-01-21 ENCOUNTER — Other Ambulatory Visit: Payer: Self-pay | Admitting: Pharmacy Technician

## 2018-01-21 DIAGNOSIS — E785 Hyperlipidemia, unspecified: Secondary | ICD-10-CM | POA: Diagnosis not present

## 2018-01-21 DIAGNOSIS — E1142 Type 2 diabetes mellitus with diabetic polyneuropathy: Secondary | ICD-10-CM | POA: Diagnosis not present

## 2018-01-21 DIAGNOSIS — I5033 Acute on chronic diastolic (congestive) heart failure: Secondary | ICD-10-CM | POA: Diagnosis not present

## 2018-01-21 DIAGNOSIS — Z7951 Long term (current) use of inhaled steroids: Secondary | ICD-10-CM | POA: Diagnosis not present

## 2018-01-21 DIAGNOSIS — Z7901 Long term (current) use of anticoagulants: Secondary | ICD-10-CM | POA: Diagnosis not present

## 2018-01-21 DIAGNOSIS — M1991 Primary osteoarthritis, unspecified site: Secondary | ICD-10-CM | POA: Diagnosis not present

## 2018-01-21 DIAGNOSIS — I4891 Unspecified atrial fibrillation: Secondary | ICD-10-CM | POA: Diagnosis not present

## 2018-01-21 DIAGNOSIS — J441 Chronic obstructive pulmonary disease with (acute) exacerbation: Secondary | ICD-10-CM | POA: Diagnosis not present

## 2018-01-21 DIAGNOSIS — I2510 Atherosclerotic heart disease of native coronary artery without angina pectoris: Secondary | ICD-10-CM | POA: Diagnosis not present

## 2018-01-21 DIAGNOSIS — Z7982 Long term (current) use of aspirin: Secondary | ICD-10-CM | POA: Diagnosis not present

## 2018-01-21 DIAGNOSIS — Z7984 Long term (current) use of oral hypoglycemic drugs: Secondary | ICD-10-CM | POA: Diagnosis not present

## 2018-01-21 DIAGNOSIS — I110 Hypertensive heart disease with heart failure: Secondary | ICD-10-CM | POA: Diagnosis not present

## 2018-01-21 DIAGNOSIS — Z87891 Personal history of nicotine dependence: Secondary | ICD-10-CM | POA: Diagnosis not present

## 2018-01-21 DIAGNOSIS — Z9114 Patient's other noncompliance with medication regimen: Secondary | ICD-10-CM | POA: Diagnosis not present

## 2018-01-21 NOTE — Patient Outreach (Signed)
[facility]) Care Management  01/21/2018  [name] [birth year] [id number]   Successful outreach call to Ms. Mary [name], HIPAA identifiers verified. Ms. Mary [name] informed me that she did receive the [employer] patient assistance application that I mailed to her and that she would fill it out and mail it back into to me.  Will follow up with patient once received and mailed into [employer] patient assistance  Mary [name]. [place], [place] Management [phone]

## 2018-01-24 DIAGNOSIS — M1991 Primary osteoarthritis, unspecified site: Secondary | ICD-10-CM | POA: Diagnosis not present

## 2018-01-24 DIAGNOSIS — Z7901 Long term (current) use of anticoagulants: Secondary | ICD-10-CM | POA: Diagnosis not present

## 2018-01-24 DIAGNOSIS — J441 Chronic obstructive pulmonary disease with (acute) exacerbation: Secondary | ICD-10-CM | POA: Diagnosis not present

## 2018-01-24 DIAGNOSIS — E1142 Type 2 diabetes mellitus with diabetic polyneuropathy: Secondary | ICD-10-CM | POA: Diagnosis not present

## 2018-01-24 DIAGNOSIS — E785 Hyperlipidemia, unspecified: Secondary | ICD-10-CM | POA: Diagnosis not present

## 2018-01-24 DIAGNOSIS — Z7951 Long term (current) use of inhaled steroids: Secondary | ICD-10-CM | POA: Diagnosis not present

## 2018-01-24 DIAGNOSIS — Z87891 Personal history of nicotine dependence: Secondary | ICD-10-CM | POA: Diagnosis not present

## 2018-01-24 DIAGNOSIS — Z9114 Patient's other noncompliance with medication regimen: Secondary | ICD-10-CM | POA: Diagnosis not present

## 2018-01-24 DIAGNOSIS — Z7982 Long term (current) use of aspirin: Secondary | ICD-10-CM | POA: Diagnosis not present

## 2018-01-24 DIAGNOSIS — I110 Hypertensive heart disease with heart failure: Secondary | ICD-10-CM | POA: Diagnosis not present

## 2018-01-24 DIAGNOSIS — I2510 Atherosclerotic heart disease of native coronary artery without angina pectoris: Secondary | ICD-10-CM | POA: Diagnosis not present

## 2018-01-24 DIAGNOSIS — Z7984 Long term (current) use of oral hypoglycemic drugs: Secondary | ICD-10-CM | POA: Diagnosis not present

## 2018-01-24 DIAGNOSIS — I5033 Acute on chronic diastolic (congestive) heart failure: Secondary | ICD-10-CM | POA: Diagnosis not present

## 2018-01-24 DIAGNOSIS — I4891 Unspecified atrial fibrillation: Secondary | ICD-10-CM | POA: Diagnosis not present

## 2018-01-26 DIAGNOSIS — I4891 Unspecified atrial fibrillation: Secondary | ICD-10-CM | POA: Diagnosis not present

## 2018-01-26 DIAGNOSIS — Z9114 Patient's other noncompliance with medication regimen: Secondary | ICD-10-CM | POA: Diagnosis not present

## 2018-01-26 DIAGNOSIS — E785 Hyperlipidemia, unspecified: Secondary | ICD-10-CM | POA: Diagnosis not present

## 2018-01-26 DIAGNOSIS — I2510 Atherosclerotic heart disease of native coronary artery without angina pectoris: Secondary | ICD-10-CM | POA: Diagnosis not present

## 2018-01-26 DIAGNOSIS — Z7951 Long term (current) use of inhaled steroids: Secondary | ICD-10-CM | POA: Diagnosis not present

## 2018-01-26 DIAGNOSIS — Z87891 Personal history of nicotine dependence: Secondary | ICD-10-CM | POA: Diagnosis not present

## 2018-01-26 DIAGNOSIS — Z7984 Long term (current) use of oral hypoglycemic drugs: Secondary | ICD-10-CM | POA: Diagnosis not present

## 2018-01-26 DIAGNOSIS — M1991 Primary osteoarthritis, unspecified site: Secondary | ICD-10-CM | POA: Diagnosis not present

## 2018-01-26 DIAGNOSIS — E1142 Type 2 diabetes mellitus with diabetic polyneuropathy: Secondary | ICD-10-CM | POA: Diagnosis not present

## 2018-01-26 DIAGNOSIS — J441 Chronic obstructive pulmonary disease with (acute) exacerbation: Secondary | ICD-10-CM | POA: Diagnosis not present

## 2018-01-26 DIAGNOSIS — I5033 Acute on chronic diastolic (congestive) heart failure: Secondary | ICD-10-CM | POA: Diagnosis not present

## 2018-01-26 DIAGNOSIS — Z7982 Long term (current) use of aspirin: Secondary | ICD-10-CM | POA: Diagnosis not present

## 2018-01-26 DIAGNOSIS — Z7901 Long term (current) use of anticoagulants: Secondary | ICD-10-CM | POA: Diagnosis not present

## 2018-01-26 DIAGNOSIS — I110 Hypertensive heart disease with heart failure: Secondary | ICD-10-CM | POA: Diagnosis not present

## 2018-01-27 ENCOUNTER — Other Ambulatory Visit: Payer: Self-pay | Admitting: *Deleted

## 2018-01-27 NOTE — Patient Outreach (Signed)
[facility]) Care Management  01/27/2018  Mary [name] [birth year] [id number]   Transition of care week 3   Call to Mary [name] but no answer when CM called various numbers    Plans CM to attempt again to reach Mary [name] for transition of care services   Demetrias Goodbar L. Lavina Hamman, RN, BSN, [place] Coordinator [phone] week day mobile

## 2018-01-28 ENCOUNTER — Ambulatory Visit: Payer: Medicare Other | Admitting: Cardiovascular Disease

## 2018-01-31 DIAGNOSIS — E785 Hyperlipidemia, unspecified: Secondary | ICD-10-CM | POA: Diagnosis not present

## 2018-01-31 DIAGNOSIS — M1991 Primary osteoarthritis, unspecified site: Secondary | ICD-10-CM | POA: Diagnosis not present

## 2018-01-31 DIAGNOSIS — I4891 Unspecified atrial fibrillation: Secondary | ICD-10-CM | POA: Diagnosis not present

## 2018-01-31 DIAGNOSIS — Z7982 Long term (current) use of aspirin: Secondary | ICD-10-CM | POA: Diagnosis not present

## 2018-01-31 DIAGNOSIS — I110 Hypertensive heart disease with heart failure: Secondary | ICD-10-CM | POA: Diagnosis not present

## 2018-01-31 DIAGNOSIS — Z7951 Long term (current) use of inhaled steroids: Secondary | ICD-10-CM | POA: Diagnosis not present

## 2018-01-31 DIAGNOSIS — E1142 Type 2 diabetes mellitus with diabetic polyneuropathy: Secondary | ICD-10-CM | POA: Diagnosis not present

## 2018-01-31 DIAGNOSIS — J441 Chronic obstructive pulmonary disease with (acute) exacerbation: Secondary | ICD-10-CM | POA: Diagnosis not present

## 2018-01-31 DIAGNOSIS — Z7901 Long term (current) use of anticoagulants: Secondary | ICD-10-CM | POA: Diagnosis not present

## 2018-01-31 DIAGNOSIS — Z87891 Personal history of nicotine dependence: Secondary | ICD-10-CM | POA: Diagnosis not present

## 2018-01-31 DIAGNOSIS — Z9114 Patient's other noncompliance with medication regimen: Secondary | ICD-10-CM | POA: Diagnosis not present

## 2018-01-31 DIAGNOSIS — I5033 Acute on chronic diastolic (congestive) heart failure: Secondary | ICD-10-CM | POA: Diagnosis not present

## 2018-01-31 DIAGNOSIS — I2510 Atherosclerotic heart disease of native coronary artery without angina pectoris: Secondary | ICD-10-CM | POA: Diagnosis not present

## 2018-01-31 DIAGNOSIS — Z7984 Long term (current) use of oral hypoglycemic drugs: Secondary | ICD-10-CM | POA: Diagnosis not present

## 2018-02-01 DIAGNOSIS — Z7984 Long term (current) use of oral hypoglycemic drugs: Secondary | ICD-10-CM | POA: Diagnosis not present

## 2018-02-01 DIAGNOSIS — Z9114 Patient's other noncompliance with medication regimen: Secondary | ICD-10-CM | POA: Diagnosis not present

## 2018-02-01 DIAGNOSIS — Z87891 Personal history of nicotine dependence: Secondary | ICD-10-CM | POA: Diagnosis not present

## 2018-02-01 DIAGNOSIS — I2510 Atherosclerotic heart disease of native coronary artery without angina pectoris: Secondary | ICD-10-CM | POA: Diagnosis not present

## 2018-02-01 DIAGNOSIS — E785 Hyperlipidemia, unspecified: Secondary | ICD-10-CM | POA: Diagnosis not present

## 2018-02-01 DIAGNOSIS — M1991 Primary osteoarthritis, unspecified site: Secondary | ICD-10-CM | POA: Diagnosis not present

## 2018-02-01 DIAGNOSIS — E1142 Type 2 diabetes mellitus with diabetic polyneuropathy: Secondary | ICD-10-CM | POA: Diagnosis not present

## 2018-02-01 DIAGNOSIS — I110 Hypertensive heart disease with heart failure: Secondary | ICD-10-CM | POA: Diagnosis not present

## 2018-02-01 DIAGNOSIS — Z7901 Long term (current) use of anticoagulants: Secondary | ICD-10-CM | POA: Diagnosis not present

## 2018-02-01 DIAGNOSIS — J441 Chronic obstructive pulmonary disease with (acute) exacerbation: Secondary | ICD-10-CM | POA: Diagnosis not present

## 2018-02-01 DIAGNOSIS — Z7982 Long term (current) use of aspirin: Secondary | ICD-10-CM | POA: Diagnosis not present

## 2018-02-01 DIAGNOSIS — Z7951 Long term (current) use of inhaled steroids: Secondary | ICD-10-CM | POA: Diagnosis not present

## 2018-02-01 DIAGNOSIS — I4891 Unspecified atrial fibrillation: Secondary | ICD-10-CM | POA: Diagnosis not present

## 2018-02-01 DIAGNOSIS — I5033 Acute on chronic diastolic (congestive) heart failure: Secondary | ICD-10-CM | POA: Diagnosis not present

## 2018-02-04 DIAGNOSIS — I4891 Unspecified atrial fibrillation: Secondary | ICD-10-CM | POA: Diagnosis not present

## 2018-02-04 DIAGNOSIS — Z7901 Long term (current) use of anticoagulants: Secondary | ICD-10-CM | POA: Diagnosis not present

## 2018-02-04 DIAGNOSIS — Z87891 Personal history of nicotine dependence: Secondary | ICD-10-CM | POA: Diagnosis not present

## 2018-02-04 DIAGNOSIS — E785 Hyperlipidemia, unspecified: Secondary | ICD-10-CM | POA: Diagnosis not present

## 2018-02-04 DIAGNOSIS — J441 Chronic obstructive pulmonary disease with (acute) exacerbation: Secondary | ICD-10-CM | POA: Diagnosis not present

## 2018-02-04 DIAGNOSIS — Z7984 Long term (current) use of oral hypoglycemic drugs: Secondary | ICD-10-CM | POA: Diagnosis not present

## 2018-02-04 DIAGNOSIS — Z7951 Long term (current) use of inhaled steroids: Secondary | ICD-10-CM | POA: Diagnosis not present

## 2018-02-04 DIAGNOSIS — I5033 Acute on chronic diastolic (congestive) heart failure: Secondary | ICD-10-CM | POA: Diagnosis not present

## 2018-02-04 DIAGNOSIS — Z9114 Patient's other noncompliance with medication regimen: Secondary | ICD-10-CM | POA: Diagnosis not present

## 2018-02-04 DIAGNOSIS — E1142 Type 2 diabetes mellitus with diabetic polyneuropathy: Secondary | ICD-10-CM | POA: Diagnosis not present

## 2018-02-04 DIAGNOSIS — I110 Hypertensive heart disease with heart failure: Secondary | ICD-10-CM | POA: Diagnosis not present

## 2018-02-04 DIAGNOSIS — Z7982 Long term (current) use of aspirin: Secondary | ICD-10-CM | POA: Diagnosis not present

## 2018-02-04 DIAGNOSIS — M1991 Primary osteoarthritis, unspecified site: Secondary | ICD-10-CM | POA: Diagnosis not present

## 2018-02-04 DIAGNOSIS — I2510 Atherosclerotic heart disease of native coronary artery without angina pectoris: Secondary | ICD-10-CM | POA: Diagnosis not present

## 2018-02-07 ENCOUNTER — Telehealth: Payer: Self-pay | Admitting: Cardiovascular Disease

## 2018-02-07 DIAGNOSIS — E785 Hyperlipidemia, unspecified: Secondary | ICD-10-CM | POA: Diagnosis not present

## 2018-02-07 DIAGNOSIS — E1142 Type 2 diabetes mellitus with diabetic polyneuropathy: Secondary | ICD-10-CM | POA: Diagnosis not present

## 2018-02-07 DIAGNOSIS — Z7951 Long term (current) use of inhaled steroids: Secondary | ICD-10-CM | POA: Diagnosis not present

## 2018-02-07 DIAGNOSIS — Z87891 Personal history of nicotine dependence: Secondary | ICD-10-CM | POA: Diagnosis not present

## 2018-02-07 DIAGNOSIS — Z7984 Long term (current) use of oral hypoglycemic drugs: Secondary | ICD-10-CM | POA: Diagnosis not present

## 2018-02-07 DIAGNOSIS — J441 Chronic obstructive pulmonary disease with (acute) exacerbation: Secondary | ICD-10-CM | POA: Diagnosis not present

## 2018-02-07 DIAGNOSIS — I4891 Unspecified atrial fibrillation: Secondary | ICD-10-CM | POA: Diagnosis not present

## 2018-02-07 DIAGNOSIS — I110 Hypertensive heart disease with heart failure: Secondary | ICD-10-CM | POA: Diagnosis not present

## 2018-02-07 DIAGNOSIS — I5033 Acute on chronic diastolic (congestive) heart failure: Secondary | ICD-10-CM | POA: Diagnosis not present

## 2018-02-07 DIAGNOSIS — Z7901 Long term (current) use of anticoagulants: Secondary | ICD-10-CM | POA: Diagnosis not present

## 2018-02-07 DIAGNOSIS — M1991 Primary osteoarthritis, unspecified site: Secondary | ICD-10-CM | POA: Diagnosis not present

## 2018-02-07 DIAGNOSIS — I2510 Atherosclerotic heart disease of native coronary artery without angina pectoris: Secondary | ICD-10-CM | POA: Diagnosis not present

## 2018-02-07 DIAGNOSIS — Z9114 Patient's other noncompliance with medication regimen: Secondary | ICD-10-CM | POA: Diagnosis not present

## 2018-02-07 DIAGNOSIS — Z7982 Long term (current) use of aspirin: Secondary | ICD-10-CM | POA: Diagnosis not present

## 2018-02-07 NOTE — Telephone Encounter (Signed)
Incoming call from the patient's home health nurse. She stated that the patient doesn't remember if she took her medication last night and this morning. Her blood pressure was 118/72 and her heart rate was 140. The nurse stated that the patient's oxygen level was 86 but after her proair treatment the level went up to 95% The nurse states that the patient's hands were cold and does not feel like the O2 readings were accurate. The patient denies shortness of breath and did not show any signs of cyanosis. The nurse stated that the patient is asymptomatic and was "acting like herself."  Per Dr. Claiborne Billings, the patient should go ahead and take her Metoprolol 50 mg and the Diltiazem 120 mg but stagger them. She will go ahead and take her Metoprolol now with the nurse and take her Diltiazem at dinner. The nurse will go back in the morning to make sure she takes her medication and to check her heart rate and blood pressure. She will call the office for an update.

## 2018-02-08 ENCOUNTER — Other Ambulatory Visit: Payer: Self-pay | Admitting: Pharmacy Technician

## 2018-02-08 ENCOUNTER — Other Ambulatory Visit: Payer: Self-pay | Admitting: Pharmacist

## 2018-02-08 ENCOUNTER — Telehealth: Payer: Self-pay | Admitting: Cardiovascular Disease

## 2018-02-08 DIAGNOSIS — I4891 Unspecified atrial fibrillation: Secondary | ICD-10-CM | POA: Diagnosis not present

## 2018-02-08 DIAGNOSIS — E785 Hyperlipidemia, unspecified: Secondary | ICD-10-CM | POA: Diagnosis not present

## 2018-02-08 DIAGNOSIS — Z7901 Long term (current) use of anticoagulants: Secondary | ICD-10-CM | POA: Diagnosis not present

## 2018-02-08 DIAGNOSIS — M1991 Primary osteoarthritis, unspecified site: Secondary | ICD-10-CM | POA: Diagnosis not present

## 2018-02-08 DIAGNOSIS — Z87891 Personal history of nicotine dependence: Secondary | ICD-10-CM | POA: Diagnosis not present

## 2018-02-08 DIAGNOSIS — E1142 Type 2 diabetes mellitus with diabetic polyneuropathy: Secondary | ICD-10-CM | POA: Diagnosis not present

## 2018-02-08 DIAGNOSIS — Z7951 Long term (current) use of inhaled steroids: Secondary | ICD-10-CM | POA: Diagnosis not present

## 2018-02-08 DIAGNOSIS — Z9114 Patient's other noncompliance with medication regimen: Secondary | ICD-10-CM | POA: Diagnosis not present

## 2018-02-08 DIAGNOSIS — I110 Hypertensive heart disease with heart failure: Secondary | ICD-10-CM | POA: Diagnosis not present

## 2018-02-08 DIAGNOSIS — I5033 Acute on chronic diastolic (congestive) heart failure: Secondary | ICD-10-CM | POA: Diagnosis not present

## 2018-02-08 DIAGNOSIS — I2510 Atherosclerotic heart disease of native coronary artery without angina pectoris: Secondary | ICD-10-CM | POA: Diagnosis not present

## 2018-02-08 DIAGNOSIS — Z7984 Long term (current) use of oral hypoglycemic drugs: Secondary | ICD-10-CM | POA: Diagnosis not present

## 2018-02-08 DIAGNOSIS — J441 Chronic obstructive pulmonary disease with (acute) exacerbation: Secondary | ICD-10-CM | POA: Diagnosis not present

## 2018-02-08 DIAGNOSIS — Z7982 Long term (current) use of aspirin: Secondary | ICD-10-CM | POA: Diagnosis not present

## 2018-02-08 NOTE — Patient Outreach (Signed)
[facility]) Care Management  02/08/2018  Mary [name] 1951-05-11 [id number]   Successful outreach to patient, HIPAA identifiers verified. Called to follow up with patient to see if she did indeed mail application back in. She stated that she didn't think she had. She stated she has been in the hospital and has had therapy and nurses come in and out and hasn't gotten any rest.  I asked if she still had the application and she stated she probably didn't.  I asked if she just wanted to contact us when she was ready to start the process of patient assistance again and she stated that she would prefer to do that.  Will route note to [place] for case closure  [employer]. [place], [place] Management [phone]

## 2018-02-08 NOTE — Telephone Encounter (Signed)
Returned called to Judson Roch from [employer]. She wanted to give an update on pt's condition from yesterday's call. She reports pt is feeling much better. HR 78, O2 99%, BP 130/66, and weight is back down to 114 lbs. She also report swelling is decreased with good output. Friday's appointment 4/19 verified and confirmed.

## 2018-02-08 NOTE — Telephone Encounter (Signed)
New Message    Mary [name] with [facility] is doing a follow up from yesterdays phone call. She stays that the patient is doing much better today. She is more oriented, her HR is 78 with slight irregularities but why better than yesterday. Oxygen was 99%, weight 114, lungs sound good no crackling no wheezing. She looks goods. Marland Kitchen

## 2018-02-08 NOTE — Patient Outreach (Signed)
[facility]) Care Management  02/08/2018  Mary [name] [birth year] [id number]  [place] staff had been working with patient on patient assistance application for medications.  Received message from Pharmacy Technician, Caryl Pina today, patient currently prefers to wait to complete application and contact [facility] when she is ready.  See note in chart from today.  Plan:  Pharmacy case will be closed.    [facility] RN updated.  Karrie Meres, PharmD, [place] [phone]

## 2018-02-09 ENCOUNTER — Other Ambulatory Visit: Payer: Self-pay | Admitting: *Deleted

## 2018-02-09 DIAGNOSIS — I4891 Unspecified atrial fibrillation: Secondary | ICD-10-CM | POA: Diagnosis not present

## 2018-02-09 DIAGNOSIS — I2510 Atherosclerotic heart disease of native coronary artery without angina pectoris: Secondary | ICD-10-CM | POA: Diagnosis not present

## 2018-02-09 DIAGNOSIS — E785 Hyperlipidemia, unspecified: Secondary | ICD-10-CM | POA: Diagnosis not present

## 2018-02-09 DIAGNOSIS — Z7984 Long term (current) use of oral hypoglycemic drugs: Secondary | ICD-10-CM | POA: Diagnosis not present

## 2018-02-09 DIAGNOSIS — I5033 Acute on chronic diastolic (congestive) heart failure: Secondary | ICD-10-CM | POA: Diagnosis not present

## 2018-02-09 DIAGNOSIS — E1142 Type 2 diabetes mellitus with diabetic polyneuropathy: Secondary | ICD-10-CM | POA: Diagnosis not present

## 2018-02-09 DIAGNOSIS — M1991 Primary osteoarthritis, unspecified site: Secondary | ICD-10-CM | POA: Diagnosis not present

## 2018-02-09 DIAGNOSIS — Z7951 Long term (current) use of inhaled steroids: Secondary | ICD-10-CM | POA: Diagnosis not present

## 2018-02-09 DIAGNOSIS — Z9114 Patient's other noncompliance with medication regimen: Secondary | ICD-10-CM | POA: Diagnosis not present

## 2018-02-09 DIAGNOSIS — I110 Hypertensive heart disease with heart failure: Secondary | ICD-10-CM | POA: Diagnosis not present

## 2018-02-09 DIAGNOSIS — Z7982 Long term (current) use of aspirin: Secondary | ICD-10-CM | POA: Diagnosis not present

## 2018-02-09 DIAGNOSIS — J441 Chronic obstructive pulmonary disease with (acute) exacerbation: Secondary | ICD-10-CM | POA: Diagnosis not present

## 2018-02-09 DIAGNOSIS — Z7901 Long term (current) use of anticoagulants: Secondary | ICD-10-CM | POA: Diagnosis not present

## 2018-02-09 DIAGNOSIS — Z87891 Personal history of nicotine dependence: Secondary | ICD-10-CM | POA: Diagnosis not present

## 2018-02-09 NOTE — Patient Outreach (Signed)
[facility]) Care Management  02/09/2018  Mary [name] [birth year] [id number]   Transition of care week 4 /care coordination/ pharmacy assistance/home health/case closure  [facility] CM received a call form Home health RN, Judson Roch about [name].  Cm discussed transition of care services for [name] and This CM complex [facility] CM visits to [name] since May 2018 with noted noncompliance concerns for plans of care and pharmacy assistance after various attempts. Judson Roch reports [name] has agreed to allow her to assist with blister packaging. CM thanked Sarah for assisting [name] CM received contact from [place] and Lennette Bihari of [facility] pharmacy about difficultly to get Mrs Staff again to engage for medication assistance programs. Cm thanked them for their attempts to assist this patient.  THN CM spoke with [name] at [phone] She reports she is doing well.  CM updated her on the collaboration with [place] staff and Sarah from [facility].  She confirms she has not worked with [place] staff and has agreed to let Judson Roch from [facility] assist her with blister packing services.  She still has not completed the forms for medication assistance programs.  She reports her sister has returned to her home and she is being visited by her neighbor Dot at intervals.  She reports her grand daughters are working "more these days"  Plans case closure cm goals met for transition of care but continues with noncompliance to [place] assistance program Letters to be sent to patient and MD     Joelene Millin L. Lavina Hamman, RN, BSN, [place] Coordinator [phone] week day mobile

## 2018-02-10 ENCOUNTER — Other Ambulatory Visit: Payer: Self-pay | Admitting: Cardiovascular Disease

## 2018-02-10 NOTE — Telephone Encounter (Signed)
REFILL 

## 2018-02-11 ENCOUNTER — Ambulatory Visit: Payer: Medicare Other | Admitting: Cardiovascular Disease

## 2018-02-16 DIAGNOSIS — Z7982 Long term (current) use of aspirin: Secondary | ICD-10-CM | POA: Diagnosis not present

## 2018-02-16 DIAGNOSIS — I5033 Acute on chronic diastolic (congestive) heart failure: Secondary | ICD-10-CM | POA: Diagnosis not present

## 2018-02-16 DIAGNOSIS — Z7984 Long term (current) use of oral hypoglycemic drugs: Secondary | ICD-10-CM | POA: Diagnosis not present

## 2018-02-16 DIAGNOSIS — Z87891 Personal history of nicotine dependence: Secondary | ICD-10-CM | POA: Diagnosis not present

## 2018-02-16 DIAGNOSIS — Z7951 Long term (current) use of inhaled steroids: Secondary | ICD-10-CM | POA: Diagnosis not present

## 2018-02-16 DIAGNOSIS — E785 Hyperlipidemia, unspecified: Secondary | ICD-10-CM | POA: Diagnosis not present

## 2018-02-16 DIAGNOSIS — I110 Hypertensive heart disease with heart failure: Secondary | ICD-10-CM | POA: Diagnosis not present

## 2018-02-16 DIAGNOSIS — I4891 Unspecified atrial fibrillation: Secondary | ICD-10-CM | POA: Diagnosis not present

## 2018-02-16 DIAGNOSIS — J441 Chronic obstructive pulmonary disease with (acute) exacerbation: Secondary | ICD-10-CM | POA: Diagnosis not present

## 2018-02-16 DIAGNOSIS — E1142 Type 2 diabetes mellitus with diabetic polyneuropathy: Secondary | ICD-10-CM | POA: Diagnosis not present

## 2018-02-16 DIAGNOSIS — I2510 Atherosclerotic heart disease of native coronary artery without angina pectoris: Secondary | ICD-10-CM | POA: Diagnosis not present

## 2018-02-16 DIAGNOSIS — M1991 Primary osteoarthritis, unspecified site: Secondary | ICD-10-CM | POA: Diagnosis not present

## 2018-02-16 DIAGNOSIS — Z7901 Long term (current) use of anticoagulants: Secondary | ICD-10-CM | POA: Diagnosis not present

## 2018-02-16 DIAGNOSIS — Z9114 Patient's other noncompliance with medication regimen: Secondary | ICD-10-CM | POA: Diagnosis not present

## 2018-02-18 DIAGNOSIS — Z710 Person encountering health services to consult on behalf of another person: Secondary | ICD-10-CM | POA: Diagnosis not present

## 2018-03-14 DIAGNOSIS — E1140 Type 2 diabetes mellitus with diabetic neuropathy, unspecified: Secondary | ICD-10-CM | POA: Diagnosis not present

## 2018-03-14 DIAGNOSIS — E782 Mixed hyperlipidemia: Secondary | ICD-10-CM | POA: Diagnosis not present

## 2018-03-16 DIAGNOSIS — I2510 Atherosclerotic heart disease of native coronary artery without angina pectoris: Secondary | ICD-10-CM | POA: Diagnosis not present

## 2018-03-16 DIAGNOSIS — I5020 Unspecified systolic (congestive) heart failure: Secondary | ICD-10-CM | POA: Diagnosis not present

## 2018-03-16 DIAGNOSIS — E1140 Type 2 diabetes mellitus with diabetic neuropathy, unspecified: Secondary | ICD-10-CM | POA: Diagnosis not present

## 2018-03-16 DIAGNOSIS — I482 Chronic atrial fibrillation: Secondary | ICD-10-CM | POA: Diagnosis not present

## 2018-03-16 DIAGNOSIS — D509 Iron deficiency anemia, unspecified: Secondary | ICD-10-CM | POA: Diagnosis not present

## 2018-03-31 ENCOUNTER — Other Ambulatory Visit: Payer: Self-pay

## 2018-03-31 NOTE — Patient Outreach (Signed)
[facility]) Care Management  03/31/2018  Mary [name] 1951-07-15 [id number]   Medication Adherence call to Mrs : [name] patient did not answer : patient is due on Atorvasattin 40 mg [name] is showing past due under [place].    [place] Management Direct Dial [phone]  Fax [phone] [email or web address]

## 2018-04-06 ENCOUNTER — Encounter (HOSPITAL_COMMUNITY): Payer: Self-pay | Admitting: Emergency Medicine

## 2018-04-06 ENCOUNTER — Emergency Department (HOSPITAL_COMMUNITY): Payer: Medicare Other

## 2018-04-06 ENCOUNTER — Inpatient Hospital Stay (HOSPITAL_COMMUNITY)
Admission: EM | Admit: 2018-04-06 | Discharge: 2018-04-08 | DRG: 871 | Disposition: A | Discharge status: Home Health | Payer: Medicare Other | Attending: Internal Medicine | Admitting: Internal Medicine

## 2018-04-06 ENCOUNTER — Other Ambulatory Visit: Payer: Self-pay

## 2018-04-06 DIAGNOSIS — Z91048 Other nonmedicinal substance allergy status: Secondary | ICD-10-CM

## 2018-04-06 DIAGNOSIS — G9341 Metabolic encephalopathy: Secondary | ICD-10-CM | POA: Diagnosis not present

## 2018-04-06 DIAGNOSIS — I4891 Unspecified atrial fibrillation: Secondary | ICD-10-CM | POA: Diagnosis not present

## 2018-04-06 DIAGNOSIS — E119 Type 2 diabetes mellitus without complications: Secondary | ICD-10-CM

## 2018-04-06 DIAGNOSIS — I2510 Atherosclerotic heart disease of native coronary artery without angina pectoris: Secondary | ICD-10-CM | POA: Diagnosis present

## 2018-04-06 DIAGNOSIS — M479 Spondylosis, unspecified: Secondary | ICD-10-CM | POA: Diagnosis present

## 2018-04-06 DIAGNOSIS — I4510 Unspecified right bundle-branch block: Secondary | ICD-10-CM | POA: Diagnosis not present

## 2018-04-06 DIAGNOSIS — I5032 Chronic diastolic (congestive) heart failure: Secondary | ICD-10-CM | POA: Diagnosis not present

## 2018-04-06 DIAGNOSIS — Z888 Allergy status to other drugs, medicaments and biological substances status: Secondary | ICD-10-CM | POA: Diagnosis not present

## 2018-04-06 DIAGNOSIS — Z7951 Long term (current) use of inhaled steroids: Secondary | ICD-10-CM

## 2018-04-06 DIAGNOSIS — G894 Chronic pain syndrome: Secondary | ICD-10-CM | POA: Diagnosis present

## 2018-04-06 DIAGNOSIS — M1990 Unspecified osteoarthritis, unspecified site: Secondary | ICD-10-CM | POA: Diagnosis present

## 2018-04-06 DIAGNOSIS — S069X9A Unspecified intracranial injury with loss of consciousness of unspecified duration, initial encounter: Secondary | ICD-10-CM | POA: Diagnosis not present

## 2018-04-06 DIAGNOSIS — R410 Disorientation, unspecified: Secondary | ICD-10-CM | POA: Diagnosis not present

## 2018-04-06 DIAGNOSIS — E11628 Type 2 diabetes mellitus with other skin complications: Secondary | ICD-10-CM | POA: Diagnosis present

## 2018-04-06 DIAGNOSIS — E785 Hyperlipidemia, unspecified: Secondary | ICD-10-CM | POA: Diagnosis present

## 2018-04-06 DIAGNOSIS — A419 Sepsis, unspecified organism: Secondary | ICD-10-CM | POA: Diagnosis not present

## 2018-04-06 DIAGNOSIS — Z951 Presence of aortocoronary bypass graft: Secondary | ICD-10-CM | POA: Diagnosis not present

## 2018-04-06 DIAGNOSIS — E1141 Type 2 diabetes mellitus with diabetic mononeuropathy: Secondary | ICD-10-CM | POA: Diagnosis not present

## 2018-04-06 DIAGNOSIS — F419 Anxiety disorder, unspecified: Secondary | ICD-10-CM | POA: Diagnosis present

## 2018-04-06 DIAGNOSIS — L03115 Cellulitis of right lower limb: Secondary | ICD-10-CM | POA: Diagnosis present

## 2018-04-06 DIAGNOSIS — N179 Acute kidney failure, unspecified: Secondary | ICD-10-CM | POA: Diagnosis not present

## 2018-04-06 DIAGNOSIS — Z7901 Long term (current) use of anticoagulants: Secondary | ICD-10-CM | POA: Diagnosis not present

## 2018-04-06 DIAGNOSIS — I110 Hypertensive heart disease with heart failure: Secondary | ICD-10-CM | POA: Diagnosis not present

## 2018-04-06 DIAGNOSIS — R4182 Altered mental status, unspecified: Secondary | ICD-10-CM | POA: Diagnosis present

## 2018-04-06 DIAGNOSIS — J449 Chronic obstructive pulmonary disease, unspecified: Secondary | ICD-10-CM | POA: Diagnosis not present

## 2018-04-06 DIAGNOSIS — Z7982 Long term (current) use of aspirin: Secondary | ICD-10-CM

## 2018-04-06 DIAGNOSIS — R296 Repeated falls: Secondary | ICD-10-CM | POA: Diagnosis not present

## 2018-04-06 DIAGNOSIS — Z91040 Latex allergy status: Secondary | ICD-10-CM

## 2018-04-06 DIAGNOSIS — Z87891 Personal history of nicotine dependence: Secondary | ICD-10-CM

## 2018-04-06 DIAGNOSIS — S99921A Unspecified injury of right foot, initial encounter: Secondary | ICD-10-CM | POA: Diagnosis not present

## 2018-04-06 DIAGNOSIS — E1140 Type 2 diabetes mellitus with diabetic neuropathy, unspecified: Secondary | ICD-10-CM | POA: Diagnosis not present

## 2018-04-06 DIAGNOSIS — W06XXXA Fall from bed, initial encounter: Secondary | ICD-10-CM | POA: Diagnosis present

## 2018-04-06 DIAGNOSIS — L03119 Cellulitis of unspecified part of limb: Secondary | ICD-10-CM

## 2018-04-06 DIAGNOSIS — Z79899 Other long term (current) drug therapy: Secondary | ICD-10-CM

## 2018-04-06 DIAGNOSIS — Z9114 Patient's other noncompliance with medication regimen: Secondary | ICD-10-CM | POA: Diagnosis not present

## 2018-04-06 DIAGNOSIS — R6520 Severe sepsis without septic shock: Secondary | ICD-10-CM | POA: Diagnosis present

## 2018-04-06 DIAGNOSIS — G8929 Other chronic pain: Secondary | ICD-10-CM | POA: Diagnosis present

## 2018-04-06 DIAGNOSIS — Z7984 Long term (current) use of oral hypoglycemic drugs: Secondary | ICD-10-CM

## 2018-04-06 LAB — URINALYSIS, ROUTINE W REFLEX MICROSCOPIC
BILIRUBIN URINE: NEGATIVE
Glucose, UA: NEGATIVE mg/dL
HGB URINE DIPSTICK: NEGATIVE
Ketones, ur: NEGATIVE mg/dL
LEUKOCYTES UA: NEGATIVE
Nitrite: NEGATIVE
PROTEIN: NEGATIVE mg/dL
SPECIFIC GRAVITY, URINE: 1.008 (ref 1.005–1.030)
pH: 6 (ref 5.0–8.0)

## 2018-04-06 LAB — HEMOGLOBIN A1C
Hgb A1c MFr Bld: 6.7 % — ABNORMAL HIGH (ref 4.8–5.6)
Mean Plasma Glucose: 145.59 mg/dL

## 2018-04-06 LAB — CBC WITH DIFFERENTIAL/PLATELET
Basophils Absolute: 0 10*3/uL (ref 0.0–0.1)
Basophils Relative: 0 %
EOS PCT: 0 %
Eosinophils Absolute: 0 10*3/uL (ref 0.0–0.7)
HCT: 31.1 % — ABNORMAL LOW (ref 36.0–46.0)
Hemoglobin: 10.2 g/dL — ABNORMAL LOW (ref 12.0–15.0)
LYMPHS ABS: 1.1 10*3/uL (ref 0.7–4.0)
LYMPHS PCT: 11 %
MCH: 29.3 pg (ref 26.0–34.0)
MCHC: 32.8 g/dL (ref 30.0–36.0)
MCV: 89.4 fL (ref 78.0–100.0)
MONO ABS: 1.1 10*3/uL — AB (ref 0.1–1.0)
Monocytes Relative: 12 %
Neutro Abs: 7.3 10*3/uL (ref 1.7–7.7)
Neutrophils Relative %: 77 %
PLATELETS: 140 10*3/uL — AB (ref 150–400)
RBC: 3.48 MIL/uL — ABNORMAL LOW (ref 3.87–5.11)
RDW: 15.8 % — AB (ref 11.5–15.5)
WBC: 9.5 10*3/uL (ref 4.0–10.5)

## 2018-04-06 LAB — COMPREHENSIVE METABOLIC PANEL
ALT: 24 U/L (ref 14–54)
AST: 34 U/L (ref 15–41)
Albumin: 4.2 g/dL (ref 3.5–5.0)
Alkaline Phosphatase: 59 U/L (ref 38–126)
Anion gap: 11 (ref 5–15)
BUN: 31 mg/dL — ABNORMAL HIGH (ref 6–20)
CHLORIDE: 89 mmol/L — AB (ref 101–111)
CO2: 28 mmol/L (ref 22–32)
CREATININE: 1.18 mg/dL — AB (ref 0.44–1.00)
Calcium: 9.8 mg/dL (ref 8.9–10.3)
GFR calc Af Amer: 54 mL/min — ABNORMAL LOW (ref >60)
GFR, EST NON AFRICAN AMERICAN: 46 mL/min — AB (ref >60)
Glucose, Bld: 184 mg/dL — ABNORMAL HIGH (ref 65–99)
POTASSIUM: 5.5 mmol/L — AB (ref 3.5–5.1)
Sodium: 128 mmol/L — ABNORMAL LOW (ref 135–145)
Total Bilirubin: 0.9 mg/dL (ref 0.3–1.2)
Total Protein: 7.2 g/dL (ref 6.5–8.1)

## 2018-04-06 LAB — GLUCOSE, CAPILLARY: Glucose-Capillary: 108 mg/dL — ABNORMAL HIGH (ref 65–99)

## 2018-04-06 LAB — BLOOD GAS, VENOUS
Acid-Base Excess: 4.4 mmol/L — ABNORMAL HIGH (ref 0.0–2.0)
BICARBONATE: 27.1 mmol/L (ref 20.0–28.0)
FIO2: 21
O2 SAT: 64.8 %
PO2 VEN: 38.4 mmHg (ref 32.0–45.0)
pCO2, Ven: 53.7 mmHg (ref 44.0–60.0)
pH, Ven: 7.359 (ref 7.250–7.430)

## 2018-04-06 LAB — RAPID URINE DRUG SCREEN, HOSP PERFORMED
Amphetamines: NOT DETECTED
Barbiturates: NOT DETECTED
Benzodiazepines: POSITIVE — AB
Cocaine: NOT DETECTED
OPIATES: NOT DETECTED
TETRAHYDROCANNABINOL: NOT DETECTED

## 2018-04-06 LAB — CBG MONITORING, ED
GLUCOSE-CAPILLARY: 151 mg/dL — AB (ref 65–99)
Glucose-Capillary: 204 mg/dL — ABNORMAL HIGH (ref 65–99)

## 2018-04-06 LAB — I-STAT CG4 LACTIC ACID, ED
Lactic Acid, Venous: 1.19 mmol/L (ref 0.5–1.9)
Lactic Acid, Venous: 0.77 mmol/L (ref 0.5–1.9)

## 2018-04-06 MED ORDER — MAGNESIUM OXIDE 250 MG PO TABS
250.0000 mg | ORAL_TABLET | Freq: Every day | ORAL | Status: DC
  Filled 2018-04-06 (×3): qty 1

## 2018-04-06 MED ORDER — DULOXETINE HCL 30 MG PO CPEP
30.0000 mg | ORAL_CAPSULE | Freq: Every day | ORAL | Status: DC
  Administered 2018-04-07 – 2018-04-08 (×2): 30 mg via ORAL
  Filled 2018-04-06 (×2): qty 1

## 2018-04-06 MED ORDER — MOMETASONE FURO-FORMOTEROL FUM 200-5 MCG/ACT IN AERO
2.0000 | INHALATION_SPRAY | Freq: Two times a day (BID) | RESPIRATORY_TRACT | Status: DC
  Administered 2018-04-06 – 2018-04-08 (×4): 2 via RESPIRATORY_TRACT
  Filled 2018-04-06 (×2): qty 8.8

## 2018-04-06 MED ORDER — LEVALBUTEROL HCL 0.63 MG/3ML IN NEBU
0.6300 mg | INHALATION_SOLUTION | Freq: Four times a day (QID) | RESPIRATORY_TRACT | Status: DC
  Administered 2018-04-06 – 2018-04-07 (×4): 0.63 mg via RESPIRATORY_TRACT
  Filled 2018-04-06 (×5): qty 3

## 2018-04-06 MED ORDER — PIPERACILLIN-TAZOBACTAM 3.375 G IVPB 30 MIN
3.3750 g | Freq: Once | INTRAVENOUS | Status: AC
  Administered 2018-04-06: 3.375 g via INTRAVENOUS
  Filled 2018-04-06: qty 50

## 2018-04-06 MED ORDER — ACETAMINOPHEN 500 MG PO TABS: 500.0000 mg | ORAL_TABLET | Freq: Four times a day (QID) | ORAL | Status: DC | PRN

## 2018-04-06 MED ORDER — VANCOMYCIN HCL 500 MG IV SOLR
500.0000 mg | INTRAVENOUS | Status: DC
  Administered 2018-04-07: 500 mg via INTRAVENOUS
  Filled 2018-04-06 (×4): qty 500

## 2018-04-06 MED ORDER — SODIUM CHLORIDE 0.9 % IV SOLN
INTRAVENOUS | Status: AC
  Administered 2018-04-06 – 2018-04-07 (×2): via INTRAVENOUS

## 2018-04-06 MED ORDER — APIXABAN 5 MG PO TABS
5.0000 mg | ORAL_TABLET | Freq: Two times a day (BID) | ORAL | Status: DC
  Administered 2018-04-06 – 2018-04-08 (×4): 5 mg via ORAL
  Filled 2018-04-06 (×4): qty 1

## 2018-04-06 MED ORDER — DILTIAZEM LOAD VIA INFUSION
10.0000 mg | Freq: Once | INTRAVENOUS | Status: AC
  Administered 2018-04-06: 10 mg via INTRAVENOUS
  Filled 2018-04-06: qty 10

## 2018-04-06 MED ORDER — SODIUM CHLORIDE 0.9 % IV BOLUS (SEPSIS)
250.0000 mL | Freq: Once | INTRAVENOUS | Status: AC
  Administered 2018-04-06: 250 mL via INTRAVENOUS

## 2018-04-06 MED ORDER — ATORVASTATIN CALCIUM 40 MG PO TABS
40.0000 mg | ORAL_TABLET | Freq: Every day | ORAL | Status: DC
  Administered 2018-04-07 – 2018-04-08 (×2): 40 mg via ORAL
  Filled 2018-04-06 (×2): qty 1

## 2018-04-06 MED ORDER — SODIUM CHLORIDE 0.9 % IV BOLUS (SEPSIS)
500.0000 mL | Freq: Once | INTRAVENOUS | Status: AC
  Administered 2018-04-06: 500 mL via INTRAVENOUS

## 2018-04-06 MED ORDER — MAGNESIUM OXIDE 400 (241.3 MG) MG PO TABS
200.0000 mg | ORAL_TABLET | Freq: Every day | ORAL | Status: DC
  Administered 2018-04-06 – 2018-04-08 (×3): 200 mg via ORAL
  Filled 2018-04-06 (×2): qty 1

## 2018-04-06 MED ORDER — DILTIAZEM HCL-DEXTROSE 100-5 MG/100ML-% IV SOLN (PREMIX)
5.0000 mg/h | INTRAVENOUS | Status: DC
  Administered 2018-04-06: 5 mg/h via INTRAVENOUS
  Filled 2018-04-06: qty 100

## 2018-04-06 MED ORDER — INSULIN ASPART 100 UNIT/ML ~~LOC~~ SOLN
0.0000 [IU] | Freq: Three times a day (TID) | SUBCUTANEOUS | Status: DC
  Administered 2018-04-06 – 2018-04-07 (×2): 2 [IU] via SUBCUTANEOUS
  Administered 2018-04-07: 3 [IU] via SUBCUTANEOUS
  Administered 2018-04-08: 5 [IU] via SUBCUTANEOUS
  Administered 2018-04-08: 1 [IU] via SUBCUTANEOUS
  Filled 2018-04-06: qty 1

## 2018-04-06 MED ORDER — TRAMADOL HCL 50 MG PO TABS
50.0000 mg | ORAL_TABLET | Freq: Four times a day (QID) | ORAL | Status: AC | PRN
  Administered 2018-04-06 – 2018-04-07 (×2): 50 mg via ORAL
  Filled 2018-04-06 (×2): qty 1

## 2018-04-06 MED ORDER — LISINOPRIL 5 MG PO TABS
2.5000 mg | ORAL_TABLET | Freq: Every day | ORAL | Status: DC
  Administered 2018-04-07: 2.5 mg via ORAL
  Filled 2018-04-06: qty 1

## 2018-04-06 MED ORDER — VANCOMYCIN HCL IN DEXTROSE 1-5 GM/200ML-% IV SOLN
1000.0000 mg | Freq: Once | INTRAVENOUS | Status: AC
  Administered 2018-04-06: 1000 mg via INTRAVENOUS
  Filled 2018-04-06: qty 200

## 2018-04-06 MED ORDER — GABAPENTIN 100 MG PO CAPS
100.0000 mg | ORAL_CAPSULE | Freq: Three times a day (TID) | ORAL | Status: DC | PRN
  Administered 2018-04-06: 100 mg via ORAL
  Filled 2018-04-06: qty 1

## 2018-04-06 MED ORDER — DILTIAZEM HCL ER COATED BEADS 120 MG PO CP24
120.0000 mg | ORAL_CAPSULE | Freq: Every day | ORAL | Status: DC
  Administered 2018-04-06 – 2018-04-08 (×3): 120 mg via ORAL
  Filled 2018-04-06 (×5): qty 1

## 2018-04-06 MED ORDER — PIPERACILLIN-TAZOBACTAM 3.375 G IVPB
3.3750 g | Freq: Three times a day (TID) | INTRAVENOUS | Status: DC
  Administered 2018-04-06 – 2018-04-08 (×5): 3.375 g via INTRAVENOUS
  Filled 2018-04-06 (×6): qty 50

## 2018-04-06 MED ORDER — PANTOPRAZOLE SODIUM 40 MG PO TBEC
40.0000 mg | DELAYED_RELEASE_TABLET | Freq: Every day | ORAL | Status: DC
  Administered 2018-04-06 – 2018-04-08 (×3): 40 mg via ORAL
  Filled 2018-04-06 (×3): qty 1

## 2018-04-06 MED ORDER — MONTELUKAST SODIUM 10 MG PO TABS
10.0000 mg | ORAL_TABLET | Freq: Every day | ORAL | Status: DC
  Administered 2018-04-07 – 2018-04-08 (×2): 10 mg via ORAL
  Filled 2018-04-06 (×2): qty 1

## 2018-04-06 MED ORDER — SODIUM CHLORIDE 0.9 % IV BOLUS (SEPSIS)
1000.0000 mL | Freq: Once | INTRAVENOUS | Status: AC
  Administered 2018-04-06: 1000 mL via INTRAVENOUS

## 2018-04-06 MED ORDER — ACETAMINOPHEN 325 MG PO TABS
650.0000 mg | ORAL_TABLET | Freq: Once | ORAL | Status: AC
  Administered 2018-04-06: 650 mg via ORAL
  Filled 2018-04-06: qty 2

## 2018-04-06 NOTE — ED Notes (Signed)
Report given to 300 RN. 

## 2018-04-06 NOTE — ED Provider Notes (Signed)
[facility] EMERGENCY DEPARTMENT Provider Note   CSN: [id number] Arrival date & time: 04/06/18  1215     History   Chief Complaint Chief Complaint  Patient presents with  . Foot Pain    HPI JAHLIA OMURA is a 67 y.o. female. Level 5 caveat secondary to confusion HPI  67 yo female multiple health problems from home presents today complaining of fall 2 days ago.  Patient fell off bed and slipped.  Fell to floor on bottom and able - patient states has fallen multiple times.  Friend states she fell today , but patient gives cnflicting information.  States she is unable to walk but states she ate by going to kitchen today (she does have a wheelchair). pmd Wende Neighbors  Past Medical History:  Diagnosis Date  . Anxiety   . Arthritis   . Atrial fibrillation ([place])   . Bronchial asthma   . CHF (congestive heart failure) ([place])   . Chronic pain    Bacl pain, Disc L5-S1- Dr. Joya Salm in [place]  . COPD (chronic obstructive pulmonary disease) ([place])   . DDD (degenerative disc disease)    Radicular symptoms  . Diabetes mellitus   . History of stress test 06/2011   Abnormal myocardial perfusion study.  Marland Kitchen Hx of echocardiogram    Was interpreted by Dr Doylene Canard that showed an Ef in the 50-60% range with grade 1 diastolic dysfunction. she had moderate MR, biatrial enlargement, moderate TR and at that time estimated RV systolic pressure was 43 mm.  . Hyperlipidemia   . Hypertension   . Neuropathy   . Shortness of breath   . Tachycardia     Patient Active Problem List   Diagnosis Date Noted  . Acute on chronic diastolic CHF (congestive heart failure) ([place]) 01/10/2018  . Sepsis due to undetermined organism ([place]) 01/08/2018  . AKI (acute kidney injury) ([place]) 01/08/2018  . Acute metabolic encephalopathy 78/29/5621  . Acute respiratory failure with hypoxia and hypercarbia ([place]) 01/08/2018  . Transaminasemia 01/08/2018  . Acute respiratory failure with hypercapnia ([place]) 01/07/2018  . Hyponatremia  01/07/2018  . Anemia 01/07/2018  . Lobar pneumonia ([place]) 11/16/2017  . Chronic diastolic CHF (congestive heart failure) ([place]) 11/16/2017  . Acute respiratory failure with hypoxia ([place]) 11/15/2017  . Malnutrition of moderate degree 02/20/2017  . Acute on chronic respiratory failure with hypoxia ([place]) 02/18/2017  . Hypomagnesemia 02/18/2017  . COPD with acute exacerbation ([place]) 11/04/2016  . Left carotid bruit 09/13/2015  . Noncompliance with medications 03/19/2015  . Gastroenteritis 03/19/2015  . Seasonal allergies 01/25/2015  . Chest pain 05/16/2014  . HTN (hypertension) 05/16/2014  . PSVT (paroxysmal supraventricular tachycardia) ([place]) 05/16/2014  . Acute diastolic CHF (congestive heart failure) ([place]) 05/15/2014  . CAD (coronary artery disease) 05/15/2014  . RBBB 04/25/2014  . Benign skin lesion 03/07/2014  . 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 Atrial fibrillation with rapid ventricular response ([place]) 01/06/2014  . Leukocytosis 01/06/2014  . Fall at home 09/18/2013  . Hypotension 09/18/2013  . Recurrent falls 09/18/2013  . Tinnitus of left ear 02/19/2013  . Altered mental status 12/06/2012  . Diastolic congestive heart failure ([place]) 09/10/2012  . Atrial fibrillation ([place]) 09/10/2012  . DDD (degenerative disc disease), lumbar 08/26/2012  . Atypical mole 04/14/2012  . Seborrheic keratosis 04/14/2012  . Depression 12/09/2011  . COPD (chronic obstructive pulmonary disease) ([place]) 06/27/2011  . Anxiety 06/27/2011  . Chronic pain 05/27/2011  . Diabetes mellitus ([place]) 05/26/2011  . Hyperlipidemia 05/26/2011  Past Surgical History:  Procedure Laterality Date  . ABDOMINAL HYSTERECTOMY     partial  . Breast cyst removed    . CARPAL TUNNEL RELEASE     x 2  . CORONARY ARTERY BYPASS GRAFT N/A 01/09/2014   Procedure: CORONARY ARTERY BYPASS GRAFTING (CABG) x 3 using endoscopically harvested right saphenous vein and left internal mammary artery and closure of left atrial appendage;  Surgeon:  Ivin Poot, MD;  Location: [place];  Service: Open Heart Surgery;  Laterality: N/A;  patient has preop IA BP   . INTRAOPERATIVE TRANSESOPHAGEAL ECHOCARDIOGRAM N/A 01/09/2014   Procedure: INTRAOPERATIVE TRANSESOPHAGEAL ECHOCARDIOGRAM;  Surgeon: Ivin Poot, MD;  Location: [place] [place];  Service: Open Heart Surgery;  Laterality: N/A;  . LEFT HEART CATHETERIZATION WITH CORONARY ANGIOGRAM N/A 01/07/2014   Procedure: LEFT HEART CATHETERIZATION WITH CORONARY ANGIOGRAM;  Surgeon: Lorretta Harp, MD;  Location: [facility] CATH LAB;  Service: Cardiovascular;  Laterality: N/A;  . TONSILLECTOMY    . TUBAL LIGATION       OB History    Gravida  1   Para  1   Term  1   Preterm      AB      Living  0     SAB      TAB      Ectopic      Multiple      Live Births               Home Medications    Prior to Admission medications   Medication Sig Start Date End Date Taking? Authorizing Provider  acetaminophen (TYLENOL) 500 MG tablet Take 500 mg by mouth every 6 (six) hours as needed for mild pain.    [provider]  acetylcysteine (MUCOMYST) 20 % nebulizer solution Take 3 mLs by nebulization every 6 (six) hours. 02/24/17   Eber Jones, MD  albuterol (PROVENTIL HFA;VENTOLIN HFA) 108 [phone] Base) MCG/ACT inhaler Inhale 2 puffs into the lungs every 6 (six) hours as needed for shortness of breath.     [provider]  ALPRAZolam Duanne Moron) 1 MG tablet Take 1 mg by mouth 4 (four) times daily.  03/10/16   [provider]  apixaban (ELIQUIS) 5 MG TABS tablet Take 1 tablet (5 mg total) by mouth 2 (two) times daily. Take place of Pradaxa 11/01/17   Troy Sine, MD  aspirin EC 81 MG tablet Take 81 mg by mouth daily.    [provider]  atorvastatin (LIPITOR) 40 MG tablet Take 1 tablet (40 mg total) by mouth daily. NEED OV. 02/10/18   Troy Sine, MD  diltiazem (CARDIZEM CD) 120 MG 24 hr capsule Take 1 capsule (120 mg total) by mouth daily. 01/12/18 04/12/18   Isaac Bliss, Rayford Halsted, MD  DULoxetine (CYMBALTA) 30 MG capsule Take 1 capsule by mouth daily. 11/01/17   [provider]  fluticasone (FLONASE) 50 MCG/ACT nasal spray Place 2 sprays into both nostrils daily. 01/25/15   Alycia Rossetti, MD  furosemide (LASIX) 40 MG tablet Take 1 tablet (40 mg total) by mouth daily. KEEP OV. 02/10/18   Troy Sine, MD  glipiZIDE (GLUCOTROL XL) 2.5 MG 24 hr tablet 2.5 mg daily.  02/28/16   [provider]  Iron-FA-B Cmp-C-Biot-Probiotic (FUSION PLUS PO) Take 1 tablet by mouth daily.    [provider]  JANUVIA 100 MG tablet TAKE ONE TABLET BY MOUTH DAILY. 07/19/15   Alycia Rossetti, MD  levalbuterol Penne Lash)  0.63 MG/3ML nebulizer solution Take 3 mLs (0.63 mg total) by nebulization every 6 (six) hours. 02/24/17   Eber Jones, MD  lisinopril (PRINIVIL,ZESTRIL) 2.5 MG tablet TAKE ONE TABLET BY MOUTH DAILY. 08/23/17   Troy Sine, MD  Magnesium Oxide 250 MG TABS Take 250 mg by mouth daily.     [provider]  metFORMIN (GLUCOPHAGE) 850 MG tablet TAKE 1 TABLET BY MOUTH TWICE DAILY WITH MEALS. 07/19/15   Alycia Rossetti, MD  metoprolol tartrate (LOPRESSOR) 50 MG tablet TAKE 2 TABLETS BY MOUTH IN THE MORNING AND 1 AND 1/2 TABLETS AT BEDTIME. 08/23/17   Troy Sine, MD  montelukast (SINGULAIR) 10 MG tablet Take 1 tablet by mouth daily. 01/21/17   [provider]  Multiple Vitamin (MULTIVITAMIN) capsule Take 1 capsule by mouth daily.    [provider]  potassium chloride SA (K-DUR,KLOR-CON) 20 MEQ tablet Take 1 tablet (20 mEq total) by mouth daily. 11/06/16   Thurnell Lose, MD  SYMBICORT 160-4.5 MCG/ACT inhaler INHALE 2 PUFFS INTO THE LUNGS TWICE DAILY. RINSE MOUTH AFTER USE. 06/04/15   Alycia Rossetti, MD    Family History Family History  Problem Relation Age of Onset  . Diabetes Mother   . Heart disease Mother   . Diabetes Sister   . Hypertension Sister   . Hyperlipidemia Sister   .  Diabetes Brother   . Heart disease Brother   . Diabetes Brother   . Heart disease Brother     Social History Social History   Tobacco Use  . Smoking status: Former Smoker    Packs/day: 1.50    Years: 30.00    Pack years: 45.00    Types: Cigarettes    Last attempt to quit: 05/26/2013    Years since quitting: 4.8  . Smokeless tobacco: Never Used  . Tobacco comment: smells of heavy smoke 02/18/17  Substance Use Topics  . Alcohol use: No  . Drug use: No     Allergies   Adhesive [tape]; Latex; and Zocor [simvastatin - high dose]   Review of Systems Review of Systems  All other systems reviewed and are negative.    Physical Exam Updated Vital Signs BP 119/81 (BP Location: Right Arm)   Pulse (!) 124   Temp 99.8 F (37.7 C) (Oral)   Resp 20   Ht 1.702 m (5\' 7" )   Wt 53.1 kg (117 lb)   SpO2 94%   BMI 18.32 kg/m   Physical Exam  Constitutional: She appears well-developed and well-nourished. No distress.  Warm to touch  HENT:  Head: Normocephalic and atraumatic.  Right Ear: External ear normal.  Left Ear: External ear normal.  Nose: Nose normal.  Mouth/Throat: Oropharynx is clear and moist.  Eyes: Pupils are equal, round, and reactive to light. EOM are normal.  Neck: Normal range of motion. Neck supple.  Cardiovascular: An irregularly irregular rhythm present. Tachycardia present.  Pulmonary/Chest: Effort normal and breath sounds normal.  Abdominal: Soft. Bowel sounds are normal.  Musculoskeletal: Normal range of motion.  Right foot with erythema, swelling and ttp  Neurological: She is alert.  Oriented to person, mixes up year, no focal symptoms but is inconsistent and appears confused  Skin: Skin is warm and dry. Capillary refill takes less than 2 seconds.  Nursing note and vitals reviewed.    ED Treatments / Results  Labs (all labs ordered are listed, but only abnormal results are displayed) Labs Reviewed  CBG MONITORING, ED - Abnormal;  Notable for the  following components:      Result Value   Glucose-Capillary 204 (*)    All other components within normal limits    EKG EKG Interpretation  Date/Time:  Wednesday April 06 2018 13:48:43 EDT Ventricular Rate:  127 PR Interval:    QRS Duration: 138 QT Interval:  343 QTC Calculation: 499 R Axis:   147 Text Interpretation:  Atrial fibrillation with rapid ventricular response Right bundle branch block Confirmed by Pattricia Boss [phone]) on 04/06/2018 1:57:12 PM   Radiology No results found.  Procedures Procedures (including critical care time)  Medications Ordered in ED Medications - No data to display   Initial Impression / Assessment and Plan / ED Course  I have reviewed the triage vital signs and the nursing notes.  Pertinent labs & imaging results that were available during my care of the patient were reviewed by me and considered in my medical decision making (see chart for details).    1:58 PM Patient's fever reported after rectal temp ordered.  Fluid bolus, sepsis, antibiotics ordered   Vitals:   04/06/18 1351 04/06/18 1530  BP: 134/85 118/84  Pulse: (!) 130 (!) 124  Resp: 18 18  Temp: (!) 101.3 F (38.5 C)   SpO2: 95% 97%     1- cellulitis right foot with sepsis criteria of tachycardia, fever,and site, but no hypotension. Fluid bolus ordered and will stop full fluid bolus given normal lactic acid.  Zosyn and vanc given for broad spectrum coverage 2-sepsis 3- a fib rvr-po cardizem given- heart rate now at 104 4-aki 5- encephalopathy -? Secondary to infection, vs meds, vs dementia  CRITICAL CARE Performed by: Pattricia Boss Total critical care time: 45 minutes Critical care time was exclusive of separately billable procedures and treating other patients. Critical care was necessary to treat or prevent imminent or life-threatening deterioration. Critical care was time spent personally by me on the following activities: development of treatment plan with patient  and/or surrogate as well as nursing, discussions with consultants, evaluation of patient's response to treatment, examination of patient, obtaining history from patient or surrogate, ordering and performing treatments and interventions, ordering and review of laboratory studies, ordering and review of radiographic studies, pulse oximetry and re-evaluation of patient's condition.    Final Clinical Impressions(s) / ED Diagnoses   Final diagnoses:  Cellulitis of right foot  Sepsis, due to unspecified organism [facility])  Atrial fibrillation with RVR [facility])    ED Discharge Orders    None       Pattricia Boss, MD 04/06/18 1642

## 2018-04-06 NOTE — Progress Notes (Signed)
Pharmacy Antibiotic Note  Mary [name] is a 67 y.o. female admitted on 04/06/2018 with cellulitis.  Pharmacy has been consulted for Vancomycin and Zosyn dosing.  Plan: Vancomycin 1000 mg IV x 1 d0se Vancomycin 500 mg IV every 24 hours.  Goal trough 10-15 mcg/mL. Zosyn 3.375g IV q8h (4 hour infusion).  Monitor labs, c/s, and vanco trough as indicated  Height: 5\' 7"  (170.2 cm) Weight: 117 lb (53.1 kg) IBW/kg (Calculated) : 61.6  Temp (24hrs), Avg:100.6 F (38.1 C), Min:99.8 F (37.7 C), Max:101.3 F (38.5 C)  Recent Labs  Lab 04/06/18 1338  WBC 9.5  CREATININE 1.18*    Estimated Creatinine Clearance: 38.3 mL/min (A) (by C-G formula based on SCr of 1.18 mg/dL (H)).    Allergies  Allergen Reactions  . Adhesive [Tape] Other (See Comments)    Tears skin off.   . Latex Other (See Comments)    In tape, tears skin off.  . Zocor [Simvastatin - High Dose] Nausea And Vomiting    Antimicrobials this admission: Vanco 6/12 >>  Zosyn 6/12 >>   Dose adjustments this admission: N/A  Microbiology results: 6/12 BCx: pending   Thank you for allowing pharmacy to be a part of this patient's care.  Margot Ables, PharmD Clinical Pharmacist 04/06/2018 2:22 PM

## 2018-04-06 NOTE — ED Notes (Signed)
I-stat lactic 1.19

## 2018-04-06 NOTE — ED Triage Notes (Signed)
Pt c/o right anterior foot pain from fall today, pt fell off bed. Mild swelling noted. Nad. A/o

## 2018-04-06 NOTE — H&P (Signed)
History and Physical    Mary [name] CHY:[id number] DOB: 1951/01/01 DOA: 04/06/2018  PCP: Celene Squibb, MD  Patient coming from: Home  Chief Complaint: Confusion and fever  HPI: Mary [name] is a 67 y.o. female with medical history significant of anxiety, congestive heart failure, COPD, A. fib, hypertension presents to the emergency department with right anterior foot pain swelling and redness.  She also noted to have a fever of 102.  She does report she felt a couple of days ago.  She denies any urinary symptoms.  She denies any nausea vomiting or diarrhea.  Patient's foot is mildly swollen and very lightly red.  She was also found to be in A. fib with RVR.  X-rays of the foot have been negative but is very painful to touch superficially on the skin.  She denies rashes anywhere else.  Patient referred for admission for sepsis secondary to cellulitis.  Review of Systems: As per HPI otherwise 10 point review of systems negative.   Past Medical History:  Diagnosis Date  . Anxiety   . Arthritis   . Atrial fibrillation ([place])   . Bronchial asthma   . CHF (congestive heart failure) ([place])   . Chronic pain    Bacl pain, Disc L5-S1- Dr. Joya Salm in [place]  . COPD (chronic obstructive pulmonary disease) ([place])   . DDD (degenerative disc disease)    Radicular symptoms  . Diabetes mellitus   . History of stress test 06/2011   Abnormal myocardial perfusion study.  Marland Kitchen Hx of echocardiogram    Was interpreted by Dr Doylene Canard that showed an Ef in the 50-60% range with grade 1 diastolic dysfunction. she had moderate MR, biatrial enlargement, moderate TR and at that time estimated RV systolic pressure was 43 mm.  . Hyperlipidemia   . Hypertension   . Neuropathy   . Shortness of breath   . Tachycardia     Past Surgical History:  Procedure Laterality Date  . ABDOMINAL HYSTERECTOMY     partial  . Breast cyst removed    . CARPAL TUNNEL RELEASE     x 2  . CORONARY ARTERY BYPASS GRAFT N/A 01/09/2014   Procedure: CORONARY ARTERY BYPASS GRAFTING (CABG) x 3 using endoscopically harvested right saphenous vein and left internal mammary artery and closure of left atrial appendage;  Surgeon: Ivin Poot, MD;  Location: [facility];  Service: Open Heart Surgery;  Laterality: N/A;  patient has preop IA BP   . INTRAOPERATIVE TRANSESOPHAGEAL ECHOCARDIOGRAM N/A 01/09/2014   Procedure: INTRAOPERATIVE TRANSESOPHAGEAL ECHOCARDIOGRAM;  Surgeon: Ivin Poot, MD;  Location: [place];  Service: Open Heart Surgery;  Laterality: N/A;  . LEFT HEART CATHETERIZATION WITH CORONARY ANGIOGRAM N/A 01/07/2014   Procedure: LEFT HEART CATHETERIZATION WITH CORONARY ANGIOGRAM;  Surgeon: Lorretta Harp, MD;  Location: [facility] CATH LAB;  Service: Cardiovascular;  Laterality: N/A;  . TONSILLECTOMY    . TUBAL LIGATION       reports that she quit smoking about 4 years ago. Her smoking use included cigarettes. She has a 45.00 pack-year smoking history. She has never used smokeless tobacco. She reports that she does not drink alcohol or use drugs.  Allergies  Allergen Reactions  . Adhesive [Tape] Other (See Comments)    Tears skin off.   . Latex Other (See Comments)    In tape, tears skin off.  . Zocor [Simvastatin - High Dose] Nausea And Vomiting    Family History  Problem Relation Age of Onset  .  Diabetes Mother   . Heart disease Mother   . Diabetes Sister   . Hypertension Sister   . Hyperlipidemia Sister   . Diabetes Brother   . Heart disease Brother   . Diabetes Brother   . Heart disease Brother     Prior to Admission medications   Medication Sig Start Date End Date Taking? Authorizing Provider  acetaminophen (TYLENOL) 500 MG tablet Take 500 mg by mouth every 6 (six) hours as needed for mild pain.   Yes [provider]  albuterol (PROVENTIL HFA;VENTOLIN HFA) 108 (90 Base) MCG/ACT inhaler Inhale 2 puffs into the lungs every 6 (six) hours as needed for shortness of breath.    Yes [provider]    apixaban (ELIQUIS) 5 MG TABS tablet Take 1 tablet (5 mg total) by mouth 2 (two) times daily. Take place of Pradaxa 11/01/17  Yes Troy Sine, MD  atorvastatin (LIPITOR) 40 MG tablet Take 1 tablet (40 mg total) by mouth daily. NEED OV. 02/10/18  Yes Troy Sine, MD  diltiazem (CARDIZEM CD) 120 MG 24 hr capsule Take 1 capsule (120 mg total) by mouth daily. 01/12/18 04/12/18 Yes Erline Hau, MD  DULoxetine (CYMBALTA) 30 MG capsule Take 1 capsule by mouth daily. 11/01/17  Yes [provider]  furosemide (LASIX) 40 MG tablet Take 1 tablet (40 mg total) by mouth daily. KEEP OV. 02/10/18  Yes Troy Sine, MD  gabapentin (NEURONTIN) 100 MG capsule Take 100 mg by mouth every 8 (eight) hours as needed (back pain).   Yes [provider]  glipiZIDE (GLUCOTROL XL) 2.5 MG 24 hr tablet 2.5 mg daily.  02/28/16  Yes [provider]  Iron-FA-B Cmp-C-Biot-Probiotic (FUSION PLUS PO) Take 1 tablet by mouth daily.   Yes [provider]  JANUVIA 100 MG tablet TAKE ONE TABLET BY MOUTH DAILY. 07/19/15  Yes [place], Modena Nunnery, MD  levalbuterol Penne Lash) 0.63 MG/3ML nebulizer solution Take 3 mLs (0.63 mg total) by nebulization every 6 (six) hours. 02/24/17  Yes Eber Jones, MD  lisinopril (PRINIVIL,ZESTRIL) 2.5 MG tablet TAKE ONE TABLET BY MOUTH DAILY. 08/23/17  Yes Troy Sine, MD  Magnesium Oxide 250 MG TABS Take 250 mg by mouth daily.    Yes [provider]  metFORMIN (GLUCOPHAGE) 850 MG tablet TAKE 1 TABLET BY MOUTH TWICE DAILY WITH MEALS. 07/19/15  Yes [place], Modena Nunnery, MD  metoprolol tartrate (LOPRESSOR) 50 MG tablet TAKE 2 TABLETS BY MOUTH IN THE MORNING AND 1 AND 1/2 TABLETS AT BEDTIME. 08/23/17  Yes Troy Sine, MD  montelukast (SINGULAIR) 10 MG tablet Take 1 tablet by mouth daily. 01/21/17  Yes [provider]  Multiple Vitamin (MULTIVITAMIN) capsule Take 1 capsule by mouth daily.   Yes [provider]  pantoprazole  (PROTONIX) 40 MG tablet Take 40 mg by mouth daily.   Yes [provider]  potassium chloride SA (K-DUR,KLOR-CON) 20 MEQ tablet Take 1 tablet (20 mEq total) by mouth daily. 11/06/16  Yes Thurnell Lose, MD  SYMBICORT 160-4.5 MCG/ACT inhaler INHALE 2 PUFFS INTO THE LUNGS TWICE DAILY. RINSE MOUTH AFTER USE. 06/04/15  Yes Alycia Rossetti, MD    Physical Exam: Vitals:   04/06/18 1226 04/06/18 1323 04/06/18 1351 04/06/18 1530  BP:   134/85 118/84  Pulse:   (!) 130 (!) 124  Resp:   18 18  Temp:   (!) 101.3 F (38.5 C)   TempSrc:   Oral   SpO2:  95%  95% 97%  Weight: 53.1 kg (117 lb)     Height: 5\' 7"  (1.702 m)         Constitutional: NAD, calm, comfortable Vitals:   04/06/18 1226 04/06/18 1323 04/06/18 1351 04/06/18 1530  BP:   134/85 118/84  Pulse:   (!) 130 (!) 124  Resp:   18 18  Temp:   (!) 101.3 F (38.5 C)   TempSrc:   Oral   SpO2:  95% 95% 97%  Weight: 53.1 kg (117 lb)     Height: 5\' 7"  (1.702 m)      Eyes: PERRL, lids and conjunctivae normal ENMT: Mucous membranes are moist. Posterior pharynx clear of any exudate or lesions.Normal dentition.  Neck: normal, supple, no masses, no thyromegaly Respiratory: clear to auscultation bilaterally, no wheezing, no crackles. Normal respiratory effort. No accessory muscle use.  Cardiovascular: Regular rate and rhythm, no murmurs / rubs / gallops. No extremity edema. 2+ pedal pulses. No carotid bruits.  Abdomen: no tenderness, no masses palpated. No hepatosplenomegaly. Bowel sounds positive.  Musculoskeletal: no clubbing / cyanosis. No joint deformity upper and lower extremities. Good ROM, no contractures. Normal muscle tone.  Skin: Mild erythematous rash to right forefoot with some associated swelling without lesions or  ulcers. No induration Neurologic: CN 2-12 grossly intact. Sensation intact, DTR normal. Strength 5/5 in all 4.  Psychiatric: Normal judgment and insight. Alert and oriented x 3. Normal mood.    Labs on  Admission: I have personally reviewed following labs and imaging studies  CBC: Recent Labs  Lab 04/06/18 1338  WBC 9.5  NEUTROABS 7.3  HGB 10.2*  HCT 31.1*  MCV 89.4  PLT 761*   Basic Metabolic Panel: Recent Labs  Lab 04/06/18 1338  NA 128*  K 5.5*  CL 89*  CO2 28  GLUCOSE 184*  BUN 31*  CREATININE 1.18*  CALCIUM 9.8   GFR: Estimated Creatinine Clearance: 38.3 mL/min (A) (by C-G formula based on SCr of 1.18 mg/dL (H)). Liver Function Tests: Recent Labs  Lab 04/06/18 1338  AST 34  ALT 24  ALKPHOS 59  BILITOT 0.9  PROT 7.2  ALBUMIN 4.2   No results for input(s): LIPASE, AMYLASE in the last 168 hours. No results for input(s): AMMONIA in the last 168 hours. Coagulation Profile: No results for input(s): INR, PROTIME in the last 168 hours. Cardiac Enzymes: No results for input(s): CKTOTAL, CKMB, CKMBINDEX, TROPONINI in the last 168 hours. BNP (last 3 results) No results for input(s): PROBNP in the last 8760 hours. HbA1C: No results for input(s): HGBA1C in the last 72 hours. CBG: Recent Labs  Lab 04/06/18 1228  GLUCAP 204*   Lipid Profile: No results for input(s): CHOL, HDL, LDLCALC, TRIG, CHOLHDL, LDLDIRECT in the last 72 hours. Thyroid Function Tests: No results for input(s): TSH, T4TOTAL, FREET4, T3FREE, THYROIDAB in the last 72 hours. Anemia Panel: No results for input(s): VITAMINB12, FOLATE, FERRITIN, TIBC, IRON, RETICCTPCT in the last 72 hours. Urine analysis:    Component Value Date/Time   COLORURINE YELLOW 04/06/2018 1311   APPEARANCEUR CLEAR 04/06/2018 1311   LABSPEC 1.008 04/06/2018 1311   PHURINE 6.0 04/06/2018 1311   GLUCOSEU NEGATIVE 04/06/2018 1311   HGBUR NEGATIVE 04/06/2018 1311   BILIRUBINUR NEGATIVE 04/06/2018 1311   KETONESUR NEGATIVE 04/06/2018 1311   PROTEINUR NEGATIVE 04/06/2018 1311   UROBILINOGEN 0.2 02/17/2015 2040   NITRITE NEGATIVE 04/06/2018 1311   LEUKOCYTESUR NEGATIVE 04/06/2018 1311   Sepsis Labs:  !!!!!!!!!!!!!!!!!!!!!!!!!!!!!!!!!!!!!!!!!!!! @LABRCNTIP (procalcitonin:4,lacticidven:4) ) Recent Results (from the past  240 hour(s))  Blood Culture (routine x 2)     Status: None (Preliminary result)   Collection Time: 04/06/18  2:00 PM  Result Value Ref Range Status   Specimen Description LEFT ANTECUBITAL DRAWN BY RN  Final   Special Requests   Final    Blood Culture adequate volume BOTTLES DRAWN AEROBIC AND ANAEROBIC Performed at [facility], [address], [place], [place] [address]    Culture PENDING  Incomplete   Report Status PENDING  Incomplete  Blood Culture (routine x 2)     Status: None (Preliminary result)   Collection Time: 04/06/18  2:15 PM  Result Value Ref Range Status   Specimen Description BLOOD RIGHT ARM  Final   Special Requests   Final    BOTTLES DRAWN AEROBIC AND ANAEROBIC Blood Culture adequate volume Performed at [facility], [address], [place], [place] [address]    Culture PENDING  Incomplete   Report Status PENDING  Incomplete     Radiological Exams on Admission: Dg Chest 1 View  Result Date: 04/06/2018 CLINICAL DATA:  67 year old female with history of fever, confusion and tachycardia. EXAM: CHEST  1 VIEW COMPARISON:  Chest x-ray 01/09/2018. FINDINGS: Lung volumes are normal. No consolidative airspace disease. No pleural effusions. No pneumothorax. No pulmonary nodule or mass noted. Pulmonary vasculature and the cardiomediastinal silhouette are within normal limits. Aortic atherosclerosis. Status post median sternotomy for CABG. Left atrial appendage ligation clip also noted. IMPRESSION: 1.  No radiographic evidence of acute cardiopulmonary disease. 2. Aortic atherosclerosis. Electronically Signed   By: Vinnie Langton M.D.   On: 04/06/2018 14:45   Ct Head Wo Contrast  Result Date: 04/06/2018 CLINICAL DATA:  Altered level of consciousness. Status post fall today. EXAM: CT HEAD WITHOUT CONTRAST TECHNIQUE: Contiguous axial images were obtained from the  base of the skull through the vertex without intravenous contrast. COMPARISON:  Brain MRI 08/14/2016.  Head CT scan 01/07/2018. FINDINGS: Brain: No evidence of acute infarction, hemorrhage, hydrocephalus, extra-axial collection or mass lesion/mass effect. Chronic microvascular ischemic change is noted. Vascular: Atherosclerosis noted. Skull: Intact. Sinuses/Orbits: Negative. Other: None. IMPRESSION: No acute abnormality. Chronic microvascular ischemic change. Atherosclerosis. Electronically Signed   By: Inge Rise M.D.   On: 04/06/2018 15:27   Dg Foot Complete Right  Result Date: 04/06/2018 CLINICAL DATA:  Status post fall. EXAM: RIGHT FOOT COMPLETE - 3+ VIEW COMPARISON:  None. FINDINGS: There is no evidence of fracture or dislocation. There is no evidence of arthropathy or other focal bone abnormality. Soft tissues are unremarkable. IMPRESSION: Negative. Electronically Signed   By: Kathreen Devoid   On: 04/06/2018 14:45    EKG: Independently reviewed.  Atrial fib with RVR no acute changes Old chart reviewed Case discussed with EDP Dr. Jeanell Sparrow Chest x-ray personally reviewed no edema or infiltrate  Assessment/Plan 67 year old female with Sirs likely secondary to cellulitis along with A. fib with RVR  Principal Problem:   Sepsis due to undetermined organism (HCC)-patient has a very mild case of cellulitis to her right foot.  Follow-up on blood cultures.  Placed on IV Vanco and Zosyn.  Active Problems:   Atrial fibrillation with RVR (HCC) patient was briefly put on a diltiazem drip due to rate persistently in the 130s.  After patient's fever was appropriately treated with Tylenol and given IV fluids and oral Cardizem load her heart rate came down to the 100 range.  Cardizem drip was weaned off.  Continue her oral Cardizem from home.  Holding her beta-blocker at this  time due to soft blood pressure with a systolic less than 093 at this time.  Continue Eliquis.    Cellulitis in diabetic foot  (HCC)-very mild at this time.  Placed on Vanco Zosyn    Altered mental status-seems to be improving, continue to hold Xanax at this time    Diabetes mellitus (HCC)-sliding scale insulin I am holding her metformin and glipizide at this time along with her Januvia    Chronic pain-continue chronic home meds    COPD (chronic obstructive pulmonary disease) (HCC)-stable at this time continue her on her Xopenex.  Holding albuterol.    Recurrent falls noted-    S/P CABG x 3-noted     CAD (coronary artery disease) stable-    Noncompliance with medications patient reports that she is compliant with her medications at this time-    Chronic diastolic CHF (congestive heart failure) (HCC)-holding her Lasix at this time.  Holding also potassium replacement due to mild elevation of her potassium level.    DVT prophylaxis: Eliquis Code Status: Full Family Communication: None Disposition Plan: 1 to 2 days Consults called: None Admission status: Admission   Khian Remo A MD [facility]  If 7PM-7AM, please contact night-coverage [email or web address] Password [email or web address]  04/06/2018, 4:53 PM

## 2018-04-07 ENCOUNTER — Encounter (HOSPITAL_COMMUNITY): Payer: Self-pay

## 2018-04-07 DIAGNOSIS — A419 Sepsis, unspecified organism: Principal | ICD-10-CM

## 2018-04-07 DIAGNOSIS — I4891 Unspecified atrial fibrillation: Secondary | ICD-10-CM

## 2018-04-07 DIAGNOSIS — I5032 Chronic diastolic (congestive) heart failure: Secondary | ICD-10-CM

## 2018-04-07 DIAGNOSIS — E11628 Type 2 diabetes mellitus with other skin complications: Secondary | ICD-10-CM

## 2018-04-07 DIAGNOSIS — L03119 Cellulitis of unspecified part of limb: Secondary | ICD-10-CM

## 2018-04-07 DIAGNOSIS — J449 Chronic obstructive pulmonary disease, unspecified: Secondary | ICD-10-CM

## 2018-04-07 DIAGNOSIS — E1141 Type 2 diabetes mellitus with diabetic mononeuropathy: Secondary | ICD-10-CM

## 2018-04-07 LAB — BASIC METABOLIC PANEL
ANION GAP: 7 (ref 5–15)
BUN: 19 mg/dL (ref 6–20)
CO2: 29 mmol/L (ref 22–32)
Calcium: 9.1 mg/dL (ref 8.9–10.3)
Chloride: 99 mmol/L — ABNORMAL LOW (ref 101–111)
Creatinine, Ser: 0.8 mg/dL (ref 0.44–1.00)
GFR calc Af Amer: >60 mL/min (ref >60)
GLUCOSE: 147 mg/dL — AB (ref 65–99)
POTASSIUM: 4 mmol/L (ref 3.5–5.1)
Sodium: 135 mmol/L (ref 135–145)

## 2018-04-07 LAB — GLUCOSE, CAPILLARY
GLUCOSE-CAPILLARY: 216 mg/dL — AB (ref 65–99)
GLUCOSE-CAPILLARY: 198 mg/dL — AB (ref 65–99)
Glucose-Capillary: 142 mg/dL — ABNORMAL HIGH (ref 65–99)
Glucose-Capillary: 120 mg/dL — ABNORMAL HIGH (ref 65–99)
Glucose-Capillary: 195 mg/dL — ABNORMAL HIGH (ref 65–99)

## 2018-04-07 LAB — CBC
HEMATOCRIT: 26.7 % — AB (ref 36.0–46.0)
HEMOGLOBIN: 8.8 g/dL — AB (ref 12.0–15.0)
MCH: 29.5 pg (ref 26.0–34.0)
MCHC: 33 g/dL (ref 30.0–36.0)
MCV: 89.6 fL (ref 78.0–100.0)
Platelets: 128 10*3/uL — ABNORMAL LOW (ref 150–400)
RBC: 2.98 MIL/uL — ABNORMAL LOW (ref 3.87–5.11)
RDW: 16.2 % — ABNORMAL HIGH (ref 11.5–15.5)
WBC: 6.5 10*3/uL (ref 4.0–10.5)

## 2018-04-07 MED ORDER — LEVALBUTEROL HCL 0.63 MG/3ML IN NEBU
0.6300 mg | INHALATION_SOLUTION | Freq: Three times a day (TID) | RESPIRATORY_TRACT | Status: DC
  Administered 2018-04-08 (×2): 0.63 mg via RESPIRATORY_TRACT
  Filled 2018-04-07 (×2): qty 3

## 2018-04-07 MED ORDER — SODIUM CHLORIDE 0.9 % IV SOLN
INTRAVENOUS | Status: DC
  Administered 2018-04-07 – 2018-04-08 (×2): via INTRAVENOUS

## 2018-04-07 MED ORDER — HYDROXYZINE HCL 25 MG PO TABS
25.0000 mg | ORAL_TABLET | Freq: Three times a day (TID) | ORAL | Status: DC | PRN
  Administered 2018-04-07 – 2018-04-08 (×2): 25 mg via ORAL
  Filled 2018-04-07 (×2): qty 1

## 2018-04-07 MED ORDER — TRAMADOL HCL 50 MG PO TABS
50.0000 mg | ORAL_TABLET | Freq: Four times a day (QID) | ORAL | Status: DC | PRN
  Administered 2018-04-07 – 2018-04-08 (×2): 50 mg via ORAL
  Filled 2018-04-07 (×2): qty 1

## 2018-04-07 MED ORDER — LEVALBUTEROL HCL 0.63 MG/3ML IN NEBU: 0.6300 mg | INHALATION_SOLUTION | Freq: Four times a day (QID) | RESPIRATORY_TRACT | Status: DC | PRN

## 2018-04-07 MED ORDER — METOPROLOL TARTRATE 25 MG PO TABS
12.5000 mg | ORAL_TABLET | Freq: Two times a day (BID) | ORAL | Status: DC
  Administered 2018-04-07 – 2018-04-08 (×2): 12.5 mg via ORAL
  Filled 2018-04-07 (×2): qty 1

## 2018-04-07 NOTE — Progress Notes (Signed)
Initial Nutrition Assessment  DOCUMENTATION CODES:   Underweight  INTERVENTION:   No immediate interventions required.  Monitor PO intake to make sure pt is eating and meeting calorie needs.  NUTRITION DIAGNOSIS:   Underweight related to chronic illness as evidenced by underweight BMI classification (<18.5).  GOAL:   Patient will meet greater than or equal to 90% of their needs  MONITOR:   PO intake  REASON FOR ASSESSMENT:   Other (Comment)(Low BMI)    ASSESSMENT:  67 y/o female with Hx of afib, CHF, COPD, DM2, HLD, and HTN.  Admitted for confusion and fever after presenting to ED with right anterior foot pain, swelling, and redness.  RD followed due to low BMI.  Pt reviewed for underweight BMI.  Spoke to pt and she said she had a good appetite and has not had issues with eating or with weight loss.  Due to CHF and DM2, pt said she weighs herself daily and checks blood sugar every morning.  Normal morning blood sugar for her is 120-127.  Normal/usual daily weight for pt is 117-118 lbs which is also her CBW.  Pt takes many pills everyday including her fluid pill, potassium, and fusion plus (a probiotic with iron).    There is no recorded intake, but according to pt, she does not have any apparent appetite or weight concerns, so no interventions are needed at this time.  Continue monitoring PO intake to make sure self-reported appetite and actual appetite match and that pt is meeting nutrition needs.     Labs reviewed: Cl: 99, Glucose: 147 Recent Labs  Lab 04/06/18 1338 04/07/18 0420  NA 128* 135  K 5.5* 4.0  CL 89* 99*  CO2 28 29  BUN 31* 19  CREATININE 1.18* 0.80  CALCIUM 9.8 9.1  GLUCOSE 184* 147*   Medications: Lipitor, cardizem cd, insulin, prinivil, mag-ox, protonix, zosyn, vancocin   Past Medical History:  Diagnosis Date  . Anxiety   . Arthritis   . Atrial fibrillation ([place])   . Bronchial asthma   . CHF (congestive heart failure) ([place])   . Chronic  pain    Bacl pain, Disc L5-S1- Dr. Joya Salm in [place]  . COPD (chronic obstructive pulmonary disease) ([place])   . DDD (degenerative disc disease)    Radicular symptoms  . Diabetes mellitus   . History of stress test 06/2011   Abnormal myocardial perfusion study.  Marland Kitchen Hx of echocardiogram    Was interpreted by Dr Doylene Canard that showed an Ef in the 50-60% range with grade 1 diastolic dysfunction. she had moderate MR, biatrial enlargement, moderate TR and at that time estimated RV systolic pressure was 43 mm.  . Hyperlipidemia   . Hypertension   . Neuropathy   . Shortness of breath   . Tachycardia    NUTRITION - FOCUSED PHYSICAL EXAM: Deferred, no wt loss or signs of poor appetite.  Diet Order:   Diet Order           Diet heart healthy/carb modified Room service appropriate? Yes; Fluid consistency: Thin  Diet effective now         EDUCATION NEEDS:   No education needs have been identified at this time  Skin:  Skin Assessment: Skin Integrity Issues: Skin Integrity Issues:: Other (Comment) Other: Ecchymosis: arm  Last BM:  6/13  Height:   Ht Readings from Last 1 Encounters:  04/06/18 5\' 7"  (1.702 m)   Weight:   Wt Readings from Last 1 Encounters:  04/06/18 117 lb (  53.1 kg)   Ideal Body Weight:  61.2 kg  BMI:  Body mass index is 18.32 kg/m.   Adjusted Body Weight: 59.2 kg  Estimated Nutritional Needs:   Kcal:  1600-1780 kcal (27-30 kcal/kg adjusted bw)  Protein:  80-89 g (20% kcal)  Fluid:  1600-1780 mL (8mL/kcal)

## 2018-04-07 NOTE — Progress Notes (Signed)
PROGRESS NOTE    Mary [name]  WLN:[id number] DOB: September 03, 1951 DOA: 04/06/2018 PCP: Celene Squibb, MD    Brief Narrative:  67 year old female admitted to the hospital with sepsis secondary to right foot cellulitis.  Also had rapid atrial fibrillation.  Started on intravenous antibiotics and IV fluids.  Atrial fibrillation has improved as fevers have resolved.  She is continued on rate control with diltiazem, metoprolol.  She is anticoagulated with apixaban.  Continue IV antibiotics for now.   Assessment & Plan:   Principal Problem:   Sepsis ([place]) Active Problems:   Diabetes mellitus (H[facility])   Chronic pain   COPD (chronic obstructive pulmonary disease) (H[facility])   Altered mental status   Recurrent falls   S/P CABG x 3   CAD (coronary artery disease)   Noncompliance with medications   Chronic diastolic CHF (congestive heart failure) (H[facility])   Atrial fibrillation with RVR ([place])   Cellulitis in diabetic foot ([place])   1. Sepsis.  Related to cellulitis.  Hemodynamics are stabilizing.  Follow-up blood culture.  Continue IV antibiotics. 2. Right foot cellulitis.  Continue on vancomycin and Zosyn.  Continues to have erythema, swelling and warmth. 3. Rapid atrial fibrillation.  Continued on home dose of diltiazem.  We will add low-dose metoprolol.  She is anticoagulated with apixaban.  Monitor blood pressure. 4. Diabetes.  Continue on sliding scale insulin.  Metformin, Januvia and glipizide currently on hold. 5. Chronic pain syndrome.  Continue home chronic pain medications. 6. COPD.  Continue on bronchodilators.  No wheezing at this time. 7. Altered mental status.  Suspect is related to infectious process.  She is also on Xanax which is currently on hold.  Mental status is better today. 8. Chronic diastolic CHF.  Appears compensated.  Lasix currently on hold.   DVT prophylaxis: Apixaban Code Status: Full code  Family Communication: No family present Disposition Plan: Discharge home once  improved   Consultants:     Procedures:     Antimicrobials:   Vancomycin 6/12 >  Zosyn 6/12 >   Subjective: Has continued pain in her right foot.  Objective: Vitals:   04/07/18 0847 04/07/18 0856 04/07/18 1306 04/07/18 1424  BP:   (!) 101/58   Pulse:   (!) 114   Resp:   20   Temp:   97.8 F (36.6 C)   TempSrc:   Oral   SpO2: 98% 97% 97% (!) 89%  Weight:      Height:        Intake/Output Summary (Last 24 hours) at 04/07/2018 1852 Last data filed at 04/07/2018 1800 Gross per 24 hour  Intake 1421.62 ml  Output 1900 ml  Net -478.38 ml   Filed Weights   04/06/18 1226  Weight: 53.1 kg (117 lb)    Examination:  General exam: Appears calm and comfortable  Respiratory system: Clear to auscultation. Respiratory effort normal. Cardiovascular system: S1 & S2 heard, irregular. No JVD, murmurs, rubs, gallops or clicks.  Gastrointestinal system: Abdomen is nondistended, soft and nontender. No organomegaly or masses felt. Normal bowel sounds heard. Central nervous system: Alert and oriented. No focal neurological deficits. Extremities: Right foot is edematous, warm and erythematous. Skin: No rashes, lesions or ulcers Psychiatry: Judgement and insight appear normal. Mood & affect appropriate.     Data Reviewed: I have personally reviewed following labs and imaging studies  CBC: Recent Labs  Lab 04/06/18 1338 04/07/18 0420  WBC 9.5 6.5  NEUTROABS 7.3  --   HGB 10.2* 8.8*  HCT 31.1* 26.7*  MCV 89.4 89.6  PLT 140* 093*   Basic Metabolic Panel: Recent Labs  Lab 04/06/18 1338 04/07/18 0420  NA 128* 135  K 5.5* 4.0  CL 89* 99*  CO2 28 29  GLUCOSE 184* 147*  BUN 31* 19  CREATININE 1.18* 0.80  CALCIUM 9.8 9.1   GFR: Estimated Creatinine Clearance: 56.4 mL/min (by C-G formula based on SCr of 0.8 mg/dL). Liver Function Tests: Recent Labs  Lab 04/06/18 1338  AST 34  ALT 24  ALKPHOS 59  BILITOT 0.9  PROT 7.2  ALBUMIN 4.2   No results for input(s):  LIPASE, AMYLASE in the last 168 hours. No results for input(s): AMMONIA in the last 168 hours. Coagulation Profile: No results for input(s): INR, PROTIME in the last 168 hours. Cardiac Enzymes: No results for input(s): CKTOTAL, CKMB, CKMBINDEX, TROPONINI in the last 168 hours. BNP (last 3 results) No results for input(s): PROBNP in the last 8760 hours. HbA1C: Recent Labs    04/06/18 1338  HGBA1C 6.7*   CBG: Recent Labs  Lab 04/06/18 2054 04/07/18 0557 04/07/18 0719 04/07/18 1140 04/07/18 1615  GLUCAP 108* 142* 120* 216* 198*   Lipid Profile: No results for input(s): CHOL, HDL, LDLCALC, TRIG, CHOLHDL, LDLDIRECT in the last 72 hours. Thyroid Function Tests: No results for input(s): TSH, T4TOTAL, FREET4, T3FREE, THYROIDAB in the last 72 hours. Anemia Panel: No results for input(s): VITAMINB12, FOLATE, FERRITIN, TIBC, IRON, RETICCTPCT in the last 72 hours. Sepsis Labs: Recent Labs  Lab 04/06/18 1343 04/06/18 1552  LATICACIDVEN 1.19 0.77    Recent Results (from the past 240 hour(s))  Blood Culture (routine x 2)     Status: None (Preliminary result)   Collection Time: 04/06/18  2:00 PM  Result Value Ref Range Status   Specimen Description LEFT ANTECUBITAL DRAWN BY RN  Final   Special Requests   Final    Blood Culture adequate volume BOTTLES DRAWN AEROBIC AND ANAEROBIC   Culture   Final    NO GROWTH < 24 HOURS Performed at [facility], [address], [place], [place] [address]    Report Status PENDING  Incomplete  Blood Culture (routine x 2)     Status: None (Preliminary result)   Collection Time: 04/06/18  2:15 PM  Result Value Ref Range Status   Specimen Description BLOOD RIGHT ARM  Final   Special Requests   Final    BOTTLES DRAWN AEROBIC AND ANAEROBIC Blood Culture adequate volume   Culture   Final    NO GROWTH < 24 HOURS Performed at [facility], [address], [place], [place] [address]    Report Status PENDING  Incomplete         Radiology  Studies: Dg Chest 1 View  Result Date: 04/06/2018 CLINICAL DATA:  67 year old female with history of fever, confusion and tachycardia. EXAM: CHEST  1 VIEW COMPARISON:  Chest x-ray 01/09/2018. FINDINGS: Lung volumes are normal. No consolidative airspace disease. No pleural effusions. No pneumothorax. No pulmonary nodule or mass noted. Pulmonary vasculature and the cardiomediastinal silhouette are within normal limits. Aortic atherosclerosis. Status post median sternotomy for CABG. Left atrial appendage ligation clip also noted. IMPRESSION: 1.  No radiographic evidence of acute cardiopulmonary disease. 2. Aortic atherosclerosis. Electronically Signed   By: Vinnie Langton M.D.   On: 04/06/2018 14:45   Ct Head Wo Contrast  Result Date: 04/06/2018 CLINICAL DATA:  Altered level of consciousness. Status post fall today. EXAM: CT HEAD WITHOUT CONTRAST TECHNIQUE: Contiguous axial  images were obtained from the base of the skull through the vertex without intravenous contrast. COMPARISON:  Brain MRI 08/14/2016.  Head CT scan 01/07/2018. FINDINGS: Brain: No evidence of acute infarction, hemorrhage, hydrocephalus, extra-axial collection or mass lesion/mass effect. Chronic microvascular ischemic change is noted. Vascular: Atherosclerosis noted. Skull: Intact. Sinuses/Orbits: Negative. Other: None. IMPRESSION: No acute abnormality. Chronic microvascular ischemic change. Atherosclerosis. Electronically Signed   By: Inge Rise M.D.   On: 04/06/2018 15:27   Dg Foot Complete Right  Result Date: 04/06/2018 CLINICAL DATA:  Status post fall. EXAM: RIGHT FOOT COMPLETE - 3+ VIEW COMPARISON:  None. FINDINGS: There is no evidence of fracture or dislocation. There is no evidence of arthropathy or other focal bone abnormality. Soft tissues are unremarkable. IMPRESSION: Negative. Electronically Signed   By: Kathreen Devoid   On: 04/06/2018 14:45        Scheduled Meds: . apixaban  5 mg Oral BID  . atorvastatin  40 mg  Oral Daily  . diltiazem  120 mg Oral Daily  . DULoxetine  30 mg Oral Daily  . insulin aspart  0-9 Units Subcutaneous TID WC  . levalbuterol  0.63 mg Nebulization Q6H  . magnesium oxide  200 mg Oral Daily  . metoprolol tartrate  12.5 mg Oral BID  . mometasone-formoterol  2 puff Inhalation BID  . montelukast  10 mg Oral Daily  . pantoprazole  40 mg Oral Daily   Continuous Infusions: . sodium chloride    . piperacillin-tazobactam (ZOSYN)  IV Stopped (04/07/18 1644)  . vancomycin Stopped (04/07/18 1513)     LOS: 1 day    Time spent: 84mins    Kathie Dike, MD [facility] Pager [phone]  If 7PM-7AM, please contact night-coverage [email or web address] Password [email or web address] 04/07/2018, 6:52 PM

## 2018-04-07 NOTE — Clinical Social Work Note (Signed)
Clinical Social Work Assessment  Patient Details  Name: Mary [name] MRN: [id number] Date of Birth: January 28, 1951  Date of referral:  04/07/18               Reason for consult:  Facility Placement                Permission sought to share information with:    Permission granted to share information::     Name::        Agency::     Relationship::     Contact Information:     Housing/Transportation Living arrangements for the past 2 months:  Apartment Source of Information:  Patient Patient Interpreter Needed:  None Criminal Activity/Legal Involvement Pertinent to Current Situation/Hospitalization:  No - Comment as needed Significant Relationships:  None Lives with:  Self Do you feel safe going back to the place where you live?  Yes Need for family participation in patient care:  Yes (Comment)  Care giving concerns:  Patient is independent at baseline.   Social Worker assessment / plan:  Patient is independent at baseline and drives. She is not currently interested in any form of ALF or SNF.  She states that she plans on going home at discharge.  LCSW signing off.   Employment status:  Disabled (Comment on whether or not currently receiving Disability) Insurance information:  Managed Medicare PT Recommendations:  Not assessed at this time Information / Referral to community resources:     Patient/Family's Response to care:  Patient plans on returning home at discharge.   Patient/Family's Understanding of and Emotional Response to Diagnosis, Current Treatment, and Prognosis:  Patient understands her diagnosis, treatment and prognosis.   Emotional Assessment Appearance:  Appears stated age Attitude/Demeanor/Rapport:    Affect (typically observed):  Accepting Orientation:  Oriented to Self, Oriented to Place, Oriented to  Time, Oriented to Situation Alcohol / Substance use:  Not Applicable Psych involvement (Current and /or in the community):  No (Comment)  Discharge Needs   Concerns to be addressed:  Discharge Planning Concerns Readmission within the last 30 days:  No Current discharge risk:  None Barriers to Discharge:  No Barriers Identified   Ihor Gully, LCSW 04/07/2018, 1:38 PM

## 2018-04-08 LAB — BASIC METABOLIC PANEL
ANION GAP: 7 (ref 5–15)
BUN: 9 mg/dL (ref 6–20)
CHLORIDE: 103 mmol/L (ref 101–111)
CO2: 28 mmol/L (ref 22–32)
Calcium: 8.9 mg/dL (ref 8.9–10.3)
Creatinine, Ser: 0.67 mg/dL (ref 0.44–1.00)
GFR calc non Af Amer: >60 mL/min (ref >60)
GLUCOSE: 116 mg/dL — AB (ref 65–99)
POTASSIUM: 4.1 mmol/L (ref 3.5–5.1)
Sodium: 138 mmol/L (ref 135–145)

## 2018-04-08 LAB — CBC
HEMATOCRIT: 25.8 % — AB (ref 36.0–46.0)
HEMOGLOBIN: 8.4 g/dL — AB (ref 12.0–15.0)
MCH: 29.4 pg (ref 26.0–34.0)
MCHC: 32.6 g/dL (ref 30.0–36.0)
MCV: 90.2 fL (ref 78.0–100.0)
Platelets: 132 10*3/uL — ABNORMAL LOW (ref 150–400)
RBC: 2.86 MIL/uL — AB (ref 3.87–5.11)
RDW: 16.6 % — ABNORMAL HIGH (ref 11.5–15.5)
WBC: 6.2 10*3/uL (ref 4.0–10.5)

## 2018-04-08 LAB — GLUCOSE, CAPILLARY
GLUCOSE-CAPILLARY: 132 mg/dL — AB (ref 65–99)
Glucose-Capillary: 264 mg/dL — ABNORMAL HIGH (ref 65–99)

## 2018-04-08 MED ORDER — TRAMADOL HCL 50 MG PO TABS: 50.0000 mg | ORAL_TABLET | Freq: Four times a day (QID) | ORAL | 0 refills | Status: DC | PRN

## 2018-04-08 MED ORDER — DOXYCYCLINE HYCLATE 100 MG PO TABS
100.0000 mg | ORAL_TABLET | Freq: Two times a day (BID) | ORAL | Status: DC
  Administered 2018-04-08: 100 mg via ORAL
  Filled 2018-04-08: qty 1

## 2018-04-08 MED ORDER — METOPROLOL TARTRATE 25 MG PO TABS: 12.5000 mg | ORAL_TABLET | Freq: Two times a day (BID) | ORAL | 1 refills | Status: DC

## 2018-04-08 MED ORDER — DOXYCYCLINE HYCLATE 100 MG PO TABS: 100.0000 mg | ORAL_TABLET | Freq: Two times a day (BID) | ORAL | 0 refills | Status: DC

## 2018-04-08 NOTE — Care Management Important Message (Signed)
Important Message  Patient Details  Name: Mary [name] MRN: [id number] Date of Birth: [birth year]   Medicare Important Message Given:  Yes    Sherald Barge, RN 04/08/2018, 12:29 PM

## 2018-04-08 NOTE — Discharge Summary (Addendum)
Physician Discharge Summary  Mary [name] HYW:[id number] DOB: [birth year] DOA: 04/06/2018  PCP: Celene Squibb, MD  Admit date: 04/06/2018 Discharge date: 04/08/2018  Admitted From: Home Disposition: Home  Recommendations for Outpatient Follow-up:  1. Follow up with PCP in 1-2 weeks 2. Please obtain BMP/CBC in one week  Home Health: Home health physical therapy Equipment/Devices: Walker  Discharge Condition: Stable CODE STATUS: Full code Diet recommendation: Heart healthy, diabetic diet  Brief/Interim Summary: 67 year old female admitted to the hospital with sepsis secondary to right foot cellulitis.  Also had rapid atrial fibrillation.  Started on intravenous antibiotics and IV fluids.  Atrial fibrillation has improved as fevers have resolved.  She is continued on rate control with diltiazem, metoprolol.  She is anticoagulated with apixaban.    Discharge Diagnoses:  Principal Problem:   Sepsis ([place]) Active Problems:   Diabetes mellitus (H[facility])   Chronic pain   COPD (chronic obstructive pulmonary disease) (H[facility])   Altered mental status   Recurrent falls   S/P CABG x 3   CAD (coronary artery disease)   Noncompliance with medications   Chronic diastolic CHF (congestive heart failure) (H[facility])   Atrial fibrillation with RVR ([name])   Cellulitis in diabetic foot ([place])  1. Sepsis.  Related to cellulitis.    IV fluids and IV antibiotics.  Hemodynamics are stable.  Blood cultures have shown no growth.  Transition to oral antibiotics.. 2. Right foot cellulitis.    Treated with vancomycin and Zosyn.  Erythema, warmth and swelling have improved.  Transition to oral doxy. 3. Rapid atrial fibrillation.  Continued on home dose of diltiazem.  She is also chronically on metoprolol, but since blood pressures were soft, metoprolol was adjusted to lower dose.    Heart rates and blood pressures have been stable.  She is anticoagulated with apixaban.    Metoprolol can be further titrated as an outpatient as  blood pressure will tolerate 4. Diabetes.    Treated on sliding scale insulin.  Metformin, Januvia and glipizide were held in the hospital, but can be resumed on discharge 5. Chronic pain syndrome.  Continue home chronic pain medications. 6. COPD.  Continued on bronchodilators.  No wheezing at this time. 7. Acute metabolic encephalopathy related to sepsis.  She is also on Xanax which was held on admission.  Mental status is back to baseline. 8. Chronic diastolic CHF.  Appears compensated.    Lasix was held on admission due to sepsis.  This can be resumed on discharge     Discharge Instructions  Discharge Instructions    Diet - low sodium heart healthy   Complete by:  As directed    Increase activity slowly   Complete by:  As directed      Allergies as of 04/08/2018      Reactions   Adhesive [tape] Other (See Comments)   Tears skin off.    Latex Other (See Comments)   In tape, tears skin off.   Zocor [simvastatin - High Dose] Nausea And Vomiting      Medication List    STOP taking these medications   lisinopril 2.5 MG tablet Commonly known as:  PRINIVIL,ZESTRIL     TAKE these medications   acetaminophen 500 MG tablet Commonly known as:  TYLENOL Take 500 mg by mouth every 6 (six) hours as needed for mild pain.   albuterol 108 (90 Base) MCG/ACT inhaler Commonly known as:  PROVENTIL HFA;VENTOLIN HFA Inhale 2 puffs into the lungs every 6 (six) hours as needed  for shortness of breath.   apixaban 5 MG Tabs tablet Commonly known as:  ELIQUIS Take 1 tablet (5 mg total) by mouth 2 (two) times daily. Take place of Pradaxa   atorvastatin 40 MG tablet Commonly known as:  LIPITOR Take 1 tablet (40 mg total) by mouth daily. NEED OV.   diltiazem 120 MG 24 hr capsule Commonly known as:  CARDIZEM CD Take 1 capsule (120 mg total) by mouth daily.   doxycycline 100 MG tablet Commonly known as:  VIBRA-TABS Take 1 tablet (100 mg total) by mouth every 12 (twelve) hours.    DULoxetine 30 MG capsule Commonly known as:  CYMBALTA Take 1 capsule by mouth daily.   furosemide 40 MG tablet Commonly known as:  LASIX Take 1 tablet (40 mg total) by mouth daily. KEEP OV.   FUSION PLUS PO Take 1 tablet by mouth daily.   gabapentin 100 MG capsule Commonly known as:  NEURONTIN Take 100 mg by mouth every 8 (eight) hours as needed (back pain).   glipiZIDE 2.5 MG 24 hr tablet Commonly known as:  GLUCOTROL XL 2.5 mg daily.   JANUVIA 100 MG tablet Generic drug:  sitaGLIPtin TAKE ONE TABLET BY MOUTH DAILY.   levalbuterol 0.63 MG/3ML nebulizer solution Commonly known as:  XOPENEX Take 3 mLs (0.63 mg total) by nebulization every 6 (six) hours.   Magnesium Oxide 250 MG Tabs Take 250 mg by mouth daily.   metFORMIN 850 MG tablet Commonly known as:  GLUCOPHAGE TAKE 1 TABLET BY MOUTH TWICE DAILY WITH MEALS.   metoprolol tartrate 25 MG tablet Commonly known as:  LOPRESSOR Take 0.5 tablets (12.5 mg total) by mouth 2 (two) times daily. What changed:    medication strength  See the new instructions.   montelukast 10 MG tablet Commonly known as:  SINGULAIR Take 1 tablet by mouth daily.   multivitamin capsule Take 1 capsule by mouth daily.   pantoprazole 40 MG tablet Commonly known as:  PROTONIX Take 40 mg by mouth daily.   potassium chloride SA 20 MEQ tablet Commonly known as:  K-DUR,KLOR-CON Take 1 tablet (20 mEq total) by mouth daily.   SYMBICORT 160-4.5 MCG/ACT inhaler Generic drug:  budesonide-formoterol INHALE 2 PUFFS INTO THE LUNGS TWICE DAILY. RINSE MOUTH AFTER USE.   traMADol 50 MG tablet Commonly known as:  ULTRAM Take 1 tablet (50 mg total) by mouth every 6 (six) hours as needed for moderate pain.            Durable Medical Equipment  (From admission, onward)        Start     Ordered   04/08/18 1515  For home use only DME Walker rolling  Once    Question:  Patient needs a walker to treat with the following condition  Answer:   Weakness   04/08/18 1514      Allergies  Allergen Reactions  . Adhesive [Tape] Other (See Comments)    Tears skin off.   . Latex Other (See Comments)    In tape, tears skin off.  . Zocor [Simvastatin - High Dose] Nausea And Vomiting    Consultations:     Procedures/Studies: Dg Chest 1 View  Result Date: 04/06/2018 CLINICAL DATA:  67 year old female with history of fever, confusion and tachycardia. EXAM: CHEST  1 VIEW COMPARISON:  Chest x-ray 01/09/2018. FINDINGS: Lung volumes are normal. No consolidative airspace disease. No pleural effusions. No pneumothorax. No pulmonary nodule or mass noted. Pulmonary vasculature and the cardiomediastinal silhouette are within  normal limits. Aortic atherosclerosis. Status post median sternotomy for CABG. Left atrial appendage ligation clip also noted. IMPRESSION: 1.  No radiographic evidence of acute cardiopulmonary disease. 2. Aortic atherosclerosis. Electronically Signed   By: Vinnie Langton M.D.   On: 04/06/2018 14:45   Ct Head Wo Contrast  Result Date: 04/06/2018 CLINICAL DATA:  Altered level of consciousness. Status post fall today. EXAM: CT HEAD WITHOUT CONTRAST TECHNIQUE: Contiguous axial images were obtained from the base of the skull through the vertex without intravenous contrast. COMPARISON:  Brain MRI 08/14/2016.  Head CT scan 01/07/2018. FINDINGS: Brain: No evidence of acute infarction, hemorrhage, hydrocephalus, extra-axial collection or mass lesion/mass effect. Chronic microvascular ischemic change is noted. Vascular: Atherosclerosis noted. Skull: Intact. Sinuses/Orbits: Negative. Other: None. IMPRESSION: No acute abnormality. Chronic microvascular ischemic change. Atherosclerosis. Electronically Signed   By: Inge Rise M.D.   On: 04/06/2018 15:27   Dg Foot Complete Right  Result Date: 04/06/2018 CLINICAL DATA:  Status post fall. EXAM: RIGHT FOOT COMPLETE - 3+ VIEW COMPARISON:  None. FINDINGS: There is no evidence of fracture  or dislocation. There is no evidence of arthropathy or other focal bone abnormality. Soft tissues are unremarkable. IMPRESSION: Negative. Electronically Signed   By: Kathreen Devoid   On: 04/06/2018 14:45       Subjective: Still complains of pain in right foot, but feels her erythema and swelling is better  Discharge Exam: Vitals:   04/08/18 1341 04/08/18 1412  BP: 105/69   Pulse: 91 97  Resp: 18   Temp: 98.5 F (36.9 C)   SpO2: 100% 94%   Vitals:   04/08/18 0732 04/08/18 1336 04/08/18 1341 04/08/18 1412  BP:   105/69   Pulse:   91 97  Resp:   18   Temp:   98.5 F (36.9 C)   TempSrc:   Oral   SpO2: 96% 94% 100% 94%  Weight:      Height:        General: Pt is alert, awake, not in acute distress Cardiovascular: RRR, S1/S2 +, no rubs, no gallops Respiratory: CTA bilaterally, no wheezing, no rhonchi Abdominal: Soft, NT, ND, bowel sounds + Extremities: Erythema and right foot has significantly improved as has swelling    The results of significant diagnostics from this hospitalization (including imaging, microbiology, ancillary and laboratory) are listed below for reference.     Microbiology: Recent Results (from the past 240 hour(s))  Blood Culture (routine x 2)     Status: None (Preliminary result)   Collection Time: 04/06/18  2:00 PM  Result Value Ref Range Status   Specimen Description LEFT ANTECUBITAL DRAWN BY RN  Final   Special Requests   Final    Blood Culture adequate volume BOTTLES DRAWN AEROBIC AND ANAEROBIC   Culture   Final    NO GROWTH 2 DAYS Performed at [facility], [address] [place], [place] [address]    Report Status PENDING  Incomplete  Blood Culture (routine x 2)     Status: None (Preliminary result)   Collection Time: 04/06/18  2:15 PM  Result Value Ref Range Status   Specimen Description BLOOD RIGHT ARM  Final   Special Requests   Final    BOTTLES DRAWN AEROBIC AND ANAEROBIC Blood Culture adequate volume   Culture   Final    NO GROWTH  2 DAYS Performed at [facility], [address] [place], [place] [address]    Report Status PENDING  Incomplete     Labs: BNP (  last 3 results) Recent Labs    11/15/17 1146 01/07/18 1909 01/10/18 0817  BNP 277.0* 269.0* 270.3*   Basic Metabolic Panel: Recent Labs  Lab 04/06/18 1338 04/07/18 0420 04/08/18 0501  NA 128* 135 138  K 5.5* 4.0 4.1  CL 89* 99* 103  CO2 28 29 28   GLUCOSE 184* 147* 116*  BUN 31* 19 9  CREATININE 1.18* 0.80 0.67  CALCIUM 9.8 9.1 8.9   Liver Function Tests: Recent Labs  Lab 04/06/18 1338  AST 34  ALT 24  ALKPHOS 59  BILITOT 0.9  PROT 7.2  ALBUMIN 4.2   No results for input(s): LIPASE, AMYLASE in the last 168 hours. No results for input(s): AMMONIA in the last 168 hours. CBC: Recent Labs  Lab 04/06/18 1338 04/07/18 0420 04/08/18 0501  WBC 9.5 6.5 6.2  NEUTROABS 7.3  --   --   HGB 10.2* 8.8* 8.4*  HCT 31.1* 26.7* 25.8*  MCV 89.4 89.6 90.2  PLT 140* 128* 132*   Cardiac Enzymes: No results for input(s): CKTOTAL, CKMB, CKMBINDEX, TROPONINI in the last 168 hours. BNP: Invalid input(s): POCBNP CBG: Recent Labs  Lab 04/07/18 1140 04/07/18 1615 04/07/18 2126 04/08/18 0733 04/08/18 1125  GLUCAP 216* 198* 195* 132* 264*   D-Dimer No results for input(s): DDIMER in the last 72 hours. Hgb A1c Recent Labs    04/06/18 1338  HGBA1C 6.7*   Lipid Profile No results for input(s): CHOL, HDL, LDLCALC, TRIG, CHOLHDL, LDLDIRECT in the last 72 hours. Thyroid function studies No results for input(s): TSH, T4TOTAL, T3FREE, THYROIDAB in the last 72 hours.  Invalid input(s): FREET3 Anemia work up No results for input(s): VITAMINB12, FOLATE, FERRITIN, TIBC, IRON, RETICCTPCT in the last 72 hours. Urinalysis    Component Value Date/Time   COLORURINE YELLOW 04/06/2018 1311   APPEARANCEUR CLEAR 04/06/2018 1311   LABSPEC 1.008 04/06/2018 1311   PHURINE 6.0 04/06/2018 1311   GLUCOSEU NEGATIVE 04/06/2018 1311   HGBUR NEGATIVE 04/06/2018  1311   BILIRUBINUR NEGATIVE 04/06/2018 1311   KETONESUR NEGATIVE 04/06/2018 1311   PROTEINUR NEGATIVE 04/06/2018 1311   UROBILINOGEN 0.2 02/17/2015 2040   NITRITE NEGATIVE 04/06/2018 1311   LEUKOCYTESUR NEGATIVE 04/06/2018 1311   Sepsis Labs Invalid input(s): PROCALCITONIN,  WBC,  LACTICIDVEN Microbiology Recent Results (from the past 240 hour(s))  Blood Culture (routine x 2)     Status: None (Preliminary result)   Collection Time: 04/06/18  2:00 PM  Result Value Ref Range Status   Specimen Description LEFT ANTECUBITAL DRAWN BY RN  Final   Special Requests   Final    Blood Culture adequate volume BOTTLES DRAWN AEROBIC AND ANAEROBIC   Culture   Final    NO GROWTH 2 DAYS Performed at [facility], [address] [place], [place] [address]    Report Status PENDING  Incomplete  Blood Culture (routine x 2)     Status: None (Preliminary result)   Collection Time: 04/06/18  2:15 PM  Result Value Ref Range Status   Specimen Description BLOOD RIGHT ARM  Final   Special Requests   Final    BOTTLES DRAWN AEROBIC AND ANAEROBIC Blood Culture adequate volume   Culture   Final    NO GROWTH 2 DAYS Performed at [facility], [address], [place], [place] [address]    Report Status PENDING  Incomplete     Time coordinating discharge: 71mins  SIGNED:   Kathie Dike, MD  [facility] 04/08/2018, 3:55 PM Pager   If 7PM-7AM, please  contact night-coverage [email or web address] Password [email or web address]

## 2018-04-08 NOTE — Care Management Note (Addendum)
Case Management Note  Patient Details  Name: Mary [name] MRN: [id number] Date of Birth: [birth year]  Subjective/Objective:         Pt from home, lives alone. Pt says she is ind with ADL's. She uses nebs and inhalers pta but does not have home oxygen, is on oxygen acutely now. Pt has used [facility] in the past. She has difficulty affording medications d/t being in the donut hole. She was recently DC'd from [facility] services.            Action/Plan: DC home with referral for [facility]. Pt has chosen [facility], she is aware they have 48 hrs to make first visit. [name], [facility] rep, given referral. Pt will be referred for [facility] calls. Pt will need home O2 assessment as she is still on supplemental oxygen. Pt needs RW, kathy, [facility] rep, per pt choice, aware of referral and will deliver to pt room prior to DC.   Expected Discharge Date:     04/08/2018             Expected Discharge Plan:  [place]  In-House Referral:  NA  Discharge planning Services  CM Consult  Post Acute Care Choice:  Home Health Choice offered to:  Patient  DME Arranged:    rolling walker DME Agency:    [facility]  HH Arranged:  RN, PT [place] Agency:  [place]  Status of Service:  Completed, signed off  If discussed at [place] of Stay Meetings, dates discussed:    Additional Comments:  Sherald Barge, RN 04/08/2018, 12:25 PM

## 2018-04-08 NOTE — Evaluation (Addendum)
Physical Therapy Evaluation Patient Details Name: Mary [name] MRN: [id number] DOB: 08/11/1951 Today's Date: 04/08/2018   History of Present Illness  Mary [name] is a 67 y.o. female with medical history significant of anxiety, congestive heart failure, COPD, A. fib, hypertension presents to the emergency department with right anterior foot pain swelling and redness.  She also noted to have a fever of 102.  She does report she felt a couple of days ago.  She denies any urinary symptoms.  She denies any nausea vomiting or diarrhea.  Patient's foot is mildly swollen and very lightly red.  She was also found to be in A. fib with RVR.  X-rays of the foot have been negative but is very painful to touch superficially on the skin.  She denies rashes anywhere else.  Patient referred for admission for sepsis secondary to cellulitis.    Clinical Impression  Patient mostly limited for ambulation due to right forefoot pain when weightbearing, encouraged to put more body weight on right heel to aleiviate pain with good return demonstrated.  Patient stated less pain in right foot when using RW and encouraged to use RW or her standard walker for a few days after returning home and to go back to using quad cane when right foot feels better.  Plan: patient to be discharged home today and discharged from physical therapy to care of nursing for ambulation ad lib for length of stay.    Follow Up Recommendations Home health PT;Supervision - Intermittent    Equipment Recommendations  Rolling walker with 5" wheels    Recommendations for Other Services       Precautions / Restrictions Precautions Precautions: Fall Restrictions Weight Bearing Restrictions: No      Mobility  Bed Mobility Overal bed mobility: Modified Independent             General bed mobility comments: increased time  Transfers Overall transfer level: Needs assistance Equipment used: Rolling walker (2 wheeled);Quad cane Transfers:  Sit to/from [employer] to Stand: Supervision Stand pivot transfers: Supervision       General transfer comment: demonstrates slow labored movement  Ambulation/Gait Ambulation/Gait assistance: Supervision Gait Distance (Feet): 45 Feet Assistive device: Quad cane;Rolling walker (2 wheeled) Gait Pattern/deviations: Decreased step length - right;Decreased stance time - right;Decreased stride length Gait velocity: slow   General Gait Details: demonstrates slow labored cadence with limited weight bearing on RLE due to increased right forefoot pain with mostly 3 point gait pattern using quad-cane without loss of balance, stated less right foot pain with trial of RW x 15 feet  Stairs            Wheelchair Mobility    Modified Rankin (Stroke Patients Only)       Balance Overall balance assessment: Needs assistance Sitting-balance support: Feet supported;No upper extremity supported Sitting balance-Leahy Scale: Good     Standing balance support: Single extremity supported;During functional activity Standing balance-Leahy Scale: Fair Standing balance comment: using Quad-cane and RW                             Pertinent Vitals/Pain Pain Assessment: Faces Faces Pain Scale: Hurts even more Pain Location: right forefoot with pressure/weightbearing Pain Descriptors / Indicators: Aching;Sharp;Discomfort;Grimacing Pain Intervention(s): Limited activity within patient's tolerance;Monitored during session    [place] expects to be discharged to:: Private residence Living Arrangements: Alone Available Help at Discharge: Friend(s);Neighbor Type of Home: Apartment Home Access:  Stairs to enter Entrance Stairs-Rails: Right;Left;Can reach both Entrance Stairs-Number of Steps: 3 Home Layout: One level Home Equipment: Walker - 2 wheels;Grab bars - tub/shower;Cane - quad;Cane - single point;Bedside commode;Shower seat      Prior  Function Level of Independence: Independent with assistive device(s)         Comments: household and short distanced community with Quad-cane     Hand Dominance   Dominant Hand: Right    Extremity/Trunk Assessment   Upper Extremity Assessment Upper Extremity Assessment: Overall WFL for tasks assessed    Lower Extremity Assessment Lower Extremity Assessment: Overall WFL for tasks assessed;RLE deficits/detail RLE Deficits / Details: RLE grossly 3+/5 mostly due to right forefoot pain       Communication   Communication: No difficulties  Cognition Arousal/Alertness: Awake/alert Behavior During Therapy: WFL for tasks assessed/performed Overall Cognitive Status: Within Functional Limits for tasks assessed                                        General Comments      Exercises     Assessment/Plan    PT Assessment All further PT needs can be met in the next venue of care  PT Problem List Decreased strength;Decreased range of motion;Decreased balance;Decreased mobility       PT Treatment Interventions      PT Goals (Current goals can be found in the Care Plan section)  Acute Rehab PT Goals Patient Stated Goal: return home with friends/neighbors to assist PT Goal Formulation: With patient Time For Goal Achievement: 24-Apr-2018 Potential to Achieve Goals: Good    Frequency     Barriers to discharge        Co-evaluation               AM-PAC PT "6 Clicks" Daily Activity  Outcome Measure Difficulty turning over in bed (including adjusting bedclothes, sheets and blankets)?: None Difficulty moving from lying on back to sitting on the side of the bed? : None Difficulty sitting down on and standing up from a chair with arms (e.g., wheelchair, bedside commode, etc,.)?: None Help needed moving to and from a bed to chair (including a wheelchair)?: A Little Help needed walking in hospital room?: A Little Help needed climbing 3-5 steps with a railing?  : A Little 6 Click Score: 21    End of Session   Activity Tolerance: Patient tolerated treatment well;Patient limited by pain Patient left: in bed;with call bell/phone within reach;with bed alarm set(with RLE elevated) Nurse Communication: Mobility status PT Visit Diagnosis: Unsteadiness on feet (R26.81);Other abnormalities of gait and mobility (R26.89);Muscle weakness (generalized) (M62.81)    Time: 9622-2979 PT Time Calculation (min) (ACUTE ONLY): 30 min   Charges:   PT Evaluation $PT Eval Moderate Complexity: 1 Mod PT Treatments $Therapeutic Activity: 23-37 mins   PT G Codes:        3:30 PM, 04-24-2018 Lonell Grandchild, MPT Physical Therapist with [id number] [phone] office [phone] mobile phone

## 2018-04-08 NOTE — Progress Notes (Signed)
SATURATION QUALIFICATIONS: (This note is used to comply with regulatory documentation for home oxygen)  Patient Saturations on Room Air at Rest = 94%  Patient Saturations on Room Air while Ambulating = 96%  Patient Saturations on  Liters of oxygen while Ambulating = %  Please briefly explain why patient needs home oxygen: no o2 needed

## 2018-04-08 NOTE — Progress Notes (Signed)
Patient discharged home.  IV removed - WNL.  Reviewed AVS and med changes. Emphasized importance of completing dose of Abx and heart failure management at home. Verbalizes understanding, already has apt in place with PCP.  No questions at this time.  Assisted off unit in NAD.

## 2018-04-11 DIAGNOSIS — G894 Chronic pain syndrome: Secondary | ICD-10-CM | POA: Diagnosis not present

## 2018-04-11 DIAGNOSIS — E785 Hyperlipidemia, unspecified: Secondary | ICD-10-CM | POA: Diagnosis not present

## 2018-04-11 DIAGNOSIS — I472 Ventricular tachycardia: Secondary | ICD-10-CM | POA: Diagnosis not present

## 2018-04-11 DIAGNOSIS — I5032 Chronic diastolic (congestive) heart failure: Secondary | ICD-10-CM | POA: Diagnosis not present

## 2018-04-11 DIAGNOSIS — I4891 Unspecified atrial fibrillation: Secondary | ICD-10-CM | POA: Diagnosis not present

## 2018-04-11 DIAGNOSIS — Z7951 Long term (current) use of inhaled steroids: Secondary | ICD-10-CM | POA: Diagnosis not present

## 2018-04-11 DIAGNOSIS — Z7901 Long term (current) use of anticoagulants: Secondary | ICD-10-CM | POA: Diagnosis not present

## 2018-04-11 DIAGNOSIS — E1140 Type 2 diabetes mellitus with diabetic neuropathy, unspecified: Secondary | ICD-10-CM | POA: Diagnosis not present

## 2018-04-11 DIAGNOSIS — M1990 Unspecified osteoarthritis, unspecified site: Secondary | ICD-10-CM | POA: Diagnosis not present

## 2018-04-11 DIAGNOSIS — I110 Hypertensive heart disease with heart failure: Secondary | ICD-10-CM | POA: Diagnosis not present

## 2018-04-11 DIAGNOSIS — I700 Atherosclerosis of aorta: Secondary | ICD-10-CM | POA: Diagnosis not present

## 2018-04-11 DIAGNOSIS — L03115 Cellulitis of right lower limb: Secondary | ICD-10-CM | POA: Diagnosis not present

## 2018-04-11 DIAGNOSIS — J449 Chronic obstructive pulmonary disease, unspecified: Secondary | ICD-10-CM | POA: Diagnosis not present

## 2018-04-11 DIAGNOSIS — M545 Low back pain: Secondary | ICD-10-CM | POA: Diagnosis not present

## 2018-04-11 DIAGNOSIS — Z7984 Long term (current) use of oral hypoglycemic drugs: Secondary | ICD-10-CM | POA: Diagnosis not present

## 2018-04-11 DIAGNOSIS — I2510 Atherosclerotic heart disease of native coronary artery without angina pectoris: Secondary | ICD-10-CM | POA: Diagnosis not present

## 2018-04-11 DIAGNOSIS — Z87891 Personal history of nicotine dependence: Secondary | ICD-10-CM | POA: Diagnosis not present

## 2018-04-11 DIAGNOSIS — I081 Rheumatic disorders of both mitral and tricuspid valves: Secondary | ICD-10-CM | POA: Diagnosis not present

## 2018-04-11 DIAGNOSIS — R296 Repeated falls: Secondary | ICD-10-CM | POA: Diagnosis not present

## 2018-04-11 LAB — CULTURE, BLOOD (ROUTINE X 2)
CULTURE: NO GROWTH
Culture: NO GROWTH
SPECIAL REQUESTS: ADEQUATE
Special Requests: ADEQUATE

## 2018-04-13 DIAGNOSIS — E785 Hyperlipidemia, unspecified: Secondary | ICD-10-CM | POA: Diagnosis not present

## 2018-04-13 DIAGNOSIS — J449 Chronic obstructive pulmonary disease, unspecified: Secondary | ICD-10-CM | POA: Diagnosis not present

## 2018-04-13 DIAGNOSIS — Z7984 Long term (current) use of oral hypoglycemic drugs: Secondary | ICD-10-CM | POA: Diagnosis not present

## 2018-04-13 DIAGNOSIS — I700 Atherosclerosis of aorta: Secondary | ICD-10-CM | POA: Diagnosis not present

## 2018-04-13 DIAGNOSIS — I110 Hypertensive heart disease with heart failure: Secondary | ICD-10-CM | POA: Diagnosis not present

## 2018-04-13 DIAGNOSIS — L03115 Cellulitis of right lower limb: Secondary | ICD-10-CM | POA: Diagnosis not present

## 2018-04-13 DIAGNOSIS — I2510 Atherosclerotic heart disease of native coronary artery without angina pectoris: Secondary | ICD-10-CM | POA: Diagnosis not present

## 2018-04-13 DIAGNOSIS — Z7901 Long term (current) use of anticoagulants: Secondary | ICD-10-CM | POA: Diagnosis not present

## 2018-04-13 DIAGNOSIS — I4891 Unspecified atrial fibrillation: Secondary | ICD-10-CM | POA: Diagnosis not present

## 2018-04-13 DIAGNOSIS — M1990 Unspecified osteoarthritis, unspecified site: Secondary | ICD-10-CM | POA: Diagnosis not present

## 2018-04-13 DIAGNOSIS — G894 Chronic pain syndrome: Secondary | ICD-10-CM | POA: Diagnosis not present

## 2018-04-13 DIAGNOSIS — Z87891 Personal history of nicotine dependence: Secondary | ICD-10-CM | POA: Diagnosis not present

## 2018-04-13 DIAGNOSIS — I472 Ventricular tachycardia: Secondary | ICD-10-CM | POA: Diagnosis not present

## 2018-04-13 DIAGNOSIS — I5032 Chronic diastolic (congestive) heart failure: Secondary | ICD-10-CM | POA: Diagnosis not present

## 2018-04-13 DIAGNOSIS — R296 Repeated falls: Secondary | ICD-10-CM | POA: Diagnosis not present

## 2018-04-13 DIAGNOSIS — I081 Rheumatic disorders of both mitral and tricuspid valves: Secondary | ICD-10-CM | POA: Diagnosis not present

## 2018-04-13 DIAGNOSIS — E1140 Type 2 diabetes mellitus with diabetic neuropathy, unspecified: Secondary | ICD-10-CM | POA: Diagnosis not present

## 2018-04-13 DIAGNOSIS — M545 Low back pain: Secondary | ICD-10-CM | POA: Diagnosis not present

## 2018-04-13 DIAGNOSIS — Z7951 Long term (current) use of inhaled steroids: Secondary | ICD-10-CM | POA: Diagnosis not present

## 2018-04-14 ENCOUNTER — Other Ambulatory Visit: Payer: Self-pay | Admitting: *Deleted

## 2018-04-14 NOTE — Patient Outreach (Addendum)
[facility]) Care Management  04/14/2018  Mary [name] [birth year] [id number]   EMMI-  RED ON EMMI ALERT Day # 4 Date: 04/13/18 1007 Red Alert Reason: scheduled follow up?  Answer: No   Outreach attempt #1 Patient is able to verify HIPAA [place] Management RN reviewed and addressed red alert with patient She states she does have an appointment to see her primary MD Mary [name] on April 19 2018 but the automated system did not recognize her answer at the time of the call  During the call to Mary [name] she mentions that a female called ans left a her a voice message about her medications but she could not understand the message.  CM encouraged her to return a call to her MD office and [facility] office.  She confirms she has been seen already by a home health RN and PT and they left a package of information with the contact number available. She has her medications prescribed on discharge and the [facility] assisted her on 04/13/18 to clarify the correct medications with her pharmacy that delivers her medications via blister packs  Advised patient that there will be further automated EMMI- post discharge calls to assess how the patient is doing following the recent hospitalization Advised the patient that another call may be received from a nurse if any of their responses were abnormal. Patient voiced understanding and was appreciative of f/u call.  Plan: [name] RN CM will close case at this time as patient has been assessed and no needs identified.    Mary [name] L. Mary Hamman, RN, BSN, CCM [facility] Telephonic Care Management Care Coordinator Direct number [phone]  Main [facility] number [phone] Fax number [phone]

## 2018-04-18 ENCOUNTER — Other Ambulatory Visit: Payer: Self-pay | Admitting: *Deleted

## 2018-04-18 DIAGNOSIS — I700 Atherosclerosis of aorta: Secondary | ICD-10-CM | POA: Diagnosis not present

## 2018-04-18 DIAGNOSIS — Z7984 Long term (current) use of oral hypoglycemic drugs: Secondary | ICD-10-CM | POA: Diagnosis not present

## 2018-04-18 DIAGNOSIS — G894 Chronic pain syndrome: Secondary | ICD-10-CM | POA: Diagnosis not present

## 2018-04-18 DIAGNOSIS — I472 Ventricular tachycardia: Secondary | ICD-10-CM | POA: Diagnosis not present

## 2018-04-18 DIAGNOSIS — I110 Hypertensive heart disease with heart failure: Secondary | ICD-10-CM | POA: Diagnosis not present

## 2018-04-18 DIAGNOSIS — I4891 Unspecified atrial fibrillation: Secondary | ICD-10-CM | POA: Diagnosis not present

## 2018-04-18 DIAGNOSIS — L03115 Cellulitis of right lower limb: Secondary | ICD-10-CM | POA: Diagnosis not present

## 2018-04-18 DIAGNOSIS — R296 Repeated falls: Secondary | ICD-10-CM | POA: Diagnosis not present

## 2018-04-18 DIAGNOSIS — I5032 Chronic diastolic (congestive) heart failure: Secondary | ICD-10-CM | POA: Diagnosis not present

## 2018-04-18 DIAGNOSIS — J449 Chronic obstructive pulmonary disease, unspecified: Secondary | ICD-10-CM | POA: Diagnosis not present

## 2018-04-18 DIAGNOSIS — E1140 Type 2 diabetes mellitus with diabetic neuropathy, unspecified: Secondary | ICD-10-CM | POA: Diagnosis not present

## 2018-04-18 DIAGNOSIS — M1990 Unspecified osteoarthritis, unspecified site: Secondary | ICD-10-CM | POA: Diagnosis not present

## 2018-04-18 DIAGNOSIS — Z7901 Long term (current) use of anticoagulants: Secondary | ICD-10-CM | POA: Diagnosis not present

## 2018-04-18 DIAGNOSIS — Z87891 Personal history of nicotine dependence: Secondary | ICD-10-CM | POA: Diagnosis not present

## 2018-04-18 DIAGNOSIS — E785 Hyperlipidemia, unspecified: Secondary | ICD-10-CM | POA: Diagnosis not present

## 2018-04-18 DIAGNOSIS — Z7951 Long term (current) use of inhaled steroids: Secondary | ICD-10-CM | POA: Diagnosis not present

## 2018-04-18 DIAGNOSIS — M545 Low back pain: Secondary | ICD-10-CM | POA: Diagnosis not present

## 2018-04-18 DIAGNOSIS — I2510 Atherosclerotic heart disease of native coronary artery without angina pectoris: Secondary | ICD-10-CM | POA: Diagnosis not present

## 2018-04-18 DIAGNOSIS — I081 Rheumatic disorders of both mitral and tricuspid valves: Secondary | ICD-10-CM | POA: Diagnosis not present

## 2018-04-18 NOTE — Patient Outreach (Signed)
Mary [name]) Care Management  04/18/2018  Mary [name] 12-Aug-1951 [id number]   EMMI-  RED ON EMMI ALERT Day # 3 Date: 04/15/18 1:28 pm  Red Alert Reason: any new problems? Yes New/worsening problems? Yes Nausea or vomiting? Yes Tired/fatigued? Yes Given new Prescriptions to fill? Yes   Outreach attempt #1 No answer. [facility] RN CM left HIPAA compliant voicemail message along with CM's contact info.  Plan: [name] RN CM sent an unsuccessful outreach letter and scheduled this patient for another call attempt within 4 business days   Jhan Conery L. Lavina Hamman, RN, BSN, CCM [name] Telephonic Care Management Care Coordinator Direct number [phone]  Main [facility] number [phone] Fax number [phone]

## 2018-04-19 ENCOUNTER — Other Ambulatory Visit: Payer: Self-pay | Admitting: *Deleted

## 2018-04-19 DIAGNOSIS — I6529 Occlusion and stenosis of unspecified carotid artery: Secondary | ICD-10-CM | POA: Diagnosis not present

## 2018-04-19 DIAGNOSIS — I2510 Atherosclerotic heart disease of native coronary artery without angina pectoris: Secondary | ICD-10-CM | POA: Diagnosis not present

## 2018-04-19 DIAGNOSIS — A419 Sepsis, unspecified organism: Secondary | ICD-10-CM | POA: Diagnosis not present

## 2018-04-19 DIAGNOSIS — I482 Chronic atrial fibrillation: Secondary | ICD-10-CM | POA: Diagnosis not present

## 2018-04-19 DIAGNOSIS — L03115 Cellulitis of right lower limb: Secondary | ICD-10-CM | POA: Diagnosis not present

## 2018-04-19 NOTE — Patient Outreach (Signed)
[facility]) Care Management  04/19/2018  Mary [name] [birth year] [id number]   EMMI-  RED ON EMMI ALERT Day #3 Date:04/15/18 1:28 pm  Red Alert Reason:any new problems? Yes New/worsening problems? Yes Nausea or vomiting? Yes Tired/fatigued? Yes Given new Prescriptions to fill? Yes   Outreach attempt #2 successful Patient is able to verify HIPAA [place] Management RN reviewed and addressed red alert with patient Mary [name] confirmed her responses to [facility] are related to nausea, tired/fatigue and increase pain of her feet. She states she has only a small amount of time to speak with CM.  She is scheduled to leave to see Dr Merlyn Albert today April 19 2018 at 2 pm for theses symptoms   She also reports she needs to get new Rx filled with assist of her MD today She reports leaving her pill bottles x 2 in her kitchen but is now unable to find them.Marland Kitchen Her home health PT assisted her in searching hier home on April 18 2018 without success in finding the medicine. Her HH PT called the primary MD to get assist with refills to her pharmacy.   Advised patient that there will be further automated EMMI- post discharge calls to assess how the patient is doing following the recent hospitalization Advised the patient that another call may be received from a nurse if any of their responses were abnormal. Patient voiced understanding and was appreciative of f/u call.    Plan: [facility] RN CM will close case at this time as patient has been assessed, will be seen her primary MD today and no needs identified.    Kimberly L. Lavina Hamman, RN, BSN, CCM [facility] Telephonic Care Management Care Coordinator Direct number [phone]  Main [facility] number [phone] Fax number [phone]

## 2018-04-21 DIAGNOSIS — J449 Chronic obstructive pulmonary disease, unspecified: Secondary | ICD-10-CM | POA: Diagnosis not present

## 2018-04-21 DIAGNOSIS — I110 Hypertensive heart disease with heart failure: Secondary | ICD-10-CM | POA: Diagnosis not present

## 2018-04-21 DIAGNOSIS — I700 Atherosclerosis of aorta: Secondary | ICD-10-CM | POA: Diagnosis not present

## 2018-04-21 DIAGNOSIS — Z7951 Long term (current) use of inhaled steroids: Secondary | ICD-10-CM | POA: Diagnosis not present

## 2018-04-21 DIAGNOSIS — E1140 Type 2 diabetes mellitus with diabetic neuropathy, unspecified: Secondary | ICD-10-CM | POA: Diagnosis not present

## 2018-04-21 DIAGNOSIS — I4891 Unspecified atrial fibrillation: Secondary | ICD-10-CM | POA: Diagnosis not present

## 2018-04-21 DIAGNOSIS — M1990 Unspecified osteoarthritis, unspecified site: Secondary | ICD-10-CM | POA: Diagnosis not present

## 2018-04-21 DIAGNOSIS — I5032 Chronic diastolic (congestive) heart failure: Secondary | ICD-10-CM | POA: Diagnosis not present

## 2018-04-21 DIAGNOSIS — I472 Ventricular tachycardia: Secondary | ICD-10-CM | POA: Diagnosis not present

## 2018-04-21 DIAGNOSIS — G894 Chronic pain syndrome: Secondary | ICD-10-CM | POA: Diagnosis not present

## 2018-04-21 DIAGNOSIS — Z7901 Long term (current) use of anticoagulants: Secondary | ICD-10-CM | POA: Diagnosis not present

## 2018-04-21 DIAGNOSIS — E785 Hyperlipidemia, unspecified: Secondary | ICD-10-CM | POA: Diagnosis not present

## 2018-04-21 DIAGNOSIS — Z7984 Long term (current) use of oral hypoglycemic drugs: Secondary | ICD-10-CM | POA: Diagnosis not present

## 2018-04-21 DIAGNOSIS — I081 Rheumatic disorders of both mitral and tricuspid valves: Secondary | ICD-10-CM | POA: Diagnosis not present

## 2018-04-21 DIAGNOSIS — R296 Repeated falls: Secondary | ICD-10-CM | POA: Diagnosis not present

## 2018-04-21 DIAGNOSIS — I2510 Atherosclerotic heart disease of native coronary artery without angina pectoris: Secondary | ICD-10-CM | POA: Diagnosis not present

## 2018-04-21 DIAGNOSIS — L03115 Cellulitis of right lower limb: Secondary | ICD-10-CM | POA: Diagnosis not present

## 2018-04-21 DIAGNOSIS — Z87891 Personal history of nicotine dependence: Secondary | ICD-10-CM | POA: Diagnosis not present

## 2018-04-21 DIAGNOSIS — M545 Low back pain: Secondary | ICD-10-CM | POA: Diagnosis not present

## 2018-04-25 DIAGNOSIS — Z7951 Long term (current) use of inhaled steroids: Secondary | ICD-10-CM | POA: Diagnosis not present

## 2018-04-25 DIAGNOSIS — M545 Low back pain: Secondary | ICD-10-CM | POA: Diagnosis not present

## 2018-04-25 DIAGNOSIS — I110 Hypertensive heart disease with heart failure: Secondary | ICD-10-CM | POA: Diagnosis not present

## 2018-04-25 DIAGNOSIS — G894 Chronic pain syndrome: Secondary | ICD-10-CM | POA: Diagnosis not present

## 2018-04-25 DIAGNOSIS — E785 Hyperlipidemia, unspecified: Secondary | ICD-10-CM | POA: Diagnosis not present

## 2018-04-25 DIAGNOSIS — I472 Ventricular tachycardia: Secondary | ICD-10-CM | POA: Diagnosis not present

## 2018-04-25 DIAGNOSIS — Z87891 Personal history of nicotine dependence: Secondary | ICD-10-CM | POA: Diagnosis not present

## 2018-04-25 DIAGNOSIS — I700 Atherosclerosis of aorta: Secondary | ICD-10-CM | POA: Diagnosis not present

## 2018-04-25 DIAGNOSIS — I4891 Unspecified atrial fibrillation: Secondary | ICD-10-CM | POA: Diagnosis not present

## 2018-04-25 DIAGNOSIS — M1990 Unspecified osteoarthritis, unspecified site: Secondary | ICD-10-CM | POA: Diagnosis not present

## 2018-04-25 DIAGNOSIS — L03115 Cellulitis of right lower limb: Secondary | ICD-10-CM | POA: Diagnosis not present

## 2018-04-25 DIAGNOSIS — R296 Repeated falls: Secondary | ICD-10-CM | POA: Diagnosis not present

## 2018-04-25 DIAGNOSIS — J449 Chronic obstructive pulmonary disease, unspecified: Secondary | ICD-10-CM | POA: Diagnosis not present

## 2018-04-25 DIAGNOSIS — E1140 Type 2 diabetes mellitus with diabetic neuropathy, unspecified: Secondary | ICD-10-CM | POA: Diagnosis not present

## 2018-04-25 DIAGNOSIS — I2510 Atherosclerotic heart disease of native coronary artery without angina pectoris: Secondary | ICD-10-CM | POA: Diagnosis not present

## 2018-04-25 DIAGNOSIS — I081 Rheumatic disorders of both mitral and tricuspid valves: Secondary | ICD-10-CM | POA: Diagnosis not present

## 2018-04-25 DIAGNOSIS — I5032 Chronic diastolic (congestive) heart failure: Secondary | ICD-10-CM | POA: Diagnosis not present

## 2018-04-25 DIAGNOSIS — Z7984 Long term (current) use of oral hypoglycemic drugs: Secondary | ICD-10-CM | POA: Diagnosis not present

## 2018-04-25 DIAGNOSIS — Z7901 Long term (current) use of anticoagulants: Secondary | ICD-10-CM | POA: Diagnosis not present

## 2018-04-26 DIAGNOSIS — E785 Hyperlipidemia, unspecified: Secondary | ICD-10-CM | POA: Diagnosis not present

## 2018-04-26 DIAGNOSIS — I2510 Atherosclerotic heart disease of native coronary artery without angina pectoris: Secondary | ICD-10-CM | POA: Diagnosis not present

## 2018-04-26 DIAGNOSIS — M1990 Unspecified osteoarthritis, unspecified site: Secondary | ICD-10-CM | POA: Diagnosis not present

## 2018-04-26 DIAGNOSIS — I472 Ventricular tachycardia: Secondary | ICD-10-CM | POA: Diagnosis not present

## 2018-04-26 DIAGNOSIS — E1140 Type 2 diabetes mellitus with diabetic neuropathy, unspecified: Secondary | ICD-10-CM | POA: Diagnosis not present

## 2018-04-26 DIAGNOSIS — M545 Low back pain: Secondary | ICD-10-CM | POA: Diagnosis not present

## 2018-04-26 DIAGNOSIS — I110 Hypertensive heart disease with heart failure: Secondary | ICD-10-CM | POA: Diagnosis not present

## 2018-04-26 DIAGNOSIS — Z7984 Long term (current) use of oral hypoglycemic drugs: Secondary | ICD-10-CM | POA: Diagnosis not present

## 2018-04-26 DIAGNOSIS — G894 Chronic pain syndrome: Secondary | ICD-10-CM | POA: Diagnosis not present

## 2018-04-26 DIAGNOSIS — I5032 Chronic diastolic (congestive) heart failure: Secondary | ICD-10-CM | POA: Diagnosis not present

## 2018-04-26 DIAGNOSIS — I4891 Unspecified atrial fibrillation: Secondary | ICD-10-CM | POA: Diagnosis not present

## 2018-04-26 DIAGNOSIS — L03115 Cellulitis of right lower limb: Secondary | ICD-10-CM | POA: Diagnosis not present

## 2018-04-26 DIAGNOSIS — I700 Atherosclerosis of aorta: Secondary | ICD-10-CM | POA: Diagnosis not present

## 2018-04-26 DIAGNOSIS — I081 Rheumatic disorders of both mitral and tricuspid valves: Secondary | ICD-10-CM | POA: Diagnosis not present

## 2018-04-26 DIAGNOSIS — J449 Chronic obstructive pulmonary disease, unspecified: Secondary | ICD-10-CM | POA: Diagnosis not present

## 2018-04-26 DIAGNOSIS — Z87891 Personal history of nicotine dependence: Secondary | ICD-10-CM | POA: Diagnosis not present

## 2018-04-26 DIAGNOSIS — Z7951 Long term (current) use of inhaled steroids: Secondary | ICD-10-CM | POA: Diagnosis not present

## 2018-04-26 DIAGNOSIS — Z7901 Long term (current) use of anticoagulants: Secondary | ICD-10-CM | POA: Diagnosis not present

## 2018-04-26 DIAGNOSIS — R296 Repeated falls: Secondary | ICD-10-CM | POA: Diagnosis not present

## 2018-04-28 DIAGNOSIS — I472 Ventricular tachycardia: Secondary | ICD-10-CM | POA: Diagnosis not present

## 2018-04-28 DIAGNOSIS — E785 Hyperlipidemia, unspecified: Secondary | ICD-10-CM | POA: Diagnosis not present

## 2018-04-28 DIAGNOSIS — I4891 Unspecified atrial fibrillation: Secondary | ICD-10-CM | POA: Diagnosis not present

## 2018-04-28 DIAGNOSIS — M545 Low back pain: Secondary | ICD-10-CM | POA: Diagnosis not present

## 2018-04-28 DIAGNOSIS — R296 Repeated falls: Secondary | ICD-10-CM | POA: Diagnosis not present

## 2018-04-28 DIAGNOSIS — Z7901 Long term (current) use of anticoagulants: Secondary | ICD-10-CM | POA: Diagnosis not present

## 2018-04-28 DIAGNOSIS — L03115 Cellulitis of right lower limb: Secondary | ICD-10-CM | POA: Diagnosis not present

## 2018-04-28 DIAGNOSIS — I110 Hypertensive heart disease with heart failure: Secondary | ICD-10-CM | POA: Diagnosis not present

## 2018-04-28 DIAGNOSIS — J449 Chronic obstructive pulmonary disease, unspecified: Secondary | ICD-10-CM | POA: Diagnosis not present

## 2018-04-28 DIAGNOSIS — Z7951 Long term (current) use of inhaled steroids: Secondary | ICD-10-CM | POA: Diagnosis not present

## 2018-04-28 DIAGNOSIS — G894 Chronic pain syndrome: Secondary | ICD-10-CM | POA: Diagnosis not present

## 2018-04-28 DIAGNOSIS — M1990 Unspecified osteoarthritis, unspecified site: Secondary | ICD-10-CM | POA: Diagnosis not present

## 2018-04-28 DIAGNOSIS — I5032 Chronic diastolic (congestive) heart failure: Secondary | ICD-10-CM | POA: Diagnosis not present

## 2018-04-28 DIAGNOSIS — Z87891 Personal history of nicotine dependence: Secondary | ICD-10-CM | POA: Diagnosis not present

## 2018-04-28 DIAGNOSIS — I081 Rheumatic disorders of both mitral and tricuspid valves: Secondary | ICD-10-CM | POA: Diagnosis not present

## 2018-04-28 DIAGNOSIS — I700 Atherosclerosis of aorta: Secondary | ICD-10-CM | POA: Diagnosis not present

## 2018-04-28 DIAGNOSIS — Z7984 Long term (current) use of oral hypoglycemic drugs: Secondary | ICD-10-CM | POA: Diagnosis not present

## 2018-04-28 DIAGNOSIS — I2510 Atherosclerotic heart disease of native coronary artery without angina pectoris: Secondary | ICD-10-CM | POA: Diagnosis not present

## 2018-04-28 DIAGNOSIS — E1140 Type 2 diabetes mellitus with diabetic neuropathy, unspecified: Secondary | ICD-10-CM | POA: Diagnosis not present

## 2018-05-23 ENCOUNTER — Telehealth: Payer: Self-pay | Admitting: Cardiovascular Disease

## 2018-05-23 ENCOUNTER — Telehealth: Payer: Self-pay | Admitting: *Deleted

## 2018-05-23 DIAGNOSIS — R0989 Other specified symptoms and signs involving the circulatory and respiratory systems: Secondary | ICD-10-CM

## 2018-05-23 NOTE — Addendum Note (Signed)
Addended by: Meryl Crutch on: 05/23/2018 11:36 AM   Modules accepted: Orders

## 2018-05-23 NOTE — Telephone Encounter (Signed)
New message   Pt is calling she was adv that Dr. Claiborne Billings wanted her to have CT, not showing an order in the system. Pls advise.

## 2018-05-23 NOTE — Telephone Encounter (Signed)
Spoke with patient regarding carotid ultrasound.  Schedule Tursday 05/26/18 @ 10:30am at [facility] time 10:15am in radiology.  Patient voiced her understanding.

## 2018-05-23 NOTE — Telephone Encounter (Signed)
Spoke with pt who has an order for carotid dopplers. Pt states she would rather have it preformed at [facility] instead of [facility]. Current order discontinued and new order placed reflecting location changed. Message sent to scheduling to schedule.

## 2018-05-24 ENCOUNTER — Other Ambulatory Visit: Payer: Self-pay

## 2018-05-24 NOTE — Patient Outreach (Signed)
[facility]) Care Management  05/24/2018  Mary [name] 01/10/1951 [id number]   Medication Adherence call to Mary [name] spoke with patient she said doctor took her off on Lisinopril 2.5 mg. Mary [name] is showing past due under [place].  [place] Management Direct Dial [phone]  Fax [phone] [email or web address]

## 2018-05-26 ENCOUNTER — Ambulatory Visit (HOSPITAL_COMMUNITY)
Admission: RE | Admit: 2018-05-26 | Discharge: 2018-05-26 | Disposition: A | Discharge status: Home / Self Care | Payer: Medicare Other | Source: Ambulatory Visit | Attending: Cardiovascular Disease | Admitting: Cardiovascular Disease

## 2018-05-26 DIAGNOSIS — I6523 Occlusion and stenosis of bilateral carotid arteries: Secondary | ICD-10-CM | POA: Diagnosis not present

## 2018-05-26 DIAGNOSIS — E041 Nontoxic single thyroid nodule: Secondary | ICD-10-CM | POA: Diagnosis not present

## 2018-05-26 DIAGNOSIS — R0989 Other specified symptoms and signs involving the circulatory and respiratory systems: Secondary | ICD-10-CM

## 2018-05-30 ENCOUNTER — Other Ambulatory Visit: Payer: Self-pay | Admitting: Cardiovascular Disease

## 2018-06-23 ENCOUNTER — Encounter: Payer: Self-pay | Admitting: Cardiovascular Disease

## 2018-06-23 ENCOUNTER — Ambulatory Visit: Payer: Medicare Other | Admitting: Cardiovascular Disease

## 2018-06-23 ENCOUNTER — Encounter

## 2018-06-23 ENCOUNTER — Other Ambulatory Visit: Payer: Self-pay | Admitting: Cardiovascular Disease

## 2018-06-23 VITALS — BP 112/62 | HR 77 | Ht 67.0 in | Wt 118.6 lb

## 2018-06-23 DIAGNOSIS — E785 Hyperlipidemia, unspecified: Secondary | ICD-10-CM

## 2018-06-23 DIAGNOSIS — I482 Chronic atrial fibrillation: Secondary | ICD-10-CM

## 2018-06-23 DIAGNOSIS — I6523 Occlusion and stenosis of bilateral carotid arteries: Secondary | ICD-10-CM

## 2018-06-23 DIAGNOSIS — I10 Essential (primary) hypertension: Secondary | ICD-10-CM | POA: Diagnosis not present

## 2018-06-23 DIAGNOSIS — I4821 Permanent atrial fibrillation: Secondary | ICD-10-CM

## 2018-06-23 DIAGNOSIS — I2510 Atherosclerotic heart disease of native coronary artery without angina pectoris: Secondary | ICD-10-CM | POA: Diagnosis not present

## 2018-06-23 DIAGNOSIS — J449 Chronic obstructive pulmonary disease, unspecified: Secondary | ICD-10-CM

## 2018-06-23 DIAGNOSIS — I2583 Coronary atherosclerosis due to lipid rich plaque: Secondary | ICD-10-CM

## 2018-06-23 DIAGNOSIS — Z951 Presence of aortocoronary bypass graft: Secondary | ICD-10-CM | POA: Diagnosis not present

## 2018-06-23 NOTE — Progress Notes (Signed)
Patient ID: [name], female   DOB: May 24, 1951, 67 y.o.   MRN: [id number]    PCP: Dr. Wende Neighbors  HPI: Mary [name] is a 67 y.o. female who presents to the office for a 10  month followup cardiology evaluation.   Mary [name] has a history of permanent atrial fibrillation, hypertension, type 2 diabetes mellitus, COPD and long-standing prior history of tobacco use. In February 2014 Mary [name] was hospitalized and felt to have diastolic congestive heart failure. Mary [name] has been on low-dose ACE inhibitor. An echo Doppler in November 2013 showed an ejection fraction of 55-60% with grade 1 diastolic dysfunction, moderate mitral regurgitation, moderate tricuspid regurgitation, biatrial enlargement.   On 01/07/2014  Mary [name] underwent cardiac catheterization after Mary [name]  presented to the hospital with left-sided chest pain, associated with diaphoresis, weakness, nausea and vomiting.  Cardiac catheterization demonstrated 80% tapered calcified left main stenosis with 3 vessel CAD involving a subtotal proximal to mid LAD stenosis with TIMI 2 flow, an occluded circumflex vessel.  After the first large bifurcating obtuse marginal branch as well as 70% mid AV groove stenosis and a small-caliber occluded mid RCA.  Mary [name] underwent CABG revascularization surgery on 01/09/2014 by Dr. Tharon Aquas Tright and had a LIMA placed to the LAD, a saphenous vein graft to the ramus intermediate, saphenous vein graft to the circumflex marginal vessel.  Mary [name] also had application of a left atrial clip.  Preoperative ejection fraction was 30%.  In April 2015 Mary [name] was on a medical regimen, consisting of Lasix 40 mg, lisinopril 2.5 mg twice a day, digoxin, 0.125 mg, metoprolol tartrate 12.5 twice a day.  An echo Doppler study on 04/17/2014 showed improvement in Mary [name] Ejection fraction at 55-60%, although Mary [name] had a pseudo-normal left ventricular filling pattern consistent with grade 2 diastolic dysfunction.  Mild hypokinesis of the mid, anteroseptal, basal,  inferoseptal and apical septal wall.  Mitral annular calcification, mild MR, moderate LA, and mild RA dilatation with mild TR. Mary [name] had been in sinus rhythm and when last seen by me July was maintaining sinus rhythm.  At that time, I discontinued Mary [name] Lanoxin and further titrated Mary [name] metoprolol tartrate from 12.5 twice a day to 25 mg twice a day.  Postoperatively, Mary [name] had converted initially to sinus rhythm and was in normal sinus rhythm as of Mary [name] July 2015 evaluation.  A nuclear perfusion study on 01/03/2015 which revealed normal perfusion.  The study was not gated due to Mary [name] atrial fibrillation.  Apparently, Mary [name] had recently experienced a presyncopal episode.  Mary [name] was referred for MR of Mary [name] brain which raised the possibility of a small nonhemorrhagic infarct within the subcortical white matter of the right postcentral gyrus.  There also is moderate  Age advanced periventricular and subcortical T2 changes.  Mary [name] is followed by Dr. Buelah Manis. Mary [name] tells me Mary [name] is scheduled to have an EEG later this week.  An echo Doppler study in March 2017 showed an EF of 55-60%.  Mary [name] had trivial aortic insufficiency.  A peak gradient of Mary [name] aortic valve was 10 mm.  There was mild aortic regurgitation.  Mary [name] underwent carotid duplex imaging which showed heterogeneous plaque bilaterally.  There were essentially stable 40-59% right internal carotid artery stenoses and 60-79% left internal carotid artery stenoses.  Mary [name] greater than 50% bilateral external carotid stenosis.  Mary [name] had normal subclavian arteries bilaterally with antegrade flow to Mary [name] vertebrals.  Mary [name] admits to bilateral neuropathy in both lower extremities.  Mary [name] denies significant edema.  Mary [name] had had blood  work in [place] in July.  Mary [name] cholesterol was 104, triglycerides 103, HDL 38, and LDL 45.  Hemoglobin was 11.4 and hematocrit 35.7.  Mary [name] does have reduced iron saturation at 8%.   When I last saw Mary [name], Mary [name] was complaining of being under increased stress.  Mary [name] was staying  fatigue 10, was tired most of the time.  Mary [name] sleep was poor.  Mary [name] was waking up several times per night for nocturia.Mary [name] was hospitalized on 11/04/2016 through 11/06/2016 with shortness of breath.  He was felt possibly to have combination of mild COPD exacerbation and acute on chronic CHF.  He responded to IV Lasix as well as low-dose steroids and a Z-Pak and ultimately was sent home.  Mary [name] has been seen by Dr. Wende Neighbors was requested Mary [name] undergo carotid ultrasound imaging.  When I saw Mary [name] in 2018 Mary [name] had experienced issues with leg discomfortShe has had issues with peripheral neuropathy.  Carotid study in April 2018 showed heterogeneous plaque bilaterally with 40-59% right ICA stenosis and 60-79% left ICA stenosis which were stable.  Mary [name] had patent vertebral arteries with antegrade flow.  Mary [name] was hospitalized in April 2018 with cough and COPD exacerbation and initially required BiPAP metoprolol was held transiently due to hypertension.    As I last saw Mary [name] in October 2018, Mary [name] was hospitalized here every 2019 for pneumonia, in March 2019 acute respiratory failure with hypercapnia COPD with acute exacerbation and required prednisone taper and Mary [name] has permanent atrial fibrillation.  In 2019 an echo Doppler study showed an EF of 50 to 55%.  There was mild aortic regurgitation and mitral regurgitation.  Mary [name] atrium was severely dilated.  Peak PA pressure was increased at 67 mm.  Mary [name] was last hospitalized in June 2019 for 2 days at that time apparently developed sepsis secondary to right foot cellulitis.  Mary [name] is now living in a handicap apartment.  Mary [name] has been using a walker.  At times Mary [name] legs give out and Mary [name] has tended to fall.  Mary [name] denies any episodes of tachycardia.  Mary [name] denies any recent wheezing.  Mary [name] presents for evaluation.  Past Medical History:  Diagnosis Date  . Anxiety   . Arthritis   . Atrial fibrillation ([place])   . Bronchial asthma   . CHF (congestive heart failure) ([place])   . Chronic pain     Bacl pain, Disc L5-S1- Dr. Joya Salm in [place]  . COPD (chronic obstructive pulmonary disease) ([place])   . DDD (degenerative disc disease)    Radicular symptoms  . Diabetes mellitus   . History of stress test 06/2011   Abnormal myocardial perfusion study.  Marland Kitchen Hx of echocardiogram    Was interpreted by Dr Doylene Canard that showed an Ef in the 50-60% range with grade 1 diastolic dysfunction. Mary [name] had moderate MR, biatrial enlargement, moderate TR and at that time estimated RV systolic pressure was 43 mm.  . Hyperlipidemia   . Hypertension   . Neuropathy   . Shortness of breath   . Tachycardia     Past Surgical History:  Procedure Laterality Date  . ABDOMINAL HYSTERECTOMY     partial  . Breast cyst removed    . CARPAL TUNNEL RELEASE     x 2  . CORONARY ARTERY BYPASS GRAFT N/A 01/09/2014   Procedure: CORONARY ARTERY BYPASS GRAFTING (CABG) x 3 using endoscopically harvested right saphenous vein and left internal mammary artery and closure of left atrial appendage;  Surgeon: Ivin Poot, MD;  Location: [facility];  Service: Open Heart Surgery;  Laterality: N/A;  patient has preop IA BP   . INTRAOPERATIVE TRANSESOPHAGEAL ECHOCARDIOGRAM N/A 01/09/2014   Procedure: INTRAOPERATIVE TRANSESOPHAGEAL ECHOCARDIOGRAM;  Surgeon: Ivin Poot, MD;  Location: [facility];  Service: Open Heart Surgery;  Laterality: N/A;  . LEFT HEART CATHETERIZATION WITH CORONARY ANGIOGRAM N/A 01/07/2014   Procedure: LEFT HEART CATHETERIZATION WITH CORONARY ANGIOGRAM;  Surgeon: Lorretta Harp, MD;  Location: [facility] CATH LAB;  Service: Cardiovascular;  Laterality: N/A;  . TONSILLECTOMY    . TUBAL LIGATION      Allergies  Allergen Reactions  . Adhesive [Tape] Other (See Comments)    Tears skin off.   . Latex Other (See Comments)    In tape, tears skin off.  . Zocor [Simvastatin - High Dose] Nausea And Vomiting    Current Outpatient Medications  Medication Sig Dispense Refill  . acetaminophen (TYLENOL) 500 MG tablet Take 500 mg by  mouth every 6 (six) hours as needed for mild pain.    Marland Kitchen albuterol (PROVENTIL HFA;VENTOLIN HFA) 108 (90 Base) MCG/ACT inhaler Inhale 2 puffs into the lungs every 6 (six) hours as needed for shortness of breath.     Marland Kitchen apixaban (ELIQUIS) 5 MG TABS tablet Take 1 tablet (5 mg total) by mouth 2 (two) times daily. Take place of Pradaxa 60 tablet 11  . atorvastatin (LIPITOR) 40 MG tablet Take 40 mg by mouth daily.    Marland Kitchen diltiazem (CARDIZEM) 120 MG tablet Take 120 mg by mouth daily.    . DULoxetine (CYMBALTA) 30 MG capsule Take 1 capsule by mouth daily.    Marland Kitchen gabapentin (NEURONTIN) 100 MG capsule Take 100 mg by mouth every 8 (eight) hours as needed (back pain).    Marland Kitchen glipiZIDE (GLUCOTROL XL) 2.5 MG 24 hr tablet 2.5 mg daily.     . Iron-FA-B Cmp-C-Biot-Probiotic (FUSION PLUS PO) Take 1 tablet by mouth daily.    Marland Kitchen JANUVIA 100 MG tablet TAKE ONE TABLET BY MOUTH DAILY. 30 tablet 0  . levalbuterol (XOPENEX) 0.63 MG/3ML nebulizer solution Take 3 mLs (0.63 mg total) by nebulization every 6 (six) hours. 3 mL 0  . metFORMIN (GLUCOPHAGE) 850 MG tablet TAKE 1 TABLET BY MOUTH TWICE DAILY WITH MEALS. 60 tablet 0  . metoprolol tartrate (LOPRESSOR) 25 MG tablet Take 0.5 tablets (12.5 mg total) by mouth 2 (two) times daily. KEEP OV. 30 tablet 0  . montelukast (SINGULAIR) 10 MG tablet Take 1 tablet by mouth daily.    . Multiple Vitamin (MULTIVITAMIN) capsule Take 1 capsule by mouth daily.    . pantoprazole (PROTONIX) 40 MG tablet Take 40 mg by mouth daily.    . potassium chloride SA (K-DUR,KLOR-CON) 20 MEQ tablet Take 1 tablet (20 mEq total) by mouth daily. 30 tablet 0  . SYMBICORT 160-4.5 MCG/ACT inhaler INHALE 2 PUFFS INTO THE LUNGS TWICE DAILY. RINSE MOUTH AFTER USE. 10.2 g 0  . traMADol (ULTRAM) 50 MG tablet Take 1 tablet (50 mg total) by mouth every 6 (six) hours as needed for moderate pain. 15 tablet 0  . furosemide (LASIX) 40 MG tablet TAKE ONE TABLET BY MOUTH ONCE DAILY. 30 tablet 4   No current  facility-administered medications for this visit.     Social History   Socioeconomic History  . Marital status: Single    Spouse name: Not on file  . Number of children: 1  . Years of education: 55  . Highest education level: Not on file  Occupational History  . Not on file  Social Needs  . Financial resource strain: Not on file  . Food insecurity:    Worry: Not on file    Inability: Not on file  . Transportation needs:    Medical: Not on file    Non-medical: Not on file  Tobacco Use  . Smoking status: Former Smoker    Packs/day: 1.50    Years: 30.00    Pack years: 45.00    Types: Cigarettes    Last attempt to quit: 05/26/2013    Years since quitting: 5.0  . Smokeless tobacco: Never Used  . Tobacco comment: smells of heavy smoke 02/18/17  Substance and Sexual Activity  . Alcohol use: No  . Drug use: No  . Sexual activity: Yes    Birth control/protection: Surgical  Lifestyle  . Physical activity:    Days per week: Not on file    Minutes per session: Not on file  . Stress: Not on file  Relationships  . Social connections:    Talks on phone: Not on file    Gets together: Not on file    Attends religious service: Not on file    Active member of club or organization: Not on file    Attends meetings of clubs or organizations: Not on file    Relationship status: Not on file  . Intimate partner violence:    Fear of current or ex partner: Not on file    Emotionally abused: Not on file    Physically abused: Not on file    Forced sexual activity: Not on file  Other Topics Concern  . Not on file  Social History Narrative   Lives at home with sister and sister-in-law   Caffeine use : drink 1 cup coffee in morning     Socially Mary [name] is divorce. Mary [name] has one deceased child and 2 grandchildren. Mary [name] quit tobacco at the end of August 2014.  Mary [name] now has one great grandchild. There is no alcohol use. Mary [name] does not routinely exercise.  Family History  Problem Relation Age of Onset    . Diabetes Mother   . Heart disease Mother   . Diabetes Sister   . Hypertension Sister   . Hyperlipidemia Sister   . Diabetes Brother   . Heart disease Brother   . Diabetes Brother   . Heart disease Brother    ROS General: Negative; No fevers, chills, or night sweats; positive for fatigue HEENT: Negative; No changes in vision or hearing, sinus congestion, difficulty swallowing Pulmonary: Positive for mild shortness of breath; history of COPD ;No cough, wheezing, , hemoptysis Cardiovascular: Positive for palpitations.  Mild shortness of breath with activity.  No chest pressure. GI: Positive for occasional nausea, no vomiting, diarrhea, or abdominal pain GU: Negative; No dysuria, hematuria, or difficulty voiding Musculoskeletal:   intermittent leg weakness Hematologic/Oncology: Negative; no easy bruising, bleeding Endocrine: Negative; no heat/cold intolerance; positive for diabetes Neuro: Positive for peripheral neuropathy Skin: Negative; No rashes or skin lesions Psychiatric: positive for anxiety depression Sleep: Negative; No snoring, daytime sleepiness, hypersomnolence, bruxism, restless legs, hypnogognic hallucinations, no cataplexy Other comprehensive 14 point system review is negative   PE BP 112/62   Pulse 77   Ht 5' 7"  (1.702 m)   Wt 118 lb 9.6 oz (53.8 kg)   BMI 18.58 kg/m    BP by me 120/64  Wt Readings from Last 3 Encounters:  06/23/18 118 lb 9.6 oz (53.8 kg)  04/06/18 117 lb (53.1 kg)  01/12/18 117 lb 15.1 oz (  53.5 kg)   General: Alert, oriented, no distress.  Skin: normal turgor, no rashes, warm and dry HEENT: Normocephalic, atraumatic. Pupils equal round and reactive to light; sclera anicteric; extraocular muscles intact;  Nose without nasal septal hypertrophy Mouth/Parynx benign; Mallinpatti scale 3 Neck: No JVD, bilateral carotid bruits; normal carotid upstroke Lungs: clear to ausculatation and percussion; no wheezing or rales Chest wall: without  tenderness to palpitation Heart: PMI not displaced, irregular  irregular compatible with Mary [name] permanent atrial fibrillation, s1 s2 normal, 1/6 systolic murmur, no diastolic murmur, no rubs, gallops, thrills, or heaves Abdomen: soft, nontender; no hepatosplenomehaly, BS+; abdominal aorta nontender and not dilated by palpation. Back: no CVA tenderness Pulses 2+ Musculoskeletal: full range of motion, normal strength, no joint deformities Extremities: no clubbing cyanosis or edema, Homan's sign negative  Neurologic: grossly nonfocal; Cranial nerves grossly wnl Psychologic: Normal mood and affect   ECG (independently read by me): AF at 77; RBBBB with repolarization  October 2018 ECG (independently read by me): Atrial fibrillation at 67 bpm.  Right bundle branch block with repolarization changes.  T-wave abnormality inferolaterally  March 2018 ECG (independently read by me): Atrial fibrillation at 66 bpm.  Right bundle branch block with repolarization changes.  September 2017 ECG (independently read by me): Atrial fibrillation at 74 bpm with Mary [name] previously noted chronic right bundle branch lock and T-wave abnormalities.  May 2017 ECG (independently read by me): Atrial fibrillation at 75 bpm with right bundle branch block and repolarization changes  November 2016 ECG (independently read by me):  Atrial fibrillation with a controlled ventricular response at 69 bpm. Right bundle branch block.  May 2016 ECG (independently read by me):  Atrial fibrillation at 75 bpm.  Right bundle branch block with repolarization changes.  December 2015ECG (independently read by me) atrial fibrillation with a ventricular rate at 10 2 bpm.  Right bundle-branch block with repolarization changes.  Nonspecific ST changes.  09/04/2014 ECG (independently read by me): atrial fibrillation with ventricular response at 1 20 bpm with right bundle branch block, repolarization changes.  04/25/2014 ECG (independently read by me):  Normal sinus rhythm at 63 beats per minute.  Recommend followup with repolarization changes.  Prior April 2015 ECG: Normal sinus rhythm with right bundle branch block and repolarization changes.  Prior 10/04/2013 ECG: Atrial fibrillation at a rate At approximately 100-120 beats per minute. Nonspecific ST changes.  LABS: I reviewed lab work done by Dr. Wende Neighbors  In 2018.  Total cholesterol 128, HDL 57, triglycerides 85, LDL 54. Mary [name] had been on steroids, Mary [name] glucose was elevated at 151.  Mary [name] hemoglobin A1c had increased to 6.7.  BMP Latest Ref Rng & Units 04/08/2018 04/07/2018 04/06/2018  Glucose 65 - 99 mg/dL 116(H) 147(H) 184(H)  BUN 6 - 20 mg/dL 9 19 31(H)  Creatinine 0.44 - 1.00 mg/dL 0.67 0.80 1.18(H)  Sodium 135 - 145 mmol/L 138 135 128(L)  Potassium 3.5 - 5.1 mmol/L 4.1 4.0 5.5(H)  Chloride 101 - 111 mmol/L 103 99(L) 89(L)  CO2 22 - 32 mmol/L 28 29 28   Calcium 8.9 - 10.3 mg/dL 8.9 9.1 9.8   Hepatic Function Latest Ref Rng & Units 04/06/2018 01/12/2018 01/11/2018  Total Protein 6.5 - 8.1 g/dL 7.2 6.0(L) 6.1(L)  Albumin 3.5 - 5.0 g/dL 4.2 3.1(L) 3.1(L)  AST 15 - 41 U/L 34 30 51(H)  ALT 14 - 54 U/L 24 103(H) 135(H)  Alk Phosphatase 38 - 126 U/L 59 60 60  Total Bilirubin 0.3 - 1.2 mg/dL 0.9 0.8  0.8  Bilirubin, Direct 0.0 - 0.5 mg/dL - - -   CBC Latest Ref Rng & Units 04/08/2018 04/07/2018 04/06/2018  WBC 4.0 - 10.5 K/uL 6.2 6.5 9.5  Hemoglobin 12.0 - 15.0 g/dL 8.4(L) 8.8(L) 10.2(L)  Hematocrit 36.0 - 46.0 % 25.8(L) 26.7(L) 31.1(L)  Platelets 150 - 400 K/uL 132(L) 128(L) 140(L)   Lab Results  Component Value Date   MCV 90.2 04/08/2018   MCV 89.6 04/07/2018   MCV 89.4 04/06/2018   Lab Results  Component Value Date   TSH 0.698 05/15/2014    Lab Results  Component Value Date   HGBA1C 6.7 (H) 04/06/2018   Lipid Panel     Component Value Date/Time   CHOL 186 09/28/2014 1115   TRIG 144 09/28/2014 1115   HDL 55 09/28/2014 1115   CHOLHDL 3.4 09/28/2014 1115   VLDL 29  09/28/2014 1115   LDLCALC 102 (H) 09/28/2014 1115   LDLDIRECT 66 04/14/2012 1200    RADIOLOGY: No results found.  IMPRESSION:  1. Bilateral carotid artery stenosis   2. Permanent atrial fibrillation ([place])   3. Coronary artery disease due to lipid rich plaque   4. Hx of CABG   5. Essential hypertension   6. Hyperlipidemia with target LDL less than 70   7. Chronic obstructive pulmonary disease, unspecified COPD type ([place])     ASSESSMENT AND PLAN: Ms. Ziehm is a 67 year old Caucasian female who has permanent atrial fibrillation and previously was on Pradaxa and most recently is on Eliquis for anticoagulation. Mary [name] has a history of significant prior tobacco use, but fortunately Mary [name] has quit smoking.  On 01/09/2014 Mary [name] underwent CABG surgery for severe multivessel CAD, including left main stenosis.  At that time, Mary [name] did have a left atrial clip.  Preoperatively, Mary [name] ejection fraction was 30%.  Postoperatively, Mary [name] ejection fraction had improved to 55-60%.  Reviewed Mary [name] most recent echo Doppler data which was done in March 2019 which again showed normal systolic function with severe biatrial enlargement secondary to Mary [name] AF mild AR, mild MR, moderate TR and increased PA pressure at 67 mm.  Mary [name] will fibrillation rate is well controlled on Mary [name] current therapy dosing of diltiazem 120 mg and metoprolol 12.5 mg twice a day.  Is on Eliquis and tolerating 5 mg twice a day without bleeding.  Is on atorvastatin with target LDL less than 70 for Mary [name] CAD.  He is diabetic on Januvia, metformin, and glipizide.  He is to take Symbicort for Mary [name] COPD.  There is no wheezing today.  Mary [name] has bilateral carotid bruits.  Had undergone recent carotid duplex [facility] May 26, 2018 which showed less than 50% stenosis in the right internal carotid artery and greater than 70% stenosis in the left internal carotid.  I will repeat carotid studies in 6 months.  Mary [name] will follow-up with Dr. Nevada Crane.  I will see Mary [name] in 6 months  for reevaluation.   Time spent: 25 minutes  Troy Sine, MD, [facility]  06/25/2018 2:14 PM

## 2018-06-23 NOTE — Patient Instructions (Signed)
Medication Instructions:  Your physician recommends that you continue on your current medications as directed. Please refer to the Current Medication list given to you today.  Testing/Procedures: Your physician has requested that you have a carotid duplex in 6 MONTHS. This test is an ultrasound of the carotid arteries in your neck. It looks at blood flow through these arteries that supply the brain with blood. Allow one hour for this exam. There are no restrictions or special instructions.  Follow-Up: Your physician wants you to follow-up in: 6 months (after testing) with Dr. Claiborne Billings.  You will receive a reminder letter in the mail two months in advance. If you don't receive a letter, please call our office to schedule the follow-up appointment.   Any Other Special Instructions Will Be Listed Below (If Applicable).     If you need a refill on your cardiac medications before your next appointment, please call your pharmacy.

## 2018-06-25 ENCOUNTER — Encounter: Payer: Self-pay | Admitting: Cardiovascular Disease

## 2018-07-15 DIAGNOSIS — J441 Chronic obstructive pulmonary disease with (acute) exacerbation: Secondary | ICD-10-CM | POA: Diagnosis not present

## 2018-07-15 DIAGNOSIS — R0981 Nasal congestion: Secondary | ICD-10-CM | POA: Diagnosis not present

## 2018-07-15 DIAGNOSIS — R05 Cough: Secondary | ICD-10-CM | POA: Diagnosis not present

## 2018-07-21 DIAGNOSIS — E782 Mixed hyperlipidemia: Secondary | ICD-10-CM | POA: Diagnosis not present

## 2018-07-21 DIAGNOSIS — E1140 Type 2 diabetes mellitus with diabetic neuropathy, unspecified: Secondary | ICD-10-CM | POA: Diagnosis not present

## 2018-07-21 DIAGNOSIS — I959 Hypotension, unspecified: Secondary | ICD-10-CM | POA: Diagnosis not present

## 2018-07-22 DIAGNOSIS — Z0000 Encounter for general adult medical examination without abnormal findings: Secondary | ICD-10-CM | POA: Diagnosis not present

## 2018-07-25 ENCOUNTER — Other Ambulatory Visit: Payer: Self-pay | Admitting: Cardiovascular Disease

## 2018-07-27 DIAGNOSIS — I5022 Chronic systolic (congestive) heart failure: Secondary | ICD-10-CM | POA: Diagnosis not present

## 2018-07-27 DIAGNOSIS — D509 Iron deficiency anemia, unspecified: Secondary | ICD-10-CM | POA: Diagnosis not present

## 2018-07-27 DIAGNOSIS — E1140 Type 2 diabetes mellitus with diabetic neuropathy, unspecified: Secondary | ICD-10-CM | POA: Diagnosis not present

## 2018-07-27 DIAGNOSIS — E782 Mixed hyperlipidemia: Secondary | ICD-10-CM | POA: Diagnosis not present

## 2018-08-24 ENCOUNTER — Other Ambulatory Visit: Payer: Self-pay | Admitting: Family Medicine

## 2018-09-01 ENCOUNTER — Telehealth: Payer: Self-pay | Admitting: Cardiovascular Disease

## 2018-09-01 NOTE — Telephone Encounter (Signed)
  Needs diagnosis and EF percentage. She is faxing signed information release over.

## 2018-09-01 NOTE — Telephone Encounter (Signed)
Left message to call back  

## 2018-09-13 DIAGNOSIS — M545 Low back pain: Secondary | ICD-10-CM | POA: Diagnosis not present

## 2018-09-13 DIAGNOSIS — M25551 Pain in right hip: Secondary | ICD-10-CM | POA: Diagnosis not present

## 2018-09-13 DIAGNOSIS — R0782 Intercostal pain: Secondary | ICD-10-CM | POA: Diagnosis not present

## 2018-09-13 DIAGNOSIS — I4891 Unspecified atrial fibrillation: Secondary | ICD-10-CM | POA: Diagnosis not present

## 2018-09-13 DIAGNOSIS — G9009 Other idiopathic peripheral autonomic neuropathy: Secondary | ICD-10-CM | POA: Diagnosis not present

## 2018-09-26 DIAGNOSIS — J449 Chronic obstructive pulmonary disease, unspecified: Secondary | ICD-10-CM | POA: Diagnosis not present

## 2018-09-26 DIAGNOSIS — I959 Hypotension, unspecified: Secondary | ICD-10-CM | POA: Diagnosis not present

## 2018-09-26 DIAGNOSIS — E1140 Type 2 diabetes mellitus with diabetic neuropathy, unspecified: Secondary | ICD-10-CM | POA: Diagnosis not present

## 2018-09-26 DIAGNOSIS — I4891 Unspecified atrial fibrillation: Secondary | ICD-10-CM | POA: Diagnosis not present

## 2018-09-26 DIAGNOSIS — I509 Heart failure, unspecified: Secondary | ICD-10-CM | POA: Diagnosis not present

## 2018-09-27 ENCOUNTER — Other Ambulatory Visit: Payer: Self-pay | Admitting: Cardiovascular Disease

## 2018-10-20 ENCOUNTER — Other Ambulatory Visit: Payer: Self-pay | Admitting: Cardiovascular Disease

## 2018-11-03 ENCOUNTER — Other Ambulatory Visit: Payer: Self-pay

## 2018-11-03 ENCOUNTER — Encounter (HOSPITAL_COMMUNITY): Payer: Self-pay | Admitting: Emergency Medicine

## 2018-11-03 ENCOUNTER — Inpatient Hospital Stay (HOSPITAL_COMMUNITY)
Admission: EM | Admit: 2018-11-03 | Discharge: 2018-11-06 | DRG: 190 | Disposition: A | Discharge status: Home Health | Payer: Medicare Other | Attending: Internal Medicine | Admitting: Internal Medicine

## 2018-11-03 ENCOUNTER — Observation Stay (HOSPITAL_COMMUNITY): Payer: Medicare Other

## 2018-11-03 ENCOUNTER — Emergency Department (HOSPITAL_COMMUNITY): Payer: Medicare Other

## 2018-11-03 DIAGNOSIS — E119 Type 2 diabetes mellitus without complications: Secondary | ICD-10-CM | POA: Diagnosis not present

## 2018-11-03 DIAGNOSIS — Z66 Do not resuscitate: Secondary | ICD-10-CM | POA: Diagnosis not present

## 2018-11-03 DIAGNOSIS — I5032 Chronic diastolic (congestive) heart failure: Secondary | ICD-10-CM | POA: Diagnosis not present

## 2018-11-03 DIAGNOSIS — Z833 Family history of diabetes mellitus: Secondary | ICD-10-CM

## 2018-11-03 DIAGNOSIS — I110 Hypertensive heart disease with heart failure: Secondary | ICD-10-CM | POA: Diagnosis present

## 2018-11-03 DIAGNOSIS — I2510 Atherosclerotic heart disease of native coronary artery without angina pectoris: Secondary | ICD-10-CM | POA: Diagnosis present

## 2018-11-03 DIAGNOSIS — E785 Hyperlipidemia, unspecified: Secondary | ICD-10-CM | POA: Diagnosis present

## 2018-11-03 DIAGNOSIS — R0781 Pleurodynia: Secondary | ICD-10-CM

## 2018-11-03 DIAGNOSIS — Z9981 Dependence on supplemental oxygen: Secondary | ICD-10-CM

## 2018-11-03 DIAGNOSIS — R296 Repeated falls: Secondary | ICD-10-CM | POA: Diagnosis present

## 2018-11-03 DIAGNOSIS — J449 Chronic obstructive pulmonary disease, unspecified: Secondary | ICD-10-CM | POA: Diagnosis present

## 2018-11-03 DIAGNOSIS — J441 Chronic obstructive pulmonary disease with (acute) exacerbation: Secondary | ICD-10-CM | POA: Diagnosis not present

## 2018-11-03 DIAGNOSIS — Z8349 Family history of other endocrine, nutritional and metabolic diseases: Secondary | ICD-10-CM | POA: Diagnosis not present

## 2018-11-03 DIAGNOSIS — Z7982 Long term (current) use of aspirin: Secondary | ICD-10-CM | POA: Diagnosis not present

## 2018-11-03 DIAGNOSIS — S2241XA Multiple fractures of ribs, right side, initial encounter for closed fracture: Secondary | ICD-10-CM | POA: Diagnosis present

## 2018-11-03 DIAGNOSIS — J9621 Acute and chronic respiratory failure with hypoxia: Secondary | ICD-10-CM

## 2018-11-03 DIAGNOSIS — I4821 Permanent atrial fibrillation: Secondary | ICD-10-CM | POA: Diagnosis not present

## 2018-11-03 DIAGNOSIS — J189 Pneumonia, unspecified organism: Secondary | ICD-10-CM | POA: Diagnosis not present

## 2018-11-03 DIAGNOSIS — Z7951 Long term (current) use of inhaled steroids: Secondary | ICD-10-CM

## 2018-11-03 DIAGNOSIS — E1141 Type 2 diabetes mellitus with diabetic mononeuropathy: Secondary | ICD-10-CM | POA: Diagnosis not present

## 2018-11-03 DIAGNOSIS — Z951 Presence of aortocoronary bypass graft: Secondary | ICD-10-CM

## 2018-11-03 DIAGNOSIS — W19XXXA Unspecified fall, initial encounter: Secondary | ICD-10-CM | POA: Diagnosis present

## 2018-11-03 DIAGNOSIS — I4891 Unspecified atrial fibrillation: Secondary | ICD-10-CM | POA: Diagnosis present

## 2018-11-03 DIAGNOSIS — J440 Chronic obstructive pulmonary disease with acute lower respiratory infection: Secondary | ICD-10-CM | POA: Diagnosis not present

## 2018-11-03 DIAGNOSIS — I10 Essential (primary) hypertension: Secondary | ICD-10-CM | POA: Diagnosis not present

## 2018-11-03 DIAGNOSIS — Z8249 Family history of ischemic heart disease and other diseases of the circulatory system: Secondary | ICD-10-CM

## 2018-11-03 DIAGNOSIS — R079 Chest pain, unspecified: Secondary | ICD-10-CM | POA: Diagnosis not present

## 2018-11-03 DIAGNOSIS — Z7984 Long term (current) use of oral hypoglycemic drugs: Secondary | ICD-10-CM

## 2018-11-03 DIAGNOSIS — I5030 Unspecified diastolic (congestive) heart failure: Secondary | ICD-10-CM | POA: Diagnosis present

## 2018-11-03 DIAGNOSIS — J9690 Respiratory failure, unspecified, unspecified whether with hypoxia or hypercapnia: Secondary | ICD-10-CM | POA: Diagnosis present

## 2018-11-03 DIAGNOSIS — S299XXA Unspecified injury of thorax, initial encounter: Secondary | ICD-10-CM | POA: Diagnosis not present

## 2018-11-03 DIAGNOSIS — F17210 Nicotine dependence, cigarettes, uncomplicated: Secondary | ICD-10-CM | POA: Diagnosis present

## 2018-11-03 DIAGNOSIS — R05 Cough: Secondary | ICD-10-CM | POA: Diagnosis not present

## 2018-11-03 DIAGNOSIS — Y92009 Unspecified place in unspecified non-institutional (private) residence as the place of occurrence of the external cause: Secondary | ICD-10-CM

## 2018-11-03 LAB — CBC WITH DIFFERENTIAL/PLATELET
Abs Immature Granulocytes: 0.12 10*3/uL — ABNORMAL HIGH (ref 0.00–0.07)
BASOS ABS: 0 10*3/uL (ref 0.0–0.1)
BASOS PCT: 0 %
EOS PCT: 0 %
Eosinophils Absolute: 0 10*3/uL (ref 0.0–0.5)
HCT: 29 % — ABNORMAL LOW (ref 36.0–46.0)
Hemoglobin: 9 g/dL — ABNORMAL LOW (ref 12.0–15.0)
Immature Granulocytes: 1 %
LYMPHS PCT: 9 %
Lymphs Abs: 1.4 10*3/uL (ref 0.7–4.0)
MCH: 28.8 pg (ref 26.0–34.0)
MCHC: 31 g/dL (ref 30.0–36.0)
MCV: 92.7 fL (ref 80.0–100.0)
MONO ABS: 1.4 10*3/uL — AB (ref 0.1–1.0)
Monocytes Relative: 9 %
NEUTROS ABS: 11.8 10*3/uL — AB (ref 1.7–7.7)
NRBC: 0.1 % (ref 0.0–0.2)
Neutrophils Relative %: 81 %
PLATELETS: 305 10*3/uL (ref 150–400)
RBC: 3.13 MIL/uL — AB (ref 3.87–5.11)
RDW: 15.9 % — ABNORMAL HIGH (ref 11.5–15.5)
WBC: 14.7 10*3/uL — AB (ref 4.0–10.5)

## 2018-11-03 LAB — COMPREHENSIVE METABOLIC PANEL
ALBUMIN: 3.4 g/dL — AB (ref 3.5–5.0)
ALT: 15 U/L (ref 0–44)
AST: 20 U/L (ref 15–41)
Alkaline Phosphatase: 57 U/L (ref 38–126)
Anion gap: 11 (ref 5–15)
BUN: 22 mg/dL (ref 8–23)
CHLORIDE: 97 mmol/L — AB (ref 98–111)
CO2: 27 mmol/L (ref 22–32)
Calcium: 8.8 mg/dL — ABNORMAL LOW (ref 8.9–10.3)
Creatinine, Ser: 0.9 mg/dL (ref 0.44–1.00)
GFR calc non Af Amer: >60 mL/min (ref >60)
GLUCOSE: 91 mg/dL (ref 70–99)
POTASSIUM: 3.8 mmol/L (ref 3.5–5.1)
SODIUM: 135 mmol/L (ref 135–145)
Total Bilirubin: 0.5 mg/dL (ref 0.3–1.2)
Total Protein: 7.5 g/dL (ref 6.5–8.1)

## 2018-11-03 LAB — GLUCOSE, CAPILLARY
GLUCOSE-CAPILLARY: 125 mg/dL — AB (ref 70–99)
Glucose-Capillary: 79 mg/dL (ref 70–99)

## 2018-11-03 MED ORDER — GUAIFENESIN ER 600 MG PO TB12
600.0000 mg | ORAL_TABLET | Freq: Two times a day (BID) | ORAL | Status: DC
  Administered 2018-11-03: 600 mg via ORAL
  Filled 2018-11-03 (×4): qty 1

## 2018-11-03 MED ORDER — INSULIN ASPART 100 UNIT/ML ~~LOC~~ SOLN
0.0000 [IU] | Freq: Three times a day (TID) | SUBCUTANEOUS | Status: DC
  Administered 2018-11-04: 3 [IU] via SUBCUTANEOUS
  Administered 2018-11-05: 9 [IU] via SUBCUTANEOUS
  Administered 2018-11-05: 2 [IU] via SUBCUTANEOUS
  Administered 2018-11-05: 5 [IU] via SUBCUTANEOUS
  Administered 2018-11-06: 2 [IU] via SUBCUTANEOUS
  Administered 2018-11-06: 9 [IU] via SUBCUTANEOUS

## 2018-11-03 MED ORDER — GABAPENTIN 100 MG PO CAPS: 100.0000 mg | ORAL_CAPSULE | Freq: Three times a day (TID) | ORAL | Status: DC | PRN

## 2018-11-03 MED ORDER — METOPROLOL TARTRATE 25 MG PO TABS
12.5000 mg | ORAL_TABLET | Freq: Two times a day (BID) | ORAL | Status: DC
  Administered 2018-11-03 – 2018-11-06 (×7): 12.5 mg via ORAL
  Filled 2018-11-03 (×7): qty 1

## 2018-11-03 MED ORDER — HYDROCODONE-ACETAMINOPHEN 5-325 MG PO TABS: 1.0000 | ORAL_TABLET | Freq: Four times a day (QID) | ORAL | 0 refills | Status: DC | PRN

## 2018-11-03 MED ORDER — OXYCODONE HCL 5 MG PO TABS
5.0000 mg | ORAL_TABLET | Freq: Four times a day (QID) | ORAL | Status: DC | PRN
  Administered 2018-11-03 – 2018-11-06 (×7): 5 mg via ORAL
  Filled 2018-11-03 (×7): qty 1

## 2018-11-03 MED ORDER — ATORVASTATIN CALCIUM 40 MG PO TABS
40.0000 mg | ORAL_TABLET | Freq: Every day | ORAL | Status: DC
  Administered 2018-11-04 – 2018-11-06 (×3): 40 mg via ORAL
  Filled 2018-11-03 (×3): qty 1

## 2018-11-03 MED ORDER — SODIUM CHLORIDE 0.9 % IV SOLN
500.0000 mg | INTRAVENOUS | Status: DC
  Administered 2018-11-05: 500 mg via INTRAVENOUS
  Filled 2018-11-03 (×3): qty 500

## 2018-11-03 MED ORDER — ACETAMINOPHEN 650 MG RE SUPP: 650.0000 mg | Freq: Four times a day (QID) | RECTAL | Status: DC | PRN

## 2018-11-03 MED ORDER — ALBUTEROL SULFATE (2.5 MG/3ML) 0.083% IN NEBU
2.5000 mg | INHALATION_SOLUTION | Freq: Once | RESPIRATORY_TRACT | Status: AC
  Administered 2018-11-03: 2.5 mg via RESPIRATORY_TRACT
  Filled 2018-11-03: qty 3

## 2018-11-03 MED ORDER — LEVOFLOXACIN IN D5W 500 MG/100ML IV SOLN
500.0000 mg | Freq: Once | INTRAVENOUS | Status: AC
  Administered 2018-11-03: 500 mg via INTRAVENOUS
  Filled 2018-11-03: qty 100

## 2018-11-03 MED ORDER — IPRATROPIUM-ALBUTEROL 0.5-2.5 (3) MG/3ML IN SOLN
3.0000 mL | Freq: Four times a day (QID) | RESPIRATORY_TRACT | Status: DC
  Administered 2018-11-03 – 2018-11-04 (×3): 3 mL via RESPIRATORY_TRACT
  Filled 2018-11-03 (×3): qty 3

## 2018-11-03 MED ORDER — ASPIRIN EC 81 MG PO TBEC
81.0000 mg | DELAYED_RELEASE_TABLET | Freq: Every day | ORAL | Status: DC
  Administered 2018-11-04 – 2018-11-06 (×3): 81 mg via ORAL
  Filled 2018-11-03 (×3): qty 1

## 2018-11-03 MED ORDER — ORAL CARE MOUTH RINSE
15.0000 mL | Freq: Two times a day (BID) | OROMUCOSAL | Status: DC
  Administered 2018-11-03 – 2018-11-05 (×4): 15 mL via OROMUCOSAL

## 2018-11-03 MED ORDER — ENSURE ENLIVE PO LIQD
237.0000 mL | Freq: Two times a day (BID) | ORAL | Status: DC
  Administered 2018-11-04 – 2018-11-06 (×4): 237 mL via ORAL

## 2018-11-03 MED ORDER — ACETAMINOPHEN 325 MG PO TABS: 650.0000 mg | ORAL_TABLET | Freq: Four times a day (QID) | ORAL | Status: DC | PRN

## 2018-11-03 MED ORDER — DILTIAZEM HCL 30 MG PO TABS: 120.0000 mg | ORAL_TABLET | Freq: Every day | ORAL | Status: DC

## 2018-11-03 MED ORDER — IPRATROPIUM-ALBUTEROL 0.5-2.5 (3) MG/3ML IN SOLN
3.0000 mL | Freq: Once | RESPIRATORY_TRACT | Status: AC
  Administered 2018-11-03: 3 mL via RESPIRATORY_TRACT
  Filled 2018-11-03: qty 3

## 2018-11-03 MED ORDER — ALBUTEROL SULFATE (2.5 MG/3ML) 0.083% IN NEBU: 2.5000 mg | INHALATION_SOLUTION | RESPIRATORY_TRACT | Status: DC | PRN

## 2018-11-03 MED ORDER — ADULT MULTIVITAMIN W/MINERALS CH
1.0000 | ORAL_TABLET | Freq: Every day | ORAL | Status: DC
  Administered 2018-11-04 – 2018-11-06 (×3): 1 via ORAL
  Filled 2018-11-03 (×3): qty 1

## 2018-11-03 MED ORDER — SODIUM CHLORIDE 0.9 % IV SOLN
1.0000 g | INTRAVENOUS | Status: DC
  Administered 2018-11-04 – 2018-11-06 (×3): 1 g via INTRAVENOUS
  Filled 2018-11-03 (×3): qty 1
  Filled 2018-11-03: qty 10

## 2018-11-03 NOTE — ED Provider Notes (Signed)
[facility] EMERGENCY DEPARTMENT Provider Note   CSN: [id number] Arrival date & time: 11/03/18  1319     History   Chief Complaint Chief Complaint  Patient presents with  . Fall    HPI Mary [name] is a 68 y.o. female.  Patient complains of cough for 6 weeks and shortness of breath.  She has a history of COPD  The history is provided by the patient. No language interpreter was used.  Cough  Cough characteristics:  Productive Sputum characteristics:  Green Severity:  Moderate Onset quality:  Sudden Timing:  Constant Chronicity:  New Smoker: yes   Context: not animal exposure   Relieved by:  Nothing Worsened by:  Nothing Ineffective treatments:  None tried Associated symptoms: chest pain   Associated symptoms: no eye discharge, no headaches and no rash     Past Medical History:  Diagnosis Date  . Anxiety   . Arthritis   . Atrial fibrillation ([place])   . Bronchial asthma   . CHF (congestive heart failure) ([place])   . Chronic pain    Bacl pain, Disc L5-S1- Dr. Joya Salm in [place]  . COPD (chronic obstructive pulmonary disease) ([place])   . DDD (degenerative disc disease)    Radicular symptoms  . Diabetes mellitus   . History of stress test 06/2011   Abnormal myocardial perfusion study.  Marland Kitchen Hx of echocardiogram    Was interpreted by Dr Doylene Canard that showed an Ef in the 50-60% range with grade 1 diastolic dysfunction. she had moderate MR, biatrial enlargement, moderate TR and at that time estimated RV systolic pressure was 43 mm.  . Hyperlipidemia   . Hypertension   . Neuropathy   . Shortness of breath   . Tachycardia     Patient Active Problem List   Diagnosis Date Noted  . Atrial fibrillation with RVR ([place]) 04/06/2018  . Cellulitis in diabetic foot ([place]) 04/06/2018  . Acute on chronic diastolic CHF (congestive heart failure) ([place]) 01/10/2018  . Sepsis ([place]) 01/08/2018  . AKI (acute kidney injury) ([place]) 01/08/2018  . Acute metabolic encephalopathy [id number]/42/[id number]  . Acute  respiratory failure with hypoxia and hypercarbia ([place]) 01/08/2018  . Transaminasemia 01/08/2018  . Acute respiratory failure with hypercapnia ([place]) 01/07/2018  . Hyponatremia 01/07/2018  . Anemia 01/07/2018  . Lobar pneumonia ([place]) 11/16/2017  . Chronic diastolic CHF (congestive heart failure) ([place]) 11/16/2017  . Acute respiratory failure with hypoxia ([place]) 11/15/2017  . Malnutrition of moderate degree 02/20/2017  . Acute on chronic respiratory failure with hypoxia ([place]) 02/18/2017  . Hypomagnesemia 02/18/2017  . COPD with acute exacerbation ([place]) 11/04/2016  . Left carotid bruit 09/13/2015  . Noncompliance with medications 03/19/2015  . Gastroenteritis 03/19/2015  . Seasonal allergies 01/25/2015  . Chest pain 05/16/2014  . HTN (hypertension) 05/16/2014  . PSVT (paroxysmal supraventricular tachycardia) ([place]) 05/16/2014  . Acute diastolic CHF (congestive heart failure) ([place]) 05/15/2014  . CAD (coronary artery disease) 05/15/2014  . RBBB 04/25/2014  . Benign skin lesion 03/07/2014  . 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 Atrial fibrillation with rapid ventricular response ([place]) 01/06/2014  . Leukocytosis 01/06/2014  . Fall at home 09/18/2013  . Hypotension 09/18/2013  . Recurrent falls 09/18/2013  . Tinnitus of left ear 02/19/2013  . Altered mental status 12/06/2012  . Diastolic congestive heart failure ([place]) 09/10/2012  . Atrial fibrillation ([place]) 09/10/2012  . DDD (degenerative disc disease), lumbar 08/26/2012  . Atypical mole 04/14/2012  . Seborrheic keratosis 04/14/2012  . Depression 12/09/2011  . COPD (  chronic obstructive pulmonary disease) ([place]) 06/27/2011  . Anxiety 06/27/2011  . Chronic pain 05/27/2011  . Diabetes mellitus ([place]) 05/26/2011  . Hyperlipidemia 05/26/2011    Past Surgical History:  Procedure Laterality Date  . ABDOMINAL HYSTERECTOMY     partial  . Breast cyst removed    . CARPAL TUNNEL RELEASE     x 2  . CORONARY ARTERY BYPASS GRAFT N/A 01/09/2014    Procedure: CORONARY ARTERY BYPASS GRAFTING (CABG) x 3 using endoscopically harvested right saphenous vein and left internal mammary artery and closure of left atrial appendage;  Surgeon: Ivin Poot, MD;  Location: [place];  Service: Open Heart Surgery;  Laterality: N/A;  patient has preop IA BP   . INTRAOPERATIVE TRANSESOPHAGEAL ECHOCARDIOGRAM N/A 01/09/2014   Procedure: INTRAOPERATIVE TRANSESOPHAGEAL ECHOCARDIOGRAM;  Surgeon: Ivin Poot, MD;  Location: [place];  Service: Open Heart Surgery;  Laterality: N/A;  . LEFT HEART CATHETERIZATION WITH CORONARY ANGIOGRAM N/A 01/07/2014   Procedure: LEFT HEART CATHETERIZATION WITH CORONARY ANGIOGRAM;  Surgeon: Lorretta Harp, MD;  Location: [facility] CATH LAB;  Service: Cardiovascular;  Laterality: N/A;  . TONSILLECTOMY    . TUBAL LIGATION       OB History    Gravida  1   Para  1   Term  1   Preterm      AB      Living  0     SAB      TAB      Ectopic      Multiple      Live Births               Home Medications    Prior to Admission medications   Medication Sig Start Date End Date Taking? Authorizing Provider  acetaminophen (TYLENOL) 500 MG tablet Take 500 mg by mouth every 6 (six) hours as needed for mild pain.    [provider]  albuterol (PROVENTIL HFA;VENTOLIN HFA) 108 (90 Base) MCG/ACT inhaler Inhale 2 puffs into the lungs every 6 (six) hours as needed for shortness of breath.     [provider]  atorvastatin (LIPITOR) 40 MG tablet TAKE ONE TABLET BY MOUTH DAILY. 07/25/18   Troy Sine, MD  diltiazem (CARDIZEM) 120 MG tablet Take 120 mg by mouth daily.    [provider]  DULoxetine (CYMBALTA) 30 MG capsule Take 1 capsule by mouth daily. 11/01/17   [provider]  ELIQUIS 5 MG TABS tablet TAKE (1) TABLET BY MOUTH TWICE DAILY. 10/20/18   Troy Sine, MD  furosemide (LASIX) 40 MG tablet TAKE ONE TABLET BY MOUTH ONCE DAILY. 06/23/18   Troy Sine, MD  gabapentin (NEURONTIN) 100  MG capsule Take 100 mg by mouth every 8 (eight) hours as needed (back pain).    [provider]  glipiZIDE (GLUCOTROL XL) 2.5 MG 24 hr tablet 2.5 mg daily.  02/28/16   [provider]  Iron-FA-B Cmp-C-Biot-Probiotic (FUSION PLUS PO) Take 1 tablet by mouth daily.    [provider]  JANUVIA 100 MG tablet TAKE ONE TABLET BY MOUTH DAILY. 07/19/15   [place], Modena Nunnery, MD  levalbuterol Penne Lash) 0.63 MG/3ML nebulizer solution Take 3 mLs (0.63 mg total) by nebulization every 6 (six) hours. 02/24/17   Eber Jones, MD  metFORMIN (GLUCOPHAGE) 850 MG tablet TAKE 1 TABLET BY MOUTH TWICE DAILY WITH MEALS. 07/19/15   Alycia Rossetti, MD  metoprolol tartrate (LOPRESSOR) 25 MG tablet Take 0.5 tablets (12.5 mg total)  by mouth 2 (two) times daily. KEEP OV. 05/30/18   Troy Sine, MD  montelukast (SINGULAIR) 10 MG tablet Take 1 tablet by mouth daily. 01/21/17   [provider]  Multiple Vitamin (MULTIVITAMIN) capsule Take 1 capsule by mouth daily.    [provider]  pantoprazole (PROTONIX) 40 MG tablet Take 40 mg by mouth daily.    [provider]  potassium chloride SA (K-DUR,KLOR-CON) 20 MEQ tablet Take 1 tablet (20 mEq total) by mouth daily. 11/06/16   Thurnell Lose, MD  SYMBICORT 160-4.5 MCG/ACT inhaler INHALE 2 PUFFS INTO THE LUNGS TWICE DAILY. RINSE MOUTH AFTER USE. 06/04/15   Alycia Rossetti, MD  traMADol (ULTRAM) 50 MG tablet Take 1 tablet (50 mg total) by mouth every 6 (six) hours as needed for moderate pain. 04/08/18   Kathie Dike, MD    Family History Family History  Problem Relation Age of Onset  . Diabetes Mother   . Heart disease Mother   . Diabetes Sister   . Hypertension Sister   . Hyperlipidemia Sister   . Diabetes Brother   . Heart disease Brother   . Diabetes Brother   . Heart disease Brother     Social History Social History   Tobacco Use  . Smoking status: Former Smoker    Packs/day: 1.50    Years: 30.00     Pack years: 45.00    Types: Cigarettes    Last attempt to quit: 05/26/2013    Years since quitting: 5.4  . Smokeless tobacco: Never Used  . Tobacco comment: smells of heavy smoke 02/18/17  Substance Use Topics  . Alcohol use: No  . Drug use: No     Allergies   Adhesive [tape]; Latex; and Zocor [simvastatin - high dose]   Review of Systems Review of Systems  Constitutional: Negative for appetite change and fatigue.  HENT: Negative for congestion, ear discharge and sinus pressure.   Eyes: Negative for discharge.  Respiratory: Positive for cough.   Cardiovascular: Positive for chest pain.  Gastrointestinal: Negative for abdominal pain and diarrhea.  Genitourinary: Negative for frequency and hematuria.  Musculoskeletal: Negative for back pain.  Skin: Negative for rash.  Neurological: Negative for seizures and headaches.  Psychiatric/Behavioral: Negative for hallucinations.     Physical Exam Updated Vital Signs BP 115/60   Pulse (!) 108   Temp 97.9 F (36.6 C)   Resp (!) 24   Ht 5\' 7"  (1.702 m)   Wt 54.4 kg   SpO2 (!) 85% Comment: placed patient on 2L O2 post treatment  BMI 18.79 kg/m   Physical Exam Vitals signs reviewed.  Constitutional:      Appearance: She is well-developed.  HENT:     Head: Normocephalic.     Nose: Nose normal.  Eyes:     General: No scleral icterus.    Conjunctiva/sclera: Conjunctivae normal.  Neck:     Musculoskeletal: Neck supple.     Thyroid: No thyromegaly.  Cardiovascular:     Rate and Rhythm: Normal rate and regular rhythm.     Heart sounds: No murmur. No friction rub. No gallop.   Pulmonary:     Breath sounds: No stridor. Wheezing present. No rales.  Chest:     Chest wall: No tenderness.  Abdominal:     General: There is no distension.     Tenderness: There is no abdominal tenderness. There is no rebound.  Musculoskeletal: Normal range of motion.  Lymphadenopathy:     Cervical: No  cervical adenopathy.  Skin:    Findings: No  erythema or rash.  Neurological:     Mental Status: She is oriented to person, place, and time.     Motor: No abnormal muscle tone.     Coordination: Coordination normal.  Psychiatric:        Behavior: Behavior normal.      ED Treatments / Results  Labs (all labs ordered are listed, but only abnormal results are displayed) Labs Reviewed  CBC WITH DIFFERENTIAL/PLATELET - Abnormal; Notable for the following components:      Result Value   WBC 14.7 (*)    RBC 3.13 (*)    Hemoglobin 9.0 (*)    HCT 29.0 (*)    RDW 15.9 (*)    Neutro Abs 11.8 (*)    Monocytes Absolute 1.4 (*)    Abs Immature Granulocytes 0.12 (*)    All other components within normal limits  COMPREHENSIVE METABOLIC PANEL - Abnormal; Notable for the following components:   Chloride 97 (*)    Calcium 8.8 (*)    Albumin 3.4 (*)    All other components within normal limits    EKG None  Radiology Dg Chest 2 View  Result Date: 11/03/2018 CLINICAL DATA:  Recent fall with right-sided chest pain, initial encounter EXAM: CHEST - 2 VIEW COMPARISON:  04/06/2018 FINDINGS: Cardiac shadow is stable. Postsurgical changes are again noted. Patchy bibasilar infiltrates are seen. Hyperinflation is noted consistent with COPD. No acute bony abnormality is noted. IMPRESSION: Patchy bibasilar infiltrates are noted. Electronically Signed   By: Inez Catalina M.D.   On: 11/03/2018 14:03    Procedures Procedures (including critical care time)  Medications Ordered in ED Medications  levofloxacin (LEVAQUIN) IVPB 500 mg (500 mg Intravenous New Bag/Given 11/03/18 1510)  ipratropium-albuterol (DUONEB) 0.5-2.5 (3) MG/3ML nebulizer solution 3 mL (3 mLs Nebulization Given 11/03/18 1502)  albuterol (PROVENTIL) (2.5 MG/3ML) 0.083% nebulizer solution 2.5 mg (2.5 mg Nebulization Given 11/03/18 1503)     Initial Impression / Assessment and Plan / ED Course  I have reviewed the triage vital signs and the nursing notes.  Pertinent labs & imaging  results that were available during my care of the patient were reviewed by me and considered in my medical decision making (see chart for details).     With hypoxia and exacerbation of COPD with pneumonia..  Patient will be admitted to medicine  Final Clinical Impressions(s) / ED Diagnoses   Final diagnoses:  Community acquired pneumonia, unspecified laterality    ED Discharge Orders         Ordered    HYDROcodone-acetaminophen (NORCO/VICODIN) 5-325 MG tablet  Every 6 hours PRN,   Status:  Discontinued     11/03/18 1525    HYDROcodone-acetaminophen (NORCO/VICODIN) 5-325 MG tablet  Every 6 hours PRN,   Status:  Discontinued     11/03/18 [place]           Milton Ferguson, MD 11/03/18 1607

## 2018-11-03 NOTE — ED Triage Notes (Signed)
Pt states that she has fell and hurt her right ribs. Pt states that she just slide when she fell.

## 2018-11-03 NOTE — ED Triage Notes (Signed)
Pt also has a cough that she has had for 6 weeks.

## 2018-11-03 NOTE — H&P (Signed)
History and Physical    Mary [name] RSW:[id number] DOB: 12/20/1950 DOA: 11/03/2018  PCP: Celene Squibb, MD  Patient coming from: Home  I have personally briefly reviewed patient's old medical records in [place]  Chief Complaint: Wheezing, cough, dyspnea  HPI: Mary [name] is a 68 y.o. female with medical history significant for COPD, type 2 diabetes, hypertension, hyperlipidemia, chronic diastolic CHF, atrial fibrillation on Eliquis presents to the ED with 6 weeks of cough productive of greenish and yellowish sputum and progressive dyspnea with wheezing over the last few days.  She says her home breathing treatments have helped with her dyspnea however has continued chest congestion.  She also reports recurrent falls at home and right lateral rib pain after a recent fall which limits her ability to take adequate deep inspirations.  She reports sick contacts in her grandchildren who have had recent cold symptoms.  She denies any subjective fevers, abdominal pain, diarrhea or constipation, or dysuria.  ED Course:  Initial vitals showed BP 115/60, pulse 108, RR 24, temp 97.9 Fahrenheit, SPO2 92% on room air decreased to 85% on room air requiring supplemental oxygen via [place].  Labs are notable for WBC 14.7, hemoglobin 9.0 (8.4 on 04/08/2018), platelets 305.  Chemistry panel was largely unremarkable.  2 view chest x-ray showed prior sternotomy changes, hyperinflated lung fields, and bibasilar infiltrates.  The patient was given albuterol DuoNeb treatments and started on IV levofloxacin.  The hospitalist team was consulted to admit for further management.  Review of Systems: As per HPI otherwise 10 point review of systems negative.    Past Medical History:  Diagnosis Date  . Anxiety   . Arthritis   . Atrial fibrillation ([place])   . Bronchial asthma   . CHF (congestive heart failure) ([place])   . Chronic pain    Bacl pain, Disc L5-S1- Dr. Joya Salm in [place]  . COPD (chronic obstructive  pulmonary disease) ([place])   . DDD (degenerative disc disease)    Radicular symptoms  . Diabetes mellitus   . History of stress test 06/2011   Abnormal myocardial perfusion study.  Marland Kitchen Hx of echocardiogram    Was interpreted by Dr Doylene Canard that showed an Ef in the 50-60% range with grade 1 diastolic dysfunction. she had moderate MR, biatrial enlargement, moderate TR and at that time estimated RV systolic pressure was 43 mm.  . Hyperlipidemia   . Hypertension   . Neuropathy   . Shortness of breath   . Tachycardia     Past Surgical History:  Procedure Laterality Date  . ABDOMINAL HYSTERECTOMY     partial  . Breast cyst removed    . CARPAL TUNNEL RELEASE     x 2  . CORONARY ARTERY BYPASS GRAFT N/A 01/09/2014   Procedure: CORONARY ARTERY BYPASS GRAFTING (CABG) x 3 using endoscopically harvested right saphenous vein and left internal mammary artery and closure of left atrial appendage;  Surgeon: Ivin Poot, MD;  Location: [place];  Service: Open Heart Surgery;  Laterality: N/A;  patient has preop IA BP   . INTRAOPERATIVE TRANSESOPHAGEAL ECHOCARDIOGRAM N/A 01/09/2014   Procedure: INTRAOPERATIVE TRANSESOPHAGEAL ECHOCARDIOGRAM;  Surgeon: Ivin Poot, MD;  Location: [place];  Service: Open Heart Surgery;  Laterality: N/A;  . LEFT HEART CATHETERIZATION WITH CORONARY ANGIOGRAM N/A 01/07/2014   Procedure: LEFT HEART CATHETERIZATION WITH CORONARY ANGIOGRAM;  Surgeon: Lorretta Harp, MD;  Location: [facility] C[facility] LAB;  Service: Cardiovascular;  Laterality: N/A;  . TONSILLECTOMY    .  TUBAL LIGATION       reports that she quit smoking about 5 years ago. Her smoking use included cigarettes. She has a 45.00 pack-year smoking history. She has never used smokeless tobacco. She reports that she does not drink alcohol or use drugs.  Allergies  Allergen Reactions  . Adhesive [Tape] Other (See Comments)    Tears skin off.   . Latex Other (See Comments)    In tape, tears skin off.  . Zocor [Simvastatin - High  Dose] Nausea And Vomiting    Family History  Problem Relation Age of Onset  . Diabetes Mother   . Heart disease Mother   . Diabetes Sister   . Hypertension Sister   . Hyperlipidemia Sister   . Diabetes Brother   . Heart disease Brother   . Diabetes Brother   . Heart disease Brother      Prior to Admission medications   Medication Sig Start Date End Date Taking? Authorizing Provider  acetaminophen (TYLENOL) 500 MG tablet Take 500 mg by mouth every 6 (six) hours as needed for mild pain.   Yes [provider]  albuterol (PROVENTIL HFA;VENTOLIN HFA) 108 (90 Base) MCG/ACT inhaler Inhale 2 puffs into the lungs every 6 (six) hours as needed for shortness of breath.    Yes [provider]  aspirin EC 81 MG tablet Take 81 mg by mouth daily.   Yes [provider]  atorvastatin (LIPITOR) 40 MG tablet TAKE ONE TABLET BY MOUTH DAILY. 07/25/18  Yes Troy Sine, MD  diltiazem (CARDIZEM) 120 MG tablet Take 120 mg by mouth daily.   Yes [provider]  furosemide (LASIX) 40 MG tablet TAKE ONE TABLET BY MOUTH ONCE DAILY. 06/23/18  Yes Troy Sine, MD  gabapentin (NEURONTIN) 100 MG capsule Take 100 mg by mouth every 8 (eight) hours as needed (back pain).   Yes [provider]  glipiZIDE (GLUCOTROL XL) 2.5 MG 24 hr tablet 2.5 mg daily.  02/28/16  Yes [provider]  Iron-FA-B Cmp-C-Biot-Probiotic (FUSION PLUS PO) Take 1 tablet by mouth daily.   Yes [provider]  JANUVIA 100 MG tablet TAKE ONE TABLET BY MOUTH DAILY. 07/19/15  Yes [place], Modena Nunnery, MD  metoprolol tartrate (LOPRESSOR) 25 MG tablet Take 0.5 tablets (12.5 mg total) by mouth 2 (two) times daily. KEEP OV. 05/30/18  Yes Troy Sine, MD  potassium chloride SA (K-DUR,KLOR-CON) 20 MEQ tablet Take 1 tablet (20 mEq total) by mouth daily. 11/06/16  Yes Thurnell Lose, MD  SYMBICORT 160-4.5 MCG/ACT inhaler INHALE 2 PUFFS INTO THE LUNGS TWICE DAILY. RINSE MOUTH AFTER USE. 06/04/15   Yes Alycia Rossetti, MD    Physical Exam: Vitals:   11/03/18 1326 11/03/18 1327 11/03/18 1507  BP: 115/60    Pulse: (!) 108    Resp: (!) 24    Temp: 97.9 F (36.6 C)    SpO2: 92%  (!) 85%  Weight:  54.4 kg   Height:  5\' 7"  (1.702 m)     Constitutional: Frail elderly woman resting supine in bed, NAD, calm, comfortable Eyes: PERRL, lids and conjunctivae normal ENMT: Mucous membranes are moist. Posterior pharynx clear of any exudate or lesions. Normal dentition.  Neck: normal, supple, no masses. Respiratory: Bibasilar inspiratory crackles, faint inspiratory wheezing. Normal respiratory effort. No accessory muscle use.  Cardiovascular: Regular rate and rhythm, no murmurs / rubs / gallops. No extremity edema.  Abdomen: no tenderness, no masses palpated. No hepatosplenomegaly. Bowel sounds positive.  Musculoskeletal: Slight tenderness palpation right lateral chest wall along the ribs.  No clubbing / cyanosis. No joint deformity upper and lower extremities. No contractures. Normal muscle tone.  Skin: no rashes, lesions, ulcers. No induration Neurologic: CN 2-12 grossly intact. Sensation intact. Strength 5/5 in all 4.  Psychiatric: Normal judgment and insight. Alert and oriented x 3. Normal mood.    Labs on Admission: I have personally reviewed following labs and imaging studies  CBC: Recent Labs  Lab 11/03/18 1504  WBC 14.7*  NEUTROABS 11.8*  HGB 9.0*  HCT 29.0*  MCV 92.7  PLT 119   Basic Metabolic Panel: Recent Labs  Lab 11/03/18 1504  NA 135  K 3.8  CL 97*  CO2 27  GLUCOSE 91  BUN 22  CREATININE 0.90  CALCIUM 8.8*   GFR: Estimated Creatinine Clearance: 50.7 mL/min (by C-G formula based on SCr of 0.9 mg/dL). Liver Function Tests: Recent Labs  Lab 11/03/18 1504  AST 20  ALT 15  ALKPHOS 57  BILITOT 0.5  PROT 7.5  ALBUMIN 3.4*   No results for input(s): LIPASE, AMYLASE in the last 168 hours. No results for input(s): AMMONIA in the last 168  hours. Coagulation Profile: No results for input(s): INR, PROTIME in the last 168 hours. Cardiac Enzymes: No results for input(s): CKTOTAL, CKMB, CKMBINDEX, TROPONINI in the last 168 hours. BNP (last 3 results) No results for input(s): PROBNP in the last 8760 hours. HbA1C: No results for input(s): HGBA1C in the last 72 hours. CBG: No results for input(s): GLUCAP in the last 168 hours. Lipid Profile: No results for input(s): CHOL, HDL, LDLCALC, TRIG, CHOLHDL, LDLDIRECT in the last 72 hours. Thyroid Function Tests: No results for input(s): TSH, T4TOTAL, FREET4, T3FREE, THYROIDAB in the last 72 hours. Anemia Panel: No results for input(s): VITAMINB12, FOLATE, FERRITIN, TIBC, IRON, RETICCTPCT in the last 72 hours. Urine analysis:    Component Value Date/Time   COLORURINE YELLOW 04/06/2018 1311   APPEARANCEUR CLEAR 04/06/2018 1311   LABSPEC 1.008 04/06/2018 1311   PHURINE 6.0 04/06/2018 1311   GLUCOSEU NEGATIVE 04/06/2018 1311   HGBUR NEGATIVE 04/06/2018 1311   BILIRUBINUR NEGATIVE 04/06/2018 1311   KETONESUR NEGATIVE 04/06/2018 1311   PROTEINUR NEGATIVE 04/06/2018 1311   UROBILINOGEN 0.2 02/17/2015 2040   NITRITE NEGATIVE 04/06/2018 1311   LEUKOCYTESUR NEGATIVE 04/06/2018 1311    Radiological Exams on Admission: Dg Chest 2 View  Result Date: 11/03/2018 CLINICAL DATA:  Recent fall with right-sided chest pain, initial encounter EXAM: CHEST - 2 VIEW COMPARISON:  04/06/2018 FINDINGS: Cardiac shadow is stable. Postsurgical changes are again noted. Patchy bibasilar infiltrates are seen. Hyperinflation is noted consistent with COPD. No acute bony abnormality is noted. IMPRESSION: Patchy bibasilar infiltrates are noted. Electronically Signed   By: Inez Catalina M.D.   On: 11/03/2018 14:03     Assessment/Plan Principal Problem:   Acute on chronic respiratory failure with hypoxia (HCC) Active Problems:   Diabetes mellitus (HCC)   Hyperlipidemia   COPD (chronic obstructive pulmonary  disease) (HCC)   Diastolic congestive heart failure (HCC)   Atrial fibrillation (HCC)   CAD (coronary artery disease)   HTN (hypertension)   Rib pain on right side   Mary [name] is a 68 y.o. female with medical history significant for COPD, type 2 diabetes, hypertension, hyperlipidemia, chronic diastolic CHF, atrial fibrillation on Eliquis presents to the ED with 6 weeks of cough productive of greenish and yellowish sputum and progressive dyspnea with wheezing over the last few days admitted  with acute on chronic respiratory failure with hypoxia secondary to community-acquired pneumonia.  Acute on chronic respiratory failure with hypoxia: Chest x-ray with bibasilar infiltrates suggestive of pneumonia.  She is requiring supplemental oxygenation admission (not on O2 at home). -IV ceftriaxone/azithromycin -Supplemental O2 as needed, wean down as able -IS, Mucinex  COPD: Mild wheezing on admission. -Schedule duo nebs, PRN albuterol nebs  Rib pain on the right side: Occurred after fall at home. -Check right rib x-ray  Atrial fibrillation on Eliquis: Rates increased on admission, likely exacerbated by breathing treatments. -Start on diltiazem and metoprolol -Continue Eliquis  Type 2 diabetes: On glipizide and Januvia at home. -Sensitive SSI while inpatient  CAD status post CABG: No active chest pain. -Continue aspirin 81 mg daily -Continue atorvastatin  Chronic diastolic CHF: Appears euvolemic to mildly hypovolemic. -Continue metoprolol -Hold home Lasix -Monitor I/O's  Hypertension: Normotensive on admission -Continue diltiazem and metoprolol  Hyperlipidemia: -Continue atorvastatin  DVT prophylaxis: Eliquis Code Status: DNR/DNI, confirmed with patient Family Communication: None present at bedside admission Disposition Plan: Pending clinical progress. Consults called: None Admission status: Observation   Zada Finders MD [facility] Pager [phone]  If  7PM-7AM, please contact night-coverage [email or web address] Password [email or web address]  11/03/2018, 5:09 PM

## 2018-11-04 DIAGNOSIS — J449 Chronic obstructive pulmonary disease, unspecified: Secondary | ICD-10-CM

## 2018-11-04 DIAGNOSIS — J189 Pneumonia, unspecified organism: Secondary | ICD-10-CM

## 2018-11-04 DIAGNOSIS — I2510 Atherosclerotic heart disease of native coronary artery without angina pectoris: Secondary | ICD-10-CM

## 2018-11-04 DIAGNOSIS — I4821 Permanent atrial fibrillation: Secondary | ICD-10-CM

## 2018-11-04 DIAGNOSIS — E1141 Type 2 diabetes mellitus with diabetic mononeuropathy: Secondary | ICD-10-CM

## 2018-11-04 DIAGNOSIS — I10 Essential (primary) hypertension: Secondary | ICD-10-CM

## 2018-11-04 LAB — GLUCOSE, CAPILLARY
GLUCOSE-CAPILLARY: 100 mg/dL — AB (ref 70–99)
Glucose-Capillary: 222 mg/dL — ABNORMAL HIGH (ref 70–99)
Glucose-Capillary: 191 mg/dL — ABNORMAL HIGH (ref 70–99)
Glucose-Capillary: 391 mg/dL — ABNORMAL HIGH (ref 70–99)

## 2018-11-04 LAB — BASIC METABOLIC PANEL
Anion gap: 8 (ref 5–15)
BUN: 17 mg/dL (ref 8–23)
CALCIUM: 8.5 mg/dL — AB (ref 8.9–10.3)
CO2: 28 mmol/L (ref 22–32)
Chloride: 99 mmol/L (ref 98–111)
Creatinine, Ser: 0.83 mg/dL (ref 0.44–1.00)
GFR calc non Af Amer: >60 mL/min (ref >60)
Glucose, Bld: 103 mg/dL — ABNORMAL HIGH (ref 70–99)
Potassium: 3.9 mmol/L (ref 3.5–5.1)
Sodium: 135 mmol/L (ref 135–145)

## 2018-11-04 LAB — CBC
HCT: 25.7 % — ABNORMAL LOW (ref 36.0–46.0)
Hemoglobin: 8 g/dL — ABNORMAL LOW (ref 12.0–15.0)
MCH: 28.3 pg (ref 26.0–34.0)
MCHC: 31.1 g/dL (ref 30.0–36.0)
MCV: 90.8 fL (ref 80.0–100.0)
Platelets: 258 10*3/uL (ref 150–400)
RBC: 2.83 MIL/uL — ABNORMAL LOW (ref 3.87–5.11)
RDW: 16 % — AB (ref 11.5–15.5)
WBC: 11.4 10*3/uL — ABNORMAL HIGH (ref 4.0–10.5)
nRBC: 0 % (ref 0.0–0.2)

## 2018-11-04 MED ORDER — DILTIAZEM HCL ER COATED BEADS 120 MG PO CP24
120.0000 mg | ORAL_CAPSULE | Freq: Every day | ORAL | Status: DC
  Administered 2018-11-04 – 2018-11-06 (×3): 120 mg via ORAL
  Filled 2018-11-04 (×3): qty 1

## 2018-11-04 MED ORDER — IPRATROPIUM BROMIDE 0.02 % IN SOLN
0.5000 mg | Freq: Four times a day (QID) | RESPIRATORY_TRACT | Status: DC
  Administered 2018-11-04 – 2018-11-06 (×9): 0.5 mg via RESPIRATORY_TRACT
  Filled 2018-11-04 (×10): qty 2.5

## 2018-11-04 MED ORDER — INSULIN ASPART 100 UNIT/ML ~~LOC~~ SOLN
5.0000 [IU] | Freq: Once | SUBCUTANEOUS | Status: AC
  Administered 2018-11-04: 5 [IU] via SUBCUTANEOUS

## 2018-11-04 MED ORDER — LEVALBUTEROL HCL 1.25 MG/0.5ML IN NEBU
1.2500 mg | INHALATION_SOLUTION | Freq: Four times a day (QID) | RESPIRATORY_TRACT | Status: DC
  Administered 2018-11-04 – 2018-11-06 (×9): 1.25 mg via RESPIRATORY_TRACT
  Filled 2018-11-04 (×10): qty 0.5

## 2018-11-04 MED ORDER — BUDESONIDE 0.25 MG/2ML IN SUSP
0.2500 mg | Freq: Two times a day (BID) | RESPIRATORY_TRACT | Status: DC
  Administered 2018-11-04 – 2018-11-06 (×5): 0.25 mg via RESPIRATORY_TRACT
  Filled 2018-11-04 (×5): qty 2

## 2018-11-04 MED ORDER — LEVALBUTEROL HCL 1.25 MG/0.5ML IN NEBU: 1.2500 mg | INHALATION_SOLUTION | Freq: Four times a day (QID) | RESPIRATORY_TRACT | Status: DC | PRN

## 2018-11-04 MED ORDER — APIXABAN 5 MG PO TABS
5.0000 mg | ORAL_TABLET | Freq: Two times a day (BID) | ORAL | Status: DC
  Administered 2018-11-04 – 2018-11-06 (×5): 5 mg via ORAL
  Filled 2018-11-04 (×5): qty 1

## 2018-11-04 MED ORDER — PREDNISONE 20 MG PO TABS
40.0000 mg | ORAL_TABLET | Freq: Every day | ORAL | Status: DC
  Administered 2018-11-04 – 2018-11-06 (×3): 40 mg via ORAL
  Filled 2018-11-04 (×3): qty 2

## 2018-11-04 MED ORDER — GUAIFENESIN-DM 100-10 MG/5ML PO SYRP
5.0000 mL | ORAL_SOLUTION | ORAL | Status: DC | PRN
  Administered 2018-11-04: 5 mL via ORAL
  Filled 2018-11-04: qty 5

## 2018-11-04 NOTE — Care Management Obs Status (Signed)
[facility] NOTIFICATION   Patient Details  Name: Mary [name] MRN: [id number] Date of Birth: 11-13-1950   Medicare Observation Status Notification Given:  Yes    Shelda Altes 11/04/2018, 10:26 AM

## 2018-11-04 NOTE — Progress Notes (Signed)
PROGRESS NOTE  Mary [name] BTD:[id number] DOB: [birth year] DOA: 11/03/2018 PCP: Celene Squibb, MD   LOS: 0 days   Brief Narrative / Interim history: Mary [name] is a 68 y.o. female with medical history significant for COPD, type 2 diabetes, hypertension, hyperlipidemia, chronic diastolic CHF, atrial fibrillation on Eliquis presents to the ED with 6 weeks of cough productive of greenish and yellowish sputum and progressive dyspnea with wheezing over the last few days.  She says her home breathing treatments have helped with her dyspnea however has continued chest congestion.  She also reports recurrent falls at home and right lateral rib pain after a recent fall which limits her ability to take adequate deep inspirations.  She reports sick contacts in her grandchildren who have had recent cold symptoms.  She denies any subjective fevers, abdominal pain, diarrhea or constipation, or dysuria.  Subjective: -Complains of shortness of breath and a nagging cough which is productive in quality, and excruciating right-sided chest pain every time she coughs.  She is having difficulty taking a deep breath.  She denies any fever or chills, she denies any abdominal pain, nausea or vomiting  Assessment & Plan: Principal Problem:   Acute on chronic respiratory failure with hypoxia (HCC) Active Problems:   Diabetes mellitus (HCC)   Hyperlipidemia   COPD (chronic obstructive pulmonary disease) (HCC)   Diastolic congestive heart failure (HCC)   Atrial fibrillation (HCC)   CAD (coronary artery disease)   HTN (hypertension)   Rib pain on right side   Principal Problem Acute on chronic hypoxic respiratory failure due to bilateral lower lobe pneumonia as well as COPD exacerbation -Patient was started on antibiotics with ceftriaxone and azithromycin, continue.  Cultures not obtained on admission -Continue respiratory support with supplemental oxygen, Pulmicort, change albuterol to levalbuterol due to history of  A. fib, start prednisone for wheezing -Patient tells me that she has been having oxygen at home in the past when she was first diagnosed with COPD but currently is not requiring--Per patient  Active Problems Permanent A. fib -Continue metoprolol for rate control, continue Eliquis for anticoagulation -Given how much time she fell in the past, she may be at high risk for bleeding with Eliquis.  Recurrent falls/deconditioning with rib fractures -Patient has been falling more so in the last several weeks, has had 3 falls, most recent one a week ago where she fell on her right side. 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  Allow a diet.  Hypertension -Keep on diltiazem and metoprolol for A. fib, closely monitor blood pressure   Scheduled Meds: . apixaban  5 mg Oral BID  . aspirin EC  81 mg Oral Daily  . atorvastatin  40 mg Oral Daily  . budesonide (PULMICORT) nebulizer solution  0.25 mg Nebulization BID  . diltiazem  120 mg Oral Daily  . feeding supplement (ENSURE ENLIVE)  237 mL Oral BID BM  . insulin aspart  0-9 Units Subcutaneous TID WC  . ipratropium  0.5 mg Nebulization Q6H  . levalbuterol  1.25 mg Nebulization  Q6H  . mouth rinse  15 mL Mouth Rinse BID  . metoprolol tartrate  12.5 mg Oral BID  . multivitamin with minerals  1 tablet Oral Daily  . predniSONE  40 mg Oral Q breakfast   Continuous Infusions: . azithromycin    . cefTRIAXone (ROCEPHIN)  IV 1 g (11/04/18 0900)   PRN Meds:.acetaminophen **OR**  acetaminophen, albuterol, gabapentin, guaiFENesin-dextromethorphan, levalbuterol, oxyCODONE   DVT prophylaxis: Eliquis Code Status: DNR Family Communication: No family present at bedside Disposition Plan: To be determined, PT to evaluate, suspect she will need SNF  Consultants:   None  Procedures:   None   Antimicrobials:  Ceftriaxone 1/10 >>  Azithromycin 1/10 >>  Objective: Vitals:   11/04/18 0516 11/04/18 0611 11/04/18 0749 11/04/18 0913  BP: (!) 98/55   (!) 117/52  Pulse: (!) 107     Resp: 20     Temp: 99.1 F (37.3 C)     TempSrc: Oral     SpO2: 93%  93%   Weight:  53.1 kg    Height:        Intake/Output Summary (Last 24 hours) at 11/04/2018 1102 Last data filed at 11/04/2018 6295 Gross per 24 hour  Intake 240 ml  Output -  Net 240 ml   Filed Weights   11/03/18 1327 11/04/18 0611  Weight: 54.4 kg 53.1 kg    Examination:  Constitutional: Increased work of breathing when I entered the room, coughing incessantly with almost every sentence Eyes: No scleral icterus ENMT: Mucous membranes are moist. No oropharyngeal exudates Neck: normal, supple Respiratory: Overall distant breath sounds, scant end expiratory wheezing, no crackles heard.  Moves air well.  Increased respiratory effort and tachypneic. Cardiovascular: Irregularly irregular, no murmurs appreciated. No LE edema. 2+ pedal pulses.  Abdomen: no tenderness. Bowel sounds positive.  Musculoskeletal: no clubbing / cyanosis.  Skin: no rashes Neurologic: CN 2-12 grossly intact. Strength 5/5 in all 4.  Psychiatric: Normal judgment and insight. Alert and oriented x 3. Normal mood.    Data Reviewed: I have independently reviewed following labs and imaging studies   CBC: Recent Labs  Lab 11/03/18 1504 11/04/18 0552  WBC 14.7* 11.4*  NEUTROABS 11.8*  --   HGB 9.0* 8.0*  HCT 29.0* 25.7*  MCV 92.7 90.8  PLT 305 284   Basic Metabolic Panel: Recent Labs  Lab 11/03/18 1504 11/04/18 0552  NA 135  135  K 3.8 3.9  CL 97* 99  CO2 27 28  GLUCOSE 91 103*  BUN 22 17  CREATININE 0.90 0.83  CALCIUM 8.8* 8.5*   GFR: Estimated Creatinine Clearance: 53.6 mL/min (by C-G formula based on SCr of 0.83 mg/dL). Liver Function Tests: Recent Labs  Lab 11/03/18 1504  AST 20  ALT 15  ALKPHOS 57  BILITOT 0.5  PROT 7.5  ALBUMIN 3.4*   No results for input(s): LIPASE, AMYLASE in the last 168 hours. No results for input(s): AMMONIA in the last 168 hours. Coagulation Profile: No results for input(s): INR, PROTIME in the last 168 hours. Cardiac Enzymes: No results for input(s): CKTOTAL, CKMB, CKMBINDEX, TROPONINI in the last 168 hours. BNP (last 3 results) No results for input(s): PROBNP in the last 8760 hours. HbA1C: No results for input(s): HGBA1C in the last 72 hours. CBG: Recent Labs  Lab 11/03/18 1748 11/03/18 2217 11/04/18 0728  GLUCAP 79 125* 100*   Lipid Profile: No results for input(s): CHOL, HDL, LDLCALC, TRIG, CHOLHDL, LDLDIRECT in the last 72 hours. Thyroid Function Tests: No  results for input(s): TSH, T4TOTAL, FREET4, T3FREE, THYROIDAB in the last 72 hours. Anemia Panel: No results for input(s): VITAMINB12, FOLATE, FERRITIN, TIBC, IRON, RETICCTPCT in the last 72 hours. Urine analysis:    Component Value Date/Time   COLORURINE YELLOW 04/06/2018 1311   APPEARANCEUR CLEAR 04/06/2018 1311   LABSPEC 1.008 04/06/2018 1311   PHURINE 6.0 04/06/2018 1311   GLUCOSEU NEGATIVE 04/06/2018 1311   HGBUR NEGATIVE 04/06/2018 1311   BILIRUBINUR NEGATIVE 04/06/2018 1311   KETONESUR NEGATIVE 04/06/2018 1311   PROTEINUR NEGATIVE 04/06/2018 1311   UROBILINOGEN 0.2 02/17/2015 2040   NITRITE NEGATIVE 04/06/2018 1311   LEUKOCYTESUR NEGATIVE 04/06/2018 1311   Sepsis Labs: Invalid input(s): PROCALCITONIN, LACTICIDVEN  No results found for this or any previous visit (from the past 240 hour(s)).    Radiology Studies: Dg Chest 2 View  Result Date: 11/03/2018 CLINICAL DATA:  Recent  fall with right-sided chest pain, initial encounter EXAM: CHEST - 2 VIEW COMPARISON:  04/06/2018 FINDINGS: Cardiac shadow is stable. Postsurgical changes are again noted. Patchy bibasilar infiltrates are seen. Hyperinflation is noted consistent with COPD. No acute bony abnormality is noted. IMPRESSION: Patchy bibasilar infiltrates are noted. Electronically Signed   By: Inez Catalina M.D.   On: 11/03/2018 14:03   Dg Ribs Unilateral Right  Result Date: 11/03/2018 CLINICAL DATA:  Recent fall with right-sided chest pain, initial encounter EXAM: RIGHT RIBS - 2 VIEW COMPARISON:  None. FINDINGS: Mildly displaced fracture of the right eighth through tenth ribs are noted laterally. No underlying pneumothorax is seen. No other focal abnormality is noted. IMPRESSION: Mildly displaced fractures of the right eighth through tenth ribs are seen without complicating factors. Electronically Signed   By: Inez Catalina M.D.   On: 11/03/2018 17:45    Marzetta Board, MD, PhD [facility]  Contact via  [email or web address]  [place] P: [phone]  F: [phone]

## 2018-11-04 NOTE — Progress Notes (Signed)
Saturation 86 on 2lpm -- placed on 5 lpm /[place]  Patient does not show ans shortness of breath other than increased hr 112 f 20 24

## 2018-11-04 NOTE — Evaluation (Signed)
Physical Therapy Evaluation Patient Details Name: Mary [name] MRN: [id number] DOB: July 24, 1951 Today's Date: 11/04/2018   History of Present Illness  Mary [name] is a 68 y.o. female with medical history significant for COPD, type 2 diabetes, hypertension, hyperlipidemia, chronic diastolic CHF, atrial fibrillation on Eliquis presents to the ED with 6 weeks of cough productive of greenish and yellowish sputum and progressive dyspnea with wheezing over the last few days.  She says her home breathing treatments have helped with her dyspnea however has continued chest congestion.  She also reports recurrent falls at home and right lateral rib pain after a recent fall which limits her ability to take adequate deep inspirations.  She reports sick contacts in her grandchildren who have had recent cold symptoms.  She denies any subjective fevers, abdominal pain, diarrhea or constipation, or dysuria.    Clinical Impression  Patient functioning below baseline and limited for functional mobility as stated below secondary to BLE weakness, fatigue and fair/poor standing balance.  Patient left in bathroom sitting on commode with 2 LPM O2 and nursing staff notified. Patient desaturated to 85% on room air while seated at edge of bed, desaturated to 88% on 2 LPM while ambulating, patient at 92% after sitting in chair. Patient will benefit from continued physical therapy in hospital and recommended venue below to increase strength, balance, endurance for safe ADLs and gait.    Follow Up Recommendations SNF;Supervision for mobility/OOB;Supervision - Intermittent    Equipment Recommendations  None recommended by PT    Recommendations for Other Services       Precautions / Restrictions Precautions Precautions: Fall Restrictions Weight Bearing Restrictions: No      Mobility  Bed Mobility Overal bed mobility: Modified Independent             General bed mobility comments: increased  time  Transfers Overall transfer level: Needs assistance Equipment used: Rolling walker (2 wheeled) Transfers: Sit to/from [facility] Sit to Stand: Min guard Stand pivot transfers: Min guard       General transfer comment: labored movement  Ambulation/Gait Ambulation/Gait assistance: Min guard Gait Distance (Feet): 80 Feet Assistive device: Rolling walker (2 wheeled) Gait Pattern/deviations: Decreased step length - right;Decreased step length - left;Decreased stride length Gait velocity: decreased   General Gait Details: slow cadence, decreased trunk rotation due to R rib fracture pain  Stairs            Wheelchair Mobility    Modified Rankin (Stroke Patients Only)       Balance Overall balance assessment: Needs assistance Sitting-balance support: Feet supported;No upper extremity supported Sitting balance-Leahy Scale: Good     Standing balance support: During functional activity;No upper extremity supported Standing balance-Leahy Scale: Poor Standing balance comment: fair/poor without upper extremity support, fair with RW                             Pertinent Vitals/Pain Pain Assessment: Faces Pain Score: 10-Worst pain ever Faces Pain Scale: Hurts a little bit Pain Location: R rib fracture site Pain Descriptors / Indicators: Constant;Sore Pain Intervention(s): Limited activity within patient's tolerance;Monitored during session    [place] expects to be discharged to:: Private residence Living Arrangements: Alone Available Help at Discharge: Friend(s);Neighbor(neighbor who is currently in SNF due to fall at home) Type of Home: Apartment Home Access: Level entry     Home Layout: One level Home Equipment: Walker - 2 wheels;Cane - single point;Bedside  commode;Transport chair;Shower seat      Prior Function Level of Independence: Independent with assistive device(s)         Comments: community  ambulator with SPC or RW, limited driving     Hand Dominance        Extremity/Trunk Assessment   Upper Extremity Assessment Upper Extremity Assessment: Generalized weakness    Lower Extremity Assessment Lower Extremity Assessment: Generalized weakness    Cervical / Trunk Assessment Cervical / Trunk Assessment: Normal  Communication   Communication: No difficulties  Cognition Arousal/Alertness: Awake/alert Behavior During Therapy: WFL for tasks assessed/performed Overall Cognitive Status: Within Functional Limits for tasks assessed                                        General Comments      Exercises     Assessment/Plan    PT Assessment Patient needs continued PT services  PT Problem List Decreased strength;Decreased activity tolerance;Decreased balance;Decreased mobility;Pain       PT Treatment Interventions Gait training;Stair training;Functional mobility training;Therapeutic activities;Patient/family education;Therapeutic exercise    PT Goals (Current goals can be found in the Care Plan section)  Acute Rehab PT Goals Patient Stated Goal: return home PT Goal Formulation: With patient Time For Goal Achievement: 11/18/18 Potential to Achieve Goals: Good    Frequency Min 3X/week   Barriers to discharge        Co-evaluation               AM-PAC PT "6 Clicks" Mobility  Outcome Measure Help needed turning from your back to your side while in a flat bed without using bedrails?: None Help needed moving from lying on your back to sitting on the side of a flat bed without using bedrails?: None Help needed moving to and from a bed to a chair (including a wheelchair)?: A Little Help needed standing up from a chair using your arms (e.g., wheelchair or bedside chair)?: A Little Help needed to walk in hospital room?: A Little Help needed climbing 3-5 steps with a railing? : A Lot 6 Click Score: 19    End of Session Equipment Utilized During  Treatment: Oxygen Activity Tolerance: Patient tolerated treatment well;Patient limited by fatigue Patient left: Other (comment);with call bell/phone within reach Nurse Communication: Mobility status PT Visit Diagnosis: Unsteadiness on feet (R26.81);Other abnormalities of gait and mobility (R26.89);Muscle weakness (generalized) (M62.81)    Time: 0626-9485 PT Time Calculation (min) (ACUTE ONLY): 31 min   Charges:   PT Evaluation $PT Eval Moderate Complexity: 1 Mod PT Treatments $Therapeutic Activity: 23-37 mins        4:35 PM, 11/04/18 Talbot Grumbling, PT, DPT Physical Therapist with [facility] [phone] mobile phone

## 2018-11-04 NOTE — Plan of Care (Signed)
  Problem: Acute Rehab PT Goals(only PT should resolve) Goal: Patient Will Transfer Sit To/From Stand Outcome: Progressing Flowsheets (Taken 11/04/2018 1638) Patient will transfer sit to/from stand: with modified independence Goal: Pt Will Transfer Bed To Chair/Chair To Bed Outcome: Progressing Flowsheets (Taken 11/04/2018 1638) Pt will Transfer Bed to Chair/Chair to Bed: with modified independence Goal: Pt Will Ambulate Outcome: Progressing Flowsheets (Taken 11/04/2018 1638) Pt will Ambulate: > 125 feet; with modified independence; with rolling walker; with cane   4:39 PM, 11/04/18 Talbot Grumbling, PT, DPT Physical Therapist with [facility] [phone] mobile phone

## 2018-11-05 DIAGNOSIS — J449 Chronic obstructive pulmonary disease, unspecified: Secondary | ICD-10-CM | POA: Diagnosis not present

## 2018-11-05 DIAGNOSIS — Z833 Family history of diabetes mellitus: Secondary | ICD-10-CM | POA: Diagnosis not present

## 2018-11-05 DIAGNOSIS — F17210 Nicotine dependence, cigarettes, uncomplicated: Secondary | ICD-10-CM | POA: Diagnosis present

## 2018-11-05 DIAGNOSIS — E785 Hyperlipidemia, unspecified: Secondary | ICD-10-CM | POA: Diagnosis present

## 2018-11-05 DIAGNOSIS — Z7951 Long term (current) use of inhaled steroids: Secondary | ICD-10-CM | POA: Diagnosis not present

## 2018-11-05 DIAGNOSIS — J440 Chronic obstructive pulmonary disease with acute lower respiratory infection: Secondary | ICD-10-CM | POA: Diagnosis present

## 2018-11-05 DIAGNOSIS — W19XXXA Unspecified fall, initial encounter: Secondary | ICD-10-CM | POA: Diagnosis present

## 2018-11-05 DIAGNOSIS — Z7984 Long term (current) use of oral hypoglycemic drugs: Secondary | ICD-10-CM | POA: Diagnosis not present

## 2018-11-05 DIAGNOSIS — E119 Type 2 diabetes mellitus without complications: Secondary | ICD-10-CM | POA: Diagnosis present

## 2018-11-05 DIAGNOSIS — Y92009 Unspecified place in unspecified non-institutional (private) residence as the place of occurrence of the external cause: Secondary | ICD-10-CM | POA: Diagnosis not present

## 2018-11-05 DIAGNOSIS — Z9981 Dependence on supplemental oxygen: Secondary | ICD-10-CM | POA: Diagnosis not present

## 2018-11-05 DIAGNOSIS — S2241XA Multiple fractures of ribs, right side, initial encounter for closed fracture: Secondary | ICD-10-CM | POA: Diagnosis present

## 2018-11-05 DIAGNOSIS — I110 Hypertensive heart disease with heart failure: Secondary | ICD-10-CM | POA: Diagnosis present

## 2018-11-05 DIAGNOSIS — I4821 Permanent atrial fibrillation: Secondary | ICD-10-CM | POA: Diagnosis not present

## 2018-11-05 DIAGNOSIS — Z7982 Long term (current) use of aspirin: Secondary | ICD-10-CM | POA: Diagnosis not present

## 2018-11-05 DIAGNOSIS — R0781 Pleurodynia: Secondary | ICD-10-CM | POA: Diagnosis present

## 2018-11-05 DIAGNOSIS — J189 Pneumonia, unspecified organism: Secondary | ICD-10-CM | POA: Diagnosis not present

## 2018-11-05 DIAGNOSIS — I5032 Chronic diastolic (congestive) heart failure: Secondary | ICD-10-CM | POA: Diagnosis present

## 2018-11-05 DIAGNOSIS — Z66 Do not resuscitate: Secondary | ICD-10-CM | POA: Diagnosis present

## 2018-11-05 DIAGNOSIS — I2510 Atherosclerotic heart disease of native coronary artery without angina pectoris: Secondary | ICD-10-CM | POA: Diagnosis not present

## 2018-11-05 DIAGNOSIS — R296 Repeated falls: Secondary | ICD-10-CM | POA: Diagnosis present

## 2018-11-05 DIAGNOSIS — J9690 Respiratory failure, unspecified, unspecified whether with hypoxia or hypercapnia: Secondary | ICD-10-CM | POA: Diagnosis present

## 2018-11-05 DIAGNOSIS — Z8349 Family history of other endocrine, nutritional and metabolic diseases: Secondary | ICD-10-CM | POA: Diagnosis not present

## 2018-11-05 DIAGNOSIS — J9621 Acute and chronic respiratory failure with hypoxia: Secondary | ICD-10-CM | POA: Diagnosis not present

## 2018-11-05 DIAGNOSIS — J441 Chronic obstructive pulmonary disease with (acute) exacerbation: Secondary | ICD-10-CM | POA: Diagnosis present

## 2018-11-05 DIAGNOSIS — Z8249 Family history of ischemic heart disease and other diseases of the circulatory system: Secondary | ICD-10-CM | POA: Diagnosis not present

## 2018-11-05 DIAGNOSIS — Z951 Presence of aortocoronary bypass graft: Secondary | ICD-10-CM | POA: Diagnosis not present

## 2018-11-05 LAB — GLUCOSE, CAPILLARY
Glucose-Capillary: 157 mg/dL — ABNORMAL HIGH (ref 70–99)
Glucose-Capillary: 384 mg/dL — ABNORMAL HIGH (ref 70–99)
Glucose-Capillary: 278 mg/dL — ABNORMAL HIGH (ref 70–99)
Glucose-Capillary: 236 mg/dL — ABNORMAL HIGH (ref 70–99)

## 2018-11-05 MED ORDER — LIDOCAINE 5 % EX PTCH
1.0000 | MEDICATED_PATCH | CUTANEOUS | Status: DC
  Administered 2018-11-05 – 2018-11-06 (×2): 1 via TRANSDERMAL
  Filled 2018-11-05 (×2): qty 1

## 2018-11-05 NOTE — Progress Notes (Addendum)
PROGRESS NOTE  Mary [name] PNT:[id number] DOB: 1950-12-10 DOA: 11/03/2018 PCP: Celene Squibb, MD   LOS: 0 days   Brief Narrative / Interim history: Mary [name] is a 68 y.o. female with medical history significant for COPD, type 2 diabetes, hypertension, hyperlipidemia, chronic diastolic CHF, atrial fibrillation on Eliquis presents to the ED with 6 weeks of cough productive of greenish and yellowish sputum and progressive dyspnea with wheezing over the last few days.  She says her home breathing treatments have helped with her dyspnea however has continued chest congestion.  She also reports recurrent falls at home and right lateral rib pain after a recent fall which limits her ability to take adequate deep inspirations.  She reports sick contacts in her grandchildren who have had recent cold symptoms.  She denies any subjective fevers, abdominal pain, diarrhea or constipation, or dysuria.  Subjective: -Feeling a little bit better however still complains of intermittent coughing spells.  Was hypoxic again last night requiring 5 L nasal cannula.  Denies any chest pain, complains of rib pain every time she takes a deep breath or coughs.  Worked with physical therapy yesterday, recommended SNF  Assessment & Plan: Principal Problem:   Acute on chronic respiratory failure with hypoxia ([place]) Active Problems:   Diabetes mellitus (HCC)   Hyperlipidemia   COPD (chronic obstructive pulmonary disease) (HCC)   Diastolic congestive heart failure (HCC)   Atrial fibrillation (HCC)   CAD (coronary artery disease)   HTN (hypertension)   Rib pain on right side   Principal Problem Acute on chronic hypoxic respiratory failure due to bilateral lower lobe pneumonia as well as COPD exacerbation -Patient was started on antibiotics with ceftriaxone and azithromycin, continue.  Cultures not obtained on admission -Continue respiratory support with supplemental oxygen, Pulmicort, change albuterol to levalbuterol due  to history of A. fib, start prednisone for wheezing -Patient tells me that she has been having oxygen at home in the past when she was first diagnosed with COPD but currently is not requiring--Per patient.  On further discussion today, apparently she was on 4 L at home, and at 1 point she states that she was given the option to not use it anymore and she has done so.  I suspect chronic hypoxia as the main etiology of her weakness and recurrent falls at home.  She will need oxygen at home moving forward.  She is refusing SNF and we will maximize home health therapies on discharge. -Patient not yet back to baseline, requiring 5 L nasal cannula remaining tachypneic with medical activities, requires ongoing inpatient stay  Active Problems Permanent A. fib -Continue metoprolol for rate control, continue Eliquis for anticoagulation -Given how much time she fell in the past, she may be at high risk for bleeding with Eliquis.  Recurrent falls/deconditioning with rib fractures -Patient has been falling more so in the last several weeks, has had 3 falls, most recent one a week ago where she fell on her right side. 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  Allow a diet.  Hypertension -Keep on diltiazem and metoprolol for A. fib, closely monitor blood pressure.  Holding parameters placed on diltiazem   Scheduled Meds: .  apixaban  5 mg Oral BID  . aspirin EC  81 mg Oral Daily  . atorvastatin  40 mg Oral Daily  . budesonide (PULMICORT) nebulizer solution  0.25 mg Nebulization BID  . diltiazem  120 mg Oral Daily  . feeding supplement (ENSURE ENLIVE)  237 mL Oral BID BM  . insulin aspart  0-9 Units Subcutaneous TID WC  . ipratropium  0.5 mg Nebulization Q6H  . levalbuterol  1.25 mg Nebulization Q6H  . lidocaine  1 patch Transdermal Q24H  . mouth rinse  15 mL Mouth Rinse BID  . metoprolol tartrate  12.5 mg Oral BID  . multivitamin with minerals  1 tablet Oral Daily  . predniSONE  40 mg Oral Q breakfast   Continuous Infusions: . azithromycin    . cefTRIAXone (ROCEPHIN)  IV 1 g (11/05/18 0905)   PRN Meds:.acetaminophen **OR** acetaminophen, albuterol, gabapentin, guaiFENesin-dextromethorphan, levalbuterol, oxyCODONE   DVT prophylaxis: Eliquis Code Status: DNR Family Communication: No family present at bedside Disposition Plan: To be determined, likely home within 24-48 hours if continues to improve at the same rate  Consultants:   None  Procedures:   None   Antimicrobials:  Ceftriaxone 1/10 >>  Azithromycin 1/10 >>  Objective: Vitals:   11/05/18 0146 11/05/18 0532 11/05/18 0755 11/05/18 0906  BP:  108/69  (!) 93/52  Pulse:  98  (!) 104  Resp:  20    Temp:  98.5 F (36.9 C)    TempSrc:  Oral    SpO2: 95% 96% 94%   Weight:  55.4 kg    Height:        Intake/Output Summary (Last 24 hours) at 11/05/2018 1033 Last data filed at 11/05/2018 0900 Gross per 24 hour  Intake 480 ml  Output -  Net 480 ml   Filed Weights   11/03/18 1327 11/04/18 0611 11/05/18 0532  Weight: 54.4 kg 53.1 kg 55.4 kg    Examination:  Constitutional: More comfortable this morning but still tachypneic Eyes: No scleral icterus seen ENMT: Moist mucous membranes Neck: normal, supple Respiratory: Distant breath sounds, and expiratory wheezing improved today, no crackles heard.  Slightly  tachypneic Cardiovascular: Irregularly irregular, no peripheral edema.  Good peripheral pulses Abdomen: Soft, nontender, nondistended, positive bowel sounds Musculoskeletal: no clubbing / cyanosis.  Skin: No rashes appreciated Neurologic: No focal deficits Psychiatric: Normal judgment and insight. Alert and oriented x 3. Normal mood.    Data Reviewed: I have independently reviewed following labs and imaging studies   CBC: Recent Labs  Lab 11/03/18 1504 11/04/18 0552  WBC 14.7* 11.4*  NEUTROABS 11.8*  --   HGB 9.0* 8.0*  HCT 29.0* 25.7*  MCV 92.7 90.8  PLT 305 109   Basic Metabolic Panel: Recent Labs  Lab 11/03/18 1504 11/04/18 0552  NA 135 135  K 3.8 3.9  CL 97* 99  CO2 27 28  GLUCOSE 91 103*  BUN 22 17  CREATININE 0.90 0.83  CALCIUM 8.8* 8.5*   GFR: Estimated Creatinine Clearance: 55.9 mL/min (by C-G formula based on SCr of 0.83 mg/dL). Liver Function Tests: Recent Labs  Lab 11/03/18 1504  AST 20  ALT 15  ALKPHOS 57  BILITOT 0.5  PROT 7.5  ALBUMIN 3.4*   No results for input(s):  LIPASE, AMYLASE in the last 168 hours. No results for input(s): AMMONIA in the last 168 hours. Coagulation Profile: No results for input(s): INR, PROTIME in the last 168 hours. Cardiac Enzymes: No results for input(s): CKTOTAL, CKMB, CKMBINDEX, TROPONINI in the last 168 hours. BNP (last 3 results) No results for input(s): PROBNP in the last 8760 hours. HbA1C: No results for input(s): HGBA1C in the last 72 hours. CBG: Recent Labs  Lab 11/04/18 0728 11/04/18 1139 11/04/18 1608 11/04/18 2154 11/05/18 0751  GLUCAP 100* 222* 191* 391* 157*   Lipid Profile: No results for input(s): CHOL, HDL, LDLCALC, TRIG, CHOLHDL, LDLDIRECT in the last 72 hours. Thyroid Function Tests: No results for input(s): TSH, T4TOTAL, FREET4, T3FREE, THYROIDAB in the last 72 hours. Anemia Panel: No results for input(s): VITAMINB12, FOLATE, FERRITIN, TIBC, IRON, RETICCTPCT in the last 72  hours. Urine analysis:    Component Value Date/Time   COLORURINE YELLOW 04/06/2018 1311   APPEARANCEUR CLEAR 04/06/2018 1311   LABSPEC 1.008 04/06/2018 1311   PHURINE 6.0 04/06/2018 1311   GLUCOSEU NEGATIVE 04/06/2018 1311   HGBUR NEGATIVE 04/06/2018 1311   BILIRUBINUR NEGATIVE 04/06/2018 1311   KETONESUR NEGATIVE 04/06/2018 1311   PROTEINUR NEGATIVE 04/06/2018 1311   UROBILINOGEN 0.2 02/17/2015 2040   NITRITE NEGATIVE 04/06/2018 1311   LEUKOCYTESUR NEGATIVE 04/06/2018 1311   Sepsis Labs: Invalid input(s): PROCALCITONIN, LACTICIDVEN  No results found for this or any previous visit (from the past 240 hour(s)).    Radiology Studies: Dg Chest 2 View  Result Date: 11/03/2018 CLINICAL DATA:  Recent fall with right-sided chest pain, initial encounter EXAM: CHEST - 2 VIEW COMPARISON:  04/06/2018 FINDINGS: Cardiac shadow is stable. Postsurgical changes are again noted. Patchy bibasilar infiltrates are seen. Hyperinflation is noted consistent with COPD. No acute bony abnormality is noted. IMPRESSION: Patchy bibasilar infiltrates are noted. Electronically Signed   By: Inez Catalina M.D.   On: 11/03/2018 14:03   Dg Ribs Unilateral Right  Result Date: 11/03/2018 CLINICAL DATA:  Recent fall with right-sided chest pain, initial encounter EXAM: RIGHT RIBS - 2 VIEW COMPARISON:  None. FINDINGS: Mildly displaced fracture of the right eighth through tenth ribs are noted laterally. No underlying pneumothorax is seen. No other focal abnormality is noted. IMPRESSION: Mildly displaced fractures of the right eighth through tenth ribs are seen without complicating factors. Electronically Signed   By: Inez Catalina M.D.   On: 11/03/2018 17:45    Marzetta Board, MD, PhD [facility]  Contact via  [email or web address]  [place] P: [phone]  F: [phone]

## 2018-11-06 LAB — GLUCOSE, CAPILLARY
Glucose-Capillary: 189 mg/dL — ABNORMAL HIGH (ref 70–99)
Glucose-Capillary: 361 mg/dL — ABNORMAL HIGH (ref 70–99)

## 2018-11-06 MED ORDER — PREDNISONE 20 MG PO TABS: 20.0000 mg | ORAL_TABLET | Freq: Every day | ORAL | 0 refills | Status: DC

## 2018-11-06 MED ORDER — CEFDINIR 300 MG PO CAPS: 300.0000 mg | ORAL_CAPSULE | Freq: Two times a day (BID) | ORAL | 0 refills | Status: DC

## 2018-11-06 MED ORDER — GUAIFENESIN-DM 100-10 MG/5ML PO SYRP: 5.0000 mL | ORAL_SOLUTION | ORAL | 0 refills | Status: DC | PRN

## 2018-11-06 MED ORDER — APIXABAN 5 MG PO TABS: 5.0000 mg | ORAL_TABLET | Freq: Two times a day (BID) | ORAL | 0 refills | Status: DC

## 2018-11-06 NOTE — Progress Notes (Signed)
IV removed, WNL. D/C instructions given to pt via [name], RN. Verbalized understanding. Oxygen delivered to room via [facility]. Pt awaiting family friend to transport home.

## 2018-11-06 NOTE — Discharge Summary (Signed)
Physician Discharge Summary  Mary [name] VOJ:[id number] DOB: 12-31-1950 DOA: 11/03/2018  PCP: Celene Squibb, MD  Admit date: 11/03/2018 Discharge date: 11/06/2018  Admitted From: Home Disposition: Home (refusing SNF)  Recommendations for Outpatient Follow-up:  1. Follow up with PCP in 1-2 weeks 2. Complete antibiotics with Omnicef as well as a prednisone taper on discharge  Home Health: Physical therapy Equipment/Devices: None  Discharge Condition: Stable CODE STATUS: DNR Diet recommendation: Regular  HPI: Per admitting MD, Mary [name] is a 68 y.o. female with medical history significant for COPD, type 2 diabetes, hypertension, hyperlipidemia, chronic diastolic CHF, atrial fibrillation on Eliquis presents to the ED with 6 weeks of cough productive of greenish and yellowish sputum and progressive dyspnea with wheezing over the last few days.  She says her home breathing treatments have helped with her dyspnea however has continued chest congestion.  She also reports recurrent falls at home and right lateral rib pain after a recent fall which limits her ability to take adequate deep inspirations.  She reports sick contacts in her grandchildren who have had recent cold symptoms.  She denies any subjective fevers, abdominal pain, diarrhea or constipation, or dysuria. ED Course:  Initial vitals showed BP 115/60, pulse 108, RR 24, temp 97.9 Fahrenheit, SPO2 92% on room air decreased to 85% on room air requiring supplemental oxygen via [place]. Labs are notable for WBC 14.7, hemoglobin 9.0 (8.4 on 04/08/2018), platelets 305.  Chemistry panel was largely unremarkable. 2 view chest x-ray showed prior sternotomy changes, hyperinflated lung fields, and bibasilar infiltrates. The patient was given albuterol DuoNeb treatments and started on IV levofloxacin.  The hospitalist team was consulted to admit for further management.  Hospital Course: Principal Problem Acute on chronic hypoxic respiratory failure due to  bilateral lower lobe pneumonia as well as COPD exacerbation -Patient was started on antibiotics with ceftriaxone and azithromycin, improved clinically, was transitioned to [facility] on discharge.  Cultures not obtained on admission.  She was also placed on supportive treatment with oxygen, nebulizers, as well as steroids for wheezing.  Her respiratory status returned to baseline, and she will be discharged home in stable condition with a short course of antibiotics and a prednisone taper.  Patient tells me that she has been on oxygen in the past however it appears that at 1 point she was given the option not to use it anymore and she has chosen to do so.  I suspect chronic hypoxia is the main etiology of her weakness and recurrent falls.  She qualifies for home oxygen due to her COPD and this was prescribed on discharge.  Active Problems Permanent A. Fib -Continue metoprolol for rate control, continue Eliquis for anticoagulation Recurrent falls/deconditioning with rib fractures -Patient has been falling more so in the last several weeks, has had 3 falls, most recent one a week ago where she fell on her right side.  Continue supportive management Type 2 diabetes mellitus -resume home medications Hyperlipidemia -Continue atorvastatin Coronary artery disease with history of CABG -No ACS type chest pain, continue aspirin and atorvastatin Chronic diastolic CHF -resume home medications Hypertension-resume home medications   Discharge Diagnoses:  Principal Problem:   Acute on chronic respiratory failure with hypoxia (HCC) Active Problems:   Diabetes mellitus (HCC)   Hyperlipidemia   COPD (chronic obstructive pulmonary disease) (HCC)   Diastolic congestive heart failure (HCC)   Atrial fibrillation (HCC)   CAD (coronary artery disease)   HTN (hypertension)   Rib pain on right side  Respiratory failure [facility])     Discharge Instructions   Allergies as of 11/06/2018      Reactions   Adhesive  [tape] Other (See Comments)   Tears skin off.    Latex Other (See Comments)   In tape, tears skin off.   Zocor [simvastatin - High Dose] Nausea And Vomiting      Medication List    TAKE these medications   acetaminophen 500 MG tablet Commonly known as:  TYLENOL Take 500 mg by mouth every 6 (six) hours as needed for mild pain.   albuterol 108 (90 Base) MCG/ACT inhaler Commonly known as:  PROVENTIL HFA;VENTOLIN HFA Inhale 2 puffs into the lungs every 6 (six) hours as needed for shortness of breath.   apixaban 5 MG Tabs tablet Commonly known as:  ELIQUIS Take 1 tablet (5 mg total) by mouth 2 (two) times daily.   aspirin EC 81 MG tablet Take 81 mg by mouth daily.   atorvastatin 40 MG tablet Commonly known as:  LIPITOR TAKE ONE TABLET BY MOUTH DAILY.   cefdinir 300 MG capsule Commonly known as:  OMNICEF Take 1 capsule (300 mg total) by mouth 2 (two) times daily.   diltiazem 120 MG tablet Commonly known as:  CARDIZEM Take 120 mg by mouth daily.   furosemide 40 MG tablet Commonly known as:  LASIX TAKE ONE TABLET BY MOUTH ONCE DAILY.   FUSION PLUS PO Take 1 tablet by mouth daily.   gabapentin 100 MG capsule Commonly known as:  NEURONTIN Take 100 mg by mouth every 8 (eight) hours as needed (back pain).   glipiZIDE 2.5 MG 24 hr tablet Commonly known as:  GLUCOTROL XL 2.5 mg daily.   guaiFENesin-dextromethorphan 100-10 MG/5ML syrup Commonly known as:  ROBITUSSIN DM Take 5 mLs by mouth every 4 (four) hours as needed for cough.   JANUVIA 100 MG tablet Generic drug:  sitaGLIPtin TAKE ONE TABLET BY MOUTH DAILY.   metoprolol tartrate 25 MG tablet Commonly known as:  LOPRESSOR Take 0.5 tablets (12.5 mg total) by mouth 2 (two) times daily. KEEP OV.   potassium chloride SA 20 MEQ tablet Commonly known as:  K-DUR,KLOR-CON Take 1 tablet (20 mEq total) by mouth daily.   predniSONE 20 MG tablet Commonly known as:  DELTASONE Take 1 tablet (20 mg total) by mouth daily  with breakfast. Start taking on:  November 07, 2018   SYMBICORT 160-4.5 MCG/ACT inhaler Generic drug:  budesonide-formoterol INHALE 2 PUFFS INTO THE LUNGS TWICE DAILY. RINSE MOUTH AFTER USE.            Durable Medical Equipment  (From admission, onward)         Start     Ordered   11/06/18 0844  For home use only DME oxygen  Once    Question Answer Comment  Mode or (Route) Nasal cannula   Liters per Minute 2   Frequency Continuous (stationary and portable oxygen unit needed)   Oxygen delivery system Gas      11/06/18 0845          Consultations:  None   Procedures/Studies:  Dg Chest 2 View  Result Date: 11/03/2018 CLINICAL DATA:  Recent fall with right-sided chest pain, initial encounter EXAM: CHEST - 2 VIEW COMPARISON:  04/06/2018 FINDINGS: Cardiac shadow is stable. Postsurgical changes are again noted. Patchy bibasilar infiltrates are seen. Hyperinflation is noted consistent with COPD. No acute bony abnormality is noted. IMPRESSION: Patchy bibasilar infiltrates are noted. Electronically Signed   By: Elta [place]  Lukens M.D.   On: 11/03/2018 14:03   Dg Ribs Unilateral Right  Result Date: 11/03/2018 CLINICAL DATA:  Recent fall with right-sided chest pain, initial encounter EXAM: RIGHT RIBS - 2 VIEW COMPARISON:  None. FINDINGS: Mildly displaced fracture of the right eighth through tenth ribs are noted laterally. No underlying pneumothorax is seen. No other focal abnormality is noted. IMPRESSION: Mildly displaced fractures of the right eighth through tenth ribs are seen without complicating factors. Electronically Signed   By: Inez Catalina M.D.   On: 11/03/2018 17:45     Subjective: - no chest pain, shortness of breath, no abdominal pain, nausea or vomiting.   Discharge Exam: Vitals:   11/06/18 0734 11/06/18 0803  BP:  126/76  Pulse:  (!) 105  Resp:    Temp:    SpO2: 92% 94%    General: Pt is alert, awake, not in acute distress Cardiovascular: Irregular, no murmurs  appreciated Respiratory: CTA bilaterally, no wheezing, no rhonchi, distant breath sounds Abdominal: Soft, NT, ND, bowel sounds + Extremities: no edema, no cyanosis  The results of significant diagnostics from this hospitalization (including imaging, microbiology, ancillary and laboratory) are listed below for reference.     Microbiology: No results found for this or any previous visit (from the past 240 hour(s)).   Labs: BNP (last 3 results) Recent Labs    11/15/17 1146 01/07/18 1909 01/10/18 0817  BNP 277.0* 269.0* 409.8*   Basic Metabolic Panel: Recent Labs  Lab 11/03/18 1504 11/04/18 0552  NA 135 135  K 3.8 3.9  CL 97* 99  CO2 27 28  GLUCOSE 91 103*  BUN 22 17  CREATININE 0.90 0.83  CALCIUM 8.8* 8.5*   Liver Function Tests: Recent Labs  Lab 11/03/18 1504  AST 20  ALT 15  ALKPHOS 57  BILITOT 0.5  PROT 7.5  ALBUMIN 3.4*   No results for input(s): LIPASE, AMYLASE in the last 168 hours. No results for input(s): AMMONIA in the last 168 hours. CBC: Recent Labs  Lab 11/03/18 1504 11/04/18 0552  WBC 14.7* 11.4*  NEUTROABS 11.8*  --   HGB 9.0* 8.0*  HCT 29.0* 25.7*  MCV 92.7 90.8  PLT 305 258   Cardiac Enzymes: No results for input(s): CKTOTAL, CKMB, CKMBINDEX, TROPONINI in the last 168 hours. BNP: Invalid input(s): POCBNP CBG: Recent Labs  Lab 11/05/18 1214 11/05/18 1716 11/05/18 2206 11/06/18 0744 11/06/18 1118  GLUCAP 384* 278* 236* 189* 361*   D-Dimer No results for input(s): DDIMER in the last 72 hours. Hgb A1c No results for input(s): HGBA1C in the last 72 hours. Lipid Profile No results for input(s): CHOL, HDL, LDLCALC, TRIG, CHOLHDL, LDLDIRECT in the last 72 hours. Thyroid function studies No results for input(s): TSH, T4TOTAL, T3FREE, THYROIDAB in the last 72 hours.  Invalid input(s): FREET3 Anemia work up No results for input(s): VITAMINB12, FOLATE, FERRITIN, TIBC, IRON, RETICCTPCT in the last 72 hours. Urinalysis    Component  Value Date/Time   COLORURINE YELLOW 04/06/2018 1311   APPEARANCEUR CLEAR 04/06/2018 1311   LABSPEC 1.008 04/06/2018 1311   PHURINE 6.0 04/06/2018 1311   GLUCOSEU NEGATIVE 04/06/2018 1311   HGBUR NEGATIVE 04/06/2018 1311   BILIRUBINUR NEGATIVE 04/06/2018 1311   KETONESUR NEGATIVE 04/06/2018 1311   PROTEINUR NEGATIVE 04/06/2018 1311   UROBILINOGEN 0.2 02/17/2015 2040   NITRITE NEGATIVE 04/06/2018 1311   LEUKOCYTESUR NEGATIVE 04/06/2018 1311   Sepsis Labs Invalid input(s): PROCALCITONIN,  WBC,  LACTICIDVEN   Time coordinating discharge: 35 minutes  SIGNED:  Marzetta Board, MD  [facility] 11/06/2018, 1:30 PM

## 2018-11-06 NOTE — Care Management Note (Signed)
Case Management Note  Patient Details  Name: Mary [name] MRN: [id number] Date of Birth: 03-08-1951  Subjective/Objective:  Patient to be discharged per MD order. Orders in place for home health services. CMS Medicare.gov Compare Post Acute Care list reviewed with patient and since she will also require DME O2 she prefers [facility]. Referral placed with [place], O2 to be delivered. Family to transport.                   Action/Plan:   Expected Discharge Date:  11/06/18               Expected Discharge Plan:  [place]  In-House Referral:     Discharge planning Services  CM Consult  Post Acute Care Choice:  Home Health, Durable Medical Equipment Choice offered to:  Patient  DME Arranged:  Oxygen DME Agency:  [place]:  RN, PT, Nurse's Aide [facility] Agency:  [place]  Status of Service:  Completed, signed off  If discussed at [place] of Stay Meetings, dates discussed:    Additional Comments:  Latanya Maudlin, RN 11/06/2018, 11:35 AM

## 2018-11-06 NOTE — Progress Notes (Signed)
SATURATION QUALIFICATIONS: (This note is used to comply with regulatory documentation for home oxygen)  Patient Saturations on Room Air at Rest = 94%  Patient Saturations on Room Air while Ambulating = 86%  Patient Saturations on 2 Liters of oxygen while Ambulating = 91-95%  Please briefly explain why patient needs home oxygen:  Pt's O2 sat drops to mid 80s and HR increases to 120s-140s with minimal exertion on room air.

## 2018-11-07 ENCOUNTER — Encounter (HOSPITAL_COMMUNITY): Payer: Self-pay

## 2018-11-07 ENCOUNTER — Other Ambulatory Visit: Payer: Self-pay

## 2018-11-07 ENCOUNTER — Other Ambulatory Visit (HOSPITAL_COMMUNITY): Payer: Self-pay

## 2018-11-07 ENCOUNTER — Emergency Department (HOSPITAL_COMMUNITY): Payer: Medicare Other

## 2018-11-07 ENCOUNTER — Inpatient Hospital Stay (HOSPITAL_COMMUNITY)
Admission: EM | Admit: 2018-11-07 | Discharge: 2018-11-10 | DRG: 308 | Disposition: A | Discharge status: Home Health | Payer: Medicare Other | Attending: Internal Medicine | Admitting: Internal Medicine

## 2018-11-07 DIAGNOSIS — I2510 Atherosclerotic heart disease of native coronary artery without angina pectoris: Secondary | ICD-10-CM | POA: Diagnosis present

## 2018-11-07 DIAGNOSIS — J449 Chronic obstructive pulmonary disease, unspecified: Secondary | ICD-10-CM | POA: Diagnosis present

## 2018-11-07 DIAGNOSIS — I4891 Unspecified atrial fibrillation: Secondary | ICD-10-CM | POA: Diagnosis present

## 2018-11-07 DIAGNOSIS — I5033 Acute on chronic diastolic (congestive) heart failure: Secondary | ICD-10-CM | POA: Diagnosis not present

## 2018-11-07 DIAGNOSIS — I10 Essential (primary) hypertension: Secondary | ICD-10-CM | POA: Diagnosis not present

## 2018-11-07 DIAGNOSIS — J181 Lobar pneumonia, unspecified organism: Secondary | ICD-10-CM | POA: Diagnosis present

## 2018-11-07 DIAGNOSIS — E1141 Type 2 diabetes mellitus with diabetic mononeuropathy: Secondary | ICD-10-CM

## 2018-11-07 DIAGNOSIS — E1140 Type 2 diabetes mellitus with diabetic neuropathy, unspecified: Secondary | ICD-10-CM | POA: Diagnosis present

## 2018-11-07 DIAGNOSIS — Z91048 Other nonmedicinal substance allergy status: Secondary | ICD-10-CM

## 2018-11-07 DIAGNOSIS — R296 Repeated falls: Secondary | ICD-10-CM | POA: Diagnosis present

## 2018-11-07 DIAGNOSIS — J189 Pneumonia, unspecified organism: Secondary | ICD-10-CM | POA: Diagnosis not present

## 2018-11-07 DIAGNOSIS — Z79899 Other long term (current) drug therapy: Secondary | ICD-10-CM

## 2018-11-07 DIAGNOSIS — I25708 Atherosclerosis of coronary artery bypass graft(s), unspecified, with other forms of angina pectoris: Secondary | ICD-10-CM | POA: Diagnosis not present

## 2018-11-07 DIAGNOSIS — Z91040 Latex allergy status: Secondary | ICD-10-CM

## 2018-11-07 DIAGNOSIS — Z66 Do not resuscitate: Secondary | ICD-10-CM | POA: Diagnosis present

## 2018-11-07 DIAGNOSIS — Z9981 Dependence on supplemental oxygen: Secondary | ICD-10-CM

## 2018-11-07 DIAGNOSIS — Z951 Presence of aortocoronary bypass graft: Secondary | ICD-10-CM | POA: Diagnosis not present

## 2018-11-07 DIAGNOSIS — R002 Palpitations: Secondary | ICD-10-CM | POA: Diagnosis not present

## 2018-11-07 DIAGNOSIS — Z888 Allergy status to other drugs, medicaments and biological substances status: Secondary | ICD-10-CM

## 2018-11-07 DIAGNOSIS — J9621 Acute and chronic respiratory failure with hypoxia: Secondary | ICD-10-CM | POA: Diagnosis present

## 2018-11-07 DIAGNOSIS — R0602 Shortness of breath: Secondary | ICD-10-CM | POA: Diagnosis not present

## 2018-11-07 DIAGNOSIS — Z8249 Family history of ischemic heart disease and other diseases of the circulatory system: Secondary | ICD-10-CM | POA: Diagnosis not present

## 2018-11-07 DIAGNOSIS — Z7901 Long term (current) use of anticoagulants: Secondary | ICD-10-CM

## 2018-11-07 DIAGNOSIS — Z9089 Acquired absence of other organs: Secondary | ICD-10-CM

## 2018-11-07 DIAGNOSIS — Z87891 Personal history of nicotine dependence: Secondary | ICD-10-CM

## 2018-11-07 DIAGNOSIS — J440 Chronic obstructive pulmonary disease with acute lower respiratory infection: Secondary | ICD-10-CM | POA: Diagnosis present

## 2018-11-07 DIAGNOSIS — Z9181 History of falling: Secondary | ICD-10-CM

## 2018-11-07 DIAGNOSIS — Z833 Family history of diabetes mellitus: Secondary | ICD-10-CM | POA: Diagnosis not present

## 2018-11-07 DIAGNOSIS — R000 Tachycardia, unspecified: Secondary | ICD-10-CM | POA: Diagnosis not present

## 2018-11-07 DIAGNOSIS — M5117 Intervertebral disc disorders with radiculopathy, lumbosacral region: Secondary | ICD-10-CM | POA: Diagnosis present

## 2018-11-07 DIAGNOSIS — I110 Hypertensive heart disease with heart failure: Secondary | ICD-10-CM | POA: Diagnosis present

## 2018-11-07 DIAGNOSIS — I4510 Unspecified right bundle-branch block: Secondary | ICD-10-CM | POA: Diagnosis not present

## 2018-11-07 DIAGNOSIS — I4821 Permanent atrial fibrillation: Secondary | ICD-10-CM | POA: Diagnosis not present

## 2018-11-07 DIAGNOSIS — J90 Pleural effusion, not elsewhere classified: Secondary | ICD-10-CM | POA: Diagnosis not present

## 2018-11-07 DIAGNOSIS — Z7982 Long term (current) use of aspirin: Secondary | ICD-10-CM

## 2018-11-07 DIAGNOSIS — Z8349 Family history of other endocrine, nutritional and metabolic diseases: Secondary | ICD-10-CM | POA: Diagnosis not present

## 2018-11-07 DIAGNOSIS — E785 Hyperlipidemia, unspecified: Secondary | ICD-10-CM | POA: Diagnosis present

## 2018-11-07 DIAGNOSIS — Z90711 Acquired absence of uterus with remaining cervical stump: Secondary | ICD-10-CM

## 2018-11-07 DIAGNOSIS — I361 Nonrheumatic tricuspid (valve) insufficiency: Secondary | ICD-10-CM | POA: Diagnosis not present

## 2018-11-07 DIAGNOSIS — E119 Type 2 diabetes mellitus without complications: Secondary | ICD-10-CM

## 2018-11-07 DIAGNOSIS — Z7984 Long term (current) use of oral hypoglycemic drugs: Secondary | ICD-10-CM

## 2018-11-07 DIAGNOSIS — Z7951 Long term (current) use of inhaled steroids: Secondary | ICD-10-CM

## 2018-11-07 LAB — CBC WITH DIFFERENTIAL/PLATELET
ABS IMMATURE GRANULOCYTES: 0.13 10*3/uL — AB (ref 0.00–0.07)
Basophils Absolute: 0 10*3/uL (ref 0.0–0.1)
Basophils Relative: 0 %
Eosinophils Absolute: 0.1 10*3/uL (ref 0.0–0.5)
Eosinophils Relative: 1 %
HCT: 31.2 % — ABNORMAL LOW (ref 36.0–46.0)
Hemoglobin: 9.3 g/dL — ABNORMAL LOW (ref 12.0–15.0)
Immature Granulocytes: 1 %
Lymphocytes Relative: 12 %
Lymphs Abs: 1.9 10*3/uL (ref 0.7–4.0)
MCH: 27.8 pg (ref 26.0–34.0)
MCHC: 29.8 g/dL — ABNORMAL LOW (ref 30.0–36.0)
MCV: 93.4 fL (ref 80.0–100.0)
Monocytes Absolute: 1.3 10*3/uL — ABNORMAL HIGH (ref 0.1–1.0)
Monocytes Relative: 8 %
Neutro Abs: 13.3 10*3/uL — ABNORMAL HIGH (ref 1.7–7.7)
Neutrophils Relative %: 78 %
Platelets: 332 10*3/uL (ref 150–400)
RBC: 3.34 MIL/uL — ABNORMAL LOW (ref 3.87–5.11)
RDW: 16.7 % — ABNORMAL HIGH (ref 11.5–15.5)
WBC: 16.8 10*3/uL — ABNORMAL HIGH (ref 4.0–10.5)
nRBC: 0 % (ref 0.0–0.2)

## 2018-11-07 LAB — COMPREHENSIVE METABOLIC PANEL
ALK PHOS: 56 U/L (ref 38–126)
ALT: 16 U/L (ref 0–44)
AST: 19 U/L (ref 15–41)
Albumin: 3.6 g/dL (ref 3.5–5.0)
Anion gap: 9 (ref 5–15)
BUN: 22 mg/dL (ref 8–23)
CALCIUM: 10 mg/dL (ref 8.9–10.3)
CO2: 30 mmol/L (ref 22–32)
Chloride: 98 mmol/L (ref 98–111)
Creatinine, Ser: 0.75 mg/dL (ref 0.44–1.00)
GFR calc Af Amer: >60 mL/min (ref >60)
GFR calc non Af Amer: >60 mL/min (ref >60)
Glucose, Bld: 149 mg/dL — ABNORMAL HIGH (ref 70–99)
Potassium: 3.9 mmol/L (ref 3.5–5.1)
Sodium: 137 mmol/L (ref 135–145)
TOTAL PROTEIN: 7.7 g/dL (ref 6.5–8.1)
Total Bilirubin: 0.4 mg/dL (ref 0.3–1.2)

## 2018-11-07 LAB — TROPONIN I: Troponin I: <0.03 ng/mL (ref <0.03)

## 2018-11-07 LAB — GLUCOSE, CAPILLARY
GLUCOSE-CAPILLARY: 163 mg/dL — AB (ref 70–99)
Glucose-Capillary: 101 mg/dL — ABNORMAL HIGH (ref 70–99)

## 2018-11-07 LAB — MRSA PCR SCREENING: MRSA by PCR: NEGATIVE

## 2018-11-07 MED ORDER — INSULIN ASPART 100 UNIT/ML ~~LOC~~ SOLN
0.0000 [IU] | Freq: Three times a day (TID) | SUBCUTANEOUS | Status: DC
  Administered 2018-11-07: 2 [IU] via SUBCUTANEOUS
  Administered 2018-11-08: 1 [IU] via SUBCUTANEOUS
  Administered 2018-11-08: 7 [IU] via SUBCUTANEOUS
  Administered 2018-11-08: 5 [IU] via SUBCUTANEOUS
  Administered 2018-11-09 – 2018-11-10 (×4): 2 [IU] via SUBCUTANEOUS

## 2018-11-07 MED ORDER — DILTIAZEM HCL ER COATED BEADS 120 MG PO CP24
120.0000 mg | ORAL_CAPSULE | Freq: Every day | ORAL | Status: DC
  Administered 2018-11-08: 120 mg via ORAL
  Filled 2018-11-07: qty 1

## 2018-11-07 MED ORDER — METOPROLOL TARTRATE 25 MG PO TABS
12.5000 mg | ORAL_TABLET | Freq: Two times a day (BID) | ORAL | Status: DC
  Administered 2018-11-07: 12.5 mg via ORAL
  Filled 2018-11-07: qty 1

## 2018-11-07 MED ORDER — METOPROLOL TARTRATE 25 MG PO TABS: 25.0000 mg | ORAL_TABLET | Freq: Once | ORAL | Status: DC

## 2018-11-07 MED ORDER — DILTIAZEM HCL 25 MG/5ML IV SOLN
10.0000 mg | Freq: Once | INTRAVENOUS | Status: AC
  Administered 2018-11-07: 10 mg via INTRAVENOUS
  Filled 2018-11-07: qty 5

## 2018-11-07 MED ORDER — FUROSEMIDE 10 MG/ML IJ SOLN
40.0000 mg | Freq: Once | INTRAMUSCULAR | Status: AC
  Administered 2018-11-07: 40 mg via INTRAVENOUS

## 2018-11-07 MED ORDER — ASPIRIN EC 81 MG PO TBEC
81.0000 mg | DELAYED_RELEASE_TABLET | Freq: Every day | ORAL | Status: DC
  Administered 2018-11-07 – 2018-11-10 (×4): 81 mg via ORAL
  Filled 2018-11-07 (×4): qty 1

## 2018-11-07 MED ORDER — METOPROLOL TARTRATE 25 MG PO TABS
ORAL_TABLET | ORAL | Status: AC
  Administered 2018-11-07: 25 mg via ORAL
  Filled 2018-11-07: qty 1

## 2018-11-07 MED ORDER — FUROSEMIDE 10 MG/ML IJ SOLN
INTRAMUSCULAR | Status: AC
  Filled 2018-11-07: qty 4

## 2018-11-07 MED ORDER — ACETAMINOPHEN 500 MG PO TABS
500.0000 mg | ORAL_TABLET | Freq: Four times a day (QID) | ORAL | Status: DC | PRN
  Administered 2018-11-07 – 2018-11-08 (×2): 500 mg via ORAL
  Filled 2018-11-07 (×2): qty 1

## 2018-11-07 MED ORDER — PREDNISONE 20 MG PO TABS
20.0000 mg | ORAL_TABLET | Freq: Every day | ORAL | Status: DC
  Administered 2018-11-08 – 2018-11-10 (×3): 20 mg via ORAL
  Filled 2018-11-07 (×3): qty 1

## 2018-11-07 MED ORDER — METOPROLOL TARTRATE 25 MG PO TABS: 12.5000 mg | ORAL_TABLET | Freq: Two times a day (BID) | ORAL | Status: DC

## 2018-11-07 MED ORDER — ORAL CARE MOUTH RINSE
15.0000 mL | Freq: Two times a day (BID) | OROMUCOSAL | Status: DC
  Administered 2018-11-07 – 2018-11-10 (×6): 15 mL via OROMUCOSAL

## 2018-11-07 MED ORDER — DILTIAZEM HCL 100 MG IV SOLR
5.0000 mg/h | INTRAVENOUS | Status: DC
  Administered 2018-11-07: 5 mg/h via INTRAVENOUS
  Filled 2018-11-07: qty 100

## 2018-11-07 MED ORDER — OXYCODONE-ACETAMINOPHEN 5-325 MG PO TABS
1.0000 | ORAL_TABLET | Freq: Once | ORAL | Status: AC
  Administered 2018-11-07: 1 via ORAL
  Filled 2018-11-07: qty 1

## 2018-11-07 MED ORDER — METOPROLOL TARTRATE 25 MG PO TABS
25.0000 mg | ORAL_TABLET | Freq: Two times a day (BID) | ORAL | Status: DC
  Administered 2018-11-07: 25 mg via ORAL

## 2018-11-07 MED ORDER — DILTIAZEM HCL 60 MG PO TABS: 120.0000 mg | ORAL_TABLET | Freq: Every day | ORAL | Status: DC

## 2018-11-07 MED ORDER — GUAIFENESIN-DM 100-10 MG/5ML PO SYRP: 5.0000 mL | ORAL_SOLUTION | ORAL | Status: DC | PRN

## 2018-11-07 MED ORDER — GABAPENTIN 100 MG PO CAPS
100.0000 mg | ORAL_CAPSULE | Freq: Three times a day (TID) | ORAL | Status: DC | PRN
  Administered 2018-11-08: 100 mg via ORAL
  Filled 2018-11-07: qty 1

## 2018-11-07 MED ORDER — FUROSEMIDE 10 MG/ML IJ SOLN
40.0000 mg | Freq: Two times a day (BID) | INTRAMUSCULAR | Status: DC
  Administered 2018-11-08: 40 mg via INTRAVENOUS
  Filled 2018-11-07: qty 4

## 2018-11-07 MED ORDER — APIXABAN 5 MG PO TABS
5.0000 mg | ORAL_TABLET | Freq: Two times a day (BID) | ORAL | Status: DC
  Administered 2018-11-07 – 2018-11-10 (×6): 5 mg via ORAL
  Filled 2018-11-07 (×7): qty 1

## 2018-11-07 MED ORDER — CEFDINIR 300 MG PO CAPS
300.0000 mg | ORAL_CAPSULE | Freq: Two times a day (BID) | ORAL | Status: DC
  Administered 2018-11-07 – 2018-11-10 (×6): 300 mg via ORAL
  Filled 2018-11-07 (×6): qty 1

## 2018-11-07 MED ORDER — ATORVASTATIN CALCIUM 40 MG PO TABS
40.0000 mg | ORAL_TABLET | Freq: Every day | ORAL | Status: DC
  Administered 2018-11-07 – 2018-11-10 (×4): 40 mg via ORAL
  Filled 2018-11-07 (×4): qty 1

## 2018-11-07 NOTE — ED Notes (Signed)
Increased Cardizem by 5mg /hr due to vitals.

## 2018-11-07 NOTE — ED Provider Notes (Signed)
[facility] EMERGENCY DEPARTMENT Provider Note   CSN: [id number] Arrival date & time: 11/07/18  1127     History   Chief Complaint Chief Complaint  Patient presents with  . Shortness of Breath    HPI Mary [name] is a 68 y.o. female.  Patient complains of shortness of breath.  She was just discharged from the hospital with pneumonia.  She also complains of palpitations  The history is provided by the patient. No language interpreter was used.  Shortness of Breath  Severity:  Moderate Onset quality:  Sudden Duration:  5 hours Timing:  Constant Progression:  Worsening Chronicity:  New Context: activity   Relieved by:  Nothing Worsened by:  Nothing Ineffective treatments:  None tried Associated symptoms: no abdominal pain, no chest pain, no cough, no headaches and no rash     Past Medical History:  Diagnosis Date  . Anxiety   . Arthritis   . Atrial fibrillation ([place])   . Bronchial asthma   . CHF (congestive heart failure) ([place])   . Chronic pain    Bacl pain, Disc L5-S1- Dr. Joya Salm in [place]  . COPD (chronic obstructive pulmonary disease) ([place])   . DDD (degenerative disc disease)    Radicular symptoms  . Diabetes mellitus   . History of stress test 06/2011   Abnormal myocardial perfusion study.  Marland Kitchen Hx of echocardiogram    Was interpreted by Dr Doylene Canard that showed an Ef in the 50-60% range with grade 1 diastolic dysfunction. she had moderate MR, biatrial enlargement, moderate TR and at that time estimated RV systolic pressure was 43 mm.  . Hyperlipidemia   . Hypertension   . Neuropathy   . Shortness of breath   . Tachycardia     Patient Active Problem List   Diagnosis Date Noted  . Respiratory failure ([place]) 11/05/2018  . Rib pain on right side 11/03/2018  . Atrial fibrillation with RVR ([place]) 04/06/2018  . Cellulitis in diabetic foot ([place]) 04/06/2018  . Acute on chronic diastolic CHF (congestive heart failure) ([place]) 01/10/2018  . Sepsis ([place]) 01/08/2018  . AKI  (acute kidney injury) ([place]) 01/08/2018  . Acute metabolic encephalopathy 01/74/9449  . Acute respiratory failure with hypoxia and hypercarbia ([place]) 01/08/2018  . Transaminasemia 01/08/2018  . Acute respiratory failure with hypercapnia ([place]) 01/07/2018  . Hyponatremia 01/07/2018  . Anemia 01/07/2018  . Lobar pneumonia ([place]) 11/16/2017  . Chronic diastolic CHF (congestive heart failure) ([place]) 11/16/2017  . Acute respiratory failure with hypoxia ([place]) 11/15/2017  . Malnutrition of moderate degree 02/20/2017  . Acute on chronic respiratory failure with hypoxia ([place]) 02/18/2017  . Hypomagnesemia 02/18/2017  . COPD with acute exacerbation ([place]) 11/04/2016  . Left carotid bruit 09/13/2015  . Noncompliance with medications 03/19/2015  . Gastroenteritis 03/19/2015  . Seasonal allergies 01/25/2015  . Chest pain 05/16/2014  . HTN (hypertension) 05/16/2014  . PSVT (paroxysmal supraventricular tachycardia) ([place]) 05/16/2014  . Acute diastolic CHF (congestive heart failure) ([place]) 05/15/2014  . CAD (coronary artery disease) 05/15/2014  . RBBB 04/25/2014  . Benign skin lesion 03/07/2014  . 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 Atrial fibrillation with rapid ventricular response ([place]) 01/06/2014  . Leukocytosis 01/06/2014  . Fall at home 09/18/2013  . Hypotension 09/18/2013  . Recurrent falls 09/18/2013  . Tinnitus of left ear 02/19/2013  . Altered mental status 12/06/2012  . Diastolic congestive heart failure ([place]) 09/10/2012  . Atrial fibrillation ([place]) 09/10/2012  . DDD (degenerative disc disease), lumbar 08/26/2012  . Atypical  mole 04/14/2012  . Seborrheic keratosis 04/14/2012  . Depression 12/09/2011  . COPD (chronic obstructive pulmonary disease) ([place]) 06/27/2011  . Anxiety 06/27/2011  . Chronic pain 05/27/2011  . Diabetes mellitus ([place]) 05/26/2011  . Hyperlipidemia 05/26/2011    Past Surgical History:  Procedure Laterality Date  . ABDOMINAL HYSTERECTOMY     partial  . Breast cyst removed     . CARPAL TUNNEL RELEASE     x 2  . CORONARY ARTERY BYPASS GRAFT N/A 01/09/2014   Procedure: CORONARY ARTERY BYPASS GRAFTING (CABG) x 3 using endoscopically harvested right saphenous vein and left internal mammary artery and closure of left atrial appendage;  Surgeon: Ivin Poot, MD;  Location: [facility];  Service: Open Heart Surgery;  Laterality: N/A;  patient has preop IA BP   . INTRAOPERATIVE TRANSESOPHAGEAL ECHOCARDIOGRAM N/A 01/09/2014   Procedure: INTRAOPERATIVE TRANSESOPHAGEAL ECHOCARDIOGRAM;  Surgeon: Ivin Poot, MD;  Location: [facility];  Service: Open Heart Surgery;  Laterality: N/A;  . LEFT HEART CATHETERIZATION WITH CORONARY ANGIOGRAM N/A 01/07/2014   Procedure: LEFT HEART CATHETERIZATION WITH CORONARY ANGIOGRAM;  Surgeon: Lorretta Harp, MD;  Location: [facility] CATH LAB;  Service: Cardiovascular;  Laterality: N/A;  . TONSILLECTOMY    . TUBAL LIGATION       OB History    Gravida  1   Para  1   Term  1   Preterm      AB      Living  0     SAB      TAB      Ectopic      Multiple      Live Births               Home Medications    Prior to Admission medications   Medication Sig Start Date End Date Taking? Authorizing Provider  acetaminophen (TYLENOL) 500 MG tablet Take 500 mg by mouth every 6 (six) hours as needed for mild pain.    [provider]  albuterol (PROVENTIL HFA;VENTOLIN HFA) 108 (90 Base) MCG/ACT inhaler Inhale 2 puffs into the lungs every 6 (six) hours as needed for shortness of breath.     [provider]  apixaban (ELIQUIS) 5 MG TABS tablet Take 1 tablet (5 mg total) by mouth 2 (two) times daily. 11/06/18   Caren Griffins, MD  aspirin EC 81 MG tablet Take 81 mg by mouth daily.    [provider]  atorvastatin (LIPITOR) 40 MG tablet TAKE ONE TABLET BY MOUTH DAILY. 07/25/18   Troy Sine, MD  cefdinir (OMNICEF) 300 MG capsule Take 1 capsule (300 mg total) by mouth 2 (two) times daily. 11/06/18   Caren Griffins, MD    diltiazem (CARDIZEM) 120 MG tablet Take 120 mg by mouth daily.    [provider]  furosemide (LASIX) 40 MG tablet TAKE ONE TABLET BY MOUTH ONCE DAILY. 06/23/18   Troy Sine, MD  gabapentin (NEURONTIN) 100 MG capsule Take 100 mg by mouth every 8 (eight) hours as needed (back pain).    [provider]  glipiZIDE (GLUCOTROL XL) 2.5 MG 24 hr tablet 2.5 mg daily.  02/28/16   [provider]  guaiFENesin-dextromethorphan (ROBITUSSIN DM) 100-10 MG/5ML syrup Take 5 mLs by mouth every 4 (four) hours as needed for cough. 11/06/18   Caren Griffins, MD  Iron-FA-B Cmp-C-Biot-Probiotic (FUSION PLUS PO) Take 1 tablet by mouth daily.    [provider]  JANUVIA 100 MG tablet TAKE  ONE TABLET BY MOUTH DAILY. 07/19/15   Alycia Rossetti, MD  metoprolol tartrate (LOPRESSOR) 25 MG tablet Take 0.5 tablets (12.5 mg total) by mouth 2 (two) times daily. KEEP OV. 05/30/18   Troy Sine, MD  potassium chloride SA (K-DUR,KLOR-CON) 20 MEQ tablet Take 1 tablet (20 mEq total) by mouth daily. 11/06/16   Thurnell Lose, MD  predniSONE (DELTASONE) 20 MG tablet Take 1 tablet (20 mg total) by mouth daily with breakfast. 11/07/18   Gherghe, Vella Redhead, MD  SYMBICORT 160-4.5 MCG/ACT inhaler INHALE 2 PUFFS INTO THE LUNGS TWICE DAILY. RINSE MOUTH AFTER USE. 06/04/15   Alycia Rossetti, MD    Family History Family History  Problem Relation Age of Onset  . Diabetes Mother   . Heart disease Mother   . Diabetes Sister   . Hypertension Sister   . Hyperlipidemia Sister   . Diabetes Brother   . Heart disease Brother   . Diabetes Brother   . Heart disease Brother     Social History Social History   Tobacco Use  . Smoking status: Former Smoker    Packs/day: 1.50    Years: 30.00    Pack years: 45.00    Types: Cigarettes    Last attempt to quit: 05/26/2013    Years since quitting: 5.4  . Smokeless tobacco: Never Used  . Tobacco comment: smells of heavy smoke 02/18/17  Substance Use  Topics  . Alcohol use: No  . Drug use: No     Allergies   Adhesive [tape]; Latex; and Zocor [simvastatin - high dose]   Review of Systems Review of Systems  Constitutional: Negative for appetite change and fatigue.  HENT: Negative for congestion, ear discharge and sinus pressure.   Eyes: Negative for discharge.  Respiratory: Positive for shortness of breath. Negative for cough.   Cardiovascular: Positive for palpitations. Negative for chest pain.  Gastrointestinal: Negative for abdominal pain and diarrhea.  Genitourinary: Negative for frequency and hematuria.  Musculoskeletal: Negative for back pain.  Skin: Negative for rash.  Neurological: Negative for seizures and headaches.  Psychiatric/Behavioral: Negative for hallucinations.     Physical Exam Updated Vital Signs BP 130/88   Pulse (!) 135   Temp 97.7 F (36.5 C) (Oral)   Resp 20   Ht 5\' 7"  (1.702 m)   Wt 55 kg   SpO2 96%   BMI 18.99 kg/m   Physical Exam Vitals signs and nursing note reviewed.  Constitutional:      Appearance: She is well-developed.  HENT:     Head: Normocephalic.     Nose: Nose normal.  Eyes:     General: No scleral icterus.    Conjunctiva/sclera: Conjunctivae normal.  Neck:     Musculoskeletal: Neck supple.     Thyroid: No thyromegaly.  Cardiovascular:     Heart sounds: No murmur. No friction rub. No gallop.      Comments: Rapid irregular heartbeat Pulmonary:     Breath sounds: No stridor. No wheezing or rales.  Chest:     Chest wall: No tenderness.  Abdominal:     General: There is no distension.     Tenderness: There is no abdominal tenderness. There is no rebound.  Musculoskeletal: Normal range of motion.  Lymphadenopathy:     Cervical: No cervical adenopathy.  Skin:    Findings: No erythema or rash.  Neurological:     Mental Status: She is oriented to person, place, and time.     Motor: No abnormal  muscle tone.     Coordination: Coordination normal.  Psychiatric:         Behavior: Behavior normal.      ED Treatments / Results  Labs (all labs ordered are listed, but only abnormal results are displayed) Labs Reviewed  CBC WITH DIFFERENTIAL/PLATELET - Abnormal; Notable for the following components:      Result Value   WBC 16.8 (*)    RBC 3.34 (*)    Hemoglobin 9.3 (*)    HCT 31.2 (*)    MCHC 29.8 (*)    RDW 16.7 (*)    Neutro Abs 13.3 (*)    Monocytes Absolute 1.3 (*)    Abs Immature Granulocytes 0.13 (*)    All other components within normal limits  COMPREHENSIVE METABOLIC PANEL - Abnormal; Notable for the following components:   Glucose, Bld 149 (*)    All other components within normal limits  TROPONIN I    EKG None  Radiology Dg Chest Portable 1 View  Result Date: 11/07/2018 CLINICAL DATA:  Pneumonia. EXAM: PORTABLE CHEST 1 VIEW COMPARISON:  11/03/2018. FINDINGS: Prior CABG. Left atrial appendage clip in stable position. Cardiomegaly. Diffuse bilateral pulmonary infiltrates/edema, progressed from prior exam. Body suggest congestive heart failure. Bilateral pneumonia could also present this fashion. Pleural effusions have improved from prior exam. No pneumothorax. IMPRESSION: 1. Prior CABG. Cardiomegaly with progressive bilateral pulmonary infiltrates/edema. Findings suggest congestive heart failure. Bilateral pneumonia can not be excluded. 2.  Improved bilateral pleural effusions. Electronically Signed   By: Marcello Moores  Register   On: 11/07/2018 12:30    Procedures Procedures (including critical care time)  Medications Ordered in ED Medications  diltiazem (CARDIZEM) 100 mg in dextrose 5 % 100 mL (1 mg/mL) infusion (10 mg/hr Intravenous Rate/Dose Change 11/07/18 1310)  diltiazem (CARDIZEM) injection 10 mg (10 mg Intravenous Given 11/07/18 1219)  diltiazem (CARDIZEM) injection 10 mg (10 mg Intravenous Given 11/07/18 1351)     Initial Impression / Assessment and Plan / ED Course  I have reviewed the triage vital signs and the nursing  notes.  Pertinent labs & imaging results that were available during my care of the patient were reviewed by me and considered in my medical decision making (see chart for details).     CRITICAL CARE Performed by: Milton Ferguson Total critical care time: 40 minutes Critical care time was exclusive of separately billable procedures and treating other patients. Critical care was necessary to treat or prevent imminent or life-threatening deterioration. Critical care was time spent personally by me on the following activities: development of treatment plan with patient and/or surrogate as well as nursing, discussions with consultants, evaluation of patient's response to treatment, examination of patient, obtaining history from patient or surrogate, ordering and performing treatments and interventions, ordering and review of laboratory studies, ordering and review of radiographic studies, pulse oximetry and re-evaluation of patient's condition.  Patient in rapid A. fib.  She is placed on a Cardizem drip and will be admitted to medicine Final Clinical Impressions(s) / ED Diagnoses   Final diagnoses:  Atrial fibrillation with RVR [facility])    ED Discharge Orders    None       Milton Ferguson, MD 11/07/18 1354

## 2018-11-07 NOTE — ED Triage Notes (Signed)
Patient discharged from [facility] yesterday with pneumonia. Pt sent home with prescription for antibiotics. Patient unable to obtain meds. Pt stated she is having increased SOB at home.

## 2018-11-07 NOTE — H&P (Signed)
History and Physical    Mary [name] WIO:[id number] DOB: [birth year] DOA: 11/07/2018  I have briefly reviewed the patient's prior medical records in [place]  PCP: Celene Squibb, MD  Patient coming from: home  Chief Complaint: Shortness of breath, palpitations  HPI: Mary [name] is a 68 y.o. female with medical history significant of COPD on home oxygen 2 L nasal cannula, chronic pain, permanent A. fib on anticoagulation with Eliquis, chronic diastolic CHF, hypertension, hyperlipidemia, who was recently admitted to the hospital with community-acquired pneumonia in the setting of recurrent falls and rib fractures, and discharged home on 1/12.  Patient was feeling well at the time of discharge, went home last night, everything seemed to be back to baseline, ate dinner went to bed.  When she woke up this morning, she felt all of a sudden short of breath as well as felt to have palpitations.  She denies any fever or chills, complains of a cough which has been all along since her pneumonia diagnosis but did not get worse, no abdominal pain, no nausea or vomiting.  She denies any leg swelling.  ED Course: In the ED she was found to be in rapid A. fib, she is afebrile and blood pressure is normal.  She is satting 100% on nasal cannula, was placed on 4 L on admission.  Blood work shows normal renal function, white count of 16.8, hemoglobin 9.3.  Troponin is negative.  Chest x-ray shows findings of bilateral pulmonary infiltrates consistent with edema and fluid overload.  She was placed on Cardizem drip, and we are asked to admit  Review of Systems: As per HPI otherwise 10 point review of systems negative.   Past Medical History:  Diagnosis Date  . Anxiety   . Arthritis   . Atrial fibrillation ([place])   . Bronchial asthma   . CHF (congestive heart failure) ([place])   . Chronic pain    Bacl pain, Disc L5-S1- Dr. Joya Salm in [place]  . COPD (chronic obstructive pulmonary disease) ([place])   . DDD  (degenerative disc disease)    Radicular symptoms  . Diabetes mellitus   . History of stress test 06/2011   Abnormal myocardial perfusion study.  Marland Kitchen Hx of echocardiogram    Was interpreted by Dr Doylene Canard that showed an Ef in the 50-60% range with grade 1 diastolic dysfunction. she had moderate MR, biatrial enlargement, moderate TR and at that time estimated RV systolic pressure was 43 mm.  . Hyperlipidemia   . Hypertension   . Neuropathy   . Shortness of breath   . Tachycardia     Past Surgical History:  Procedure Laterality Date  . ABDOMINAL HYSTERECTOMY     partial  . Breast cyst removed    . CARPAL TUNNEL RELEASE     x 2  . CORONARY ARTERY BYPASS GRAFT N/A 01/09/2014   Procedure: CORONARY ARTERY BYPASS GRAFTING (CABG) x 3 using endoscopically harvested right saphenous vein and left internal mammary artery and closure of left atrial appendage;  Surgeon: Ivin Poot, MD;  Location: [facility];  Service: Open Heart Surgery;  Laterality: N/A;  patient has preop IA BP   . INTRAOPERATIVE TRANSESOPHAGEAL ECHOCARDIOGRAM N/A 01/09/2014   Procedure: INTRAOPERATIVE TRANSESOPHAGEAL ECHOCARDIOGRAM;  Surgeon: Ivin Poot, MD;  Location: [facility];  Service: Open Heart Surgery;  Laterality: N/A;  . LEFT HEART CATHETERIZATION WITH CORONARY ANGIOGRAM N/A 01/07/2014   Procedure: LEFT HEART CATHETERIZATION WITH CORONARY ANGIOGRAM;  Surgeon: Lorretta Harp, MD;  Location: [facility];  Service: Cardiovascular;  Laterality: N/A;  . TONSILLECTOMY    . TUBAL LIGATION       reports that she quit smoking about 5 years ago. Her smoking use included cigarettes. She has a 45.00 pack-year smoking history. She has never used smokeless tobacco. She reports that she does not drink alcohol or use drugs.  Allergies  Allergen Reactions  . Adhesive [Tape] Other (See Comments)    Tears skin off.   . Latex Other (See Comments)    In tape, tears skin off.  . Zocor [Simvastatin - High Dose] Nausea And Vomiting     Family History  Problem Relation Age of Onset  . Diabetes Mother   . Heart disease Mother   . Diabetes Sister   . Hypertension Sister   . Hyperlipidemia Sister   . Diabetes Brother   . Heart disease Brother   . Diabetes Brother   . Heart disease Brother     Prior to Admission medications   Medication Sig Start Date End Date Taking? Authorizing Provider  acetaminophen (TYLENOL) 500 MG tablet Take 500 mg by mouth every 6 (six) hours as needed for mild pain.    [provider]  albuterol (PROVENTIL HFA;VENTOLIN HFA) 108 (90 Base) MCG/ACT inhaler Inhale 2 puffs into the lungs every 6 (six) hours as needed for shortness of breath.     [provider]  apixaban (ELIQUIS) 5 MG TABS tablet Take 1 tablet (5 mg total) by mouth 2 (two) times daily. 11/06/18   Caren Griffins, MD  aspirin EC 81 MG tablet Take 81 mg by mouth daily.    [provider]  atorvastatin (LIPITOR) 40 MG tablet TAKE ONE TABLET BY MOUTH DAILY. 07/25/18   Troy Sine, MD  cefdinir (OMNICEF) 300 MG capsule Take 1 capsule (300 mg total) by mouth 2 (two) times daily. 11/06/18   Caren Griffins, MD  diltiazem (CARDIZEM) 120 MG tablet Take 120 mg by mouth daily.    [provider]  furosemide (LASIX) 40 MG tablet TAKE ONE TABLET BY MOUTH ONCE DAILY. 06/23/18   Troy Sine, MD  gabapentin (NEURONTIN) 100 MG capsule Take 100 mg by mouth every 8 (eight) hours as needed (back pain).    [provider]  glipiZIDE (GLUCOTROL XL) 2.5 MG 24 hr tablet 2.5 mg daily.  02/28/16   [provider]  guaiFENesin-dextromethorphan (ROBITUSSIN DM) 100-10 MG/5ML syrup Take 5 mLs by mouth every 4 (four) hours as needed for cough. 11/06/18   Caren Griffins, MD  Iron-FA-B Cmp-C-Biot-Probiotic (FUSION PLUS PO) Take 1 tablet by mouth daily.    [provider]  JANUVIA 100 MG tablet TAKE ONE TABLET BY MOUTH DAILY. 07/19/15   Alycia Rossetti, MD  metoprolol tartrate (LOPRESSOR) 25  MG tablet Take 0.5 tablets (12.5 mg total) by mouth 2 (two) times daily. KEEP OV. 05/30/18   Troy Sine, MD  potassium chloride SA (K-DUR,KLOR-CON) 20 MEQ tablet Take 1 tablet (20 mEq total) by mouth daily. 11/06/16   Thurnell Lose, MD  predniSONE (DELTASONE) 20 MG tablet Take 1 tablet (20 mg total) by mouth daily with breakfast. 11/07/18   Torri Langston, Vella Redhead, MD  SYMBICORT 160-4.5 MCG/ACT inhaler INHALE 2 PUFFS INTO THE LUNGS TWICE DAILY. RINSE MOUTH AFTER USE. 06/04/15   Alycia Rossetti, MD    Physical Exam: Vitals:   11/07/18 1200 11/07/18 1220 11/07/18 1300 11/07/18 1345  BP: 132/86 125/63 (!) 145/85 130/88  Pulse: (!) 142  (!) 123 (!) 135  Resp: (!) 27 (!) 23 (!) 25 20  Temp:      TempSrc:      SpO2: 100%  98% 96%  Weight:      Height:          Constitutional: no significant distress but appears tachypneic Eyes: PERRL, lids and conjunctivae normal ENMT: Mucous membranes are moist.  Neck: normal, supple, no masses, no thyromegaly Respiratory: Bibasilar crackles, distant breath sounds, no wheezing heard.  Increased respiratory effort Cardiovascular: Irregularly irregular, tachycardic, no murmurs appreciated.  No peripheral edema Abdomen: no tenderness, no masses palpated. Bowel sounds positive.  Musculoskeletal: no clubbing / cyanosis. Normal muscle tone.  Skin: no rashes, lesions, ulcers. No induration Neurologic: CN 2-12 grossly intact. Strength 5/5 in all 4.  Psychiatric: Normal judgment and insight. Alert and oriented x 3. Normal mood.   Labs on Admission: I have personally reviewed following labs and imaging studies  CBC: Recent Labs  Lab 11/03/18 1504 11/04/18 0552 11/07/18 1205  WBC 14.7* 11.4* 16.8*  NEUTROABS 11.8*  --  13.3*  HGB 9.0* 8.0* 9.3*  HCT 29.0* 25.7* 31.2*  MCV 92.7 90.8 93.4  PLT 305 258 382   Basic Metabolic Panel: Recent Labs  Lab 11/03/18 1504 11/04/18 0552 11/07/18 1205  NA 135 135 137  K 3.8 3.9 3.9  CL 97* 99 98  CO2 27 28 30    GLUCOSE 91 103* 149*  BUN 22 17 22   CREATININE 0.90 0.83 0.75  CALCIUM 8.8* 8.5* 10.0   GFR: Estimated Creatinine Clearance: 57.6 mL/min (by C-G formula based on SCr of 0.75 mg/dL). Liver Function Tests: Recent Labs  Lab 11/03/18 1504 11/07/18 1205  AST 20 19  ALT 15 16  ALKPHOS 57 56  BILITOT 0.5 0.4  PROT 7.5 7.7  ALBUMIN 3.4* 3.6   No results for input(s): LIPASE, AMYLASE in the last 168 hours. No results for input(s): AMMONIA in the last 168 hours. Coagulation Profile: No results for input(s): INR, PROTIME in the last 168 hours. Cardiac Enzymes: Recent Labs  Lab 11/07/18 1205  TROPONINI <0.03   BNP (last 3 results) No results for input(s): PROBNP in the last 8760 hours. HbA1C: No results for input(s): HGBA1C in the last 72 hours. CBG: Recent Labs  Lab 11/05/18 1214 11/05/18 1716 11/05/18 2206 11/06/18 0744 11/06/18 1118  GLUCAP 384* 278* 236* 189* 361*   Lipid Profile: No results for input(s): CHOL, HDL, LDLCALC, TRIG, CHOLHDL, LDLDIRECT in the last 72 hours. Thyroid Function Tests: No results for input(s): TSH, T4TOTAL, FREET4, T3FREE, THYROIDAB in the last 72 hours. Anemia Panel: No results for input(s): VITAMINB12, FOLATE, FERRITIN, TIBC, IRON, RETICCTPCT in the last 72 hours. Urine analysis:    Component Value Date/Time   COLORURINE YELLOW 04/06/2018 1311   APPEARANCEUR CLEAR 04/06/2018 1311   LABSPEC 1.008 04/06/2018 1311   PHURINE 6.0 04/06/2018 1311   GLUCOSEU NEGATIVE 04/06/2018 1311   HGBUR NEGATIVE 04/06/2018 1311   BILIRUBINUR NEGATIVE 04/06/2018 1311   KETONESUR NEGATIVE 04/06/2018 1311   PROTEINUR NEGATIVE 04/06/2018 1311   UROBILINOGEN 0.2 02/17/2015 2040   NITRITE NEGATIVE 04/06/2018 1311   LEUKOCYTESUR NEGATIVE 04/06/2018 1311     Radiological Exams on Admission: Dg Chest Portable 1 View  Result Date: 11/07/2018 CLINICAL DATA:  Pneumonia. EXAM: PORTABLE CHEST 1 VIEW COMPARISON:  11/03/2018. FINDINGS: Prior CABG. Left atrial  appendage clip in stable position. Cardiomegaly. Diffuse bilateral pulmonary infiltrates/edema, progressed from prior exam. Body suggest congestive heart failure.  Bilateral pneumonia could also present this fashion. Pleural effusions have improved from prior exam. No pneumothorax. IMPRESSION: 1. Prior CABG. Cardiomegaly with progressive bilateral pulmonary infiltrates/edema. Findings suggest congestive heart failure. Bilateral pneumonia can not be excluded. 2.  Improved bilateral pleural effusions. Electronically Signed   By: Marcello Moores  Register   On: 11/07/2018 12:30    EKG: Independently reviewed.  A. fib with RVR  Assessment/Plan Active Problems:   Diabetes mellitus (H[facility])   COPD (chronic obstructive pulmonary disease) (H[facility])   CAD (coronary artery disease)   Acute on chronic respiratory failure with hypoxia (HCC)   Acute on chronic diastolic CHF (congestive heart failure) (H[facility])   Atrial fibrillation with RVR ([facility])   Principal Problem Acute on chronic hypoxic respiratory failure due to pulmonary edema likely in the setting of acute on chronic diastolic CHF as well as A. fib with RVR -Patient recently hospitalized and discharged yesterday, respiratory status is stable at that time.  Suspect she went into rapid A. fib which might have precipitated pulmonary edema -Patient started on Cardizem infusion, continue, I have also started her home metoprolol at a little bit of a higher dose of 25 mg x 1 -We will give Lasix 40 mg IV x1, and resume IV Lasix tomorrow morning -Admit to stepdown -For her permanent A. fib continue Eliquis  Active Problems Type 2 diabetes mellitus -Placed on sliding scale  Hyperlipidemia -Continue atorvastatin  Coronary artery disease with history of CABG -No chest pain, continue aspirin and atorvastatin  Hypertension -Continue home metoprolol, now on diltiazem drip  Recurrent falls/deconditioning with rib fractures -Recently diagnosed with pneumonia, continue home  antibiotic, white count is 16 but potentially related to steroids -Of note, patient was recommended to go to SNF however she refused and chose to go home instead -Obtain PT again   DVT prophylaxis: Eliquis  Code Status: DNR  Family Communication: no family at bedside  Disposition Plan: admit to SDU Consults called: none     Marzetta Board, MD, PhD [facility]  Contact via [email or web address]  [facility] Office Info P: [phone]  F: [phone]   11/07/2018, 2:08 PM

## 2018-11-08 DIAGNOSIS — E785 Hyperlipidemia, unspecified: Secondary | ICD-10-CM

## 2018-11-08 DIAGNOSIS — I25708 Atherosclerosis of coronary artery bypass graft(s), unspecified, with other forms of angina pectoris: Secondary | ICD-10-CM | POA: Diagnosis not present

## 2018-11-08 DIAGNOSIS — J440 Chronic obstructive pulmonary disease with acute lower respiratory infection: Secondary | ICD-10-CM | POA: Diagnosis present

## 2018-11-08 DIAGNOSIS — J181 Lobar pneumonia, unspecified organism: Secondary | ICD-10-CM | POA: Diagnosis present

## 2018-11-08 DIAGNOSIS — E1140 Type 2 diabetes mellitus with diabetic neuropathy, unspecified: Secondary | ICD-10-CM | POA: Diagnosis not present

## 2018-11-08 DIAGNOSIS — E1340 Other specified diabetes mellitus with diabetic neuropathy, unspecified: Secondary | ICD-10-CM | POA: Diagnosis not present

## 2018-11-08 DIAGNOSIS — I5033 Acute on chronic diastolic (congestive) heart failure: Secondary | ICD-10-CM | POA: Diagnosis not present

## 2018-11-08 DIAGNOSIS — Z9181 History of falling: Secondary | ICD-10-CM | POA: Diagnosis not present

## 2018-11-08 DIAGNOSIS — Z8249 Family history of ischemic heart disease and other diseases of the circulatory system: Secondary | ICD-10-CM | POA: Diagnosis not present

## 2018-11-08 DIAGNOSIS — I361 Nonrheumatic tricuspid (valve) insufficiency: Secondary | ICD-10-CM | POA: Diagnosis not present

## 2018-11-08 DIAGNOSIS — Z66 Do not resuscitate: Secondary | ICD-10-CM | POA: Diagnosis present

## 2018-11-08 DIAGNOSIS — Z833 Family history of diabetes mellitus: Secondary | ICD-10-CM | POA: Diagnosis not present

## 2018-11-08 DIAGNOSIS — I2510 Atherosclerotic heart disease of native coronary artery without angina pectoris: Secondary | ICD-10-CM | POA: Diagnosis not present

## 2018-11-08 DIAGNOSIS — Z951 Presence of aortocoronary bypass graft: Secondary | ICD-10-CM | POA: Diagnosis not present

## 2018-11-08 DIAGNOSIS — I4891 Unspecified atrial fibrillation: Secondary | ICD-10-CM | POA: Diagnosis not present

## 2018-11-08 DIAGNOSIS — I482 Chronic atrial fibrillation, unspecified: Secondary | ICD-10-CM | POA: Diagnosis not present

## 2018-11-08 DIAGNOSIS — Z8349 Family history of other endocrine, nutritional and metabolic diseases: Secondary | ICD-10-CM | POA: Diagnosis not present

## 2018-11-08 DIAGNOSIS — J9621 Acute and chronic respiratory failure with hypoxia: Secondary | ICD-10-CM

## 2018-11-08 DIAGNOSIS — I4510 Unspecified right bundle-branch block: Secondary | ICD-10-CM | POA: Diagnosis present

## 2018-11-08 DIAGNOSIS — Z87891 Personal history of nicotine dependence: Secondary | ICD-10-CM | POA: Diagnosis not present

## 2018-11-08 DIAGNOSIS — I110 Hypertensive heart disease with heart failure: Secondary | ICD-10-CM | POA: Diagnosis present

## 2018-11-08 DIAGNOSIS — Z9089 Acquired absence of other organs: Secondary | ICD-10-CM | POA: Diagnosis not present

## 2018-11-08 DIAGNOSIS — Z90711 Acquired absence of uterus with remaining cervical stump: Secondary | ICD-10-CM | POA: Diagnosis not present

## 2018-11-08 DIAGNOSIS — I10 Essential (primary) hypertension: Secondary | ICD-10-CM

## 2018-11-08 DIAGNOSIS — Z7901 Long term (current) use of anticoagulants: Secondary | ICD-10-CM | POA: Diagnosis not present

## 2018-11-08 DIAGNOSIS — I4821 Permanent atrial fibrillation: Secondary | ICD-10-CM | POA: Diagnosis present

## 2018-11-08 DIAGNOSIS — J449 Chronic obstructive pulmonary disease, unspecified: Secondary | ICD-10-CM

## 2018-11-08 DIAGNOSIS — Z7982 Long term (current) use of aspirin: Secondary | ICD-10-CM | POA: Diagnosis not present

## 2018-11-08 DIAGNOSIS — R0602 Shortness of breath: Secondary | ICD-10-CM | POA: Diagnosis present

## 2018-11-08 DIAGNOSIS — Z9981 Dependence on supplemental oxygen: Secondary | ICD-10-CM | POA: Diagnosis not present

## 2018-11-08 DIAGNOSIS — M5117 Intervertebral disc disorders with radiculopathy, lumbosacral region: Secondary | ICD-10-CM | POA: Diagnosis present

## 2018-11-08 DIAGNOSIS — R296 Repeated falls: Secondary | ICD-10-CM | POA: Diagnosis present

## 2018-11-08 LAB — CBC
HCT: 30.1 % — ABNORMAL LOW (ref 36.0–46.0)
Hemoglobin: 9.1 g/dL — ABNORMAL LOW (ref 12.0–15.0)
MCH: 28.3 pg (ref 26.0–34.0)
MCHC: 30.2 g/dL (ref 30.0–36.0)
MCV: 93.8 fL (ref 80.0–100.0)
Platelets: 304 10*3/uL (ref 150–400)
RBC: 3.21 MIL/uL — ABNORMAL LOW (ref 3.87–5.11)
RDW: 16.2 % — ABNORMAL HIGH (ref 11.5–15.5)
WBC: 11.8 10*3/uL — ABNORMAL HIGH (ref 4.0–10.5)
nRBC: 0 % (ref 0.0–0.2)

## 2018-11-08 LAB — GLUCOSE, CAPILLARY
GLUCOSE-CAPILLARY: 325 mg/dL — AB (ref 70–99)
Glucose-Capillary: 142 mg/dL — ABNORMAL HIGH (ref 70–99)
Glucose-Capillary: 254 mg/dL — ABNORMAL HIGH (ref 70–99)
Glucose-Capillary: 339 mg/dL — ABNORMAL HIGH (ref 70–99)

## 2018-11-08 LAB — COMPREHENSIVE METABOLIC PANEL
ALK PHOS: 48 U/L (ref 38–126)
ALT: 13 U/L (ref 0–44)
AST: 13 U/L — ABNORMAL LOW (ref 15–41)
Albumin: 2.9 g/dL — ABNORMAL LOW (ref 3.5–5.0)
Anion gap: 8 (ref 5–15)
BILIRUBIN TOTAL: 0.5 mg/dL (ref 0.3–1.2)
BUN: 17 mg/dL (ref 8–23)
CALCIUM: 9.2 mg/dL (ref 8.9–10.3)
CO2: 32 mmol/L (ref 22–32)
Chloride: 99 mmol/L (ref 98–111)
Creatinine, Ser: 0.57 mg/dL (ref 0.44–1.00)
GFR calc Af Amer: >60 mL/min (ref >60)
GFR calc non Af Amer: >60 mL/min (ref >60)
Glucose, Bld: 129 mg/dL — ABNORMAL HIGH (ref 70–99)
Potassium: 3.7 mmol/L (ref 3.5–5.1)
Sodium: 139 mmol/L (ref 135–145)
Total Protein: 6.2 g/dL — ABNORMAL LOW (ref 6.5–8.1)

## 2018-11-08 MED ORDER — DIGOXIN 0.25 MG/ML IJ SOLN
0.2500 mg | Freq: Once | INTRAMUSCULAR | Status: AC
  Administered 2018-11-08: 0.25 mg via INTRAVENOUS
  Filled 2018-11-08: qty 2

## 2018-11-08 MED ORDER — FUROSEMIDE 10 MG/ML IJ SOLN
40.0000 mg | Freq: Every day | INTRAMUSCULAR | Status: DC
  Administered 2018-11-09: 40 mg via INTRAVENOUS
  Filled 2018-11-08: qty 4

## 2018-11-08 MED ORDER — METOPROLOL TARTRATE 5 MG/5ML IV SOLN
2.5000 mg | Freq: Four times a day (QID) | INTRAVENOUS | Status: DC | PRN
  Administered 2018-11-08 – 2018-11-09 (×3): 2.5 mg via INTRAVENOUS
  Filled 2018-11-08 (×3): qty 5

## 2018-11-08 MED ORDER — METOPROLOL TARTRATE 25 MG PO TABS
25.0000 mg | ORAL_TABLET | Freq: Two times a day (BID) | ORAL | Status: DC
  Administered 2018-11-08 – 2018-11-10 (×5): 25 mg via ORAL
  Filled 2018-11-08 (×5): qty 1

## 2018-11-08 MED ORDER — TRAMADOL HCL 50 MG PO TABS
25.0000 mg | ORAL_TABLET | Freq: Four times a day (QID) | ORAL | Status: DC | PRN
  Administered 2018-11-08 – 2018-11-10 (×4): 25 mg via ORAL
  Filled 2018-11-08 (×4): qty 1

## 2018-11-08 MED ORDER — LIDOCAINE 5 % EX PTCH
1.0000 | MEDICATED_PATCH | CUTANEOUS | Status: DC
  Administered 2018-11-08 – 2018-11-09 (×2): 1 via TRANSDERMAL
  Filled 2018-11-08 (×4): qty 1

## 2018-11-08 NOTE — Consult Note (Addendum)
Cardiology Consult    Patient ID: Mary [name]; [id number]; [birth year]   Admit date: 11/07/2018 Date of Consult: 11/08/2018  Primary Care Provider: Celene Squibb, MD Primary Cardiologist: Mary Majestic, MD    Patient Profile    Mary [name] is a 68 y.o. female with past medical history of CAD (s/p CABG in 12/2013 with LIMA-LAD, SVG-RI, and SVG-LCx), persistent atrial fibrillation (LAA clip at time of CABG), chronic diastolic CHF, HTN, and HLD who is being seen today for the evaluation of atrial fibrillation with RVR at the request of Mary [name].   History of Present Illness    Mary [name] was last examined by Mary [name] in 05/2018 but by review of records his actual office note is not available and only the scanned EKG at that time which demonstrates atrial fibrillation with known RBBB.    She was most recently admitted to [facility] from 11/03/2018 to 11/06/2018 for acute on chronic hypoxic respiratory failure in the setting of PNA and a COPD exacerbation. She initially required IV antibiotics and was transitioned to [facility] at the time of discharge. It does not appear that any changes were made to her cardiac medication regimen during that time and she was continued on Lopressor for rate control and Eliquis for anticoagulation.  She presented back to [facility] on 11/07/2018 reporting being unable to obtain her medications and developed worsening dyspnea. Initial labs showed WBC 16.8, Hgb 9.3, platelets 332, Na+ 137, K+ 3.9, and creatinine 0.75.  CXR showed cardiomegaly with progressive bilateral pulmonary infiltrates/edema concerning for CHF but bilateral pneumonia could not be excluded. EKG showed atrial fibrillation with RVR, heart rate 131.  She was started on IV Cardizem upon admission but this was transitioned to PO Cardizem CD 120mg  this AM and PTA Lopressor has been further titrated from 12.5mg  BID to 25mg  BID. Heart rates remain elevated in the 120's to 140's at the time of his encounter  with most recent BP at 105/89. Was also started on IV Lasix 40 mg daily at the time of admission but this was discontinued earlier this morning due to hypotension.  She reports having intermittent palpitations but says this has been a recurrent issue over the past several months. She has baseline 2 pillow orthopnea along with dyspnea on exertion occurring since recent hospital discharge. Denies any recent chest pain, PND, or lower extremity edema.   Past Medical History:  Diagnosis Date  . Anxiety   . Arthritis   . Atrial fibrillation ([place])   . Bronchial asthma   . CHF (congestive heart failure) ([place])   . Chronic pain    Bacl pain, Disc L5-S1- Mary [name] in [place]  . COPD (chronic obstructive pulmonary disease) ([place])   . DDD (degenerative disc disease)    Radicular symptoms  . Diabetes mellitus   . History of stress test 06/2011   Abnormal myocardial perfusion study.  Marland Kitchen Hx of echocardiogram    Was interpreted by Dr Doylene [name] that showed an Ef in the 50-60% range with grade 1 diastolic dysfunction. she had moderate MR, biatrial enlargement, moderate TR and at that time estimated RV systolic pressure was 43 mm.  . Hyperlipidemia   . Hypertension   . Neuropathy   . Shortness of breath   . Tachycardia     Past Surgical History:  Procedure Laterality Date  . ABDOMINAL HYSTERECTOMY     partial  . Breast cyst removed    . CARPAL TUNNEL RELEASE  x 2  . CORONARY ARTERY BYPASS GRAFT N/A 01/09/2014   Procedure: CORONARY ARTERY BYPASS GRAFTING (CABG) x 3 using endoscopically harvested right saphenous vein and left internal mammary artery and closure of left atrial appendage;  Surgeon: Ivin Poot, MD;  Location: [place];  Service: Open Heart Surgery;  Laterality: N/A;  patient has preop IA BP   . INTRAOPERATIVE TRANSESOPHAGEAL ECHOCARDIOGRAM N/A 01/09/2014   Procedure: INTRAOPERATIVE TRANSESOPHAGEAL ECHOCARDIOGRAM;  Surgeon: Ivin Poot, MD;  Location: [place];  Service: Open Heart  Surgery;  Laterality: N/A;  . LEFT HEART CATHETERIZATION WITH CORONARY ANGIOGRAM N/A 01/07/2014   Procedure: LEFT HEART CATHETERIZATION WITH CORONARY ANGIOGRAM;  Surgeon: Lorretta Harp, MD;  Location[facility] C[facility];  Service: Cardiovascular;  Laterality: N/A;  . TONSILLECTOMY    . TUBAL LIGATION         Inpatient Medications: Scheduled Meds: . apixaban  5 mg Oral BID  . aspirin EC  81 mg Oral Daily  . atorvastatin  40 mg Oral Daily  . cefdinir  300 mg Oral BID  . diltiazem  120 mg Oral Daily  . insulin aspart  0-9 Units Subcutaneous TID WC  . mouth rinse  15 mL Mouth Rinse BID  . metoprolol tartrate  25 mg Oral BID  . predniSONE  20 mg Oral Q breakfast   Continuous Infusions:  PRN Meds: acetaminophen, gabapentin, guaiFENesin-dextromethorphan  Allergies:    Allergies  Allergen Reactions  . Adhesive [Tape] Other (See Comments)    Tears skin off.   . Latex Other (See Comments)    In tape, tears skin off.  . Zocor [Simvastatin - High Dose] Nausea And Vomiting    Social History:   Social History   Socioeconomic History  . Marital status: Single    Spouse name: Not on file  . Number of children: 1  . Years of education: 27  . Highest education level: Not on file  Occupational History  . Not on file  Social Needs  . Financial resource strain: Not on file  . Food insecurity:    Worry: Not on file    Inability: Not on file  . Transportation needs:    Medical: Not on file    Non-medical: Not on file  Tobacco Use  . Smoking status: Former Smoker    Packs/day: 1.50    Years: 30.00    Pack years: 45.00    Types: Cigarettes    Last attempt to quit: 05/26/2013    Years since quitting: 5.4  . Smokeless tobacco: Never Used  . Tobacco comment: smells of heavy smoke 02/18/17  Substance and Sexual Activity  . Alcohol use: No  . Drug use: No  . Sexual activity: Yes    Birth control/protection: Surgical  Lifestyle  . Physical activity:    Days per week: Not on file     Minutes per session: Not on file  . Stress: Not on file  Relationships  . Social connections:    Talks on phone: Not on file    Gets together: Not on file    Attends religious service: Not on file    Active member of club or organization: Not on file    Attends meetings of clubs or organizations: Not on file    Relationship status: Not on file  . Intimate partner violence:    Fear of current or ex partner: Not on file    Emotionally abused: Not on file    Physically abused: Not on  file    Forced sexual activity: Not on file  Other Topics Concern  . Not on file  Social History Narrative   Lives at home with sister and sister-in-law   Caffeine use : drink 1 cup coffee in morning      Family History:    Family History  Problem Relation Age of Onset  . Diabetes Mother   . Heart disease Mother   . Diabetes Sister   . Hypertension Sister   . Hyperlipidemia Sister   . Diabetes Brother   . Heart disease Brother   . Diabetes Brother   . Heart disease Brother       Review of Systems    General:  No chills, fever, night sweats or weight changes.  Cardiovascular:  No chest pain or paroxysmal nocturnal dyspnea. Positive for palpitations, orthopnea, and dyspnea on exertion.  Dermatological: No rash, lesions/masses Respiratory: Positive for cough and dyspnea. Urologic: No hematuria, dysuria Abdominal:   No nausea, vomiting, diarrhea, bright red blood per rectum, melena, or hematemesis Neurologic:  No visual changes, wkns, changes in mental status. All other systems reviewed and are otherwise negative except as noted above.  Physical Exam/Data    Vitals:   11/08/18 0800 11/08/18 1100 11/08/18 1115 11/08/18 1200  BP: 116/73 (!) 98/53    Pulse: (!) 112 (!) 121 (!) 125 (!) 135  Resp: (!) 23 (!) 22 20 (!) 25  Temp:   99.1 F (37.3 C)   TempSrc:   Oral   SpO2: 94% 91% 91% (!) 89%  Weight:      Height:        Intake/Output Summary (Last 24 hours) at 11/08/2018 1215 Last data  filed at 11/07/2018 1831 Gross per 24 hour  Intake 46.59 ml  Output -  Net 46.59 ml   Filed Weights   11/07/18 1139 11/07/18 1633 11/08/18 0500  Weight: 55 kg 53.7 kg 52.9 kg   Body mass index is 18.27 kg/m.   General: Pleasant, Caucasian female appearing in NAD. Psych: Normal affect. Neuro: Alert and oriented X 3. Moves all extremities spontaneously. HEENT: Normal  Neck: Supple without bruits. JVD at 9cm. Lungs:  Resp regular and unlabored, rales along bases bilaterally. On 4L [place] at baseline.  Heart: Tachycardiac, irregularly irregular. no s3, s4, or murmurs. Abdomen: Soft, non-tender, non-distended, BS + x 4.  Extremities: No clubbing, cyanosis or lower extremity edema. DP/PT/Radials 2+ and equal bilaterally.   Labs/Studies     Relevant CV Studies:  Echocardiogram: 12/2017 Study Conclusions  - Left ventricle: The cavity size was normal. Wall thickness was   normal. Systolic function was normal. The estimated ejection   fraction was in the range of 50% to 55%. - Aortic valve: There was mild regurgitation. - Mitral valve: There was mild regurgitation. - Left atrium: The atrium was severely dilated. - Right ventricle: The cavity size was mildly dilated. Systolic   function was mildly reduced. - Right atrium: The atrium was severely dilated. - Tricuspid valve: There was moderate regurgitation. - Pulmonary arteries: PA peak pressure: 67 mm Hg (S).  Laboratory Data:  Chemistry Recent Labs  Lab 11/04/18 0552 11/07/18 1205 11/08/18 0426  NA 135 137 139  K 3.9 3.9 3.7  CL 99 98 99  CO2 28 30 32  GLUCOSE 103* 149* 129*  BUN 17 22 17   CREATININE 0.83 0.75 0.57  CALCIUM 8.5* 10.0 9.2  GFRNONAA >60 >60 >60  GFRAA >60 >60 >60  ANIONGAP 8 9 8  Recent Labs  Lab 11/03/18 1504 11/07/18 1205 11/08/18 0426  PROT 7.5 7.7 6.2*  ALBUMIN 3.4* 3.6 2.9*  AST 20 19 13*  ALT 15 16 13   ALKPHOS 57 56 48  BILITOT 0.5 0.4 0.5   Hematology Recent Labs  Lab  11/04/18 0552 11/07/18 1205 11/08/18 0426  WBC 11.4* 16.8* 11.8*  RBC 2.83* 3.34* 3.21*  HGB 8.0* 9.3* 9.1*  HCT 25.7* 31.2* 30.1*  MCV 90.8 93.4 93.8  MCH 28.3 27.8 28.3  MCHC 31.1 29.8* 30.2  RDW 16.0* 16.7* 16.2*  PLT 258 332 304   Cardiac Enzymes Recent Labs  Lab 11/07/18 1205  TROPONINI <0.03   No results for input(s): TROPIPOC in the last 168 hours.  BNPNo results for input(s): BNP, PROBNP in the last 168 hours.  DDimer No results for input(s): DDIMER in the last 168 hours.  Radiology/Studies:  Dg Chest Portable 1 View  Result Date: 11/07/2018 CLINICAL DATA:  Pneumonia. EXAM: PORTABLE CHEST 1 VIEW COMPARISON:  11/03/2018. FINDINGS: Prior CABG. Left atrial appendage clip in stable position. Cardiomegaly. Diffuse bilateral pulmonary infiltrates/edema, progressed from prior exam. Body suggest congestive heart failure. Bilateral pneumonia could also present this fashion. Pleural effusions have improved from prior exam. No pneumothorax. IMPRESSION: 1. Prior CABG. Cardiomegaly with progressive bilateral pulmonary infiltrates/edema. Findings suggest congestive heart failure. Bilateral pneumonia can not be excluded. 2.  Improved bilateral pleural effusions. Electronically Signed   By: Hermitage   On: 11/07/2018 12:30     Assessment & Plan    1. Atrial Fibrillation with RVR - The patient has a known history of permanent atrial fibrillation which has been rate controlled but she is currently admitted with acute on chronic hypoxic respiratory failure in the setting of recent PNA. Initially on IV Cardizem but was transitioned to PO Cardizem CD 120mg  this AM and PTA Lopressor has been further titrated from 12.5mg  BID to 25mg  BID. Heart rates remain elevated in the 120's to 140's at the time of his encounter with most recent BP at 105/89. Given she was switched to Cardizem CD, it can take several hours for this medication to have its full effect. Agree with titration of Lopressor and  will write for PRN Lopressor for HR sustaining greater than 120. If HR sustains greater than 130-140 bpm and BP does not allow for further administration of Lopressor, may need to consider IV Amiodarone for the purpose of rate-control alone as she has permanent atrial fibrillation.  - Denies any evidence of active bleeding. Continue Eliquis for anticoagulation.  2. Acute on Chronic Diastolic CHF - She had a preserved EF of 50 to 55% by most recent echocardiogram in 12/2017. Repeat imaging is pending. CXR on admission showed progressive bilateral pulmonary infiltrates/edema consistent with CHF. Has received IV Lasix 40 mg for 2 doses but still has rales on examination. Would restart IV Lasix 40 mg daily and follow I's and O's along with daily weights (weight 116 lbs this AM, at 118 lbs in 05/2018). Check BNP with AM labs.   3. CAD - s/p CABG in 12/2013 with LIMA-LAD, SVG-RI, and SVG-LCx.  - She reports worsening dyspnea in the setting of recent PNA but denies any recent chest pain. EKG shows no acute ischemic changes.  - continue ASA (has been on this along with anticoagulation per her Primary Cardiologist), BB, and statin therapy.   4. HTN - actually hypotensive with SBP in the 90's to low-100's following administration of AM medications. Continue to follow.   5.  HLD - remains on PTA Atorvastatin 40mg  daily.   6. Acute on Chronic Hypoxic Respiratory Failure - likely multifactorial in the setting of CHF and recent PNA diagnosis. WBC was initially elevated to 16.8, improved to 11.8 when rechecked this morning. Temperature elevated to 100.2 upon admission - has remained on Omnicef.  - per admitting team.   For questions or updates, please contact [place] Please consult [email or web address] for contact info under Cardiology/STEMI.  Signed, Erma Heritage, PA-C 11/08/2018, 12:15 PM Pager: [phone]  The patient was seen and examined, and I agree with the history, physical exam,  assessment and plan as documented above, with modifications as noted below. I have also personally reviewed all relevant documentation, old records, labs, and both radiographic and cardiovascular studies. I have also independently interpreted old and new ECG's.  Briefly, this is a 68 year old woman with a history of coronary disease and CABG, chronic diastolic heart failure, and permanent atrial fibrillation whom we are asked to evaluate for rapid atrial fibrillation and hypotension.  She is normally followed by Dr. Ellouise Newer but I am unable to access his notes in the EMR.  She was recently hospitalized for pneumonia and COPD exacerbation.  It appears she had been on long-acting diltiazem 120 mg daily prior to that hospitalization.  This was resumed at the time of discharge but her last dose was on the day of discharge and she did not receive any yesterday.  She presented to the ED yesterday complaining of worsening shortness of breath.  Chest x-ray showed bilateral pulmonary infiltrates/edema concerning for CHF.  ECG demonstrated rapid atrial fibrillation, 131 bpm.  She was initially started on IV diltiazem and transitioned to long-acting diltiazem this morning and Lopressor was increased from 12.5 mg twice daily to 25 mg twice daily.  She became hypotensive.  Heart rates at the time of my evaluation are in the 120-130 bpm range.  We will add IV Lopressor to be given as needed.   Renal function is normal.  Therefore, I will also give IV digoxin 0.25 mg which will not lower blood pressure. This can be repeated if she remains in rapid atrial fibrillation this evening.  Given her decompensated CHF, we will start IV Lasix 40 mg daily.   Kate Sable, MD, [facility]  11/08/2018 2:55 PM

## 2018-11-08 NOTE — Progress Notes (Signed)
PROGRESS NOTE  Mary [name] IRJ:[id number] DOB: 12/29/1950 DOA: 11/07/2018 PCP: Celene Squibb, MD   LOS: 0 days   Brief Narrative / Interim history: 68 y.o. female with medical history significant of COPD on home oxygen 2 L nasal cannula, chronic pain, permanent A. fib on anticoagulation with Eliquis, chronic diastolic CHF, hypertension, hyperlipidemia, who was recently admitted to the hospital with community-acquired pneumonia in the setting of recurrent falls and rib fractures, and discharged home on 1/12.  Patient was feeling well at the time of discharge, went home last night, everything seemed to be back to baseline, ate dinner went to bed.  When she woke up this morning, she felt all of a sudden short of breath as well as felt to have palpitations.  In the ED she was found to be in rapid A. fib as well as signs of fluid overload on chest x-ray.  Subjective: -Breathing better this morning, denies any chest pain but feels occasional palpitations.  No abdominal pain, no nausea or vomiting.  Assessment & Plan: Active Problems:   Diabetes mellitus (HCC)   COPD (chronic obstructive pulmonary disease) (HCC)   CAD (coronary artery disease)   Acute on chronic respiratory failure with hypoxia (HCC)   Acute on chronic diastolic CHF (congestive heart failure) (HCC)   Atrial fibrillation with RVR (HCC)   Principal Problem Acute on chronic hypoxic respiratory failure due to pulmonary edema likely in the setting of acute on chronic diastolic CHF as well as A. fib with RVR -Patient recently hospitalized and discharged the day prior to this admission, respiratory status was stable at that time.  Suspect she went into rapid A. fib at home which might have precipitated pulmonary edema -Persistent rapid A. fib overnight with rates into the 130s -Patient started on Cardizem infusion, continue, I have also resumed her home metoprolol at increased dose of 25 twice daily (she takes 12.5 twice daily at  home) -Continue home oral diltiazem as well, try to wean off of the drip -Continue IV Lasix for pulmonary edema -Continue Eliquis for anticoagulation -We will updated 2D echo, last one was done in March 2019 -Given poorly controlled rates and soft blood pressure, I have consulted cardiology, appreciate input.  Active Problems Type 2 diabetes mellitus -Continue sliding scale, fasting this morning 142  COPD -With exacerbation during recent admission, currently no wheezing, continue prednisone taper as well as cefdinir as per prior plans.  Hyperlipidemia -Continue atorvastatin  Coronary artery disease with history of CABG -No chest pain, continue aspirin and atorvastatin -Troponin unremarkable in the ED  Hypertension -Continue metoprolol, diltiazem as above  Recurrent falls/deconditioning with rib fractures -Recently diagnosed with pneumonia, continue home antibiotic, white count was 16 on admission, likely related to steroids.  White count 11 this morning -Of note, patient was recommended to go to SNF however she refused and chose to go home instead -Obtain PT again  Scheduled Meds: . apixaban  5 mg Oral BID  . aspirin EC  81 mg Oral Daily  . atorvastatin  40 mg Oral Daily  . cefdinir  300 mg Oral BID  . diltiazem  120 mg Oral Daily  . furosemide  40 mg Intravenous Q12H  . insulin aspart  0-9 Units Subcutaneous TID WC  . mouth rinse  15 mL Mouth Rinse BID  . metoprolol tartrate  25 mg Oral BID  . predniSONE  20 mg Oral Q breakfast   Continuous Infusions: PRN Meds:.acetaminophen, gabapentin, guaiFENesin-dextromethorphan  DVT prophylaxis: Eliquis Code Status:  DNR Family Communication: no family at bedside Disposition Plan: TBD, SNF when stable  Consultants:   None   Procedures:   2D echo: pending  Antimicrobials:  Cefdinir    Objective: Vitals:   11/08/18 0734 11/08/18 0800 11/08/18 1100 11/08/18 1115  BP:  116/73 (!) 98/53   Pulse: 98 (!) 112 (!) 121  (!) 125  Resp: 17 (!) 23 (!) 22 20  Temp: (!) 97.4 F (36.3 C)   99.1 F (37.3 C)  TempSrc: Oral   Oral  SpO2: 95% 94% 91% 91%  Weight:      Height:        Intake/Output Summary (Last 24 hours) at 11/08/2018 1129 Last data filed at 11/07/2018 1831 Gross per 24 hour  Intake 46.59 ml  Output -  Net 46.59 ml   Filed Weights   11/07/18 1139 11/07/18 1633 11/08/18 0500  Weight: 55 kg 53.7 kg 52.9 kg    Examination:  Constitutional: NAD, weak appearing Eyes: PERRL, lids and conjunctivae normal ENMT: Mucous membranes are moist.  Neck: normal, supple, Respiratory: Faint bibasilar crackles, no wheezing heard.  Tachypneic, increased respiratory effort Cardiovascular: Irregular irregular, tachycardic.  No peripheral edema Abdomen: Soft, NT, ND, bowel sounds positive Musculoskeletal: no clubbing / cyanosis. Skin: no rashes Neurologic: CN 2-12 grossly intact. Strength 5/5 in all 4.  Psychiatric: Normal judgment and insight. Alert and oriented x 3. Normal mood.    Data Reviewed: I have independently reviewed following labs and imaging studies  CBC: Recent Labs  Lab 11/03/18 1504 11/04/18 0552 11/07/18 1205 11/08/18 0426  WBC 14.7* 11.4* 16.8* 11.8*  NEUTROABS 11.8*  --  13.3*  --   HGB 9.0* 8.0* 9.3* 9.1*  HCT 29.0* 25.7* 31.2* 30.1*  MCV 92.7 90.8 93.4 93.8  PLT 305 258 332 638   Basic Metabolic Panel: Recent Labs  Lab 11/03/18 1504 11/04/18 0552 11/07/18 1205 11/08/18 0426  NA 135 135 137 139  K 3.8 3.9 3.9 3.7  CL 97* 99 98 99  CO2 27 28 30  32  GLUCOSE 91 103* 149* 129*  BUN 22 17 22 17   CREATININE 0.90 0.83 0.75 0.57  CALCIUM 8.8* 8.5* 10.0 9.2   GFR: Estimated Creatinine Clearance: 55.4 mL/min (by C-G formula based on SCr of 0.57 mg/dL). Liver Function Tests: Recent Labs  Lab 11/03/18 1504 11/07/18 1205 11/08/18 0426  AST 20 19 13*  ALT 15 16 13   ALKPHOS 57 56 48  BILITOT 0.5 0.4 0.5  PROT 7.5 7.7 6.2*  ALBUMIN 3.4* 3.6 2.9*   No results for  input(s): LIPASE, AMYLASE in the last 168 hours. No results for input(s): AMMONIA in the last 168 hours. Coagulation Profile: No results for input(s): INR, PROTIME in the last 168 hours. Cardiac Enzymes: Recent Labs  Lab 11/07/18 1205  TROPONINI <0.03   BNP (last 3 results) No results for input(s): PROBNP in the last 8760 hours. HbA1C: No results for input(s): HGBA1C in the last 72 hours. CBG: Recent Labs  Lab 11/06/18 1118 11/07/18 1643 11/07/18 2140 11/08/18 0733 11/08/18 1116  GLUCAP 361* 163* 101* 142* 254*   Lipid Profile: No results for input(s): CHOL, HDL, LDLCALC, TRIG, CHOLHDL, LDLDIRECT in the last 72 hours. Thyroid Function Tests: No results for input(s): TSH, T4TOTAL, FREET4, T3FREE, THYROIDAB in the last 72 hours. Anemia Panel: No results for input(s): VITAMINB12, FOLATE, FERRITIN, TIBC, IRON, RETICCTPCT in the last 72 hours. Urine analysis:    Component Value Date/Time   COLORURINE YELLOW 04/06/2018 1311  APPEARANCEUR CLEAR 04/06/2018 1311   LABSPEC 1.008 04/06/2018 1311   PHURINE 6.0 04/06/2018 1311   GLUCOSEU NEGATIVE 04/06/2018 1311   HGBUR NEGATIVE 04/06/2018 1311   BILIRUBINUR NEGATIVE 04/06/2018 1311   KETONESUR NEGATIVE 04/06/2018 1311   PROTEINUR NEGATIVE 04/06/2018 1311   UROBILINOGEN 0.2 02/17/2015 2040   NITRITE NEGATIVE 04/06/2018 1311   LEUKOCYTESUR NEGATIVE 04/06/2018 1311   Sepsis Labs: Invalid input(s): PROCALCITONIN, LACTICIDVEN  Recent Results (from the past 240 hour(s))  MRSA PCR Screening     Status: None   Collection Time: 11/07/18  4:02 PM  Result Value Ref Range Status   MRSA by PCR NEGATIVE NEGATIVE Final    Comment:        The GeneXpert MRSA Assay (FDA approved for NASAL specimens only), is one component of a comprehensive MRSA colonization surveillance program. It is not intended to diagnose MRSA infection nor to guide or monitor treatment for MRSA infections. Performed at [facility], [address]  [place], [place] [address]       Radiology Studies: Dg Chest Portable 1 View  Result Date: 11/07/2018 CLINICAL DATA:  Pneumonia. EXAM: PORTABLE CHEST 1 VIEW COMPARISON:  11/03/2018. FINDINGS: Prior CABG. Left atrial appendage clip in stable position. Cardiomegaly. Diffuse bilateral pulmonary infiltrates/edema, progressed from prior exam. Body suggest congestive heart failure. Bilateral pneumonia could also present this fashion. Pleural effusions have improved from prior exam. No pneumothorax. IMPRESSION: 1. Prior CABG. Cardiomegaly with progressive bilateral pulmonary infiltrates/edema. Findings suggest congestive heart failure. Bilateral pneumonia can not be excluded. 2.  Improved bilateral pleural effusions. Electronically Signed   By: Marcello Moores  Register   On: 11/07/2018 12:30     Marzetta Board, MD, PhD [facility]  Contact via  [email or web address]  [place] P: [phone]  F: [phone]

## 2018-11-09 ENCOUNTER — Inpatient Hospital Stay (HOSPITAL_COMMUNITY): Payer: Medicare Other

## 2018-11-09 DIAGNOSIS — I4891 Unspecified atrial fibrillation: Secondary | ICD-10-CM

## 2018-11-09 DIAGNOSIS — E1340 Other specified diabetes mellitus with diabetic neuropathy, unspecified: Secondary | ICD-10-CM | POA: Diagnosis not present

## 2018-11-09 DIAGNOSIS — I361 Nonrheumatic tricuspid (valve) insufficiency: Secondary | ICD-10-CM

## 2018-11-09 DIAGNOSIS — I5033 Acute on chronic diastolic (congestive) heart failure: Secondary | ICD-10-CM

## 2018-11-09 DIAGNOSIS — J449 Chronic obstructive pulmonary disease, unspecified: Secondary | ICD-10-CM | POA: Diagnosis not present

## 2018-11-09 DIAGNOSIS — E1140 Type 2 diabetes mellitus with diabetic neuropathy, unspecified: Secondary | ICD-10-CM | POA: Diagnosis not present

## 2018-11-09 DIAGNOSIS — I482 Chronic atrial fibrillation, unspecified: Secondary | ICD-10-CM | POA: Diagnosis not present

## 2018-11-09 LAB — BASIC METABOLIC PANEL
Anion gap: 8 (ref 5–15)
BUN: 19 mg/dL (ref 8–23)
CO2: 34 mmol/L — ABNORMAL HIGH (ref 22–32)
Calcium: 9.1 mg/dL (ref 8.9–10.3)
Chloride: 96 mmol/L — ABNORMAL LOW (ref 98–111)
Creatinine, Ser: 0.71 mg/dL (ref 0.44–1.00)
GFR calc Af Amer: >60 mL/min (ref >60)
Glucose, Bld: 167 mg/dL — ABNORMAL HIGH (ref 70–99)
Potassium: 4 mmol/L (ref 3.5–5.1)
Sodium: 138 mmol/L (ref 135–145)

## 2018-11-09 LAB — GLUCOSE, CAPILLARY
Glucose-Capillary: 161 mg/dL — ABNORMAL HIGH (ref 70–99)
Glucose-Capillary: 196 mg/dL — ABNORMAL HIGH (ref 70–99)
Glucose-Capillary: 197 mg/dL — ABNORMAL HIGH (ref 70–99)
Glucose-Capillary: 268 mg/dL — ABNORMAL HIGH (ref 70–99)
Glucose-Capillary: 422 mg/dL — ABNORMAL HIGH (ref 70–99)

## 2018-11-09 LAB — CBC
HCT: 31 % — ABNORMAL LOW (ref 36.0–46.0)
Hemoglobin: 9.7 g/dL — ABNORMAL LOW (ref 12.0–15.0)
MCH: 28.7 pg (ref 26.0–34.0)
MCHC: 31.3 g/dL (ref 30.0–36.0)
MCV: 91.7 fL (ref 80.0–100.0)
PLATELETS: 299 10*3/uL (ref 150–400)
RBC: 3.38 MIL/uL — ABNORMAL LOW (ref 3.87–5.11)
RDW: 15.9 % — ABNORMAL HIGH (ref 11.5–15.5)
WBC: 12 10*3/uL — ABNORMAL HIGH (ref 4.0–10.5)
nRBC: 0 % (ref 0.0–0.2)

## 2018-11-09 LAB — ECHOCARDIOGRAM COMPLETE
Height: 67 in
Weight: 1830.7 oz

## 2018-11-09 LAB — BRAIN NATRIURETIC PEPTIDE: B NATRIURETIC PEPTIDE 5: 249 pg/mL — AB (ref 0.0–100.0)

## 2018-11-09 MED ORDER — DILTIAZEM HCL ER COATED BEADS 180 MG PO CP24
180.0000 mg | ORAL_CAPSULE | Freq: Every day | ORAL | Status: DC
  Administered 2018-11-09 – 2018-11-10 (×2): 180 mg via ORAL
  Filled 2018-11-09 (×2): qty 1

## 2018-11-09 MED ORDER — INSULIN ASPART 100 UNIT/ML ~~LOC~~ SOLN
4.0000 [IU] | Freq: Three times a day (TID) | SUBCUTANEOUS | Status: DC
  Administered 2018-11-09 – 2018-11-10 (×3): 4 [IU] via SUBCUTANEOUS

## 2018-11-09 MED ORDER — INSULIN ASPART 100 UNIT/ML ~~LOC~~ SOLN
15.0000 [IU] | Freq: Once | SUBCUTANEOUS | Status: AC
  Administered 2018-11-09: 15 [IU] via SUBCUTANEOUS

## 2018-11-09 MED ORDER — ALPRAZOLAM 0.5 MG PO TABS
1.0000 mg | ORAL_TABLET | Freq: Three times a day (TID) | ORAL | Status: DC | PRN
  Administered 2018-11-09 – 2018-11-10 (×3): 1 mg via ORAL
  Filled 2018-11-09 (×4): qty 2

## 2018-11-09 NOTE — Evaluation (Signed)
Physical Therapy Evaluation Patient Details Name: Mary [name] MRN: [id number] DOB: [birth year] Today's Date: 11/09/2018   History of Present Illness  Mary [name] is a 68 y.o. female with medical history significant of COPD on home oxygen 2 L nasal cannula, chronic pain, permanent A. fib on anticoagulation with Eliquis, chronic diastolic CHF, hypertension, hyperlipidemia, who was recently admitted to the hospital with community-acquired pneumonia in the setting of recurrent falls and rib fractures, and discharged home on 1/12.  Patient was feeling well at the time of discharge, went home last night, everything seemed to be back to baseline, ate dinner went to bed.  When she woke up this morning, she felt all of a sudden short of breath as well as felt to have palpitations.  She denies any fever or chills, complains of a cough which has been all along since her pneumonia diagnosis but did not get worse, no abdominal pain, no nausea or vomiting.  She denies any leg swelling.    Clinical Impression  Patient functioning near baseline for functional mobility and gait, slightly labored cadence without loss of balance, on 4 LPM with O2 saturation between 91-94% during ambulation and up to 95-96% while seated in chair after therapy.  Patient will benefit from continued physical therapy in hospital and recommended venue below to increase strength, balance, endurance for safe ADLs and gait.    Follow Up Recommendations Home health PT;Supervision - Intermittent    Equipment Recommendations  None recommended by PT    Recommendations for Other Services       Precautions / Restrictions Precautions Precautions: Fall Restrictions Weight Bearing Restrictions: No      Mobility  Bed Mobility Overal bed mobility: Modified Independent             General bed mobility comments: increased time  Transfers Overall transfer level: Needs assistance Equipment used: Rolling walker (2 wheeled) Transfers:  Stand Pivot Transfers;Sit to/from Stand Sit to Stand: Supervision Stand pivot transfers: Supervision       General transfer comment: labored movement  Ambulation/Gait Ambulation/Gait assistance: Supervision Gait Distance (Feet): 100 Feet Assistive device: Rolling walker (2 wheeled) Gait Pattern/deviations: Decreased step length - right;Decreased step length - left;Decreased stride length Gait velocity: decreased   General Gait Details: slow slightly labored cadence without loss of balance on 4 LPM O2 with O2 saturation between 91-94 %, no loss of balance  Stairs            Wheelchair Mobility    Modified Rankin (Stroke Patients Only)       Balance Overall balance assessment: Needs assistance Sitting-balance support: Feet supported Sitting balance-Leahy Scale: Good     Standing balance support: During functional activity;Bilateral upper extremity supported Standing balance-Leahy Scale: Fair Standing balance comment: using RW                             Pertinent Vitals/Pain Pain Assessment: No/denies pain Pain Location: R rib fracture site    Home Living Family/patient expects to be discharged to:: Private residence Living Arrangements: Alone Available Help at Discharge: Friend(s);Neighbor;Family Type of Home: Apartment Home Access: Stairs to enter Entrance Stairs-Rails: Right;Left;Can reach both Entrance Stairs-Number of Steps: 3 Home Layout: One level Home Equipment: Walker - 2 wheels;Cane - single point;Bedside commode;Transport chair;Shower seat      Prior Function Level of Independence: Independent with assistive device(s)         Comments: community ambulator with SPC or RW,  limited driving     Hand Dominance   Dominant Hand: Right    Extremity/Trunk Assessment   Upper Extremity Assessment Upper Extremity Assessment: Generalized weakness    Lower Extremity Assessment Lower Extremity Assessment: Generalized weakness     Cervical / Trunk Assessment Cervical / Trunk Assessment: Normal  Communication   Communication: No difficulties  Cognition Arousal/Alertness: Awake/alert Behavior During Therapy: WFL for tasks assessed/performed Overall Cognitive Status: Within Functional Limits for tasks assessed                                        General Comments      Exercises     Assessment/Plan    PT Assessment Patient needs continued PT services  PT Problem List Decreased strength;Decreased activity tolerance;Decreased balance;Decreased mobility;Pain       PT Treatment Interventions Gait training;Stair training;Functional mobility training;Therapeutic activities;Patient/family education;Therapeutic exercise    PT Goals (Current goals can be found in the Care Plan section)  Acute Rehab PT Goals Patient Stated Goal: return home PT Goal Formulation: With patient Time For Goal Achievement: 11/16/18 Potential to Achieve Goals: Good    Frequency Min 3X/week   Barriers to discharge        Co-evaluation               AM-PAC PT "6 Clicks" Mobility  Outcome Measure Help needed turning from your back to your side while in a flat bed without using bedrails?: None Help needed moving from lying on your back to sitting on the side of a flat bed without using bedrails?: None Help needed moving to and from a bed to a chair (including a wheelchair)?: None Help needed standing up from a chair using your arms (e.g., wheelchair or bedside chair)?: A Little Help needed to walk in hospital room?: A Little Help needed climbing 3-5 steps with a railing? : A Little 6 Click Score: 21    End of Session Equipment Utilized During Treatment: Gait belt;Oxygen Activity Tolerance: Patient tolerated treatment well;Patient limited by fatigue Patient left: in chair;with call bell/phone within reach Nurse Communication: Mobility status PT Visit Diagnosis: Unsteadiness on feet (R26.81);Other  abnormalities of gait and mobility (R26.89);Muscle weakness (generalized) (M62.81)    Time: 1552-0802 PT Time Calculation (min) (ACUTE ONLY): 34 min   Charges:   PT Evaluation $PT Eval Moderate Complexity: 1 Mod PT Treatments $Gait Training: 23-37 mins        2:50 PM, 11/09/18 Lonell Grandchild, MPT Physical Therapist with [facility] [phone] office [phone] mobile phone

## 2018-11-09 NOTE — Progress Notes (Signed)
PROGRESS NOTE  Mary [name] HCW:[id number] DOB: Dec 03, 1950 DOA: 11/07/2018 PCP: Celene Squibb, MD  Brief History:  68 year old female with a history of permanent atrial fibrillation on apixaban, diastolic CHF, COPD, diabetes mellitus, hyperlipidemia, hypertension, COPD, coronary artery disease with CABG, and chronic respiratory failure on 2 L nasal cannula presented with shortness of breath and palpitations on the morning of 11/08/2018.  She was recently admitted to the hospital and treated for community-acquired pneumonia in the setting of recurrent falls and rib fractures.  She was discharged home in stable condition on 11/06/2018.  Patient was feeling well at the time of discharge, and everything seemed to be back to baseline when she ate dinner and went to bed.  Patient woke up on the morning of 11/08/2018 with acute onset of shortness of breath and palpitations. In the ED she was found to be in rapid A. fib as well as signs of fluid overload on chest x-ray.  Assessment/Plan: Acute on chronic hypoxic respiratory failuredue topulmonary edema -Remained stable on nasal cannula -Wean oxygen for saturation greater than 92% -pt is normally on 2L at home -currently stable on 3L  Acute on chronic diastolic CHF Rem-ains clinically fluid overloaded -Daily weights -Continue IV furosemide 40 mg IV daily -I's and O's incomplete  A. fib with RVR, permanent -Diltiazem CD increased to 100 mg daily -Appreciate cardiology follow-up -Persistent rapid A. fib overnight with rates into the 130s -Continue metoprolol tartrate 25 mg twice daily -Continue Eliquis for anticoagulation -Follow-up echocardiogram   Type 2 diabetes mellitus -Continue sliding scale  COPD -With exacerbation during recent admission, currently no wheezing, continue prednisone taper as well as cefdinir as per prior plans.  Hyperlipidemia -Continue atorvastatin  Coronary artery disease with history of CABG -No  chest pain, continue aspirin and atorvastatin -Troponin unremarkable in the ED -status post CABG in 2015 -Continue low-dose aspirin  Hypertension -Continue metoprolol, diltiazem as above  Recurrent falls/deconditioning with rib fractures -Recently diagnosed with pneumonia, continue home antibiotic, white count was 16 on admission, likely related to steroids.  White count 11 this morning -Of note, patient was recommended to go to SNF however she refused and chose to go home instead -Obtain PT again--SNF    Disposition Plan:   SNF 1/16 or 1/17  Family Communication:  No Family at bedside  Consultants: Cardiology  Code Status:   DNR  DVT Prophylaxis: Apixaban  Procedures: As Listed in Progress Note Above  Antibiotics: Cefdinir     Subjective: Patient states that she is breathing better, but still has dyspnea with mild exertion patient has no chest pain, abdominal pain, nausea, vomiting, diarrhea, hematochezia, melena.  Objective: Vitals:   11/09/18 0746 11/09/18 0800 11/09/18 0900 11/09/18 1000  BP:  127/66 108/65 128/71  Pulse:  86  94  Resp:  20 18 18   Temp: 98 F (36.7 C)     TempSrc: Oral     SpO2:  100%  99%  Weight:      Height:       No intake or output data in the 24 hours ending 11/09/18 1058 Weight change: -3.1 kg Exam:   General:  Pt is alert, follows commands appropriately, not in acute distress  HEENT: No icterus, No thrush, No neck mass, [place]/AT  Cardiovascular: IRRR, S1/S2, no rubs, no gallops  Respiratory: Bibasilar crackles, regular (no wheezing.  Abdomen: Soft/+BS, non tender, non distended, no guarding  Extremities: No edema, No lymphangitis, No petechiae,  No rashes, no synovitis   Data Reviewed: I have personally reviewed following labs and imaging studies Basic Metabolic Panel: Recent Labs  Lab 11/03/18 1504 11/04/18 0552 11/07/18 1205 11/08/18 0426 11/09/18 0421  NA 135 135 137 139 138  K 3.8 3.9 3.9 3.7 4.0  CL 97* 99  98 99 96*  CO2 27 28 30  32 34*  GLUCOSE 91 103* 149* 129* 167*  BUN 22 17 22 17 19   CREATININE 0.90 0.83 0.75 0.57 0.71  CALCIUM 8.8* 8.5* 10.0 9.2 9.1   Liver Function Tests: Recent Labs  Lab 11/03/18 1504 11/07/18 1205 11/08/18 0426  AST 20 19 13*  ALT 15 16 13   ALKPHOS 57 56 48  BILITOT 0.5 0.4 0.5  PROT 7.5 7.7 6.2*  ALBUMIN 3.4* 3.6 2.9*   No results for input(s): LIPASE, AMYLASE in the last 168 hours. No results for input(s): AMMONIA in the last 168 hours. Coagulation Profile: No results for input(s): INR, PROTIME in the last 168 hours. CBC: Recent Labs  Lab 11/03/18 1504 11/04/18 0552 11/07/18 1205 11/08/18 0426 11/09/18 0421  WBC 14.7* 11.4* 16.8* 11.8* 12.0*  NEUTROABS 11.8*  --  13.3*  --   --   HGB 9.0* 8.0* 9.3* 9.1* 9.7*  HCT 29.0* 25.7* 31.2* 30.1* 31.0*  MCV 92.7 90.8 93.4 93.8 91.7  PLT 305 258 332 304 299   Cardiac Enzymes: Recent Labs  Lab 11/07/18 1205  TROPONINI <0.03   BNP: Invalid input(s): POCBNP CBG: Recent Labs  Lab 11/08/18 1116 11/08/18 1632 11/08/18 2005 11/09/18 0005 11/09/18 0749  GLUCAP 254* 339* 325* 197* 161*   HbA1C: No results for input(s): HGBA1C in the last 72 hours. Urine analysis:    Component Value Date/Time   COLORURINE YELLOW 04/06/2018 1311   APPEARANCEUR CLEAR 04/06/2018 1311   LABSPEC 1.008 04/06/2018 1311   PHURINE 6.0 04/06/2018 1311   GLUCOSEU NEGATIVE 04/06/2018 1311   HGBUR NEGATIVE 04/06/2018 1311   BILIRUBINUR NEGATIVE 04/06/2018 1311   KETONESUR NEGATIVE 04/06/2018 1311   PROTEINUR NEGATIVE 04/06/2018 1311   UROBILINOGEN 0.2 02/17/2015 2040   NITRITE NEGATIVE 04/06/2018 1311   LEUKOCYTESUR NEGATIVE 04/06/2018 1311   Sepsis Labs: @LABRCNTIP (procalcitonin:4,lacticidven:4) ) Recent Results (from the past 240 hour(s))  MRSA PCR Screening     Status: None   Collection Time: 11/07/18  4:02 PM  Result Value Ref Range Status   MRSA by PCR NEGATIVE NEGATIVE Final    Comment:        The  GeneXpert MRSA Assay (FDA approved for NASAL specimens only), is one component of a comprehensive MRSA colonization surveillance program. It is not intended to diagnose MRSA infection nor to guide or monitor treatment for MRSA infections. Performed at [facility], [address], [place], [place] [address]      Scheduled Meds: . apixaban  5 mg Oral BID  . aspirin EC  81 mg Oral Daily  . atorvastatin  40 mg Oral Daily  . cefdinir  300 mg Oral BID  . diltiazem  180 mg Oral Daily  . furosemide  40 mg Intravenous Daily  . insulin aspart  0-9 Units Subcutaneous TID WC  . lidocaine  1 patch Transdermal Q24H  . mouth rinse  15 mL Mouth Rinse BID  . metoprolol tartrate  25 mg Oral BID  . predniSONE  20 mg Oral Q breakfast   Continuous Infusions:  Procedures/Studies: Dg Chest 2 View  Result Date: 11/03/2018 CLINICAL DATA:  Recent fall with right-sided chest pain, initial encounter  EXAM: CHEST - 2 VIEW COMPARISON:  04/06/2018 FINDINGS: Cardiac shadow is stable. Postsurgical changes are again noted. Patchy bibasilar infiltrates are seen. Hyperinflation is noted consistent with COPD. No acute bony abnormality is noted. IMPRESSION: Patchy bibasilar infiltrates are noted. Electronically Signed   By: Inez Catalina M.D.   On: 11/03/2018 14:03   Dg Ribs Unilateral Right  Result Date: 11/03/2018 CLINICAL DATA:  Recent fall with right-sided chest pain, initial encounter EXAM: RIGHT RIBS - 2 VIEW COMPARISON:  None. FINDINGS: Mildly displaced fracture of the right eighth through tenth ribs are noted laterally. No underlying pneumothorax is seen. No other focal abnormality is noted. IMPRESSION: Mildly displaced fractures of the right eighth through tenth ribs are seen without complicating factors. Electronically Signed   By: Inez Catalina M.D.   On: 11/03/2018 17:45   Dg Chest Portable 1 View  Result Date: 11/07/2018 CLINICAL DATA:  Pneumonia. EXAM: PORTABLE CHEST 1 VIEW COMPARISON:  11/03/2018.  FINDINGS: Prior CABG. Left atrial appendage clip in stable position. Cardiomegaly. Diffuse bilateral pulmonary infiltrates/edema, progressed from prior exam. Body suggest congestive heart failure. Bilateral pneumonia could also present this fashion. Pleural effusions have improved from prior exam. No pneumothorax. IMPRESSION: 1. Prior CABG. Cardiomegaly with progressive bilateral pulmonary infiltrates/edema. Findings suggest congestive heart failure. Bilateral pneumonia can not be excluded. 2.  Improved bilateral pleural effusions. Electronically Signed   By: Marcello Moores  Register   On: 11/07/2018 12:30    Orson Eva, DO  [facility] Pager [phone]  If 7PM-7AM, please contact night-coverage [email or web address] Password [email or web address] 11/09/2018, 10:58 AM   LOS: 1 day

## 2018-11-09 NOTE — Progress Notes (Signed)
*  PRELIMINARY RESULTS* Echocardiogram 2D Echocardiogram has been performed.  Leavy Cella 11/09/2018, 4:23 PM

## 2018-11-09 NOTE — Plan of Care (Signed)
  Problem: Acute Rehab PT Goals(only PT should resolve) Goal: Patient Will Transfer Sit To/From Stand Outcome: Progressing Flowsheets (Taken 11/09/2018 1451) Patient will transfer sit to/from stand: with modified independence Goal: Pt Will Transfer Bed To Chair/Chair To Bed Outcome: Progressing Flowsheets (Taken 11/09/2018 1451) Pt will Transfer Bed to Chair/Chair to Bed: with modified independence Goal: Pt Will Ambulate Outcome: Progressing Flowsheets (Taken 11/09/2018 1451) Pt will Ambulate: > 125 feet; with modified independence; with rolling walker   2:52 PM, 11/09/18 Lonell Grandchild, MPT Physical Therapist with [facility] [phone] office [phone] mobile phone

## 2018-11-09 NOTE — Progress Notes (Signed)
Progress Note  Patient Name: Mary [name] Date of Encounter: 11/09/2018  Primary Cardiologist: Dr. Shelva Majestic  Subjective   Feels weak, no chest pain or palpitations, intermittent cough.  Inpatient Medications    Scheduled Meds: . apixaban  5 mg Oral BID  . aspirin EC  81 mg Oral Daily  . atorvastatin  40 mg Oral Daily  . cefdinir  300 mg Oral BID  . diltiazem  120 mg Oral Daily  . furosemide  40 mg Intravenous Daily  . insulin aspart  0-9 Units Subcutaneous TID WC  . lidocaine  1 patch Transdermal Q24H  . mouth rinse  15 mL Mouth Rinse BID  . metoprolol tartrate  25 mg Oral BID  . predniSONE  20 mg Oral Q breakfast    PRN Meds: acetaminophen, gabapentin, guaiFENesin-dextromethorphan, metoprolol tartrate, traMADol   Vital Signs    Vitals:   11/09/18 0600 11/09/18 0700 11/09/18 0746 11/09/18 0800  BP: 109/63 107/65  127/66  Pulse: 82 95  86  Resp: 20 (!) 21  20  Temp:   98 F (36.7 C)   TempSrc:   Oral   SpO2: 92% 93%  100%  Weight:      Height:       No intake or output data in the 24 hours ending 11/09/18 0813 Filed Weights   11/07/18 1633 11/08/18 0500 11/09/18 0500  Weight: 53.7 kg 52.9 kg 51.9 kg    Telemetry    Atrial fibrillation personally reviewed.  ECG    Tracing from 11/07/2018 showed rapid atrial fibrillation with right bundle branch block.  Personally reviewed.  Physical Exam   GEN:  Chronically ill-appearing woman.  No acute distress.   Neck: No JVD. Cardiac:  Irregularly irregular, no gallop.  Respiratory: Nonlabored.  Coarse breath sounds with rhonchi. GI: Soft, nontender, bowel sounds present. MS: No edema; No deformity. Neuro:  Nonfocal. Psych: Alert and oriented x 3. Normal affect.  Labs    Chemistry Recent Labs  Lab 11/03/18 1504  11/07/18 1205 11/08/18 0426 11/09/18 0421  NA 135   < > 137 139 138  K 3.8   < > 3.9 3.7 4.0  CL 97*   < > 98 99 96*  CO2 27   < > 30 32 34*  GLUCOSE 91   < > 149* 129* 167*  BUN 22   <  > 22 17 19   CREATININE 0.90   < > 0.75 0.57 0.71  CALCIUM 8.8*   < > 10.0 9.2 9.1  PROT 7.5  --  7.7 6.2*  --   ALBUMIN 3.4*  --  3.6 2.9*  --   AST 20  --  19 13*  --   ALT 15  --  16 13  --   ALKPHOS 57  --  56 48  --   BILITOT 0.5  --  0.4 0.5  --   GFRNONAA >60   < > >60 >60 >60  GFRAA >60   < > >60 >60 >60  ANIONGAP 11   < > 9 8 8    < > = values in this interval not displayed.     Hematology Recent Labs  Lab 11/07/18 1205 11/08/18 0426 11/09/18 0421  WBC 16.8* 11.8* 12.0*  RBC 3.34* 3.21* 3.38*  HGB 9.3* 9.1* 9.7*  HCT 31.2* 30.1* 31.0*  MCV 93.4 93.8 91.7  MCH 27.8 28.3 28.7  MCHC 29.8* 30.2 31.3  RDW 16.7* 16.2* 15.9*  PLT 332 304  299    Cardiac Enzymes Recent Labs  Lab 11/07/18 1205  TROPONINI <0.03   No results for input(s): TROPIPOC in the last 168 hours.   BNP Recent Labs  Lab 11/09/18 0421  BNP 249.0*     Radiology    Dg Chest Portable 1 View  Result Date: 11/07/2018 CLINICAL DATA:  Pneumonia. EXAM: PORTABLE CHEST 1 VIEW COMPARISON:  11/03/2018. FINDINGS: Prior CABG. Left atrial appendage clip in stable position. Cardiomegaly. Diffuse bilateral pulmonary infiltrates/edema, progressed from prior exam. Body suggest congestive heart failure. Bilateral pneumonia could also present this fashion. Pleural effusions have improved from prior exam. No pneumothorax. IMPRESSION: 1. Prior CABG. Cardiomegaly with progressive bilateral pulmonary infiltrates/edema. Findings suggest congestive heart failure. Bilateral pneumonia can not be excluded. 2.  Improved bilateral pleural effusions. Electronically Signed   By: Marcello Moores  Register   On: 11/07/2018 12:30    Cardiac Studies   Echocardiogram 01/10/2018: Study Conclusions  - Left ventricle: The cavity size was normal. Wall thickness was   normal. Systolic function was normal. The estimated ejection   fraction was in the range of 50% to 55%. - Aortic valve: There was mild regurgitation. - Mitral valve: There was  mild regurgitation. - Left atrium: The atrium was severely dilated. - Right ventricle: The cavity size was mildly dilated. Systolic   function was mildly reduced. - Right atrium: The atrium was severely dilated. - Tricuspid valve: There was moderate regurgitation. - Pulmonary arteries: PA peak pressure: 67 mm Hg (S).  Patient Profile     68 y.o. female with past medical history of CAD (s/p CABG in 12/2013 with LIMA-LAD, SVG-RI, and SVG-LCx), persistent atrial fibrillation (LAA clip at time of CABG), chronic diastolic CHF, HTN, and HLD who is being seen for the evaluation of atrial fibrillation with RVR.  Assessment & Plan    1.  Permanent atrial fibrillation presenting with RVR in the setting of other comorbid illnesses and absence of rate control medication.  Heart rate coming under better control on combination of Cardizem CD and Lopressor.  She is on Eliquis for stroke prophylaxis.  2.  Acute on chronic diastolic heart failure, LVEF 50 to 55% by echocardiogram in March 2019.  Weight down approximately 4 pounds since admission.  Urine output not well recorded.  She is on IV Lasix.  3.  CAD status post CABG in 2015.  No active angina symptoms.  She is also on low-dose aspirin per Dr. Claiborne Billings.  Troponin I negative.  4.  Acute on chronic hypoxic respiratory failure with recent pneumonia, continues on antibiotics.  Increase Cardizem CD to 180 mg daily and continue current dose of Lopressor.  She remains on Eliquis for stroke prophylaxis.  Continue IV Lasix for now, renal function is stable.  Signed, Rozann Lesches, MD  11/09/2018, 8:13 AM

## 2018-11-09 NOTE — Care Management Note (Signed)
Case Management Note  Patient Details  Name: Mary [name] MRN: [id number] Date of Birth: 1951/09/01  Subjective/Objective:       Pt admitted with a-fib with RVR. Pt from home, [facility]  1/12 home with [facility] through [facility] after declining SNF. On admission pt felt going back home would not be a good idea and may be open to SNF, after PT evel recommended home with [facility] pt is okay with returning home with resumption of [place] services. She has home O2 pta           Action/Plan: DC home with resumption of [place] services. CM will follow and notify [facility] when pt ready for DC. Will need resumption orders.   Expected Discharge Date:        11/11/2018          Expected Discharge Plan:  [place]  In-House Referral:  NA  Discharge planning Services  CM Consult  Post Acute Care Choice:  Home Health, Resumption of Svcs/PTA Provider Choice offered to:  Patient  HH Arranged:  RN, PT, Nurse's Aide [place] Agency:  [place]  Status of Service:  Completed, signed off  If discussed at [place] of Stay Meetings, dates discussed:    Additional Comments:  Sherald Barge, RN 11/09/2018, 3:43 PM

## 2018-11-10 LAB — GLUCOSE, CAPILLARY
Glucose-Capillary: 166 mg/dL — ABNORMAL HIGH (ref 70–99)
Glucose-Capillary: 153 mg/dL — ABNORMAL HIGH (ref 70–99)

## 2018-11-10 LAB — BASIC METABOLIC PANEL
ANION GAP: 7 (ref 5–15)
BUN: 22 mg/dL (ref 8–23)
CO2: 34 mmol/L — ABNORMAL HIGH (ref 22–32)
CREATININE: 0.61 mg/dL (ref 0.44–1.00)
Calcium: 9 mg/dL (ref 8.9–10.3)
Chloride: 96 mmol/L — ABNORMAL LOW (ref 98–111)
GFR calc Af Amer: >60 mL/min (ref >60)
GFR calc non Af Amer: >60 mL/min (ref >60)
Glucose, Bld: 173 mg/dL — ABNORMAL HIGH (ref 70–99)
Potassium: 3.7 mmol/L (ref 3.5–5.1)
Sodium: 137 mmol/L (ref 135–145)

## 2018-11-10 MED ORDER — BUDESONIDE-FORMOTEROL FUMARATE 160-4.5 MCG/ACT IN AERO: 2.0000 | INHALATION_SPRAY | Freq: Two times a day (BID) | RESPIRATORY_TRACT | 0 refills | Status: AC

## 2018-11-10 MED ORDER — DILTIAZEM HCL ER COATED BEADS 240 MG PO CP24: 240.0000 mg | ORAL_CAPSULE | Freq: Every day | ORAL | 1 refills | Status: DC

## 2018-11-10 MED ORDER — METOPROLOL TARTRATE 25 MG PO TABS: 25.0000 mg | ORAL_TABLET | Freq: Two times a day (BID) | ORAL | 1 refills | Status: DC

## 2018-11-10 MED ORDER — FUROSEMIDE 40 MG PO TABS
40.0000 mg | ORAL_TABLET | Freq: Every day | ORAL | Status: DC
  Administered 2018-11-10: 40 mg via ORAL
  Filled 2018-11-10: qty 1

## 2018-11-10 MED ORDER — DILTIAZEM HCL ER COATED BEADS 240 MG PO CP24: 240.0000 mg | ORAL_CAPSULE | Freq: Every day | ORAL | Status: DC

## 2018-11-10 MED ORDER — ALBUTEROL SULFATE HFA 108 (90 BASE) MCG/ACT IN AERS: 2.0000 | INHALATION_SPRAY | Freq: Four times a day (QID) | RESPIRATORY_TRACT | 1 refills | Status: AC | PRN

## 2018-11-10 NOTE — Care Management (Addendum)
[facility] rep aware of DC today. DC'd from [facility] last summer. 2 admissions in 6 months. Will refer back to [facility].

## 2018-11-10 NOTE — Progress Notes (Signed)
Progress Note  Patient Name: Mary [name] Date of Encounter: 11/10/2018  Primary Cardiologist: Dr. Shelva Majestic  Subjective   States that she feels better.  Less short of breath.  No palpitations or chest pain.  Inpatient Medications    Scheduled Meds: . apixaban  5 mg Oral BID  . aspirin EC  81 mg Oral Daily  . atorvastatin  40 mg Oral Daily  . cefdinir  300 mg Oral BID  . diltiazem  180 mg Oral Daily  . furosemide  40 mg Intravenous Daily  . insulin aspart  0-9 Units Subcutaneous TID WC  . insulin aspart  4 Units Subcutaneous TID WC  . lidocaine  1 patch Transdermal Q24H  . mouth rinse  15 mL Mouth Rinse BID  . metoprolol tartrate  25 mg Oral BID  . predniSONE  20 mg Oral Q breakfast    PRN Meds: acetaminophen, ALPRAZolam, gabapentin, guaiFENesin-dextromethorphan, metoprolol tartrate, traMADol   Vital Signs    Vitals:   11/10/18 0500 11/10/18 0600 11/10/18 0700 11/10/18 0733  BP: 123/81 (!) 152/71 (!) 144/87   Pulse: 91 89 79   Resp: 18 17 15    Temp:    97.7 F (36.5 C)  TempSrc:    Oral  SpO2: 93% 99% 99%   Weight: 52.1 kg     Height:        Intake/Output Summary (Last 24 hours) at 11/10/2018 0823 Last data filed at 11/09/2018 2100 Gross per 24 hour  Intake 240 ml  Output -  Net 240 ml   Filed Weights   11/08/18 0500 11/09/18 0500 11/10/18 0500  Weight: 52.9 kg 51.9 kg 52.1 kg    Telemetry    Atrial fibrillation in the 90s.  Personally reviewed.  Physical Exam   GEN:  Chronically ill-appearing woman.  No acute distress.   Neck: No JVD. Cardiac:  Irregularly irregular, no gallop.  Respiratory: Nonlabored.  Coarse breath sounds with rhonchi, no wheezing. GI: Soft, nontender, bowel sounds present. MS: No edema; No deformity. Neuro:  Nonfocal. Psych: Alert and oriented x 3. Normal affect.  Labs    Chemistry Recent Labs  Lab 11/03/18 1504  11/07/18 1205 11/08/18 0426 11/09/18 0421 11/10/18 0459  NA 135   < > 137 139 138 137  K 3.8   <  > 3.9 3.7 4.0 3.7  CL 97*   < > 98 99 96* 96*  CO2 27   < > 30 32 34* 34*  GLUCOSE 91   < > 149* 129* 167* 173*  BUN 22   < > 22 17 19 22   CREATININE 0.90   < > 0.75 0.57 0.71 0.61  CALCIUM 8.8*   < > 10.0 9.2 9.1 9.0  PROT 7.5  --  7.7 6.2*  --   --   ALBUMIN 3.4*  --  3.6 2.9*  --   --   AST 20  --  19 13*  --   --   ALT 15  --  16 13  --   --   ALKPHOS 57  --  56 48  --   --   BILITOT 0.5  --  0.4 0.5  --   --   GFRNONAA >60   < > >60 >60 >60 >60  GFRAA >60   < > >60 >60 >60 >60  ANIONGAP 11   < > 9 8 8 7    < > = values in this interval not displayed.  Hematology Recent Labs  Lab 11/07/18 1205 11/08/18 0426 11/09/18 0421  WBC 16.8* 11.8* 12.0*  RBC 3.34* 3.21* 3.38*  HGB 9.3* 9.1* 9.7*  HCT 31.2* 30.1* 31.0*  MCV 93.4 93.8 91.7  MCH 27.8 28.3 28.7  MCHC 29.8* 30.2 31.3  RDW 16.7* 16.2* 15.9*  PLT 332 304 299    Cardiac Enzymes Recent Labs  Lab 11/07/18 1205  TROPONINI <0.03   No results for input(s): TROPIPOC in the last 168 hours.   BNP Recent Labs  Lab 11/09/18 0421  BNP 249.0*     Radiology    No results found.  Cardiac Studies   Echocardiogram 01/10/2018: Study Conclusions  - Left ventricle: The cavity size was normal. Wall thickness was normal. Systolic function was normal. The estimated ejection fraction was in the range of 50% to 55%. - Aortic valve: There was mild regurgitation. - Mitral valve: There was mild regurgitation. - Left atrium: The atrium was severely dilated. - Right ventricle: The cavity size was mildly dilated. Systolic function was mildly reduced. - Right atrium: The atrium was severely dilated. - Tricuspid valve: There was moderate regurgitation. - Pulmonary arteries: PA peak pressure: 67 mm Hg (S).  Patient Profile     68 y.o. female with past medical history of CAD (s/p CABG in 12/2013 with LIMA-LAD, SVG-RI, and SVG-LCx), persistent atrial fibrillation (LAA clip at time of CABG), chronic diastolic CHF, HTN,  and HLDwho is being seen for the evaluation of atrial fibrillation with RVR.  Assessment & Plan    1.  Permanent atrial fibrillation presenting with RVR in the setting of comorbid illness and absence of rate control medication.  She has improved with medication adjustments, now tolerating Cardizem CD and Lopressor, remains on Eliquis for stroke prophylaxis.  2.  Acute on chronic diastolic heart failure complicated by problem #1, LVEF 50 to 55% by echocardiogram March 2019.  She has been on IV Lasix with reasonable weight loss and improvement in symptoms, urine output not well recorded.  3.  CAD status post CABG in 2015.  No active angina symptoms.  She remains on aspirin along with Eliquis per Dr. Claiborne Billings.  Troponin I negative.  4.  Acute on chronic hypoxic respiratory failure with recent pneumonia, on home oxygen.  She remains on antibiotics.  [facility] will sign off.   Medication Recommendations: Continue Eliquis, low-dose aspirin, and Lipitor.  At this point would remain on Cardizem CD 180 mg daily and Lopressor 25 mg twice daily, these can be titrated as needed.  Would also convert to oral Lasix 40 mg daily. Other recommendations (labs, testing, etc): No additional cardiac testing is planned. Follow up as an outpatient: Follow-up in the [facility] office with Dr. Claiborne Billings or APP in the next 4 weeks.  Signed, Rozann Lesches, MD  11/10/2018, 8:23 AM

## 2018-11-10 NOTE — Discharge Summary (Signed)
Physician Discharge Summary  MAELEIGH BUSCHMAN WCH:[id number] DOB: August 18, 1951 DOA: 11/07/2018  PCP: Celene Squibb, MD  Admit date: 11/07/2018 Discharge date: 11/10/2018  Admitted From: Home Disposition:  Home   Recommendations for Outpatient Follow-up:  1. Follow up with PCP in 1-2 weeks 2. Please obtain BMP/CBC in one week   Home Health: YES Equipment/Devices:HHPT  Discharge Condition: Stable CODE STATUS: DNR Diet recommendation: Heart Healthy / Carb Modified    Brief/Interim Summary: 68 year old female with a history of permanent atrial fibrillation on apixaban, diastolic CHF, COPD, diabetes mellitus, hyperlipidemia, hypertension, COPD, coronary artery disease with CABG, and chronic respiratory failure on 2 L nasal cannula presented with shortness of breath and palpitations on the morning of 11/08/2018.  She was recently admitted to the hospital and treated for community-acquired pneumonia in the setting of recurrent falls and rib fractures.  She was discharged home in stable condition on 11/06/2018.  Patient was feeling well at the time of discharge, and everything seemed to be back to baseline when she ate dinner and went to bed.  Patient woke up on the morning of 11/08/2018 with acute onset of shortness of breath and palpitations.In the ED she was found to be in rapid A. fib as well as signs of fluid overload on chest x-ray.  The patient was started on IV furosemide.  Cardiology was consulted to assist with management.  Discharge Diagnoses:  Acute on chronic hypoxic respiratory failuredue topulmonary edema -Remained stable on nasal cannula -Wean oxygen for saturation greater than 92% -pt is normally on 2L at home -currently stable on 3L  Acute on chronic diastolic CHF -Daily weights--bed weights not accurate -Continue IV furosemide 40 mg IV daily>>> discharge home with furosemide 40 mg po daily -I's and O's incomplete  A. fib with RVR, permanent -Diltiazem CD increased to 240  mg daily after discussion with Dr. Domenic Polite; patient's heart rate increased to the 120s with ambulation prior to increasing to 240 mg daily -Appreciate cardiology follow-up -Continue metoprolol tartrate 25 mg twice daily -Continue Eliquis for anticoagulation -11/09/2018 echo--EF 50%, no WMA, mild TR, trivial AI/MR  Type 2 diabetes mellitus -Continue sliding scale -04/06/2018 hemoglobin A1c 6.7 -Restart glipizide and Januvia after discharge  COPD -With exacerbation during recent admission, currently no wheezing, continue prednisone taper as well as cefdinir as per prior plans.  Hyperlipidemia -Continue atorvastatin  Coronary artery disease with history of CABG -No chest pain, continue aspirin and atorvastatin -Troponin unremarkable in the ED -status post CABG in 2015 -Continue low-dose aspirin  Hypertension -Continue metoprolol, diltiazem as above  Recurrent falls/deconditioning with rib fractures -Recently diagnosed with pneumonia, continue home antibiotic, white countwas16 on admission, likely related to steroids.  -Of note, patient was recommended to go to SNF however she refused and chose to go home instead -Obtain PT again-->>> recommend home health  Lobar pneumonia -The patient was recently discharged from the hospital after stay from 11/03/2020 to 11/06/2018 -From the beginning of that hospitalization, the patient has completed 7 days of antibiotics  Discharge Instructions   Allergies as of 11/10/2018      Reactions   Adhesive [tape] Other (See Comments)   Tears skin off.    Latex Other (See Comments)   In tape, tears skin off.   Zocor [simvastatin - High Dose] Nausea And Vomiting      Medication List    STOP taking these medications   cefdinir 300 MG capsule Commonly known as:  OMNICEF   diltiazem 120 MG tablet Commonly known as:  CARDIZEM     TAKE these medications   acetaminophen 500 MG tablet Commonly known as:  TYLENOL Take 500 mg by mouth  every 6 (six) hours as needed for mild pain.   albuterol 108 (90 Base) MCG/ACT inhaler Commonly known as:  PROVENTIL HFA;VENTOLIN HFA Inhale 2 puffs into the lungs every 6 (six) hours as needed for shortness of breath.   apixaban 5 MG Tabs tablet Commonly known as:  ELIQUIS Take 1 tablet (5 mg total) by mouth 2 (two) times daily.   aspirin EC 81 MG tablet Take 81 mg by mouth daily.   atorvastatin 40 MG tablet Commonly known as:  LIPITOR TAKE ONE TABLET BY MOUTH DAILY.   budesonide-formoterol 160-4.5 MCG/ACT inhaler Commonly known as:  SYMBICORT Inhale 2 puffs into the lungs 2 (two) times daily. What changed:  See the new instructions.   diltiazem 240 MG 24 hr capsule Commonly known as:  CARDIZEM CD Take 1 capsule (240 mg total) by mouth daily. Start taking on:  November 11, 2018   furosemide 40 MG tablet Commonly known as:  LASIX TAKE ONE TABLET BY MOUTH ONCE DAILY.   FUSION PLUS PO Take 1 tablet by mouth daily.   gabapentin 100 MG capsule Commonly known as:  NEURONTIN Take 100 mg by mouth every 8 (eight) hours as needed (back pain).   glipiZIDE 2.5 MG 24 hr tablet Commonly known as:  GLUCOTROL XL Take 2.5 mg by mouth daily.   guaiFENesin-dextromethorphan 100-10 MG/5ML syrup Commonly known as:  ROBITUSSIN DM Take 5 mLs by mouth every 4 (four) hours as needed for cough.   JANUVIA 100 MG tablet Generic drug:  sitaGLIPtin TAKE ONE TABLET BY MOUTH DAILY.   metoprolol tartrate 25 MG tablet Commonly known as:  LOPRESSOR Take 1 tablet (25 mg total) by mouth 2 (two) times daily. What changed:    how much to take  additional instructions   potassium chloride SA 20 MEQ tablet Commonly known as:  K-DUR,KLOR-CON Take 1 tablet (20 mEq total) by mouth daily.   predniSONE 20 MG tablet Commonly known as:  DELTASONE Take 1 tablet (20 mg total) by mouth daily with breakfast.      Follow-up Information    Troy Sine, MD Follow up on 12/20/2018.   Specialty:   Cardiology Why:  Cardiology Follow-Up on 12/20/2018 at 2:00PM.  Contact information: [address] [place] [address] [phone]          Allergies  Allergen Reactions  . Adhesive [Tape] Other (See Comments)    Tears skin off.   . Latex Other (See Comments)    In tape, tears skin off.  . Zocor [Simvastatin - High Dose] Nausea And Vomiting    Consultations:  none   Procedures/Studies: Dg Chest 2 View  Result Date: 11/03/2018 CLINICAL DATA:  Recent fall with right-sided chest pain, initial encounter EXAM: CHEST - 2 VIEW COMPARISON:  04/06/2018 FINDINGS: Cardiac shadow is stable. Postsurgical changes are again noted. Patchy bibasilar infiltrates are seen. Hyperinflation is noted consistent with COPD. No acute bony abnormality is noted. IMPRESSION: Patchy bibasilar infiltrates are noted. Electronically Signed   By: Inez Catalina M.D.   On: 11/03/2018 14:03   Dg Ribs Unilateral Right  Result Date: 11/03/2018 CLINICAL DATA:  Recent fall with right-sided chest pain, initial encounter EXAM: RIGHT RIBS - 2 VIEW COMPARISON:  None. FINDINGS: Mildly displaced fracture of the right eighth through tenth ribs are noted laterally. No underlying pneumothorax is seen. No other  focal abnormality is noted. IMPRESSION: Mildly displaced fractures of the right eighth through tenth ribs are seen without complicating factors. Electronically Signed   By: Inez Catalina M.D.   On: 11/03/2018 17:45   Dg Chest Portable 1 View  Result Date: 11/07/2018 CLINICAL DATA:  Pneumonia. EXAM: PORTABLE CHEST 1 VIEW COMPARISON:  11/03/2018. FINDINGS: Prior CABG. Left atrial appendage clip in stable position. Cardiomegaly. Diffuse bilateral pulmonary infiltrates/edema, progressed from prior exam. Body suggest congestive heart failure. Bilateral pneumonia could also present this fashion. Pleural effusions have improved from prior exam. No pneumothorax. IMPRESSION: 1. Prior CABG. Cardiomegaly with progressive  bilateral pulmonary infiltrates/edema. Findings suggest congestive heart failure. Bilateral pneumonia can not be excluded. 2.  Improved bilateral pleural effusions. Electronically Signed   By: Marcello Moores  Register   On: 11/07/2018 12:30        Discharge Exam: Vitals:   11/10/18 1130 11/10/18 1145  BP:    Pulse: 94 (!) 112  Resp: (!) 23 17  Temp:    SpO2: 96% 96%   Vitals:   11/10/18 1110 11/10/18 1115 11/10/18 1130 11/10/18 1145  BP:      Pulse:  91 94 (!) 112  Resp:  19 (!) 23 17  Temp: 97.9 F (36.6 C)     TempSrc: Oral     SpO2:  95% 96% 96%  Weight:      Height:        General: Pt is alert, awake, not in acute distress Cardiovascular: IRRR, S1/S2 +, no rubs, no gallops Respiratory: Bibasilar rales but no wheezing. Abdominal: Soft, NT, ND, bowel sounds + Extremities: no edema, no cyanosis   The results of significant diagnostics from this hospitalization (including imaging, microbiology, ancillary and laboratory) are listed below for reference.    Significant Diagnostic Studies: Dg Chest 2 View  Result Date: 11/03/2018 CLINICAL DATA:  Recent fall with right-sided chest pain, initial encounter EXAM: CHEST - 2 VIEW COMPARISON:  04/06/2018 FINDINGS: Cardiac shadow is stable. Postsurgical changes are again noted. Patchy bibasilar infiltrates are seen. Hyperinflation is noted consistent with COPD. No acute bony abnormality is noted. IMPRESSION: Patchy bibasilar infiltrates are noted. Electronically Signed   By: Inez Catalina M.D.   On: 11/03/2018 14:03   Dg Ribs Unilateral Right  Result Date: 11/03/2018 CLINICAL DATA:  Recent fall with right-sided chest pain, initial encounter EXAM: RIGHT RIBS - 2 VIEW COMPARISON:  None. FINDINGS: Mildly displaced fracture of the right eighth through tenth ribs are noted laterally. No underlying pneumothorax is seen. No other focal abnormality is noted. IMPRESSION: Mildly displaced fractures of the right eighth through tenth ribs are seen without  complicating factors. Electronically Signed   By: Inez Catalina M.D.   On: 11/03/2018 17:45   Dg Chest Portable 1 View  Result Date: 11/07/2018 CLINICAL DATA:  Pneumonia. EXAM: PORTABLE CHEST 1 VIEW COMPARISON:  11/03/2018. FINDINGS: Prior CABG. Left atrial appendage clip in stable position. Cardiomegaly. Diffuse bilateral pulmonary infiltrates/edema, progressed from prior exam. Body suggest congestive heart failure. Bilateral pneumonia could also present this fashion. Pleural effusions have improved from prior exam. No pneumothorax. IMPRESSION: 1. Prior CABG. Cardiomegaly with progressive bilateral pulmonary infiltrates/edema. Findings suggest congestive heart failure. Bilateral pneumonia can not be excluded. 2.  Improved bilateral pleural effusions. Electronically Signed   By: Marcello Moores  Register   On: 11/07/2018 12:30     Microbiology: Recent Results (from the past 240 hour(s))  MRSA PCR Screening     Status: None   Collection Time: 11/07/18  4:02 PM  Result Value Ref Range Status   MRSA by PCR NEGATIVE NEGATIVE Final    Comment:        The GeneXpert MRSA Assay (FDA approved for NASAL specimens only), is one component of a comprehensive MRSA colonization surveillance program. It is not intended to diagnose MRSA infection nor to guide or monitor treatment for MRSA infections. Performed at [facility], [address], [place], [place] [address]      Labs: Basic Metabolic Panel: Recent Labs  Lab 11/04/18 0552 11/07/18 1205 11/08/18 0426 11/09/18 0421 11/10/18 0459  NA 135 137 139 138 137  K 3.9 3.9 3.7 4.0 3.7  CL 99 98 99 96* 96*  CO2 28 30 32 34* 34*  GLUCOSE 103* 149* 129* 167* 173*  BUN 17 22 17 19 22   CREATININE 0.83 0.75 0.57 0.71 0.61  CALCIUM 8.5* 10.0 9.2 9.1 9.0   Liver Function Tests: Recent Labs  Lab 11/03/18 1504 11/07/18 1205 11/08/18 0426  AST 20 19 13*  ALT 15 16 13   ALKPHOS 57 56 48  BILITOT 0.5 0.4 0.5  PROT 7.5 7.7 6.2*  ALBUMIN 3.4* 3.6 2.9*    No results for input(s): LIPASE, AMYLASE in the last 168 hours. No results for input(s): AMMONIA in the last 168 hours. CBC: Recent Labs  Lab 11/03/18 1504 11/04/18 0552 11/07/18 1205 11/08/18 0426 11/09/18 0421  WBC 14.7* 11.4* 16.8* 11.8* 12.0*  NEUTROABS 11.8*  --  13.3*  --   --   HGB 9.0* 8.0* 9.3* 9.1* 9.7*  HCT 29.0* 25.7* 31.2* 30.1* 31.0*  MCV 92.7 90.8 93.4 93.8 91.7  PLT 305 258 332 304 299   Cardiac Enzymes: Recent Labs  Lab 11/07/18 1205  TROPONINI <0.03   BNP: Invalid input(s): POCBNP CBG: Recent Labs  Lab 11/09/18 1123 11/09/18 1633 11/09/18 2211 11/10/18 0735 11/10/18 1109  GLUCAP 422* 196* 268* 166* 153*    Time coordinating discharge:  36 minutes  Signed:  Orson Eva, DO [facility] Pager: [phone] 11/10/2018, 12:54 PM

## 2018-11-10 NOTE — Progress Notes (Signed)
Physical Therapy Treatment Patient Details Name: Mary [name] MRN: [id number] DOB: September 23, 1951 Today's Date: 11/10/2018    History of Present Illness Mary [name] is a 68 y.o. female with medical history significant of COPD on home oxygen 2 L nasal cannula, chronic pain, permanent A. fib on anticoagulation with Eliquis, chronic diastolic CHF, hypertension, hyperlipidemia, who was recently admitted to the hospital with community-acquired pneumonia in the setting of recurrent falls and rib fractures, and discharged home on 1/12.  Patient was feeling well at the time of discharge, went home last night, everything seemed to be back to baseline, ate dinner went to bed.  When she woke up this morning, she felt all of a sudden short of breath as well as felt to have palpitations.  She denies any fever or chills, complains of a cough which has been all along since her pneumonia diagnosis but did not get worse, no abdominal pain, no nausea or vomiting.  She denies any leg swelling.    PT Comments    Patient tolerated treatment well today. Patient able to ambulate 100 feet using RW on 3 LPM supplemental oxygen with decreased cadence and no loss of balance. Patients's heart rate monitored during ambulation training ranging between 98-124 bpm and O2 saturation monitored ranging between 92-96%. Patient reports mild fatigue with gait training that is "normal" and the same at home. Patient able to perform bil lower extremity exercises without fatigue. Patient will benefit from continued physical therapy in hospital and recommendations at discharge listed below.   Follow Up Recommendations  Home health PT;Supervision - Intermittent     Equipment Recommendations  None recommended by PT    Recommendations for Other Services       Precautions / Restrictions Precautions Precautions: Fall Restrictions Weight Bearing Restrictions: No    Mobility  Bed Mobility Overal bed mobility: Modified Independent              General bed mobility comments: increased time, bed rail use  Transfers Overall transfer level: Needs assistance Equipment used: Rolling walker (2 wheeled) Transfers: Sit to/from [facility] Sit to Stand: Supervision Stand pivot transfers: Supervision       General transfer comment: increased time, labored movement  Ambulation/Gait Ambulation/Gait assistance: Supervision Gait Distance (Feet): 100 Feet Assistive device: Rolling walker (2 wheeled) Gait Pattern/deviations: Decreased step length - right;Decreased step length - left;Decreased stride length Gait velocity: decreased   General Gait Details: flat foot posture, minimally labored cadence, no loss of balance, on 3 LPM with O2 saturation between 92-96% and heartrate 98-124 bpm   Stairs             Wheelchair Mobility    Modified Rankin (Stroke Patients Only)       Balance Overall balance assessment: Needs assistance Sitting-balance support: Feet supported;No upper extremity supported Sitting balance-Leahy Scale: Good     Standing balance support: During functional activity;Bilateral upper extremity supported Standing balance-Leahy Scale: Fair Standing balance comment: with RW                            Cognition Arousal/Alertness: Awake/alert Behavior During Therapy: WFL for tasks assessed/performed Overall Cognitive Status: Within Functional Limits for tasks assessed                                        Exercises General Exercises - Lower  Extremity Long Arc Quad: Seated;AROM;Strengthening;Both;10 reps Hip Flexion/Marching: Seated;AROM;Strengthening;Both;10 reps Toe Raises: Seated;AROM;Strengthening;Both;10 reps Heel Raises: Seated;AROM;Strengthening;Both;10 reps    General Comments        Pertinent Vitals/Pain Pain Assessment: No/denies pain    Home Living                      Prior Function            PT Goals (current  goals can now be found in the care plan section) Acute Rehab PT Goals Patient Stated Goal: return home PT Goal Formulation: With patient Time For Goal Achievement: 11/16/18 Potential to Achieve Goals: Good Progress towards PT goals: Progressing toward goals    Frequency    Min 2X/week      PT Plan Current plan remains appropriate    Co-evaluation              AM-PAC PT "6 Clicks" Mobility   Outcome Measure  Help needed turning from your back to your side while in a flat bed without using bedrails?: A Little(use of bed rail) Help needed moving from lying on your back to sitting on the side of a flat bed without using bedrails?: A Little(use of bed rail) Help needed moving to and from a bed to a chair (including a wheelchair)?: A Little Help needed standing up from a chair using your arms (e.g., wheelchair or bedside chair)?: A Little Help needed to walk in hospital room?: A Little Help needed climbing 3-5 steps with a railing? : A Little 6 Click Score: 18    End of Session Equipment Utilized During Treatment: Gait belt;Oxygen(3 LPM) Activity Tolerance: Patient tolerated treatment well;Patient limited by fatigue Patient left: in chair;with call bell/phone within reach Nurse Communication: Mobility status PT Visit Diagnosis: Unsteadiness on feet (R26.81);Other abnormalities of gait and mobility (R26.89);Muscle weakness (generalized) (M62.81)     Time: 1020-1047 PT Time Calculation (min) (ACUTE ONLY): 27 min  Charges:  $Gait Training: 8-22 mins $Therapeutic Exercise: 8-22 mins                     11:01 AM, 11/10/18 Talbot Grumbling, PT, DPT Physical Therapist with [facility] [phone] mobile phone

## 2018-11-11 DIAGNOSIS — I4821 Permanent atrial fibrillation: Secondary | ICD-10-CM | POA: Diagnosis not present

## 2018-11-11 DIAGNOSIS — Z7901 Long term (current) use of anticoagulants: Secondary | ICD-10-CM | POA: Diagnosis not present

## 2018-11-11 DIAGNOSIS — I110 Hypertensive heart disease with heart failure: Secondary | ICD-10-CM | POA: Diagnosis not present

## 2018-11-11 DIAGNOSIS — I2510 Atherosclerotic heart disease of native coronary artery without angina pectoris: Secondary | ICD-10-CM | POA: Diagnosis not present

## 2018-11-11 DIAGNOSIS — Z79891 Long term (current) use of opiate analgesic: Secondary | ICD-10-CM | POA: Diagnosis not present

## 2018-11-11 DIAGNOSIS — J181 Lobar pneumonia, unspecified organism: Secondary | ICD-10-CM | POA: Diagnosis not present

## 2018-11-11 DIAGNOSIS — E119 Type 2 diabetes mellitus without complications: Secondary | ICD-10-CM | POA: Diagnosis not present

## 2018-11-11 DIAGNOSIS — Z7982 Long term (current) use of aspirin: Secondary | ICD-10-CM | POA: Diagnosis not present

## 2018-11-11 DIAGNOSIS — Z7951 Long term (current) use of inhaled steroids: Secondary | ICD-10-CM | POA: Diagnosis not present

## 2018-11-11 DIAGNOSIS — Z7984 Long term (current) use of oral hypoglycemic drugs: Secondary | ICD-10-CM | POA: Diagnosis not present

## 2018-11-11 DIAGNOSIS — Z9981 Dependence on supplemental oxygen: Secondary | ICD-10-CM | POA: Diagnosis not present

## 2018-11-11 DIAGNOSIS — I5033 Acute on chronic diastolic (congestive) heart failure: Secondary | ICD-10-CM | POA: Diagnosis not present

## 2018-11-11 DIAGNOSIS — J9621 Acute and chronic respiratory failure with hypoxia: Secondary | ICD-10-CM | POA: Diagnosis not present

## 2018-11-11 DIAGNOSIS — E785 Hyperlipidemia, unspecified: Secondary | ICD-10-CM | POA: Diagnosis not present

## 2018-11-11 DIAGNOSIS — J440 Chronic obstructive pulmonary disease with acute lower respiratory infection: Secondary | ICD-10-CM | POA: Diagnosis not present

## 2018-11-14 DIAGNOSIS — I5033 Acute on chronic diastolic (congestive) heart failure: Secondary | ICD-10-CM | POA: Diagnosis not present

## 2018-11-14 DIAGNOSIS — Z9981 Dependence on supplemental oxygen: Secondary | ICD-10-CM | POA: Diagnosis not present

## 2018-11-14 DIAGNOSIS — Z79891 Long term (current) use of opiate analgesic: Secondary | ICD-10-CM | POA: Diagnosis not present

## 2018-11-14 DIAGNOSIS — Z7982 Long term (current) use of aspirin: Secondary | ICD-10-CM | POA: Diagnosis not present

## 2018-11-14 DIAGNOSIS — J9621 Acute and chronic respiratory failure with hypoxia: Secondary | ICD-10-CM | POA: Diagnosis not present

## 2018-11-14 DIAGNOSIS — I2510 Atherosclerotic heart disease of native coronary artery without angina pectoris: Secondary | ICD-10-CM | POA: Diagnosis not present

## 2018-11-14 DIAGNOSIS — E119 Type 2 diabetes mellitus without complications: Secondary | ICD-10-CM | POA: Diagnosis not present

## 2018-11-14 DIAGNOSIS — Z7951 Long term (current) use of inhaled steroids: Secondary | ICD-10-CM | POA: Diagnosis not present

## 2018-11-14 DIAGNOSIS — Z7901 Long term (current) use of anticoagulants: Secondary | ICD-10-CM | POA: Diagnosis not present

## 2018-11-14 DIAGNOSIS — J440 Chronic obstructive pulmonary disease with acute lower respiratory infection: Secondary | ICD-10-CM | POA: Diagnosis not present

## 2018-11-14 DIAGNOSIS — Z7984 Long term (current) use of oral hypoglycemic drugs: Secondary | ICD-10-CM | POA: Diagnosis not present

## 2018-11-14 DIAGNOSIS — I4821 Permanent atrial fibrillation: Secondary | ICD-10-CM | POA: Diagnosis not present

## 2018-11-14 DIAGNOSIS — J181 Lobar pneumonia, unspecified organism: Secondary | ICD-10-CM | POA: Diagnosis not present

## 2018-11-14 DIAGNOSIS — E785 Hyperlipidemia, unspecified: Secondary | ICD-10-CM | POA: Diagnosis not present

## 2018-11-14 DIAGNOSIS — I110 Hypertensive heart disease with heart failure: Secondary | ICD-10-CM | POA: Diagnosis not present

## 2018-11-15 ENCOUNTER — Other Ambulatory Visit: Payer: Self-pay

## 2018-11-15 NOTE — Patient Outreach (Signed)
[place] [facility]) Care Management  11/15/2018  Mary [name] August 26, 1951 [id number]   Unsuccessful outreach attempt. Phone rang several times without option to leave a voice message.    PLAN Will send unsuccessful outreach letter. Will attempt to reach member within 3-4 business days.   [place] [phone]

## 2018-11-16 DIAGNOSIS — I2510 Atherosclerotic heart disease of native coronary artery without angina pectoris: Secondary | ICD-10-CM | POA: Diagnosis not present

## 2018-11-16 DIAGNOSIS — Z79891 Long term (current) use of opiate analgesic: Secondary | ICD-10-CM | POA: Diagnosis not present

## 2018-11-16 DIAGNOSIS — I110 Hypertensive heart disease with heart failure: Secondary | ICD-10-CM | POA: Diagnosis not present

## 2018-11-16 DIAGNOSIS — Z7901 Long term (current) use of anticoagulants: Secondary | ICD-10-CM | POA: Diagnosis not present

## 2018-11-16 DIAGNOSIS — E785 Hyperlipidemia, unspecified: Secondary | ICD-10-CM | POA: Diagnosis not present

## 2018-11-16 DIAGNOSIS — Z7951 Long term (current) use of inhaled steroids: Secondary | ICD-10-CM | POA: Diagnosis not present

## 2018-11-16 DIAGNOSIS — I4821 Permanent atrial fibrillation: Secondary | ICD-10-CM | POA: Diagnosis not present

## 2018-11-16 DIAGNOSIS — Z7984 Long term (current) use of oral hypoglycemic drugs: Secondary | ICD-10-CM | POA: Diagnosis not present

## 2018-11-16 DIAGNOSIS — Z7982 Long term (current) use of aspirin: Secondary | ICD-10-CM | POA: Diagnosis not present

## 2018-11-16 DIAGNOSIS — I5033 Acute on chronic diastolic (congestive) heart failure: Secondary | ICD-10-CM | POA: Diagnosis not present

## 2018-11-16 DIAGNOSIS — J181 Lobar pneumonia, unspecified organism: Secondary | ICD-10-CM | POA: Diagnosis not present

## 2018-11-16 DIAGNOSIS — J440 Chronic obstructive pulmonary disease with acute lower respiratory infection: Secondary | ICD-10-CM | POA: Diagnosis not present

## 2018-11-16 DIAGNOSIS — J9621 Acute and chronic respiratory failure with hypoxia: Secondary | ICD-10-CM | POA: Diagnosis not present

## 2018-11-16 DIAGNOSIS — Z9981 Dependence on supplemental oxygen: Secondary | ICD-10-CM | POA: Diagnosis not present

## 2018-11-16 DIAGNOSIS — E119 Type 2 diabetes mellitus without complications: Secondary | ICD-10-CM | POA: Diagnosis not present

## 2018-11-18 ENCOUNTER — Other Ambulatory Visit: Payer: Self-pay

## 2018-11-18 DIAGNOSIS — J9610 Chronic respiratory failure, unspecified whether with hypoxia or hypercapnia: Secondary | ICD-10-CM | POA: Diagnosis not present

## 2018-11-18 DIAGNOSIS — I480 Paroxysmal atrial fibrillation: Secondary | ICD-10-CM | POA: Diagnosis not present

## 2018-11-18 DIAGNOSIS — I6529 Occlusion and stenosis of unspecified carotid artery: Secondary | ICD-10-CM | POA: Diagnosis not present

## 2018-11-18 DIAGNOSIS — I5032 Chronic diastolic (congestive) heart failure: Secondary | ICD-10-CM | POA: Diagnosis not present

## 2018-11-18 NOTE — Patient Outreach (Signed)
[place] [facility]) Care Management  11/18/2018  Mary [name] 07-23-1951 [id number]   Unsuccessful outreach attempt. Left HIPAA compliant voice message requesting a return call.   PLAN Will follow up in 3-4 business days.   [place] [phone]

## 2018-11-21 DIAGNOSIS — E119 Type 2 diabetes mellitus without complications: Secondary | ICD-10-CM | POA: Diagnosis not present

## 2018-11-21 DIAGNOSIS — J440 Chronic obstructive pulmonary disease with acute lower respiratory infection: Secondary | ICD-10-CM | POA: Diagnosis not present

## 2018-11-21 DIAGNOSIS — Z9981 Dependence on supplemental oxygen: Secondary | ICD-10-CM | POA: Diagnosis not present

## 2018-11-21 DIAGNOSIS — Z79891 Long term (current) use of opiate analgesic: Secondary | ICD-10-CM | POA: Diagnosis not present

## 2018-11-21 DIAGNOSIS — Z7901 Long term (current) use of anticoagulants: Secondary | ICD-10-CM | POA: Diagnosis not present

## 2018-11-21 DIAGNOSIS — I4821 Permanent atrial fibrillation: Secondary | ICD-10-CM | POA: Diagnosis not present

## 2018-11-21 DIAGNOSIS — E785 Hyperlipidemia, unspecified: Secondary | ICD-10-CM | POA: Diagnosis not present

## 2018-11-21 DIAGNOSIS — Z7982 Long term (current) use of aspirin: Secondary | ICD-10-CM | POA: Diagnosis not present

## 2018-11-21 DIAGNOSIS — J9621 Acute and chronic respiratory failure with hypoxia: Secondary | ICD-10-CM | POA: Diagnosis not present

## 2018-11-21 DIAGNOSIS — Z7951 Long term (current) use of inhaled steroids: Secondary | ICD-10-CM | POA: Diagnosis not present

## 2018-11-21 DIAGNOSIS — I5033 Acute on chronic diastolic (congestive) heart failure: Secondary | ICD-10-CM | POA: Diagnosis not present

## 2018-11-21 DIAGNOSIS — Z7984 Long term (current) use of oral hypoglycemic drugs: Secondary | ICD-10-CM | POA: Diagnosis not present

## 2018-11-21 DIAGNOSIS — J181 Lobar pneumonia, unspecified organism: Secondary | ICD-10-CM | POA: Diagnosis not present

## 2018-11-21 DIAGNOSIS — I2510 Atherosclerotic heart disease of native coronary artery without angina pectoris: Secondary | ICD-10-CM | POA: Diagnosis not present

## 2018-11-21 DIAGNOSIS — I110 Hypertensive heart disease with heart failure: Secondary | ICD-10-CM | POA: Diagnosis not present

## 2018-11-22 DIAGNOSIS — I5033 Acute on chronic diastolic (congestive) heart failure: Secondary | ICD-10-CM | POA: Diagnosis not present

## 2018-11-22 DIAGNOSIS — J9621 Acute and chronic respiratory failure with hypoxia: Secondary | ICD-10-CM | POA: Diagnosis not present

## 2018-11-22 DIAGNOSIS — I2510 Atherosclerotic heart disease of native coronary artery without angina pectoris: Secondary | ICD-10-CM | POA: Diagnosis not present

## 2018-11-22 DIAGNOSIS — Z7951 Long term (current) use of inhaled steroids: Secondary | ICD-10-CM | POA: Diagnosis not present

## 2018-11-22 DIAGNOSIS — J181 Lobar pneumonia, unspecified organism: Secondary | ICD-10-CM | POA: Diagnosis not present

## 2018-11-22 DIAGNOSIS — Z7982 Long term (current) use of aspirin: Secondary | ICD-10-CM | POA: Diagnosis not present

## 2018-11-22 DIAGNOSIS — I4821 Permanent atrial fibrillation: Secondary | ICD-10-CM | POA: Diagnosis not present

## 2018-11-22 DIAGNOSIS — Z9981 Dependence on supplemental oxygen: Secondary | ICD-10-CM | POA: Diagnosis not present

## 2018-11-22 DIAGNOSIS — E119 Type 2 diabetes mellitus without complications: Secondary | ICD-10-CM | POA: Diagnosis not present

## 2018-11-22 DIAGNOSIS — Z7984 Long term (current) use of oral hypoglycemic drugs: Secondary | ICD-10-CM | POA: Diagnosis not present

## 2018-11-22 DIAGNOSIS — I110 Hypertensive heart disease with heart failure: Secondary | ICD-10-CM | POA: Diagnosis not present

## 2018-11-22 DIAGNOSIS — E785 Hyperlipidemia, unspecified: Secondary | ICD-10-CM | POA: Diagnosis not present

## 2018-11-22 DIAGNOSIS — Z7901 Long term (current) use of anticoagulants: Secondary | ICD-10-CM | POA: Diagnosis not present

## 2018-11-22 DIAGNOSIS — J440 Chronic obstructive pulmonary disease with acute lower respiratory infection: Secondary | ICD-10-CM | POA: Diagnosis not present

## 2018-11-22 DIAGNOSIS — Z79891 Long term (current) use of opiate analgesic: Secondary | ICD-10-CM | POA: Diagnosis not present

## 2018-11-23 ENCOUNTER — Other Ambulatory Visit: Payer: Self-pay

## 2018-11-23 NOTE — Patient Outreach (Signed)
[place] [facility]) Care Management  11/23/2018  Mary [name] Mar 21, 1951 [id number]   Unsuccessful outreach attempt. No answer after multiple rings (over 2 min) or option to leave a voice message.  PLAN Case closure pending.   [place] [phone]

## 2018-11-24 ENCOUNTER — Emergency Department (HOSPITAL_COMMUNITY)
Admission: EM | Admit: 2018-11-24 | Discharge: 2018-11-24 | Disposition: A | Discharge status: Home / Self Care | Payer: Medicare Other | Attending: Emergency Medicine | Admitting: Emergency Medicine

## 2018-11-24 ENCOUNTER — Emergency Department (HOSPITAL_COMMUNITY): Payer: Medicare Other

## 2018-11-24 ENCOUNTER — Encounter (HOSPITAL_COMMUNITY): Payer: Self-pay | Admitting: Emergency Medicine

## 2018-11-24 ENCOUNTER — Other Ambulatory Visit: Payer: Self-pay

## 2018-11-24 DIAGNOSIS — I110 Hypertensive heart disease with heart failure: Secondary | ICD-10-CM | POA: Insufficient documentation

## 2018-11-24 DIAGNOSIS — E1140 Type 2 diabetes mellitus with diabetic neuropathy, unspecified: Secondary | ICD-10-CM | POA: Insufficient documentation

## 2018-11-24 DIAGNOSIS — I5033 Acute on chronic diastolic (congestive) heart failure: Secondary | ICD-10-CM | POA: Diagnosis not present

## 2018-11-24 DIAGNOSIS — Z7982 Long term (current) use of aspirin: Secondary | ICD-10-CM | POA: Insufficient documentation

## 2018-11-24 DIAGNOSIS — Z7984 Long term (current) use of oral hypoglycemic drugs: Secondary | ICD-10-CM | POA: Insufficient documentation

## 2018-11-24 DIAGNOSIS — E875 Hyperkalemia: Secondary | ICD-10-CM | POA: Insufficient documentation

## 2018-11-24 DIAGNOSIS — J440 Chronic obstructive pulmonary disease with acute lower respiratory infection: Secondary | ICD-10-CM | POA: Diagnosis not present

## 2018-11-24 DIAGNOSIS — J449 Chronic obstructive pulmonary disease, unspecified: Secondary | ICD-10-CM | POA: Insufficient documentation

## 2018-11-24 DIAGNOSIS — J181 Lobar pneumonia, unspecified organism: Secondary | ICD-10-CM | POA: Diagnosis not present

## 2018-11-24 DIAGNOSIS — Z87891 Personal history of nicotine dependence: Secondary | ICD-10-CM | POA: Diagnosis not present

## 2018-11-24 DIAGNOSIS — R079 Chest pain, unspecified: Secondary | ICD-10-CM | POA: Diagnosis not present

## 2018-11-24 DIAGNOSIS — I2510 Atherosclerotic heart disease of native coronary artery without angina pectoris: Secondary | ICD-10-CM | POA: Insufficient documentation

## 2018-11-24 DIAGNOSIS — I5032 Chronic diastolic (congestive) heart failure: Secondary | ICD-10-CM | POA: Insufficient documentation

## 2018-11-24 DIAGNOSIS — Z91040 Latex allergy status: Secondary | ICD-10-CM | POA: Diagnosis not present

## 2018-11-24 DIAGNOSIS — Z7951 Long term (current) use of inhaled steroids: Secondary | ICD-10-CM | POA: Diagnosis not present

## 2018-11-24 DIAGNOSIS — Z79899 Other long term (current) drug therapy: Secondary | ICD-10-CM | POA: Insufficient documentation

## 2018-11-24 DIAGNOSIS — R42 Dizziness and giddiness: Secondary | ICD-10-CM | POA: Diagnosis present

## 2018-11-24 DIAGNOSIS — R0602 Shortness of breath: Secondary | ICD-10-CM | POA: Diagnosis not present

## 2018-11-24 DIAGNOSIS — Z79891 Long term (current) use of opiate analgesic: Secondary | ICD-10-CM | POA: Diagnosis not present

## 2018-11-24 DIAGNOSIS — J9621 Acute and chronic respiratory failure with hypoxia: Secondary | ICD-10-CM | POA: Diagnosis not present

## 2018-11-24 DIAGNOSIS — Z951 Presence of aortocoronary bypass graft: Secondary | ICD-10-CM | POA: Insufficient documentation

## 2018-11-24 DIAGNOSIS — E119 Type 2 diabetes mellitus without complications: Secondary | ICD-10-CM | POA: Diagnosis not present

## 2018-11-24 DIAGNOSIS — E785 Hyperlipidemia, unspecified: Secondary | ICD-10-CM | POA: Diagnosis not present

## 2018-11-24 DIAGNOSIS — I4821 Permanent atrial fibrillation: Secondary | ICD-10-CM | POA: Diagnosis not present

## 2018-11-24 DIAGNOSIS — Z7901 Long term (current) use of anticoagulants: Secondary | ICD-10-CM | POA: Diagnosis not present

## 2018-11-24 DIAGNOSIS — Z9981 Dependence on supplemental oxygen: Secondary | ICD-10-CM | POA: Diagnosis not present

## 2018-11-24 LAB — CBC
HCT: 31.3 % — ABNORMAL LOW (ref 36.0–46.0)
Hemoglobin: 9.4 g/dL — ABNORMAL LOW (ref 12.0–15.0)
MCH: 28.4 pg (ref 26.0–34.0)
MCHC: 30 g/dL (ref 30.0–36.0)
MCV: 94.6 fL (ref 80.0–100.0)
Platelets: 146 10*3/uL — ABNORMAL LOW (ref 150–400)
RBC: 3.31 MIL/uL — AB (ref 3.87–5.11)
RDW: 16.8 % — ABNORMAL HIGH (ref 11.5–15.5)
WBC: 6.1 10*3/uL (ref 4.0–10.5)
nRBC: 0 % (ref 0.0–0.2)

## 2018-11-24 LAB — BASIC METABOLIC PANEL
ANION GAP: 7 (ref 5–15)
BUN: 9 mg/dL (ref 8–23)
CHLORIDE: 97 mmol/L — AB (ref 98–111)
CO2: 32 mmol/L (ref 22–32)
Calcium: 9.2 mg/dL (ref 8.9–10.3)
Creatinine, Ser: 0.74 mg/dL (ref 0.44–1.00)
GFR calc Af Amer: >60 mL/min (ref >60)
GFR calc non Af Amer: >60 mL/min (ref >60)
Glucose, Bld: 120 mg/dL — ABNORMAL HIGH (ref 70–99)
Potassium: 5.3 mmol/L — ABNORMAL HIGH (ref 3.5–5.1)
Sodium: 136 mmol/L (ref 135–145)

## 2018-11-24 NOTE — ED Provider Notes (Signed)
[facility] EMERGENCY DEPARTMENT Provider Note   CSN: [id number] Arrival date & time: 11/24/18  1208     History   Chief Complaint Chief Complaint  Patient presents with  . Hypotension    HPI Mary [name] is a 68 y.o. female.  HPI Patient sent in by home health nurse.  Reportedly had low blood pressure at home.  Patient states she feels fine though has had some fatigue.  Has right-sided chest pain from broken ribs.  Home health nurse reviewed medicines on the discharge sheet from recent discharge versus her pill container and did not match up.  Home health nurse reportedly is wondering about this.  I reviewed the medicines and metformin and Celexa were not on the discharge sheet but are in her pills that she got from the pharmacy.  She denies lightheadedness or dizziness.  Denies fever chills.  States he has been taking her medicine.  States she had a follow-up with Dr. Nevada Crane today but she was feeling a little dizzy earlier today.  States that is not uncommon for her before she gets her coffee in the morning.  States she canceled appointment but is going to follow-up with him. Past Medical History:  Diagnosis Date  . Anxiety   . Arthritis   . Atrial fibrillation ([place])   . Bronchial asthma   . CHF (congestive heart failure) ([place])   . Chronic pain    Bacl pain, Disc L5-S1- Dr. Joya Salm in [place]  . COPD (chronic obstructive pulmonary disease) ([place])   . DDD (degenerative disc disease)    Radicular symptoms  . Diabetes mellitus   . History of stress test 06/2011   Abnormal myocardial perfusion study.  Marland Kitchen Hx of echocardiogram    Was interpreted by Dr Doylene Canard that showed an Ef in the 50-60% range with grade 1 diastolic dysfunction. she had moderate MR, biatrial enlargement, moderate TR and at that time estimated RV systolic pressure was 43 mm.  . Hyperlipidemia   . Hypertension   . Neuropathy   . Shortness of breath   . Tachycardia     Patient Active Problem List   Diagnosis Date  Noted  . Respiratory failure ([place]) 11/05/2018  . Rib pain on right side 11/03/2018  . Atrial fibrillation with RVR ([place]) 04/06/2018  . Cellulitis in diabetic foot ([place]) 04/06/2018  . Acute on chronic diastolic CHF (congestive heart failure) ([place]) 01/10/2018  . Sepsis ([place]) 01/08/2018  . AKI (acute kidney injury) ([place]) 01/08/2018  . Acute metabolic encephalopathy 63/78/5885  . Acute respiratory failure with hypoxia and hypercarbia ([place]) 01/08/2018  . Transaminasemia 01/08/2018  . Acute respiratory failure with hypercapnia ([place]) 01/07/2018  . Hyponatremia 01/07/2018  . Anemia 01/07/2018  . Lobar pneumonia ([place]) 11/16/2017  . Chronic diastolic CHF (congestive heart failure) ([place]) 11/16/2017  . Acute respiratory failure with hypoxia ([place]) 11/15/2017  . Malnutrition of moderate degree 02/20/2017  . Acute on chronic respiratory failure with hypoxia ([place]) 02/18/2017  . Hypomagnesemia 02/18/2017  . COPD with acute exacerbation ([place]) 11/04/2016  . Left carotid bruit 09/13/2015  . Noncompliance with medications 03/19/2015  . Gastroenteritis 03/19/2015  . Seasonal allergies 01/25/2015  . Chest pain 05/16/2014  . HTN (hypertension) 05/16/2014  . PSVT (paroxysmal supraventricular tachycardia) ([place]) 05/16/2014  . Acute diastolic CHF (congestive heart failure) ([place]) 05/15/2014  . CAD (coronary artery disease) 05/15/2014  . RBBB 04/25/2014  . Benign skin lesion 03/07/2014  . 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 Atrial fibrillation with rapid ventricular response (  [place]) 01/06/2014  . Leukocytosis 01/06/2014  . Fall at home 09/18/2013  . Hypotension 09/18/2013  . Recurrent falls 09/18/2013  . Tinnitus of left ear 02/19/2013  . Altered mental status 12/06/2012  . Diastolic congestive heart failure ([place]) 09/10/2012  . Atrial fibrillation ([place]) 09/10/2012  . DDD (degenerative disc disease), lumbar 08/26/2012  . Atypical mole 04/14/2012  . Seborrheic keratosis 04/14/2012  . Depression 12/09/2011  . COPD  (chronic obstructive pulmonary disease) ([place]) 06/27/2011  . Anxiety 06/27/2011  . Chronic pain 05/27/2011  . Diabetes mellitus ([place]) 05/26/2011  . Hyperlipidemia 05/26/2011    Past Surgical History:  Procedure Laterality Date  . ABDOMINAL HYSTERECTOMY     partial  . Breast cyst removed    . CARPAL TUNNEL RELEASE     x 2  . CORONARY ARTERY BYPASS GRAFT N/A 01/09/2014   Procedure: CORONARY ARTERY BYPASS GRAFTING (CABG) x 3 using endoscopically harvested right saphenous vein and left internal mammary artery and closure of left atrial appendage;  Surgeon: Ivin Poot, MD;  Location: [facility];  Service: Open Heart Surgery;  Laterality: N/A;  patient has preop IA BP   . INTRAOPERATIVE TRANSESOPHAGEAL ECHOCARDIOGRAM N/A 01/09/2014   Procedure: INTRAOPERATIVE TRANSESOPHAGEAL ECHOCARDIOGRAM;  Surgeon: Ivin Poot, MD;  Location: [facility];  Service: Open Heart Surgery;  Laterality: N/A;  . LEFT HEART CATHETERIZATION WITH CORONARY ANGIOGRAM N/A 01/07/2014   Procedure: LEFT HEART CATHETERIZATION WITH CORONARY ANGIOGRAM;  Surgeon: Lorretta Harp, MD;  Location[facility] C[facility] LAB;  Service: Cardiovascular;  Laterality: N/A;  . TONSILLECTOMY    . TUBAL LIGATION       OB History    Gravida  1   Para  1   Term  1   Preterm      AB      Living  0     SAB      TAB      Ectopic      Multiple      Live Births               Home Medications    Prior to Admission medications   Medication Sig Start Date End Date Taking? Authorizing Provider  acetaminophen (TYLENOL) 500 MG tablet Take 500 mg by mouth every 6 (six) hours as needed for mild pain.    [provider]  albuterol (PROVENTIL HFA;VENTOLIN HFA) 108 (90 Base) MCG/ACT inhaler Inhale 2 puffs into the lungs every 6 (six) hours as needed for shortness of breath. 11/10/18   Orson Eva, MD  apixaban (ELIQUIS) 5 MG TABS tablet Take 1 tablet (5 mg total) by mouth 2 (two) times daily. 11/06/18   Caren Griffins, MD  aspirin EC  81 MG tablet Take 81 mg by mouth daily.    [provider]  atorvastatin (LIPITOR) 40 MG tablet TAKE ONE TABLET BY MOUTH DAILY. 07/25/18   Troy Sine, MD  budesonide-formoterol [facility]) 160-4.5 MCG/ACT inhaler Inhale 2 puffs into the lungs 2 (two) times daily. 11/10/18   Orson Eva, MD  diltiazem (CARDIZEM CD) 240 MG 24 hr capsule Take 1 capsule (240 mg total) by mouth daily. 11/11/18   Orson Eva, MD  furosemide (LASIX) 40 MG tablet TAKE ONE TABLET BY MOUTH ONCE DAILY. 06/23/18   Troy Sine, MD  gabapentin (NEURONTIN) 100 MG capsule Take 100 mg by mouth every 8 (eight) hours as needed (back pain).    [provider]  glipiZIDE (GLUCOTROL XL) 2.5 MG 24 hr tablet Take  2.5 mg by mouth daily.  02/28/16   [provider]  guaiFENesin-dextromethorphan (ROBITUSSIN DM) 100-10 MG/5ML syrup Take 5 mLs by mouth every 4 (four) hours as needed for cough. 11/06/18   Caren Griffins, MD  Iron-FA-B Cmp-C-Biot-Probiotic (FUSION PLUS PO) Take 1 tablet by mouth daily.    [provider]  JANUVIA 100 MG tablet TAKE ONE TABLET BY MOUTH DAILY. 07/19/15   Alycia Rossetti, MD  metoprolol tartrate (LOPRESSOR) 25 MG tablet Take 1 tablet (25 mg total) by mouth 2 (two) times daily. 11/10/18   Orson Eva, MD  predniSONE (DELTASONE) 20 MG tablet Take 1 tablet (20 mg total) by mouth daily with breakfast. 11/07/18   Caren Griffins, MD    Family History Family History  Problem Relation Age of Onset  . Diabetes Mother   . Heart disease Mother   . Diabetes Sister   . Hypertension Sister   . Hyperlipidemia Sister   . Diabetes Brother   . Heart disease Brother   . Diabetes Brother   . Heart disease Brother     Social History Social History   Tobacco Use  . Smoking status: Former Smoker    Packs/day: 1.50    Years: 30.00    Pack years: 45.00    Types: Cigarettes    Last attempt to quit: 05/26/2013    Years since quitting: 5.5  . Smokeless tobacco: Never Used  . Tobacco  comment: smells of heavy smoke 02/18/17  Substance Use Topics  . Alcohol use: No  . Drug use: No     Allergies   Adhesive [tape]; Latex; and Zocor [simvastatin - high dose]   Review of Systems Review of Systems  Constitutional: Negative for appetite change and fever.  Respiratory: Negative for shortness of breath.   Cardiovascular: Positive for chest pain.  Gastrointestinal: Negative for nausea and vomiting.  Genitourinary: Negative for flank pain.  Musculoskeletal: Negative for back pain.  Skin: Negative for rash.  Neurological: Positive for light-headedness.  Psychiatric/Behavioral: Negative for confusion.     Physical Exam Updated Vital Signs BP 106/61 (BP Location: Left Arm)   Pulse 71   Temp (!) 97.4 F (36.3 C) (Temporal)   Resp 14   Ht 5\' 7"  (1.702 m)   Wt 51.7 kg   SpO2 99%   BMI 17.85 kg/m   Physical Exam HENT:     Head: Atraumatic.  Neck:     Musculoskeletal: Neck supple.  Cardiovascular:     Comments: Tenderness to right lateral lower chest wall.  No crepitance or subcu emphysema. Abdominal:     Tenderness: There is abdominal tenderness.     Comments: Right upper quadrant tenderness without rebound or guarding.  Musculoskeletal:        General: No tenderness.  Skin:    Coloration: Skin is not jaundiced.  Neurological:     General: No focal deficit present.      ED Treatments / Results  Labs (all labs ordered are listed, but only abnormal results are displayed) Labs Reviewed  BASIC METABOLIC PANEL - Abnormal; Notable for the following components:      Result Value   Potassium 5.3 (*)    Chloride 97 (*)    Glucose, Bld 120 (*)    All other components within normal limits  CBC - Abnormal; Notable for the following components:   RBC 3.31 (*)    Hemoglobin 9.4 (*)    HCT 31.3 (*)    RDW 16.8 (*)  Platelets 146 (*)    All other components within normal limits    EKG None  Radiology Dg Chest 2 View  Result Date:  11/24/2018 CLINICAL DATA:  Weakness and shortness of breath EXAM: CHEST - 2 VIEW COMPARISON:  11/07/2018 FINDINGS: Cardiac shadow is within normal limits. Postsurgical changes are again seen. Near complete resolution of previously seen bibasilar infiltrates and edema are noted. Some minimal residual effusion is noted on the right with patchy infiltrate in the right middle lobe. No bony abnormality is seen. No new infiltrates are seen. IMPRESSION: Significant improved aeration with mild residual right middle lobe infiltrate and effusion. Electronically Signed   By: Inez Catalina M.D.   On: 11/24/2018 13:59    Procedures Procedures (including critical care time)  Medications Ordered in ED Medications - No data to display   Initial Impression / Assessment and Plan / ED Course  I have reviewed the triage vital signs and the nursing notes.  Pertinent labs & imaging results that were available during my care of the patient were reviewed by me and considered in my medical decision making (see chart for details).     Patient with reported hypotension.  Blood pressure better here.  Patient states she is no longer lightheaded or dizzy.  Work-up reassuring.  Does have mild hyperkalemia.  Will stop potassium for now.  Needs to follow with her PCP for further adjustment of her medications.  Final Clinical Impressions(s) / ED Diagnoses   Final diagnoses:  Hyperkalemia    ED Discharge Orders    None       Davonna Belling, MD 11/24/18 1444

## 2018-11-24 NOTE — ED Triage Notes (Signed)
Home health care nurse sent to ED today for low BP and states patient hospital discharge paper and home meds do not match. She is unsure what she should be taking.

## 2018-11-24 NOTE — ED Triage Notes (Signed)
Pt continues to have right sided rib pain.

## 2018-11-24 NOTE — Discharge Instructions (Addendum)
Follow-up with Dr. Nevada Crane as soon as possible.  Stop your potassium for right now.  He will likely need to restart it in a few days.  Try and keep yourself hydrated.

## 2018-11-25 DIAGNOSIS — J9621 Acute and chronic respiratory failure with hypoxia: Secondary | ICD-10-CM | POA: Diagnosis not present

## 2018-11-25 DIAGNOSIS — Z7982 Long term (current) use of aspirin: Secondary | ICD-10-CM | POA: Diagnosis not present

## 2018-11-25 DIAGNOSIS — I2510 Atherosclerotic heart disease of native coronary artery without angina pectoris: Secondary | ICD-10-CM | POA: Diagnosis not present

## 2018-11-25 DIAGNOSIS — I4821 Permanent atrial fibrillation: Secondary | ICD-10-CM | POA: Diagnosis not present

## 2018-11-25 DIAGNOSIS — J440 Chronic obstructive pulmonary disease with acute lower respiratory infection: Secondary | ICD-10-CM | POA: Diagnosis not present

## 2018-11-25 DIAGNOSIS — E785 Hyperlipidemia, unspecified: Secondary | ICD-10-CM | POA: Diagnosis not present

## 2018-11-25 DIAGNOSIS — Z7951 Long term (current) use of inhaled steroids: Secondary | ICD-10-CM | POA: Diagnosis not present

## 2018-11-25 DIAGNOSIS — Z9981 Dependence on supplemental oxygen: Secondary | ICD-10-CM | POA: Diagnosis not present

## 2018-11-25 DIAGNOSIS — Z79891 Long term (current) use of opiate analgesic: Secondary | ICD-10-CM | POA: Diagnosis not present

## 2018-11-25 DIAGNOSIS — Z7901 Long term (current) use of anticoagulants: Secondary | ICD-10-CM | POA: Diagnosis not present

## 2018-11-25 DIAGNOSIS — I110 Hypertensive heart disease with heart failure: Secondary | ICD-10-CM | POA: Diagnosis not present

## 2018-11-25 DIAGNOSIS — E119 Type 2 diabetes mellitus without complications: Secondary | ICD-10-CM | POA: Diagnosis not present

## 2018-11-25 DIAGNOSIS — Z7984 Long term (current) use of oral hypoglycemic drugs: Secondary | ICD-10-CM | POA: Diagnosis not present

## 2018-11-25 DIAGNOSIS — J181 Lobar pneumonia, unspecified organism: Secondary | ICD-10-CM | POA: Diagnosis not present

## 2018-11-25 DIAGNOSIS — I5033 Acute on chronic diastolic (congestive) heart failure: Secondary | ICD-10-CM | POA: Diagnosis not present

## 2018-11-29 ENCOUNTER — Other Ambulatory Visit: Payer: Self-pay

## 2018-11-29 DIAGNOSIS — Z7951 Long term (current) use of inhaled steroids: Secondary | ICD-10-CM | POA: Diagnosis not present

## 2018-11-29 DIAGNOSIS — I959 Hypotension, unspecified: Secondary | ICD-10-CM | POA: Diagnosis not present

## 2018-11-29 DIAGNOSIS — D509 Iron deficiency anemia, unspecified: Secondary | ICD-10-CM | POA: Diagnosis not present

## 2018-11-29 DIAGNOSIS — Z9981 Dependence on supplemental oxygen: Secondary | ICD-10-CM | POA: Diagnosis not present

## 2018-11-29 DIAGNOSIS — J9621 Acute and chronic respiratory failure with hypoxia: Secondary | ICD-10-CM | POA: Diagnosis not present

## 2018-11-29 DIAGNOSIS — I2510 Atherosclerotic heart disease of native coronary artery without angina pectoris: Secondary | ICD-10-CM | POA: Diagnosis not present

## 2018-11-29 DIAGNOSIS — E785 Hyperlipidemia, unspecified: Secondary | ICD-10-CM | POA: Diagnosis not present

## 2018-11-29 DIAGNOSIS — E875 Hyperkalemia: Secondary | ICD-10-CM | POA: Diagnosis not present

## 2018-11-29 DIAGNOSIS — J440 Chronic obstructive pulmonary disease with acute lower respiratory infection: Secondary | ICD-10-CM | POA: Diagnosis not present

## 2018-11-29 DIAGNOSIS — Z7982 Long term (current) use of aspirin: Secondary | ICD-10-CM | POA: Diagnosis not present

## 2018-11-29 DIAGNOSIS — I5033 Acute on chronic diastolic (congestive) heart failure: Secondary | ICD-10-CM | POA: Diagnosis not present

## 2018-11-29 DIAGNOSIS — Z7901 Long term (current) use of anticoagulants: Secondary | ICD-10-CM | POA: Diagnosis not present

## 2018-11-29 DIAGNOSIS — I110 Hypertensive heart disease with heart failure: Secondary | ICD-10-CM | POA: Diagnosis not present

## 2018-11-29 DIAGNOSIS — J181 Lobar pneumonia, unspecified organism: Secondary | ICD-10-CM | POA: Diagnosis not present

## 2018-11-29 DIAGNOSIS — E119 Type 2 diabetes mellitus without complications: Secondary | ICD-10-CM | POA: Diagnosis not present

## 2018-11-29 DIAGNOSIS — I4821 Permanent atrial fibrillation: Secondary | ICD-10-CM | POA: Diagnosis not present

## 2018-11-29 DIAGNOSIS — Z7984 Long term (current) use of oral hypoglycemic drugs: Secondary | ICD-10-CM | POA: Diagnosis not present

## 2018-11-29 DIAGNOSIS — Z79891 Long term (current) use of opiate analgesic: Secondary | ICD-10-CM | POA: Diagnosis not present

## 2018-11-29 NOTE — Patient Outreach (Signed)
[facility]) Care Management  11/29/2018  Mary [name] 26-May-1951 [id number]    Unsuccessful outreach x 3 attempts. Unsuccessful outreach letter mailed. No response received from member.  PLAN Will complete case closure.    [place] [phone]

## 2018-12-01 DIAGNOSIS — I2510 Atherosclerotic heart disease of native coronary artery without angina pectoris: Secondary | ICD-10-CM | POA: Diagnosis not present

## 2018-12-01 DIAGNOSIS — Z9981 Dependence on supplemental oxygen: Secondary | ICD-10-CM | POA: Diagnosis not present

## 2018-12-01 DIAGNOSIS — Z7984 Long term (current) use of oral hypoglycemic drugs: Secondary | ICD-10-CM | POA: Diagnosis not present

## 2018-12-01 DIAGNOSIS — J440 Chronic obstructive pulmonary disease with acute lower respiratory infection: Secondary | ICD-10-CM | POA: Diagnosis not present

## 2018-12-01 DIAGNOSIS — Z7951 Long term (current) use of inhaled steroids: Secondary | ICD-10-CM | POA: Diagnosis not present

## 2018-12-01 DIAGNOSIS — I5033 Acute on chronic diastolic (congestive) heart failure: Secondary | ICD-10-CM | POA: Diagnosis not present

## 2018-12-01 DIAGNOSIS — I110 Hypertensive heart disease with heart failure: Secondary | ICD-10-CM | POA: Diagnosis not present

## 2018-12-01 DIAGNOSIS — J181 Lobar pneumonia, unspecified organism: Secondary | ICD-10-CM | POA: Diagnosis not present

## 2018-12-01 DIAGNOSIS — E785 Hyperlipidemia, unspecified: Secondary | ICD-10-CM | POA: Diagnosis not present

## 2018-12-01 DIAGNOSIS — I4821 Permanent atrial fibrillation: Secondary | ICD-10-CM | POA: Diagnosis not present

## 2018-12-01 DIAGNOSIS — Z79891 Long term (current) use of opiate analgesic: Secondary | ICD-10-CM | POA: Diagnosis not present

## 2018-12-01 DIAGNOSIS — Z7982 Long term (current) use of aspirin: Secondary | ICD-10-CM | POA: Diagnosis not present

## 2018-12-01 DIAGNOSIS — Z7901 Long term (current) use of anticoagulants: Secondary | ICD-10-CM | POA: Diagnosis not present

## 2018-12-01 DIAGNOSIS — J9621 Acute and chronic respiratory failure with hypoxia: Secondary | ICD-10-CM | POA: Diagnosis not present

## 2018-12-01 DIAGNOSIS — E119 Type 2 diabetes mellitus without complications: Secondary | ICD-10-CM | POA: Diagnosis not present

## 2018-12-06 DIAGNOSIS — Z79891 Long term (current) use of opiate analgesic: Secondary | ICD-10-CM | POA: Diagnosis not present

## 2018-12-06 DIAGNOSIS — Z7982 Long term (current) use of aspirin: Secondary | ICD-10-CM | POA: Diagnosis not present

## 2018-12-06 DIAGNOSIS — I5033 Acute on chronic diastolic (congestive) heart failure: Secondary | ICD-10-CM | POA: Diagnosis not present

## 2018-12-06 DIAGNOSIS — D509 Iron deficiency anemia, unspecified: Secondary | ICD-10-CM | POA: Diagnosis not present

## 2018-12-06 DIAGNOSIS — Z9981 Dependence on supplemental oxygen: Secondary | ICD-10-CM | POA: Diagnosis not present

## 2018-12-06 DIAGNOSIS — Z7901 Long term (current) use of anticoagulants: Secondary | ICD-10-CM | POA: Diagnosis not present

## 2018-12-06 DIAGNOSIS — Z7984 Long term (current) use of oral hypoglycemic drugs: Secondary | ICD-10-CM | POA: Diagnosis not present

## 2018-12-06 DIAGNOSIS — J9621 Acute and chronic respiratory failure with hypoxia: Secondary | ICD-10-CM | POA: Diagnosis not present

## 2018-12-06 DIAGNOSIS — E1140 Type 2 diabetes mellitus with diabetic neuropathy, unspecified: Secondary | ICD-10-CM | POA: Diagnosis not present

## 2018-12-06 DIAGNOSIS — I4821 Permanent atrial fibrillation: Secondary | ICD-10-CM | POA: Diagnosis not present

## 2018-12-06 DIAGNOSIS — I110 Hypertensive heart disease with heart failure: Secondary | ICD-10-CM | POA: Diagnosis not present

## 2018-12-06 DIAGNOSIS — E782 Mixed hyperlipidemia: Secondary | ICD-10-CM | POA: Diagnosis not present

## 2018-12-06 DIAGNOSIS — Z7951 Long term (current) use of inhaled steroids: Secondary | ICD-10-CM | POA: Diagnosis not present

## 2018-12-06 DIAGNOSIS — I2510 Atherosclerotic heart disease of native coronary artery without angina pectoris: Secondary | ICD-10-CM | POA: Diagnosis not present

## 2018-12-06 DIAGNOSIS — E119 Type 2 diabetes mellitus without complications: Secondary | ICD-10-CM | POA: Diagnosis not present

## 2018-12-06 DIAGNOSIS — E785 Hyperlipidemia, unspecified: Secondary | ICD-10-CM | POA: Diagnosis not present

## 2018-12-06 DIAGNOSIS — J440 Chronic obstructive pulmonary disease with acute lower respiratory infection: Secondary | ICD-10-CM | POA: Diagnosis not present

## 2018-12-06 DIAGNOSIS — J181 Lobar pneumonia, unspecified organism: Secondary | ICD-10-CM | POA: Diagnosis not present

## 2018-12-07 DIAGNOSIS — I2510 Atherosclerotic heart disease of native coronary artery without angina pectoris: Secondary | ICD-10-CM | POA: Diagnosis not present

## 2018-12-07 DIAGNOSIS — J449 Chronic obstructive pulmonary disease, unspecified: Secondary | ICD-10-CM | POA: Diagnosis not present

## 2018-12-08 ENCOUNTER — Other Ambulatory Visit: Payer: Self-pay

## 2018-12-08 DIAGNOSIS — J181 Lobar pneumonia, unspecified organism: Secondary | ICD-10-CM | POA: Diagnosis not present

## 2018-12-08 DIAGNOSIS — Z7951 Long term (current) use of inhaled steroids: Secondary | ICD-10-CM | POA: Diagnosis not present

## 2018-12-08 DIAGNOSIS — I4821 Permanent atrial fibrillation: Secondary | ICD-10-CM | POA: Diagnosis not present

## 2018-12-08 DIAGNOSIS — E119 Type 2 diabetes mellitus without complications: Secondary | ICD-10-CM | POA: Diagnosis not present

## 2018-12-08 DIAGNOSIS — I110 Hypertensive heart disease with heart failure: Secondary | ICD-10-CM | POA: Diagnosis not present

## 2018-12-08 DIAGNOSIS — Z79891 Long term (current) use of opiate analgesic: Secondary | ICD-10-CM | POA: Diagnosis not present

## 2018-12-08 DIAGNOSIS — I2510 Atherosclerotic heart disease of native coronary artery without angina pectoris: Secondary | ICD-10-CM | POA: Diagnosis not present

## 2018-12-08 DIAGNOSIS — J440 Chronic obstructive pulmonary disease with acute lower respiratory infection: Secondary | ICD-10-CM | POA: Diagnosis not present

## 2018-12-08 DIAGNOSIS — Z7901 Long term (current) use of anticoagulants: Secondary | ICD-10-CM | POA: Diagnosis not present

## 2018-12-08 DIAGNOSIS — Z9981 Dependence on supplemental oxygen: Secondary | ICD-10-CM | POA: Diagnosis not present

## 2018-12-08 DIAGNOSIS — E785 Hyperlipidemia, unspecified: Secondary | ICD-10-CM | POA: Diagnosis not present

## 2018-12-08 DIAGNOSIS — J9621 Acute and chronic respiratory failure with hypoxia: Secondary | ICD-10-CM | POA: Diagnosis not present

## 2018-12-08 DIAGNOSIS — Z7982 Long term (current) use of aspirin: Secondary | ICD-10-CM | POA: Diagnosis not present

## 2018-12-08 DIAGNOSIS — I5033 Acute on chronic diastolic (congestive) heart failure: Secondary | ICD-10-CM | POA: Diagnosis not present

## 2018-12-08 DIAGNOSIS — Z7984 Long term (current) use of oral hypoglycemic drugs: Secondary | ICD-10-CM | POA: Diagnosis not present

## 2018-12-12 ENCOUNTER — Ambulatory Visit (HOSPITAL_COMMUNITY)
Admission: RE | Admit: 2018-12-12 | Discharge: 2018-12-12 | Disposition: A | Discharge status: Home / Self Care | Payer: Medicare Other | Source: Ambulatory Visit | Attending: Cardiovascular Disease | Admitting: Cardiovascular Disease

## 2018-12-12 DIAGNOSIS — I6523 Occlusion and stenosis of bilateral carotid arteries: Secondary | ICD-10-CM | POA: Diagnosis not present

## 2018-12-13 ENCOUNTER — Other Ambulatory Visit: Payer: Self-pay | Admitting: *Deleted

## 2018-12-13 DIAGNOSIS — E785 Hyperlipidemia, unspecified: Secondary | ICD-10-CM | POA: Diagnosis not present

## 2018-12-13 DIAGNOSIS — J440 Chronic obstructive pulmonary disease with acute lower respiratory infection: Secondary | ICD-10-CM | POA: Diagnosis not present

## 2018-12-13 DIAGNOSIS — Z7951 Long term (current) use of inhaled steroids: Secondary | ICD-10-CM | POA: Diagnosis not present

## 2018-12-13 DIAGNOSIS — I5033 Acute on chronic diastolic (congestive) heart failure: Secondary | ICD-10-CM | POA: Diagnosis not present

## 2018-12-13 DIAGNOSIS — E119 Type 2 diabetes mellitus without complications: Secondary | ICD-10-CM | POA: Diagnosis not present

## 2018-12-13 DIAGNOSIS — I2510 Atherosclerotic heart disease of native coronary artery without angina pectoris: Secondary | ICD-10-CM | POA: Diagnosis not present

## 2018-12-13 DIAGNOSIS — Z7901 Long term (current) use of anticoagulants: Secondary | ICD-10-CM | POA: Diagnosis not present

## 2018-12-13 DIAGNOSIS — I110 Hypertensive heart disease with heart failure: Secondary | ICD-10-CM | POA: Diagnosis not present

## 2018-12-13 DIAGNOSIS — Z7982 Long term (current) use of aspirin: Secondary | ICD-10-CM | POA: Diagnosis not present

## 2018-12-13 DIAGNOSIS — Z79891 Long term (current) use of opiate analgesic: Secondary | ICD-10-CM | POA: Diagnosis not present

## 2018-12-13 DIAGNOSIS — J9621 Acute and chronic respiratory failure with hypoxia: Secondary | ICD-10-CM | POA: Diagnosis not present

## 2018-12-13 DIAGNOSIS — Z9981 Dependence on supplemental oxygen: Secondary | ICD-10-CM | POA: Diagnosis not present

## 2018-12-13 DIAGNOSIS — J181 Lobar pneumonia, unspecified organism: Secondary | ICD-10-CM | POA: Diagnosis not present

## 2018-12-13 DIAGNOSIS — I4821 Permanent atrial fibrillation: Secondary | ICD-10-CM | POA: Diagnosis not present

## 2018-12-13 DIAGNOSIS — Z7984 Long term (current) use of oral hypoglycemic drugs: Secondary | ICD-10-CM | POA: Diagnosis not present

## 2018-12-13 NOTE — Telephone Encounter (Signed)
[facility] called to verify that they are in sytem for pt future refills.

## 2018-12-15 DIAGNOSIS — J9621 Acute and chronic respiratory failure with hypoxia: Secondary | ICD-10-CM | POA: Diagnosis not present

## 2018-12-15 DIAGNOSIS — Z7982 Long term (current) use of aspirin: Secondary | ICD-10-CM | POA: Diagnosis not present

## 2018-12-15 DIAGNOSIS — I110 Hypertensive heart disease with heart failure: Secondary | ICD-10-CM | POA: Diagnosis not present

## 2018-12-15 DIAGNOSIS — Z7951 Long term (current) use of inhaled steroids: Secondary | ICD-10-CM | POA: Diagnosis not present

## 2018-12-15 DIAGNOSIS — I4821 Permanent atrial fibrillation: Secondary | ICD-10-CM | POA: Diagnosis not present

## 2018-12-15 DIAGNOSIS — E119 Type 2 diabetes mellitus without complications: Secondary | ICD-10-CM | POA: Diagnosis not present

## 2018-12-15 DIAGNOSIS — Z9981 Dependence on supplemental oxygen: Secondary | ICD-10-CM | POA: Diagnosis not present

## 2018-12-15 DIAGNOSIS — J181 Lobar pneumonia, unspecified organism: Secondary | ICD-10-CM | POA: Diagnosis not present

## 2018-12-15 DIAGNOSIS — J440 Chronic obstructive pulmonary disease with acute lower respiratory infection: Secondary | ICD-10-CM | POA: Diagnosis not present

## 2018-12-15 DIAGNOSIS — E785 Hyperlipidemia, unspecified: Secondary | ICD-10-CM | POA: Diagnosis not present

## 2018-12-15 DIAGNOSIS — I2510 Atherosclerotic heart disease of native coronary artery without angina pectoris: Secondary | ICD-10-CM | POA: Diagnosis not present

## 2018-12-15 DIAGNOSIS — I5033 Acute on chronic diastolic (congestive) heart failure: Secondary | ICD-10-CM | POA: Diagnosis not present

## 2018-12-15 DIAGNOSIS — Z79891 Long term (current) use of opiate analgesic: Secondary | ICD-10-CM | POA: Diagnosis not present

## 2018-12-15 DIAGNOSIS — Z7901 Long term (current) use of anticoagulants: Secondary | ICD-10-CM | POA: Diagnosis not present

## 2018-12-15 DIAGNOSIS — Z7984 Long term (current) use of oral hypoglycemic drugs: Secondary | ICD-10-CM | POA: Diagnosis not present

## 2018-12-20 ENCOUNTER — Encounter: Payer: Self-pay | Admitting: Cardiovascular Disease

## 2018-12-20 ENCOUNTER — Ambulatory Visit: Payer: Medicare Other | Admitting: Cardiovascular Disease

## 2018-12-20 VITALS — BP 102/58 | HR 102 | Ht 67.0 in | Wt 116.8 lb

## 2018-12-20 DIAGNOSIS — I6523 Occlusion and stenosis of bilateral carotid arteries: Secondary | ICD-10-CM

## 2018-12-20 DIAGNOSIS — Z7901 Long term (current) use of anticoagulants: Secondary | ICD-10-CM

## 2018-12-20 DIAGNOSIS — Z951 Presence of aortocoronary bypass graft: Secondary | ICD-10-CM | POA: Diagnosis not present

## 2018-12-20 DIAGNOSIS — I10 Essential (primary) hypertension: Secondary | ICD-10-CM | POA: Diagnosis not present

## 2018-12-20 DIAGNOSIS — E1159 Type 2 diabetes mellitus with other circulatory complications: Secondary | ICD-10-CM

## 2018-12-20 DIAGNOSIS — J449 Chronic obstructive pulmonary disease, unspecified: Secondary | ICD-10-CM

## 2018-12-20 DIAGNOSIS — I4510 Unspecified right bundle-branch block: Secondary | ICD-10-CM

## 2018-12-20 DIAGNOSIS — E785 Hyperlipidemia, unspecified: Secondary | ICD-10-CM

## 2018-12-20 DIAGNOSIS — I4821 Permanent atrial fibrillation: Secondary | ICD-10-CM

## 2018-12-20 MED ORDER — METOPROLOL TARTRATE 25 MG PO TABS: 25.0000 mg | ORAL_TABLET | Freq: Two times a day (BID) | ORAL | 1 refills | Status: AC

## 2018-12-20 MED ORDER — FUROSEMIDE 20 MG PO TABS: 20.0000 mg | ORAL_TABLET | Freq: Every day | ORAL | 1 refills | Status: AC

## 2018-12-20 NOTE — Patient Instructions (Addendum)
Medication Instructions:  Start Metoprolol Tartrate 1 tablet (25 mg) twice daily. Decrease Furosemide (Lasix) to 20 mg daily. If you need a refill on your cardiac medications before your next appointment, please call your pharmacy.    Follow-Up: At [facility], you and your health needs are our priority.  As part of our continuing mission to provide you with exceptional heart care, we have created designated Provider Care Teams.  These Care Teams include your primary Cardiologist (physician) and Advanced Practice Providers (APPs -  Physician Assistants and Nurse Practitioners) who all work together to provide you with the care you need, when you need it. . Follow up with Dr.Berry for Carotid follow up next available. . Follow up with Dr.Kelly in 6 months.

## 2018-12-20 NOTE — Progress Notes (Signed)
Patient ID: [name], female   DOB: 1951/07/07, 68 y.o.   MRN: [id number]    PCP: Dr. Wende Neighbors  HPI: JOCLYNN LUMB is a 68 y.o. female who presents to the office for a 6 month followup cardiology evaluation.   Ms. Carandang has a history of permanent atrial fibrillation, hypertension, type 2 diabetes mellitus, COPD and long-standing prior history of tobacco use. In February 2014 she was hospitalized and felt to have diastolic congestive heart failure. She has been on low-dose ACE inhibitor. An echo Doppler in November 2013 showed an ejection fraction of 55-60% with grade 1 diastolic dysfunction, moderate mitral regurgitation, moderate tricuspid regurgitation, biatrial enlargement.   On 01/07/2014  she underwent cardiac catheterization after she  presented to the hospital with left-sided chest pain, associated with diaphoresis, weakness, nausea and vomiting.  Cardiac catheterization demonstrated 80% tapered calcified left main stenosis with 3 vessel CAD involving a subtotal proximal to mid LAD stenosis with TIMI 2 flow, an occluded circumflex vessel.  After the first large bifurcating obtuse marginal branch as well as 70% mid AV groove stenosis and a small-caliber occluded mid RCA.  She underwent CABG revascularization surgery on 01/09/2014 by Dr. Tharon Aquas Tright and had a LIMA placed to the LAD, a saphenous vein graft to the ramus intermediate, saphenous vein graft to the circumflex marginal vessel.  She also had application of a left atrial clip.  Preoperative ejection fraction was 30%.  In April 2015 she was on a medical regimen, consisting of Lasix 40 mg, lisinopril 2.5 mg twice a day, digoxin, 0.125 mg, metoprolol tartrate 12.5 twice a day.  An echo Doppler study on 04/17/2014 showed improvement in her Ejection fraction at 55-60%, although she had a pseudo-normal left ventricular filling pattern consistent with grade 2 diastolic dysfunction.  Mild hypokinesis of the mid, anteroseptal, basal,  inferoseptal and apical septal wall.  Mitral annular calcification, mild MR, moderate LA, and mild RA dilatation with mild TR. She had been in sinus rhythm and when last seen by me July was maintaining sinus rhythm.  At that time, I discontinued her Lanoxin and further titrated her metoprolol tartrate from 12.5 twice a day to 25 mg twice a day.  Postoperatively, she had converted initially to sinus rhythm and was in normal sinus rhythm as of her July 2015 evaluation.  A nuclear perfusion study on 01/03/2015 which revealed normal perfusion.  The study was not gated due to her atrial fibrillation.  Apparently, she had recently experienced a presyncopal episode.  She was referred for MR of her brain which raised the possibility of a small nonhemorrhagic infarct within the subcortical white matter of the right postcentral gyrus.  There also is moderate  Age advanced periventricular and subcortical T2 changes.  She is followed by Dr. Buelah Manis. She tells me she is scheduled to have an EEG later this week.  An echo Doppler study in March 2017 showed an EF of 55-60%.  She had trivial aortic insufficiency.  A peak gradient of her aortic valve was 10 mm.  There was mild aortic regurgitation.  She underwent carotid duplex imaging which showed heterogeneous plaque bilaterally.  There were essentially stable 40-59% right internal carotid artery stenoses and 60-79% left internal carotid artery stenoses.  She greater than 50% bilateral external carotid stenosis.  She had normal subclavian arteries bilaterally with antegrade flow to her vertebrals.  She admits to bilateral neuropathy in both lower extremities.  She denies significant edema.  She had had blood work  in [place] in July.  Her cholesterol was 104, triglycerides 103, HDL 38, and LDL 45.  Hemoglobin was 11.4 and hematocrit 35.7.  She does have reduced iron saturation at 8%.   When I last saw her, she was complaining of being under increased stress.  She was staying  fatigue 10, was tired most of the time.  Her sleep was poor.  She was waking up several times per night for nocturia.She was hospitalized on 11/04/2016 through 11/06/2016 with shortness of breath.  He was felt possibly to have combination of mild COPD exacerbation and acute on chronic CHF.  He responded to IV Lasix as well as low-dose steroids and a Z-Pak and ultimately was sent home.  She has been seen by Dr. Wende Neighbors was requested she undergo carotid ultrasound imaging.  When I saw her in 2018 she had experienced issues with leg discomfortShe has had issues with peripheral neuropathy.  Carotid study in April 2018 showed heterogeneous plaque bilaterally with 40-59% right ICA stenosis and 60-79% left ICA stenosis which were stable.  She had patent vertebral arteries with antegrade flow.  She was hospitalized in April 2018 with cough and COPD exacerbation and initially required BiPAP metoprolol was held transiently due to hypertension.    She was hospitalized in early 2019 for pneumonia, in March 2019 acute respiratory failure with hypercapnia COPD with acute exacerbation and required prednisone taper and she has permanent atrial fibrillation.  In 2019 an echo Doppler study showed an EF of 50 to 55%.  There was mild aortic regurgitation and mitral regurgitation.  Her atrium was severely dilated.  Peak PA pressure was increased at 67 mm.  She was last hospitalized in June 2019 for 2 days at that time apparently developed sepsis secondary to right foot cellulitis.  She is now living in a handicap apartment.  She has been using a walker.  At times her legs give out and she has tended to fall.  She denies any episodes of tachycardia.  She denies any recent wheezing.    Since I last saw her in August 2019, she was hospitalized on November 03, 2018 through November 06, 2018 presented with 6 weeks of productive cough.  She was sent home the following day was readmitted from January 13 through November 10, 2018 with  acute on chronic hypoxic respiratory failure due to pulmonary edema she was found to be in rapid A. fib (her A. fib is chronic) and there was fluid overload.  She was discharged on November 10, 2018.  She again was seen at [facility] emergency room on November 24, 2018 with hypotension and complaining of dizziness.  Her chest x-ray at that time showed significantly improved aeration with mild residual right middle lobe infiltrate and infusion.  Her symptoms resolved and she was sent home.  Ms. Leason underwent a follow-up carotid duplex study on December 12, 2018.  This study reveals progression of disease such that the right carotid had heterogeneous and partially calcified plaque at the bifurcation with 50 to 69% stenosis in the left had bifurcation plaque now in the 70 to 99% range.  These are increased from her study 2 years previously.  She presents for reevaluation.  Past Medical History:  Diagnosis Date  . Anxiety   . Arthritis   . Atrial fibrillation ([place])   . Bronchial asthma   . CHF (congestive heart failure) ([place])   . Chronic pain    Bacl pain, Disc L5-S1- Dr. Joya Salm in [place]  .  COPD (chronic obstructive pulmonary disease) ([place])   . DDD (degenerative disc disease)    Radicular symptoms  . Diabetes mellitus   . History of stress test 06/2011   Abnormal myocardial perfusion study.  Marland Kitchen Hx of echocardiogram    Was interpreted by Dr Doylene Canard that showed an Ef in the 50-60% range with grade 1 diastolic dysfunction. she had moderate MR, biatrial enlargement, moderate TR and at that time estimated RV systolic pressure was 43 mm.  . Hyperlipidemia   . Hypertension   . Neuropathy   . Shortness of breath   . Tachycardia     Past Surgical History:  Procedure Laterality Date  . ABDOMINAL HYSTERECTOMY     partial  . Breast cyst removed    . CARPAL TUNNEL RELEASE     x 2  . CORONARY ARTERY BYPASS GRAFT N/A 01/09/2014   Procedure: CORONARY ARTERY BYPASS GRAFTING (CABG) x 3 using endoscopically  harvested right saphenous vein and left internal mammary artery and closure of left atrial appendage;  Surgeon: Ivin Poot, MD;  Location: [facility];  Service: Open Heart Surgery;  Laterality: N/A;  patient has preop IA BP   . INTRAOPERATIVE TRANSESOPHAGEAL ECHOCARDIOGRAM N/A 01/09/2014   Procedure: INTRAOPERATIVE TRANSESOPHAGEAL ECHOCARDIOGRAM;  Surgeon: Ivin Poot, MD;  Location: [place];  Service: Open Heart Surgery;  Laterality: N/A;  . LEFT HEART CATHETERIZATION WITH CORONARY ANGIOGRAM N/A 01/07/2014   Procedure: LEFT HEART CATHETERIZATION WITH CORONARY ANGIOGRAM;  Surgeon: Lorretta Harp, MD;  Location[facility] C[facility] LAB;  Service: Cardiovascular;  Laterality: N/A;  . TONSILLECTOMY    . TUBAL LIGATION      Allergies  Allergen Reactions  . Adhesive [Tape] Other (See Comments)    Tears skin off.   . Latex Other (See Comments)    In tape, tears skin off.  . Zocor [Simvastatin - High Dose] Nausea And Vomiting    Current Outpatient Medications  Medication Sig Dispense Refill  . acetaminophen (TYLENOL) 500 MG tablet Take 500 mg by mouth every 6 (six) hours as needed for mild pain.    Marland Kitchen albuterol (PROVENTIL HFA;VENTOLIN HFA) 108 (90 Base) MCG/ACT inhaler Inhale 2 puffs into the lungs every 6 (six) hours as needed for shortness of breath. 1 Inhaler 1  . ALPRAZolam (XANAX) 1 MG tablet Take 1 tablet by mouth every 8 (eight) hours as needed.    Marland Kitchen apixaban (ELIQUIS) 5 MG TABS tablet Take 1 tablet (5 mg total) by mouth 2 (two) times daily. 60 tablet 0  . aspirin EC 81 MG tablet Take 81 mg by mouth daily.    Marland Kitchen atorvastatin (LIPITOR) 40 MG tablet TAKE ONE TABLET BY MOUTH DAILY. 90 tablet 2  . budesonide-formoterol (SYMBICORT) 160-4.5 MCG/ACT inhaler Inhale 2 puffs into the lungs 2 (two) times daily. 10.2 g 0  . diltiazem (CARDIZEM CD) 240 MG 24 hr capsule Take 1 capsule (240 mg total) by mouth daily. 30 capsule 1  . DULoxetine (CYMBALTA) 60 MG capsule Take 1 capsule by mouth daily.    . furosemide  (LASIX) 20 MG tablet Take 1 tablet (20 mg total) by mouth daily. 90 tablet 1  . glipiZIDE (GLUCOTROL XL) 2.5 MG 24 hr tablet Take 2.5 mg by mouth daily.     Marland Kitchen HYDROcodone-acetaminophen (NORCO/VICODIN) 5-325 MG tablet Take 1 tablet by mouth as needed. For pain    . Iron-FA-B Cmp-C-Biot-Probiotic (FUSION PLUS PO) Take 1 tablet by mouth daily.    Marland Kitchen JANUVIA 100 MG tablet TAKE ONE TABLET  BY MOUTH DAILY. 30 tablet 0  . metFORMIN (GLUCOPHAGE) 850 MG tablet Take 1 tablet by mouth 2 (two) times daily.    . metoprolol tartrate (LOPRESSOR) 25 MG tablet Take 1 tablet (25 mg total) by mouth 2 (two) times daily. 60 tablet 1  . montelukast (SINGULAIR) 10 MG tablet Take 1 tablet by mouth daily.    . pantoprazole (PROTONIX) 40 MG tablet Take 40 mg by mouth daily.     No current facility-administered medications for this visit.     Social History   Socioeconomic History  . Marital status: Single    Spouse name: Not on file  . Number of children: 1  . Years of education: 51  . Highest education level: Not on file  Occupational History  . Not on file  Social Needs  . Financial resource strain: Not on file  . Food insecurity:    Worry: Not on file    Inability: Not on file  . Transportation needs:    Medical: Not on file    Non-medical: Not on file  Tobacco Use  . Smoking status: Former Smoker    Packs/day: 1.50    Years: 30.00    Pack years: 45.00    Types: Cigarettes    Last attempt to quit: 05/26/2013    Years since quitting: 5.5  . Smokeless tobacco: Never Used  . Tobacco comment: smells of heavy smoke 02/18/17  Substance and Sexual Activity  . Alcohol use: No  . Drug use: No  . Sexual activity: Yes    Birth control/protection: Surgical  Lifestyle  . Physical activity:    Days per week: Not on file    Minutes per session: Not on file  . Stress: Not on file  Relationships  . Social connections:    Talks on phone: Not on file    Gets together: Not on file    Attends religious  service: Not on file    Active member of club or organization: Not on file    Attends meetings of clubs or organizations: Not on file    Relationship status: Not on file  . Intimate partner violence:    Fear of current or ex partner: Not on file    Emotionally abused: Not on file    Physically abused: Not on file    Forced sexual activity: Not on file  Other Topics Concern  . Not on file  Social History Narrative   Lives at home with sister and sister-in-law   Caffeine use : drink 1 cup coffee in morning     Socially she is divorce. She has one deceased child and 2 grandchildren. She quit tobacco at the end of August 2014.  She now has one great grandchild. There is no alcohol use. She does not routinely exercise.  Family History  Problem Relation Age of Onset  . Diabetes Mother   . Heart disease Mother   . Diabetes Sister   . Hypertension Sister   . Hyperlipidemia Sister   . Diabetes Brother   . Heart disease Brother   . Diabetes Brother   . Heart disease Brother    ROS General: Negative; No fevers, chills, or night sweats; positive for fatigue HEENT: Negative; No changes in vision or hearing, sinus congestion, difficulty swallowing Pulmonary: Positive for mild shortness of breath; history of COPD ;No cough, wheezing, , hemoptysis Cardiovascular: Positive for palpitations.  Mild shortness of breath with activity.  No chest pressure. GI: Positive for occasional nausea,  no vomiting, diarrhea, or abdominal pain GU: Negative; No dysuria, hematuria, or difficulty voiding Musculoskeletal:   intermittent leg weakness Hematologic/Oncology: Negative; no easy bruising, bleeding Endocrine: Negative; no heat/cold intolerance; positive for diabetes Neuro: Positive for peripheral neuropathy Skin: Negative; No rashes or skin lesions Psychiatric: positive for anxiety depression Sleep: Negative; No snoring, daytime sleepiness, hypersomnolence, bruxism, restless legs, hypnogognic  hallucinations, no cataplexy Other comprehensive 14 point system review is negative   PE BP (!) 102/58   Pulse (!) 102   Ht _0  (1.702 m)   Wt 116 lb 12.8 oz (53 kg)   BMI 18.29 kg/m    Repeat blood pressure by me was 114/64 supine and 108/62 standing  Wt Readings from Last 3 Encounters:  12/20/18 116 lb 12.8 oz (53 kg)  11/24/18 114 lb (51.7 kg)  11/10/18 114 lb 13.8 oz (52.1 kg)   General: Alert, oriented, no distress.  Skin: normal turgor, no rashes, warm and dry HEENT: Normocephalic, atraumatic. Pupils equal round and reactive to light; sclera anicteric; extraocular muscles intact;  Nose without nasal septal hypertrophy Mouth/Parynx benign; Mallinpatti scale 3 Neck: No JVD, no carotid bruits; normal carotid upstroke Lungs: clear to ausculatation and percussion; no wheezing or rales Chest wall: without tenderness to palpitation Heart: PMI not displaced, really irregular compatible with atrial fibrillation and increased rate at approximately 100 bpm, s1 s2 normal, 1/6 systolic murmur, no diastolic murmur, no rubs, gallops, thrills, or heaves Abdomen: soft, nontender; no hepatosplenomehaly, BS+; abdominal aorta nontender and not dilated by palpation. Back: no CVA tenderness Pulses 2+ Musculoskeletal: full range of motion, normal strength, no joint deformities Extremities: no clubbing cyanosis or edema, Homan's sign negative  Neurologic: grossly nonfocal; Cranial nerves grossly wnl Psychologic: Normal mood and affect   ECG (independently read by me): Atrial fibrillation with a ventricular rate at 102 bpm.  Right bundle branch block with repolarization changes.  Mild inferior T wave abnormality.  August 2019 ECG (independently read by me): AF at 77; RBBBB with repolarization  October 2018 ECG (independently read by me): Atrial fibrillation at 67 bpm.  Right bundle branch block with repolarization changes.  T-wave abnormality inferolaterally  March 2018 ECG (independently  read by me): Atrial fibrillation at 66 bpm.  Right bundle branch block with repolarization changes.  September 2017 ECG (independently read by me): Atrial fibrillation at 74 bpm with her previously noted chronic right bundle branch lock and T-wave abnormalities.  May 2017 ECG (independently read by me): Atrial fibrillation at 75 bpm with right bundle branch block and repolarization changes  November 2016 ECG (independently read by me):  Atrial fibrillation with a controlled ventricular response at 69 bpm. Right bundle branch block.  May 2016 ECG (independently read by me):  Atrial fibrillation at 75 bpm.  Right bundle branch block with repolarization changes.  December 2015ECG (independently read by me) atrial fibrillation with a ventricular rate at 10 2 bpm.  Right bundle-branch block with repolarization changes.  Nonspecific ST changes.  09/04/2014 ECG (independently read by me): atrial fibrillation with ventricular response at 1 20 bpm with right bundle branch block, repolarization changes.  04/25/2014 ECG (independently read by me): Normal sinus rhythm at 63 beats per minute.  Recommend followup with repolarization changes.  Prior April 2015 ECG: Normal sinus rhythm with right bundle branch block and repolarization changes.  Prior 10/04/2013 ECG: Atrial fibrillation at a rate At approximately 100-120 beats per minute. Nonspecific ST changes.  LABS: I reviewed lab work done by Dr. Wende Neighbors  In 2018.  Total cholesterol 128, HDL 57, triglycerides 85, LDL 54. She had been on steroids, her glucose was elevated at 151.  Her hemoglobin A1c had increased to 6.7.  I have reviewed the patient's several hospitalizations and evaluations since my last office visit in August 2019.  BMP Latest Ref Rng & Units 11/24/2018 11/10/2018 11/09/2018  Glucose 70 - 99 mg/dL 120(H) 173(H) 167(H)  BUN 8 - 23 mg/dL _0 Creatinine 0.44 - 1.00 mg/dL 0.74 0.61 0.71  Sodium 135 - 145 mmol/L 136 137 138    Potassium 3.5 - 5.1 mmol/L 5.3(H) 3.7 4.0  Chloride 98 - 111 mmol/L 97(L) 96(L) 96(L)  CO2 22 - 32 mmol/L 32 34(H) 34(H)  Calcium 8.9 - 10.3 mg/dL 9.2 9.0 9.1   Hepatic Function Latest Ref Rng & Units 11/08/2018 11/07/2018 11/03/2018  Total Protein 6.5 - 8.1 g/dL 6.2(L) 7.7 7.5  Albumin 3.5 - 5.0 g/dL 2.9(L) 3.6 3.4(L)  AST 15 - 41 U/L 13(L) 19 20  ALT 0 - 44 U/L _1 Alk Phosphatase 38 - 126 U/L 48 56 57  Total Bilirubin 0.3 - 1.2 mg/dL 0.5 0.4 0.5  Bilirubin, Direct 0.0 - 0.5 mg/dL - - -   CBC Latest Ref Rng & Units 11/24/2018 11/09/2018 11/08/2018  WBC 4.0 - 10.5 K/uL 6.1 12.0(H) 11.8(H)  Hemoglobin 12.0 - 15.0 g/dL 9.4(L) 9.7(L) 9.1(L)  Hematocrit 36.0 - 46.0 % 31.3(L) 31.0(L) 30.1(L)  Platelets 150 - 400 K/uL 146(L) 299 304   Lab Results  Component Value Date   MCV 94.6 11/24/2018   MCV 91.7 11/09/2018   MCV 93.8 11/08/2018   Lab Results  Component Value Date   TSH 0.698 05/15/2014    Lab Results  Component Value Date   HGBA1C 6.7 (H) 04/06/2018   Lipid Panel     Component Value Date/Time   CHOL 186 09/28/2014 1115   TRIG 144 09/28/2014 1115   HDL 55 09/28/2014 1115   CHOLHDL 3.4 09/28/2014 1115   VLDL 29 09/28/2014 1115   LDLCALC 102 (H) 09/28/2014 1115   LDLDIRECT 66 04/14/2012 1200    RADIOLOGY: No results found.  IMPRESSION:  1. Essential hypertension   2. Permanent atrial fibrillation   3. Hx of CABG   4. Bilateral carotid artery occlusion   5. RBBB   6. Anticoagulation adequate   7. Chronic obstructive pulmonary disease, unspecified COPD type ([place])   8. Hyperlipidemia with target LDL less than 70   9. Type 2 diabetes mellitus with other circulatory complication, without long-term current use of insulin ([facility])     ASSESSMENT AND PLAN: Ms. Mersereau is a 68 year old Caucasian female who has permanent atrial fibrillation and previously was on Pradaxa and most recently is on Eliquis for anticoagulation. She has a history of significant prior tobacco  use, but fortunately  quit smoking.  On 01/09/2014 she underwent CABG surgery for severe multivessel CAD, including left main stenosis.  At that time, she did have a left atrial clip.  Preoperatively, her ejection fraction was 30%.  Postoperatively, her ejection fraction had improved to 55-60%.  Her last re echo Doppler  in March 2019  showed normal systolic function with severe biatrial enlargement secondary to her AF mild AR, mild MR, moderate TR and increased PA pressure at 67 mm.  She has had several hospitalizations with volume overload in addition to respiratory failure.  Recently, she was hospitalized with hypotension.  On exam today her blood pressure is low at  mildly drops.  There are no signs of edema and she does not have any rales on physical examination.  I have suggested a slight reduction of her furosemide dose from 40 mg down to 20 mg.  Her atrial fibrillation rate is increased and apparently she had only been taking metoprolol tartrate 12.5 mg twice a day.  I am titrating this back up to 25 mg twice a day.  I reviewed her carotid duplex study and compared the most recent study to her prior evaluation in April 2018.  She now has progressive stenoses which is now interpreted as 70 to 99% on the left which previously had been 60 to 79%.  I have asked that she see Dr. Gwenlyn Found to establish PV care regarding her carotid artery disease and for further evaluation.  She continues to be on atorvastatin 40 mg for hyperlipidemia with target LDL less than 70.  She is followed by Dr. Nevada Crane for primary care.  She is on glipizide, metformin and Januvia for her type 2 diabetes mellitus.  I will see her in 6 months for follow-up evaluation or sooner if problems arise.  Time spent: 30 minutes  Troy Sine, MD, [facility]  12/22/2018 5:54 PM

## 2018-12-22 ENCOUNTER — Encounter: Payer: Self-pay | Admitting: Cardiovascular Disease

## 2018-12-23 DIAGNOSIS — J9610 Chronic respiratory failure, unspecified whether with hypoxia or hypercapnia: Secondary | ICD-10-CM | POA: Diagnosis not present

## 2018-12-23 DIAGNOSIS — I482 Chronic atrial fibrillation, unspecified: Secondary | ICD-10-CM | POA: Diagnosis not present

## 2018-12-23 DIAGNOSIS — I480 Paroxysmal atrial fibrillation: Secondary | ICD-10-CM | POA: Diagnosis not present

## 2018-12-23 DIAGNOSIS — I5032 Chronic diastolic (congestive) heart failure: Secondary | ICD-10-CM | POA: Diagnosis not present

## 2019-01-02 DIAGNOSIS — D509 Iron deficiency anemia, unspecified: Secondary | ICD-10-CM | POA: Diagnosis not present

## 2019-01-02 DIAGNOSIS — E1142 Type 2 diabetes mellitus with diabetic polyneuropathy: Secondary | ICD-10-CM | POA: Diagnosis not present

## 2019-01-02 DIAGNOSIS — I2510 Atherosclerotic heart disease of native coronary artery without angina pectoris: Secondary | ICD-10-CM | POA: Diagnosis not present

## 2019-01-02 DIAGNOSIS — D631 Anemia in chronic kidney disease: Secondary | ICD-10-CM | POA: Diagnosis not present

## 2019-01-02 DIAGNOSIS — N184 Chronic kidney disease, stage 4 (severe): Secondary | ICD-10-CM | POA: Diagnosis not present

## 2019-01-05 DIAGNOSIS — L03119 Cellulitis of unspecified part of limb: Secondary | ICD-10-CM | POA: Diagnosis not present

## 2019-01-05 DIAGNOSIS — J9611 Chronic respiratory failure with hypoxia: Secondary | ICD-10-CM | POA: Diagnosis not present

## 2019-01-05 DIAGNOSIS — J449 Chronic obstructive pulmonary disease, unspecified: Secondary | ICD-10-CM | POA: Diagnosis not present

## 2019-01-05 DIAGNOSIS — I5033 Acute on chronic diastolic (congestive) heart failure: Secondary | ICD-10-CM | POA: Diagnosis not present

## 2019-01-06 ENCOUNTER — Other Ambulatory Visit: Payer: Self-pay | Admitting: Cardiovascular Disease

## 2019-01-06 MED ORDER — DILTIAZEM HCL ER COATED BEADS 240 MG PO CP24: 240.0000 mg | ORAL_CAPSULE | Freq: Every day | ORAL | 1 refills | Status: AC

## 2019-01-06 MED ORDER — APIXABAN 5 MG PO TABS: 5.0000 mg | ORAL_TABLET | Freq: Two times a day (BID) | ORAL | 1 refills | Status: AC

## 2019-01-06 MED ORDER — ATORVASTATIN CALCIUM 40 MG PO TABS: 40.0000 mg | ORAL_TABLET | Freq: Every day | ORAL | 3 refills | Status: AC

## 2019-01-06 NOTE — Telephone Encounter (Signed)
New Message  Cordelia Pen from [facility] needs updated medication list for patient.  Please call her at [phone] and her fax is [phone].

## 2019-01-06 NOTE — Telephone Encounter (Signed)
Returned call to [place] from [facility] who needed an updated medication list for patient. Faxed list to number provided [phone]. Also requesting refills for atorvastatin, diltiazem, and apixaban. Refills for atorvastatin and diltiazem e-prescribed. Message routed to pharmd for apixaban.

## 2019-01-08 ENCOUNTER — Encounter (HOSPITAL_COMMUNITY): Payer: Self-pay

## 2019-01-08 ENCOUNTER — Observation Stay (HOSPITAL_COMMUNITY): Payer: Medicare Other

## 2019-01-08 ENCOUNTER — Other Ambulatory Visit: Payer: Self-pay

## 2019-01-08 ENCOUNTER — Inpatient Hospital Stay (HOSPITAL_COMMUNITY)
Admission: EM | Admit: 2019-01-08 | Discharge: 2019-01-10 | DRG: 917 | Disposition: A | Discharge status: Home Health | Payer: Medicare Other | Attending: Internal Medicine | Admitting: Internal Medicine

## 2019-01-08 ENCOUNTER — Emergency Department (HOSPITAL_COMMUNITY): Payer: Medicare Other

## 2019-01-08 DIAGNOSIS — Z8349 Family history of other endocrine, nutritional and metabolic diseases: Secondary | ICD-10-CM | POA: Diagnosis not present

## 2019-01-08 DIAGNOSIS — T50904A Poisoning by unspecified drugs, medicaments and biological substances, undetermined, initial encounter: Secondary | ICD-10-CM | POA: Diagnosis not present

## 2019-01-08 DIAGNOSIS — I517 Cardiomegaly: Secondary | ICD-10-CM | POA: Diagnosis not present

## 2019-01-08 DIAGNOSIS — J449 Chronic obstructive pulmonary disease, unspecified: Secondary | ICD-10-CM | POA: Diagnosis present

## 2019-01-08 DIAGNOSIS — F419 Anxiety disorder, unspecified: Secondary | ICD-10-CM | POA: Diagnosis present

## 2019-01-08 DIAGNOSIS — G8929 Other chronic pain: Secondary | ICD-10-CM | POA: Diagnosis present

## 2019-01-08 DIAGNOSIS — R4182 Altered mental status, unspecified: Secondary | ICD-10-CM | POA: Diagnosis present

## 2019-01-08 DIAGNOSIS — J9622 Acute and chronic respiratory failure with hypercapnia: Secondary | ICD-10-CM | POA: Diagnosis not present

## 2019-01-08 DIAGNOSIS — I5033 Acute on chronic diastolic (congestive) heart failure: Secondary | ICD-10-CM | POA: Diagnosis not present

## 2019-01-08 DIAGNOSIS — D649 Anemia, unspecified: Secondary | ICD-10-CM | POA: Diagnosis present

## 2019-01-08 DIAGNOSIS — Z8249 Family history of ischemic heart disease and other diseases of the circulatory system: Secondary | ICD-10-CM | POA: Diagnosis not present

## 2019-01-08 DIAGNOSIS — T424X1A Poisoning by benzodiazepines, accidental (unintentional), initial encounter: Secondary | ICD-10-CM | POA: Diagnosis not present

## 2019-01-08 DIAGNOSIS — R0902 Hypoxemia: Secondary | ICD-10-CM

## 2019-01-08 DIAGNOSIS — E785 Hyperlipidemia, unspecified: Secondary | ICD-10-CM | POA: Diagnosis not present

## 2019-01-08 DIAGNOSIS — I4891 Unspecified atrial fibrillation: Secondary | ICD-10-CM | POA: Diagnosis present

## 2019-01-08 DIAGNOSIS — Z23 Encounter for immunization: Secondary | ICD-10-CM | POA: Diagnosis not present

## 2019-01-08 DIAGNOSIS — R092 Respiratory arrest: Secondary | ICD-10-CM | POA: Diagnosis not present

## 2019-01-08 DIAGNOSIS — G9341 Metabolic encephalopathy: Secondary | ICD-10-CM | POA: Diagnosis present

## 2019-01-08 DIAGNOSIS — E872 Acidosis: Secondary | ICD-10-CM | POA: Diagnosis not present

## 2019-01-08 DIAGNOSIS — Z951 Presence of aortocoronary bypass graft: Secondary | ICD-10-CM

## 2019-01-08 DIAGNOSIS — R0681 Apnea, not elsewhere classified: Secondary | ICD-10-CM | POA: Diagnosis not present

## 2019-01-08 DIAGNOSIS — I4510 Unspecified right bundle-branch block: Secondary | ICD-10-CM | POA: Diagnosis not present

## 2019-01-08 DIAGNOSIS — T40601A Poisoning by unspecified narcotics, accidental (unintentional), initial encounter: Secondary | ICD-10-CM | POA: Diagnosis present

## 2019-01-08 DIAGNOSIS — R4020 Unspecified coma: Secondary | ICD-10-CM | POA: Diagnosis not present

## 2019-01-08 DIAGNOSIS — E1151 Type 2 diabetes mellitus with diabetic peripheral angiopathy without gangrene: Secondary | ICD-10-CM | POA: Diagnosis present

## 2019-01-08 DIAGNOSIS — Z7951 Long term (current) use of inhaled steroids: Secondary | ICD-10-CM

## 2019-01-08 DIAGNOSIS — J9621 Acute and chronic respiratory failure with hypoxia: Secondary | ICD-10-CM | POA: Diagnosis not present

## 2019-01-08 DIAGNOSIS — Z7982 Long term (current) use of aspirin: Secondary | ICD-10-CM | POA: Diagnosis not present

## 2019-01-08 DIAGNOSIS — R000 Tachycardia, unspecified: Secondary | ICD-10-CM | POA: Diagnosis not present

## 2019-01-08 DIAGNOSIS — I110 Hypertensive heart disease with heart failure: Secondary | ICD-10-CM | POA: Diagnosis present

## 2019-01-08 DIAGNOSIS — G92 Toxic encephalopathy: Secondary | ICD-10-CM | POA: Diagnosis present

## 2019-01-08 DIAGNOSIS — Z888 Allergy status to other drugs, medicaments and biological substances status: Secondary | ICD-10-CM

## 2019-01-08 DIAGNOSIS — Z91040 Latex allergy status: Secondary | ICD-10-CM

## 2019-01-08 DIAGNOSIS — Z7901 Long term (current) use of anticoagulants: Secondary | ICD-10-CM

## 2019-01-08 DIAGNOSIS — E119 Type 2 diabetes mellitus without complications: Secondary | ICD-10-CM

## 2019-01-08 DIAGNOSIS — R680 Hypothermia, not associated with low environmental temperature: Secondary | ICD-10-CM | POA: Diagnosis present

## 2019-01-08 DIAGNOSIS — Z833 Family history of diabetes mellitus: Secondary | ICD-10-CM

## 2019-01-08 DIAGNOSIS — I447 Left bundle-branch block, unspecified: Secondary | ICD-10-CM | POA: Diagnosis not present

## 2019-01-08 DIAGNOSIS — Z7984 Long term (current) use of oral hypoglycemic drugs: Secondary | ICD-10-CM | POA: Diagnosis not present

## 2019-01-08 DIAGNOSIS — R0602 Shortness of breath: Secondary | ICD-10-CM | POA: Diagnosis not present

## 2019-01-08 DIAGNOSIS — J90 Pleural effusion, not elsewhere classified: Secondary | ICD-10-CM | POA: Diagnosis not present

## 2019-01-08 DIAGNOSIS — F329 Major depressive disorder, single episode, unspecified: Secondary | ICD-10-CM | POA: Diagnosis present

## 2019-01-08 DIAGNOSIS — E1159 Type 2 diabetes mellitus with other circulatory complications: Secondary | ICD-10-CM | POA: Diagnosis not present

## 2019-01-08 DIAGNOSIS — R404 Transient alteration of awareness: Secondary | ICD-10-CM | POA: Diagnosis not present

## 2019-01-08 DIAGNOSIS — I2510 Atherosclerotic heart disease of native coronary artery without angina pectoris: Secondary | ICD-10-CM | POA: Diagnosis present

## 2019-01-08 DIAGNOSIS — Z9981 Dependence on supplemental oxygen: Secondary | ICD-10-CM

## 2019-01-08 DIAGNOSIS — E1140 Type 2 diabetes mellitus with diabetic neuropathy, unspecified: Secondary | ICD-10-CM | POA: Diagnosis not present

## 2019-01-08 DIAGNOSIS — Z87891 Personal history of nicotine dependence: Secondary | ICD-10-CM

## 2019-01-08 DIAGNOSIS — I4821 Permanent atrial fibrillation: Secondary | ICD-10-CM | POA: Diagnosis not present

## 2019-01-08 DIAGNOSIS — E1165 Type 2 diabetes mellitus with hyperglycemia: Secondary | ICD-10-CM | POA: Diagnosis not present

## 2019-01-08 LAB — COMPREHENSIVE METABOLIC PANEL WITH GFR
ALT: 14 U/L (ref 0–44)
AST: 23 U/L (ref 15–41)
Albumin: 3.8 g/dL (ref 3.5–5.0)
Alkaline Phosphatase: 67 U/L (ref 38–126)
Anion gap: 10 (ref 5–15)
BUN: 16 mg/dL (ref 8–23)
CO2: 28 mmol/L (ref 22–32)
Calcium: 9.1 mg/dL (ref 8.9–10.3)
Chloride: 98 mmol/L (ref 98–111)
Creatinine, Ser: 0.93 mg/dL (ref 0.44–1.00)
GFR calc Af Amer: >60 mL/min
GFR calc non Af Amer: >60 mL/min
Glucose, Bld: 208 mg/dL — ABNORMAL HIGH (ref 70–99)
Potassium: 4.7 mmol/L (ref 3.5–5.1)
Sodium: 136 mmol/L (ref 135–145)
Total Bilirubin: 0.7 mg/dL (ref 0.3–1.2)
Total Protein: 7.4 g/dL (ref 6.5–8.1)

## 2019-01-08 LAB — BLOOD GAS, ARTERIAL
ACID-BASE EXCESS: 3.4 mmol/L — AB (ref 0.0–2.0)
Acid-Base Excess: 4.4 mmol/L — ABNORMAL HIGH (ref 0.0–2.0)
Bicarbonate: 26 mmol/L (ref 20.0–28.0)
Bicarbonate: 25.7 mmol/L (ref 20.0–28.0)
Delivery systems: POSITIVE
Drawn by: 33100
Expiratory PAP: 6
FIO2: 80
FIO2: 50
Inspiratory PAP: 16
O2 Saturation: 91.9 %
O2 Saturation: 91.3 %
PCO2 ART: 86.2 mmHg — AB (ref 32.0–48.0)
Patient temperature: 36.3
Patient temperature: 35
RATE: 8 resp/min
pCO2 arterial: 118 mmHg (ref 32.0–48.0)
pH, Arterial: 7.087 — CL (ref 7.350–7.450)
pH, Arterial: 7.183 — CL (ref 7.350–7.450)
pO2, Arterial: 88.9 mmHg (ref 83.0–108.0)
pO2, Arterial: 73.1 mmHg — ABNORMAL LOW (ref 83.0–108.0)

## 2019-01-08 LAB — CBC WITH DIFFERENTIAL/PLATELET
Abs Immature Granulocytes: 0.1 10*3/uL — ABNORMAL HIGH (ref 0.00–0.07)
Basophils Absolute: 0.1 10*3/uL (ref 0.0–0.1)
Basophils Relative: 1 %
EOS PCT: 0 %
Eosinophils Absolute: 0 10*3/uL (ref 0.0–0.5)
HCT: 29.4 % — ABNORMAL LOW (ref 36.0–46.0)
Hemoglobin: 8.8 g/dL — ABNORMAL LOW (ref 12.0–15.0)
Immature Granulocytes: 1 %
LYMPHS PCT: 7 %
Lymphs Abs: 0.9 10*3/uL (ref 0.7–4.0)
MCH: 29 pg (ref 26.0–34.0)
MCHC: 29.9 g/dL — ABNORMAL LOW (ref 30.0–36.0)
MCV: 97 fL (ref 80.0–100.0)
Monocytes Absolute: 1 10*3/uL (ref 0.1–1.0)
Monocytes Relative: 8 %
Neutro Abs: 11 10*3/uL — ABNORMAL HIGH (ref 1.7–7.7)
Neutrophils Relative %: 83 %
Platelets: 208 10*3/uL (ref 150–400)
RBC: 3.03 MIL/uL — ABNORMAL LOW (ref 3.87–5.11)
RDW: 17 % — ABNORMAL HIGH (ref 11.5–15.5)
WBC: 13 10*3/uL — ABNORMAL HIGH (ref 4.0–10.5)
nRBC: 0 % (ref 0.0–0.2)

## 2019-01-08 LAB — TROPONIN I
Troponin I: <0.03 ng/mL (ref <0.03)
Troponin I: <0.03 ng/mL (ref <0.03)

## 2019-01-08 LAB — BRAIN NATRIURETIC PEPTIDE: B Natriuretic Peptide: 357 pg/mL — ABNORMAL HIGH (ref 0.0–100.0)

## 2019-01-08 LAB — INFLUENZA PANEL BY PCR (TYPE A & B)
Influenza A By PCR: NEGATIVE
Influenza B By PCR: NEGATIVE

## 2019-01-08 LAB — MRSA PCR SCREENING: MRSA by PCR: NEGATIVE

## 2019-01-08 LAB — LACTIC ACID, PLASMA: Lactic Acid, Venous: 1.7 mmol/L (ref 0.5–1.9)

## 2019-01-08 LAB — GLUCOSE, CAPILLARY: Glucose-Capillary: 279 mg/dL — ABNORMAL HIGH (ref 70–99)

## 2019-01-08 MED ORDER — INFLUENZA VAC SPLIT HIGH-DOSE 0.5 ML IM SUSY
0.5000 mL | PREFILLED_SYRINGE | INTRAMUSCULAR | Status: AC
  Administered 2019-01-10: 0.5 mL via INTRAMUSCULAR
  Filled 2019-01-08 (×2): qty 0.5

## 2019-01-08 MED ORDER — NALOXONE HCL 2 MG/2ML IJ SOSY
PREFILLED_SYRINGE | INTRAMUSCULAR | Status: AC
  Administered 2019-01-08: 1 mg via INTRAVENOUS
  Filled 2019-01-08: qty 2

## 2019-01-08 MED ORDER — MAGNESIUM SULFATE 2 GM/50ML IV SOLN
2.0000 g | Freq: Once | INTRAVENOUS | Status: AC
  Administered 2019-01-08: 2 g via INTRAVENOUS
  Filled 2019-01-08: qty 50

## 2019-01-08 MED ORDER — SODIUM CHLORIDE 0.9 % IV SOLN
INTRAVENOUS | Status: DC | PRN
  Administered 2019-01-08: 500 mL via INTRAVENOUS

## 2019-01-08 MED ORDER — PIPERACILLIN-TAZOBACTAM 3.375 G IVPB
3.3750 g | Freq: Three times a day (TID) | INTRAVENOUS | Status: DC
  Administered 2019-01-08 – 2019-01-10 (×5): 3.375 g via INTRAVENOUS
  Filled 2019-01-08 (×5): qty 50

## 2019-01-08 MED ORDER — NALOXONE HCL 2 MG/2ML IJ SOSY: 1.0000 mg | PREFILLED_SYRINGE | Freq: Once | INTRAMUSCULAR | Status: DC

## 2019-01-08 MED ORDER — PIPERACILLIN-TAZOBACTAM 3.375 G IVPB 30 MIN
3.3750 g | Freq: Once | INTRAVENOUS | Status: AC
  Administered 2019-01-08: 3.375 g via INTRAVENOUS
  Filled 2019-01-08: qty 50

## 2019-01-08 MED ORDER — INSULIN ASPART 100 UNIT/ML ~~LOC~~ SOLN
0.0000 [IU] | Freq: Three times a day (TID) | SUBCUTANEOUS | Status: DC
  Administered 2019-01-09: 3 [IU] via SUBCUTANEOUS
  Administered 2019-01-09: 2 [IU] via SUBCUTANEOUS
  Administered 2019-01-09: 3 [IU] via SUBCUTANEOUS
  Administered 2019-01-10: 2 [IU] via SUBCUTANEOUS
  Administered 2019-01-10: 3 [IU] via SUBCUTANEOUS

## 2019-01-08 MED ORDER — ACETAMINOPHEN 650 MG RE SUPP: 650.0000 mg | Freq: Four times a day (QID) | RECTAL | Status: DC | PRN

## 2019-01-08 MED ORDER — VANCOMYCIN HCL 500 MG IV SOLR
500.0000 mg | Freq: Two times a day (BID) | INTRAVENOUS | Status: DC
  Administered 2019-01-09 – 2019-01-10 (×3): 500 mg via INTRAVENOUS
  Filled 2019-01-08 (×6): qty 500

## 2019-01-08 MED ORDER — IPRATROPIUM-ALBUTEROL 0.5-2.5 (3) MG/3ML IN SOLN
3.0000 mL | Freq: Once | RESPIRATORY_TRACT | Status: AC
  Administered 2019-01-08: 3 mL via RESPIRATORY_TRACT
  Filled 2019-01-08: qty 3

## 2019-01-08 MED ORDER — ALBUTEROL SULFATE (2.5 MG/3ML) 0.083% IN NEBU
2.5000 mg | INHALATION_SOLUTION | Freq: Once | RESPIRATORY_TRACT | Status: AC
  Administered 2019-01-08: 2.5 mg via RESPIRATORY_TRACT
  Filled 2019-01-08: qty 3

## 2019-01-08 MED ORDER — NALOXONE HCL 2 MG/2ML IJ SOSY
1.0000 mg | PREFILLED_SYRINGE | Freq: Once | INTRAMUSCULAR | Status: AC
  Administered 2019-01-08: 1 mg via INTRAVENOUS

## 2019-01-08 MED ORDER — FUROSEMIDE 10 MG/ML IJ SOLN
40.0000 mg | Freq: Once | INTRAMUSCULAR | Status: AC
  Administered 2019-01-09: 40 mg via INTRAVENOUS
  Filled 2019-01-08: qty 4

## 2019-01-08 MED ORDER — NALOXONE HCL 2 MG/2ML IJ SOSY
PREFILLED_SYRINGE | INTRAMUSCULAR | Status: AC
  Filled 2019-01-08: qty 2

## 2019-01-08 MED ORDER — DILTIAZEM HCL ER COATED BEADS 240 MG PO CP24
240.0000 mg | ORAL_CAPSULE | Freq: Every day | ORAL | Status: DC
  Administered 2019-01-08 – 2019-01-10 (×3): 240 mg via ORAL
  Filled 2019-01-08 (×4): qty 1

## 2019-01-08 MED ORDER — IPRATROPIUM-ALBUTEROL 0.5-2.5 (3) MG/3ML IN SOLN: 3.0000 mL | RESPIRATORY_TRACT | Status: DC | PRN

## 2019-01-08 MED ORDER — ONDANSETRON HCL 4 MG/2ML IJ SOLN: 4.0000 mg | Freq: Four times a day (QID) | INTRAMUSCULAR | Status: DC | PRN

## 2019-01-08 MED ORDER — ONDANSETRON HCL 4 MG PO TABS: 4.0000 mg | ORAL_TABLET | Freq: Four times a day (QID) | ORAL | Status: DC | PRN

## 2019-01-08 MED ORDER — NALOXONE HCL 2 MG/2ML IJ SOSY
PREFILLED_SYRINGE | INTRAMUSCULAR | Status: AC
  Filled 2019-01-08: qty 4

## 2019-01-08 MED ORDER — FUROSEMIDE 10 MG/ML IJ SOLN
40.0000 mg | Freq: Once | INTRAMUSCULAR | Status: AC
  Administered 2019-01-08: 40 mg via INTRAVENOUS
  Filled 2019-01-08: qty 4

## 2019-01-08 MED ORDER — METHYLPREDNISOLONE SODIUM SUCC 125 MG IJ SOLR
125.0000 mg | Freq: Once | INTRAMUSCULAR | Status: AC
  Administered 2019-01-08: 125 mg via INTRAVENOUS
  Filled 2019-01-08: qty 2

## 2019-01-08 MED ORDER — ACETAMINOPHEN 325 MG PO TABS: 650.0000 mg | ORAL_TABLET | Freq: Four times a day (QID) | ORAL | Status: DC | PRN

## 2019-01-08 MED ORDER — VANCOMYCIN HCL IN DEXTROSE 1-5 GM/200ML-% IV SOLN
1000.0000 mg | Freq: Once | INTRAVENOUS | Status: AC
  Administered 2019-01-08: 1000 mg via INTRAVENOUS
  Filled 2019-01-08: qty 200

## 2019-01-08 MED ORDER — NALOXONE HCL 4 MG/10ML IJ SOLN
0.6700 mg/h | INTRAVENOUS | Status: DC
  Administered 2019-01-08 – 2019-01-09 (×3): 0.67 mg/h via INTRAVENOUS
  Filled 2019-01-08 (×3): qty 10

## 2019-01-08 NOTE — ED Notes (Signed)
RT at bedside for bipap.

## 2019-01-08 NOTE — ED Provider Notes (Signed)
[facility] EMERGENCY DEPARTMENT Provider Note   CSN: [id number] Arrival date & time: 01/08/19  1244    History   Chief Complaint No chief complaint on file.   HPI Mary [name] is a 68 y.o. female.     Patient is brought into the emergency department because she was lethargic and hypoxic.  She was found by a friend with her pulse ox around 60%.  Paramedics arrived and gave her some Narcan and she improved.  She had taken some narcotics today.  Patient has COPD and congestive heart failure  The history is provided by the patient. No language interpreter was used.  Shortness of Breath  Severity:  Moderate Onset quality:  Sudden Timing:  Constant Progression:  Worsening Chronicity:  Recurrent Context: not activity   Relieved by:  Nothing Ineffective treatments:  None tried Associated symptoms: no abdominal pain, no chest pain, no cough, no headaches and no rash   Risk factors: no recent alcohol use     Past Medical History:  Diagnosis Date  . Anxiety   . Arthritis   . Atrial fibrillation ([place])   . Bronchial asthma   . CHF (congestive heart failure) ([place])   . Chronic pain    Bacl pain, Disc L5-S1- Dr. Joya Salm in [place]  . COPD (chronic obstructive pulmonary disease) ([place])   . DDD (degenerative disc disease)    Radicular symptoms  . Diabetes mellitus   . History of stress test 06/2011   Abnormal myocardial perfusion study.  Marland Kitchen Hx of echocardiogram    Was interpreted by Dr Doylene Canard that showed an Ef in the 50-60% range with grade 1 diastolic dysfunction. she had moderate MR, biatrial enlargement, moderate TR and at that time estimated RV systolic pressure was 43 mm.  . Hyperlipidemia   . Hypertension   . Neuropathy   . Shortness of breath   . Tachycardia     Patient Active Problem List   Diagnosis Date Noted  . Respiratory failure ([place]) 11/05/2018  . Rib pain on right side 11/03/2018  . Atrial fibrillation with RVR ([place]) 04/06/2018  . Cellulitis in diabetic foot  ([place]) 04/06/2018  . Acute on chronic diastolic CHF (congestive heart failure) ([place]) 01/10/2018  . Sepsis ([place]) 01/08/2018  . AKI (acute kidney injury) ([place]) 01/08/2018  . Acute metabolic encephalopathy 81/19/1478  . Acute respiratory failure with hypoxia and hypercarbia ([place]) 01/08/2018  . Transaminasemia 01/08/2018  . Acute respiratory failure with hypercapnia ([place]) 01/07/2018  . Hyponatremia 01/07/2018  . Anemia 01/07/2018  . Lobar pneumonia ([place]) 11/16/2017  . Chronic diastolic CHF (congestive heart failure) ([place]) 11/16/2017  . Acute respiratory failure with hypoxia ([place]) 11/15/2017  . Malnutrition of moderate degree 02/20/2017  . Acute on chronic respiratory failure with hypoxia ([place]) 02/18/2017  . Hypomagnesemia 02/18/2017  . COPD with acute exacerbation ([place]) 11/04/2016  . Left carotid bruit 09/13/2015  . Noncompliance with medications 03/19/2015  . Gastroenteritis 03/19/2015  . Seasonal allergies 01/25/2015  . Chest pain 05/16/2014  . HTN (hypertension) 05/16/2014  . PSVT (paroxysmal supraventricular tachycardia) ([place]) 05/16/2014  . Acute diastolic CHF (congestive heart failure) ([place]) 05/15/2014  . CAD (coronary artery disease) 05/15/2014  . RBBB 04/25/2014  . Benign skin lesion 03/07/2014  . 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 Atrial fibrillation with rapid ventricular response ([place]) 01/06/2014  . Leukocytosis 01/06/2014  . Fall at home 09/18/2013  . Hypotension 09/18/2013  . Recurrent falls 09/18/2013  . Tinnitus of left ear 02/19/2013  . Altered mental status  12/06/2012  . Diastolic congestive heart failure ([place]) 09/10/2012  . Atrial fibrillation ([place]) 09/10/2012  . DDD (degenerative disc disease), lumbar 08/26/2012  . Atypical mole 04/14/2012  . Seborrheic keratosis 04/14/2012  . Depression 12/09/2011  . COPD (chronic obstructive pulmonary disease) ([place]) 06/27/2011  . Anxiety 06/27/2011  . Chronic pain 05/27/2011  . Diabetes mellitus ([place]) 05/26/2011  . Hyperlipidemia  05/26/2011    Past Surgical History:  Procedure Laterality Date  . ABDOMINAL HYSTERECTOMY     partial  . Breast cyst removed    . CARPAL TUNNEL RELEASE     x 2  . CORONARY ARTERY BYPASS GRAFT N/A 01/09/2014   Procedure: CORONARY ARTERY BYPASS GRAFTING (CABG) x 3 using endoscopically harvested right saphenous vein and left internal mammary artery and closure of left atrial appendage;  Surgeon: Ivin Poot, MD;  Location: [place];  Service: Open Heart Surgery;  Laterality: N/A;  patient has preop IA BP   . INTRAOPERATIVE TRANSESOPHAGEAL ECHOCARDIOGRAM N/A 01/09/2014   Procedure: INTRAOPERATIVE TRANSESOPHAGEAL ECHOCARDIOGRAM;  Surgeon: Ivin Poot, MD;  Location: [place];  Service: Open Heart Surgery;  Laterality: N/A;  . LEFT HEART CATHETERIZATION WITH CORONARY ANGIOGRAM N/A 01/07/2014   Procedure: LEFT HEART CATHETERIZATION WITH CORONARY ANGIOGRAM;  Surgeon: Lorretta Harp, MD;  Location: [facility] CATH LAB;  Service: Cardiovascular;  Laterality: N/A;  . TONSILLECTOMY    . TUBAL LIGATION       OB History    Gravida  1   Para  1   Term  1   Preterm      AB      Living  0     SAB      TAB      Ectopic      Multiple      Live Births               Home Medications    Prior to Admission medications   Medication Sig Start Date End Date Taking? Authorizing Provider  acetaminophen (TYLENOL) 500 MG tablet Take 500 mg by mouth every 6 (six) hours as needed for mild pain.    [provider]  albuterol (PROVENTIL HFA;VENTOLIN HFA) 108 (90 Base) MCG/ACT inhaler Inhale 2 puffs into the lungs every 6 (six) hours as needed for shortness of breath. 11/10/18   Orson Eva, MD  ALPRAZolam Duanne Moron) 1 MG tablet Take 1 tablet by mouth every 8 (eight) hours as needed. 12/09/18   [provider]  apixaban (ELIQUIS) 5 MG TABS tablet Take 1 tablet (5 mg total) by mouth 2 (two) times daily. 01/06/19   Lorretta Harp, MD  aspirin EC 81 MG tablet Take 81 mg by mouth daily.     [provider]  atorvastatin (LIPITOR) 40 MG tablet Take 1 tablet (40 mg total) by mouth daily. 01/06/19   Troy Sine, MD  budesonide-formoterol [facility]) 160-4.5 MCG/ACT inhaler Inhale 2 puffs into the lungs 2 (two) times daily. 11/10/18   Orson Eva, MD  diltiazem (CARDIZEM CD) 240 MG 24 hr capsule Take 1 capsule (240 mg total) by mouth daily. 01/06/19   Troy Sine, MD  DULoxetine (CYMBALTA) 60 MG capsule Take 1 capsule by mouth daily. 12/02/18   [provider]  furosemide (LASIX) 20 MG tablet Take 1 tablet (20 mg total) by mouth daily. 12/20/18   Troy Sine, MD  glipiZIDE (GLUCOTROL XL) 2.5 MG 24 hr tablet Take 2.5 mg by mouth daily.  02/28/16   [provider]  HYDROcodone-acetaminophen (NORCO/VICODIN) 5-325 MG tablet Take 1 tablet by mouth as needed. For pain 12/02/18   [provider]  Iron-FA-B Cmp-C-Biot-Probiotic (FUSION PLUS PO) Take 1 tablet by mouth daily.    [provider]  JANUVIA 100 MG tablet TAKE ONE TABLET BY MOUTH DAILY. 07/19/15   Alycia Rossetti, MD  metFORMIN (GLUCOPHAGE) 850 MG tablet Take 1 tablet by mouth 2 (two) times daily. 12/02/18   [provider]  metoprolol tartrate (LOPRESSOR) 25 MG tablet Take 1 tablet (25 mg total) by mouth 2 (two) times daily. 12/20/18   Troy Sine, MD  montelukast (SINGULAIR) 10 MG tablet Take 1 tablet by mouth daily. 12/02/18   [provider]  pantoprazole (PROTONIX) 40 MG tablet Take 40 mg by mouth daily.    [provider]    Family History Family History  Problem Relation Age of Onset  . Diabetes Mother   . Heart disease Mother   . Diabetes Sister   . Hypertension Sister   . Hyperlipidemia Sister   . Diabetes Brother   . Heart disease Brother   . Diabetes Brother   . Heart disease Brother     Social History Social History   Tobacco Use  . Smoking status: Former Smoker    Packs/day: 1.50    Years: 30.00    Pack years: 45.00    Types:  Cigarettes    Last attempt to quit: 05/26/2013    Years since quitting: 5.6  . Smokeless tobacco: Never Used  . Tobacco comment: smells of heavy smoke 02/18/17  Substance Use Topics  . Alcohol use: No  . Drug use: No     Allergies   Adhesive [tape]; Latex; and Zocor [simvastatin - high dose]   Review of Systems Review of Systems  Constitutional: Negative for appetite change and fatigue.  HENT: Negative for congestion, ear discharge and sinus pressure.   Eyes: Negative for discharge.  Respiratory: Positive for shortness of breath. Negative for cough.   Cardiovascular: Negative for chest pain.  Gastrointestinal: Negative for abdominal pain and diarrhea.  Genitourinary: Negative for frequency and hematuria.  Musculoskeletal: Negative for back pain.  Skin: Negative for rash.  Neurological: Negative for seizures and headaches.  Psychiatric/Behavioral: Negative for hallucinations.     Physical Exam Updated Vital Signs BP 140/75   Pulse (!) 101   Resp 14   Ht 5\' 7"  (1.702 m)   Wt 50.8 kg   SpO2 94%   BMI 17.54 kg/m   Physical Exam Vitals signs and nursing note reviewed.  Constitutional:      Appearance: She is well-developed.  HENT:     Head: Normocephalic.     Nose: Nose normal.  Eyes:     General: No scleral icterus.    Conjunctiva/sclera: Conjunctivae normal.  Neck:     Musculoskeletal: Neck supple.     Thyroid: No thyromegaly.  Cardiovascular:     Rate and Rhythm: Normal rate and regular rhythm.     Heart sounds: No murmur. No friction rub. No gallop.   Pulmonary:     Breath sounds: No stridor. Rales present. No wheezing.  Chest:     Chest wall: No tenderness.  Abdominal:     General: There is no distension.     Tenderness: There is no abdominal tenderness. There is no rebound.  Musculoskeletal: Normal range of motion.  Lymphadenopathy:     Cervical: No cervical adenopathy.  Skin:    Findings: No erythema or rash.  Neurological:     Mental Status: She  is oriented to person, place, and time.     Motor: No abnormal muscle tone.     Coordination: Coordination normal.  Psychiatric:        Behavior: Behavior normal.      ED Treatments / Results  Labs (all labs ordered are listed, but only abnormal results are displayed) Labs Reviewed  COMPREHENSIVE METABOLIC PANEL - Abnormal; Notable for the following components:      Result Value   Glucose, Bld 208 (*)    All other components within normal limits  CBC WITH DIFFERENTIAL/PLATELET - Abnormal; Notable for the following components:   WBC 13.0 (*)    RBC 3.03 (*)    Hemoglobin 8.8 (*)    HCT 29.4 (*)    MCHC 29.9 (*)    RDW 17.0 (*)    Neutro Abs 11.0 (*)    Abs Immature Granulocytes 0.10 (*)    All other components within normal limits  BRAIN NATRIURETIC PEPTIDE - Abnormal; Notable for the following components:   B Natriuretic Peptide 357.0 (*)    All other components within normal limits  CULTURE, BLOOD (ROUTINE X 2)  CULTURE, BLOOD (ROUTINE X 2)  LACTIC ACID, PLASMA  TROPONIN I  INFLUENZA PANEL BY PCR (TYPE A & B)  URINALYSIS, ROUTINE W REFLEX MICROSCOPIC  BLOOD GAS, ARTERIAL    EKG EKG Interpretation  Date/Time:  Sunday January 08 2019 12:57:47 EDT Ventricular Rate:  99 PR Interval:    QRS Duration: 141 QT Interval:  395 QTC Calculation: 515 R Axis:   98 Text Interpretation:  Sinus tachycardia Atrial premature complex Right bundle branch block Borderline ST depression, lateral leads Confirmed by Milton Ferguson [phone]) on 01/08/2019 2:03:37 PM   Radiology Dg Chest Portable 1 View  Result Date: 01/08/2019 CLINICAL DATA:  Unresponsive. EXAM: PORTABLE CHEST 1 VIEW COMPARISON:  11/24/2018 FINDINGS: The heart is mildly enlarged. Moderate tortuosity and calcification of the thoracic aorta. 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 Underlying emphysematous changes and pulmonary scarring. There is interstitial and alveolar  pulmonary edema, a small right effusion and bibasilar atelectasis. No pneumothorax. The bony thorax is intact. IMPRESSION: 1. Emphysematous changes and pulmonary scarring. 2. Stable mild cardiac enlargement. 3. Pulmonary edema, small right pleural effusion and bibasilar atelectasis. Electronically Signed   By: Marijo Sanes M.D.   On: 01/08/2019 13:43    Procedures Procedures (including critical care time)  Medications Ordered in ED Medications  diltiazem (CARDIZEM CD) 24 hr capsule 240 mg (240 mg Oral Given 01/08/19 1446)  naloxone (NARCAN) injection 1 mg (1 mg Intravenous Given 01/08/19 1300)  vancomycin (VANCOCIN) IVPB 1000 mg/200 mL premix (0 mg Intravenous Stopped 01/08/19 1506)  piperacillin-tazobactam (ZOSYN) IVPB 3.375 g (0 g Intravenous Stopped 01/08/19 1404)  ipratropium-albuterol (DUONEB) 0.5-2.5 (3) MG/3ML nebulizer solution 3 mL (3 mLs Nebulization Given 01/08/19 1305)  albuterol (PROVENTIL) (2.5 MG/3ML) 0.083% nebulizer solution 2.5 mg (2.5 mg Nebulization Given 01/08/19 1305)  magnesium sulfate IVPB 2 g 50 mL (0 g Intravenous Stopped 01/08/19 1426)  methylPREDNISolone sodium succinate (SOLU-MEDROL) 125 mg/2 mL injection 125 mg (125 mg Intravenous Given 01/08/19 1304)  furosemide (LASIX) injection 40 mg (40 mg Intravenous Given 01/08/19 1414)     Initial Impression / Assessment and Plan / ED Course  I have reviewed the triage vital signs and the nursing notes.  Pertinent labs & imaging results that were available during my care of the patient were  reviewed by me and considered in my medical decision making (see chart for details).    CRITICAL CARE Performed by: Milton Ferguson Total critical care time: 40 minutes Critical care time was exclusive of separately billable procedures and treating other patients. Critical care was necessary to treat or prevent imminent or life-threatening deterioration. Critical care was time spent personally by me on the following activities: development  of treatment plan with patient and/or surrogate as well as nursing, discussions with consultants, evaluation of patient's response to treatment, examination of patient, obtaining history from patient or surrogate, ordering and performing treatments and interventions, ordering and review of laboratory studies, ordering and review of radiographic studies, pulse oximetry and re-evaluation of patient's condition.      With congestive heart failure and COPD exacerbation.  Also hypoxia from narcotics. Patient will be admitted to medicine f Final Clinical Impressions(s) / ED Diagnoses   Final diagnoses:  Hypoxia    ED Discharge Orders    None       Milton Ferguson, MD 01/08/19 [phone]

## 2019-01-08 NOTE — Progress Notes (Signed)
Pharmacy Antibiotic Note  Mary [name] is a 68 y.o. female admitted on 01/08/2019 with sepsis.  Pharmacy has been consulted for Vancomycin and zosyn dosing.  Plan: Vancomycin 1000mg  loading dose given in ED, then 500mg  IV every 12 hours.  Goal trough 15-20 mcg/mL.  Zosyn 3.375gm IV q8h EID over 4 hours F/U cxs and clinical progress Monitor V/S, labs, and levels as indicated  Height: 5\' 7"  (170.2 cm) Weight: 121 lb 0.5 oz (54.9 kg) IBW/kg (Calculated) : 61.6  Temp (24hrs), Avg:93.3 F (34.1 C), Min:93.3 F (34.1 C), Max:93.3 F (34.1 C)  Recent Labs  Lab 01/08/19 1314 01/08/19 1331  WBC 13.0*  --   CREATININE 0.93  --   LATICACIDVEN  --  1.7    Estimated Creatinine Clearance: 49.5 mL/min (by C-G formula based on SCr of 0.93 mg/dL).    Allergies  Allergen Reactions  . Adhesive [Tape] Other (See Comments)    Tears skin off.   . Latex Other (See Comments)    In tape, tears skin off.  . Zocor [Simvastatin - High Dose] Nausea And Vomiting    Antimicrobials this admission: Vancomycin 3/15 >>  Zosyn 3/15 >>   Dose adjustments this admission: n/a  Microbiology results: 3/15 BCx: pending 3/15 Influenza is negative  MRSA PCR:   Thank you for allowing pharmacy to be a part of this patient's care.  Isac Sarna, BS [place], [place] Clinical Pharmacist Pager [phone] 01/08/2019 9:54 PM

## 2019-01-08 NOTE — ED Notes (Signed)
ED Provider at bedside. 

## 2019-01-08 NOTE — H&P (Addendum)
History and Physical    Mary [name] BZJ:[id number] DOB: 06-02-1951 DOA: 01/08/2019  PCP: Celene Squibb, MD   Patient coming from: Home  I have personally briefly reviewed patient's old medical records in [place]  Chief Complaint: Unconsciousness  HPI: Mary [name] is a 67 y.o. female with medical history significant for  COPD on chronic 3 L O2, Diastolic CHF, DM, atrial fibrillation, CAD with CABG, anxiety and depression who was brought to the ED by her friend unconscious.  Time of my evaluation patient is sleepy but easily arousable, cooperates with exam, answer simple yes/no questions, but cant give details of history or events of today.  History is obtained from chart review and ED providers report.  Patient told staff earlier that she thinks she took 1/2  hydrocodone and 1/2 Xanax.  Patient had just been started on hydrocodone.  On EMS arrival patient's respiratory rate was 4 bpm, and agonal, O2 sat 60% on room air, with CO2 of 56.  Patient was given 1 mg Narcan, patient became alerts, BP increased to 130/70, O2 sats increased to 91% on 4 L.   Patient denies difficulty breathing or cough, no abdominal pain, no burning with urination, no fevers or chills, no sore throat.  ED Course: Heart rate 95-107, blood pressure 118-1 40, O2 sats initially 63% patient placed on nonrebreather and switched to high flow nasal cannula, with sats > 93%.  At the time of my evaluation, respiratory rate up to 18.  WBC 13.  Lactic acid normal 1.7.  BNP 357.  Chest x-ray emphysema, pulmonary scarring with pulmonary edema and small right pleural effusion.  Blood cultures obtained, broad-spectrum antibiotics vancomycin and Zosyn started for possible sepsis.  Solu-Medrol 125 mg x 1 given.  IV Lasix 401 also given.  Hospitalist to admit for altered mental status.   Review of Systems: As per HPI all other systems reviewed and negative.  Past Medical History:  Diagnosis Date  . Anxiety   . Arthritis   . Atrial  fibrillation ([place])   . Bronchial asthma   . CHF (congestive heart failure) ([place])   . Chronic pain    Bacl pain, Disc L5-S1- Dr. Joya Salm in [place]  . COPD (chronic obstructive pulmonary disease) ([place])   . DDD (degenerative disc disease)    Radicular symptoms  . Diabetes mellitus   . History of stress test 06/2011   Abnormal myocardial perfusion study.  Marland Kitchen Hx of echocardiogram    Was interpreted by Dr Doylene Canard that showed an Ef in the 50-60% range with grade 1 diastolic dysfunction. she had moderate MR, biatrial enlargement, moderate TR and at that time estimated RV systolic pressure was 43 mm.  . Hyperlipidemia   . Hypertension   . Neuropathy   . Shortness of breath   . Tachycardia     Past Surgical History:  Procedure Laterality Date  . ABDOMINAL HYSTERECTOMY     partial  . Breast cyst removed    . CARPAL TUNNEL RELEASE     x 2  . CORONARY ARTERY BYPASS GRAFT N/A 01/09/2014   Procedure: CORONARY ARTERY BYPASS GRAFTING (CABG) x 3 using endoscopically harvested right saphenous vein and left internal mammary artery and closure of left atrial appendage;  Surgeon: Ivin Poot, MD;  Location: [facility];  Service: Open Heart Surgery;  Laterality: N/A;  patient has preop IA BP   . INTRAOPERATIVE TRANSESOPHAGEAL ECHOCARDIOGRAM N/A 01/09/2014   Procedure: INTRAOPERATIVE TRANSESOPHAGEAL ECHOCARDIOGRAM;  Surgeon: Tharon Aquas Trigt,  MD;  Location: [facility] OR;  Service: Open Heart Surgery;  Laterality: N/A;  . LEFT HEART CATHETERIZATION WITH CORONARY ANGIOGRAM N/A 01/07/2014   Procedure: LEFT HEART CATHETERIZATION WITH CORONARY ANGIOGRAM;  Surgeon: Lorretta Harp, MD;  Location[facility] CATH LAB;  Service: Cardiovascular;  Laterality: N/A;  . TONSILLECTOMY    . TUBAL LIGATION       reports that she quit smoking about 5 years ago. Her smoking use included cigarettes. She has a 45.00 pack-year smoking history. She has never used smokeless tobacco. She reports that she does not drink alcohol or use drugs.   Allergies  Allergen Reactions  . Adhesive [Tape] Other (See Comments)    Tears skin off.   . Latex Other (See Comments)    In tape, tears skin off.  . Zocor [Simvastatin - High Dose] Nausea And Vomiting    Family History  Problem Relation Age of Onset  . Diabetes Mother   . Heart disease Mother   . Diabetes Sister   . Hypertension Sister   . Hyperlipidemia Sister   . Diabetes Brother   . Heart disease Brother   . Diabetes Brother   . Heart disease Brother     Prior to Admission medications   Medication Sig Start Date End Date Taking? Authorizing Provider  acetaminophen (TYLENOL) 500 MG tablet Take 500 mg by mouth every 6 (six) hours as needed for mild pain.    [provider]  albuterol (PROVENTIL HFA;VENTOLIN HFA) 108 (90 Base) MCG/ACT inhaler Inhale 2 puffs into the lungs every 6 (six) hours as needed for shortness of breath. 11/10/18   Orson Eva, MD  ALPRAZolam Duanne Moron) 1 MG tablet Take 1 tablet by mouth every 8 (eight) hours as needed. 12/09/18   [provider]  apixaban (ELIQUIS) 5 MG TABS tablet Take 1 tablet (5 mg total) by mouth 2 (two) times daily. 01/06/19   Lorretta Harp, MD  aspirin EC 81 MG tablet Take 81 mg by mouth daily.    [provider]  atorvastatin (LIPITOR) 40 MG tablet Take 1 tablet (40 mg total) by mouth daily. 01/06/19   Troy Sine, MD  budesonide-formoterol [facility]) 160-4.5 MCG/ACT inhaler Inhale 2 puffs into the lungs 2 (two) times daily. 11/10/18   Orson Eva, MD  diltiazem (CARDIZEM CD) 240 MG 24 hr capsule Take 1 capsule (240 mg total) by mouth daily. 01/06/19   Troy Sine, MD  DULoxetine (CYMBALTA) 60 MG capsule Take 1 capsule by mouth daily. 12/02/18   [provider]  furosemide (LASIX) 20 MG tablet Take 1 tablet (20 mg total) by mouth daily. 12/20/18   Troy Sine, MD  glipiZIDE (GLUCOTROL XL) 2.5 MG 24 hr tablet Take 2.5 mg by mouth daily.  02/28/16   [provider]   HYDROcodone-acetaminophen (NORCO/VICODIN) 5-325 MG tablet Take 1 tablet by mouth as needed. For pain 12/02/18   [provider]  Iron-FA-B Cmp-C-Biot-Probiotic (FUSION PLUS PO) Take 1 tablet by mouth daily.    [provider]  JANUVIA 100 MG tablet TAKE ONE TABLET BY MOUTH DAILY. 07/19/15   Alycia Rossetti, MD  metFORMIN (GLUCOPHAGE) 850 MG tablet Take 1 tablet by mouth 2 (two) times daily. 12/02/18   [provider]  metoprolol tartrate (LOPRESSOR) 25 MG tablet Take 1 tablet (25 mg total) by mouth 2 (two) times daily. 12/20/18   Troy Sine, MD  montelukast (SINGULAIR) 10 MG tablet Take 1 tablet by mouth daily. 12/02/18   [provider]  pantoprazole (PROTONIX) 40 MG tablet Take 40 mg by mouth daily.    [provider]    Physical Exam: Vitals:   01/08/19 1430 01/08/19 1446 01/08/19 1500 01/08/19 1530  BP: 137/69 137/69 134/73 140/75  Pulse: 100  95 (!) 101  Resp: 13  12 14   SpO2: 95%  93% 94%  Weight:      Height:        Constitutional: Sleepy, but arousable to voice but lethargic. Vitals:   01/08/19 1430 01/08/19 1446 01/08/19 1500 01/08/19 1530  BP: 137/69 137/69 134/73 140/75  Pulse: 100  95 (!) 101  Resp: 13  12 14   SpO2: 95%  93% 94%  Weight:      Height:       Eyes: PERRL, lids and conjunctivae normal ENMT: Mucous membranes are moist. Posterior pharynx clear of any exudate or lesions.Normal dentition.  Neck: normal, supple, no masses, no thyromegaly Respiratory: clear to auscultation bilaterally, no wheezing, no crackles. Normal respiratory effort. No accessory muscle use.  Cardiovascular: Regular rate and rhythm, no murmurs / rubs / gallops. No extremity edema. 2+ pedal pulses. No carotid bruits.  Abdomen: no tenderness, no masses palpated. No hepatosplenomegaly. Bowel sounds positive.  Musculoskeletal: no clubbing / cyanosis. No joint deformity upper and lower extremities. Good ROM, no contractures. Normal muscle tone.  Skin:  no rashes, lesions, ulcers. No induration Neurologic: Unable to fully examine, no obvious cranial nerve deficits, vocalizing, at least 4+/5 strength in all extremities. Psychiatric: Unable to fully examine, sleepy but arouses to voice.   Labs on Admission: I have personally reviewed following labs and imaging studies  CBC: Recent Labs  Lab 01/08/19 1314  WBC 13.0*  NEUTROABS 11.0*  HGB 8.8*  HCT 29.4*  MCV 97.0  PLT 540   Basic Metabolic Panel: Recent Labs  Lab 01/08/19 1314  NA 136  K 4.7  CL 98  CO2 28  GLUCOSE 208*  BUN 16  CREATININE 0.93  CALCIUM 9.1   Liver Function Tests: Recent Labs  Lab 01/08/19 1314  AST 23  ALT 14  ALKPHOS 67  BILITOT 0.7  PROT 7.4  ALBUMIN 3.8   Cardiac Enzymes: Recent Labs  Lab 01/08/19 1314  TROPONINI <0.03    Radiological Exams on Admission: Dg Chest Portable 1 View  Result Date: 01/08/2019 CLINICAL DATA:  Unresponsive. EXAM: PORTABLE CHEST 1 VIEW COMPARISON:  11/24/2018 FINDINGS: The heart is mildly enlarged. Moderate tortuosity and calcification of the thoracic aorta. 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 Underlying emphysematous changes and pulmonary scarring. There is interstitial and alveolar pulmonary edema, a small right effusion and bibasilar atelectasis. No pneumothorax. The bony thorax is intact. IMPRESSION: 1. Emphysematous changes and pulmonary scarring. 2. Stable mild cardiac enlargement. 3. Pulmonary edema, small right pleural effusion and bibasilar atelectasis. Electronically Signed   By: Marijo Sanes M.D.   On: 01/08/2019 13:43    EKG: Independently reviewed.  Sinus rhythm, rate 99.  Atrial premature complex.  Old right bundle branch block.  ST depression V5 V6, this is new.  Assessment/Plan Principal Problem:   Acute metabolic encephalopathy   Acute Metabolic encephalopathy-likely from combination of benzodiazepine and narcotics, also with Co2  narcosis. Reported improvement in respiratory status and mentation after 1 mg of Narcan given by EMS, also some improvement in metation with repeat of with 1 mg Narcan.  Respiratory rate now 18.  Mild leukocytosis 13.  Normal lactic acid. -With Improvement in  respiratory status will hold off on further Narcan administration. -Obtain stat Head CT-negative for acute abnormality -Follow-up blood cultures -Follow-up UA -Obtain UDS -Hold home Xanax and Norco - CBC a.m -Addendum called that patient's temperature was 93.  Continue IV antibiotics Vanco and Zosyn at this time for possible infectious etiology.  Warming blanket/Bair hugger ordered. -Also patient's became obtunded again not responding to sternal rub, IV Narcan 1mg  given with improvement in respiratory status, increase tidal volumes and respiratory rate and response to sternal rub.  Hence Narcan drip started.  Acute hypoxic and hypercapnic respiratory failure-likely combination of narcotic and benzos, pulmonary edema.  No wheezing on exam. - Obtain ABG-pH 7.0, PCO2 118, PO2- 88. - BIPAP -Respiratory protocol -repeat IV Lasix 40mg  in a.m  Diastolic CHF-x-ray shows pulmonary edema, small right pleural effusion.  No obvious sign of peripheral volume overload.  Per charts weights are about baseline/ slightly lower than baseline.  BNP 357, slight increase from recent hospitalization for decompensated CHF.  Lasix 40 mg x 1 given in ED -Repeat IV Lasix 40 daily in a.m for now, further IV or p.o. Lasix dosing pending clinical course -Strict input output, daily weights -BMP a.m.  COPD- Appears stable. On chronic 3L O2.  - Cont home bronchodilators - Donebs  Anxiety, depression-will need to confirm dose of medication narcotic and benzo that patient took, if overdose, and if intentional versus accidental. -Hold home narcotics and benzos at this time continue home continue home duloxetine  DM- Glucose random 208. - SSI for now -Hold Januvia  and Glipizide  Atria fibrillation-rate controlled, and anticoagulated -Continue home Eliquis, diltiazem and metoprolol  CAD-history of CABG.  Denies chest pain.  EKG with ST depressions lateral leads- new, old RBBB. -Continue home aspirin, Lipitor   DVT prophylaxis: Eliquis Code Status: Full for now, unable to confirm, prior documentation states DNR Family Communication: None at bedside Disposition Plan: Per rounding team Consults called: None Admission status: Obs, Stepdown  Bethena Roys MD [facility]  01/08/2019, 4:03 PM

## 2019-01-08 NOTE — ED Triage Notes (Signed)
EMS reports pt's friend called ems because she was unresponsive when she came to visit her today.   EMS reports pt's rr was 4 bpm and agonal.  02 sat 60% on room air and co2 56.  REports pt recently started taking hydrocodone.  EMS started IV and gave 1mg  narcan.  PT became alert, bp 130/70, hr 104, o2 sat increased to 91% on 4 liters.  EMS says pt on home o2 at 3liters.  PT presently alert and oriented.  Pt told staff she took 1/2 hydrocodone and 1/2 of xanax today she thinks.

## 2019-01-08 NOTE — ED Notes (Signed)
Phlebotomy at bedside. Will start antibiotics after second set of blood cultures is drawn.

## 2019-01-08 NOTE — ED Notes (Signed)
Verified with EDP, not IV fluids needed at this time. Pt hx of COPD and CHF.

## 2019-01-09 ENCOUNTER — Telehealth: Payer: Self-pay | Admitting: Cardiovascular Disease

## 2019-01-09 DIAGNOSIS — T50904A Poisoning by unspecified drugs, medicaments and biological substances, undetermined, initial encounter: Secondary | ICD-10-CM | POA: Diagnosis present

## 2019-01-09 DIAGNOSIS — E1151 Type 2 diabetes mellitus with diabetic peripheral angiopathy without gangrene: Secondary | ICD-10-CM | POA: Diagnosis present

## 2019-01-09 DIAGNOSIS — J449 Chronic obstructive pulmonary disease, unspecified: Secondary | ICD-10-CM | POA: Diagnosis present

## 2019-01-09 DIAGNOSIS — I2510 Atherosclerotic heart disease of native coronary artery without angina pectoris: Secondary | ICD-10-CM | POA: Diagnosis present

## 2019-01-09 DIAGNOSIS — I4821 Permanent atrial fibrillation: Secondary | ICD-10-CM

## 2019-01-09 DIAGNOSIS — Z87891 Personal history of nicotine dependence: Secondary | ICD-10-CM | POA: Diagnosis not present

## 2019-01-09 DIAGNOSIS — G92 Toxic encephalopathy: Secondary | ICD-10-CM | POA: Diagnosis present

## 2019-01-09 DIAGNOSIS — I5033 Acute on chronic diastolic (congestive) heart failure: Secondary | ICD-10-CM

## 2019-01-09 DIAGNOSIS — Z7951 Long term (current) use of inhaled steroids: Secondary | ICD-10-CM | POA: Diagnosis not present

## 2019-01-09 DIAGNOSIS — Z951 Presence of aortocoronary bypass graft: Secondary | ICD-10-CM | POA: Diagnosis not present

## 2019-01-09 DIAGNOSIS — J9621 Acute and chronic respiratory failure with hypoxia: Secondary | ICD-10-CM

## 2019-01-09 DIAGNOSIS — D649 Anemia, unspecified: Secondary | ICD-10-CM

## 2019-01-09 DIAGNOSIS — J9622 Acute and chronic respiratory failure with hypercapnia: Secondary | ICD-10-CM | POA: Diagnosis present

## 2019-01-09 DIAGNOSIS — R4182 Altered mental status, unspecified: Secondary | ICD-10-CM | POA: Diagnosis present

## 2019-01-09 DIAGNOSIS — I4891 Unspecified atrial fibrillation: Secondary | ICD-10-CM | POA: Diagnosis present

## 2019-01-09 DIAGNOSIS — I110 Hypertensive heart disease with heart failure: Secondary | ICD-10-CM | POA: Diagnosis present

## 2019-01-09 DIAGNOSIS — Z8249 Family history of ischemic heart disease and other diseases of the circulatory system: Secondary | ICD-10-CM | POA: Diagnosis not present

## 2019-01-09 DIAGNOSIS — Z7982 Long term (current) use of aspirin: Secondary | ICD-10-CM | POA: Diagnosis not present

## 2019-01-09 DIAGNOSIS — Z7984 Long term (current) use of oral hypoglycemic drugs: Secondary | ICD-10-CM | POA: Diagnosis not present

## 2019-01-09 DIAGNOSIS — Z8349 Family history of other endocrine, nutritional and metabolic diseases: Secondary | ICD-10-CM | POA: Diagnosis not present

## 2019-01-09 DIAGNOSIS — G8929 Other chronic pain: Secondary | ICD-10-CM | POA: Diagnosis present

## 2019-01-09 DIAGNOSIS — I4510 Unspecified right bundle-branch block: Secondary | ICD-10-CM | POA: Diagnosis present

## 2019-01-09 DIAGNOSIS — E872 Acidosis: Secondary | ICD-10-CM | POA: Diagnosis present

## 2019-01-09 DIAGNOSIS — E785 Hyperlipidemia, unspecified: Secondary | ICD-10-CM | POA: Diagnosis present

## 2019-01-09 DIAGNOSIS — Z23 Encounter for immunization: Secondary | ICD-10-CM | POA: Diagnosis not present

## 2019-01-09 DIAGNOSIS — T424X1A Poisoning by benzodiazepines, accidental (unintentional), initial encounter: Secondary | ICD-10-CM | POA: Diagnosis present

## 2019-01-09 DIAGNOSIS — G9341 Metabolic encephalopathy: Secondary | ICD-10-CM | POA: Diagnosis not present

## 2019-01-09 DIAGNOSIS — R0602 Shortness of breath: Secondary | ICD-10-CM | POA: Diagnosis present

## 2019-01-09 DIAGNOSIS — Z833 Family history of diabetes mellitus: Secondary | ICD-10-CM | POA: Diagnosis not present

## 2019-01-09 DIAGNOSIS — E1140 Type 2 diabetes mellitus with diabetic neuropathy, unspecified: Secondary | ICD-10-CM | POA: Diagnosis present

## 2019-01-09 LAB — CBC
HCT: 27.3 % — ABNORMAL LOW (ref 36.0–46.0)
Hemoglobin: 8.1 g/dL — ABNORMAL LOW (ref 12.0–15.0)
MCH: 28.7 pg (ref 26.0–34.0)
MCHC: 29.7 g/dL — AB (ref 30.0–36.0)
MCV: 96.8 fL (ref 80.0–100.0)
Platelets: 188 10*3/uL (ref 150–400)
RBC: 2.82 MIL/uL — ABNORMAL LOW (ref 3.87–5.11)
RDW: 16.4 % — ABNORMAL HIGH (ref 11.5–15.5)
WBC: 10.2 10*3/uL (ref 4.0–10.5)
nRBC: 0 % (ref 0.0–0.2)

## 2019-01-09 LAB — BASIC METABOLIC PANEL
Anion gap: 7 (ref 5–15)
BUN: 21 mg/dL (ref 8–23)
CO2: 32 mmol/L (ref 22–32)
Calcium: 9.1 mg/dL (ref 8.9–10.3)
Chloride: 98 mmol/L (ref 98–111)
Creatinine, Ser: 1.13 mg/dL — ABNORMAL HIGH (ref 0.44–1.00)
GFR calc Af Amer: 57 mL/min — ABNORMAL LOW (ref >60)
GFR calc non Af Amer: 50 mL/min — ABNORMAL LOW (ref >60)
Glucose, Bld: 274 mg/dL — ABNORMAL HIGH (ref 70–99)
Potassium: 4.3 mmol/L (ref 3.5–5.1)
Sodium: 137 mmol/L (ref 135–145)

## 2019-01-09 LAB — BLOOD GAS, ARTERIAL
Acid-Base Excess: 7.2 mmol/L — ABNORMAL HIGH (ref 0.0–2.0)
Bicarbonate: 30.1 mmol/L — ABNORMAL HIGH (ref 20.0–28.0)
FIO2: 45
O2 SAT: 93.1 %
PATIENT TEMPERATURE: 37
pCO2 arterial: 71.9 mmHg (ref 32.0–48.0)
pH, Arterial: 7.29 — ABNORMAL LOW (ref 7.350–7.450)
pO2, Arterial: 73.8 mmHg — ABNORMAL LOW (ref 83.0–108.0)

## 2019-01-09 LAB — GLUCOSE, CAPILLARY
Glucose-Capillary: 228 mg/dL — ABNORMAL HIGH (ref 70–99)
Glucose-Capillary: 217 mg/dL — ABNORMAL HIGH (ref 70–99)
Glucose-Capillary: 163 mg/dL — ABNORMAL HIGH (ref 70–99)
Glucose-Capillary: 264 mg/dL — ABNORMAL HIGH (ref 70–99)

## 2019-01-09 MED ORDER — ATORVASTATIN CALCIUM 40 MG PO TABS
40.0000 mg | ORAL_TABLET | Freq: Every day | ORAL | Status: DC
  Administered 2019-01-09: 40 mg via ORAL
  Filled 2019-01-09: qty 1

## 2019-01-09 MED ORDER — MONTELUKAST SODIUM 10 MG PO TABS
10.0000 mg | ORAL_TABLET | Freq: Every day | ORAL | Status: DC
  Administered 2019-01-10: 10 mg via ORAL
  Filled 2019-01-09: qty 1

## 2019-01-09 MED ORDER — NALOXONE HCL 2 MG/2ML IJ SOSY
PREFILLED_SYRINGE | INTRAMUSCULAR | Status: AC
  Filled 2019-01-09: qty 4

## 2019-01-09 MED ORDER — METOPROLOL TARTRATE 25 MG PO TABS
25.0000 mg | ORAL_TABLET | Freq: Two times a day (BID) | ORAL | Status: DC
  Administered 2019-01-09 – 2019-01-10 (×2): 25 mg via ORAL
  Filled 2019-01-09 (×2): qty 1

## 2019-01-09 MED ORDER — DULOXETINE HCL 60 MG PO CPEP
60.0000 mg | ORAL_CAPSULE | Freq: Every day | ORAL | Status: DC
  Administered 2019-01-10: 60 mg via ORAL
  Filled 2019-01-09: qty 1

## 2019-01-09 MED ORDER — FUROSEMIDE 20 MG PO TABS
20.0000 mg | ORAL_TABLET | Freq: Every day | ORAL | Status: DC
  Administered 2019-01-10: 20 mg via ORAL
  Filled 2019-01-09: qty 1

## 2019-01-09 MED ORDER — PANTOPRAZOLE SODIUM 40 MG PO TBEC
40.0000 mg | DELAYED_RELEASE_TABLET | Freq: Every day | ORAL | Status: DC
  Administered 2019-01-09 – 2019-01-10 (×2): 40 mg via ORAL
  Filled 2019-01-09 (×2): qty 1

## 2019-01-09 MED ORDER — APIXABAN 5 MG PO TABS
5.0000 mg | ORAL_TABLET | Freq: Two times a day (BID) | ORAL | Status: DC
  Administered 2019-01-09 – 2019-01-10 (×3): 5 mg via ORAL
  Filled 2019-01-09 (×3): qty 1

## 2019-01-09 MED ORDER — VANCOMYCIN HCL 500 MG IV SOLR
INTRAVENOUS | Status: AC
  Filled 2019-01-09: qty 500

## 2019-01-09 MED ORDER — ASPIRIN EC 81 MG PO TBEC
81.0000 mg | DELAYED_RELEASE_TABLET | Freq: Every day | ORAL | Status: DC
  Administered 2019-01-09 – 2019-01-10 (×2): 81 mg via ORAL
  Filled 2019-01-09 (×2): qty 1

## 2019-01-09 MED ORDER — MOMETASONE FURO-FORMOTEROL FUM 200-5 MCG/ACT IN AERO
2.0000 | INHALATION_SPRAY | Freq: Two times a day (BID) | RESPIRATORY_TRACT | Status: DC
  Administered 2019-01-09: 2 via RESPIRATORY_TRACT
  Filled 2019-01-09 (×2): qty 8.8

## 2019-01-09 NOTE — Telephone Encounter (Signed)
Pt's sister wanted to let Dr Claiborne Billings know pt is in hospital [facility]).Will forward to Dr Claiborne Billings .Adonis Housekeeper

## 2019-01-09 NOTE — Progress Notes (Signed)
Inpatient Diabetes Program Recommendations  AACE/ADA: New Consensus Statement on Inpatient Glycemic Control   Target Ranges:  Prepandial:   less than 140 mg/dL      Peak postprandial:   less than 180 mg/dL (1-2 hours)      Critically ill patients:  140 - 180 mg/dL  Results for TRENIECE, HOLSCLAW (MRN [id number]) as of 01/09/2019 07:37  Ref. Range 01/09/2019 04:15  Glucose Latest Ref Range: 70 - 99 mg/dL 274 (H)   Results for KADIATOU, OPLINGER (MRN [id number]) as of 01/09/2019 07:37  Ref. Range 01/08/2019 21:59  Glucose-Capillary Latest Ref Range: 70 - 99 mg/dL 279 (H)   Review of Glycemic Control  Diabetes history: DM2 Outpatient Diabetes medications: Januvia 25 mg daily, Glipizide XL 2.5 mg daily Current orders for Inpatient glycemic control: Novolog 0-9 units TID with meals  Inpatient Diabetes Program Recommendations:   Insulin - Basal: Noted patient received Solumedrol 125 mg x1 on 01/08/19 and fasting lab glucose is 274 mg/dl this morning. Please consider ordering one time Lantus 6 units x1 now (based on 55 kg x 0.1 units).  Correction (SSI): Please consider ordering Novolog 0-5 units QHS for bedtime correction.  Thanks, [name], RN, MSN, CDE Diabetes Coordinator Inpatient Diabetes Program [phone] (Team Pager from 8am to 5pm)

## 2019-01-09 NOTE — Progress Notes (Signed)
Patient slowly awakening still on narcan drip, appears to be improving.

## 2019-01-09 NOTE — Progress Notes (Signed)
Dr. Shanon Brow notified of critical PCO2 of 71.9

## 2019-01-09 NOTE — Progress Notes (Signed)
Patient appears to be slowly getting better, appears she had overdosed. Temp upon arrival to unit was 93 rectal, placed on narcan drip. This is not first time she has been on BiPAP. On warming blanket also. Lungs appear clear no wheezes. PCO2 appears to run in 50 to 60 Range. She will arouse but is slow to. Will continue to watch.

## 2019-01-09 NOTE — Progress Notes (Signed)
Patient off BiPAP on 6 lpm high flow. Awake able to talk , oriented to time and place , able to drink some water eat ice. No aspirations lungs decreased.

## 2019-01-09 NOTE — Telephone Encounter (Signed)
New Message   Patient's is admitted into the hospital and sister would like to talk to a nurse about the issue to see what Dr. Claiborne Billings can do.

## 2019-01-09 NOTE — Progress Notes (Addendum)
PROGRESS NOTE    Mary [name]  XIP:[id number] DOB: 04/02/1951 DOA: 01/08/2019 PCP: Celene Squibb, MD    Brief Narrative:  68 year old female with a history of atrial fibrillation, COPD on home oxygen, diastolic heart failure, admitted to the hospital with altered mental status.  She was found to be unconscious by her friend.  She is chronically on Xanax and reports recently being started on hydrocodone.  She was noted to have severe respiratory acidosis requiring BiPAP.  She was started on Narcan infusion with improvement of mental status.  Overall blood gases also improved.   Assessment & Plan:   Principal Problem:   Acute metabolic encephalopathy Active Problems:   Diabetes mellitus (HCC)   COPD (chronic obstructive pulmonary disease) (HCC)   Atrial fibrillation (HCC)   Acute on chronic respiratory failure with hypoxia (HCC)   Anemia   Acute on chronic diastolic CHF (congestive heart failure) (HCC)   AMS (altered mental status)   Overdose, undetermined intent, initial encounter   1. Acute metabolic encephalopathy.  Related to respiratory acidosis and hypercarbia as well as opiate medications.  Patient was started on BiPAP and sedative medications were held.  She was also started on a Narcan infusion.  Overall mental status is improving.  PCO2 and pH also improving.  She is now off BiPAP. 2. Narcotic overdose.  Being treated with Narcan infusion.  Unclear if this was accidental or intentional.  This will need to be further assessed once mental status allows. 3. Hypothermia.  Patient noted to have low temperature and there was concern for underlying infectious etiology.  She was started on broad-spectrum antibiotics.  Cultures are shown no growth.  Overall temperature has improved.  Anticipate continue antibiotics during the 24 hours and if no signs of infection, can likely discontinue antibiotics in a.m. 4. Acute on chronic respiratory failure with hypoxia and hypercapnia.  Initial pH  noted below 7.0 and PCO2 118.  Patient was started on BiPAP.  Overall pH and PCO2 have improved.  She is now off BiPAP and is on nasal cannula.  We will try and wean down to her home oxygen requirement. 5. Acute on chronic diastolic congestive heart failure.  Chest x-ray indicated some element of decompensated CHF.  She received a dose of IV Lasix in the emergency room.  Resume oral Lasix. 6. COPD.  No wheezing at this time.  Continue bronchodilators as needed.  Restart steroid inhalers. 7. Diabetes.  Continue on sliding scale insulin.  Blood sugars are stable. 8. Atrial fibrillation.  Currently rate controlled.  Anticoagulated with Eliquis.  Continue on diltiazem and metoprolol. 9. Chronic normocytic anemia.  Etiology is unclear.  No signs of bleeding.  Baseline hemoglobin appears to be between 9-10.  Continue to follow.   DVT prophylaxis: Eliquis Code Status: Full code Family Communication: No family present Disposition Plan: Discharge home once mental status is stabilized   Consultants:     Procedures:     Antimicrobials:   Vancomycin 3/15 >  Zosyn 3/15 >   Subjective: Patient knows she is in the hospital.  Not sure why she is in the hospital.  Objective: Vitals:   01/09/19 1900 01/09/19 1915 01/09/19 1930 01/09/19 1945  BP: 120/73 121/74 (!) 118/95 114/67  Pulse: (!) 109 (!) 106 (!) 113 (!) 106  Resp: 18 18 14 16   Temp:      TempSrc:      SpO2: 99% 100% 99% 98%  Weight:      Height:  Intake/Output Summary (Last 24 hours) at 01/09/2019 2035 Last data filed at 01/09/2019 1800 Gross per 24 hour  Intake 1215.34 ml  Output 600 ml  Net 615.34 ml   Filed Weights   01/08/19 1250 01/08/19 2139 01/09/19 0400  Weight: 50.8 kg 54.9 kg 55 kg    Examination:  General exam: Appears calm and comfortable  Respiratory system: Diminished breath sounds bilaterally. Respiratory effort normal. Cardiovascular system: S1 & S2 heard, RRR. No JVD, murmurs, rubs, gallops or  clicks.  1+ pedal edema. Gastrointestinal system: Abdomen is nondistended, soft and nontender. No organomegaly or masses felt. Normal bowel sounds heard. Central nervous system: No focal neurological deficits. Extremities: Symmetric 5 x 5 power. Skin: No rashes, lesions or ulcers Psychiatry: Somnolent, wakes up to voice and answers questions.  She is not entirely clear on her answers    Data Reviewed: I have personally reviewed following labs and imaging studies  CBC: Recent Labs  Lab 01/08/19 1314 01/09/19 0415  WBC 13.0* 10.2  NEUTROABS 11.0*  --   HGB 8.8* 8.1*  HCT 29.4* 27.3*  MCV 97.0 96.8  PLT 208 696   Basic Metabolic Panel: Recent Labs  Lab 01/08/19 1314 01/09/19 0415  NA 136 137  K 4.7 4.3  CL 98 98  CO2 28 32  GLUCOSE 208* 274*  BUN 16 21  CREATININE 0.93 1.13*  CALCIUM 9.1 9.1   GFR: Estimated Creatinine Clearance: 40.8 mL/min (A) (by C-G formula based on SCr of 1.13 mg/dL (H)). Liver Function Tests: Recent Labs  Lab 01/08/19 1314  AST 23  ALT 14  ALKPHOS 67  BILITOT 0.7  PROT 7.4  ALBUMIN 3.8   No results for input(s): LIPASE, AMYLASE in the last 168 hours. No results for input(s): AMMONIA in the last 168 hours. Coagulation Profile: No results for input(s): INR, PROTIME in the last 168 hours. Cardiac Enzymes: Recent Labs  Lab 01/08/19 1314 01/08/19 1619  TROPONINI <0.03 <0.03   BNP (last 3 results) No results for input(s): PROBNP in the last 8760 hours. HbA1C: No results for input(s): HGBA1C in the last 72 hours. CBG: Recent Labs  Lab 01/08/19 2159 01/09/19 0802 01/09/19 1153 01/09/19 1646  GLUCAP 279* 228* 217* 163*   Lipid Profile: No results for input(s): CHOL, HDL, LDLCALC, TRIG, CHOLHDL, LDLDIRECT in the last 72 hours. Thyroid Function Tests: No results for input(s): TSH, T4TOTAL, FREET4, T3FREE, THYROIDAB in the last 72 hours. Anemia Panel: No results for input(s): VITAMINB12, FOLATE, FERRITIN, TIBC, IRON, RETICCTPCT in  the last 72 hours. Sepsis Labs: Recent Labs  Lab 01/08/19 1331  LATICACIDVEN 1.7    Recent Results (from the past 240 hour(s))  Blood Culture (routine x 2)     Status: None (Preliminary result)   Collection Time: 01/08/19  1:14 PM  Result Value Ref Range Status   Specimen Description LEFT ANTECUBITAL  Final   Special Requests   Final    BOTTLES DRAWN AEROBIC AND ANAEROBIC Blood Culture adequate volume   Culture   Final    NO GROWTH < 24 HOURS Performed at [facility], [address] [place], [place] [address]    Report Status PENDING  Incomplete  Blood Culture (routine x 2)     Status: None (Preliminary result)   Collection Time: 01/08/19  1:32 PM  Result Value Ref Range Status   Specimen Description LEFT ANTECUBITAL  Final   Special Requests   Final    BOTTLES DRAWN AEROBIC AND ANAEROBIC Blood Culture results may not  be optimal due to an inadequate volume of blood received in culture bottles   Culture   Final    NO GROWTH < 24 HOURS Performed at [facility], [address] [place], [place] [address]    Report Status PENDING  Incomplete  MRSA PCR Screening     Status: None   Collection Time: 01/08/19  9:20 PM  Result Value Ref Range Status   MRSA by PCR NEGATIVE NEGATIVE Final    Comment:        The GeneXpert MRSA Assay (FDA approved for NASAL specimens only), is one component of a comprehensive MRSA colonization surveillance program. It is not intended to diagnose MRSA infection nor to guide or monitor treatment for MRSA infections. Performed at [facility], [address], [place], [place] [address]          Radiology Studies: Ct Head Wo Contrast  Result Date: 01/08/2019 CLINICAL DATA:  Unresponsive with questionable drug overdose EXAM: CT HEAD WITHOUT CONTRAST TECHNIQUE: Contiguous axial images were obtained from the base of the skull through the vertex without intravenous contrast. COMPARISON:  April 06, 2018 FINDINGS: Brain: The ventricles are normal in  size and configuration. There is a small cavum septum pellucidum, an anatomic variant. There is no intracranial mass, hemorrhage, extra-axial fluid collection, or midline shift. There is patchy small vessel disease in the centra semiovale bilaterally, stable. No acute infarct is demonstrable. Vascular: There is no appreciable hyperdense vessel. There is calcification in the distal vertebral arteries as well as in each carotid siphon. Skull: The bony calvarium appears intact. Sinuses/Orbits: Visualized paranasal sinuses are clear. Visualized orbits appear symmetric bilaterally. Other: There is mastoid air cell opacification in several inferior mastoid air cells on the right. Mastoids elsewhere are clear. IMPRESSION: Patchy periventricular small vessel disease, stable. No acute infarct evident. No mass or hemorrhage. There are foci of arterial vascular calcification. There is inferior right mastoid air cell disease. Electronically Signed   By: Lowella Grip III M.D.   On: 01/08/2019 17:14   Dg Chest Portable 1 View  Result Date: 01/08/2019 CLINICAL DATA:  Unresponsive. EXAM: PORTABLE CHEST 1 VIEW COMPARISON:  11/24/2018 FINDINGS: The heart is mildly enlarged. Moderate tortuosity and calcification of the thoracic aorta. 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 Underlying emphysematous changes and pulmonary scarring. There is interstitial and alveolar pulmonary edema, a small right effusion and bibasilar atelectasis. No pneumothorax. The bony thorax is intact. IMPRESSION: 1. Emphysematous changes and pulmonary scarring. 2. Stable mild cardiac enlargement. 3. Pulmonary edema, small right pleural effusion and bibasilar atelectasis. Electronically Signed   By: Marijo Sanes M.D.   On: 01/08/2019 13:43        Scheduled Meds: . apixaban  5 mg Oral BID  . aspirin EC  81 mg Oral Daily  . atorvastatin  40 mg Oral q1800  . diltiazem  240 mg Oral Daily  .  [START ON 01/10/2019] DULoxetine  60 mg Oral Daily  . [START ON 01/10/2019] furosemide  20 mg Oral Daily  . Influenza vac split quadrivalent PF  0.5 mL Intramuscular Tomorrow-1000  . insulin aspart  0-9 Units Subcutaneous TID WC  . metoprolol tartrate  25 mg Oral BID  . mometasone-formoterol  2 puff Inhalation BID  . [START ON 01/10/2019] montelukast  10 mg Oral Daily  . naLOXone (NARCAN)  injection  1 mg Intravenous Once  . pantoprazole  40 mg Oral Daily   Continuous Infusions: . sodium chloride Stopped (  01/09/19 1618)  . naLOXone [facility]) adult infusion for OVERDOSE Stopped (01/09/19 1829)  . piperacillin-tazobactam (ZOSYN)  IV 12.5 mL/hr at 01/09/19 1800  . vancomycin Stopped (01/09/19 1520)     LOS: 0 days    Time spent: 68mins   Kathie Dike, MD [facility]   If 7PM-7AM, please contact night-coverage [email or web address]  01/09/2019, 8:35 PM

## 2019-01-09 NOTE — TOC Initial Note (Signed)
Transition of Care [facility]) - Initial/Assessment Note    Patient Details  Name: Mary [name] MRN: [id number] Date of Birth: 1951/08/19  [facility]) CM/SW Contact:    Ihor Gully, LCSW Phone Number: 01/09/2019, 6:09 PM  Clinical Narrative:                 Patient is independent at baseline, drives "a little," and requires no assitance with ADLs. She has no barriers to seeing her PCP, Dr. Nevada Crane, and maintains her appointments.  Patient indicated that she has DME in the home consisting of a walker (used most ofen), cane, rollator, shower chair and a bedside commode (not currently used). She states that her main support is a friend, however she is currently sick. Patient reports that she has two granddaughters, however they have busy lives and families. Patient's two daughters are deceased.  She is on home o2. She has no history of [place] services but is open to them if needed at discharge.   Expected Discharge Plan: [place] Barriers to Discharge: No Barriers Identified   Patient Goals and CMS Choice Patient states their goals for this hospitalization and ongoing recovery are:: To get back to baseline and feel better  CMS Medicare.gov Compare Post Acute Care list provided to:: Patient Choice offered to / list presented to : Patient  Expected Discharge Plan and Services Expected Discharge Plan: [place] Discharge Planning Services: CM Consult   Living arrangements for the past 2 months: Single Family Home                          Prior Living Arrangements/Services Living arrangements for the past 2 months: Single Family Home Lives with:: Self Patient language and need for interpreter reviewed:: No Do you feel safe going back to the place where you live?: Yes      Need for Family Participation in Patient Care: No (Comment) Care giver support system in place?: No (comment) Current home services: DME Criminal Activity/Legal  Involvement Pertinent to Current Situation/Hospitalization: No - Comment as needed  Activities of Daily Living Home Assistive Devices/Equipment: CBG Meter ADL Screening (condition at time of admission) Patient's cognitive ability adequate to safely complete daily activities?: No Is the patient deaf or have difficulty hearing?: No Does the patient have difficulty seeing, even when wearing glasses/contacts?: No Does the patient have difficulty concentrating, remembering, or making decisions?: Yes Patient able to express need for assistance with ADLs?: No Does the patient have difficulty dressing or bathing?: Yes Independently performs ADLs?: No Communication: Independent Dressing (OT): Needs assistance Is this a change from baseline?: Change from baseline, expected to last <3days Grooming: Needs assistance Is this a change from baseline?: Change from baseline, expected to last <3 days Feeding: Needs assistance Is this a change from baseline?: Change from baseline, expected to last <3 days Bathing: Needs assistance Is this a change from baseline?: Change from baseline, expected to last <3 days Toileting: Needs assistance Is this a change from baseline?: Change from baseline, expected to last <3 days In/Out Bed: Needs assistance Is this a change from baseline?: Change from baseline, expected to last <3 days Walks in Home: Needs assistance Is this a change from baseline?: Change from baseline, expected to last <3 days Does the patient have difficulty walking or climbing stairs?: Yes Weakness of Legs: Both Weakness of Arms/Hands: Both  Permission Sought/Granted Permission sought to share information with : PCP  Permission granted to share info w AGENCY: Dr. Nevada Crane  Permission granted to share info w Relationship: PCP  Permission granted to share info w Contact Information: Dr. Nevada Crane  Emotional Assessment Appearance:: Appears stated age Attitude/Demeanor/Rapport: Gracious Affect  (typically observed): Accepting, Calm Orientation: : Oriented to Self, Oriented to Place, Oriented to  Time, Oriented to Situation Alcohol / Substance Use: Not Applicable Psych Involvement: No (comment)  Admission diagnosis:  Hypoxia [Y86.57] Acute metabolic encephalopathy [Q46.96] Patient Active Problem List   Diagnosis Date Noted  . AMS (altered mental status) 01/09/2019  . Respiratory failure ([place]) 11/05/2018  . Rib pain on right side 11/03/2018  . Atrial fibrillation with RVR ([place]) 04/06/2018  . Cellulitis in diabetic foot ([place]) 04/06/2018  . Acute on chronic diastolic CHF (congestive heart failure) ([place]) 01/10/2018  . Sepsis ([place]) 01/08/2018  . AKI (acute kidney injury) ([place]) 01/08/2018  . Acute metabolic encephalopathy 29/52/8413  . Acute respiratory failure with hypoxia and hypercarbia ([place]) 01/08/2018  . Transaminasemia 01/08/2018  . Acute respiratory failure with hypercapnia ([place]) 01/07/2018  . Hyponatremia 01/07/2018  . Anemia 01/07/2018  . Lobar pneumonia ([place]) 11/16/2017  . Chronic diastolic CHF (congestive heart failure) ([place]) 11/16/2017  . Acute respiratory failure with hypoxia ([place]) 11/15/2017  . Malnutrition of moderate degree 02/20/2017  . Acute on chronic respiratory failure with hypoxia ([place]) 02/18/2017  . Hypomagnesemia 02/18/2017  . COPD with acute exacerbation ([place]) 11/04/2016  . Left carotid bruit 09/13/2015  . Noncompliance with medications 03/19/2015  . Gastroenteritis 03/19/2015  . Seasonal allergies 01/25/2015  . Chest pain 05/16/2014  . HTN (hypertension) 05/16/2014  . PSVT (paroxysmal supraventricular tachycardia) ([place]) 05/16/2014  . Acute diastolic CHF (congestive heart failure) ([place]) 05/15/2014  . CAD (coronary artery disease) 05/15/2014  . RBBB 04/25/2014  . Benign skin lesion 03/07/2014  . 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 Atrial fibrillation with rapid ventricular response ([place]) 01/06/2014  . Leukocytosis 01/06/2014  . Fall at home 09/18/2013  .  Hypotension 09/18/2013  . Recurrent falls 09/18/2013  . Tinnitus of left ear 02/19/2013  . Altered mental status 12/06/2012  . Diastolic congestive heart failure ([place]) 09/10/2012  . Atrial fibrillation ([place]) 09/10/2012  . DDD (degenerative disc disease), lumbar 08/26/2012  . Atypical mole 04/14/2012  . Seborrheic keratosis 04/14/2012  . Depression 12/09/2011  . COPD (chronic obstructive pulmonary disease) ([place]) 06/27/2011  . Anxiety 06/27/2011  . Chronic pain 05/27/2011  . Diabetes mellitus ([place]) 05/26/2011  . Hyperlipidemia 05/26/2011   PCP:  Celene Squibb, MD Pharmacy:   [place], [place] - [address] [place] [address] Phone: [phone] Fax: [phone]     Social Determinants of Health ([place]) Interventions    Readmission Risk Interventions 30 Day Unplanned Readmission Risk Score     ED to Hosp-Admission (Current) from 01/08/2019 in [place]  30 Day Unplanned Readmission Risk Score (%)  25 Filed at 01/09/2019 1600     This score is the patient's risk of an unplanned readmission within 30 days of being discharged (0 -100%). The score is based on dignosis, age, lab data, medications, orders, and past utilization.   Low:  0-14.9   Medium: 15-21.9   High: 22-29.9   Extreme: 30 and above       Readmission Risk Prevention Plan 01/09/2019  Transportation Screening Complete  Some recent data might be hidden

## 2019-01-09 NOTE — Telephone Encounter (Signed)
Thank you for letting me know

## 2019-01-10 DIAGNOSIS — Z23 Encounter for immunization: Secondary | ICD-10-CM | POA: Diagnosis not present

## 2019-01-10 DIAGNOSIS — E1159 Type 2 diabetes mellitus with other circulatory complications: Secondary | ICD-10-CM

## 2019-01-10 DIAGNOSIS — T50904A Poisoning by unspecified drugs, medicaments and biological substances, undetermined, initial encounter: Secondary | ICD-10-CM

## 2019-01-10 LAB — BASIC METABOLIC PANEL
Anion gap: 7 (ref 5–15)
BUN: 20 mg/dL (ref 8–23)
CHLORIDE: 96 mmol/L — AB (ref 98–111)
CO2: 36 mmol/L — ABNORMAL HIGH (ref 22–32)
Calcium: 9.4 mg/dL (ref 8.9–10.3)
Creatinine, Ser: 0.75 mg/dL (ref 0.44–1.00)
GFR calc Af Amer: >60 mL/min (ref >60)
GFR calc non Af Amer: >60 mL/min (ref >60)
Glucose, Bld: 164 mg/dL — ABNORMAL HIGH (ref 70–99)
Potassium: 3.9 mmol/L (ref 3.5–5.1)
SODIUM: 139 mmol/L (ref 135–145)

## 2019-01-10 LAB — CBC
HCT: 24 % — ABNORMAL LOW (ref 36.0–46.0)
HEMOGLOBIN: 7.6 g/dL — AB (ref 12.0–15.0)
MCH: 29.3 pg (ref 26.0–34.0)
MCHC: 31.7 g/dL (ref 30.0–36.0)
MCV: 92.7 fL (ref 80.0–100.0)
Platelets: 194 10*3/uL (ref 150–400)
RBC: 2.59 MIL/uL — ABNORMAL LOW (ref 3.87–5.11)
RDW: 16.9 % — ABNORMAL HIGH (ref 11.5–15.5)
WBC: 11.5 10*3/uL — ABNORMAL HIGH (ref 4.0–10.5)
nRBC: 0 % (ref 0.0–0.2)

## 2019-01-10 LAB — BLOOD GAS, ARTERIAL
Acid-Base Excess: 13.2 mmol/L — ABNORMAL HIGH (ref 0.0–2.0)
Bicarbonate: 36.2 mmol/L — ABNORMAL HIGH (ref 20.0–28.0)
Drawn by: 21310
FIO2: 36
O2 Saturation: 91.8 %
Patient temperature: 37
pCO2 arterial: 62.2 mmHg — ABNORMAL HIGH (ref 32.0–48.0)
pH, Arterial: 7.408 (ref 7.350–7.450)
pO2, Arterial: 66.1 mmHg — ABNORMAL LOW (ref 83.0–108.0)

## 2019-01-10 LAB — HIV ANTIBODY (ROUTINE TESTING W REFLEX): HIV Screen 4th Generation wRfx: NONREACTIVE

## 2019-01-10 LAB — GLUCOSE, CAPILLARY
GLUCOSE-CAPILLARY: 156 mg/dL — AB (ref 70–99)
Glucose-Capillary: 222 mg/dL — ABNORMAL HIGH (ref 70–99)
Glucose-Capillary: 93 mg/dL (ref 70–99)

## 2019-01-10 MED ORDER — CHLORHEXIDINE GLUCONATE CLOTH 2 % EX PADS: 6.0000 | MEDICATED_PAD | Freq: Every day | CUTANEOUS | Status: DC

## 2019-01-10 NOTE — Evaluation (Addendum)
Physical Therapy Evaluation Patient Details Name: Mary [name] MRN: [id number] DOB: 1951/08/03 Today's Date: 01/10/2019   History of Present Illness  Mary [name] is a 68 y.o. female with medical history significant for  COPD on chronic 3 L O2, Diastolic CHF, DM, atrial fibrillation, CAD with CABG, anxiety and depression who was brought to the ED by her friend unconscious.  Time of my evaluation patient is sleepy but easily arousable, cooperates with exam, answer simple yes/no questions, but cant give details of history or events of today.  History is obtained from chart review and ED providers report.  Patient told staff earlier that she thinks she took 1/2  hydrocodone and 1/2 Xanax.  Patient had just been started on hydrocodone.  On EMS arrival patient's respiratory rate was 4 bpm, and agonal, O2 sat 60% on room air, with CO2 of 56.  Patient was given 1 mg Narcan, patient became alerts, BP increased to 130/70, O2 sats increased to 91% on 4 L.      Clinical Impression  Patient functioning near baseline for functional mobility and gait, ambulated in room/hallway without loss of balance while on 3 LPM O2 with SpO2 at 93%, HR between 108-114 BPM, patient encouraged to ambulate with nursing staff daily as tolerated for length of stay - RN notified.  Patient tolerated sitting up in chair after therapy.  Patient will benefit from continued physical therapy in hospital and recommended venue below to increase strength, balance, endurance for safe ADLs and gait.     Follow Up Recommendations Home health PT    Equipment Recommendations  None recommended by PT    Recommendations for Other Services       Precautions / Restrictions Precautions Precautions: None Restrictions Weight Bearing Restrictions: No      Mobility  Bed Mobility Overal bed mobility: Modified Independent             General bed mobility comments: increased time  Transfers Overall transfer level: Modified independent                General transfer comment: increased time  Ambulation/Gait Ambulation/Gait assistance: Supervision Gait Distance (Feet): 100 Feet Assistive device: Rolling walker (2 wheeled) Gait Pattern/deviations: Decreased step length - right;Decreased step length - left;Decreased stride length Gait velocity: slightly decreased   General Gait Details: slightly labored cadence without loss of balance, on 3 LPM with SpO2 at 93% and HR between 108-114 BPM  Stairs            Wheelchair Mobility    Modified Rankin (Stroke Patients Only)       Balance Overall balance assessment: Needs assistance Sitting-balance support: Feet supported;No upper extremity supported Sitting balance-Leahy Scale: Good     Standing balance support: During functional activity;No upper extremity supported Standing balance-Leahy Scale: Fair Standing balance comment: fair/good using RW                             Pertinent Vitals/Pain Pain Assessment: No/denies pain    Home Living Family/patient expects to be discharged to:: Private residence Living Arrangements: Alone Available Help at Discharge: Friend(s);Neighbor;Family Type of Home: Apartment Home Access: Stairs to enter Entrance Stairs-Rails: Right;Left;Can reach both Entrance Stairs-Number of Steps: 3 Home Layout: One level Home Equipment: Walker - 2 wheels;Cane - single point;Bedside commode;Transport chair;Shower seat      Prior Function Level of Independence: Independent with assistive device(s)         Comments:  community ambulator with SPC or RW, limited driving     Hand Dominance   Dominant Hand: Right    Extremity/Trunk Assessment   Upper Extremity Assessment Upper Extremity Assessment: Overall WFL for tasks assessed    Lower Extremity Assessment Lower Extremity Assessment: Generalized weakness    Cervical / Trunk Assessment Cervical / Trunk Assessment: Normal  Communication   Communication:  No difficulties  Cognition Arousal/Alertness: Awake/alert Behavior During Therapy: WFL for tasks assessed/performed Overall Cognitive Status: Within Functional Limits for tasks assessed                                        General Comments      Exercises     Assessment/Plan    PT Assessment Patient needs continued PT services  PT Problem List Decreased strength;Decreased activity tolerance;Decreased balance;Decreased mobility       PT Treatment Interventions Therapeutic exercise;Gait training;Stair training;Functional mobility training;Patient/family education    PT Goals (Current goals can be found in the Care Plan section)  Acute Rehab PT Goals Patient Stated Goal: return home PT Goal Formulation: With patient Time For Goal Achievement: 01/17/19 Potential to Achieve Goals: Good    Frequency Min 2X/week   Barriers to discharge        Co-evaluation               AM-PAC PT "6 Clicks" Mobility  Outcome Measure Help needed turning from your back to your side while in a flat bed without using bedrails?: None Help needed moving from lying on your back to sitting on the side of a flat bed without using bedrails?: None Help needed moving to and from a bed to a chair (including a wheelchair)?: None Help needed standing up from a chair using your arms (e.g., wheelchair or bedside chair)?: None Help needed to walk in hospital room?: A Little Help needed climbing 3-5 steps with a railing? : A Little 6 Click Score: 22    End of Session Equipment Utilized During Treatment: Oxygen Activity Tolerance: Patient tolerated treatment well;Patient limited by fatigue Patient left: in chair;with call bell/phone within reach Nurse Communication: Mobility status PT Visit Diagnosis: Unsteadiness on feet (R26.81);Muscle weakness (generalized) (M62.81);Other abnormalities of gait and mobility (R26.89)    Time: 1749-4496 PT Time Calculation (min) (ACUTE ONLY): 22  min   Charges:   PT Evaluation $PT Eval Moderate Complexity: 1 Mod PT Treatments $Therapeutic Activity: 8-22 mins        2:40 PM, 01/10/19 Lonell Grandchild, MPT Physical Therapist with [facility] 336 [phone] office [phone] mobile phone

## 2019-01-10 NOTE — Plan of Care (Signed)
  Problem: Acute Rehab PT Goals(only PT should resolve) Goal: Patient Will Transfer Sit To/From Stand Outcome: Progressing Flowsheets (Taken 01/10/2019 1442) Patient will transfer sit to/from stand: Independently Goal: Pt Will Transfer Bed To Chair/Chair To Bed Outcome: Progressing Flowsheets (Taken 01/10/2019 1442) Pt will Transfer Bed to Chair/Chair to Bed: Independently Goal: Pt Will Ambulate Outcome: Progressing Flowsheets (Taken 01/10/2019 1442) Pt will Ambulate: with modified independence; > 125 feet; with rolling walker; with cane   2:43 PM, 01/10/19 Lonell Grandchild, MPT Physical Therapist with [facility] [phone] office [phone] mobile phone

## 2019-01-10 NOTE — Discharge Summary (Signed)
Physician Discharge Summary  Mary [name] OZD:[id number] DOB: 1951/07/25 DOA: 01/08/2019  PCP: Celene Squibb, MD  Admit date: 01/08/2019 Discharge date: 01/10/2019  Admitted From: Home Disposition: Home  Recommendations for Outpatient Follow-up:  1. Follow up with PCP in 1-2 weeks 2. Please obtain BMP/CBC in one week   Home Health: Home health PT, RN Equipment/Devices:  Discharge Condition: Stable CODE STATUS: Full code Diet recommendation: Heart healthy, carb modified  Brief/Interim Summary: 68 year old female with a history of atrial fibrillation, oxygen dependent COPD, chronic diastolic heart failure, admitted to the hospital with altered mental status.  She was found unconscious by 1 of her friends.  The patient chronically takes Xanax for anxiety and reports that she was recently started on hydrocodone for peripheral neuropathy in her lower extremities.  She was found to be altered/unconscious by her friend.  Work-up showed severe respiratory acidosis.  She was placed on BiPAP with improvement of her symptoms.  She also did respond to Narcan and was eventually placed on a Narcan infusion.  Her mental status has since improved.  Her blood gas has normalized.  Mental status appears to be back to baseline now.  Patient was insistent that she had not taken excessive amounts of opiates.  This medication was recently started 3 days ago.  She denies any suicidal ideations.  She has been advised to discontinue any further narcotics at this point.  Patient was ambulated and did not have any shortness of breath.  She is felt stable to discharge home.  Discharge Diagnoses:  Principal Problem:   Acute metabolic encephalopathy Active Problems:   Diabetes mellitus (HCC)   COPD (chronic obstructive pulmonary disease) (HCC)   Atrial fibrillation (HCC)   Acute on chronic respiratory failure with hypoxia (HCC)   Anemia   Acute on chronic diastolic CHF (congestive heart failure) ([place])   AMS (altered  mental status)   Overdose, undetermined intent, initial encounter    Discharge Instructions  Discharge Instructions    Diet - low sodium heart healthy   Complete by:  As directed    Increase activity slowly   Complete by:  As directed      Allergies as of 01/10/2019      Reactions   Adhesive [tape] Other (See Comments)   Tears skin off.    Latex Other (See Comments)   In tape, tears skin off.   Zocor [simvastatin - High Dose] Nausea And Vomiting      Medication List    STOP taking these medications   HYDROcodone-acetaminophen 5-325 MG tablet Commonly known as:  NORCO/VICODIN     TAKE these medications   albuterol 108 (90 Base) MCG/ACT inhaler Commonly known as:  PROVENTIL HFA;VENTOLIN HFA Inhale 2 puffs into the lungs every 6 (six) hours as needed for shortness of breath.   ALPRAZolam 1 MG tablet Commonly known as:  XANAX Take 1 tablet by mouth every 8 (eight) hours as needed.   apixaban 5 MG Tabs tablet Commonly known as:  ELIQUIS Take 1 tablet (5 mg total) by mouth 2 (two) times daily.   aspirin EC 81 MG tablet Take 81 mg by mouth daily.   atorvastatin 40 MG tablet Commonly known as:  LIPITOR Take 1 tablet (40 mg total) by mouth daily.   budesonide-formoterol 160-4.5 MCG/ACT inhaler Commonly known as:  Symbicort Inhale 2 puffs into the lungs 2 (two) times daily.   diltiazem 240 MG 24 hr capsule Commonly known as:  CARDIZEM CD Take 1 capsule (240 mg  total) by mouth daily.   DULoxetine 60 MG capsule Commonly known as:  CYMBALTA Take 1 capsule by mouth daily.   furosemide 20 MG tablet Commonly known as:  LASIX Take 1 tablet (20 mg total) by mouth daily.   FUSION PLUS PO Take 1 tablet by mouth daily.   glipiZIDE 2.5 MG 24 hr tablet Commonly known as:  GLUCOTROL XL Take 2.5 mg by mouth daily.   Januvia 100 MG tablet Generic drug:  sitaGLIPtin TAKE ONE TABLET BY MOUTH DAILY. What changed:  how much to take   metoprolol tartrate 25 MG  tablet Commonly known as:  LOPRESSOR Take 1 tablet (25 mg total) by mouth 2 (two) times daily.   montelukast 10 MG tablet Commonly known as:  SINGULAIR Take 1 tablet by mouth daily.   pantoprazole 40 MG tablet Commonly known as:  PROTONIX Take 40 mg by mouth daily.       Allergies  Allergen Reactions  . Adhesive [Tape] Other (See Comments)    Tears skin off.   . Latex Other (See Comments)    In tape, tears skin off.  . Zocor [Simvastatin - High Dose] Nausea And Vomiting    Consultations:     Procedures/Studies: Ct Head Wo Contrast  Result Date: 01/08/2019 CLINICAL DATA:  Unresponsive with questionable drug overdose EXAM: CT HEAD WITHOUT CONTRAST TECHNIQUE: Contiguous axial images were obtained from the base of the skull through the vertex without intravenous contrast. COMPARISON:  April 06, 2018 FINDINGS: Brain: The ventricles are normal in size and configuration. There is a small cavum septum pellucidum, an anatomic variant. There is no intracranial mass, hemorrhage, extra-axial fluid collection, or midline shift. There is patchy small vessel disease in the centra semiovale bilaterally, stable. No acute infarct is demonstrable. Vascular: There is no appreciable hyperdense vessel. There is calcification in the distal vertebral arteries as well as in each carotid siphon. Skull: The bony calvarium appears intact. Sinuses/Orbits: Visualized paranasal sinuses are clear. Visualized orbits appear symmetric bilaterally. Other: There is mastoid air cell opacification in several inferior mastoid air cells on the right. Mastoids elsewhere are clear. IMPRESSION: Patchy periventricular small vessel disease, stable. No acute infarct evident. No mass or hemorrhage. There are foci of arterial vascular calcification. There is inferior right mastoid air cell disease. Electronically Signed   By: Lowella Grip III M.D.   On: 01/08/2019 17:14   US Carotid Duplex Bilateral  Result Date:  12/12/2018 CLINICAL DATA:  68 year old female with bilateral carotid artery stenosis EXAM: BILATERAL CAROTID DUPLEX ULTRASOUND TECHNIQUE: Pearline Cables scale imaging, color Doppler and duplex ultrasound were performed of bilateral carotid and vertebral arteries in the neck. COMPARISON:  No prior duplex FINDINGS: Criteria: Quantification of carotid stenosis is based on velocity parameters that correlate the residual internal carotid diameter with NASCET-based stenosis levels, using the diameter of the distal internal carotid lumen as the denominator for stenosis measurement. The following velocity measurements were obtained: RIGHT ICA:  Systolic 876 cm/sec, Diastolic 42 cm/sec CCA:  68 cm/sec SYSTOLIC ICA/CCA RATIO:  2.4 ECA:  243 cm/sec LEFT ICA:  Systolic 811 cm/sec, Diastolic 572 cm/sec CCA:  51 cm/sec SYSTOLIC ICA/CCA RATIO:  6.0 ECA:  162 cm/sec Right Brachial SBP: Not acquired Left Brachial SBP: Not acquired RIGHT CAROTID ARTERY: No significant calcifications of the right common carotid artery. Intermediate waveform maintained. Heterogeneous and partially calcified plaque at the right carotid bifurcation. Presence of lumen shadowing. Low resistance waveform of the right ICA. No significant tortuosity. RIGHT VERTEBRAL ARTERY: Antegrade flow  with low resistance waveform. LEFT CAROTID ARTERY: No significant calcifications of the left common carotid artery. Intermediate waveform maintained. Heterogeneous and partially calcified plaque at the left carotid bifurcation with lumen shadowing. Low resistance waveform of the left ICA. No significant tortuosity. LEFT VERTEBRAL ARTERY:  Antegrade flow with low resistance waveform. IMPRESSION: Right: Heterogeneous and partially calcified plaque at the right carotid bifurcation contributes to 50%-69% stenosis by established duplex criteria. Left: Heterogeneous and partially calcified plaque at the left carotid bifurcation contributes to 70%-99% stenosis by established duplex criteria.  Signed, Dulcy Fanny. Dellia Nims, RPVI [facility] Electronically Signed   By: Corrie Mckusick D.O.   On: 12/12/2018 15:36   Dg Chest Portable 1 View  Result Date: 01/08/2019 CLINICAL DATA:  Unresponsive. EXAM: PORTABLE CHEST 1 VIEW COMPARISON:  11/24/2018 FINDINGS: The heart is mildly enlarged. Moderate tortuosity and calcification of the thoracic aorta. 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 Underlying emphysematous changes and pulmonary scarring. There is interstitial and alveolar pulmonary edema, a small right effusion and bibasilar atelectasis. No pneumothorax. The bony thorax is intact. IMPRESSION: 1. Emphysematous changes and pulmonary scarring. 2. Stable mild cardiac enlargement. 3. Pulmonary edema, small right pleural effusion and bibasilar atelectasis. Electronically Signed   By: Marijo Sanes M.D.   On: 01/08/2019 13:43       Subjective: No chest pain or shortness of breath.  She is feeling better.  Discharge Exam: Vitals:   01/10/19 1400 01/10/19 1500 01/10/19 1600 01/10/19 1700  BP: 108/72 111/68 122/77 136/82  Pulse: 94 92 86 (!) 105  Resp: 20 17 19  (!) 21  Temp:   98.2 F (36.8 C)   TempSrc:   Oral   SpO2: (!) 89% 94% 97% 94%  Weight:      Height:        General: Pt is alert, awake, not in acute distress Cardiovascular: Irregular, S1/S2 +, no rubs, no gallops Respiratory: CTA bilaterally, no wheezing, no rhonchi Abdominal: Soft, NT, ND, bowel sounds + Extremities: no edema, no cyanosis    The results of significant diagnostics from this hospitalization (including imaging, microbiology, ancillary and laboratory) are listed below for reference.     Microbiology: Recent Results (from the past 240 hour(s))  Blood Culture (routine x 2)     Status: None (Preliminary result)   Collection Time: 01/08/19  1:14 PM  Result Value Ref Range Status   Specimen  Description LEFT ANTECUBITAL  Final   Special Requests   Final    BOTTLES DRAWN AEROBIC AND ANAEROBIC Blood Culture adequate volume   Culture   Final    NO GROWTH 2 DAYS Performed at [facility], [address], [place], [place] [address]    Report Status PENDING  Incomplete  Blood Culture (routine x 2)     Status: None (Preliminary result)   Collection Time: 01/08/19  1:32 PM  Result Value Ref Range Status   Specimen Description LEFT ANTECUBITAL  Final   Special Requests   Final    BOTTLES DRAWN AEROBIC AND ANAEROBIC Blood Culture results may not be optimal due to an inadequate volume of blood received in culture bottles   Culture   Final    NO GROWTH 2 DAYS Performed at [facility], [address], [place], [place] [address]    Report Status PENDING  Incomplete  MRSA PCR Screening     Status: None   Collection Time: 01/08/19  9:20 PM  Result Value Ref Range Status   MRSA by PCR NEGATIVE NEGATIVE Final    Comment:        The GeneXpert MRSA Assay (FDA approved for NASAL specimens only), is one component of a comprehensive MRSA colonization surveillance program. It is not intended to diagnose MRSA infection nor to guide or monitor treatment for MRSA infections. Performed at [facility], [address], [place], [place] [address]      Labs: BNP (last 3 results) Recent Labs    11/09/18 0421 01/08/19 1332  BNP 249.0* 094.7*   Basic Metabolic Panel: Recent Labs  Lab 01/08/19 1314 01/09/19 0415 01/10/19 0519  NA 136 137 139  K 4.7 4.3 3.9  CL 98 98 96*  CO2 28 32 36*  GLUCOSE 208* 274* 164*  BUN 16 21 20   CREATININE 0.93 1.13* 0.75  CALCIUM 9.1 9.1 9.4   Liver Function Tests: Recent Labs  Lab 01/08/19 1314  AST 23  ALT 14  ALKPHOS 67  BILITOT 0.7  PROT 7.4  ALBUMIN 3.8   No results for input(s): LIPASE, AMYLASE in the last 168 hours. No results for input(s): AMMONIA in the last 168 hours. CBC: Recent Labs  Lab 01/08/19 1314 01/09/19 0415  01/10/19 0519  WBC 13.0* 10.2 11.5*  NEUTROABS 11.0*  --   --   HGB 8.8* 8.1* 7.6*  HCT 29.4* 27.3* 24.0*  MCV 97.0 96.8 92.7  PLT 208 188 194   Cardiac Enzymes: Recent Labs  Lab 01/08/19 1314 01/08/19 1619  TROPONINI <0.03 <0.03   BNP: Invalid input(s): POCBNP CBG: Recent Labs  Lab 01/09/19 1646 01/09/19 2105 01/10/19 0745 01/10/19 1138 01/10/19 1541  GLUCAP 163* 264* 156* 222* 93   D-Dimer No results for input(s): DDIMER in the last 72 hours. Hgb A1c No results for input(s): HGBA1C in the last 72 hours. Lipid Profile No results for input(s): CHOL, HDL, LDLCALC, TRIG, CHOLHDL, LDLDIRECT in the last 72 hours. Thyroid function studies No results for input(s): TSH, T4TOTAL, T3FREE, THYROIDAB in the last 72 hours.  Invalid input(s): FREET3 Anemia work up No results for input(s): VITAMINB12, FOLATE, FERRITIN, TIBC, IRON, RETICCTPCT in the last 72 hours. Urinalysis    Component Value Date/Time   COLORURINE YELLOW 04/06/2018 1311   APPEARANCEUR CLEAR 04/06/2018 1311   LABSPEC 1.008 04/06/2018 1311   PHURINE 6.0 04/06/2018 1311   GLUCOSEU NEGATIVE 04/06/2018 1311   HGBUR NEGATIVE 04/06/2018 1311   BILIRUBINUR NEGATIVE 04/06/2018 1311   KETONESUR NEGATIVE 04/06/2018 1311   PROTEINUR NEGATIVE 04/06/2018 1311   UROBILINOGEN 0.2 02/17/2015 2040   NITRITE NEGATIVE 04/06/2018 1311   LEUKOCYTESUR NEGATIVE 04/06/2018 1311   Sepsis Labs Invalid input(s): PROCALCITONIN,  WBC,  LACTICIDVEN Microbiology Recent Results (from the past 240 hour(s))  Blood Culture (routine x 2)     Status: None (Preliminary result)   Collection Time: 01/08/19  1:14 PM  Result Value Ref Range Status   Specimen Description LEFT ANTECUBITAL  Final   Special Requests   Final    BOTTLES DRAWN AEROBIC AND ANAEROBIC Blood Culture adequate volume   Culture   Final    NO GROWTH 2 DAYS Performed at [facility], [address] [place],  [address]    Report Status PENDING  Incomplete   Blood Culture (routine x 2)     Status: None (Preliminary result)   Collection Time: 01/08/19  1:32 PM  Result Value Ref Range Status   Specimen Description LEFT ANTECUBITAL  Final   Special Requests  Final    BOTTLES DRAWN AEROBIC AND ANAEROBIC Blood Culture results may not be optimal due to an inadequate volume of blood received in culture bottles   Culture   Final    NO GROWTH 2 DAYS Performed at [facility], [address], [place], [place] [address]    Report Status PENDING  Incomplete  MRSA PCR Screening     Status: None   Collection Time: 01/08/19  9:20 PM  Result Value Ref Range Status   MRSA by PCR NEGATIVE NEGATIVE Final    Comment:        The GeneXpert MRSA Assay (FDA approved for NASAL specimens only), is one component of a comprehensive MRSA colonization surveillance program. It is not intended to diagnose MRSA infection nor to guide or monitor treatment for MRSA infections. Performed at [facility], [address] [place],  [address]      Time coordinating discharge: 3mins  SIGNED:   Kathie Dike, MD  [facility] 01/10/2019, 9:21 PM   If 7PM-7AM, please contact night-coverage [email or web address]

## 2019-01-10 NOTE — TOC Transition Note (Signed)
Transition of Care [facility]) - CM/SW Discharge Note   Patient Details  Name: Mary [name] MRN: [id number] Date of Birth: 08-Oct-1951  Transition of Care [facility]) CM/SW Contact:  Trish Mage, LCSW Phone Number: 01/10/2019, 4:08 PM   Clinical Narrative:   Pt agrees to [place] referral through [facility] after hearing recommendation of PT eval.  States she already gets O2 through [facility], and that she has gotten PT services through them in the past.      Barriers to Discharge: No Barriers Identified   Patient Goals and CMS Choice Patient states their goals for this hospitalization and ongoing recovery are:: To get back to baseline and feel better  CMS Medicare.gov Compare Post Acute Care list provided to:: Patient Choice offered to / list presented to : Patient  Discharge Placement  Return home                     Discharge Plan and Services Discharge Planning Services: CM Consult                HH Arranged: PT [facility] Agency: [place] [facility]   Social Determinants of Health (SDOH) Interventions  None   Readmission Risk Interventions Readmission Risk Prevention Plan 01/09/2019  Transportation Screening Complete  Some recent data might be hidden

## 2019-01-10 NOTE — Plan of Care (Signed)

## 2019-01-11 DIAGNOSIS — J449 Chronic obstructive pulmonary disease, unspecified: Secondary | ICD-10-CM | POA: Diagnosis not present

## 2019-01-11 DIAGNOSIS — G9341 Metabolic encephalopathy: Secondary | ICD-10-CM | POA: Diagnosis not present

## 2019-01-12 DIAGNOSIS — T424X4S Poisoning by benzodiazepines, undetermined, sequela: Secondary | ICD-10-CM | POA: Diagnosis not present

## 2019-01-12 DIAGNOSIS — I6523 Occlusion and stenosis of bilateral carotid arteries: Secondary | ICD-10-CM | POA: Diagnosis not present

## 2019-01-12 DIAGNOSIS — I2510 Atherosclerotic heart disease of native coronary artery without angina pectoris: Secondary | ICD-10-CM | POA: Diagnosis not present

## 2019-01-12 DIAGNOSIS — J9621 Acute and chronic respiratory failure with hypoxia: Secondary | ICD-10-CM | POA: Diagnosis not present

## 2019-01-12 DIAGNOSIS — J439 Emphysema, unspecified: Secondary | ICD-10-CM | POA: Diagnosis not present

## 2019-01-12 DIAGNOSIS — T40604S Poisoning by unspecified narcotics, undetermined, sequela: Secondary | ICD-10-CM | POA: Diagnosis not present

## 2019-01-12 DIAGNOSIS — M549 Dorsalgia, unspecified: Secondary | ICD-10-CM | POA: Diagnosis not present

## 2019-01-12 DIAGNOSIS — G9341 Metabolic encephalopathy: Secondary | ICD-10-CM | POA: Diagnosis not present

## 2019-01-12 DIAGNOSIS — J9602 Acute respiratory failure with hypercapnia: Secondary | ICD-10-CM | POA: Diagnosis not present

## 2019-01-12 DIAGNOSIS — E785 Hyperlipidemia, unspecified: Secondary | ICD-10-CM | POA: Diagnosis not present

## 2019-01-12 DIAGNOSIS — I5033 Acute on chronic diastolic (congestive) heart failure: Secondary | ICD-10-CM | POA: Diagnosis not present

## 2019-01-12 DIAGNOSIS — E1142 Type 2 diabetes mellitus with diabetic polyneuropathy: Secondary | ICD-10-CM | POA: Diagnosis not present

## 2019-01-12 DIAGNOSIS — Z951 Presence of aortocoronary bypass graft: Secondary | ICD-10-CM | POA: Diagnosis not present

## 2019-01-12 DIAGNOSIS — Z7951 Long term (current) use of inhaled steroids: Secondary | ICD-10-CM | POA: Diagnosis not present

## 2019-01-12 DIAGNOSIS — G8929 Other chronic pain: Secondary | ICD-10-CM | POA: Diagnosis not present

## 2019-01-12 DIAGNOSIS — Z9981 Dependence on supplemental oxygen: Secondary | ICD-10-CM | POA: Diagnosis not present

## 2019-01-12 DIAGNOSIS — I4891 Unspecified atrial fibrillation: Secondary | ICD-10-CM | POA: Diagnosis not present

## 2019-01-12 DIAGNOSIS — Z7984 Long term (current) use of oral hypoglycemic drugs: Secondary | ICD-10-CM | POA: Diagnosis not present

## 2019-01-12 DIAGNOSIS — I110 Hypertensive heart disease with heart failure: Secondary | ICD-10-CM | POA: Diagnosis not present

## 2019-01-12 DIAGNOSIS — Z7901 Long term (current) use of anticoagulants: Secondary | ICD-10-CM | POA: Diagnosis not present

## 2019-01-12 DIAGNOSIS — D649 Anemia, unspecified: Secondary | ICD-10-CM | POA: Diagnosis not present

## 2019-01-12 DIAGNOSIS — M1990 Unspecified osteoarthritis, unspecified site: Secondary | ICD-10-CM | POA: Diagnosis not present

## 2019-01-12 DIAGNOSIS — Z7982 Long term (current) use of aspirin: Secondary | ICD-10-CM | POA: Diagnosis not present

## 2019-01-13 LAB — CULTURE, BLOOD (ROUTINE X 2)
Culture: NO GROWTH
Culture: NO GROWTH
Special Requests: ADEQUATE

## 2019-01-14 DIAGNOSIS — J439 Emphysema, unspecified: Secondary | ICD-10-CM | POA: Diagnosis not present

## 2019-01-14 DIAGNOSIS — Z951 Presence of aortocoronary bypass graft: Secondary | ICD-10-CM | POA: Diagnosis not present

## 2019-01-14 DIAGNOSIS — I4891 Unspecified atrial fibrillation: Secondary | ICD-10-CM | POA: Diagnosis not present

## 2019-01-14 DIAGNOSIS — Z7984 Long term (current) use of oral hypoglycemic drugs: Secondary | ICD-10-CM | POA: Diagnosis not present

## 2019-01-14 DIAGNOSIS — I6523 Occlusion and stenosis of bilateral carotid arteries: Secondary | ICD-10-CM | POA: Diagnosis not present

## 2019-01-14 DIAGNOSIS — J9602 Acute respiratory failure with hypercapnia: Secondary | ICD-10-CM | POA: Diagnosis not present

## 2019-01-14 DIAGNOSIS — J9621 Acute and chronic respiratory failure with hypoxia: Secondary | ICD-10-CM | POA: Diagnosis not present

## 2019-01-14 DIAGNOSIS — M1990 Unspecified osteoarthritis, unspecified site: Secondary | ICD-10-CM | POA: Diagnosis not present

## 2019-01-14 DIAGNOSIS — I110 Hypertensive heart disease with heart failure: Secondary | ICD-10-CM | POA: Diagnosis not present

## 2019-01-14 DIAGNOSIS — M549 Dorsalgia, unspecified: Secondary | ICD-10-CM | POA: Diagnosis not present

## 2019-01-14 DIAGNOSIS — I2510 Atherosclerotic heart disease of native coronary artery without angina pectoris: Secondary | ICD-10-CM | POA: Diagnosis not present

## 2019-01-14 DIAGNOSIS — E1142 Type 2 diabetes mellitus with diabetic polyneuropathy: Secondary | ICD-10-CM | POA: Diagnosis not present

## 2019-01-14 DIAGNOSIS — T40604S Poisoning by unspecified narcotics, undetermined, sequela: Secondary | ICD-10-CM | POA: Diagnosis not present

## 2019-01-14 DIAGNOSIS — Z9981 Dependence on supplemental oxygen: Secondary | ICD-10-CM | POA: Diagnosis not present

## 2019-01-14 DIAGNOSIS — Z7951 Long term (current) use of inhaled steroids: Secondary | ICD-10-CM | POA: Diagnosis not present

## 2019-01-14 DIAGNOSIS — G8929 Other chronic pain: Secondary | ICD-10-CM | POA: Diagnosis not present

## 2019-01-14 DIAGNOSIS — T424X4S Poisoning by benzodiazepines, undetermined, sequela: Secondary | ICD-10-CM | POA: Diagnosis not present

## 2019-01-14 DIAGNOSIS — Z7901 Long term (current) use of anticoagulants: Secondary | ICD-10-CM | POA: Diagnosis not present

## 2019-01-14 DIAGNOSIS — G9341 Metabolic encephalopathy: Secondary | ICD-10-CM | POA: Diagnosis not present

## 2019-01-14 DIAGNOSIS — I5033 Acute on chronic diastolic (congestive) heart failure: Secondary | ICD-10-CM | POA: Diagnosis not present

## 2019-01-14 DIAGNOSIS — E785 Hyperlipidemia, unspecified: Secondary | ICD-10-CM | POA: Diagnosis not present

## 2019-01-14 DIAGNOSIS — Z7982 Long term (current) use of aspirin: Secondary | ICD-10-CM | POA: Diagnosis not present

## 2019-01-14 DIAGNOSIS — D649 Anemia, unspecified: Secondary | ICD-10-CM | POA: Diagnosis not present

## 2019-01-16 ENCOUNTER — Other Ambulatory Visit: Payer: Self-pay | Admitting: *Deleted

## 2019-01-16 DIAGNOSIS — I6523 Occlusion and stenosis of bilateral carotid arteries: Secondary | ICD-10-CM | POA: Diagnosis not present

## 2019-01-16 DIAGNOSIS — Z9981 Dependence on supplemental oxygen: Secondary | ICD-10-CM | POA: Diagnosis not present

## 2019-01-16 DIAGNOSIS — E785 Hyperlipidemia, unspecified: Secondary | ICD-10-CM | POA: Diagnosis not present

## 2019-01-16 DIAGNOSIS — J439 Emphysema, unspecified: Secondary | ICD-10-CM | POA: Diagnosis not present

## 2019-01-16 DIAGNOSIS — Z7951 Long term (current) use of inhaled steroids: Secondary | ICD-10-CM | POA: Diagnosis not present

## 2019-01-16 DIAGNOSIS — I4891 Unspecified atrial fibrillation: Secondary | ICD-10-CM | POA: Diagnosis not present

## 2019-01-16 DIAGNOSIS — Z951 Presence of aortocoronary bypass graft: Secondary | ICD-10-CM | POA: Diagnosis not present

## 2019-01-16 DIAGNOSIS — I5033 Acute on chronic diastolic (congestive) heart failure: Secondary | ICD-10-CM | POA: Diagnosis not present

## 2019-01-16 DIAGNOSIS — G8929 Other chronic pain: Secondary | ICD-10-CM | POA: Diagnosis not present

## 2019-01-16 DIAGNOSIS — I2510 Atherosclerotic heart disease of native coronary artery without angina pectoris: Secondary | ICD-10-CM | POA: Diagnosis not present

## 2019-01-16 DIAGNOSIS — G9341 Metabolic encephalopathy: Secondary | ICD-10-CM | POA: Diagnosis not present

## 2019-01-16 DIAGNOSIS — Z7984 Long term (current) use of oral hypoglycemic drugs: Secondary | ICD-10-CM | POA: Diagnosis not present

## 2019-01-16 DIAGNOSIS — E1142 Type 2 diabetes mellitus with diabetic polyneuropathy: Secondary | ICD-10-CM | POA: Diagnosis not present

## 2019-01-16 DIAGNOSIS — Z7901 Long term (current) use of anticoagulants: Secondary | ICD-10-CM | POA: Diagnosis not present

## 2019-01-16 DIAGNOSIS — T40604S Poisoning by unspecified narcotics, undetermined, sequela: Secondary | ICD-10-CM | POA: Diagnosis not present

## 2019-01-16 DIAGNOSIS — T424X4S Poisoning by benzodiazepines, undetermined, sequela: Secondary | ICD-10-CM | POA: Diagnosis not present

## 2019-01-16 DIAGNOSIS — D649 Anemia, unspecified: Secondary | ICD-10-CM | POA: Diagnosis not present

## 2019-01-16 DIAGNOSIS — I110 Hypertensive heart disease with heart failure: Secondary | ICD-10-CM | POA: Diagnosis not present

## 2019-01-16 DIAGNOSIS — Z7982 Long term (current) use of aspirin: Secondary | ICD-10-CM | POA: Diagnosis not present

## 2019-01-16 DIAGNOSIS — J9621 Acute and chronic respiratory failure with hypoxia: Secondary | ICD-10-CM | POA: Diagnosis not present

## 2019-01-16 DIAGNOSIS — J9602 Acute respiratory failure with hypercapnia: Secondary | ICD-10-CM | POA: Diagnosis not present

## 2019-01-16 DIAGNOSIS — M1990 Unspecified osteoarthritis, unspecified site: Secondary | ICD-10-CM | POA: Diagnosis not present

## 2019-01-16 DIAGNOSIS — M549 Dorsalgia, unspecified: Secondary | ICD-10-CM | POA: Diagnosis not present

## 2019-01-16 NOTE — Patient Outreach (Signed)
[facility]) Care Management  01/16/2019  Mary [name] [birth year] [id number]   EMMI-general discharge  RED ON EMMI ALERT Day # 1  Date: 01/13/19 1613 Friday  Red Alert Reason: Got discharge papers? I Don't Know Know who to call about changes in condition? No Scheduled follow-up? Doesn't Apply  Insurance: united [employer] medicare   [facility] admissions x 3  ED visits x 3 in the last 6 months    Outreach attempt # 1 successful  Patient is able to verify HIPAA [facility] Care Management RN reviewed and addressed red alert with patient  Mary [name] confirms that all the answers documented by the [facility] automated system is incorrect. She confirms she did get discharge papers but was missing some items and medications left at [facility]. These medications has been since reconciled via her primary care provider office. She is aware that she is to call her primary car MD, Dr Mary [name] about any medical changes in her condition. She denies any medical condition changes during this call.  She reports she is scheduled to be seen by the primary care MD and the MD's pharmacy staff on 01/17/19   Dx HF, Afib, HTN, COPD, DM  Consent: [facility] RN CM reviewed [facility] services with patient. Patient gave verbal consent for services.   Advised patient that there will be further automated EMMI- post discharge calls to assess how the patient is doing following the recent hospitalization Advised the patient that another call may be received from a nurse if any of their responses were abnormal. Patient voiced understanding and was appreciative of f/u call.   Plan: [facility] RN CM will close case at this time as patient has been assessed and no needs identified/needs resolved.   Pt encouraged to return a call to [facility] RN CM prn  Alizandra Loh L. Lavina Hamman, RN, BSN, [place] Coordinator Office number [phone] Mobile number [phone]  Main [facility] number [phone] Fax number  [phone]

## 2019-01-17 ENCOUNTER — Telehealth: Payer: Self-pay | Admitting: *Deleted

## 2019-01-17 DIAGNOSIS — T50901A Poisoning by unspecified drugs, medicaments and biological substances, accidental (unintentional), initial encounter: Secondary | ICD-10-CM | POA: Diagnosis not present

## 2019-01-17 DIAGNOSIS — Z09 Encounter for follow-up examination after completed treatment for conditions other than malignant neoplasm: Secondary | ICD-10-CM | POA: Diagnosis not present

## 2019-01-17 NOTE — Telephone Encounter (Signed)
   Cardiac Questionnaire:    Since your last visit or hospitalization:    1. Have you been having new or worsening chest pain? no   2. Have you been having new or worsening shortness of breath? no 3. Have you been having new or worsening leg swelling, wt gain, or increase in abdominal girth (pants fitting more tightly)? no   4. Have you had any passing out spells? no    *A YES to any of these questions would result in the appointment being kept. *If all the answers to these questions are NO, we should indicate that given the current situation regarding the worldwide coronarvirus pandemic, at the recommendation of the CDC, we are looking to limit gatherings in our waiting area, and thus will reschedule their appointment beyond four weeks from today.    Follow up scheduled          .

## 2019-01-18 ENCOUNTER — Ambulatory Visit: Payer: Medicare Other | Admitting: Cardiovascular Disease

## 2019-01-18 DIAGNOSIS — M549 Dorsalgia, unspecified: Secondary | ICD-10-CM | POA: Diagnosis not present

## 2019-01-18 DIAGNOSIS — I2510 Atherosclerotic heart disease of native coronary artery without angina pectoris: Secondary | ICD-10-CM | POA: Diagnosis not present

## 2019-01-18 DIAGNOSIS — M1990 Unspecified osteoarthritis, unspecified site: Secondary | ICD-10-CM | POA: Diagnosis not present

## 2019-01-18 DIAGNOSIS — G9341 Metabolic encephalopathy: Secondary | ICD-10-CM | POA: Diagnosis not present

## 2019-01-18 DIAGNOSIS — Z7982 Long term (current) use of aspirin: Secondary | ICD-10-CM | POA: Diagnosis not present

## 2019-01-18 DIAGNOSIS — G8929 Other chronic pain: Secondary | ICD-10-CM | POA: Diagnosis not present

## 2019-01-18 DIAGNOSIS — J439 Emphysema, unspecified: Secondary | ICD-10-CM | POA: Diagnosis not present

## 2019-01-18 DIAGNOSIS — Z7984 Long term (current) use of oral hypoglycemic drugs: Secondary | ICD-10-CM | POA: Diagnosis not present

## 2019-01-18 DIAGNOSIS — I110 Hypertensive heart disease with heart failure: Secondary | ICD-10-CM | POA: Diagnosis not present

## 2019-01-18 DIAGNOSIS — E1142 Type 2 diabetes mellitus with diabetic polyneuropathy: Secondary | ICD-10-CM | POA: Diagnosis not present

## 2019-01-18 DIAGNOSIS — J9621 Acute and chronic respiratory failure with hypoxia: Secondary | ICD-10-CM | POA: Diagnosis not present

## 2019-01-18 DIAGNOSIS — I5033 Acute on chronic diastolic (congestive) heart failure: Secondary | ICD-10-CM | POA: Diagnosis not present

## 2019-01-18 DIAGNOSIS — T424X4S Poisoning by benzodiazepines, undetermined, sequela: Secondary | ICD-10-CM | POA: Diagnosis not present

## 2019-01-18 DIAGNOSIS — I4891 Unspecified atrial fibrillation: Secondary | ICD-10-CM | POA: Diagnosis not present

## 2019-01-18 DIAGNOSIS — I6523 Occlusion and stenosis of bilateral carotid arteries: Secondary | ICD-10-CM | POA: Diagnosis not present

## 2019-01-18 DIAGNOSIS — E785 Hyperlipidemia, unspecified: Secondary | ICD-10-CM | POA: Diagnosis not present

## 2019-01-18 DIAGNOSIS — J9602 Acute respiratory failure with hypercapnia: Secondary | ICD-10-CM | POA: Diagnosis not present

## 2019-01-18 DIAGNOSIS — Z7951 Long term (current) use of inhaled steroids: Secondary | ICD-10-CM | POA: Diagnosis not present

## 2019-01-18 DIAGNOSIS — D649 Anemia, unspecified: Secondary | ICD-10-CM | POA: Diagnosis not present

## 2019-01-18 DIAGNOSIS — Z9981 Dependence on supplemental oxygen: Secondary | ICD-10-CM | POA: Diagnosis not present

## 2019-01-18 DIAGNOSIS — T40604S Poisoning by unspecified narcotics, undetermined, sequela: Secondary | ICD-10-CM | POA: Diagnosis not present

## 2019-01-18 DIAGNOSIS — Z951 Presence of aortocoronary bypass graft: Secondary | ICD-10-CM | POA: Diagnosis not present

## 2019-01-18 DIAGNOSIS — Z7901 Long term (current) use of anticoagulants: Secondary | ICD-10-CM | POA: Diagnosis not present

## 2019-01-19 DIAGNOSIS — D649 Anemia, unspecified: Secondary | ICD-10-CM | POA: Diagnosis not present

## 2019-01-19 DIAGNOSIS — T40604S Poisoning by unspecified narcotics, undetermined, sequela: Secondary | ICD-10-CM | POA: Diagnosis not present

## 2019-01-19 DIAGNOSIS — Z7984 Long term (current) use of oral hypoglycemic drugs: Secondary | ICD-10-CM | POA: Diagnosis not present

## 2019-01-19 DIAGNOSIS — I4891 Unspecified atrial fibrillation: Secondary | ICD-10-CM | POA: Diagnosis not present

## 2019-01-19 DIAGNOSIS — Z7901 Long term (current) use of anticoagulants: Secondary | ICD-10-CM | POA: Diagnosis not present

## 2019-01-19 DIAGNOSIS — E1142 Type 2 diabetes mellitus with diabetic polyneuropathy: Secondary | ICD-10-CM | POA: Diagnosis not present

## 2019-01-19 DIAGNOSIS — G9341 Metabolic encephalopathy: Secondary | ICD-10-CM | POA: Diagnosis not present

## 2019-01-19 DIAGNOSIS — J9602 Acute respiratory failure with hypercapnia: Secondary | ICD-10-CM | POA: Diagnosis not present

## 2019-01-19 DIAGNOSIS — Z951 Presence of aortocoronary bypass graft: Secondary | ICD-10-CM | POA: Diagnosis not present

## 2019-01-19 DIAGNOSIS — I5033 Acute on chronic diastolic (congestive) heart failure: Secondary | ICD-10-CM | POA: Diagnosis not present

## 2019-01-19 DIAGNOSIS — M549 Dorsalgia, unspecified: Secondary | ICD-10-CM | POA: Diagnosis not present

## 2019-01-19 DIAGNOSIS — Z9981 Dependence on supplemental oxygen: Secondary | ICD-10-CM | POA: Diagnosis not present

## 2019-01-19 DIAGNOSIS — M1990 Unspecified osteoarthritis, unspecified site: Secondary | ICD-10-CM | POA: Diagnosis not present

## 2019-01-19 DIAGNOSIS — I6523 Occlusion and stenosis of bilateral carotid arteries: Secondary | ICD-10-CM | POA: Diagnosis not present

## 2019-01-19 DIAGNOSIS — Z7982 Long term (current) use of aspirin: Secondary | ICD-10-CM | POA: Diagnosis not present

## 2019-01-19 DIAGNOSIS — I110 Hypertensive heart disease with heart failure: Secondary | ICD-10-CM | POA: Diagnosis not present

## 2019-01-19 DIAGNOSIS — Z7951 Long term (current) use of inhaled steroids: Secondary | ICD-10-CM | POA: Diagnosis not present

## 2019-01-19 DIAGNOSIS — E785 Hyperlipidemia, unspecified: Secondary | ICD-10-CM | POA: Diagnosis not present

## 2019-01-19 DIAGNOSIS — J9621 Acute and chronic respiratory failure with hypoxia: Secondary | ICD-10-CM | POA: Diagnosis not present

## 2019-01-19 DIAGNOSIS — T424X4S Poisoning by benzodiazepines, undetermined, sequela: Secondary | ICD-10-CM | POA: Diagnosis not present

## 2019-01-19 DIAGNOSIS — J439 Emphysema, unspecified: Secondary | ICD-10-CM | POA: Diagnosis not present

## 2019-01-19 DIAGNOSIS — I2510 Atherosclerotic heart disease of native coronary artery without angina pectoris: Secondary | ICD-10-CM | POA: Diagnosis not present

## 2019-01-19 DIAGNOSIS — G8929 Other chronic pain: Secondary | ICD-10-CM | POA: Diagnosis not present

## 2019-01-21 DIAGNOSIS — Z7984 Long term (current) use of oral hypoglycemic drugs: Secondary | ICD-10-CM | POA: Diagnosis not present

## 2019-01-21 DIAGNOSIS — J9602 Acute respiratory failure with hypercapnia: Secondary | ICD-10-CM | POA: Diagnosis not present

## 2019-01-21 DIAGNOSIS — I4891 Unspecified atrial fibrillation: Secondary | ICD-10-CM | POA: Diagnosis not present

## 2019-01-21 DIAGNOSIS — M1990 Unspecified osteoarthritis, unspecified site: Secondary | ICD-10-CM | POA: Diagnosis not present

## 2019-01-21 DIAGNOSIS — T424X4S Poisoning by benzodiazepines, undetermined, sequela: Secondary | ICD-10-CM | POA: Diagnosis not present

## 2019-01-21 DIAGNOSIS — Z951 Presence of aortocoronary bypass graft: Secondary | ICD-10-CM | POA: Diagnosis not present

## 2019-01-21 DIAGNOSIS — E785 Hyperlipidemia, unspecified: Secondary | ICD-10-CM | POA: Diagnosis not present

## 2019-01-21 DIAGNOSIS — E1142 Type 2 diabetes mellitus with diabetic polyneuropathy: Secondary | ICD-10-CM | POA: Diagnosis not present

## 2019-01-21 DIAGNOSIS — Z7901 Long term (current) use of anticoagulants: Secondary | ICD-10-CM | POA: Diagnosis not present

## 2019-01-21 DIAGNOSIS — I110 Hypertensive heart disease with heart failure: Secondary | ICD-10-CM | POA: Diagnosis not present

## 2019-01-21 DIAGNOSIS — I5033 Acute on chronic diastolic (congestive) heart failure: Secondary | ICD-10-CM | POA: Diagnosis not present

## 2019-01-21 DIAGNOSIS — M549 Dorsalgia, unspecified: Secondary | ICD-10-CM | POA: Diagnosis not present

## 2019-01-21 DIAGNOSIS — I6523 Occlusion and stenosis of bilateral carotid arteries: Secondary | ICD-10-CM | POA: Diagnosis not present

## 2019-01-21 DIAGNOSIS — D649 Anemia, unspecified: Secondary | ICD-10-CM | POA: Diagnosis not present

## 2019-01-21 DIAGNOSIS — J9621 Acute and chronic respiratory failure with hypoxia: Secondary | ICD-10-CM | POA: Diagnosis not present

## 2019-01-21 DIAGNOSIS — J439 Emphysema, unspecified: Secondary | ICD-10-CM | POA: Diagnosis not present

## 2019-01-21 DIAGNOSIS — G9341 Metabolic encephalopathy: Secondary | ICD-10-CM | POA: Diagnosis not present

## 2019-01-21 DIAGNOSIS — Z7951 Long term (current) use of inhaled steroids: Secondary | ICD-10-CM | POA: Diagnosis not present

## 2019-01-21 DIAGNOSIS — Z7982 Long term (current) use of aspirin: Secondary | ICD-10-CM | POA: Diagnosis not present

## 2019-01-21 DIAGNOSIS — G8929 Other chronic pain: Secondary | ICD-10-CM | POA: Diagnosis not present

## 2019-01-21 DIAGNOSIS — Z9981 Dependence on supplemental oxygen: Secondary | ICD-10-CM | POA: Diagnosis not present

## 2019-01-21 DIAGNOSIS — T40604S Poisoning by unspecified narcotics, undetermined, sequela: Secondary | ICD-10-CM | POA: Diagnosis not present

## 2019-01-21 DIAGNOSIS — I2510 Atherosclerotic heart disease of native coronary artery without angina pectoris: Secondary | ICD-10-CM | POA: Diagnosis not present

## 2019-01-23 DIAGNOSIS — Z7951 Long term (current) use of inhaled steroids: Secondary | ICD-10-CM | POA: Diagnosis not present

## 2019-01-23 DIAGNOSIS — T424X4S Poisoning by benzodiazepines, undetermined, sequela: Secondary | ICD-10-CM | POA: Diagnosis not present

## 2019-01-23 DIAGNOSIS — I6523 Occlusion and stenosis of bilateral carotid arteries: Secondary | ICD-10-CM | POA: Diagnosis not present

## 2019-01-23 DIAGNOSIS — Z951 Presence of aortocoronary bypass graft: Secondary | ICD-10-CM | POA: Diagnosis not present

## 2019-01-23 DIAGNOSIS — M1990 Unspecified osteoarthritis, unspecified site: Secondary | ICD-10-CM | POA: Diagnosis not present

## 2019-01-23 DIAGNOSIS — M549 Dorsalgia, unspecified: Secondary | ICD-10-CM | POA: Diagnosis not present

## 2019-01-23 DIAGNOSIS — I5033 Acute on chronic diastolic (congestive) heart failure: Secondary | ICD-10-CM | POA: Diagnosis not present

## 2019-01-23 DIAGNOSIS — Z9981 Dependence on supplemental oxygen: Secondary | ICD-10-CM | POA: Diagnosis not present

## 2019-01-23 DIAGNOSIS — J439 Emphysema, unspecified: Secondary | ICD-10-CM | POA: Diagnosis not present

## 2019-01-23 DIAGNOSIS — I110 Hypertensive heart disease with heart failure: Secondary | ICD-10-CM | POA: Diagnosis not present

## 2019-01-23 DIAGNOSIS — Z7901 Long term (current) use of anticoagulants: Secondary | ICD-10-CM | POA: Diagnosis not present

## 2019-01-23 DIAGNOSIS — E785 Hyperlipidemia, unspecified: Secondary | ICD-10-CM | POA: Diagnosis not present

## 2019-01-23 DIAGNOSIS — G9341 Metabolic encephalopathy: Secondary | ICD-10-CM | POA: Diagnosis not present

## 2019-01-23 DIAGNOSIS — I2510 Atherosclerotic heart disease of native coronary artery without angina pectoris: Secondary | ICD-10-CM | POA: Diagnosis not present

## 2019-01-23 DIAGNOSIS — Z7984 Long term (current) use of oral hypoglycemic drugs: Secondary | ICD-10-CM | POA: Diagnosis not present

## 2019-01-23 DIAGNOSIS — D649 Anemia, unspecified: Secondary | ICD-10-CM | POA: Diagnosis not present

## 2019-01-23 DIAGNOSIS — Z7982 Long term (current) use of aspirin: Secondary | ICD-10-CM | POA: Diagnosis not present

## 2019-01-23 DIAGNOSIS — J9621 Acute and chronic respiratory failure with hypoxia: Secondary | ICD-10-CM | POA: Diagnosis not present

## 2019-01-23 DIAGNOSIS — E1142 Type 2 diabetes mellitus with diabetic polyneuropathy: Secondary | ICD-10-CM | POA: Diagnosis not present

## 2019-01-23 DIAGNOSIS — T40604S Poisoning by unspecified narcotics, undetermined, sequela: Secondary | ICD-10-CM | POA: Diagnosis not present

## 2019-01-23 DIAGNOSIS — J9602 Acute respiratory failure with hypercapnia: Secondary | ICD-10-CM | POA: Diagnosis not present

## 2019-01-23 DIAGNOSIS — G8929 Other chronic pain: Secondary | ICD-10-CM | POA: Diagnosis not present

## 2019-01-23 DIAGNOSIS — I4891 Unspecified atrial fibrillation: Secondary | ICD-10-CM | POA: Diagnosis not present

## 2019-01-24 DIAGNOSIS — I5033 Acute on chronic diastolic (congestive) heart failure: Secondary | ICD-10-CM | POA: Diagnosis not present

## 2019-01-24 DIAGNOSIS — T424X4S Poisoning by benzodiazepines, undetermined, sequela: Secondary | ICD-10-CM | POA: Diagnosis not present

## 2019-01-24 DIAGNOSIS — Z7951 Long term (current) use of inhaled steroids: Secondary | ICD-10-CM | POA: Diagnosis not present

## 2019-01-24 DIAGNOSIS — Z951 Presence of aortocoronary bypass graft: Secondary | ICD-10-CM | POA: Diagnosis not present

## 2019-01-24 DIAGNOSIS — M1990 Unspecified osteoarthritis, unspecified site: Secondary | ICD-10-CM | POA: Diagnosis not present

## 2019-01-24 DIAGNOSIS — I6523 Occlusion and stenosis of bilateral carotid arteries: Secondary | ICD-10-CM | POA: Diagnosis not present

## 2019-01-24 DIAGNOSIS — J9621 Acute and chronic respiratory failure with hypoxia: Secondary | ICD-10-CM | POA: Diagnosis not present

## 2019-01-24 DIAGNOSIS — G8929 Other chronic pain: Secondary | ICD-10-CM | POA: Diagnosis not present

## 2019-01-24 DIAGNOSIS — I4891 Unspecified atrial fibrillation: Secondary | ICD-10-CM | POA: Diagnosis not present

## 2019-01-24 DIAGNOSIS — D649 Anemia, unspecified: Secondary | ICD-10-CM | POA: Diagnosis not present

## 2019-01-24 DIAGNOSIS — G9341 Metabolic encephalopathy: Secondary | ICD-10-CM | POA: Diagnosis not present

## 2019-01-24 DIAGNOSIS — T40604S Poisoning by unspecified narcotics, undetermined, sequela: Secondary | ICD-10-CM | POA: Diagnosis not present

## 2019-01-24 DIAGNOSIS — Z7982 Long term (current) use of aspirin: Secondary | ICD-10-CM | POA: Diagnosis not present

## 2019-01-24 DIAGNOSIS — Z7901 Long term (current) use of anticoagulants: Secondary | ICD-10-CM | POA: Diagnosis not present

## 2019-01-24 DIAGNOSIS — D631 Anemia in chronic kidney disease: Secondary | ICD-10-CM | POA: Diagnosis not present

## 2019-01-24 DIAGNOSIS — I482 Chronic atrial fibrillation, unspecified: Secondary | ICD-10-CM | POA: Diagnosis not present

## 2019-01-24 DIAGNOSIS — Z9981 Dependence on supplemental oxygen: Secondary | ICD-10-CM | POA: Diagnosis not present

## 2019-01-24 DIAGNOSIS — J439 Emphysema, unspecified: Secondary | ICD-10-CM | POA: Diagnosis not present

## 2019-01-24 DIAGNOSIS — E785 Hyperlipidemia, unspecified: Secondary | ICD-10-CM | POA: Diagnosis not present

## 2019-01-24 DIAGNOSIS — J9602 Acute respiratory failure with hypercapnia: Secondary | ICD-10-CM | POA: Diagnosis not present

## 2019-01-24 DIAGNOSIS — I2510 Atherosclerotic heart disease of native coronary artery without angina pectoris: Secondary | ICD-10-CM | POA: Diagnosis not present

## 2019-01-24 DIAGNOSIS — I110 Hypertensive heart disease with heart failure: Secondary | ICD-10-CM | POA: Diagnosis not present

## 2019-01-24 DIAGNOSIS — E1142 Type 2 diabetes mellitus with diabetic polyneuropathy: Secondary | ICD-10-CM | POA: Diagnosis not present

## 2019-01-24 DIAGNOSIS — Z7984 Long term (current) use of oral hypoglycemic drugs: Secondary | ICD-10-CM | POA: Diagnosis not present

## 2019-01-24 DIAGNOSIS — D509 Iron deficiency anemia, unspecified: Secondary | ICD-10-CM | POA: Diagnosis not present

## 2019-01-24 DIAGNOSIS — M549 Dorsalgia, unspecified: Secondary | ICD-10-CM | POA: Diagnosis not present

## 2019-01-25 DIAGNOSIS — E1142 Type 2 diabetes mellitus with diabetic polyneuropathy: Secondary | ICD-10-CM | POA: Diagnosis not present

## 2019-01-25 DIAGNOSIS — J9602 Acute respiratory failure with hypercapnia: Secondary | ICD-10-CM | POA: Diagnosis not present

## 2019-01-25 DIAGNOSIS — T424X4S Poisoning by benzodiazepines, undetermined, sequela: Secondary | ICD-10-CM | POA: Diagnosis not present

## 2019-01-25 DIAGNOSIS — Z7951 Long term (current) use of inhaled steroids: Secondary | ICD-10-CM | POA: Diagnosis not present

## 2019-01-25 DIAGNOSIS — Z951 Presence of aortocoronary bypass graft: Secondary | ICD-10-CM | POA: Diagnosis not present

## 2019-01-25 DIAGNOSIS — M549 Dorsalgia, unspecified: Secondary | ICD-10-CM | POA: Diagnosis not present

## 2019-01-25 DIAGNOSIS — I5033 Acute on chronic diastolic (congestive) heart failure: Secondary | ICD-10-CM | POA: Diagnosis not present

## 2019-01-25 DIAGNOSIS — E785 Hyperlipidemia, unspecified: Secondary | ICD-10-CM | POA: Diagnosis not present

## 2019-01-25 DIAGNOSIS — J9621 Acute and chronic respiratory failure with hypoxia: Secondary | ICD-10-CM | POA: Diagnosis not present

## 2019-01-25 DIAGNOSIS — M1990 Unspecified osteoarthritis, unspecified site: Secondary | ICD-10-CM | POA: Diagnosis not present

## 2019-01-25 DIAGNOSIS — T40604S Poisoning by unspecified narcotics, undetermined, sequela: Secondary | ICD-10-CM | POA: Diagnosis not present

## 2019-01-25 DIAGNOSIS — D649 Anemia, unspecified: Secondary | ICD-10-CM | POA: Diagnosis not present

## 2019-01-25 DIAGNOSIS — Z7984 Long term (current) use of oral hypoglycemic drugs: Secondary | ICD-10-CM | POA: Diagnosis not present

## 2019-01-25 DIAGNOSIS — Z7982 Long term (current) use of aspirin: Secondary | ICD-10-CM | POA: Diagnosis not present

## 2019-01-25 DIAGNOSIS — I2510 Atherosclerotic heart disease of native coronary artery without angina pectoris: Secondary | ICD-10-CM | POA: Diagnosis not present

## 2019-01-25 DIAGNOSIS — I4891 Unspecified atrial fibrillation: Secondary | ICD-10-CM | POA: Diagnosis not present

## 2019-01-25 DIAGNOSIS — I6523 Occlusion and stenosis of bilateral carotid arteries: Secondary | ICD-10-CM | POA: Diagnosis not present

## 2019-01-25 DIAGNOSIS — Z7901 Long term (current) use of anticoagulants: Secondary | ICD-10-CM | POA: Diagnosis not present

## 2019-01-25 DIAGNOSIS — Z9981 Dependence on supplemental oxygen: Secondary | ICD-10-CM | POA: Diagnosis not present

## 2019-01-25 DIAGNOSIS — G8929 Other chronic pain: Secondary | ICD-10-CM | POA: Diagnosis not present

## 2019-01-25 DIAGNOSIS — I110 Hypertensive heart disease with heart failure: Secondary | ICD-10-CM | POA: Diagnosis not present

## 2019-01-25 DIAGNOSIS — G9341 Metabolic encephalopathy: Secondary | ICD-10-CM | POA: Diagnosis not present

## 2019-01-25 DIAGNOSIS — J439 Emphysema, unspecified: Secondary | ICD-10-CM | POA: Diagnosis not present

## 2019-02-05 DIAGNOSIS — J449 Chronic obstructive pulmonary disease, unspecified: Secondary | ICD-10-CM | POA: Diagnosis not present

## 2019-02-05 DIAGNOSIS — J9611 Chronic respiratory failure with hypoxia: Secondary | ICD-10-CM | POA: Diagnosis not present

## 2019-02-05 DIAGNOSIS — L03119 Cellulitis of unspecified part of limb: Secondary | ICD-10-CM | POA: Diagnosis not present

## 2019-02-05 DIAGNOSIS — I5033 Acute on chronic diastolic (congestive) heart failure: Secondary | ICD-10-CM | POA: Diagnosis not present

## 2019-02-07 DIAGNOSIS — R404 Transient alteration of awareness: Secondary | ICD-10-CM | POA: Diagnosis not present

## 2019-02-24 DEATH — deceased

## 2019-03-10 ENCOUNTER — Ambulatory Visit: Payer: Medicare Other | Admitting: Cardiovascular Disease

## 2019-03-15 ENCOUNTER — Other Ambulatory Visit: Payer: Self-pay | Admitting: *Deleted

## 2019-08-06 IMAGING — DX DG CHEST 2V
2 series · 2 of 2 positions shown · non-contrast
Comparison: 11/07/2018

CLINICAL DATA: Weakness and shortness of breath

EXAM:
CHEST - 2 VIEW

[chest pa]
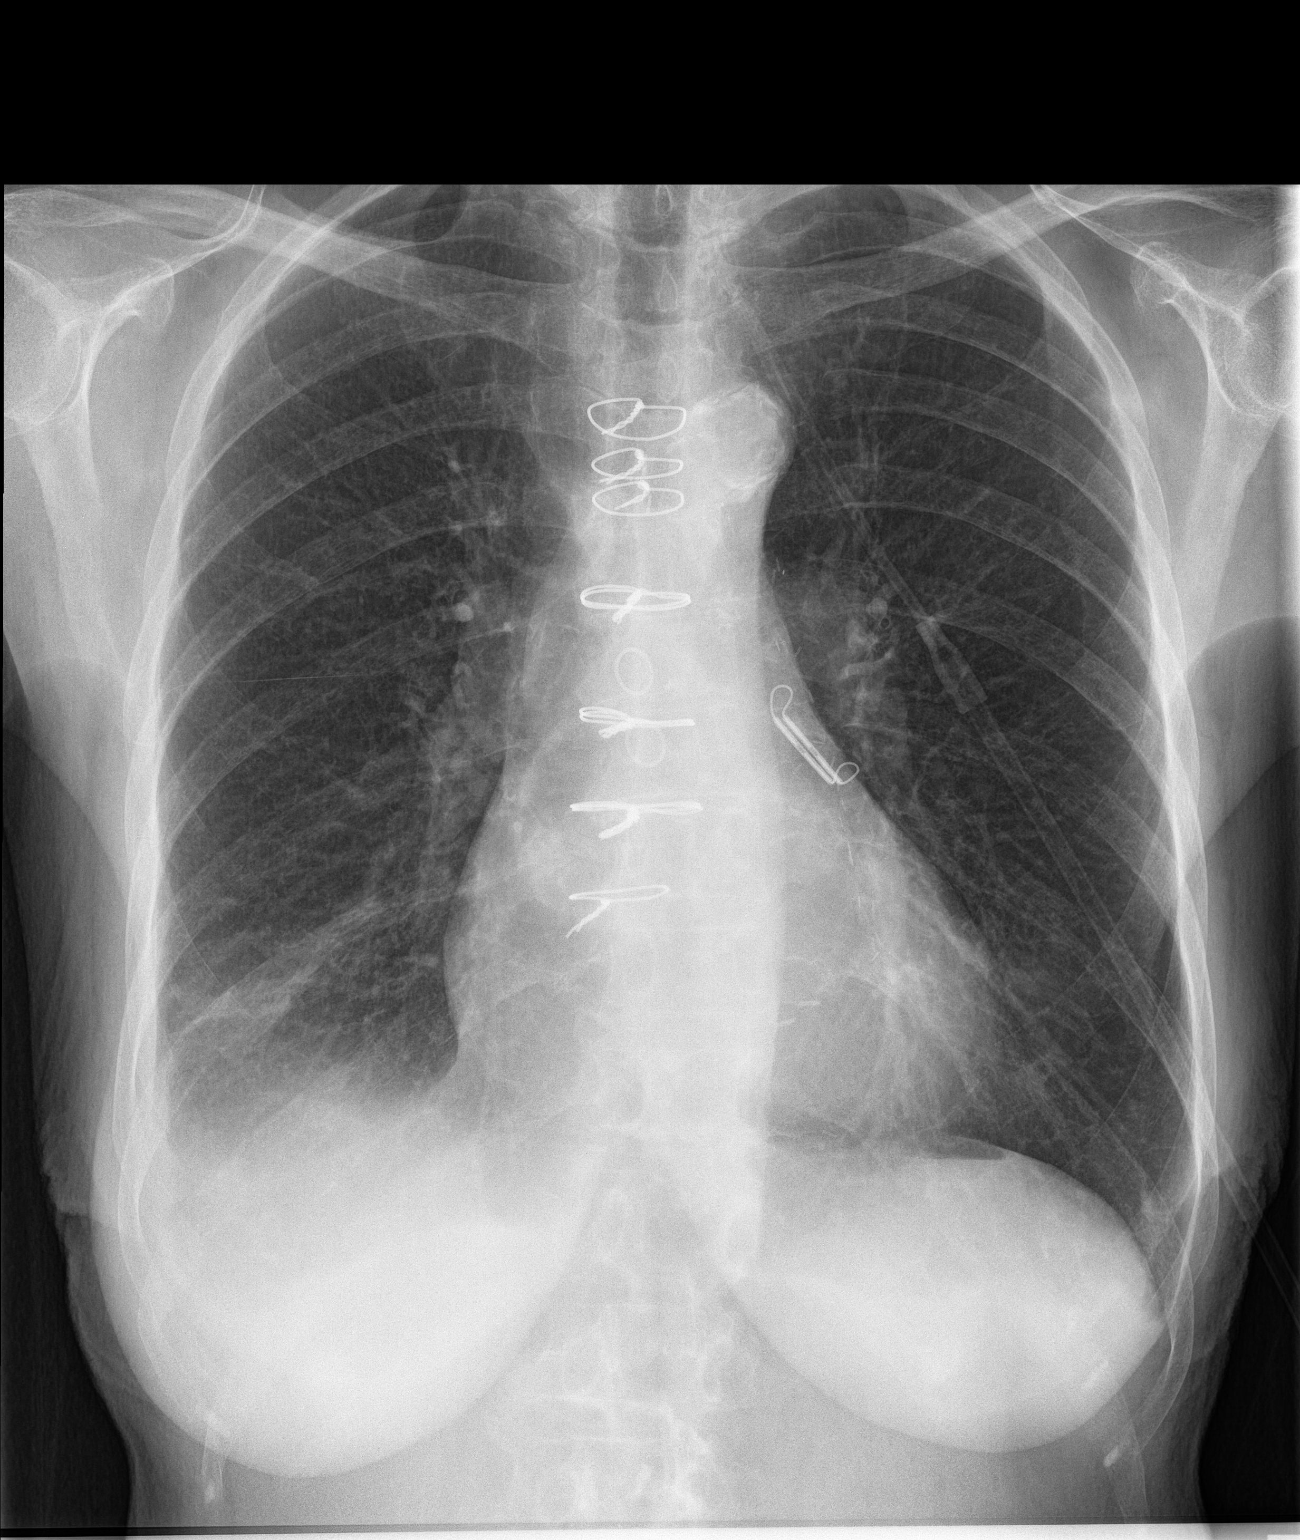

[chest lat]
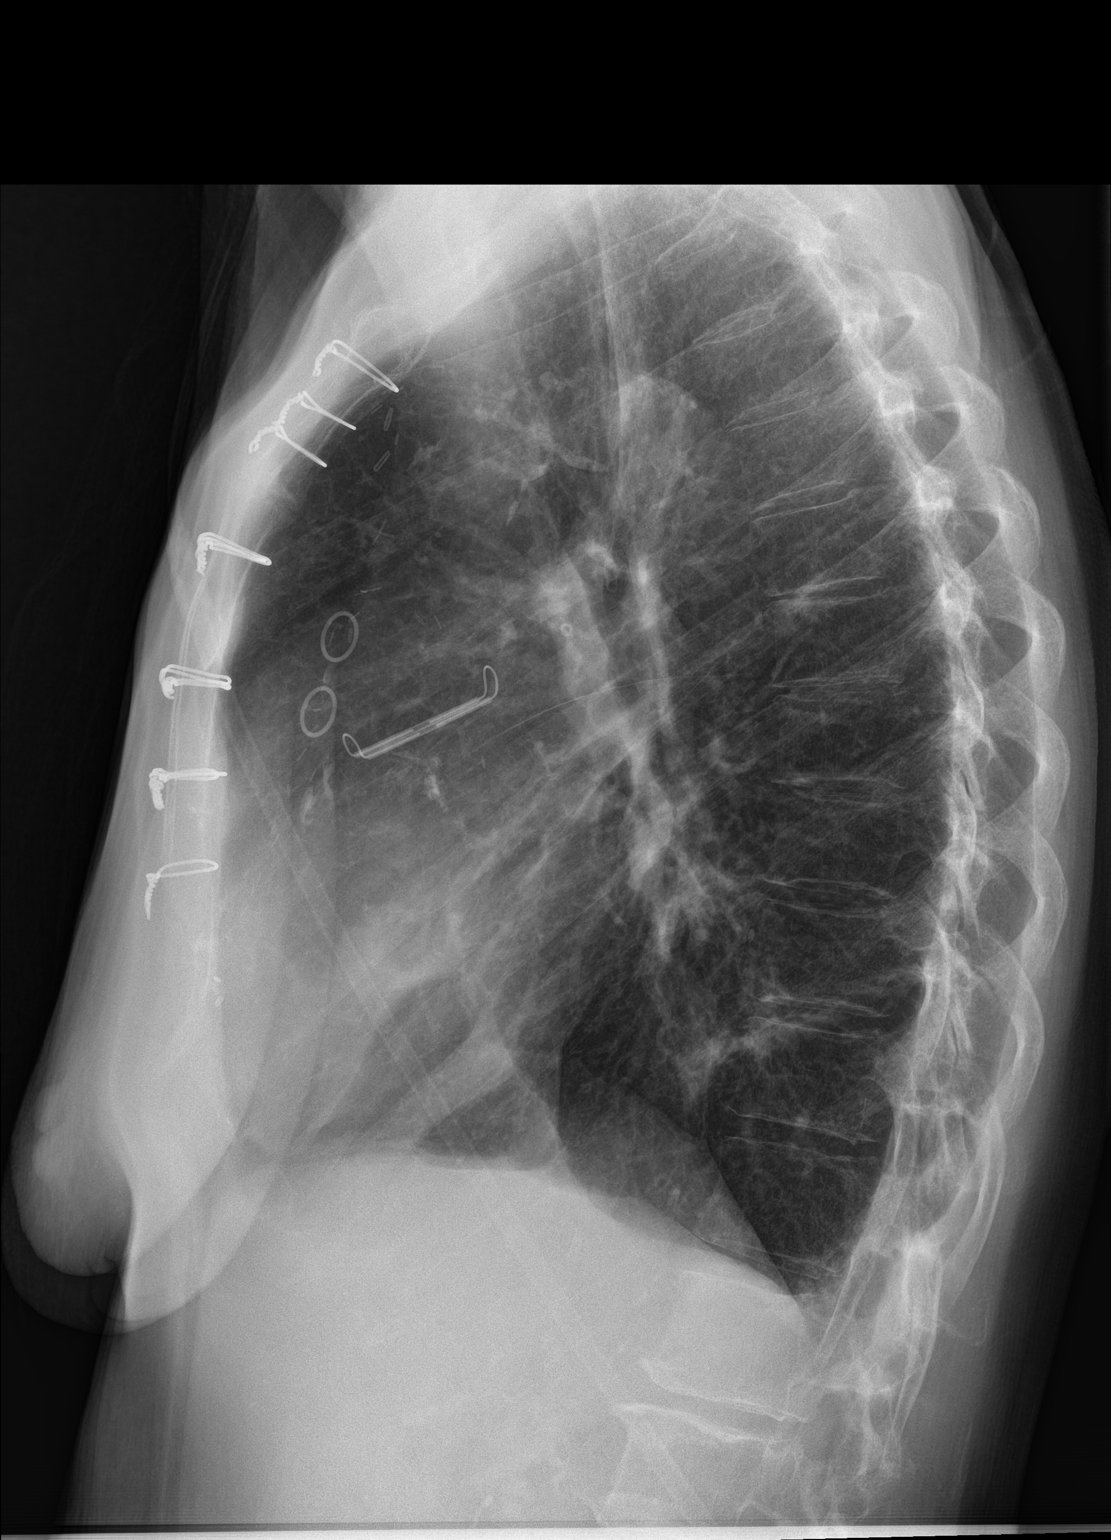

[2 of 2 positions shown; findings below may reference images not displayed]

FINDINGS: Cardiac shadow is within normal limits. Postsurgical changes are
again seen. Near complete resolution of previously seen bibasilar
infiltrates and edema are noted. Some minimal residual effusion is
noted on the right with patchy infiltrate in the right middle lobe.
No bony abnormality is seen. No new infiltrates are seen.
IMPRESSION: Significant improved aeration with mild residual right middle lobe
infiltrate and effusion.
